# Patient Record
Sex: Male | Born: 1945 | Race: White | Hispanic: No | Smoking: Never smoker
Health system: Southern US, Community
[De-identification: ages and names within clinical notes are randomized; demographics above are authoritative.]

## PROBLEM LIST (undated history)

## (undated) DIAGNOSIS — K760 Fatty (change of) liver, not elsewhere classified: Secondary | ICD-10-CM

## (undated) DIAGNOSIS — E785 Hyperlipidemia, unspecified: Secondary | ICD-10-CM

## (undated) DIAGNOSIS — K5790 Diverticulosis of intestine, part unspecified, without perforation or abscess without bleeding: Secondary | ICD-10-CM

## (undated) DIAGNOSIS — K429 Umbilical hernia without obstruction or gangrene: Secondary | ICD-10-CM

## (undated) DIAGNOSIS — Z86010 Personal history of colonic polyps: Secondary | ICD-10-CM

## (undated) DIAGNOSIS — N281 Cyst of kidney, acquired: Secondary | ICD-10-CM

## (undated) DIAGNOSIS — I10 Essential (primary) hypertension: Secondary | ICD-10-CM

## (undated) DIAGNOSIS — M1990 Unspecified osteoarthritis, unspecified site: Secondary | ICD-10-CM

## (undated) DIAGNOSIS — I8390 Asymptomatic varicose veins of unspecified lower extremity: Secondary | ICD-10-CM

## (undated) HISTORY — DX: Umbilical hernia without obstruction or gangrene: K42.9

## (undated) HISTORY — DX: Essential (primary) hypertension: I10

## (undated) HISTORY — PX: APPENDECTOMY: SHX54

## (undated) HISTORY — PX: OTHER SURGICAL HISTORY: SHX169

## (undated) HISTORY — DX: Hyperlipidemia, unspecified: E78.5

## (undated) HISTORY — PX: WRIST SURGERY: SHX841

## (undated) HISTORY — DX: Asymptomatic varicose veins of unspecified lower extremity: I83.90

---

## 2013-06-02 ENCOUNTER — Other Ambulatory Visit: Payer: Self-pay | Admitting: *Deleted

## 2013-06-02 DIAGNOSIS — I83893 Varicose veins of bilateral lower extremities with other complications: Secondary | ICD-10-CM

## 2013-06-09 ENCOUNTER — Encounter: Payer: Self-pay | Admitting: Vascular Surgery

## 2013-07-20 ENCOUNTER — Encounter: Payer: Self-pay | Admitting: Vascular Surgery

## 2013-07-21 ENCOUNTER — Encounter: Payer: Medicare Other | Admitting: Vascular Surgery

## 2013-07-21 ENCOUNTER — Inpatient Hospital Stay (HOSPITAL_COMMUNITY): Admission: RE | Admit: 2013-07-21 | Payer: Medicare Other | Source: Ambulatory Visit

## 2014-03-15 ENCOUNTER — Encounter: Payer: Self-pay | Admitting: Gastroenterology

## 2014-04-11 ENCOUNTER — Ambulatory Visit (INDEPENDENT_AMBULATORY_CARE_PROVIDER_SITE_OTHER): Payer: Medicare Other | Admitting: Gastroenterology

## 2014-04-11 ENCOUNTER — Telehealth: Payer: Self-pay | Admitting: *Deleted

## 2014-04-11 ENCOUNTER — Encounter: Payer: Self-pay | Admitting: Gastroenterology

## 2014-04-11 VITALS — BP 110/76 | HR 88 | Ht 68.0 in | Wt 265.2 lb

## 2014-04-11 DIAGNOSIS — Z1211 Encounter for screening for malignant neoplasm of colon: Secondary | ICD-10-CM

## 2014-04-11 MED ORDER — MOVIPREP 100 G PO SOLR: 1.0000 | Freq: Once | ORAL | Status: DC

## 2014-04-11 NOTE — Progress Notes (Signed)
I agree with the note, plan above.  Colonoscopy for colon cancer screening.

## 2014-04-11 NOTE — Telephone Encounter (Signed)
Error

## 2014-04-11 NOTE — Progress Notes (Signed)
     04/11/2014 Shane Diaz [id number] 06-15-1946   HISTORY OF PRESENT ILLNESS:  This is a pleasant 68 year old male who was referred here by his PCP, Dr. Dagmar Hait, for colonoscopy.  He is from [place] and came to the [employer]. In 1984, but came to [facility] in 2011.  He denies any GI complaints.  Had one episode of constipation a couple of months ago, but that resolved and there have been no other issues since that time.  He has never undergone colonoscopy in the past.  Recent CBC and CMP were unremarkable.   Past Medical History  Diagnosis Date  . Varicose veins   . Hyperlipidemia   . Umbilical hernia    Past Surgical History  Procedure Laterality Date  . Varicose veins    . Wrist surgery      reports that he has never smoked. He has never used smokeless tobacco. He reports that he drinks alcohol. He reports that he does not use illicit drugs. family history is not on file. No Known Allergies    No outpatient encounter prescriptions on file as of 04/11/2014.     REVIEW OF SYSTEMS  : All other systems reviewed and negative except where noted in the History of Present Illness.   PHYSICAL EXAM: BP 110/76  Pulse 88  Ht 5\' 8"  (1.727 m)  Wt 265 lb 3.2 oz (120.294 kg)  BMI 40.33 kg/m2 General: Well developed white male in no acute distress Head: Normocephalic and atraumatic Eyes:  Sclerae anicteric, conjunctiva pink. Ears: Normal auditory acuity  Lungs: Clear throughout to auscultation Heart: Regular rate and rhythm Abdomen: Soft, non-distended.  Normal bowel sounds.  Non-tender.  Non-tender, reducible umbilical hernia noted. Rectal:  Deferred.  Will be done at the time of colonoscopy. Musculoskeletal: Symmetrical with no gross deformities  Skin: No lesions on visible extremities Extremities: No edema  Neurological: Alert oriented x 4, grossly non-focal Psychological:  Alert and cooperative. Normal mood and affect  ASSESSMENT AND PLAN: -Screening colonoscopy:  Will schedule  with Dr. Ardis Hughs.  The risks, benefits, and alternatives were discussed with the patient and he consents to proceed.

## 2014-04-11 NOTE — Addendum Note (Signed)
Addended by: Hope Pigeon A on: 04/11/2014 10:24 AM   Modules accepted: Orders

## 2014-04-11 NOTE — Patient Instructions (Signed)

## 2014-04-11 NOTE — Addendum Note (Signed)
Addended by: Hope Pigeon A on: 04/11/2014 09:53 AM   Modules accepted: Orders

## 2014-05-04 DIAGNOSIS — Z860100 Personal history of colon polyps, unspecified: Secondary | ICD-10-CM

## 2014-05-04 DIAGNOSIS — N281 Cyst of kidney, acquired: Secondary | ICD-10-CM

## 2014-05-04 HISTORY — DX: Personal history of colonic polyps: Z86.010

## 2014-05-04 HISTORY — DX: Cyst of kidney, acquired: N28.1

## 2014-05-04 HISTORY — DX: Personal history of colon polyps, unspecified: Z86.0100

## 2014-05-23 ENCOUNTER — Ambulatory Visit (AMBULATORY_SURGERY_CENTER): Payer: Medicare Other | Admitting: Gastroenterology

## 2014-05-23 ENCOUNTER — Encounter: Payer: Self-pay | Admitting: Gastroenterology

## 2014-05-23 VITALS — BP 162/103 | HR 78 | Temp 97.1°F | Resp 18 | Ht 68.0 in | Wt 265.0 lb

## 2014-05-23 DIAGNOSIS — D125 Benign neoplasm of sigmoid colon: Secondary | ICD-10-CM

## 2014-05-23 DIAGNOSIS — D122 Benign neoplasm of ascending colon: Secondary | ICD-10-CM

## 2014-05-23 DIAGNOSIS — Z1211 Encounter for screening for malignant neoplasm of colon: Secondary | ICD-10-CM

## 2014-05-23 HISTORY — PX: COLONOSCOPY W/ POLYPECTOMY: SHX1380

## 2014-05-23 MED ORDER — SODIUM CHLORIDE 0.9 % IV SOLN: 500.0000 mL | INTRAVENOUS | Status: DC

## 2014-05-23 NOTE — Op Note (Signed)
[facility]  [address] [place], [address]   COLONOSCOPY PROCEDURE REPORT  PATIENT: Shane Diaz, Shane Diaz  MR#: [id number] BIRTHDATE: 09/21/1945 , 68  yrs. old GENDER: male ENDOSCOPIST: Milus Banister, MD REFERRED OZ:[id number] Avva, M.D. PROCEDURE DATE:  05/23/2014 PROCEDURE:   Colonoscopy with snare polypectomy First Screening Colonoscopy - Avg.  risk and is 50 yrs.  old or older Yes.  Prior Negative Screening - Now for repeat screening. N/A  History of Adenoma - Now for follow-up colonoscopy & has been > or = to 3 yrs.  N/A  Polyps Removed Today? Yes. ASA CLASS:   Class II INDICATIONS:average risk for colon cancer. MEDICATIONS: Monitored anesthesia care and Propofol 230 mg IV  DESCRIPTION OF PROCEDURE:   After the risks benefits and alternatives of the procedure were thoroughly explained, informed consent was obtained.  The digital rectal exam revealed no abnormalities of the rectum.   The LB PFC-H190 [id number] and LB PFC-H190 [id number]  endoscope was introduced through the anus and advanced to the cecum, which was identified by both the appendix and ileocecal valve. No adverse events experienced.   The quality of the prep was excellent.  The instrument was then slowly withdrawn as the colon was fully examined.  COLON FINDINGS: A sessile polyp measuring 4 mm in size was found in the ascending colon.  A polypectomy was performed with a cold snare.  The resection was complete, the polyp tissue was completely retrieved and sent to histology (jar 1).   A pedunculated polyp measuring 15 mm in size was found in the sigmoid colon.  A polypectomy was performed using snare cautery.  The resection was complete, the polyp tissue was completely retrieved and sent to histology (jar 2).   There was mild diverticulosis noted in the left colon.   The examination was otherwise normal.  Retroflexed views revealed no abnormalities. The time to cecum=3 minutes 14 seconds.  Withdrawal  time=8 minutes 26 seconds.  The scope was withdrawn and the procedure completed. COMPLICATIONS: There were no immediate complications.  ENDOSCOPIC IMPRESSION: 1.   Sessile polyp was found in the ascending colon; polypectomy was performed with a cold snare 2.   Pedunculated polyp was found in the sigmoid colon; polypectomy was performed using snare cautery 3.   Mild diverticulosis was noted in the left colon 4.   The examination was otherwise normal  RECOMMENDATIONS: If the polyp(s) removed today are proven to be adenomatous (pre-cancerous) polyps, you will need a colonoscopy in 3 years. Otherwise you should continue to follow colorectal cancer screening guidelines for "routine risk" patients with a colonoscopy in 10 years.  You will receive a letter within 1-2 weeks with the results of your biopsy as well as final recommendations.  Please call my office if you have not received a letter after 3 weeks.  eSigned:  Milus Banister, MD 05/23/2014 3:37 PM

## 2014-05-23 NOTE — Progress Notes (Signed)
Procedure ends, to recovery, report given and VSS. 

## 2014-05-23 NOTE — Progress Notes (Signed)
Called to room to assist during endoscopic procedure.  Patient ID and intended procedure confirmed with present staff. Received instructions for my participation in the procedure from the performing physician.  

## 2014-05-23 NOTE — Patient Instructions (Signed)

## 2014-05-24 ENCOUNTER — Telehealth: Payer: Self-pay | Admitting: *Deleted

## 2014-05-24 NOTE — Telephone Encounter (Signed)
  Follow up Call-  Call back number 05/23/2014  Post procedure Call Back phone  # [phone] cell  Permission to leave phone message Yes     Patient questions:  Do you have a fever, pain , or abdominal swelling? No. Pain Score  0 *  Have you tolerated food without any problems? Yes.    Have you been able to return to your normal activities? Yes.    Do you have any questions about your discharge instructions: Diet   No. Medications  No. Follow up visit  No.  Do you have questions or concerns about your Care? No.  Actions: * If pain score is 4 or above: No action needed, pain <4.

## 2014-06-05 ENCOUNTER — Encounter: Payer: Self-pay | Admitting: Gastroenterology

## 2014-06-15 ENCOUNTER — Other Ambulatory Visit (HOSPITAL_COMMUNITY): Payer: Self-pay | Admitting: Internal Medicine

## 2014-06-15 ENCOUNTER — Ambulatory Visit (HOSPITAL_COMMUNITY)
Admission: RE | Admit: 2014-06-15 | Discharge: 2014-06-15 | Disposition: A | Discharge status: Home / Self Care | Payer: Medicare Other | Source: Ambulatory Visit | Attending: Internal Medicine | Admitting: Internal Medicine

## 2014-06-15 DIAGNOSIS — I803 Phlebitis and thrombophlebitis of lower extremities, unspecified: Secondary | ICD-10-CM | POA: Insufficient documentation

## 2014-06-15 DIAGNOSIS — M79661 Pain in right lower leg: Secondary | ICD-10-CM

## 2014-06-15 DIAGNOSIS — M7989 Other specified soft tissue disorders: Principal | ICD-10-CM

## 2014-06-15 DIAGNOSIS — I80209 Phlebitis and thrombophlebitis of unspecified deep vessels of unspecified lower extremity: Secondary | ICD-10-CM

## 2014-06-15 NOTE — Progress Notes (Signed)
VASCULAR LAB PRELIMINARY  PRELIMINARY  PRELIMINARY  PRELIMINARY  Bilateral lower extremity venous duplex  completed.    Preliminary report:  Right:  DVT noted in the distal FV, popliteal vein, PTV, and peroneal vein.  No evidence of superficial thrombosis.  No Baker's cyst.  Left:  No evidence of DVT, superficial thrombosis, or Baker's cyst.  Aiyla Baucom, RVT 06/15/2014, 3:15 PM

## 2015-02-08 ENCOUNTER — Other Ambulatory Visit: Payer: Self-pay | Admitting: *Deleted

## 2015-02-08 ENCOUNTER — Encounter: Payer: Self-pay | Admitting: Vascular Surgery

## 2015-02-08 DIAGNOSIS — I83893 Varicose veins of bilateral lower extremities with other complications: Secondary | ICD-10-CM

## 2015-02-22 ENCOUNTER — Encounter: Payer: Self-pay | Admitting: Vascular Surgery

## 2015-02-26 ENCOUNTER — Encounter: Payer: Self-pay | Admitting: Vascular Surgery

## 2015-02-26 ENCOUNTER — Ambulatory Visit (INDEPENDENT_AMBULATORY_CARE_PROVIDER_SITE_OTHER): Payer: Medicare Other | Admitting: Vascular Surgery

## 2015-02-26 ENCOUNTER — Ambulatory Visit (HOSPITAL_COMMUNITY)
Admission: RE | Admit: 2015-02-26 | Discharge: 2015-02-26 | Disposition: A | Discharge status: Home / Self Care | Payer: Medicare Other | Source: Ambulatory Visit | Attending: Vascular Surgery | Admitting: Vascular Surgery

## 2015-02-26 VITALS — BP 130/80 | HR 89 | Temp 97.5°F | Resp 18 | Ht 68.0 in | Wt 273.0 lb

## 2015-02-26 DIAGNOSIS — I83893 Varicose veins of bilateral lower extremities with other complications: Secondary | ICD-10-CM | POA: Insufficient documentation

## 2015-02-26 DIAGNOSIS — I83892 Varicose veins of left lower extremities with other complications: Secondary | ICD-10-CM

## 2015-02-26 NOTE — Progress Notes (Signed)
Subjective:     Patient ID: Shane Diaz, male   DOB: 03/18/1946, 69 y.o.   MRN: [id number]  HPI this 69 year old male is evaluated for varicose veins in both lower extremities with a recent episode of spontaneous bleeding in the left leg. This occurred about 4 months ago. He was in the bathroom when he spontaneously developed spurting blood from the left lateral ankle area. This did stop with elevation and compression. He has swelling in both ankles and feet as the day progresses left worse than right. He has no history of DVT thrombophlebitis stasis ulcers or pulmonary embolus. He does not wear elastic compression stockings.  Past Medical History  Diagnosis Date  . Varicose veins   . Umbilical hernia   . Hypertension   . Hyperlipidemia     pt denies    History  Substance Use Topics  . Smoking status: Never Smoker   . Smokeless tobacco: Never Used  . Alcohol Use: 0.0 oz/week    0 Standard drinks or equivalent per week     Comment: occassional wine, martini    Family History  Problem Relation Age of Onset  . Colon cancer Neg Hx   . Esophageal cancer Neg Hx   . Pancreatic cancer Neg Hx   . Prostate cancer Neg Hx   . Rectal cancer Neg Hx   . Stomach cancer Neg Hx     No Known Allergies   Current outpatient prescriptions:  .  olmesartan (BENICAR) 20 MG tablet, Take 20 mg by mouth daily., Disp: , Rfl:   Filed Vitals:   02/26/15 0958  BP: 130/80  Pulse: 89  Temp: 97.5 F (36.4 C)  Resp: 18  Height: 5\' 8"  (1.727 m)  Weight: 273 lb (123.832 kg)  SpO2: 95%    Body mass index is 41.52 kg/(m^2).           Review of Systems denies chest pain but does have mild dyspnea on exertion. Has chronic edema both legs. Other systems negative and complete review of systems     Objective:   Physical Exam BP 130/80 mmHg  Pulse 89  Temp(Src) 97.5 F (36.4 C)  Resp 18  Ht 5\' 8"  (1.727 m)  Wt 273 lb (123.832 kg)  BMI 41.52 kg/m2  SpO2 95%  Gen.-alert and oriented x3  in no apparent distress-obese HEENT normal for age Lungs no rhonchi or wheezing Cardiovascular regular rhythm no murmurs carotid pulses 3+ palpable no bruits audible Abdomen soft nontender no palpable masses Musculoskeletal free of  major deformities Skin clear -no rashes Neurologic normal Lower extremities 3+ femoral and dorsalis pedis pulses palpable bilaterally with 1+ edema bilaterally Diffuse reticular veins in both lower extremities lower third of leg medial and lateral malleolar area. Right leg has a few bulging varicosities in the medial calf and posterior calf. Early hyperpigmentation bilaterally with no active ulcer.  Today I ordered bilateral venous duplex exam Gen. reviewed and interpreted. There is no DVT. Left leg has gross reflux throughout the great saphenous vein with a large caliber vein. Right leg has no significant reflux in the great saphenous vein which was not visualized in the upper third of the thigh.       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 Patient has chronic edema which is affecting his daily living.    Plan:         #1 long leg elastic  compression stockings 20-30 mm gradient #2 elevate legs as much as possible #3 ibuprofen daily on a regular basis for pain #4 return in 3 months-if no significant improvement then he will need laser ablation left great saphenous vein plus sclerotherapy of the bleeding site If he has any further bleeding episodes he will notify us and we will proceed without the three-month waiting.

## 2015-02-27 NOTE — Progress Notes (Signed)
Pt came in on 7/26 insisting the compression stockings that I fit him for yesterday were too tight. I told him that they needed to be tight in order to provide the support that was necessary. I fit him in a large and am worried that the XL will fall down his leg. The patient could not be reasoned with. Even though we are not supposed to exchange stockings, I gave him a size XL because I knew he would make a scene if I told him the stockings would cost an additional $50. We will use the size larges for someone who can't afford stockings.

## 2015-05-05 DIAGNOSIS — K5790 Diverticulosis of intestine, part unspecified, without perforation or abscess without bleeding: Secondary | ICD-10-CM

## 2015-05-05 HISTORY — DX: Diverticulosis of intestine, part unspecified, without perforation or abscess without bleeding: K57.90

## 2015-05-08 ENCOUNTER — Other Ambulatory Visit: Payer: Self-pay | Admitting: Internal Medicine

## 2015-05-08 DIAGNOSIS — R945 Abnormal results of liver function studies: Secondary | ICD-10-CM

## 2015-05-08 DIAGNOSIS — R7989 Other specified abnormal findings of blood chemistry: Secondary | ICD-10-CM

## 2015-05-14 ENCOUNTER — Ambulatory Visit
Admission: RE | Admit: 2015-05-14 | Discharge: 2015-05-14 | Disposition: A | Discharge status: Home / Self Care | Payer: Medicare Other | Source: Ambulatory Visit | Attending: Internal Medicine | Admitting: Internal Medicine

## 2015-05-14 DIAGNOSIS — R7989 Other specified abnormal findings of blood chemistry: Secondary | ICD-10-CM

## 2015-05-14 DIAGNOSIS — R945 Abnormal results of liver function studies: Secondary | ICD-10-CM

## 2015-05-14 DIAGNOSIS — K760 Fatty (change of) liver, not elsewhere classified: Secondary | ICD-10-CM

## 2015-05-14 HISTORY — DX: Fatty (change of) liver, not elsewhere classified: K76.0

## 2015-05-25 ENCOUNTER — Encounter: Payer: Self-pay | Admitting: Vascular Surgery

## 2015-05-29 ENCOUNTER — Encounter: Payer: Self-pay | Admitting: Vascular Surgery

## 2015-05-29 ENCOUNTER — Ambulatory Visit (INDEPENDENT_AMBULATORY_CARE_PROVIDER_SITE_OTHER): Payer: Medicare Other | Admitting: Vascular Surgery

## 2015-05-29 VITALS — BP 136/84 | HR 85 | Ht 68.0 in | Wt 266.0 lb

## 2015-05-29 DIAGNOSIS — I83893 Varicose veins of bilateral lower extremities with other complications: Secondary | ICD-10-CM

## 2015-05-29 NOTE — Progress Notes (Signed)
Subjective:     Patient ID: Shane Diaz, male   DOB: 1946-08-03, 69 y.o.   MRN: [id number]  HPI this 69 year old male returns for continued follow-up regarding his severe venous disease bilaterally. He has had an episode of spontaneous bleeding in the left leg which resolved. He has pain occluding aching throbbing and burning discomfort in both legs which worsens as the day progresses. He has tried long-leg elastic compression stockings 20-30 millimeter gradient as well as elevation ibuprofen with no improvement. He has noted some darkness of the skin the lower third of both legs as well as the edema.  Past Medical History  Diagnosis Date  . Varicose veins   . Umbilical hernia   . Hypertension   . Hyperlipidemia     pt denies    Social History  Substance Use Topics  . Smoking status: Never Smoker   . Smokeless tobacco: Never Used  . Alcohol Use: 0.0 oz/week    0 Standard drinks or equivalent per week     Comment: occassional wine, martini    Family History  Problem Relation Age of Onset  . Colon cancer Neg Hx   . Esophageal cancer Neg Hx   . Pancreatic cancer Neg Hx   . Prostate cancer Neg Hx   . Rectal cancer Neg Hx   . Stomach cancer Neg Hx     No Known Allergies   Current outpatient prescriptions:  .  olmesartan (BENICAR) 20 MG tablet, Take 20 mg by mouth daily., Disp: , Rfl:   Filed Vitals:   05/29/15 1009  BP: 136/84  Pulse: 85  Height: 5\' 8"  (1.727 m)  Weight: 266 lb (120.657 kg)  SpO2: 95%    Body mass index is 40.45 kg/(m^2).           Review of Systems denies chest pain, dyspnea on exertion, PND, orthopnea, hemoptysis     Objective:   Physical Exam BP 136/84 mmHg  Pulse 85  Ht 5\' 8"  (1.727 m)  Wt 266 lb (120.657 kg)  BMI 40.45 kg/m2  SpO2 95%  Gen. well-developed well-nourished male in no apparent distress alert and oriented 3 Lungs no rhonchi or wheezing Left leg with chronic 1+ edema and early hyperpigmentation distally. Superficial  reticular veins over great saphenous system and small varicosities were bleeding occurred in mid calf Right leg with bulging varicosities in posterior thigh and calf with 1+ edema. 3  Patient has had previous stripping of right great saphenous vein. He has documented gross reflux throughout left great saphenous vein supplying the area of bleeding     Assessment:     Painful varicosities with episode of bleeding left leg and continued pain right calf. Symptoms are resistant to conservative measures including long-leg elastic compression stockings, elevation, and ibuprofen.    Plan:     Patient needs number-one laser ablation left great saphenous vein plus sclerotherapy of bleeding site #2 greater than 20 stab phlebectomy of painful varicosities right leg We'll proceed with precertification to perform this in the near future

## 2015-06-05 ENCOUNTER — Other Ambulatory Visit: Payer: Self-pay | Admitting: *Deleted

## 2015-06-05 DIAGNOSIS — I83812 Varicose veins of left lower extremities with pain: Secondary | ICD-10-CM

## 2015-06-14 ENCOUNTER — Encounter: Payer: Self-pay | Admitting: Vascular Surgery

## 2015-06-18 ENCOUNTER — Encounter: Payer: Self-pay | Admitting: Vascular Surgery

## 2015-06-18 ENCOUNTER — Ambulatory Visit (INDEPENDENT_AMBULATORY_CARE_PROVIDER_SITE_OTHER): Payer: Medicare Other | Admitting: Vascular Surgery

## 2015-06-18 VITALS — BP 129/84 | HR 91 | Temp 97.6°F | Resp 16 | Ht 68.0 in | Wt 260.0 lb

## 2015-06-18 DIAGNOSIS — I83892 Varicose veins of left lower extremities with other complications: Secondary | ICD-10-CM

## 2015-06-18 NOTE — Progress Notes (Signed)
Subjective:     Patient ID: Shane Diaz, male   DOB: 05-25-1946, 69 y.o.   MRN: [id number]  HPI This 69 year old male had laser ablation of left great saphenous vein from the distal thigh to near the saphenofemoral junction performed under local tumescent anesthesia. A total of 1700 J of energy was utilized. He tolerated the procedure well.  Review of Systems     Objective:   Physical Exam BP 129/84 mmHg  Pulse 91  Temp(Src) 97.6 F (36.4 C)  Resp 16  Ht 5\' 8"  (1.727 m)  Wt 260 lb (117.935 kg)  BMI 39.54 kg/m2  SpO2 99%       Assessment:     Well-tolerated laser ablation left great saphenous vein performed under local tumescent anesthesia for gross reflux with pain and swelling    Plan:     Return in 1 week for venous duplex exam to confirm closure left great saphenous vein

## 2015-06-18 NOTE — Progress Notes (Signed)
Laser Ablation Procedure    Date: 06/18/2015   Shane Diaz DOB:June 27, 1946  Consent signed: Yes    Surgeon:  Dr. Nelda Severe. Kellie Simmering  Procedure: Laser Ablation: left Greater Saphenous Vein  BP 129/84 mmHg  Pulse 91  Temp(Src) 97.6 F (36.4 C)  Resp 16  Ht 5\' 8"  (1.727 m)  Wt 260 lb (117.935 kg)  BMI 39.54 kg/m2  SpO2 99%  Tumescent Anesthesia: 300 cc 0.9% NaCl with 50 cc Lidocaine HCL with 1% Epi and 15 cc 8.4% NaHCO3  Local Anesthesia: 4 cc Lidocaine HCL and NaHCO3 (ratio 2:1)  Pulsed Mode: 15 watts, 532ms delay, 1.0 duration  Total Energy:1751              Total Pulses:   117             Total Time: 1:57    Patient tolerated procedure well  Notes:   Description of Procedure:  After marking the course of the secondary varicosities, the patient was placed on the operating table in the supine position, and the left leg was prepped and draped in sterile fashion.   Local anesthetic was administered and under ultrasound guidance the saphenous vein was accessed with a micro needle and guide wire; then the mirco puncture sheath was placed.  A guide wire was inserted saphenofemoral junction , followed by a 5 french sheath.  The position of the sheath and then the laser fiber below the junction was confirmed using the ultrasound.  Tumescent anesthesia was administered along the course of the saphenous vein using ultrasound guidance. The patient was placed in Trendelenburg position and protective laser glasses were placed on patient and staff, and the laser was fired at 15 watts continuous mode advancing 1-69mm/second for a total of 1751 joules.     Steri strips were applied to the stab wounds and ABD pads and thigh high compression stockings were applied.  Ace wrap bandages were applied over the phlebectomy sites and at the top of the saphenofemoral junction. Blood loss was less than 15 cc.  The patient ambulated out of the operating room having tolerated the procedure well.

## 2015-06-19 ENCOUNTER — Telehealth: Payer: Self-pay | Admitting: *Deleted

## 2015-06-19 NOTE — Telephone Encounter (Signed)
Pt doing well. Not having any pain. Following all instructions.

## 2015-06-21 ENCOUNTER — Encounter: Payer: Self-pay | Admitting: Vascular Surgery

## 2015-06-25 ENCOUNTER — Encounter: Payer: Self-pay | Admitting: Vascular Surgery

## 2015-06-25 ENCOUNTER — Ambulatory Visit (HOSPITAL_COMMUNITY)
Admission: RE | Admit: 2015-06-25 | Discharge: 2015-06-25 | Disposition: A | Discharge status: Home / Self Care | Payer: Medicare Other | Source: Ambulatory Visit | Attending: Vascular Surgery | Admitting: Vascular Surgery

## 2015-06-25 ENCOUNTER — Ambulatory Visit (INDEPENDENT_AMBULATORY_CARE_PROVIDER_SITE_OTHER): Payer: Medicare Other | Admitting: Vascular Surgery

## 2015-06-25 VITALS — BP 127/80 | HR 87 | Ht 68.0 in | Wt 266.0 lb

## 2015-06-25 DIAGNOSIS — I83892 Varicose veins of left lower extremities with other complications: Secondary | ICD-10-CM | POA: Diagnosis not present

## 2015-06-25 DIAGNOSIS — I83812 Varicose veins of left lower extremities with pain: Secondary | ICD-10-CM | POA: Diagnosis present

## 2015-06-25 DIAGNOSIS — I83899 Varicose veins of unspecified lower extremities with other complications: Secondary | ICD-10-CM | POA: Insufficient documentation

## 2015-06-25 NOTE — Progress Notes (Signed)
Subjective:     Patient ID: Shane Diaz, male   DOB: [birth year], 69 y.o.   MRN: [id number]  HPI this 69 year old male returns 1 week post-laser ablation left great saphenous vein for gross reflux with pain and swelling. He has had some mild-to-moderate discomfort in the mid to proximal medial thigh where the great saphenous vein is located. This is resolving. He has had some itching in this area. He has had no distal edema since the procedure. He denies any chest pain.  Past Medical History  Diagnosis Date  . Varicose veins   . Umbilical hernia   . Hypertension   . Hyperlipidemia     pt denies    Social History  Substance Use Topics  . Smoking status: Never Smoker   . Smokeless tobacco: Never Used  . Alcohol Use: 0.0 oz/week    0 Standard drinks or equivalent per week     Comment: occassional wine, martini    Family History  Problem Relation Age of Onset  . Colon cancer Neg Hx   . Esophageal cancer Neg Hx   . Pancreatic cancer Neg Hx   . Prostate cancer Neg Hx   . Rectal cancer Neg Hx   . Stomach cancer Neg Hx     No Known Allergies   Current outpatient prescriptions:  .  olmesartan (BENICAR) 20 MG tablet, Take 20 mg by mouth daily., Disp: , Rfl:   Filed Vitals:   06/25/15 0946  BP: 127/80  Pulse: 87  Height: 5\' 8"  (1.727 m)  Weight: 266 lb (120.657 kg)  SpO2: 94%    Body mass index is 40.45 kg/(m^2).           Review of Systems denies chest pain, dyspnea on exertion, PND, orthopnea, claudication, hemoptysis.     Objective:   Physical Exam BP 127/80 mmHg  Pulse 87  Ht 5\' 8"  (1.727 m)  Wt 266 lb (120.657 kg)  BMI 40.45 kg/m2  SpO2 94%  Gen. well-developed well-nourished male in no apparent distress alert and oriented 3 Lungs no rhonchi or wheezing Cardiovascular regular rhythm no murmurs Left leg with mild discomfort to deep palpation of her great saphenous vein from lower third of the thigh to near the saphenofemoral junction. Prominent  reticular veins and spider veins in distal thigh and medial calf area. No hyperpigmentation or ulceration noted. 3+ dorsalis pedis pulse palpable.  Today I ordered a venous duplex exam of the left leg which I reviewed and interpreted. There is no DVT. The left great saphenous vein is totally closed from the distal thigh to near the saphenofemoral junction.       Assessment:     Successful closure left great saphenous vein for gross reflux with history of bleeding and lower thigh     Plan:     Patient to return soon for 1 course of sclerotherapy left distal thigh Patient to return next week for multiple stab phlebectomy of painful varicosities right leg and thigh and calf area.

## 2015-06-26 ENCOUNTER — Encounter: Payer: Self-pay | Admitting: Vascular Surgery

## 2015-07-02 ENCOUNTER — Encounter: Payer: Self-pay | Admitting: Vascular Surgery

## 2015-07-02 ENCOUNTER — Other Ambulatory Visit: Payer: Medicare Other | Admitting: Vascular Surgery

## 2015-07-02 ENCOUNTER — Ambulatory Visit (INDEPENDENT_AMBULATORY_CARE_PROVIDER_SITE_OTHER): Payer: Medicare Other | Admitting: Vascular Surgery

## 2015-07-02 VITALS — BP 130/80 | HR 71 | Temp 97.6°F | Resp 16 | Ht 68.0 in | Wt 259.0 lb

## 2015-07-02 DIAGNOSIS — I83893 Varicose veins of bilateral lower extremities with other complications: Secondary | ICD-10-CM

## 2015-07-02 NOTE — Progress Notes (Signed)
    Stab Phlebectomy Procedure  Eastyn Evanoff DOB:1945-10-18  07/02/2015  Consent signed: Yes  Surgeon:J.D. Kellie Simmering  Procedure: stab phlebectomy: right leg  BP 130/80 mmHg  Pulse 71  Temp(Src) 97.6 F (36.4 C)  Resp 16  Ht 5\' 8"  (1.727 m)  Wt 259 lb (117.482 kg)  BMI 39.39 kg/m2  SpO2 98%  Start time: 11:15   End time: 12:15   Tumescent Anesthesia: 175 cc 0.9% NaCl with 50 cc Lidocaine HCL with 1% Epi and 15 cc 8.4% NaHCO3  Local Anesthesia: 7 cc Lidocaine HCL and NaHCO3 (ratio 2:1)    Stab Phlebectomy: >20 Sites: Thigh, Calf and Ankle  Patient tolerated procedure well: Yes  Notes:   Description of Procedure:  After marking the course of the secondary varicosities, the patient was placed on the operating table in the supine position, and the right leg was prepped and draped in sterile fashion.    The patient was then put into Trendelenburg position.  Local anesthetic was administered at the previously marked varicosities, and tumescent anesthesia was administered around the vessels.  Greater than 20 stab wounds were made using the tip of an 11 blade. And using the vein hook, the phlebectomies were performed using a hemostat to avulse the varicosities.  Adequate hemostasis was achieved, and steri strips were applied to the stab wound.      ABD pads and thigh high compression stockings were applied as well ace wraps where needed. Blood loss was less than 15 cc.  The patient ambulated out of the operating room having tolerated the procedure well.

## 2015-07-02 NOTE — Progress Notes (Signed)
Subjective:     Patient ID: Shane Diaz, male   DOB: [birth year], 69 y.o.   MRN: [id number]  HPI  This 69 year old male had multiple stab phlebectomy-greater than 20 of painful varicosities in the right thigh and calf area performed under local tumescent anesthesia. He tolerated the procedure well.  Review of Systems     Objective:   Physical Exam BP 130/80 mmHg  Pulse 71  Temp(Src) 97.6 F (36.4 C)  Resp 16  Ht 5\' 8"  (1.727 m)  Wt 259 lb (117.482 kg)  BMI 39.39 kg/m2  SpO2 98%       Assessment:       Well tolerated multiple stab phlebectomy of painful varicosities right leg performed under local tumescent anesthesia     Plan:      patient will return in a few weeks for sclerotherapy to conclude his treatment regimen

## 2015-07-03 ENCOUNTER — Telehealth: Payer: Self-pay | Admitting: *Deleted

## 2015-07-03 NOTE — Telephone Encounter (Signed)
Pt doing well. No bleeding. Following all instructions. Will see him for sclero in a few weeks.

## 2015-07-09 ENCOUNTER — Ambulatory Visit: Payer: Medicare Other | Admitting: Vascular Surgery

## 2015-07-09 ENCOUNTER — Encounter (HOSPITAL_COMMUNITY): Payer: Medicare Other

## 2015-07-20 ENCOUNTER — Encounter: Payer: Self-pay | Admitting: *Deleted

## 2015-07-25 ENCOUNTER — Ambulatory Visit (INDEPENDENT_AMBULATORY_CARE_PROVIDER_SITE_OTHER): Payer: Medicare Other | Admitting: *Deleted

## 2015-07-25 DIAGNOSIS — I83893 Varicose veins of bilateral lower extremities with other complications: Secondary | ICD-10-CM

## 2015-07-25 NOTE — Progress Notes (Signed)
X=.3% 12 Sotradecol administered with a 27g butterfly.  Patient received a total of 6cc.  Treated all areas of concern for this pt who has had bleeding from his spider veins. Easy access. Tol well. Will follow prn.  Photos: No.  Compression stockings applied: Yes.

## 2015-07-26 ENCOUNTER — Encounter: Payer: Self-pay | Admitting: Vascular Surgery

## 2015-09-05 ENCOUNTER — Encounter: Payer: Self-pay | Admitting: Internal Medicine

## 2017-06-08 ENCOUNTER — Encounter: Payer: Self-pay | Admitting: Gastroenterology

## 2018-01-25 ENCOUNTER — Other Ambulatory Visit: Payer: Self-pay | Admitting: Internal Medicine

## 2018-01-25 ENCOUNTER — Ambulatory Visit
Admission: RE | Admit: 2018-01-25 | Discharge: 2018-01-25 | Disposition: A | Discharge status: Home / Self Care | Payer: Medicare Other | Source: Ambulatory Visit | Attending: Internal Medicine | Admitting: Internal Medicine

## 2018-01-25 DIAGNOSIS — Z86718 Personal history of other venous thrombosis and embolism: Secondary | ICD-10-CM

## 2018-01-25 DIAGNOSIS — R0602 Shortness of breath: Secondary | ICD-10-CM

## 2018-01-25 DIAGNOSIS — R7989 Other specified abnormal findings of blood chemistry: Secondary | ICD-10-CM

## 2018-04-13 ENCOUNTER — Encounter (HOSPITAL_COMMUNITY): Payer: Self-pay | Admitting: *Deleted

## 2018-04-15 ENCOUNTER — Other Ambulatory Visit: Payer: Self-pay | Admitting: Surgery

## 2018-04-19 ENCOUNTER — Encounter (HOSPITAL_COMMUNITY): Payer: Self-pay

## 2018-04-19 NOTE — Patient Instructions (Signed)
Your procedure is scheduled on: Tuesday, Sept. 24, 2019   Surgery Time:  10:00AM-11:30AM   Report to [place]  Entrance    Report to admitting at AM   Call this number if you have problems the morning of surgery [phone]   Do not eat food:After Midnight.   Brush your teeth the morning of surgery.   Do NOT smoke after Midnight   May have liquids until 7:00AM day of surgery  CLEAR LIQUID DIET   Foods Allowed                                                                     Foods Excluded  Coffee and tea, regular and decaf                             liquids that you cannot  Plain Jell-O in any flavor                                             see through such as: Fruit ices (not with fruit pulp)                                     milk, soups, orange juice  Iced Popsicles                                    All solid food Carbonated beverages, regular and diet                                    Cranberry, grape and apple juices Sports drinks like Gatorade Lightly seasoned clear broth or consume(fat free) Sugar, honey syrup  Sample Menu Breakfast                                Lunch                                     Supper Cranberry juice                    Beef broth                            Chicken broth Jell-O                                     Grape juice                           Apple juice Coffee or tea  Jell-O                                      Popsicle                                                Coffee or tea                        Coffee or tea   Complete one Ensure drink the morning of surgery by  7:00AM the day of surgery.   Take these medicines the morning of surgery with A SIP OF WATER: None                               You may not have any metal on your body including hair pins, jewelry, and body piercings             Do not wear make-up, lotions, powders, perfumes/cologne, or deodorant  Do not wear nail polish.  Do not shave  48 hours prior to surgery.                Do not bring valuables to the hospital. [place].   Contacts, dentures or bridgework may not be worn into surgery.    Patients discharged the day of surgery will not be allowed to drive home.   Special Instructions: Bring a copy of your healthcare power of attorney and living will documents         the day of surgery if you haven't scanned them in before.              Please read over the following fact sheets you were given:  [facility] - Preparing for Surgery Before surgery, you can play an important role.  Because skin is not sterile, your skin needs to be as free of germs as possible.  You can reduce the number of germs on your skin by washing with CHG (chlorahexidine gluconate) soap before surgery.  CHG is an antiseptic cleaner which kills germs and bonds with the skin to continue killing germs even after washing. Please DO NOT use if you have an allergy to CHG or antibacterial soaps.  If your skin becomes reddened/irritated stop using the CHG and inform your nurse when you arrive at Short Stay. Do not shave (including legs and underarms) for at least 48 hours prior to the first CHG shower.  You may shave your face/neck.  Please follow these instructions carefully:  1.  Shower with CHG Soap the night before surgery and the  morning of surgery.  2.  If you choose to wash your hair, wash your hair first as usual with your normal  shampoo.  3.  After you shampoo, rinse your hair and body thoroughly to remove the shampoo.                             4.  Use CHG as you would any other liquid soap.  You can apply chg directly to the skin and  wash.  Gently with a scrungie or clean washcloth.  5.  Apply the CHG Soap to your body ONLY FROM THE NECK DOWN.   Do   not use on face/ open                           Wound or open sores. Avoid contact with eyes, ears mouth  and   genitals (private parts).                       Wash face,  Genitals (private parts) with your normal soap.             6.  Wash thoroughly, paying special attention to the area where your    surgery  will be performed.  7.  Thoroughly rinse your body with warm water from the neck down.  8.  DO NOT shower/wash with your normal soap after using and rinsing off the CHG Soap.                9.  Pat yourself dry with a clean towel.            10.  Wear clean pajamas.            11.  Place clean sheets on your bed the night of your first shower and do not  sleep with pets. Day of Surgery : Do not apply any lotions/deodorants the morning of surgery.  Please wear clean clothes to the hospital/surgery center.  FAILURE TO FOLLOW THESE INSTRUCTIONS MAY RESULT IN THE CANCELLATION OF YOUR SURGERY  PATIENT SIGNATURE_________________________________  NURSE SIGNATURE__________________________________  ________________________________________________________________________

## 2018-04-20 ENCOUNTER — Encounter (HOSPITAL_COMMUNITY)
Admission: RE | Admit: 2018-04-20 | Discharge: 2018-04-20 | Disposition: A | Discharge status: Home / Self Care | Payer: Medicare Other | Source: Ambulatory Visit | Attending: Surgery | Admitting: Surgery

## 2018-04-20 ENCOUNTER — Other Ambulatory Visit: Payer: Self-pay

## 2018-04-20 ENCOUNTER — Encounter (HOSPITAL_COMMUNITY): Payer: Self-pay

## 2018-04-20 DIAGNOSIS — Z01818 Encounter for other preprocedural examination: Secondary | ICD-10-CM | POA: Diagnosis not present

## 2018-04-20 HISTORY — DX: Diverticulosis of intestine, part unspecified, without perforation or abscess without bleeding: K57.90

## 2018-04-20 HISTORY — DX: Unspecified osteoarthritis, unspecified site: M19.90

## 2018-04-20 HISTORY — DX: Cyst of kidney, acquired: N28.1

## 2018-04-20 HISTORY — DX: Personal history of colonic polyps: Z86.010

## 2018-04-20 HISTORY — DX: Fatty (change of) liver, not elsewhere classified: K76.0

## 2018-04-20 LAB — CBC
HCT: 53.9 % — ABNORMAL HIGH (ref 39.0–52.0)
HEMOGLOBIN: 17.9 g/dL — AB (ref 13.0–17.0)
MCH: 35.2 pg — ABNORMAL HIGH (ref 26.0–34.0)
MCHC: 33.2 g/dL (ref 30.0–36.0)
MCV: 106.1 fL — ABNORMAL HIGH (ref 78.0–100.0)
Platelets: 160 10*3/uL (ref 150–400)
RBC: 5.08 MIL/uL (ref 4.22–5.81)
RDW: 13.9 % (ref 11.5–15.5)
WBC: 8.3 10*3/uL (ref 4.0–10.5)

## 2018-04-20 LAB — BASIC METABOLIC PANEL
ANION GAP: 11 (ref 5–15)
BUN: 16 mg/dL (ref 8–23)
CALCIUM: 10.1 mg/dL (ref 8.9–10.3)
CO2: 30 mmol/L (ref 22–32)
Chloride: 99 mmol/L (ref 98–111)
Creatinine, Ser: 1.26 mg/dL — ABNORMAL HIGH (ref 0.61–1.24)
GFR calc non Af Amer: 55 mL/min — ABNORMAL LOW (ref >60)
GLUCOSE: 132 mg/dL — AB (ref 70–99)
POTASSIUM: 4.3 mmol/L (ref 3.5–5.1)
Sodium: 140 mmol/L (ref 135–145)

## 2018-04-20 NOTE — Pre-Procedure Instructions (Signed)
EKG 05/26/2017 in chart

## 2018-04-20 NOTE — Pre-Procedure Instructions (Signed)
CBC and BMP results 04/20/2018 faxed to Dr. Keturah Barre. Newman via epic.

## 2018-04-26 MED ORDER — CEFAZOLIN SODIUM 10 G IJ SOLR
3.0000 g | INTRAMUSCULAR | Status: AC
  Administered 2018-04-27: 3 g via INTRAVENOUS
  Filled 2018-04-26: qty 3

## 2018-04-26 MED ORDER — DEXTROSE 5 % IV SOLN
3.0000 g | INTRAVENOUS | Status: DC
  Filled 2018-04-26: qty 3000

## 2018-04-26 MED ORDER — BUPIVACAINE LIPOSOME 1.3 % IJ SUSP
20.0000 mL | Freq: Once | INTRAMUSCULAR | Status: DC
  Filled 2018-04-26: qty 20

## 2018-04-27 ENCOUNTER — Ambulatory Visit (HOSPITAL_COMMUNITY)
Admission: RE | Admit: 2018-04-27 | Discharge: 2018-04-27 | Disposition: A | Discharge status: Home / Self Care | Payer: Medicare Other | Source: Ambulatory Visit | Attending: Surgery | Admitting: Surgery

## 2018-04-27 ENCOUNTER — Encounter (HOSPITAL_COMMUNITY): Payer: Self-pay

## 2018-04-27 ENCOUNTER — Encounter (HOSPITAL_COMMUNITY)
Admission: RE | Disposition: A | Discharge status: Home / Self Care | Payer: Self-pay | Source: Ambulatory Visit | Attending: Surgery

## 2018-04-27 ENCOUNTER — Ambulatory Visit (HOSPITAL_COMMUNITY): Payer: Medicare Other | Admitting: Anesthesiology

## 2018-04-27 DIAGNOSIS — Z6841 Body Mass Index (BMI) 40.0 and over, adult: Secondary | ICD-10-CM | POA: Insufficient documentation

## 2018-04-27 DIAGNOSIS — I10 Essential (primary) hypertension: Secondary | ICD-10-CM | POA: Diagnosis not present

## 2018-04-27 DIAGNOSIS — K429 Umbilical hernia without obstruction or gangrene: Secondary | ICD-10-CM | POA: Insufficient documentation

## 2018-04-27 HISTORY — PX: UMBILICAL HERNIA REPAIR: SHX196

## 2018-04-27 SURGERY — REPAIR, HERNIA, UMBILICAL, ADULT
Anesthesia: General

## 2018-04-27 MED ORDER — DEXAMETHASONE SODIUM PHOSPHATE 10 MG/ML IJ SOLN
INTRAMUSCULAR | Status: AC
  Filled 2018-04-27: qty 1

## 2018-04-27 MED ORDER — LIDOCAINE 2% (20 MG/ML) 5 ML SYRINGE
INTRAMUSCULAR | Status: AC
  Filled 2018-04-27: qty 5

## 2018-04-27 MED ORDER — DEXAMETHASONE SODIUM PHOSPHATE 10 MG/ML IJ SOLN
INTRAMUSCULAR | Status: DC | PRN
  Administered 2018-04-27: 7 mg via INTRAVENOUS

## 2018-04-27 MED ORDER — EPHEDRINE 5 MG/ML INJ
INTRAVENOUS | Status: AC
  Filled 2018-04-27: qty 10

## 2018-04-27 MED ORDER — PHENYLEPHRINE HCL 10 MG/ML IJ SOLN
INTRAMUSCULAR | Status: AC
  Filled 2018-04-27: qty 1

## 2018-04-27 MED ORDER — ALBUTEROL SULFATE HFA 108 (90 BASE) MCG/ACT IN AERS
INHALATION_SPRAY | RESPIRATORY_TRACT | Status: DC | PRN
  Administered 2018-04-27: 5 via RESPIRATORY_TRACT

## 2018-04-27 MED ORDER — OXYCODONE HCL 5 MG PO TABS: 5.0000 mg | ORAL_TABLET | Freq: Once | ORAL | Status: DC | PRN

## 2018-04-27 MED ORDER — HYDROCODONE-ACETAMINOPHEN 5-325 MG PO TABS: 1.0000 | ORAL_TABLET | Freq: Four times a day (QID) | ORAL | 0 refills | Status: DC | PRN

## 2018-04-27 MED ORDER — PHENYLEPHRINE 40 MCG/ML (10ML) SYRINGE FOR IV PUSH (FOR BLOOD PRESSURE SUPPORT)
PREFILLED_SYRINGE | INTRAVENOUS | Status: DC | PRN
  Administered 2018-04-27 (×4): 80 ug via INTRAVENOUS

## 2018-04-27 MED ORDER — PHENYLEPHRINE 40 MCG/ML (10ML) SYRINGE FOR IV PUSH (FOR BLOOD PRESSURE SUPPORT)
PREFILLED_SYRINGE | INTRAVENOUS | Status: AC
  Filled 2018-04-27: qty 10

## 2018-04-27 MED ORDER — FENTANYL CITRATE (PF) 100 MCG/2ML IJ SOLN
INTRAMUSCULAR | Status: AC
  Filled 2018-04-27: qty 2

## 2018-04-27 MED ORDER — PROMETHAZINE HCL 25 MG/ML IJ SOLN: 6.2500 mg | INTRAMUSCULAR | Status: DC | PRN

## 2018-04-27 MED ORDER — PROPOFOL 10 MG/ML IV BOLUS
INTRAVENOUS | Status: DC | PRN
  Administered 2018-04-27: 20 mg via INTRAVENOUS
  Administered 2018-04-27: 200 mg via INTRAVENOUS

## 2018-04-27 MED ORDER — ONDANSETRON HCL 4 MG/2ML IJ SOLN
INTRAMUSCULAR | Status: DC | PRN
  Administered 2018-04-27: 4 mg via INTRAVENOUS

## 2018-04-27 MED ORDER — PROPOFOL 10 MG/ML IV BOLUS
INTRAVENOUS | Status: AC
  Filled 2018-04-27: qty 20

## 2018-04-27 MED ORDER — LACTATED RINGERS IV SOLN
INTRAVENOUS | Status: DC
  Administered 2018-04-27: 1000 mL via INTRAVENOUS

## 2018-04-27 MED ORDER — PHENYLEPHRINE HCL 10 MG/ML IJ SOLN
INTRAVENOUS | Status: DC | PRN
  Administered 2018-04-27: 25 ug/min via INTRAVENOUS

## 2018-04-27 MED ORDER — SUGAMMADEX SODIUM 200 MG/2ML IV SOLN
INTRAVENOUS | Status: DC | PRN
  Administered 2018-04-27: 300 mg via INTRAVENOUS

## 2018-04-27 MED ORDER — BUPIVACAINE HCL 0.25 % IJ SOLN
INTRAMUSCULAR | Status: AC
  Filled 2018-04-27: qty 1

## 2018-04-27 MED ORDER — ONDANSETRON HCL 4 MG/2ML IJ SOLN
INTRAMUSCULAR | Status: AC
  Filled 2018-04-27: qty 2

## 2018-04-27 MED ORDER — ROCURONIUM BROMIDE 50 MG/5ML IV SOSY
PREFILLED_SYRINGE | INTRAVENOUS | Status: DC | PRN
  Administered 2018-04-27: 30 mg via INTRAVENOUS

## 2018-04-27 MED ORDER — GABAPENTIN 300 MG PO CAPS
300.0000 mg | ORAL_CAPSULE | ORAL | Status: AC
  Administered 2018-04-27: 300 mg via ORAL
  Filled 2018-04-27: qty 1

## 2018-04-27 MED ORDER — OXYCODONE HCL 5 MG/5ML PO SOLN
5.0000 mg | Freq: Once | ORAL | Status: DC | PRN
  Filled 2018-04-27: qty 5

## 2018-04-27 MED ORDER — SUCCINYLCHOLINE CHLORIDE 200 MG/10ML IV SOSY
PREFILLED_SYRINGE | INTRAVENOUS | Status: DC | PRN
  Administered 2018-04-27: 140 mg via INTRAVENOUS

## 2018-04-27 MED ORDER — FENTANYL CITRATE (PF) 100 MCG/2ML IJ SOLN
INTRAMUSCULAR | Status: DC | PRN
  Administered 2018-04-27: 50 ug via INTRAVENOUS

## 2018-04-27 MED ORDER — SUCCINYLCHOLINE CHLORIDE 200 MG/10ML IV SOSY
PREFILLED_SYRINGE | INTRAVENOUS | Status: AC
  Filled 2018-04-27: qty 10

## 2018-04-27 MED ORDER — ACETAMINOPHEN 500 MG PO TABS
1000.0000 mg | ORAL_TABLET | ORAL | Status: AC
  Administered 2018-04-27: 1000 mg via ORAL
  Filled 2018-04-27: qty 2

## 2018-04-27 MED ORDER — CHLORHEXIDINE GLUCONATE CLOTH 2 % EX PADS: 6.0000 | MEDICATED_PAD | Freq: Once | CUTANEOUS | Status: DC

## 2018-04-27 MED ORDER — EPHEDRINE SULFATE-NACL 50-0.9 MG/10ML-% IV SOSY
PREFILLED_SYRINGE | INTRAVENOUS | Status: DC | PRN
  Administered 2018-04-27 (×3): 10 mg via INTRAVENOUS

## 2018-04-27 MED ORDER — FENTANYL CITRATE (PF) 100 MCG/2ML IJ SOLN: 25.0000 ug | INTRAMUSCULAR | Status: DC | PRN

## 2018-04-27 MED ORDER — KETOROLAC TROMETHAMINE 30 MG/ML IJ SOLN: 15.0000 mg | Freq: Once | INTRAMUSCULAR | Status: AC | PRN

## 2018-04-27 MED ORDER — BUPIVACAINE HCL (PF) 0.25 % IJ SOLN
INTRAMUSCULAR | Status: DC | PRN
  Administered 2018-04-27: 30 mL

## 2018-04-27 MED ORDER — 0.9 % SODIUM CHLORIDE (POUR BTL) OPTIME
TOPICAL | Status: DC | PRN
  Administered 2018-04-27: 1000 mL

## 2018-04-27 MED ORDER — LIDOCAINE 2% (20 MG/ML) 5 ML SYRINGE
INTRAMUSCULAR | Status: DC | PRN
  Administered 2018-04-27: 100 mg via INTRAVENOUS

## 2018-04-27 MED ORDER — SUGAMMADEX SODIUM 500 MG/5ML IV SOLN
INTRAVENOUS | Status: AC
  Filled 2018-04-27: qty 5

## 2018-04-27 SURGICAL SUPPLY — 44 items
BENZOIN TINCTURE PRP APPL 2/3 (GAUZE/BANDAGES/DRESSINGS) ×3 IMPLANT
BINDER ABDOMINAL 12 ML 46-62 (SOFTGOODS) ×3 IMPLANT
BLADE HEX COATED 2.75 (ELECTRODE) ×3 IMPLANT
BLADE SURG 15 STRL LF DISP TIS (BLADE) ×1 IMPLANT
BLADE SURG 15 STRL SS (BLADE) ×2
BLADE SURG SZ10 CARB STEEL (BLADE) ×3 IMPLANT
CLOSURE WOUND 1/2 X4 (GAUZE/BANDAGES/DRESSINGS) ×1
COVER SURGICAL LIGHT HANDLE (MISCELLANEOUS) ×3 IMPLANT
DECANTER SPIKE VIAL GLASS SM (MISCELLANEOUS) ×3 IMPLANT
DERMABOND ADVANCED (GAUZE/BANDAGES/DRESSINGS) ×2
DERMABOND ADVANCED .7 DNX12 (GAUZE/BANDAGES/DRESSINGS) ×1 IMPLANT
DRAPE LAPAROTOMY T 102X78X121 (DRAPES) ×3 IMPLANT
DRSG TEGADERM 4X4.75 (GAUZE/BANDAGES/DRESSINGS) ×3 IMPLANT
ELECT PENCIL ROCKER SW 15FT (MISCELLANEOUS) ×3 IMPLANT
ELECT REM PT RETURN 15FT ADLT (MISCELLANEOUS) ×3 IMPLANT
GAUZE SPONGE 4X4 12PLY STRL (GAUZE/BANDAGES/DRESSINGS) ×3 IMPLANT
GLOVE BIOGEL PI IND STRL 7.0 (GLOVE) ×1 IMPLANT
GLOVE BIOGEL PI INDICATOR 7.0 (GLOVE) ×2
GLOVE SURG SIGNA 7.5 PF LTX (GLOVE) ×3 IMPLANT
GOWN SPEC L4 XLG W/TWL (GOWN DISPOSABLE) ×3 IMPLANT
GOWN STRL REUS W/ TWL XL LVL3 (GOWN DISPOSABLE) ×3 IMPLANT
GOWN STRL REUS W/TWL LRG LVL3 (GOWN DISPOSABLE) ×3 IMPLANT
GOWN STRL REUS W/TWL XL LVL3 (GOWN DISPOSABLE) ×6
KIT BASIN OR (CUSTOM PROCEDURE TRAY) ×3 IMPLANT
NEEDLE HYPO 22GX1.5 SAFETY (NEEDLE) ×3 IMPLANT
NS IRRIG 1000ML POUR BTL (IV SOLUTION) ×3 IMPLANT
PACK BASIC VI WITH GOWN DISP (CUSTOM PROCEDURE TRAY) ×3 IMPLANT
SOL PREP POV-IOD 4OZ 10% (MISCELLANEOUS) ×3 IMPLANT
SPONGE LAP 4X18 RFD (DISPOSABLE) ×6 IMPLANT
STRIP CLOSURE SKIN 1/2X4 (GAUZE/BANDAGES/DRESSINGS) ×2 IMPLANT
SUT MNCRL AB 4-0 PS2 18 (SUTURE) ×3 IMPLANT
SUT NOVA 0 T19/GS 22DT (SUTURE) ×6 IMPLANT
SUT NOVA NAB DX-16 0-1 5-0 T12 (SUTURE) IMPLANT
SUT PROLENE 0 CT 2 (SUTURE) ×3 IMPLANT
SUT VIC AB 3-0 SH 18 (SUTURE) ×3 IMPLANT
SUT VIC AB 4-0 PS2 27 (SUTURE) IMPLANT
SUT VIC AB 5-0 P-3 18XBRD (SUTURE) IMPLANT
SUT VIC AB 5-0 P3 18 (SUTURE)
SYR BULB IRRIGATION 50ML (SYRINGE) IMPLANT
SYR CONTROL 10ML LL (SYRINGE) ×3 IMPLANT
TOWEL OR 17X26 10 PK STRL BLUE (TOWEL DISPOSABLE) ×3 IMPLANT
TOWEL OR NON WOVEN STRL DISP B (DISPOSABLE) ×3 IMPLANT
WATER STERILE IRR 1000ML POUR (IV SOLUTION) ×3 IMPLANT
YANKAUER SUCT BULB TIP 10FT TU (MISCELLANEOUS) ×3 IMPLANT

## 2018-04-27 NOTE — Op Note (Signed)
OPERATIVE NOTE  04/27/2018  11:04 AM  PATIENT:  Shane Diaz, 72 y.o., male, MRN: [id number]  PREOP DIAGNOSIS:  umbilical hernia  POSTOP DIAGNOSIS:   Umbilical hernia (3.5 cm defect)  PROCEDURE:   Procedure(s):  Open REPAIR UMBILICAL HERNIA  SURGEON:   Alphonsa Overall, M.D.  ASSISTANT:   None  ANESTHESIA:   general  Anesthesiologist: Myrtie Soman, MD CRNA: Maxwell Caul, CRNA  General  EBL:  minimale  ml  BLOOD ADMINISTERED: none  DRAINS: none   LOCAL MEDICATIONS USED:   30 cc of 1/4% marcaine  SPECIMEN:   None  COUNTS CORRECT:  YES  INDICATIONS FOR PROCEDURE:  [name] is a 72 y.o. (DOB: Nov 05, 1945) white male whose primary care physician is [name], MD and comes for an umbilical hernia.   He had a 1.0 cm ulcer over his umbilicus with surrounding cellulitis when I saw him in the office.  The umbilical wound looked better today, but with the ulcer and potential infection, I was hesitant to place permanent mesh.  The indications and risks of the surgery were explained to the patient.  The risks include, but are not limited to, infection, bleeding, and nerve injury.  OPERATIVE NOTE:  The patient was taken to room 1 at [facility].  He underwent a general anesthesia.  He was given 3 grams of Ancef at the beginning of the operation.   A time out was held and the surgical checklist run.   The abdomen was prepped with Cholroprep and sterilely draped.    He had a 3 cm protruding mass at the umbilicus.   He has a 1.0 cm ulcer healing over the umbilical hernia. Once asleep, I was able to reduce this mass.  I made a infraumbilical "smiling" incision and cut down to the rectus fascia.  He had a 3.5 cm defect at the umbilicus.  I reduced the preperitoneal fat and did not get into the abdominal cavity.   I closed the defect with six interrupted 0 Novafil.   I then tacked the umbilical skin down to the fascia with 3-0 vicryl.  I used 3-0 vicryl for the subcutaneous layer.  The  incision was closed with 4-0 Monocryl and painted with tincture of Benzoin and closed with Steristrips.  An abdominal binder was placed.   The sponge and needle count were correct.  The patient was transferred to the recovery room in good condition.   The plan is to discharge the patient today.  Type of repair - Primary suture  (choices - primary suture, mesh, or component)  Name of mesh - None   Alphonsa Overall, MD, [facility] Surgery Pager: [phone] Office phone:  [phone]

## 2018-04-27 NOTE — Anesthesia Procedure Notes (Signed)
Procedure Name: Intubation Date/Time: 04/27/2018 10:02 AM Performed by: Maxwell Caul, CRNA Pre-anesthesia Checklist: Patient identified, Emergency Drugs available, Suction available and Patient being monitored Patient Re-evaluated:Patient Re-evaluated prior to induction Oxygen Delivery Method: Circle system utilized Preoxygenation: Pre-oxygenation with 100% oxygen Induction Type: IV induction Ventilation: Mask ventilation without difficulty Laryngoscope Size: Mac and 4 Grade View: Grade II Tube type: Oral Tube size: 7.5 mm Number of attempts: 1 Airway Equipment and Method: Stylet Placement Confirmation: ETT inserted through vocal cords under direct vision,  positive ETCO2 and breath sounds checked- equal and bilateral Secured at: 21 cm Tube secured with: Tape Dental Injury: Teeth and Oropharynx as per pre-operative assessment

## 2018-04-27 NOTE — H&P (Signed)
Shane Diaz  Location: [facility] Patient #: [id number] DOB: September 22, 1945 Married / Language: Undefined / Race: Undefined Male  History of Present Illness   The patient is a 72 year old male who presents with a complaint of umbilical hernia.  His first name is Shane Diaz  The PCP is Dr. Murvin Diaz  He comes with his wife. I see her for right breast cancer.  He has noticed an umbilical hernia for several months if not longer. However recently enlarged some. He developed some redness and tenderness around the umbilical hernia. He saw Shane Diaz, who tried to put a needle in this. I do not have the notes from Dr. Adela Diaz office. The tenderness and redness is a little better. But he has a little bit of a breakdown of the skin over the hernia. He had an open appendectomy as a child. He had a colonoscopy in October 2015 in which Dr. Ardis Diaz remove some adenomatous polyps. He knows he is up for a follow-up colonoscopy in 3-5 years from that colonoscopy. He has no liver, stomach, pancreas, or colon disease.  I discussed the indications and complications of hernia surgery with the patient. I discussed both the laparoscopic and open approach to hernia repair.. The potential risks of hernia surgery include, but are not limited to, bleeding, infection, open surgery, nerve injury, and recurrence of the hernia.  I provided the patient literature about hernia surgery. I talked to him about his weight has been a condition which increases the risk of a hernia recurring. Because he has skin breakdown over the hernia, very hesitant to place mesh in the hernia. I talked about doing this as an open umbilical hernia with sutures, accepting a higher recurrence rate because of the skin breakdown.  Review of Systems as stated in this history (HPI) or in the review of systems. Otherwise all other 12 point ROS are negative  Past Medical  History: 1. History of phlebotomies - Shane Diaz 2. colonoscopy 2015 Shane Diaz 3. Morbid obesity We talked about loosing weight to prevent a recurrence from the hernia  Social History: Married His wife, Shane Diaz (DOB 12/02/1950), is a breast cancer patient of mine.   Past Surgical History Shane Diaz, Shane Diaz; 04/13/2018 2:42 PM) Appendectomy  Colon Polyp Removal - Colonoscopy   Allergies Shane Diaz; 04/13/2018 2:02 PM) No Known Drug Allergies [04/13/2018]: Allergies Reconciled   Medication History Shane Diaz; 04/13/2018 2:02 PM) No Current Medications Medications Reconciled  Other Problems Shane Diaz, RMA; 04/13/2018 2:42 PM) Back Pain  Umbilical Hernia Repair     Review of Systems Shane Diaz RMA; 04/13/2018 2:42 PM) General Not Present- Appetite Loss, Chills, Fatigue, Fever, Night Sweats, Weight Gain and Weight Loss. Skin Not Present- Change in Wart/Mole, Dryness, Hives, Jaundice, New Lesions, Non-Healing Wounds, Rash and Ulcer. HEENT Not Present- Earache, Hearing Loss, Hoarseness, Nose Bleed, Oral Ulcers, Ringing in the Ears, Seasonal Allergies, Sinus Pain, Sore Throat, Visual Disturbances, Wears glasses/contact lenses and Yellow Eyes. Respiratory Not Present- Bloody sputum, Chronic Cough, Difficulty Breathing, Snoring and Wheezing. Breast Not Present- Breast Mass, Breast Pain, Nipple Discharge and Skin Changes. Cardiovascular Not Present- Chest Pain, Difficulty Breathing Lying Down, Leg Cramps, Palpitations, Rapid Heart Rate, Shortness of Breath and Swelling of Extremities. Gastrointestinal Present- Abdominal Pain. Not Present- Bloating, Bloody Stool, Change in Bowel Habits, Chronic diarrhea, Constipation, Difficulty Swallowing, Excessive gas, Gets full quickly at meals, Hemorrhoids, Indigestion, Nausea, Rectal Pain and Vomiting. Male Genitourinary Not Present- Blood in Urine, Change in Urinary Stream, Frequency,  Impotence, Nocturia,  Painful Urination, Urgency and Urine Leakage. Musculoskeletal Not Present- Back Pain, Joint Pain, Joint Stiffness, Muscle Pain, Muscle Weakness and Swelling of Extremities. Neurological Not Present- Decreased Memory, Fainting, Headaches, Numbness, Seizures, Tingling, Tremor, Trouble walking and Weakness. Psychiatric Not Present- Anxiety, Bipolar, Change in Sleep Pattern, Depression, Fearful and Frequent crying. Endocrine Not Present- Cold Intolerance, Excessive Hunger, Hair Changes, Heat Intolerance, Hot flashes and New Diabetes. Hematology Not Present- Blood Thinners, Easy Bruising, Excessive bleeding, Gland problems, HIV and Persistent Infections.  Vitals Shane Diaz; 04/13/2018 2:03 PM) 04/13/2018 2:02 PM Weight: 283.6 lb Height: 66in Body Surface Area: 2.32 m Body Mass Index: 45.77 kg/m  Pain Level: 5/10 Temp.: 97.76F(Temporal)  Pulse: 88 (Regular)  P.OX: 95% (Room air) BP: 150/88 (Sitting, Left Arm, Standard)   Physical Exam  General: Obese WM with thick accent alert and generally healthy appearing. HEENT: Normal. Pupils equal.  Neck: Supple. No mass. No thyroid mass. Lymph Nodes: No supraclavicular or cervical nodes.  Lungs: Clear to auscultation and symmetric breath sounds. Heart: RRR. No murmur or rub.  Abdomen: Soft. No mass. No tenderness. Normal bowel sounds. Obese. He has a diastasis recti.  Righr lower quadrant scar.  Umbilical hernia with an approximately 2.0 cm fascia defect.  He has some mild redness to the skin and a central area of skin break down about 1.0 cm in diameter. Rectal: Not done.  Extremities: Good strength and ROM in upper and lower extremities.  Neurologic: Grossly intact to motor and sensory function. Psychiatric: Has normal mood and affect. Behavior is normal.  Assessment & Plan  1.  UMBILICAL HERNIA WITHOUT OBSTRUCTION OR GANGRENE (K42.9)  Plan:  1) Open repair of umbilical hernia. Because of skin breakdown, will not use  mesh.  2.  MORBID OBESITY (E66.01)  BMI - 45.7  Shane Overall, MD, [facility] Pager: [phone] Office phone:  [phone]

## 2018-04-27 NOTE — Interval H&P Note (Signed)
Wife at bedside. Ready for surgery. 

## 2018-04-27 NOTE — Anesthesia Postprocedure Evaluation (Signed)
Anesthesia Post Note  Patient: Shane Diaz  Procedure(s) Performed: REPAIR UMBILICAL HERNIA ERAS PATHWAY (N/A )     Patient location during evaluation: PACU Anesthesia Type: General Level of consciousness: awake and alert Pain management: pain level controlled Vital Signs Assessment: post-procedure vital signs reviewed and stable Respiratory status: spontaneous breathing, nonlabored ventilation, respiratory function stable and patient connected to nasal cannula oxygen Cardiovascular status: blood pressure returned to baseline and stable Postop Assessment: no apparent nausea or vomiting Anesthetic complications: no    Last Vitals:  Vitals:   04/27/18 1240 04/27/18 1300  BP: (!) 155/85 (!) 162/93  Pulse: 96 86  Resp: 17 18  Temp: 36.9 C 36.7 C  SpO2: 95% 93%    Last Pain:  Vitals:   04/27/18 1240  TempSrc:   PainSc: 0-No pain                 Matsue Strom S

## 2018-04-27 NOTE — Transfer of Care (Signed)
Immediate Anesthesia Transfer of Care Note  Patient: Shane Diaz  Procedure(s) Performed: REPAIR UMBILICAL HERNIA ERAS PATHWAY (N/A )  Patient Location: PACU  Anesthesia Type:General  Level of Consciousness: awake, alert  and oriented  Airway & Oxygen Therapy: Patient Spontanous Breathing and Patient connected to face mask oxygen  Post-op Assessment: Report given to RN and Post -op Vital signs reviewed and stable  Post vital signs: Reviewed and stable  Last Vitals:  Vitals Value Taken Time  BP 141/81 04/27/2018 11:19 AM  Temp 37.2 C 04/27/2018 11:19 AM  Pulse 104 04/27/2018 11:24 AM  Resp 25 04/27/2018 11:24 AM  SpO2 97 % 04/27/2018 11:24 AM  Vitals shown include unvalidated device data.  Last Pain:  Vitals:   04/27/18 0821  TempSrc:   PainSc: 0-No pain      Patients Stated Pain Goal: 4 (17/51/02 5852)  Complications: No apparent anesthesia complications

## 2018-04-27 NOTE — Discharge Instructions (Signed)
[facility] - DISCHARGE INSTRUCTIONS TO PATIENT  Activity:  Driving - may drive in 3 or 4 days, if doing well and not taking pain meds   Lifting - No lifting more than 15 pounds for 4 weeks.  Wound Care:   Leave binder on for 2 days.  Then you may remove the bandage and shower.          Wear the abdominal bind while awake for one month.  Diet:  As tolerated.  Drink plenty of fluid.  Follow up appointment:  Call Dr. Pollie Friar office [facility]) at [phone] for an appointment in 2 to 3 weeks  Medications and dosages:  Resume your home medications.  You have a prescription for:  Vicodin.  Call Dr. Lucia Gaskins or his office  [phone]) if you have:  Temperature greater than 100.4,  Persistent nausea and vomiting,  Severe uncontrolled pain,  Redness, tenderness, or signs of infection (pain, swelling, redness, odor or green/yellow discharge around the site),  Difficulty breathing, headache or visual disturbances,  Any other questions or concerns you may have after discharge.  In an emergency, call 911 or go to an Emergency Department at a nearby hospital.

## 2018-04-27 NOTE — Anesthesia Preprocedure Evaluation (Signed)
Anesthesia Evaluation  Patient identified by MRN, date of birth, ID band Patient awake    Reviewed: Allergy & Precautions, NPO status , Patient's Chart, lab work & pertinent test results  Airway Mallampati: II  TM Distance: >3 FB Neck ROM: Full    Dental no notable dental hx.    Pulmonary neg pulmonary ROS,    Pulmonary exam normal breath sounds clear to auscultation       Cardiovascular hypertension, Normal cardiovascular exam Rhythm:Regular Rate:Normal     Neuro/Psych negative neurological ROS  negative psych ROS   GI/Hepatic negative GI ROS, Neg liver ROS,   Endo/Other  negative endocrine ROSMorbid obesity  Renal/GU negative Renal ROS  negative genitourinary   Musculoskeletal negative musculoskeletal ROS (+)   Abdominal   Peds negative pediatric ROS (+)  Hematology negative hematology ROS (+)   Anesthesia Other Findings   Reproductive/Obstetrics negative OB ROS                             Anesthesia Physical Anesthesia Plan  ASA: II  Anesthesia Plan: General   Post-op Pain Management:    Induction: Intravenous  PONV Risk Score and Plan: 2 and Ondansetron, Dexamethasone and Treatment may vary due to age or medical condition  Airway Management Planned: Oral ETT  Additional Equipment:   Intra-op Plan:   Post-operative Plan: Extubation in OR  Informed Consent: I have reviewed the patients History and Physical, chart, labs and discussed the procedure including the risks, benefits and alternatives for the proposed anesthesia with the patient or authorized representative who has indicated his/her understanding and acceptance.   Dental advisory given  Plan Discussed with: CRNA and Surgeon  Anesthesia Plan Comments:         Anesthesia Quick Evaluation

## 2018-04-28 ENCOUNTER — Encounter (HOSPITAL_COMMUNITY): Payer: Self-pay | Admitting: Surgery

## 2019-03-09 ENCOUNTER — Other Ambulatory Visit: Payer: Self-pay

## 2019-03-09 DIAGNOSIS — I83893 Varicose veins of bilateral lower extremities with other complications: Secondary | ICD-10-CM

## 2019-03-10 ENCOUNTER — Ambulatory Visit (HOSPITAL_COMMUNITY)
Admission: RE | Admit: 2019-03-10 | Discharge: 2019-03-10 | Disposition: A | Discharge status: Home / Self Care | Payer: Medicare Other | Source: Ambulatory Visit | Attending: Vascular Surgery | Admitting: Vascular Surgery

## 2019-03-10 ENCOUNTER — Encounter (HOSPITAL_COMMUNITY): Payer: Medicare Other

## 2019-03-10 ENCOUNTER — Encounter: Payer: Medicare Other | Admitting: Vascular Surgery

## 2019-03-10 ENCOUNTER — Other Ambulatory Visit: Payer: Self-pay

## 2019-03-10 ENCOUNTER — Ambulatory Visit (INDEPENDENT_AMBULATORY_CARE_PROVIDER_SITE_OTHER): Payer: Medicare Other | Admitting: Vascular Surgery

## 2019-03-10 ENCOUNTER — Encounter: Payer: Self-pay | Admitting: Vascular Surgery

## 2019-03-10 VITALS — BP 149/90 | HR 91 | Temp 97.3°F | Resp 18 | Ht 68.4 in | Wt 290.9 lb

## 2019-03-10 DIAGNOSIS — I824Z2 Acute embolism and thrombosis of unspecified deep veins of left distal lower extremity: Secondary | ICD-10-CM

## 2019-03-10 DIAGNOSIS — I83893 Varicose veins of bilateral lower extremities with other complications: Secondary | ICD-10-CM | POA: Diagnosis present

## 2019-03-10 MED ORDER — RIVAROXABAN (XARELTO) VTE STARTER PACK (15 & 20 MG): ORAL_TABLET | ORAL | 0 refills | Status: DC

## 2019-03-10 NOTE — Progress Notes (Addendum)
REASON FOR CONSULT:    Varicose veins left leg with pain and swelling.  The patient is self-referred.  ASSESSMENT & PLAN:   DEEP VENOUS THROMBOSIS LEFT LEG: The patient's venous duplex scan today shows evidence of acute deep venous thrombosis of the left leg involving the femoral vein and popliteal vein.  I have started the patient on Xarelto which she will start today.  We have discussed the importance of leg elevation and the specific physician that is most affected.  I have encouraged him to elevate his leg for at least a couple of weeks to get the swelling down before being fitted for compression stockings.  I have given him a prescription for knee-high stockings with a gradient of 15 to 20 mmHg.  I encouraged him to gradually resume his activity as tolerated.  I have ordered a follow-up duplex scan in 3 months and I will see him back at that time.  At that point we will make a decision about how long to continue the Xarelto.  Deitra Mayo, MD, FACS Beeper [phone] Office: [phone]   HPI:   Shane Diaz is a pleasant 73 y.o. male, who presents with pain and swelling of the left leg.  The patient states that this began towards the end of March and has gradually progressed.  He describes significant pain in the left leg and gradually increasing swelling in the left leg.  He is had swelling in both legs but clearly the swelling has been more significant on the left.  On 07/02/2015 he had stab phlebectomies of the left leg.  Prior to that he had laser ablation of the left great saphenous vein.  The patient denies any chest pain, shortness of breath, or pleuritic chest pain.  Past Medical History:  Diagnosis Date  . Arthritis   . Diverticulosis 05/2015   Mild, noted on colonosocpy  . Fatty liver 05/14/2015   noted on [place] ABD  . History of colon polyps    sessile in ascending colon, pedunculated in sigmoid colon  . Hyperlipidemia    pt denies  . Hypertension    not currently  taking medications per MD  . 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 Umbilical hernia   . Varicose veins     Family History  Problem Relation Age of Onset  . Colon cancer Neg Hx   . Esophageal cancer Neg Hx   . Pancreatic cancer Neg Hx   . Prostate cancer Neg Hx   . Rectal cancer Neg Hx   . Stomach cancer Neg Hx     SOCIAL HISTORY: Social History   Socioeconomic History  . Marital status: Married    Spouse name: Not on file  . Number of children: Not on file  . Years of education: Not on file  . Highest education level: Not on file  Occupational History  . Not on file  Social Needs  . Financial resource strain: Not on file  . Food insecurity    Worry: Not on file    Inability: Not on file  . Transportation needs    Medical: Not on file    Non-medical: Not on file  Tobacco Use  . Smoking status: Never Smoker  . Smokeless tobacco: Never Used  Substance and Sexual Activity  . Alcohol use: Yes    Alcohol/week: 0.0 standard drinks    Comment: occassional wine, martini  . Drug use: No  . Sexual activity: Not on file  Lifestyle  . Physical activity    Days per week: Not on file    Minutes per session: Not on file  . Stress: Not on file  Relationships  . Social Herbalist on phone: Not on file    Gets together: Not on file    Attends religious service: Not on file    Active member of club or organization: Not on file    Attends meetings of clubs or organizations: Not on file    Relationship status: Not on file  . Intimate partner violence    Fear of current or ex partner: Not on file    Emotionally abused: Not on file    Physically abused: Not on file    Forced sexual activity: Not on file  Other Topics Concern  . Not on file  Social History Narrative  . Not on file    No Known Allergies  Current Outpatient Medications  Medication Sig Dispense Refill  . HYDROcodone-acetaminophen (NORCO/VICODIN) 5-325 MG tablet Take 1 tablet by  mouth every 6 (six) hours as needed for moderate pain. (Patient not taking: Reported on 03/10/2019) 20 tablet 0  . Rivaroxaban 15 & 20 MG TBPK Take as directed on package: Start with one 15mg  tablet by mouth twice a day with food. On Day 22, switch to one 20mg  tablet once a day with food. 51 each 0   No current facility-administered medications for this visit.     REVIEW OF SYSTEMS:  [X]  denotes positive finding, [ ]  denotes negative finding Cardiac  Comments:  Chest pain or chest pressure:    Shortness of breath upon exertion:    Short of breath when lying flat:    Irregular heart rhythm:        Vascular    Pain in calf, thigh, or hip brought on by ambulation:    Pain in feet at night that wakes you up from your sleep:     Blood clot in your veins:    Leg swelling:  x       Pulmonary    Oxygen at home:    Productive cough:     Wheezing:         Neurologic    Sudden weakness in arms or legs:     Sudden numbness in arms or legs:     Sudden onset of difficulty speaking or slurred speech:    Temporary loss of vision in one eye:     Problems with dizziness:         Gastrointestinal    Blood in stool:     Vomited blood:         Genitourinary    Burning when urinating:     Blood in urine:        Psychiatric    Major depression:         Hematologic    Bleeding problems:    Problems with blood clotting too easily:        Skin    Rashes or ulcers:        Constitutional    Fever or chills:     PHYSICAL EXAM:   Vitals:   03/10/19 1053  BP: (!) 149/90  Pulse: 91  Resp: 18  Temp: (!) 97.3 F (36.3 C)  TempSrc: Temporal  SpO2: 94%  Weight: 290 lb 14.4 oz (132 kg)  Height: 5' 8.4" (1.737 m)    GENERAL: The patient is a well-nourished male, in no acute  distress. The vital signs are documented above. CARDIAC: There is a regular rate and rhythm.  VASCULAR: I do not detect carotid bruits. He has palpable pedal pulses in the right foot. Because of the leg swelling on  the left I could not palpate pulses however he had biphasic signals in the dorsalis pedis and posterior tibial positions. He has significant bilateral lower extremity swelling which is more significant on the left side as documented below.  He has hyperpigmentation bilaterally. The right calf measures 17 inches in diameter. The left calf measures 20 inches in diameter. PULMONARY: There is good air exchange bilaterally without wheezing or rales. ABDOMEN: Soft and non-tender with normal pitched bowel sounds.  MUSCULOSKELETAL: There are no major deformities or cyanosis. NEUROLOGIC: No focal weakness or paresthesias are detected. SKIN: There are no ulcers or rashes noted. PSYCHIATRIC: The patient has a normal affect.  DATA:    VENOUS DUPLEX: I have independently interpreted his venous duplex scan today.  He has evidence of acute deep venous thrombosis involving the left femoral vein and popliteal vein.  This does not extend into the common femoral vein.  There was no evidence of DVT on the right.  On the right side, he did have deep venous reflux involving the popliteal vein and some superficial venous reflux improved following the proximal short saphenous vein.  The vein was not especially dilated however.

## 2019-03-11 ENCOUNTER — Other Ambulatory Visit: Payer: Self-pay | Admitting: *Deleted

## 2019-03-11 DIAGNOSIS — I824Z2 Acute embolism and thrombosis of unspecified deep veins of left distal lower extremity: Secondary | ICD-10-CM

## 2019-03-11 MED ORDER — RIVAROXABAN (XARELTO) VTE STARTER PACK (15 & 20 MG): ORAL_TABLET | ORAL | 0 refills | Status: DC

## 2019-03-24 ENCOUNTER — Encounter (HOSPITAL_COMMUNITY): Payer: Medicare Other

## 2019-03-24 ENCOUNTER — Encounter: Payer: Medicare Other | Admitting: Vascular Surgery

## 2019-05-31 ENCOUNTER — Telehealth: Payer: Self-pay | Admitting: *Deleted

## 2019-05-31 NOTE — Telephone Encounter (Signed)
Left a message for the patient to call back. He is scheduled for a virtual appointment on 10/27 with Dr. Sallyanne Kuster but will need to reschedule for a new patient office visit.

## 2019-05-31 NOTE — Telephone Encounter (Signed)
Attempted to reach an interpretor but one was not avaiable at that time. Will try back again.

## 2019-06-01 ENCOUNTER — Ambulatory Visit (INDEPENDENT_AMBULATORY_CARE_PROVIDER_SITE_OTHER): Payer: Medicare Other | Admitting: Cardiovascular Disease

## 2019-06-01 ENCOUNTER — Encounter: Payer: Self-pay | Admitting: Cardiovascular Disease

## 2019-06-01 ENCOUNTER — Other Ambulatory Visit (INDEPENDENT_AMBULATORY_CARE_PROVIDER_SITE_OTHER): Payer: Medicare Other

## 2019-06-01 ENCOUNTER — Other Ambulatory Visit: Payer: Self-pay

## 2019-06-01 VITALS — BP 143/85 | HR 96 | Ht 66.0 in | Wt 298.2 lb

## 2019-06-01 DIAGNOSIS — I83892 Varicose veins of left lower extremities with other complications: Secondary | ICD-10-CM

## 2019-06-01 DIAGNOSIS — R0681 Apnea, not elsewhere classified: Secondary | ICD-10-CM

## 2019-06-01 DIAGNOSIS — I82412 Acute embolism and thrombosis of left femoral vein: Secondary | ICD-10-CM

## 2019-06-01 DIAGNOSIS — I872 Venous insufficiency (chronic) (peripheral): Secondary | ICD-10-CM

## 2019-06-01 DIAGNOSIS — I83893 Varicose veins of bilateral lower extremities with other complications: Secondary | ICD-10-CM

## 2019-06-01 DIAGNOSIS — I83899 Varicose veins of unspecified lower extremities with other complications: Secondary | ICD-10-CM

## 2019-06-01 DIAGNOSIS — G4733 Obstructive sleep apnea (adult) (pediatric): Secondary | ICD-10-CM

## 2019-06-01 DIAGNOSIS — I82541 Chronic embolism and thrombosis of right tibial vein: Secondary | ICD-10-CM

## 2019-06-01 DIAGNOSIS — I519 Heart disease, unspecified: Secondary | ICD-10-CM

## 2019-06-01 MED ORDER — APIXABAN 5 MG PO TABS: 5.0000 mg | ORAL_TABLET | Freq: Two times a day (BID) | ORAL | 11 refills | Status: DC

## 2019-06-01 NOTE — Patient Instructions (Addendum)
Medication Instructions:  STOP the Xarelto START Eliquis 5 mg twice daily  *If you need a refill on your cardiac medications before your next appointment, please call your pharmacy*  Lab Work: No ordered If you have labs (blood work) drawn today and your tests are completely normal, you will receive your results only by: [place] MyChart Message (if you have MyChart) OR . A paper copy in the mail If you have any lab test that is abnormal or we need to change your treatment, we will call you to review the results.  Testing/Procedures: Your physician has recommended that you have a sleep study. This test records several body functions during sleep, including: brain activity, eye movement, oxygen and carbon dioxide blood levels, heart rate and rhythm, breathing rate and rhythm, the flow of air through your mouth and nose, snoring, body muscle movements, and chest and belly movement.  Follow-Up: At [facility], you and your health needs are our priority.  As part of our continuing mission to provide you with exceptional heart care, we have created designated Provider Care Teams.  These Care Teams include your primary Cardiologist (physician) and Advanced Practice Providers (APPs -  Physician Assistants and Nurse Practitioners) who all work together to provide you with the care you need, when you need it.  Your next appointment:   3 months  The format for your next appointment:   In Person  Provider:   Sanda Klein, MD  Other Instructions Keep your legs elevated as much as possible. Wear compression stockings during the day when you are not able to elevate your legs

## 2019-06-01 NOTE — Progress Notes (Signed)
Cardiology new patient note:    Date:  06/01/2019   ID:  Shane Diaz, DOB 01-29-1946, MRN [id number]  PCP:  [name], MD  Cardiologist:  No primary care provider on file. New Electrophysiologist:  None   Referring MD: [name], MD   No chief complaint on file.   History of Present Illness:    Shane Diaz is a 73 y.o. 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  He recovered from this fully.    4 years ago he had a right distal femoral and popliteal DVT, partially resolved by follow-up ultrasound in 2016 but with chronic occlusion of the right tibioperoneal venous trunk.  About 3 years ago he was having a lot of lower extremity pain related to large varicose veins and underwent venous ablation in his right lower extremity.  He had laser ablation of the left greater saphenous vein and stab phlebectomies of the left leg in November 2016  Starting in March 2020 he had gradually worsening pain and swelling in the left calf, more so than on the right.  In August of this year he developed severe swelling and discomfort in his left calf and was diagnosed with a DVT and started on Xarelto.  He believed that he developed reaction to the Xarelto since he had a lot of vesicular bumps on the swollen calf and was switched to Eliquis.  There was no evidence of cutaneous changes in any other territory.  He has not had cough, hemoptysis or dyspnea.  It seems to me he has been taking  Eliquis only 5 mg once daily.  He continues to have severe swelling in the left lower extremity.  He tried using compression stockings for several days but they cause a lot of itching and he stopped using them.  He saw Dr. Scot Dock who advised leg elevation and compression stockings.  During the day he usually sits at his computer with the legs hanging down.  He had a couple of small lesions on the anterior and medial surface of his ankle which sound like venous stasis ulcers.  His wife treated him with hydrogen peroxide and they have healed.  It does not sound like he had fever or cellulitis.  He denies angina or dyspnea at rest or with usual activity and does not feel limited with by shortness of breath except with more intense activity.  He does not have palpitations or syncope.  He has severe bilateral lower extremity edema that is substantially worse on the left side.  To a large degree this is chronic, but the left leg swelling has been very troublesome.  He is a heavy snorer and it sounds like he has episodes of nocturnal apnea (his wife describes it as suddenly "coughing" in the middle of his sleep and she will pat him on the back to make him stop).  He has significant daytime hypersomnolence.  He has to take a nap every afternoon.  He does not wake up feeling refreshed in the morning.  It looks like  in 2014 Dr. Mellody Drown recommended a sleep study but the patient refused.  His blood pressure is mildly elevated and was also mildly elevated when he saw Dr. Mellody Drown. (On 1 occasion 136/77, at another visit with Dr. Scot Dock 149/90).  There is a mention of a 2016 echo that showed moderate left ventricular hypertrophy, grade 1 diastolic dysfunction and normal left ventricular systolic function and his primary care provider's notes, but I do not find a copy of that report in the [facility] system.  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  He has been living in [place] for the last 10 years or so.  Past Medical History:  Diagnosis Date   Arthritis    Diverticulosis 05/2015   Mild, noted on colonosocpy   Fatty liver 05/14/2015   noted on [place] ABD   History of colon polyps    sessile in ascending colon, pedunculated in sigmoid colon   Hyperlipidemia    pt denies   Hypertension    not currently taking medications per MD   Renal cyst 05/2014   Small, Left, noted on [place] ABD   Umbilical hernia    Varicose veins     Past Surgical History:  Procedure Laterality Date   APPENDECTOMY     childhood   COLONOSCOPY W/ POLYPECTOMY  [id number]   UMBILICAL HERNIA REPAIR N/A 04/27/2018   Procedure: REPAIR UMBILICAL HERNIA ERAS PATHWAY;  Surgeon: Alphonsa Overall, MD;  Location: [facility];  Service: General;  Laterality: N/A;   varicose veins     WRIST SURGERY      Current Medications: No outpatient medications have been marked as taking for the 06/01/19 encounter (Office Visit) with Sanda Klein, MD.     Allergies:   Patient has no known allergies.   Social History   Socioeconomic History   Marital status: Married    Spouse name: Not on file   Number of children: Not on file   Years of education: Not on file   Highest education level: Not on file  Occupational History   Not on file  Social Needs   Financial resource strain: Not on file   Food insecurity    Worry: Not on file    Inability: Not on file   Transportation needs    Medical: Not on file    Non-medical: Not on file  Tobacco Use   Smoking status: Never Smoker   Smokeless tobacco: Never Used  Substance and Sexual Activity   Alcohol use: Yes    Alcohol/week: 0.0 standard drinks    Comment: occassional wine, martini   Drug use: No   Sexual activity: Not on file  Lifestyle   Physical activity    Days per week: Not on file    Minutes per session: Not on file     Stress: Not on file  Relationships   Social connections    Talks on phone: Not on file    Gets together: Not on file    Attends religious service: Not on file    Active member of club or organization: Not on file    Attends meetings of clubs or organizations: Not on file    Relationship status: Not on file  Other Topics Concern   Not on file  Social History Narrative   Not on file     Family History: The patient's  family history is negative for Colon cancer, Esophageal cancer, Pancreatic cancer, Prostate cancer, Rectal cancer, and Stomach cancer.  ROS:   Please see the history of present illness.     All other systems reviewed and are negative.  EKGs/Labs/Other Studies Reviewed:    The following studies were reviewed today: Multiple venous duplex ultrasound studies, most recently August 2020 Notes from Dr. Scot Dock and Dr. Mellody Drown EKG:  EKG is  ordered today.  The ekg ordered today demonstrates normal sinus rhythm, normal tracing  Recent Labs: No results found for requested labs within last 8760 hours.   04/20/2018 hemoglobin 17.9, creatinine 1.26, potassium 4.3 05/09/2019 hemoglobin 15.8, otherwise normal CBC, potassium 4.8, creatinine 1.25, normal liver function tests, hemoglobin A1c 6.3% 05/23/2019 glucose 135, other labs similar. Recent Lipid Panel No results found for: CHOL, TRIG, HDL, CHOLHDL, VLDL, LDLCALC, LDLDIRECT  Physical Exam:    VS:  BP (!) 143/85    Pulse 96    Ht 5\' 6"  (1.676 m)    Wt 298 lb 3.2 oz (135.3 kg)    BMI 48.13 kg/m     Wt Readings from Last 3 Encounters:  06/01/19 298 lb 3.2 oz (135.3 kg)  03/10/19 290 lb 14.4 oz (132 kg)  04/27/18 265 lb (120.2 kg)     GEN: Morbidly obese, well nourished, well developed in no acute distress HEENT: Normal NECK: No JVD; No carotid bruits LYMPHATICS: No lymphadenopathy CARDIAC: RRR, no murmurs, rubs, gallops RESPIRATORY:  Clear to auscultation without rales, wheezing or rhonchi  ABDOMEN: Soft,  non-tender, non-distended MUSCULOSKELETAL:  There is 2+ pitting edema almost to the knee on the right side with some cyanosis and hyperpigmentation.   There is 3-4+ hard pitting edema with brawny changes to the knee, cyanosis, healed scars of ulcerations just above the medial malleolus on the left side; No limb deformity  SKIN: Warm and dry NEUROLOGIC:  Alert and oriented x 3 PSYCHIATRIC:  Normal affect   ASSESSMENT:    1. Acute deep vein thrombosis (DVT) of femoral vein of left lower extremity (HCC)   2. 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 Venous insufficiency of both lower extremities   4. Morbid obesity ([place])   5. OSA (obstructive sleep apnea)   6. Left ventricular diastolic dysfunction    PLAN:    In order of problems listed above:  1. DVT: I think it is critical that he stay on long-term anticoagulation and make sure that he is taking an effective dose.  Represcribed Eliquis 5 mg twice daily.  I do not think that he ever had an allergic reaction to Xarelto, but misinterpreted to the brawny edema as a sign of a drug reaction.  I think at least anecdotally Eliquis is a safer agent.  He has clear evidence of bilateral venous insufficiency and reflux and obstruction documented by ultrasound.  I believe that the situation is much more serious on the left side because he has had previous ablation of the superficial venous system and he now has severe venous insufficiency.  I fully agree with the device he was given by our vascular surgeon, Dr. Scot Dock.  He needs to keep his legs elevated as much as possible, preferably on a special contoured pillow.  When his legs are not elevated he should wear compression stockings, including when he is sitting in a chair at the computer.  He should keep a very close eye for any skin injuries or ulcerations that  need to be treated very promptly. 2. Obesity: Obviously this has a lot to do with the original cause  of his venous insufficiency.  Weight loss would still be highly beneficial. 3. Pre-DM: I am sure that with some weight loss he could markedly improve his metabolic parameters.  He does not require pharmacological therapy at this point, although metformin could be considered to assist with weight loss. 4. OSA: I think he has multiple signs and symptoms of this disorder.  He clearly has a very crowded oropharynx and a rather nasal voice.  He has a very wide neck circumference.  He has daytime hypersomnolence and poor sleep.  Although she interprets as a cough, I believe his wife is describing periods of resolving apnea during his sleep.  I strongly recommended a sleep study, preferably an ambulatory split-night study since I think the diagnosis is so certain. 5. Diast dysf: He does not have overt evidence of heart failure clinically.  I would like to review his echocardiogram.  He has relatively mild hypertension.  Obesity may be contributing to diastolic heart failure.  We will wait for the results of his sleep study before we recommend additional investigations.   Medication Adjustments/Labs and Tests Ordered: Current medicines are reviewed at length with the patient today.  Concerns regarding medicines are outlined above.  Orders Placed This Encounter  Procedures   EKG 12-Lead   Split night study   Meds ordered this encounter  Medications   apixaban (ELIQUIS) 5 MG TABS tablet    Sig: Take 1 tablet (5 mg total) by mouth 2 (two) times daily.    Dispense:  60 tablet    Refill:  11    Patient Instructions  Medication Instructions:  STOP the Xarelto START Eliquis 5 mg twice daily  *If you need a refill on your cardiac medications before your next appointment, please call your pharmacy*  Lab Work: No ordered If you have labs (blood work) drawn today and your tests are completely normal, you will receive your results only by:  [place] (if you have MyChart) OR  A paper copy in  the mail If you have any lab test that is abnormal or we need to change your treatment, we will call you to review the results.  Testing/Procedures: Your physician has recommended that you have a sleep study. This test records several body functions during sleep, including: brain activity, eye movement, oxygen and carbon dioxide blood levels, heart rate and rhythm, breathing rate and rhythm, the flow of air through your mouth and nose, snoring, body muscle movements, and chest and belly movement.  Follow-Up: At [facility], you and your health needs are our priority.  As part of our continuing mission to provide you with exceptional heart care, we have created designated Provider Care Teams.  These Care Teams include your primary Cardiologist (physician) and Advanced Practice Providers (APPs -  Physician Assistants and Nurse Practitioners) who all work together to provide you with the care you need, when you need it.  Your next appointment:   3 months  The format for your next appointment:   In Person  Provider:   Sanda Klein, MD  Other Instructions Keep your legs elevated as much as possible. Wear compression stockings during the day when you are not able to elevate your legs      Signed, Sanda Klein, MD  06/01/2019 3:38 PM    [place]

## 2019-06-09 ENCOUNTER — Other Ambulatory Visit: Payer: Self-pay

## 2019-06-09 DIAGNOSIS — I83893 Varicose veins of bilateral lower extremities with other complications: Secondary | ICD-10-CM

## 2019-06-09 DIAGNOSIS — I824Z2 Acute embolism and thrombosis of unspecified deep veins of left distal lower extremity: Secondary | ICD-10-CM

## 2019-06-09 NOTE — Addendum Note (Signed)
Addended by: Karn Cassis on: 06/09/2019 09:36 AM   Modules accepted: Orders

## 2019-06-16 ENCOUNTER — Encounter (HOSPITAL_COMMUNITY): Payer: Medicare Other

## 2019-06-16 ENCOUNTER — Ambulatory Visit: Payer: Medicare Other | Admitting: Vascular Surgery

## 2019-06-17 ENCOUNTER — Other Ambulatory Visit (HOSPITAL_COMMUNITY): Payer: Medicare Other

## 2019-06-19 ENCOUNTER — Encounter (HOSPITAL_BASED_OUTPATIENT_CLINIC_OR_DEPARTMENT_OTHER): Payer: Medicare Other | Admitting: Cardiovascular Disease

## 2019-07-06 ENCOUNTER — Ambulatory Visit: Payer: Medicare Other | Admitting: Vascular Surgery

## 2019-07-06 ENCOUNTER — Encounter (HOSPITAL_COMMUNITY): Payer: Medicare Other

## 2019-07-21 ENCOUNTER — Encounter (HOSPITAL_COMMUNITY): Payer: Medicare Other

## 2019-07-21 ENCOUNTER — Ambulatory Visit: Payer: Medicare Other | Admitting: Vascular Surgery

## 2019-08-04 ENCOUNTER — Ambulatory Visit (HOSPITAL_COMMUNITY): Payer: Medicare Other

## 2019-08-04 ENCOUNTER — Ambulatory Visit: Payer: Medicare Other | Admitting: Vascular Surgery

## 2019-08-31 ENCOUNTER — Encounter: Payer: Self-pay | Admitting: Cardiovascular Disease

## 2019-08-31 ENCOUNTER — Ambulatory Visit (INDEPENDENT_AMBULATORY_CARE_PROVIDER_SITE_OTHER): Payer: Medicare Other | Admitting: Cardiovascular Disease

## 2019-08-31 ENCOUNTER — Other Ambulatory Visit: Payer: Self-pay

## 2019-08-31 VITALS — BP 124/72 | HR 65 | Ht 66.0 in | Wt 289.0 lb

## 2019-08-31 DIAGNOSIS — I872 Venous insufficiency (chronic) (peripheral): Secondary | ICD-10-CM | POA: Diagnosis not present

## 2019-08-31 DIAGNOSIS — I519 Heart disease, unspecified: Secondary | ICD-10-CM

## 2019-08-31 DIAGNOSIS — I87009 Postthrombotic syndrome without complications of unspecified extremity: Secondary | ICD-10-CM

## 2019-08-31 DIAGNOSIS — R7303 Prediabetes: Secondary | ICD-10-CM

## 2019-08-31 DIAGNOSIS — R29818 Other symptoms and signs involving the nervous system: Secondary | ICD-10-CM

## 2019-08-31 DIAGNOSIS — I5189 Other ill-defined heart diseases: Secondary | ICD-10-CM

## 2019-08-31 NOTE — Progress Notes (Signed)
Cardiology new patient note:    Date:  09/02/2019   ID:  Shane Diaz, DOB Sep 22, 1945, MRN [id number]  PCP:  Shane Panda, MD  Cardiologist:  No primary care provider on file. New Electrophysiologist:  None   Referring MD: Shane Panda, MD   Chief Complaint  Patient presents with  . Edema    History of Present Illness:    Shane Diaz is a 74 y.o. 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  He continues to be troubled mostly by lower extremity edema, especially prominent in the left lower extremity.  If he keeps his leg elevated for long periods of time the edema does improve substantially as does the discoloration.  Otherwise his leg is cyanotic and tight.  His wife has been helping him put on the compression stockings before he gets out of bed and this does help alleviate the edema.  He has not had any open sores.  4 years ago he had a right distal femoral and popliteal DVT, partially resolved by follow-up ultrasound in 2016 but with chronic occlusion of the right tibioperoneal venous trunk.  About 3 years ago he was having a lot of lower extremity pain related to large varicose veins and underwent venous ablation in his right lower extremity.  He had laser ablation of the left greater saphenous vein and stab phlebectomies of the left leg in November 2016  Starting in March 2020 he had gradually worsening pain and swelling in the left calf, more so than on the right.  In August of this year he developed severe swelling and discomfort in his left calf and was diagnosed with a DVT and started on  Xarelto.  Switched to Eliquis later because of suspected allergy (I suspect this was actually only normal skin changes related to brawny edema).  The cost of the medication is a problem and today we will apply for patient assistance from the pharmaceutical company.  He has a follow-up visit and ultrasound scheduled with Dr. Scot Diaz on March 11.  He denies angina or dyspnea at rest or with his usual activity (he is quite sedentary).  He has not had focal neurological events, syncope, palpitations or serious bleeding problems.  He canceled his sleep study.  It sounds like he would not use CPAP even if we did confirm the diagnosis.  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  He has been living in [place] for the last 10 years or so.  Past Medical History:  Diagnosis Date  . Arthritis   . Diverticulosis 05/2015   Mild, noted on colonosocpy  . Fatty liver 05/14/2015   noted on [place] ABD  . History of colon polyps    sessile in ascending colon, pedunculated in sigmoid colon  . Hyperlipidemia    pt denies  . Hypertension    not currently taking medications per MD  . 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 Umbilical hernia   . Varicose veins     Past  Surgical History:  Procedure Laterality Date  . APPENDECTOMY     childhood  . COLONOSCOPY W/ POLYPECTOMY  05/23/2014  . UMBILICAL HERNIA REPAIR N/A 04/27/2018   Procedure: REPAIR UMBILICAL HERNIA ERAS PATHWAY;  Surgeon: Alphonsa Overall, MD;  Location: [facility];  Service: General;  Laterality: N/A;  . varicose veins    . WRIST SURGERY      Current Medications: Current Meds  Medication Sig  . apixaban (ELIQUIS) 5 MG TABS tablet Take 1 tablet (5 mg total) by mouth 2 (two) times daily.  Marland Kitchen HYDROcodone-acetaminophen (NORCO/VICODIN) 5-325 MG tablet Take 1 tablet by mouth every 6 (six) hours as  needed for moderate pain.     Allergies:   Patient has no known allergies.   Social History   Socioeconomic History  . Marital status: Married    Spouse name: Not on file  . Number of children: Not on file  . Years of education: Not on file  . Highest education level: Not on file  Occupational History  . Not on file  Tobacco Use  . Smoking status: Never Smoker  . Smokeless tobacco: Never Used  Substance and Sexual Activity  . Alcohol use: Yes    Alcohol/week: 0.0 standard drinks    Comment: occassional wine, martini  . Drug use: No  . Sexual activity: Not on file  Other Topics Concern  . Not on file  Social History Narrative  . Not on file   Social Determinants of Health   Financial Resource Strain:   . Difficulty of Paying Living Expenses: Not on file  Food Insecurity:   . Worried About Charity fundraiser in the Last Year: Not on file  . Ran Out of Food in the Last Year: Not on file  Transportation Needs:   . Lack of Transportation (Medical): Not on file  . Lack of Transportation (Non-Medical): Not on file  Physical Activity:   . Days of Exercise per Week: Not on file  . Minutes of Exercise per Session: Not on file  Stress:   . Feeling of Stress : Not on file  Social Connections:   . Frequency of Communication with Friends and Family: Not on file  . Frequency of Social Gatherings with Friends and Family: Not on file  . Attends Religious Services: Not on file  . Active Member of Clubs or Organizations: Not on file  . Attends Archivist Meetings: Not on file  . Marital Status: Not on file     Family History: The patient's family history is negative for Colon cancer, Esophageal cancer, Pancreatic cancer, Prostate cancer, Rectal cancer, and Stomach cancer.  ROS:   Please see the history of present illness.    All other systems are reviewed and are negative.   EKGs/Labs/Other Studies Reviewed:    The following studies were reviewed  today: Multiple venous duplex ultrasound studies, most recently August 2020 Notes from Dr. Scot Diaz and Shane Diaz EKG:  EKG is  ordered today.  The ekg ordered today demonstrates normal sinus rhythm, normal tracing  Recent Labs: No results found for requested labs within last 8760 hours.   04/20/2018 hemoglobin 17.9, creatinine 1.26, potassium 4.3 05/09/2019 hemoglobin 15.8, otherwise normal CBC, potassium 4.8, creatinine 1.25, normal liver function tests, hemoglobin A1c 6.3% 05/23/2019 glucose 135, other labs similar. Recent Lipid Panel No results found for: CHOL, TRIG, HDL, CHOLHDL, VLDL, LDLCALC, LDLDIRECT  Physical Exam:    VS:  BP 124/72   Pulse 65   Ht 5'  6" (1.676 m)   Wt 289 lb (131.1 kg)   BMI 46.65 kg/m     Wt Readings from Last 3 Encounters:  08/31/19 289 lb (131.1 kg)  06/01/19 298 lb 3.2 oz (135.3 kg)  03/10/19 290 lb 14.4 oz (132 kg)      General: Alert, oriented x3, no distress, morbidly obese Head: no evidence of trauma, PERRL, EOMI, no exophtalmos or lid lag, no myxedema, no xanthelasma; normal ears, nose and oropharynx Neck: normal jugular venous pulsations and no hepatojugular reflux; brisk carotid pulses without delay and no carotid bruits Chest: clear to auscultation, no signs of consolidation by percussion or palpation, normal fremitus, symmetrical and full respiratory excursions Cardiovascular: normal position and quality of the apical impulse, regular rhythm, normal first and second heart sounds, no murmurs, rubs or gallops Abdomen: no tenderness or distention, no masses by palpation, no abnormal pulsatility or arterial bruits, normal bowel sounds, no hepatosplenomegaly Extremities: no clubbing, cyanosis; has bilateral lower extremity edema, hard pitting, 2+ on the right, 3-4+ on the left, dependent cyanosis, no ulcerations are seen.; 2+ radial, ulnar and brachial pulses bilaterally; unable to palpate pulses in the feet due to severe hard  edema. Neurological: grossly nonfocal Psych: Normal mood and affect   ASSESSMENT:    1. Postphlebitic syndrome   2. Peripheral venous insufficiency   3. Morbid obesity ([place])   4. Prediabetes   5. Suspected sleep apnea   6. Echocardiogram shows left ventricular diastolic dysfunction    PLAN:    In order of problems listed above:  1. DVT: I think conservative management is necessary for his postphlebitic syndrome, as well as lifelong anticoagulation.  We will apply for assistance from the pharmaceutical manufacturer for his oral anticoagulant.  Discussed the need for special contoured pillow to help elevate his limb with maximal efficiency and venous drainage.  Encouraged him to keep his follow-up appoint with Dr. Scot Diaz, but I do not think there will be any radical solution to his problem.  He likely has organized thrombus in the deep veins as well as compromised superficial circulation following superficial venous ablation. 2. Obesity: Strongly encouraged sustained efforts at weight loss. 3. Pre-DM: I am sure that with some weight loss he could markedly improve his metabolic parameters.  He does not require pharmacological therapy at this point, although metformin could be considered to assist with weight loss. 4. OSA: Went over the adverse consequences of untreated sleep apnea on his cardiac and pulmonary system in detail again.  He clearly does not want to discuss his sleep study anymore. 5. Diast dysf: I have not been able to review the images of his echo performed in 2016.  Most of his physical findings can be readily explained by venous insufficiency and right heart disease related to sleep apnea.  I do not think we need to invoke left heart disease, but would still like to review his echocardiogram when possible.   Medication Adjustments/Labs and Tests Ordered: Current medicines are reviewed at length with the patient today.  Concerns regarding medicines are outlined above.  No  orders of the defined types were placed in this encounter.  No orders of the defined types were placed in this encounter.   Patient Instructions  Medication Instructions:  3 boxes of Eliquis 5 mg samples given.  *If you need a refill on your cardiac medications before your next appointment, please call your pharmacy*  Lab Work: None ordered If you have labs (blood work) drawn today and your tests  are completely normal, you will receive your results only by: Marland Kitchen MyChart Message (if you have MyChart) OR . A paper copy in the mail If you have any lab test that is abnormal or we need to change your treatment, we will call you to review the results.  Testing/Procedures: None ordered  Follow-Up: At [facility], you and your health needs are our priority.  As part of our continuing mission to provide you with exceptional heart care, we have created designated Provider Care Teams.  These Care Teams include your primary Cardiologist (physician) and Advanced Practice Providers (APPs -  Physician Assistants and Nurse Practitioners) who all work together to provide you with the care you need, when you need it.  Your next appointment:   6 month(s)  The format for your next appointment:   In Person  Provider:   Sanda Klein, MD      Signed, Sanda Klein, MD  09/02/2019 6:11 PM    [place]

## 2019-08-31 NOTE — Patient Instructions (Signed)
Medication Instructions:  3 boxes of Eliquis 5 mg samples given.  *If you need a refill on your cardiac medications before your next appointment, please call your pharmacy*  Lab Work: None ordered If you have labs (blood work) drawn today and your tests are completely normal, you will receive your results only by: Marland Kitchen MyChart Message (if you have MyChart) OR . A paper copy in the mail If you have any lab test that is abnormal or we need to change your treatment, we will call you to review the results.  Testing/Procedures: None ordered  Follow-Up: At [facility], you and your health needs are our priority.  As part of our continuing mission to provide you with exceptional heart care, we have created designated Provider Care Teams.  These Care Teams include your primary Cardiologist (physician) and Advanced Practice Providers (APPs -  Physician Assistants and Nurse Practitioners) who all work together to provide you with the care you need, when you need it.  Your next appointment:   6 month(s)  The format for your next appointment:   In Person  Provider:   Sanda Klein, MD

## 2019-09-02 ENCOUNTER — Encounter: Payer: Self-pay | Admitting: Cardiovascular Disease

## 2019-09-12 ENCOUNTER — Ambulatory Visit: Payer: Medicare Other | Attending: Internal Medicine

## 2019-09-12 DIAGNOSIS — Z23 Encounter for immunization: Secondary | ICD-10-CM | POA: Insufficient documentation

## 2019-09-12 NOTE — Progress Notes (Signed)
   Covid-19 Vaccination Clinic  Name:  Shane Diaz    MRN: [id number] DOB: 10/22/1945  09/12/2019  Mr. [name] was observed post Covid-19 immunization for 15 minutes without incidence. He was provided with Vaccine Information Sheet and instruction to access the V-Safe system.   Mr. [name] was instructed to call 911 with any severe reactions post vaccine: Marland Kitchen Difficulty breathing  . Swelling of your face and throat  . A fast heartbeat  . A bad rash all over your body  . Dizziness and weakness    Immunizations Administered    Name Date Dose VIS Date Route   Pfizer COVID-19 Vaccine 09/12/2019  4:45 PM 0.3 mL 07/15/2019 Intramuscular   Manufacturer: [place]   Lot: [id number]   [place]: [id number]

## 2019-09-29 ENCOUNTER — Other Ambulatory Visit: Payer: Self-pay | Admitting: Internal Medicine

## 2019-09-29 DIAGNOSIS — N6310 Unspecified lump in the right breast, unspecified quadrant: Secondary | ICD-10-CM

## 2019-10-07 ENCOUNTER — Ambulatory Visit: Payer: Medicare Other | Attending: Internal Medicine

## 2019-10-07 DIAGNOSIS — Z23 Encounter for immunization: Secondary | ICD-10-CM | POA: Insufficient documentation

## 2019-10-07 NOTE — Progress Notes (Signed)
   Covid-19 Vaccination Clinic  Name:  Shane Diaz    MRN: [id number] DOB: [birth year]  10/07/2019  Mr. [name] was observed post Covid-19 immunization for 15 minutes without incident. He was provided with Vaccine Information Sheet and instruction to access the V-Safe system.   Mr. [name] was instructed to call 911 with any severe reactions post vaccine: Marland Kitchen Difficulty breathing  . Swelling of face and throat  . A fast heartbeat  . A bad rash all over body  . Dizziness and weakness   Immunizations Administered    Name Date Dose VIS Date Route   Pfizer COVID-19 Vaccine 10/07/2019 12:04 PM 0.3 mL 07/15/2019 Intramuscular   Manufacturer: [place]   Lot: [id number]   [place]: [id number]

## 2019-10-12 ENCOUNTER — Telehealth (HOSPITAL_COMMUNITY): Payer: Self-pay

## 2019-10-12 NOTE — Telephone Encounter (Signed)
The above patient or their representative was contacted and gave the following answers to these questions:         Do you have any of the following symptoms?    NO  Fever                    Cough                   Shortness of breath  Do  you have any of the following other symptoms?    muscle pain         vomiting,        diarrhea        rash         weakness        red eye        abdominal pain         bruising          bruising or bleeding              joint pain           severe headache    Have you been in contact with someone who was or has been sick in the past 2 weeks?  NO  Yes                 Unsure                         Unable to assess   Does the person that you were in contact with have any of the following symptoms?   Cough         shortness of breath           muscle pain         vomiting,            diarrhea            rash            weakness           fever            red eye           abdominal pain           bruising  or  bleeding                joint pain                severe headache                 COMMENTS OR ACTION PLAN FOR THIS PATIENT:        ALL QUESTIONS WERE ANSWERED/[facility] PT HAS GOTTEN 2 COVID SHOTS

## 2019-10-13 ENCOUNTER — Ambulatory Visit (INDEPENDENT_AMBULATORY_CARE_PROVIDER_SITE_OTHER): Payer: Medicare Other | Admitting: Vascular Surgery

## 2019-10-13 ENCOUNTER — Ambulatory Visit (HOSPITAL_COMMUNITY)
Admission: RE | Admit: 2019-10-13 | Discharge: 2019-10-13 | Disposition: A | Discharge status: Home / Self Care | Payer: Medicare Other | Source: Ambulatory Visit | Attending: Vascular Surgery | Admitting: Vascular Surgery

## 2019-10-13 ENCOUNTER — Encounter: Payer: Self-pay | Admitting: Vascular Surgery

## 2019-10-13 ENCOUNTER — Other Ambulatory Visit: Payer: Self-pay

## 2019-10-13 VITALS — BP 122/76 | HR 94 | Temp 97.2°F | Resp 18 | Ht 69.5 in | Wt 281.6 lb

## 2019-10-13 DIAGNOSIS — I824Z2 Acute embolism and thrombosis of unspecified deep veins of left distal lower extremity: Secondary | ICD-10-CM

## 2019-10-13 DIAGNOSIS — I83893 Varicose veins of bilateral lower extremities with other complications: Secondary | ICD-10-CM

## 2019-10-13 NOTE — Progress Notes (Signed)
Patient name: Shane Diaz MRN: [id number] DOB: 1945/09/17 Sex: male  REASON FOR VISIT:   Follow-up of left lower extremity DVT.  HPI:   Shane Diaz is a pleasant 74 y.o. male who I had seen on 03/10/2019 with pain and swelling of the left leg.  This had begun at the end of March and was gradually progressing.  He had swelling in both legs but the swelling was clearly worse on the left.  He had a previous stab phlebectomies in the left leg in November 2016.  Prior to that he had laser ablation of the left great saphenous vein.  At the time of that visit he was found to have an acute DVT of the left lower extremity involving the femoral vein and popliteal vein.  I started him on Xarelto.  We discussed the importance of leg elevation.  I ordered a follow-up study in 3 months.   Since I saw him last he continues to have some leg swelling bilaterally.  He has been wearing his knee-high compression stockings which help with the swelling.  He also has been elevating his legs which help.  He was initially on Xarelto but states that at the end of October he was switched to Eliquis by his primary care physician.  His main complaint has been some purplish discoloration of both legs related to his venous hypertension.  He denies any chest pain or shortness of breath.  Past Medical History:  Diagnosis Date  . Arthritis   . Diverticulosis 05/2015   Mild, noted on colonosocpy  . Fatty liver 05/14/2015   noted on [place] ABD  . History of colon polyps    sessile in ascending colon, pedunculated in sigmoid colon  . Hyperlipidemia    pt denies  . Hypertension    not currently taking medications per MD  . 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 Umbilical hernia   . Varicose veins     Family History  Problem Relation Age of Onset  . Colon cancer Neg Hx   . Esophageal cancer Neg Hx   . Pancreatic cancer Neg Hx   . Prostate cancer Neg Hx   . Rectal cancer Neg Hx   . Stomach cancer Neg  Hx     SOCIAL HISTORY: Social History   Tobacco Use  . Smoking status: Never Smoker  . Smokeless tobacco: Never Used  Substance Use Topics  . Alcohol use: Yes    Alcohol/week: 0.0 standard drinks    Comment: occassional wine, martini    No Known Allergies  Current Outpatient Medications  Medication Sig Dispense Refill  . apixaban (ELIQUIS) 5 MG TABS tablet Take 1 tablet (5 mg total) by mouth 2 (two) times daily. 60 tablet 11  . HYDROcodone-acetaminophen (NORCO/VICODIN) 5-325 MG tablet Take 1 tablet by mouth every 6 (six) hours as needed for moderate pain. 20 tablet 0   No current facility-administered medications for this visit.    REVIEW OF SYSTEMS:  [X]  denotes positive finding, [ ]  denotes negative finding Cardiac  Comments:  Chest pain or chest pressure:    Shortness of breath upon exertion:    Short of breath when lying flat:    Irregular heart rhythm:        Vascular    Pain in calf, thigh, or hip brought on by ambulation:    Pain in feet at night that wakes you up from your sleep:     Blood clot in  your veins:    Leg swelling:  x       Pulmonary    Oxygen at home:    Productive cough:     Wheezing:         Neurologic    Sudden weakness in arms or legs:     Sudden numbness in arms or legs:     Sudden onset of difficulty speaking or slurred speech:    Temporary loss of vision in one eye:     Problems with dizziness:         Gastrointestinal    Blood in stool:     Vomited blood:         Genitourinary    Burning when urinating:     Blood in urine:        Psychiatric    Major depression:         Hematologic    Bleeding problems:    Problems with blood clotting too easily:        Skin    Rashes or ulcers:        Constitutional    Fever or chills:     PHYSICAL EXAM:   Vitals:   10/13/19 1503  BP: 122/76  Pulse: 94  Resp: 18  Temp: (!) 97.2 F (36.2 C)  TempSrc: Temporal  SpO2: 97%  Weight: 281 lb 9.6 oz (127.7 kg)  Height: 5' 9.5"  (1.765 m)    GENERAL: The patient is a well-nourished male, in no acute distress. The vital signs are documented above. CARDIAC: There is a regular rate and rhythm.  VASCULAR: I do not detect carotid bruits. He has palpable posterior tibial pulses bilaterally. He has venous hypertension with purplish discoloration of both legs. PULMONARY: There is good air exchange bilaterally without wheezing or rales. ABDOMEN: Soft and non-tender with normal pitched bowel sounds.  MUSCULOSKELETAL: There are no major deformities or cyanosis. NEUROLOGIC: No focal weakness or paresthesias are detected. SKIN: There are no ulcers or rashes noted. PSYCHIATRIC: The patient has a normal affect.  DATA:    VENOUS DUPLEX: I have independently interpreted his venous duplex scan of the left lower extremity today.  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   MEDICAL ISSUES:   HISTORY OF LEFT LOWER EXTREMITY DVT: He has now been on anticoagulation for 6 months with his left lower extremity DVT.  He has chronic clot in the left femoral vein and popliteal vein.  At this point I think it would be safe to discontinue his anticoagulation.  I have explained that there is some risk of bleeding with long-term anticoagulation and I am simply trying to weigh the advantages and disadvantages.  He is at high risk for developing DVT in the future and certainly if he develops recurrent leg swelling he would need a follow-up duplex.  In addition and if he develops recurrent DVTs he may need to be on lifelong anticoagulation.  We have again discussed the importance of intermittent leg elevation and the proper positioning for this.  I encouraged him to continue to wear his knee-high compression stockings with a gradient of 15 to 20 mmHg.  He would like [place] to continue to follow this closely and I have ordered follow-up venous duplex scan in 6 months of both lower extremities.  I will see him back at that  time.  He knows to call sooner if he has problems.  Deitra Mayo [facility]  Beeper [phone]

## 2019-10-18 ENCOUNTER — Other Ambulatory Visit: Payer: Self-pay | Admitting: *Deleted

## 2019-10-18 DIAGNOSIS — I83893 Varicose veins of bilateral lower extremities with other complications: Secondary | ICD-10-CM

## 2020-05-07 ENCOUNTER — Emergency Department (HOSPITAL_COMMUNITY): Payer: Medicare Other

## 2020-05-07 ENCOUNTER — Inpatient Hospital Stay (HOSPITAL_COMMUNITY)
Admission: EM | Admit: 2020-05-07 | Discharge: 2020-05-10 | DRG: 177 | Disposition: A | Discharge status: Home / Self Care | Payer: Medicare Other | Attending: Internal Medicine | Admitting: Internal Medicine

## 2020-05-07 DIAGNOSIS — R778 Other specified abnormalities of plasma proteins: Secondary | ICD-10-CM

## 2020-05-07 DIAGNOSIS — Z7901 Long term (current) use of anticoagulants: Secondary | ICD-10-CM

## 2020-05-07 DIAGNOSIS — E785 Hyperlipidemia, unspecified: Secondary | ICD-10-CM | POA: Diagnosis present

## 2020-05-07 DIAGNOSIS — I480 Paroxysmal atrial fibrillation: Secondary | ICD-10-CM | POA: Diagnosis not present

## 2020-05-07 DIAGNOSIS — M1990 Unspecified osteoarthritis, unspecified site: Secondary | ICD-10-CM | POA: Diagnosis present

## 2020-05-07 DIAGNOSIS — G4733 Obstructive sleep apnea (adult) (pediatric): Secondary | ICD-10-CM | POA: Diagnosis present

## 2020-05-07 DIAGNOSIS — Z86718 Personal history of other venous thrombosis and embolism: Secondary | ICD-10-CM

## 2020-05-07 DIAGNOSIS — R7303 Prediabetes: Secondary | ICD-10-CM | POA: Diagnosis present

## 2020-05-07 DIAGNOSIS — I4891 Unspecified atrial fibrillation: Secondary | ICD-10-CM

## 2020-05-07 DIAGNOSIS — U071 COVID-19: Principal | ICD-10-CM | POA: Diagnosis present

## 2020-05-07 DIAGNOSIS — K760 Fatty (change of) liver, not elsewhere classified: Secondary | ICD-10-CM | POA: Diagnosis present

## 2020-05-07 DIAGNOSIS — A419 Sepsis, unspecified organism: Secondary | ICD-10-CM

## 2020-05-07 DIAGNOSIS — I10 Essential (primary) hypertension: Secondary | ICD-10-CM | POA: Diagnosis present

## 2020-05-07 DIAGNOSIS — I472 Ventricular tachycardia: Secondary | ICD-10-CM | POA: Diagnosis not present

## 2020-05-07 DIAGNOSIS — J9601 Acute respiratory failure with hypoxia: Secondary | ICD-10-CM | POA: Diagnosis present

## 2020-05-07 DIAGNOSIS — Z6841 Body Mass Index (BMI) 40.0 and over, adult: Secondary | ICD-10-CM

## 2020-05-07 DIAGNOSIS — J1282 Pneumonia due to coronavirus disease 2019: Secondary | ICD-10-CM | POA: Diagnosis present

## 2020-05-07 DIAGNOSIS — E7800 Pure hypercholesterolemia, unspecified: Secondary | ICD-10-CM | POA: Diagnosis present

## 2020-05-07 DIAGNOSIS — D6959 Other secondary thrombocytopenia: Secondary | ICD-10-CM | POA: Diagnosis present

## 2020-05-07 LAB — TROPONIN I (HIGH SENSITIVITY): Troponin I (High Sensitivity): 116 ng/L (ref <18)

## 2020-05-07 LAB — COMPREHENSIVE METABOLIC PANEL
ALT: 60 U/L — ABNORMAL HIGH (ref 0–44)
AST: 139 U/L — ABNORMAL HIGH (ref 15–41)
Albumin: 3 g/dL — ABNORMAL LOW (ref 3.5–5.0)
Alkaline Phosphatase: 60 U/L (ref 38–126)
Anion gap: 12 (ref 5–15)
BUN: 17 mg/dL (ref 8–23)
CO2: 23 mmol/L (ref 22–32)
Calcium: 8.5 mg/dL — ABNORMAL LOW (ref 8.9–10.3)
Chloride: 97 mmol/L — ABNORMAL LOW (ref 98–111)
Creatinine, Ser: 1.06 mg/dL (ref 0.61–1.24)
GFR calc Af Amer: >60 mL/min (ref >60)
GFR calc non Af Amer: >60 mL/min (ref >60)
Glucose, Bld: 119 mg/dL — ABNORMAL HIGH (ref 70–99)
Potassium: 4.3 mmol/L (ref 3.5–5.1)
Sodium: 132 mmol/L — ABNORMAL LOW (ref 135–145)
Total Bilirubin: 1.1 mg/dL (ref 0.3–1.2)
Total Protein: 6.1 g/dL — ABNORMAL LOW (ref 6.5–8.1)

## 2020-05-07 LAB — CBC WITH DIFFERENTIAL/PLATELET
Abs Immature Granulocytes: 0.06 10*3/uL (ref 0.00–0.07)
Basophils Absolute: 0 10*3/uL (ref 0.0–0.1)
Basophils Relative: 1 %
Eosinophils Absolute: 0 10*3/uL (ref 0.0–0.5)
Eosinophils Relative: 0 %
HCT: 53.4 % — ABNORMAL HIGH (ref 39.0–52.0)
Hemoglobin: 17.8 g/dL — ABNORMAL HIGH (ref 13.0–17.0)
Immature Granulocytes: 1 %
Lymphocytes Relative: 21 %
Lymphs Abs: 1.2 10*3/uL (ref 0.7–4.0)
MCH: 33.3 pg (ref 26.0–34.0)
MCHC: 33.3 g/dL (ref 30.0–36.0)
MCV: 99.8 fL (ref 80.0–100.0)
Monocytes Absolute: 0.4 10*3/uL (ref 0.1–1.0)
Monocytes Relative: 7 %
Neutro Abs: 4.1 10*3/uL (ref 1.7–7.7)
Neutrophils Relative %: 70 %
Platelets: 122 10*3/uL — ABNORMAL LOW (ref 150–400)
RBC: 5.35 MIL/uL (ref 4.22–5.81)
RDW: 12.8 % (ref 11.5–15.5)
WBC: 5.8 10*3/uL (ref 4.0–10.5)
nRBC: 0 % (ref 0.0–0.2)

## 2020-05-07 LAB — LACTIC ACID, PLASMA: Lactic Acid, Venous: 1.4 mmol/L (ref 0.5–1.9)

## 2020-05-07 MED ORDER — ACETAMINOPHEN 500 MG PO TABS
1000.0000 mg | ORAL_TABLET | Freq: Once | ORAL | Status: AC
  Administered 2020-05-07: 1000 mg via ORAL
  Filled 2020-05-07: qty 2

## 2020-05-07 NOTE — ED Notes (Signed)
One set of cultures collected and sent

## 2020-05-07 NOTE — ED Notes (Signed)
Urinal provided to pt and made aware of need for urine specimen, verbalized understanding of same. Friend at bedside.

## 2020-05-07 NOTE — ED Triage Notes (Addendum)
Patient presents from home via EMS with CC of fever x 1 day and generalized weakness that started today. Patient denies pain. Denies shortness of breath. Hx of AFIB, on Eliquis. AAOx4. Answers questions appropriately,  Denies need for translator. Denies cough. Has been vaccinated for COVID. Denies known exposure.

## 2020-05-07 NOTE — ED Provider Notes (Signed)
Medical screening examination/treatment/procedure(s) were conducted as a shared visit with non-physician practitioner(s) and myself.  I personally evaluated the patient during the encounter.  Clinical Impression:   Final diagnoses:  COVID-19  Paroxysmal atrial fibrillation (HCC)  Pneumonia due to COVID-19 virus   Patient is a 74 year old male presenting with weakness, called out for the ambulance, was found to be tachycardic with atrial fibrillation and a rapid ventricular rate, febrile, mildly tachypneic.  He is morbidly obese, slight symmetrical edema of his lower extremities below the knees, abdomen is soft nontender, lungs are clear though he is mildly tachypneic.  The patient is tachycardic with an irregular rate with a rate between 90 and 120 variable.  Oropharynx is clear mucous membranes are moist.  Source of the fever will be sought, x-ray, labs, continue Cardizem drip which was started prehospital.  Shane Diaz was evaluated in Emergency Department on 05/08/2020 for the symptoms described in the history of present illness. He was evaluated in the context of the global COVID-19 pandemic, which necessitated consideration that the patient might be at risk for infection with the SARS-CoV-2 virus that causes COVID-19. Institutional protocols and algorithms that pertain to the evaluation of patients at risk for COVID-19 are in a state of rapid change based on information released by regulatory bodies including the CDC and federal and state organizations. These policies and algorithms were followed during the patient's care in the ED.  .Critical Care Performed by: Noemi Chapel, MD Authorized by: Noemi Chapel, MD   Critical care provider statement:    Critical care time (minutes):  35   Critical care time was exclusive of:  Separately billable procedures and treating other patients and teaching time   Critical care was necessary to treat or prevent imminent or life-threatening  deterioration of the following conditions:  Respiratory failure and cardiac failure   Critical care was time spent personally by me on the following activities:  Blood draw for specimens, development of treatment plan with patient or surrogate, discussions with consultants, evaluation of patient's response to treatment, examination of patient, obtaining history from patient or surrogate, ordering and performing treatments and interventions, ordering and review of laboratory studies, ordering and review of radiographic studies, pulse oximetry, re-evaluation of patient's condition and review of old charts      Noemi Chapel, MD 05/08/20 1454

## 2020-05-07 NOTE — ED Provider Notes (Addendum)
[facility] EMERGENCY DEPARTMENT Provider Note   CSN: [id number] Arrival date & time: 05/07/20  2220     History Chief Complaint  Patient presents with  . Weakness  . Fever    Shane Diaz is a 74 y.o. male.  The history is provided by the patient and medical records.    74 year old male with history of arthritis, fatty liver, hyperlipidemia, hypertension, A. fib on Eliquis, varicose veins, presenting to the ED with 1 day of generalized weakness and fever.  States he called the ambulance because he was too weak to even walk.  Upon EMS arrival he was found to be in A. fib with rate into 180s.  He was given Cardizem bolus followed by drip and 600cc IVF, he is now rate controlled on arrival.  He denies any chest pain or shortness of breath.  He is not had any sick contacts or known Covid exposures.  He denies any abdominal pain, nausea, vomiting, or diarrhea.  He has been vaccinated for COVID-19.  Past Medical History:  Diagnosis Date  . Arthritis   . Diverticulosis 05/2015   Mild, noted on colonosocpy  . Fatty liver 05/14/2015   noted on [place] ABD  . History of colon polyps    sessile in ascending colon, pedunculated in sigmoid colon  . Hyperlipidemia    pt denies  . Hypertension    not currently taking medications per MD  . 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 Umbilical hernia   . Varicose veins     Patient Active Problem List   Diagnosis Date Noted  . Varicose veins of leg with complications 37/90/2409  . Varicose veins of left lower extremity with complications 73/53/2992  . Varicose veins of bilateral lower extremities with other complications 42/68/3419  . Special screening for malignant neoplasms, colon 04/11/2014    Past Surgical History:  Procedure Laterality Date  . APPENDECTOMY     childhood  . COLONOSCOPY W/ POLYPECTOMY  05/23/2014  . UMBILICAL HERNIA REPAIR N/A 04/27/2018   Procedure: REPAIR UMBILICAL HERNIA ERAS PATHWAY;   Surgeon: Alphonsa Overall, MD;  Location: [facility];  Service: General;  Laterality: N/A;  . varicose veins    . WRIST SURGERY         Family History  Problem Relation Age of Onset  . Colon cancer Neg Hx   . Esophageal cancer Neg Hx   . Pancreatic cancer Neg Hx   . Prostate cancer Neg Hx   . Rectal cancer Neg Hx   . Stomach cancer Neg Hx     Social History   Tobacco Use  . Smoking status: Never Smoker  . Smokeless tobacco: Never Used  Vaping Use  . Vaping Use: Never used  Substance Use Topics  . Alcohol use: Yes    Alcohol/week: 0.0 standard drinks    Comment: occassional wine, martini  . Drug use: No    Home Medications Prior to Admission medications   Medication Sig Start Date End Date Taking? Authorizing Provider  apixaban (ELIQUIS) 5 MG TABS tablet Take 1 tablet (5 mg total) by mouth 2 (two) times daily. 06/01/19   [name], [name], MD  HYDROcodone-acetaminophen (NORCO/VICODIN) 5-325 MG tablet Take 1 tablet by mouth every 6 (six) hours as needed for moderate pain. 04/27/18   Alphonsa Overall, MD    Allergies    Patient has no known allergies.  Review of Systems   Review of Systems  Constitutional: Positive for fever.  Neurological: Positive for weakness (generalized).  All other systems reviewed and are negative.   Physical Exam Updated Vital Signs BP 133/79 (BP Location: Right Arm)   Pulse 98   Temp 99.9 F (37.7 C) (Oral)   Resp (!) 22   Ht 5\' 7"  (1.702 m)   Wt 117.9 kg   SpO2 95%   BMI 40.72 kg/m   Physical Exam Vitals and nursing note reviewed.  Constitutional:      Appearance: He is well-developed. He is obese.     Comments: Elderly, warm to the touch  HENT:     Head: Normocephalic and atraumatic.  Eyes:     Conjunctiva/sclera: Conjunctivae normal.     Pupils: Pupils are equal, round, and reactive to light.  Cardiovascular:     Rate and Rhythm: Tachycardia present. Rhythm irregularly irregular.     Heart sounds: Normal heart sounds.      Comments: AFIB, rate 100-120 on arrival Pulmonary:     Effort: Pulmonary effort is normal. Tachypnea present.     Breath sounds: Normal breath sounds. No stridor. No wheezing or rhonchi.     Comments: Tachypneic, sounds congested, O2 sats low 90's on RA Abdominal:     General: Bowel sounds are normal.     Palpations: Abdomen is soft.  Musculoskeletal:        General: Normal range of motion.     Cervical back: Normal range of motion.  Skin:    General: Skin is warm and dry.  Neurological:     Mental Status: He is alert and oriented to person, place, and time.     ED Results / Procedures / Treatments   Labs (all labs ordered are listed, but only abnormal results are displayed) Labs Reviewed  RESPIRATORY PANEL BY RT PCR (FLU A&B, COVID) - Abnormal; Notable for the following components:      Result Value   SARS Coronavirus 2 by RT PCR POSITIVE (*)    All other components within normal limits  CBC WITH DIFFERENTIAL/PLATELET - Abnormal; Notable for the following components:   Hemoglobin 17.8 (*)    HCT 53.4 (*)    Platelets 122 (*)    All other components within normal limits  COMPREHENSIVE METABOLIC PANEL - Abnormal; Notable for the following components:   Sodium 132 (*)    Chloride 97 (*)    Glucose, Bld 119 (*)    Calcium 8.5 (*)    Total Protein 6.1 (*)    Albumin 3.0 (*)    AST 139 (*)    ALT 60 (*)    All other components within normal limits  BRAIN NATRIURETIC PEPTIDE - Abnormal; Notable for the following components:   B Natriuretic Peptide 273.2 (*)    All other components within normal limits  TROPONIN I (HIGH SENSITIVITY) - Abnormal; Notable for the following components:   Troponin I (High Sensitivity) 116 (*)    All other components within normal limits  CULTURE, BLOOD (ROUTINE X 2)  CULTURE, BLOOD (ROUTINE X 2)  URINE CULTURE  LACTIC ACID, PLASMA  URINALYSIS, ROUTINE W REFLEX MICROSCOPIC    EKG None  Radiology DG Chest Port 1 View  Result Date:  05/07/2020 CLINICAL DATA:  Atrial fibrillation and fevers EXAM: PORTABLE CHEST 1 VIEW COMPARISON:  None. FINDINGS: Cardiac shadow is mildly enlarged. Patchy airspace opacities are noted in the left base consistent with acute infiltrate. Pleural thickening is noted bilaterally likely related to a subpleural fat. No effusion or pneumothorax is seen. Degenerative changes  of the thoracic spine are noted. IMPRESSION: Patchy left basilar infiltrate. Electronically Signed   By: Inez Catalina M.D.   On: 05/07/2020 23:07    Procedures Procedures (including critical care time)  CRITICAL CARE Performed by: Larene Pickett   Total critical care time: 45 minutes  Critical care time was exclusive of separately billable procedures and treating other patients.  Critical care was necessary to treat or prevent imminent or life-threatening deterioration.  Critical care was time spent personally by me on the following activities: development of treatment plan with patient and/or surrogate as well as nursing, discussions with consultants, evaluation of patient's response to treatment, examination of patient, obtaining history from patient or surrogate, ordering and performing treatments and interventions, ordering and review of laboratory studies, ordering and review of radiographic studies, pulse oximetry and re-evaluation of patient's condition.   Medications Ordered in ED Medications  dexamethasone (DECADRON) injection 10 mg (has no administration in time range)  remdesivir 200 mg in sodium chloride 0.9% 250 mL IVPB (has no administration in time range)  remdesivir 100 mg in sodium chloride 0.9 % 100 mL IVPB (has no administration in time range)  acetaminophen (TYLENOL) tablet 1,000 mg (1,000 mg Oral Given 05/07/20 2243)    ED Course  I have reviewed the triage vital signs and the nursing notes.  Pertinent labs & imaging results that were available during my care of the patient were reviewed by me and  considered in my medical decision making (see chart for details).    MDM Rules/Calculators/A&P      74 year old male presenting to the ED with fever and generalized weakness.  Called EMS today as he was too weak to even walk.  Upon arrival was found to be in A. fib with RVR rate around 180s.  He was given Cardizem bolus and started on drip with rate control.  He is around 100-1 20 on arrival but does have fever.  He is warm to the touch but nontoxic in appearance.  Does appear to have some congestion and a wet cough.  He has been fully vaccinated for Covid he denies any recent exposures.  Will evaluate for source of fever.  Denies any current chest pain or shortness of breath.  12:38 AM covid test is positive.  O2 sats are marginal, between 90-93% on reassessment.  Still intermittently tachy and remains in AFIB.  Labs consistent with Covid with elevated LFTs and thrombocytopenia.  Normal lactate and white count.  Chest x-ray with infiltrate at left base.  Will admit for ongoing care.    O2 continues to waver, dropping as low at 85% but rebounding back to low 90's.  HR is better controlled currently.  Decadron and Remdesivir ordered.  Will admit for ongoing care.  Discussed with hospitalist, Dr. Marlowe Sax-- will admit for ongoing care.  Final Clinical Impression(s) / ED Diagnoses Final diagnoses:  COVID-19  Paroxysmal atrial fibrillation ([place])  Pneumonia due to COVID-19 virus    Rx / DC Orders ED Discharge Orders    None       Larene Pickett, PA-C 05/08/20 0154    Larene Pickett, PA-C 05/08/20 0155    Noemi Chapel, MD 05/08/20 1453

## 2020-05-08 ENCOUNTER — Other Ambulatory Visit: Payer: Self-pay

## 2020-05-08 ENCOUNTER — Inpatient Hospital Stay (HOSPITAL_COMMUNITY): Payer: Medicare Other

## 2020-05-08 ENCOUNTER — Encounter (HOSPITAL_COMMUNITY): Payer: Self-pay | Admitting: Internal Medicine

## 2020-05-08 DIAGNOSIS — Z6841 Body Mass Index (BMI) 40.0 and over, adult: Secondary | ICD-10-CM | POA: Diagnosis not present

## 2020-05-08 DIAGNOSIS — E785 Hyperlipidemia, unspecified: Secondary | ICD-10-CM | POA: Diagnosis present

## 2020-05-08 DIAGNOSIS — U071 COVID-19: Principal | ICD-10-CM

## 2020-05-08 DIAGNOSIS — J1282 Pneumonia due to coronavirus disease 2019: Secondary | ICD-10-CM

## 2020-05-08 DIAGNOSIS — I472 Ventricular tachycardia: Secondary | ICD-10-CM | POA: Diagnosis not present

## 2020-05-08 DIAGNOSIS — K760 Fatty (change of) liver, not elsewhere classified: Secondary | ICD-10-CM | POA: Diagnosis present

## 2020-05-08 DIAGNOSIS — R7303 Prediabetes: Secondary | ICD-10-CM | POA: Diagnosis present

## 2020-05-08 DIAGNOSIS — Z7901 Long term (current) use of anticoagulants: Secondary | ICD-10-CM | POA: Diagnosis not present

## 2020-05-08 DIAGNOSIS — J9601 Acute respiratory failure with hypoxia: Secondary | ICD-10-CM | POA: Diagnosis present

## 2020-05-08 DIAGNOSIS — R778 Other specified abnormalities of plasma proteins: Secondary | ICD-10-CM

## 2020-05-08 DIAGNOSIS — D6959 Other secondary thrombocytopenia: Secondary | ICD-10-CM | POA: Diagnosis present

## 2020-05-08 DIAGNOSIS — I4891 Unspecified atrial fibrillation: Secondary | ICD-10-CM | POA: Diagnosis not present

## 2020-05-08 DIAGNOSIS — I10 Essential (primary) hypertension: Secondary | ICD-10-CM

## 2020-05-08 DIAGNOSIS — M1990 Unspecified osteoarthritis, unspecified site: Secondary | ICD-10-CM | POA: Diagnosis present

## 2020-05-08 DIAGNOSIS — Z86718 Personal history of other venous thrombosis and embolism: Secondary | ICD-10-CM | POA: Diagnosis not present

## 2020-05-08 DIAGNOSIS — I480 Paroxysmal atrial fibrillation: Secondary | ICD-10-CM | POA: Diagnosis present

## 2020-05-08 DIAGNOSIS — G4733 Obstructive sleep apnea (adult) (pediatric): Secondary | ICD-10-CM | POA: Diagnosis present

## 2020-05-08 DIAGNOSIS — E7800 Pure hypercholesterolemia, unspecified: Secondary | ICD-10-CM | POA: Diagnosis present

## 2020-05-08 DIAGNOSIS — A419 Sepsis, unspecified organism: Secondary | ICD-10-CM

## 2020-05-08 LAB — RESPIRATORY PANEL BY RT PCR (FLU A&B, COVID)
Influenza A by PCR: NEGATIVE
Influenza B by PCR: NEGATIVE
SARS Coronavirus 2 by RT PCR: POSITIVE — AB

## 2020-05-08 LAB — PROCALCITONIN: Procalcitonin: 0.33 ng/mL

## 2020-05-08 LAB — BLOOD CULTURE ID PANEL (REFLEXED) - BCID2

## 2020-05-08 LAB — HEMOGLOBIN A1C
Hgb A1c MFr Bld: 5.8 % — ABNORMAL HIGH (ref 4.8–5.6)
Mean Plasma Glucose: 119.76 mg/dL

## 2020-05-08 LAB — BRAIN NATRIURETIC PEPTIDE: B Natriuretic Peptide: 273.2 pg/mL — ABNORMAL HIGH (ref 0.0–100.0)

## 2020-05-08 LAB — LIPID PANEL
Cholesterol: 103 mg/dL (ref 0–200)
HDL: 27 mg/dL — ABNORMAL LOW (ref >40)
LDL Cholesterol: 62 mg/dL (ref 0–99)
Total CHOL/HDL Ratio: 3.8 RATIO
Triglycerides: 71 mg/dL (ref <150)
VLDL: 14 mg/dL (ref 0–40)

## 2020-05-08 LAB — TSH: TSH: 3.69 u[IU]/mL (ref 0.350–4.500)

## 2020-05-08 LAB — ECHOCARDIOGRAM LIMITED
Area-P 1/2: 2.69 cm2
Height: 67 in
S' Lateral: 3.8 cm
Weight: 4313.96 oz

## 2020-05-08 LAB — FIBRINOGEN: Fibrinogen: 359 mg/dL (ref 210–475)

## 2020-05-08 LAB — FERRITIN: Ferritin: 179 ng/mL (ref 24–336)

## 2020-05-08 LAB — LACTATE DEHYDROGENASE: LDH: 270 U/L — ABNORMAL HIGH (ref 98–192)

## 2020-05-08 LAB — MRSA PCR SCREENING: MRSA by PCR: NEGATIVE

## 2020-05-08 LAB — C-REACTIVE PROTEIN: CRP: 9.8 mg/dL — ABNORMAL HIGH (ref <1.0)

## 2020-05-08 LAB — D-DIMER, QUANTITATIVE: D-Dimer, Quant: 0.89 ug/mL-FEU — ABNORMAL HIGH (ref 0.00–0.50)

## 2020-05-08 LAB — TROPONIN I (HIGH SENSITIVITY): Troponin I (High Sensitivity): 102 ng/L (ref <18)

## 2020-05-08 MED ORDER — SODIUM CHLORIDE 0.9 % IV SOLN
100.0000 mg | Freq: Every day | INTRAVENOUS | Status: DC
  Administered 2020-05-09 – 2020-05-10 (×2): 100 mg via INTRAVENOUS
  Filled 2020-05-08 (×2): qty 20

## 2020-05-08 MED ORDER — PREDNISONE 20 MG PO TABS: 50.0000 mg | ORAL_TABLET | Freq: Every day | ORAL | Status: DC

## 2020-05-08 MED ORDER — HYDROCOD POLST-CPM POLST ER 10-8 MG/5ML PO SUER
5.0000 mL | Freq: Two times a day (BID) | ORAL | Status: DC | PRN
  Administered 2020-05-09: 5 mL via ORAL
  Filled 2020-05-08: qty 5

## 2020-05-08 MED ORDER — PNEUMOCOCCAL VAC POLYVALENT 25 MCG/0.5ML IJ INJ
0.5000 mL | INJECTION | INTRAMUSCULAR | Status: DC
  Filled 2020-05-08: qty 0.5

## 2020-05-08 MED ORDER — PERFLUTREN LIPID MICROSPHERE
1.0000 mL | INTRAVENOUS | Status: DC | PRN
  Administered 2020-05-08: 2 mL via INTRAVENOUS
  Filled 2020-05-08: qty 10

## 2020-05-08 MED ORDER — METHYLPREDNISOLONE SODIUM SUCC 125 MG IJ SOLR
0.5000 mg/kg | Freq: Two times a day (BID) | INTRAMUSCULAR | Status: DC
  Administered 2020-05-08: 58.75 mg via INTRAVENOUS
  Filled 2020-05-08: qty 2

## 2020-05-08 MED ORDER — ORAL CARE MOUTH RINSE
15.0000 mL | Freq: Two times a day (BID) | OROMUCOSAL | Status: DC
  Administered 2020-05-09 – 2020-05-10 (×3): 15 mL via OROMUCOSAL

## 2020-05-08 MED ORDER — DILTIAZEM HCL 60 MG PO TABS: 30.0000 mg | ORAL_TABLET | Freq: Four times a day (QID) | ORAL | Status: DC

## 2020-05-08 MED ORDER — ASCORBIC ACID 500 MG PO TABS
500.0000 mg | ORAL_TABLET | Freq: Every day | ORAL | Status: DC
  Administered 2020-05-08 – 2020-05-10 (×3): 500 mg via ORAL
  Filled 2020-05-08 (×3): qty 1

## 2020-05-08 MED ORDER — APIXABAN 5 MG PO TABS
5.0000 mg | ORAL_TABLET | Freq: Two times a day (BID) | ORAL | Status: DC
  Administered 2020-05-08 – 2020-05-10 (×5): 5 mg via ORAL
  Filled 2020-05-08 (×5): qty 1

## 2020-05-08 MED ORDER — DEXAMETHASONE SODIUM PHOSPHATE 10 MG/ML IJ SOLN
10.0000 mg | Freq: Once | INTRAMUSCULAR | Status: AC
  Administered 2020-05-08: 10 mg via INTRAVENOUS
  Filled 2020-05-08: qty 1

## 2020-05-08 MED ORDER — ACETAMINOPHEN 325 MG PO TABS
650.0000 mg | ORAL_TABLET | Freq: Four times a day (QID) | ORAL | Status: DC | PRN
  Administered 2020-05-09: 650 mg via ORAL

## 2020-05-08 MED ORDER — DILTIAZEM HCL-DEXTROSE 125-5 MG/125ML-% IV SOLN (PREMIX)
5.0000 mg/h | INTRAVENOUS | Status: DC
  Filled 2020-05-08: qty 125

## 2020-05-08 MED ORDER — ALBUTEROL SULFATE HFA 108 (90 BASE) MCG/ACT IN AERS
2.0000 | INHALATION_SPRAY | RESPIRATORY_TRACT | Status: DC | PRN
  Filled 2020-05-08: qty 6.7

## 2020-05-08 MED ORDER — METHYLPREDNISOLONE SODIUM SUCC 125 MG IJ SOLR
60.0000 mg | Freq: Two times a day (BID) | INTRAMUSCULAR | Status: DC
  Administered 2020-05-08 – 2020-05-10 (×4): 60 mg via INTRAVENOUS
  Filled 2020-05-08 (×5): qty 2

## 2020-05-08 MED ORDER — ZINC SULFATE 220 (50 ZN) MG PO CAPS
220.0000 mg | ORAL_CAPSULE | Freq: Every day | ORAL | Status: DC
  Administered 2020-05-08 – 2020-05-10 (×3): 220 mg via ORAL
  Filled 2020-05-08 (×3): qty 1

## 2020-05-08 MED ORDER — DILTIAZEM HCL 60 MG PO TABS
30.0000 mg | ORAL_TABLET | Freq: Four times a day (QID) | ORAL | Status: DC
  Administered 2020-05-08 – 2020-05-09 (×5): 30 mg via ORAL
  Filled 2020-05-08 (×5): qty 1

## 2020-05-08 MED ORDER — GUAIFENESIN-DM 100-10 MG/5ML PO SYRP: 10.0000 mL | ORAL_SOLUTION | ORAL | Status: DC | PRN

## 2020-05-08 MED ORDER — ENOXAPARIN SODIUM 40 MG/0.4ML ~~LOC~~ SOLN: 40.0000 mg | SUBCUTANEOUS | Status: DC

## 2020-05-08 MED ORDER — SODIUM CHLORIDE 0.9 % IV SOLN
200.0000 mg | Freq: Once | INTRAVENOUS | Status: AC
  Administered 2020-05-08: 200 mg via INTRAVENOUS
  Filled 2020-05-08: qty 40

## 2020-05-08 MED ORDER — VITAMIN D 25 MCG (1000 UNIT) PO TABS
1000.0000 [IU] | ORAL_TABLET | Freq: Every day | ORAL | Status: DC
  Administered 2020-05-08 – 2020-05-10 (×3): 1000 [IU] via ORAL
  Filled 2020-05-08 (×3): qty 1

## 2020-05-08 NOTE — Progress Notes (Signed)
PHARMACY - PHYSICIAN COMMUNICATION CRITICAL VALUE ALERT - BLOOD CULTURE IDENTIFICATION (BCID)  GPC>staph epidermidis MEC A resistance detected growing in 1/4 bottles (anaerobic)  Name of physician (or Provider) Contacted: Gherghe  Changes to prescribed antibiotics required: Likely contaminant, defer treatment at this time.   Erin Hearing PharmD., BCPS Clinical Pharmacist 05/08/2020 5:33 PM

## 2020-05-08 NOTE — Progress Notes (Signed)
Shane Diaz was admitted to 5w17 from the ED via stretcher.  The patient is alert and oriented x4.  Pt is in no acute distress, on RA.  Diltiazem gtt ordered, pt noted to be NSR with occasional runs of ST, paged V. Rathore, MD who ordered EKG, awaiting additional orders regarding initiation of gtt.  Bed is in the lowest position, bed alarm is activated, call bell and telephone are within reach.  Explained to patient how to use call bell and telephone, patient indicated understanding.  Admission booklet left at bedside.  Pt reports no further needs at this time.

## 2020-05-08 NOTE — ED Notes (Addendum)
Date and time results received: 05/08/20 2352  Test: Troponin Critical Value: 116  Name of Provider Notified: Purvis Sheffield, MD  No further orders at this time

## 2020-05-08 NOTE — ED Notes (Signed)
Date and time results received: 05/08/20 0030 Test: COVID Critical Value: POSTIVE Name of Provider Notified: Baird Cancer, PA  Order to be written.

## 2020-05-08 NOTE — Progress Notes (Addendum)
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  Upon admission he was found to be in A. fib with RVR with heart rate in the 180s.  He was also found to be Covid positive and a chest x-ray showed patchy left basilar infiltrate.  He was started on steroids, remdesivir, was placed on diltiazem infusion and was admitted to the hospital.   He is doing well this morning, feels a lot better and close to normal.  Has not yet been up and walking.  BP 121/79 (BP Location: Left Arm)   Pulse 73   Temp 97.7 F (36.5 C) (Oral)   Resp 18   Ht 5\' 7"  (1.702 m)   Wt 122.3 kg   SpO2 93%   BMI 42.23 kg/m   Acute hypoxic respiratory failure due to COVID-19 pneumonia, sepsis ruled out -Patient was started on remdesivir, continue for 5 days -Started on steroids, continue -Already feels better this morning, looks like there is mild pneumonia, requiring 2 L nasal cannula only.  Clinically he looks good.  Will ambulate and see how he does  Paroxysmal A. Fib -Already anticoagulated with Eliquis, he was initially on diltiazem infusion, converted to sinus rhythm and he was placed on diltiazem p.o.  He is in sinus on my evaluation in the room, continue diltiazem, consolidate to once daily dosing when closer to discharge  Essential hypertension -Continue diltiazem, monitor  Hyperlipidemia -Not on statin, hold given mildly elevated LFTs, lipid panel shows hypercholesterolemia.  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 fib with RVR.  Scheduled Meds: . apixaban  5 mg Oral BID  . vitamin C  500 mg Oral Daily  . cholecalciferol  1,000 Units Oral Daily  . diltiazem  30 mg Oral Q6H  . methylPREDNISolone (SOLU-MEDROL) injection  0.5 mg/kg Intravenous Q12H   Followed by  . [START ON 05/11/2020] predniSONE  50 mg Oral Daily  . [START ON 05/09/2020] pneumococcal 23 valent vaccine  0.5 mL Intramuscular Tomorrow-1000  . zinc sulfate  220 mg Oral Daily   Continuous Infusions: . [START ON 05/09/2020] remdesivir 100 mg in NS 100 mL     PRN Meds:.acetaminophen, albuterol, chlorpheniramine-HYDROcodone, guaiFENesin-dextromethorphan  Patient's cell phone number is [phone], he refused at home and asked him to call this number to discuss with his wife who does not speak [place].  I have also called [phone], no answer to any of the phone.  Patient asked me not to contact the daughter but only the wife  Harvest Deist M. Cruzita Lederer, MD, PhD [facility]  Between 7 am - 7 pm you can contact me via [place] or [place].  I am not available 7 pm - 7 am, please contact night coverage MD/APP via [place]

## 2020-05-08 NOTE — Progress Notes (Signed)
  Echocardiogram 2D Echocardiogram limited with definity has been performed.  [name] M 05/08/2020, 10:10 AM

## 2020-05-08 NOTE — H&P (Addendum)
History and Physical    Shane Diaz TLX:[id number] DOB: 1945/08/11 DOA: 05/07/2020  PCP: Shane Panda, MD Patient coming from: Home  Chief Complaint: Fever, generalized weakness  HPI: Shane Diaz is a 74 y.o. 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  Upon EMS arrival, he was found to be in A. fib with rate in the 180s.  He was given a Cardizem bolus followed by drip and 600 cc IV fluid.    Patient states he is feeling unwell for the past 3 to 4 days.  He is having fevers, generalized weakness, cough, and shortness of breath.  Today he felt very dizzy and had to call EMS.  He was vaccinated against Covid back in March.  Denies chest pain or palpitations.  Denies nausea, vomiting, abdominal pain, or diarrhea.   ED Course: Temperature 99.9 F.  A. fib with heart rate 100-120 on arrival to the ED.  Tachypneic with respiratory rate in the 30s.  Oxygen saturation 90-93% at rest but intermittently dropping down to mid 80s.  SARS-CoV-2 PCR test positive.  Influenza panel negative.  WBC 5.8. Hemoglobin 17.8 and hematocrit 53.4 (at baseline).  Platelet count 122K.  Sodium 132, potassium 4.3, chloride 97, bicarb 23, BUN 17, creatinine 1.0, glucose 119.  AST 139, ALT 60, alk phos 60, T bili 1.1.  Lactic acid 1.4.  High sensitive troponin 116.  EKG with new RBBB.  BNP 273.  Blood culture x2 pending.  UA and urine culture pending.  Chest x-ray showing patchy left basilar infiltrate. Patient was given Tylenol, IV Decadron 10 mg, and remdesivir.  Review of Systems:  All systems reviewed and apart from history of presenting illness, are negative.  Past Medical History:  Diagnosis Date  . Arthritis   . Diverticulosis 05/2015   Mild, noted on colonosocpy  . Fatty liver 05/14/2015    noted on [place] ABD  . History of colon polyps    sessile in ascending colon, pedunculated in sigmoid colon  . Hyperlipidemia    pt denies  . Hypertension    not currently taking medications per MD  . 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 Umbilical hernia   . Varicose veins     Past Surgical History:  Procedure Laterality Date  . APPENDECTOMY     childhood  . COLONOSCOPY W/ POLYPECTOMY  05/23/2014  . UMBILICAL HERNIA REPAIR N/A 04/27/2018   Procedure: REPAIR UMBILICAL HERNIA ERAS PATHWAY;  Surgeon: Alphonsa Overall, MD;  Location: [facility];  Service: General;  Laterality: N/A;  . varicose veins    . WRIST SURGERY       reports that he has never smoked. He has never used smokeless tobacco. He reports current alcohol use. He reports that he does not use drugs.  No Known Allergies  Family History  Problem Relation Age of Onset  . Colon cancer Neg Hx   . Esophageal cancer Neg Hx   . Pancreatic cancer Neg Hx   . Prostate cancer Neg Hx   . Rectal cancer Neg Hx   . Stomach cancer Neg Hx     Prior to Admission medications   Medication Sig Start Date End Date Taking? Authorizing Provider  apixaban (ELIQUIS) 5 MG TABS tablet Take 1 tablet (5 mg total) by mouth 2 (two) times daily.  06/01/19   [name], [name], MD  HYDROcodone-acetaminophen (NORCO/VICODIN) 5-325 MG tablet Take 1 tablet by mouth every 6 (six) hours as needed for moderate pain. 04/27/18   Alphonsa Overall, MD    Physical Exam: Vitals:   05/08/20 0030 05/08/20 0045 05/08/20 0100 05/08/20 0112  BP:      Pulse: 94 98 84   Resp: (!) 28 (!) 29 (!) 30   Temp:      TempSrc:      SpO2: 92% 92% (!) 85% 90%  Weight:      Height:        Physical Exam Constitutional:      Appearance: He is ill-appearing and diaphoretic.  HENT:     Head: Normocephalic and atraumatic.  Eyes:     Extraocular Movements: Extraocular movements intact.     Conjunctiva/sclera: Conjunctivae normal.  Cardiovascular:     Rate and Rhythm:  Regular rhythm. Tachycardia present.     Pulses: Normal pulses.     Comments: In A. fib with rate up to 140s Pulmonary:     Effort: Pulmonary effort is normal.     Breath sounds: No wheezing or rales.     Comments: Respiratory rate in the mid 20s Abdominal:     General: Bowel sounds are normal. There is distension.     Palpations: Abdomen is soft.     Tenderness: There is no abdominal tenderness. There is no guarding or rebound.  Musculoskeletal:        General: No tenderness.     Cervical back: Normal range of motion and neck supple.     Comments: Trace pitting edema of bilateral lower extremities  Skin:    General: Skin is warm.  Neurological:     General: No focal deficit present.     Mental Status: He is alert and oriented to person, place, and time.     Labs on Admission: I have personally reviewed following labs and imaging studies  CBC: Recent Labs  Lab 05/07/20 2230  WBC 5.8  NEUTROABS 4.1  HGB 17.8*  HCT 53.4*  MCV 99.8  PLT 284*   Basic Metabolic Panel: Recent Labs  Lab 05/07/20 2230  NA 132*  K 4.3  CL 97*  CO2 23  GLUCOSE 119*  BUN 17  CREATININE 1.06  CALCIUM 8.5*   GFR: Estimated Creatinine Clearance: 75.1 mL/min (by C-G formula based on SCr of 1.06 mg/dL). Liver Function Tests: Recent Labs  Lab 05/07/20 2230  AST 139*  ALT 60*  ALKPHOS 60  BILITOT 1.1  PROT 6.1*  ALBUMIN 3.0*   No results for input(s): LIPASE, AMYLASE in the last 168 hours. No results for input(s): AMMONIA in the last 168 hours. Coagulation Profile: No results for input(s): INR, PROTIME in the last 168 hours. Cardiac Enzymes: No results for input(s): CKTOTAL, CKMB, CKMBINDEX, TROPONINI in the last 168 hours. BNP (last 3 results) No results for input(s): PROBNP in the last 8760 hours. HbA1C: No results for input(s): HGBA1C in the last 72 hours. CBG: No results for input(s): GLUCAP in the last 168 hours. Lipid Profile: No results for input(s): CHOL, HDL, LDLCALC,  TRIG, CHOLHDL, LDLDIRECT in the last 72 hours. Thyroid Function Tests: No results for input(s): TSH, T4TOTAL, FREET4, T3FREE, THYROIDAB in the last 72 hours. Anemia Panel: No results for input(s): VITAMINB12, FOLATE, FERRITIN, TIBC, IRON, RETICCTPCT in the last 72 hours. Urine analysis: No results found for: COLORURINE, APPEARANCEUR, [place], [place], [place], [place], [place], [place], [place], [place], [place], [place]  Exams on Admission: DG Chest Port 1 View  Result Date: 05/07/2020 CLINICAL DATA:  Atrial fibrillation and fevers EXAM: PORTABLE CHEST 1 VIEW COMPARISON:  None. FINDINGS: Cardiac shadow is mildly enlarged. Patchy airspace opacities are noted in the left base consistent with acute infiltrate. Pleural thickening is noted bilaterally likely related to a subpleural fat. No effusion or pneumothorax is seen. Degenerative changes of the thoracic spine are noted. IMPRESSION: Patchy left basilar infiltrate. Electronically Signed   By: Inez Catalina M.D.   On: 05/07/2020 23:07    EKG: Independently reviewed.  A. fib, heart rate 110.  New RBBB.  Assessment/Plan Principal Problem:   Pneumonia due to COVID-19 virus Active Problems:   Sepsis ([place])   Atrial fibrillation with rapid ventricular response ([facility])   Elevated troponin   Essential hypertension   Sepsis and acute hypoxemic respiratory failure secondary to COVID-19 viral pneumonia: Currently satting 93-94% on room air at rest but it is reported that his sats were intermittently dropping down to mid 80s earlier.  SARS-CoV-2 PCR test positive.  Meets SIRS criteria (low-grade fever, tachycardia, tachypnea) and has a source of infection. Chest x-ray showing patchy left basilar infiltrate.  No signs of severe sepsis -lactic acid level normal and no hypotension. -Remdesivir -IV Solu-Medrol 0.5 mg/kg every 12 hours -Vitamin C, zinc, vitamin D -Antitussives as needed -Tylenol as needed -Bronchodilator as  needed -Check inflammatory markers including ferritin, fibrinogen, D-dimer, CRP, LDH -Check procalcitonin level -Airborne and contact precautions -Incentive spirometry, flutter valve -Encourage prone positioning -Continuous pulse ox -Supplemental oxygen as needed to keep oxygen saturation above 90% -Blood culture x2 pending  New onset A. fib with RVR: Likely precipitating factor is acute viral illness.  PE is also possibility given COVID-19 infection.  Rate initially in the 180s with EMS.  Received Cardizem bolus followed by drip by EMS.  In the ED, Cardizem drip was stopped as he was initially rate controlled.  Currently rate back up to 140s.  Blood pressure stable. -Start Cardizem infusion for rate control.  CHA2DS2VASc at least 3.  Already on chronic anticoagulation with Eliquis due to history of bilateral lower extremity DVTs.  Resume Eliquis after pharmacy med rec is done.  Check TSH level.  Order echocardiogram.  Check D-dimer level, will be elevated to some extent given Covid infection. If significantly elevated, order CT angiogram to rule out PE.  Addendum: Patient converted to sinus rhythm before Cardizem drip was started.  P.o. Cardizem has been ordered.  Elevated troponin and EKG changes: High-sensitivity troponin 116.  EKG with new RBBB.  Patient denies chest pain.  Suspect troponin elevation is due to demand ischemia in the setting of A. fib with RVR.  BNP slightly elevated at 273 but no signs of volume overload clinically.  Not able to see prior echo results in the chart but per previous cardiology office visit note he does have a history of diastolic dysfunction. -Cardiac monitoring, trend troponin, order echocardiogram  Hypertension: Stable.  Currently normotensive. -Cardizem drip as mentioned above  Hyperlipidemia: Not on a statin. -Order lipid panel  History of prediabetes -Check A1c  History of bilateral lower extremity DVTs on lifelong anticoagulation -Resume home  Eliquis after pharmacy med rec is done  Mildly elevated transaminases: Likely due to acute viral illness. -Continue to monitor LFTs  Mild thrombocytopenia: Likely due to acute viral illness. -Continue to monitor platelet count  DVT prophylaxis: Lovenox Code Status: Patient wishes to be DNR. Family Communication: No family available at this time. Disposition Plan:  Status is: Inpatient  Remains inpatient appropriate because:IV treatments appropriate due to intensity of illness or inability to take PO and Inpatient level of care appropriate due to severity of illness   Dispo: The patient is from: Home              Anticipated d/c is to: Home              Anticipated d/c date is: > 3 days              Patient currently is not medically stable to d/c.  The medical decision making on this patient was of high complexity and the patient is at high risk for clinical deterioration, therefore this is a level 3 visit.  Shela Leff MD [facility]  If 7PM-7AM, please contact night-coverage [email or web address]  05/08/2020, 2:06 AM

## 2020-05-08 NOTE — ED Notes (Signed)
Admitting provider at bedside.

## 2020-05-08 NOTE — Plan of Care (Signed)

## 2020-05-08 NOTE — Progress Notes (Signed)
   05/08/20 0326  Assess: MEWS Score  Temp 98.7 F (37.1 C)  BP 128/83  Pulse Rate 83  ECG Heart Rate 82  Resp (!) 23 (RR reassessed manually)  Level of Consciousness Alert  SpO2 94 %  O2 Device Room Air  Patient Activity (if Appropriate) In bed  Assess: MEWS Score  MEWS Temp 0  MEWS Systolic 0  MEWS Pulse 0  MEWS RR 1  MEWS LOC 0  MEWS Score 1  MEWS Score Color Green  Assess: if the MEWS score is Yellow or Red  Were vital signs taken at a resting state? Yes  Focused Assessment No change from prior assessment  Early Detection of Sepsis Score *See Row Information* Low  MEWS guidelines implemented *See Row Information* No, other (Comment) (no acute changes)  Treat  Pain Scale 0-10  Pain Score 0  Document  Progress note created (see row info) Yes

## 2020-05-08 NOTE — Progress Notes (Signed)
   05/08/20 2000  Assess: MEWS Score  Temp 98.2 F (36.8 C)  BP 120/74  Pulse Rate 77  ECG Heart Rate 76  Resp 18  Level of Consciousness Alert  SpO2 90 %  O2 Device Nasal Cannula  Patient Activity (if Appropriate) In bed  O2 Flow Rate (L/min) 2 L/min  Assess: MEWS Score  MEWS Temp 0  MEWS Systolic 0  MEWS Pulse 0  MEWS RR 0  MEWS LOC 0  MEWS Score 0  MEWS Score Color Green  Assess: if the MEWS score is Yellow or Red  Were vital signs taken at a resting state? Yes  Focused Assessment No change from prior assessment  Early Detection of Sepsis Score *See Row Information* Low  MEWS guidelines implemented *See Row Information* No, other (Comment) (no acute changes)  Document  Progress note created (see row info) Yes

## 2020-05-08 NOTE — Discharge Instructions (Addendum)
Patient scheduled for outpatient Remdesivir infusions at 9am on Friday 10/8 and Saturday 10/9 at [facility]. Please inform the patient to park at [place], as staff will be escorting the patient through the [place] entrance of the hospital. Appointments take approximately 45 minutes.    There is a wave flag banner located near the entrance on [address]. Turn into this entrance and immediately turn left or right and park in 1 of the 10 designated Covid Infusion Parking spots. There is a phone number on the sign, please call and let the staff know what spot you are in and we will come out and get you. For questions call [phone].  Thanks.     Information on my medicine - ELIQUIS (apixaban)  Why was Eliquis prescribed for you? Eliquis was prescribed to treat blood clots that may have previously been found in the veins of your legs (deep vein thrombosis) or in your lungs (pulmonary embolism) and to reduce the risk of them occurring again.  What do You need to know about Eliquis ? Your current dose is ONE 5 mg tablet taken TWICE daily.  Eliquis may be taken with or without food.   Try to take the dose about the same time in the morning and in the evening. If you have difficulty swallowing the tablet whole please discuss with your pharmacist how to take the medication safely.  Take Eliquis exactly as prescribed and DO NOT stop taking Eliquis without talking to the doctor who prescribed the medication.  Stopping may increase your risk of developing a new blood clot.  Refill your prescription before you run out.  After discharge, you should have regular check-up appointments with your healthcare provider that is prescribing your Eliquis.    What do you do if you miss a dose? If a dose of ELIQUIS is not taken at the scheduled time, take it as soon as possible on the same day and twice-daily administration should be resumed. The dose should not be doubled to make  up for a missed dose.  Important Safety Information A possible side effect of Eliquis is bleeding. You should call your healthcare provider right away if you experience any of the following: ? Bleeding from an injury or your nose that does not stop. ? Unusual colored urine (red or dark brown) or unusual colored stools (red or black). ? Unusual bruising for unknown reasons. ? A serious fall or if you hit your head (even if there is no bleeding).  Some medicines may interact with Eliquis and might increase your risk of bleeding or clotting while on Eliquis. To help avoid this, consult your healthcare provider or pharmacist prior to using any new prescription or non-prescription medications, including herbals, vitamins, non-steroidal anti-inflammatory drugs (NSAIDs) and supplements.  This website has more information on Eliquis (apixaban): http://www.eliquis.com/eliquis/home

## 2020-05-08 NOTE — ED Notes (Signed)
Awaiting remdesivir from pharmacy

## 2020-05-09 DIAGNOSIS — I4891 Unspecified atrial fibrillation: Secondary | ICD-10-CM

## 2020-05-09 LAB — COMPREHENSIVE METABOLIC PANEL
ALT: 53 U/L — ABNORMAL HIGH (ref 0–44)
AST: 79 U/L — ABNORMAL HIGH (ref 15–41)
Albumin: 2.8 g/dL — ABNORMAL LOW (ref 3.5–5.0)
Alkaline Phosphatase: 59 U/L (ref 38–126)
Anion gap: 11 (ref 5–15)
BUN: 28 mg/dL — ABNORMAL HIGH (ref 8–23)
CO2: 25 mmol/L (ref 22–32)
Calcium: 8.8 mg/dL — ABNORMAL LOW (ref 8.9–10.3)
Chloride: 98 mmol/L (ref 98–111)
Creatinine, Ser: 1.02 mg/dL (ref 0.61–1.24)
GFR calc non Af Amer: >60 mL/min (ref >60)
Glucose, Bld: 172 mg/dL — ABNORMAL HIGH (ref 70–99)
Potassium: 4.2 mmol/L (ref 3.5–5.1)
Sodium: 134 mmol/L — ABNORMAL LOW (ref 135–145)
Total Bilirubin: 0.5 mg/dL (ref 0.3–1.2)
Total Protein: 6 g/dL — ABNORMAL LOW (ref 6.5–8.1)

## 2020-05-09 LAB — CBC WITH DIFFERENTIAL/PLATELET
Abs Immature Granulocytes: 0.03 10*3/uL (ref 0.00–0.07)
Basophils Absolute: 0 10*3/uL (ref 0.0–0.1)
Basophils Relative: 0 %
Eosinophils Absolute: 0 10*3/uL (ref 0.0–0.5)
Eosinophils Relative: 0 %
HCT: 54.2 % — ABNORMAL HIGH (ref 39.0–52.0)
Hemoglobin: 17.6 g/dL — ABNORMAL HIGH (ref 13.0–17.0)
Immature Granulocytes: 1 %
Lymphocytes Relative: 22 %
Lymphs Abs: 1.2 10*3/uL (ref 0.7–4.0)
MCH: 33 pg (ref 26.0–34.0)
MCHC: 32.5 g/dL (ref 30.0–36.0)
MCV: 101.5 fL — ABNORMAL HIGH (ref 80.0–100.0)
Monocytes Absolute: 0.3 10*3/uL (ref 0.1–1.0)
Monocytes Relative: 6 %
Neutro Abs: 3.8 10*3/uL (ref 1.7–7.7)
Neutrophils Relative %: 71 %
Platelets: 143 10*3/uL — ABNORMAL LOW (ref 150–400)
RBC: 5.34 MIL/uL (ref 4.22–5.81)
RDW: 12.9 % (ref 11.5–15.5)
WBC: 5.3 10*3/uL (ref 4.0–10.5)
nRBC: 0 % (ref 0.0–0.2)

## 2020-05-09 LAB — GLUCOSE, CAPILLARY
Glucose-Capillary: 224 mg/dL — ABNORMAL HIGH (ref 70–99)
Glucose-Capillary: 207 mg/dL — ABNORMAL HIGH (ref 70–99)

## 2020-05-09 LAB — C-REACTIVE PROTEIN: CRP: 6 mg/dL — ABNORMAL HIGH (ref <1.0)

## 2020-05-09 LAB — D-DIMER, QUANTITATIVE: D-Dimer, Quant: 0.73 ug/mL-FEU — ABNORMAL HIGH (ref 0.00–0.50)

## 2020-05-09 LAB — MAGNESIUM: Magnesium: 1.7 mg/dL (ref 1.7–2.4)

## 2020-05-09 MED ORDER — MAGNESIUM SULFATE 2 GM/50ML IV SOLN
2.0000 g | Freq: Once | INTRAVENOUS | Status: AC
  Administered 2020-05-09: 2 g via INTRAVENOUS
  Filled 2020-05-09: qty 50

## 2020-05-09 MED ORDER — METOPROLOL TARTRATE 25 MG PO TABS
25.0000 mg | ORAL_TABLET | Freq: Two times a day (BID) | ORAL | Status: DC
  Administered 2020-05-09 – 2020-05-10 (×3): 25 mg via ORAL
  Filled 2020-05-09 (×3): qty 1

## 2020-05-09 MED ORDER — INSULIN ASPART 100 UNIT/ML ~~LOC~~ SOLN
0.0000 [IU] | Freq: Three times a day (TID) | SUBCUTANEOUS | Status: DC
  Administered 2020-05-09: 5 [IU] via SUBCUTANEOUS
  Administered 2020-05-10: 2 [IU] via SUBCUTANEOUS

## 2020-05-09 NOTE — Progress Notes (Signed)
SATURATION QUALIFICATIONS: (This note is used to comply with regulatory documentation for home oxygen)  Patient Saturations on Room Air at Rest = 90%  Patient Saturations on Room Air while Ambulating = 84%  Patient Saturations on 2 Liters of oxygen while Ambulating = 90%  Please briefly explain why patient needs home oxygen:

## 2020-05-09 NOTE — Progress Notes (Signed)
CCMD called staff and informed that patient had multifocal PVCs - 23/min frequency. Patient is resting in bed, asymptomatic. MD notified. Will continue to monitor.

## 2020-05-09 NOTE — Progress Notes (Signed)
CCMD called and informed staff that patient had 8 beats of V-tach. Patient is asymptomatic, resting in bed, no complaints. MD made aware. Will continue to monitor.

## 2020-05-09 NOTE — Progress Notes (Signed)
PROGRESS NOTE                                                                                                                                                                                                             Patient Demographics:    Shane Diaz, is a 74 y.o. male, DOB - 1946/06/28, LSL:[id number]  Admit date - 05/07/2020   Admitting Physician Shela Leff, MD  Outpatient Primary MD for the patient is Shane Panda, MD  LOS - 1   Chief Complaint  Patient presents with  . Weakness  . Fever       Brief Narrative       Subjective:    Shane Diaz today reports he is feeling better, dyspnea has improved, denies any chest pain, nausea or vomiting.     Assessment  & Plan :    Principal Problem:   Pneumonia due to COVID-19 virus Active Problems:   Sepsis ([place])   Atrial fibrillation with rapid ventricular response (HCC)   Elevated troponin   Essential hypertension   Acute hypoxic respiratory failure due to COVID-19 pneumonia, sepsis ruled out -He is vaccinated -This morning he is on room air at rest, but he is requiring up to 2 L with activity. -Patient was started on remdesivir, continue for 5 days -Started on steroids, continue -Was encouraged to use incentive spirometry and flutter valve  Paroxysmal A. Fib -Already anticoagulated with Eliquis, he was initially on diltiazem infusion, converted to sinus rhythm and he was placed on diltiazem p.o. I have switched him to p.o. metoprolol today given he had 8 beats of NSVT. -He is on Eliquis already.  NSVT -This morning patient had 8 beats of nonsustained V. tach, 2D echo with a preserved EF, potassium is 4.2, magnesium is 1.7, will give 1 g, he is started on beta-blockers as well.     Essential hypertension -Cardizem to metoprolol  Hyperlipidemia -Not on statin, hold given mildly elevated LFTs, lipid panel shows hypercholesterolemia.  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 fib with RVR.   COVID-19 Labs  Recent Labs    05/08/20 0335 05/09/20 0527  DDIMER 0.89* 0.73*  FERRITIN 179  --   LDH 270*  --   CRP 9.8* 6.0*    Lab Results  Component Value Date   SARSCOV2NAA POSITIVE (A) 05/07/2020     Code Status : Full  Family Communication  : D/W patient  Disposition Plan  :  Status is: Inpatient  Remains inpatient appropriate because:Hemodynamically unstable and IV treatments appropriate due to intensity of illness or inability to take PO   Dispo: The patient is from: Home              Anticipated d/c is to: Home              Anticipated d/c date is: 2 days              Patient currently is not medically stable to d/c.        Barriers For Discharge :   Consults  :  none  Procedures  : none  DVT Prophylaxis  :  Eliquis  Lab Results  Component Value Date   PLT 143 (L) 05/09/2020    Antibiotics  :   Anti-infectives (From admission, onward)   Start     Dose/Rate Route Frequency Ordered Stop   05/09/20 1000  remdesivir 100 mg in sodium chloride 0.9 % 100 mL IVPB        100 mg 200 mL/hr over 30 Minutes Intravenous Daily 05/08/20 0041 05/13/20 0959   05/08/20 0045  remdesivir 200 mg in sodium chloride 0.9% 250 mL IVPB        200 mg 580 mL/hr over 30 Minutes Intravenous Once 05/08/20 0040 05/08/20 0303        Objective:   Vitals:   05/09/20 0010 05/09/20 0406 05/09/20 0800 05/09/20 1237  BP: (!) 141/90 133/85 133/87 (!) 145/94  Pulse: 71 66 65 71  Resp: (!) 23 15 20 18   Temp: 98.2 F (36.8 C)  97.9 F (36.6 C) 98.2 F (36.8 C) 98.4 F (36.9 C)  TempSrc: Oral Oral Oral Oral  SpO2: 93% 95% 94% 92%  Weight:      Height:        Wt Readings from Last 3 Encounters:  05/08/20 122.3 kg  10/13/19 127.7 kg  08/31/19 131.1 kg     Intake/Output Summary (Last 24 hours) at 05/09/2020 1552 Last data filed at 05/09/2020 1153 Gross per 24 hour  Intake 520 ml  Output --  Net 520 ml     Physical Exam  Awake Alert, Oriented X 3, No new F.N deficits, Normal affect Symmetrical Chest wall movement, Good air movement bilaterally, CTAB RRR,No Gallops,Rubs or new Murmurs, No Parasternal Heave +ve B.Sounds, Abd Soft, No tenderness, No rebound - guarding or rigidity. No Cyanosis, Clubbing or edema, No new Rash or bruise      Data Review:    CBC Recent Labs  Lab 05/07/20 2230 05/09/20 0527  WBC 5.8 5.3  HGB 17.8* 17.6*  HCT 53.4* 54.2*  PLT 122* 143*  MCV 99.8 101.5*  MCH 33.3 33.0  MCHC 33.3 32.5  RDW 12.8 12.9  LYMPHSABS 1.2 1.2  MONOABS 0.4 0.3  EOSABS 0.0 0.0  BASOSABS 0.0 0.0    Chemistries  Recent Labs  Lab 05/07/20 2230 05/09/20 0527 05/09/20 1400  NA 132* 134*  --   K 4.3  4.2  --   CL 97* 98  --   CO2 23 25  --   GLUCOSE 119* 172*  --   BUN 17 28*  --   CREATININE 1.06 1.02  --   CALCIUM 8.5* 8.8*  --   MG  --   --  1.7  AST 139* 79*  --   ALT 60* 53*  --   ALKPHOS 60 59  --   BILITOT 1.1 0.5  --    ------------------------------------------------------------------------------------------------------------------ Recent Labs    05/08/20 0335  CHOL 103  HDL 27*  LDLCALC 62  TRIG 71  CHOLHDL 3.8    Lab Results  Component Value Date   HGBA1C 5.8 (H) 05/08/2020   ------------------------------------------------------------------------------------------------------------------ Recent Labs    05/08/20 0335  TSH 3.690    ------------------------------------------------------------------------------------------------------------------ Recent Labs    05/08/20 0335  FERRITIN 179    Coagulation profile No results for input(s): INR, PROTIME in the last 168 hours.  Recent Labs    05/08/20 0335 05/09/20 0527  DDIMER 0.89* 0.73*    Cardiac Enzymes No results for input(s): CKMB, TROPONINI, MYOGLOBIN in the last 168 hours.  Invalid input(s): CK ------------------------------------------------------------------------------------------------------------------    Component Value Date/Time   BNP 273.2 (H) 05/07/2020 2230    Inpatient Medications  Scheduled Meds: . apixaban  5 mg Oral BID  . vitamin C  500 mg Oral Daily  . cholecalciferol  1,000 Units Oral Daily  . mouth rinse  15 mL Mouth Rinse BID  . methylPREDNISolone (SOLU-MEDROL) injection  60 mg Intravenous Q12H   Followed by  . [START ON 05/11/2020] predniSONE  50 mg Oral Daily  . metoprolol tartrate  25 mg Oral BID  . pneumococcal 23 valent vaccine  0.5 mL Intramuscular Tomorrow-1000  . zinc sulfate  220 mg Oral Daily   Continuous Infusions: . remdesivir 100 mg in NS 100 mL 100 mg (05/09/20 0937)   PRN Meds:.acetaminophen, albuterol, chlorpheniramine-HYDROcodone, guaiFENesin-dextromethorphan  Micro Results Recent Results (from the past 240 hour(s))  Respiratory Panel by RT PCR (Flu A&B, Covid) - Nasopharyngeal Swab     Status: Abnormal   Collection Time: 05/07/20 10:30 PM   Specimen: Nasopharyngeal Swab  Result Value Ref Range Status   SARS Coronavirus 2 by RT PCR POSITIVE (A) NEGATIVE Final    Comment: RESULT CALLED TO, READ BACK BY AND VERIFIED WITH: J FERGUSON RN 05/08/20 AT 0030 SK (NOTE) SARS-CoV-2 target nucleic acids are DETECTED.  SARS-CoV-2 RNA is generally detectable in upper respiratory specimens  during the acute phase of infection. Positive results are indicative of the presence of the identified virus, but do not  rule out bacterial infection or co-infection with other pathogens not detected by the test. Clinical correlation with patient history and other diagnostic information is necessary to determine patient infection status. The expected result is Negative.  Fact Sheet for Patients:  PinkCheek.be  Fact Sheet for Healthcare Providers: GravelBags.it  This test is not yet approved or cleared by the [place] FDA and  has been authorized for detection and/or diagnosis of SARS-CoV-2 by FDA under an Emergency Use Authorization (EUA).  This EUA will remain in effect (meaning this test can be [place] ed) for the duration of  the COVID-19 declaration under Section 564(b)(1) of the Act, 21 U.S.C. section 360bbb-3(b)(1), unless the authorization is terminated or revoked sooner.      Influenza A by PCR NEGATIVE NEGATIVE Final   Influenza B by PCR NEGATIVE NEGATIVE Final    Comment: (NOTE) The  Xpert Xpress SARS-CoV-2/FLU/RSV assay is intended as an aid in  the diagnosis of influenza from Nasopharyngeal swab specimens and  should not be used as a sole basis for treatment. Nasal washings and  aspirates are unacceptable for Xpert Xpress SARS-CoV-2/FLU/RSV  testing.  Fact Sheet for Patients: PinkCheek.be  Fact Sheet for Healthcare Providers: GravelBags.it  This test is not yet approved or cleared by the [place] FDA and  has been authorized for detection and/or diagnosis of SARS-CoV-2 by  FDA under an Emergency Use Authorization (EUA). This EUA will remain  in effect (meaning this test can be used) for the duration of the  Covid-19 declaration under Section 564(b)(1) of the Act, 21  U.S.C. section 360bbb-3(b)(1), unless the authorization is  terminated or revoked. Performed at [facility], [place] [address], [place], [place] [address]   Blood culture (routine x 2)      Status: Abnormal (Preliminary result)   Collection Time: 05/07/20 10:55 PM   Specimen: BLOOD  Result Value Ref Range Status   Specimen Description BLOOD RIGHT ANTECUBITAL  Final   Special Requests   Final    BOTTLES DRAWN AEROBIC AND ANAEROBIC Blood Culture adequate volume   Culture  Setup Time   Final    GRAM POSITIVE COCCI IN BOTH AEROBIC AND ANAEROBIC BOTTLES CRITICAL RESULT CALLED TO, READ BACK BY AND VERIFIED WITH: PHARMD F WILSON 05/08/20 AT 58 SK    Culture (A)  Final    STAPHYLOCOCCUS EPIDERMIDIS THE SIGNIFICANCE OF ISOLATING THIS ORGANISM FROM A SINGLE SET OF BLOOD CULTURES WHEN MULTIPLE SETS ARE DRAWN IS UNCERTAIN. PLEASE NOTIFY THE MICROBIOLOGY DEPARTMENT WITHIN ONE WEEK IF SPECIATION AND SENSITIVITIES ARE REQUIRED. Performed at [facility], [place] [address] [place], [place] [address]    Report Status PENDING  Incomplete  Blood Culture ID Panel (Reflexed)     Status: Abnormal   Collection Time: 05/07/20 10:55 PM  Result Value Ref Range Status   Enterococcus faecalis NOT DETECTED NOT DETECTED Final   Enterococcus Faecium NOT DETECTED NOT DETECTED Final   Listeria monocytogenes NOT DETECTED NOT DETECTED Final   Staphylococcus species DETECTED (A) NOT DETECTED Final    Comment: CRITICAL RESULT CALLED TO, READ BACK BY AND VERIFIED WITH: PHARMD F WILSON 05/08/20 AT 1726 SK    Staphylococcus aureus (BCID) NOT DETECTED NOT DETECTED Final   Staphylococcus epidermidis DETECTED (A) NOT DETECTED Final    Comment: Methicillin (oxacillin) resistant coagulase negative staphylococcus. Possible blood culture contaminant (unless isolated from more than one blood culture draw or clinical case suggests pathogenicity). No antibiotic treatment is indicated for blood  culture contaminants. CRITICAL RESULT CALLED TO, READ BACK BY AND VERIFIED WITH: PHARMD F WILSON 05/08/20 AT 1726 SK    Staphylococcus lugdunensis NOT DETECTED NOT DETECTED Final   Streptococcus species NOT DETECTED NOT  DETECTED Final   Streptococcus agalactiae NOT DETECTED NOT DETECTED Final   Streptococcus pneumoniae NOT DETECTED NOT DETECTED Final   Streptococcus pyogenes NOT DETECTED NOT DETECTED Final   A.calcoaceticus-baumannii NOT DETECTED NOT DETECTED Final   Bacteroides fragilis NOT DETECTED NOT DETECTED Final   Enterobacterales NOT DETECTED NOT DETECTED Final   Enterobacter cloacae complex NOT DETECTED NOT DETECTED Final   Escherichia coli NOT DETECTED NOT DETECTED Final   Klebsiella aerogenes NOT DETECTED NOT DETECTED Final   Klebsiella oxytoca NOT DETECTED NOT DETECTED Final   Klebsiella pneumoniae NOT DETECTED NOT DETECTED Final   Proteus species NOT DETECTED NOT DETECTED Final   Salmonella species NOT DETECTED  NOT DETECTED Final   Serratia marcescens NOT DETECTED NOT DETECTED Final   Haemophilus influenzae NOT DETECTED NOT DETECTED Final   Neisseria meningitidis NOT DETECTED NOT DETECTED Final   Pseudomonas aeruginosa NOT DETECTED NOT DETECTED Final   Stenotrophomonas maltophilia NOT DETECTED NOT DETECTED Final   Candida albicans NOT DETECTED NOT DETECTED Final   Candida auris NOT DETECTED NOT DETECTED Final   Candida glabrata NOT DETECTED NOT DETECTED Final   Candida krusei NOT DETECTED NOT DETECTED Final   Candida parapsilosis NOT DETECTED NOT DETECTED Final   Candida tropicalis NOT DETECTED NOT DETECTED Final   Cryptococcus neoformans/gattii NOT DETECTED NOT DETECTED Final   Methicillin resistance mecA/C DETECTED (A) NOT DETECTED Final    Comment: CRITICAL RESULT CALLED TO, READ BACK BY AND VERIFIED WITH: PHARMD F WILSON 05/08/20 AT 1726 SK Performed at [facility], [place] [address], [place], [place] [address]   Blood culture (routine x 2)     Status: None (Preliminary result)   Collection Time: 05/08/20  3:35 AM   Specimen: BLOOD  Result Value Ref Range Status   Specimen Description BLOOD LEFT ANTECUBITAL  Final   Special Requests   Final    BOTTLES DRAWN AEROBIC ONLY  Blood Culture results may not be optimal due to an inadequate volume of blood received in culture bottles   Culture   Final    NO GROWTH 1 DAY Performed at [facility], [place] [address] [place], [place] [address]    Report Status PENDING  Incomplete  MRSA PCR Screening     Status: None   Collection Time: 05/08/20  4:13 AM   Specimen: Nasal Mucosa; Nasopharyngeal  Result Value Ref Range Status   MRSA by PCR NEGATIVE NEGATIVE Final    Comment:        The GeneXpert MRSA Assay (FDA approved for NASAL specimens only), is one component of a comprehensive MRSA colonization surveillance program. It is not intended to diagnose MRSA infection nor to guide or monitor treatment for MRSA infections. Performed at [facility], [place] [address], [place], [place] [address]     Radiology Reports DG Chest [place] 1 View  Result Date: 05/07/2020 CLINICAL DATA:  Atrial fibrillation and fevers EXAM: PORTABLE CHEST 1 VIEW COMPARISON:  None. FINDINGS: Cardiac shadow is mildly enlarged. Patchy airspace opacities are noted in the left base consistent with acute infiltrate. Pleural thickening is noted bilaterally likely related to a subpleural fat. No effusion or pneumothorax is seen. Degenerative changes of the thoracic spine are noted. IMPRESSION: Patchy left basilar infiltrate. Electronically Signed   By: Inez Catalina M.D.   On: 05/07/2020 23:07   ECHOCARDIOGRAM LIMITED  Result Date: 05/08/2020    ECHOCARDIOGRAM LIMITED REPORT   Patient Name:   HONOR FAIRBANK Date of Exam: 05/08/2020 Medical Rec #:  [id number]     Height:       67.0 in Accession #:    [id number]    Weight:       269.6 lb Date of Birth:  1945-12-06     BSA:          2.296 m Patient Age:    26 years      BP:           121/79 mmHg Patient Gender: M             HR:           75 bpm. Exam Location:  Inpatient Procedure: Limited Echo, Limited  Color Doppler, Cardiac Doppler and Intracardiac            Opacification Agent Indications:    Atrial  Fibrillation 427.31 / I48.91  History:        Patient has no prior history of Echocardiogram examinations.                 Risk Factors:Hypertension and Dyslipidemia. COVID, fevers,                 generalized weakness, cough, and shortness of breath.  Sonographer:    [name] RDCS Referring Phys: [id number] [place]  1. Left ventricular ejection fraction, by estimation, is 60 to 65%. The left ventricle has normal function. The left ventricle has no regional wall motion abnormalities. There is mild concentric left ventricular hypertrophy. Left ventricular diastolic parameters are consistent with Grade II diastolic dysfunction (pseudonormalization).  2. Right ventricular function is not well-visualized, but appears to be mildly reduced. Right ventricular systolic function was not well visualized. The right ventricular size is normal. Tricuspid regurgitation signal is inadequate for assessing PA pressure.  3. The mitral valve is normal in structure. No evidence of mitral valve regurgitation. No evidence of mitral stenosis.  4. The aortic valve was not well visualized. Aortic valve regurgitation is not visualized. No aortic stenosis is present.  5. Aortic dilatation noted. There is mild dilatation at the level of the sinuses of Valsalva, measuring 39 mm.  6. The inferior vena cava is normal in size with greater than 50% respiratory variability, suggesting right atrial pressure of 3 mmHg. FINDINGS  Left Ventricle: Left ventricular ejection fraction, by estimation, is 60 to 65%. The left ventricle has normal function. The left ventricle has no regional wall motion abnormalities. Definity contrast agent was given IV to delineate the left ventricular  endocardial borders. The left ventricular internal cavity size was normal in size. There is mild concentric left ventricular hypertrophy. Left ventricular diastolic parameters are consistent with Grade II diastolic dysfunction (pseudonormalization).  Indeterminate filling pressures. Right Ventricle: Right ventricular function is not well-visualized, but appears to be mildly reduced. The right ventricular size is normal. No increase in right ventricular wall thickness. Right ventricular systolic function was not well visualized. Tricuspid regurgitation signal is inadequate for assessing PA pressure. Left Atrium: Left atrial size was normal in size. Right Atrium: Right atrial size was normal in size. Pericardium: There is no evidence of pericardial effusion. Mitral Valve: The mitral valve is normal in structure. No evidence of mitral valve stenosis. Tricuspid Valve: The tricuspid valve is normal in structure. Tricuspid valve regurgitation is not demonstrated. No evidence of tricuspid stenosis. Aortic Valve: The aortic valve was not well visualized. Aortic valve regurgitation is not visualized. No aortic stenosis is present. Pulmonic Valve: The pulmonic valve was normal in structure. Pulmonic valve regurgitation is not visualized. No evidence of pulmonic stenosis. Aorta: Aortic dilatation noted. There is mild dilatation at the level of the sinuses of Valsalva, measuring 39 mm. Venous: The inferior vena cava is normal in size with greater than 50% respiratory variability, suggesting right atrial pressure of 3 mmHg. IAS/Shunts: No atrial level shunt detected by color flow Doppler. LEFT VENTRICLE PLAX 2D LVIDd:         4.80 cm      Diastology LVIDs:         3.80 cm      LV e' medial:    4.35 cm/s LV PW:         1.30 cm  LV E/e' medial:  12.8 LV IVS:        1.30 cm      LV e' lateral:   4.57 cm/s LVOT diam:     2.10 cm      LV E/e' lateral: 12.2 LV SV:         45 LV SV Index:   20 LVOT Area:     3.46 cm  LV Volumes (MOD) LV vol d, MOD A2C: 105.0 ml LEFT ATRIUM         Index LA diam:    3.40 cm 1.48 cm/m  AORTIC VALVE LVOT Vmax:   67.90 cm/s LVOT Vmean:  42.400 cm/s LVOT VTI:    0.131 m  AORTA Ao Root diam: 3.90 cm Ao Asc diam:  3.60 cm MITRAL VALVE MV Area (PHT):  2.69 cm    SHUNTS MV Decel Time: 282 msec    Systemic VTI:  0.13 m MV E velocity: 55.70 cm/s  Systemic Diam: 2.10 cm MV A velocity: 42.40 cm/s MV E/A ratio:  1.31 Skeet Latch MD Electronically signed by Skeet Latch MD Signature Date/Time: 05/08/2020/10:20:45 AM    Final     Phillips Climes M.D on 05/09/2020 at 3:52 PM    [facility] -  Office  [phone]

## 2020-05-09 NOTE — Progress Notes (Signed)
   05/09/20 1420  Clinical Encounter Type  Visited With Other (Comment) (Chaplain called to speak with patient via phone)  Visit Type Spiritual support  Referral From Nurse  Consult/Referral To Chaplain  Chaplain responded to spiritual consult request for prayer. Chaplain called patient's room after speaking with unit secretary, Rolan Bucco. Patient did not answer. Patient will be added to Spiritual Care dept prayer list. This note was prepared by Jeanine Luz, M.Div..  For questions please contact by phone [phone].

## 2020-05-10 DIAGNOSIS — I10 Essential (primary) hypertension: Secondary | ICD-10-CM

## 2020-05-10 LAB — COMPREHENSIVE METABOLIC PANEL
ALT: 42 U/L (ref 0–44)
AST: 53 U/L — ABNORMAL HIGH (ref 15–41)
Albumin: 2.8 g/dL — ABNORMAL LOW (ref 3.5–5.0)
Alkaline Phosphatase: 51 U/L (ref 38–126)
Anion gap: 8 (ref 5–15)
BUN: 31 mg/dL — ABNORMAL HIGH (ref 8–23)
CO2: 28 mmol/L (ref 22–32)
Calcium: 8.9 mg/dL (ref 8.9–10.3)
Chloride: 100 mmol/L (ref 98–111)
Creatinine, Ser: 1.06 mg/dL (ref 0.61–1.24)
GFR calc non Af Amer: >60 mL/min (ref >60)
Glucose, Bld: 146 mg/dL — ABNORMAL HIGH (ref 70–99)
Potassium: 4.8 mmol/L (ref 3.5–5.1)
Sodium: 136 mmol/L (ref 135–145)
Total Bilirubin: 0.4 mg/dL (ref 0.3–1.2)
Total Protein: 6 g/dL — ABNORMAL LOW (ref 6.5–8.1)

## 2020-05-10 LAB — CBC WITH DIFFERENTIAL/PLATELET
Abs Immature Granulocytes: 0.05 10*3/uL (ref 0.00–0.07)
Basophils Absolute: 0 10*3/uL (ref 0.0–0.1)
Basophils Relative: 0 %
Eosinophils Absolute: 0 10*3/uL (ref 0.0–0.5)
Eosinophils Relative: 0 %
HCT: 53.7 % — ABNORMAL HIGH (ref 39.0–52.0)
Hemoglobin: 17.8 g/dL — ABNORMAL HIGH (ref 13.0–17.0)
Immature Granulocytes: 1 %
Lymphocytes Relative: 13 %
Lymphs Abs: 1.2 10*3/uL (ref 0.7–4.0)
MCH: 33.9 pg (ref 26.0–34.0)
MCHC: 33.1 g/dL (ref 30.0–36.0)
MCV: 102.3 fL — ABNORMAL HIGH (ref 80.0–100.0)
Monocytes Absolute: 0.5 10*3/uL (ref 0.1–1.0)
Monocytes Relative: 6 %
Neutro Abs: 7.4 10*3/uL (ref 1.7–7.7)
Neutrophils Relative %: 80 %
Platelets: 157 10*3/uL (ref 150–400)
RBC: 5.25 MIL/uL (ref 4.22–5.81)
RDW: 12.5 % (ref 11.5–15.5)
WBC: 9.2 10*3/uL (ref 4.0–10.5)
nRBC: 0 % (ref 0.0–0.2)

## 2020-05-10 LAB — C-REACTIVE PROTEIN: CRP: 3.1 mg/dL — ABNORMAL HIGH (ref <1.0)

## 2020-05-10 LAB — CULTURE, BLOOD (ROUTINE X 2): Special Requests: ADEQUATE

## 2020-05-10 LAB — MAGNESIUM: Magnesium: 2 mg/dL (ref 1.7–2.4)

## 2020-05-10 LAB — GLUCOSE, CAPILLARY
Glucose-Capillary: 148 mg/dL — ABNORMAL HIGH (ref 70–99)
Glucose-Capillary: 143 mg/dL — ABNORMAL HIGH (ref 70–99)
Glucose-Capillary: 208 mg/dL — ABNORMAL HIGH (ref 70–99)

## 2020-05-10 LAB — D-DIMER, QUANTITATIVE: D-Dimer, Quant: 0.64 ug/mL-FEU — ABNORMAL HIGH (ref 0.00–0.50)

## 2020-05-10 MED ORDER — METOPROLOL TARTRATE 25 MG PO TABS: 25.0000 mg | ORAL_TABLET | Freq: Two times a day (BID) | ORAL | 0 refills | Status: DC

## 2020-05-10 MED ORDER — DEXAMETHASONE 6 MG PO TABS: 6.0000 mg | ORAL_TABLET | Freq: Every day | ORAL | 0 refills | Status: DC

## 2020-05-10 MED ORDER — PANTOPRAZOLE SODIUM 40 MG PO TBEC: 40.0000 mg | DELAYED_RELEASE_TABLET | Freq: Every day | ORAL | 0 refills | Status: DC

## 2020-05-10 NOTE — Progress Notes (Signed)
Patient was discharged home by MD order; discharge instructions reviewed and given to patient with care notes; IV DIC; skin intact; oxygen tank, oxygen concentrator, and walker were delivered to patient room; patient will be escorted to the car by nurse tech via wheelchair.

## 2020-05-10 NOTE — Progress Notes (Addendum)
SATURATION QUALIFICATIONS: (This note is used to comply with regulatory documentation for home oxygen)  Patient Saturations on Room Air at Rest = 92%  Patient Saturations on Room Air while Ambulating = 84%  Patient Saturations on 2 Liters of oxygen while Ambulating = 92%  Please briefly explain why patient needs home oxygen: 

## 2020-05-10 NOTE — Care Management Important Message (Signed)
Important Message  Patient Details  Name: Shane Diaz MRN: [id number] Date of Birth: 12/02/1945   Medicare Important Message Given:  Yes - Important Message mailed due to current National Emergency  Verbal consent obtained due to current National Emergency  Relationship to patient: Child Contact Name: [name] Call Date: 05/10/20  Time: 1336 Phone: [phone] Outcome: No Answer/Busy Important Message mailed to: Patient address on file    Delorse Lek 05/10/2020, 1:37 PM

## 2020-05-10 NOTE — Discharge Summary (Signed)
Shane Diaz, is a 74 y.o. male  DOB 1945-11-29  MRN [id number].  Admission date:  05/07/2020  Admitting Physician  Shela Leff, MD  Discharge Date:  05/10/2020   Primary MD  [name], MD  Recommendations for primary care physician for things to follow:  -Please check CBC, CMP during next visit.   Admission Diagnosis  Paroxysmal atrial fibrillation (HCC) [I48.0] Pneumonia due to COVID-19 virus [U07.1, J12.82] COVID-19 [U07.1]   Discharge Diagnosis  Paroxysmal atrial fibrillation (HCC) [I48.0] Pneumonia due to COVID-19 virus [U07.1, J12.82] COVID-19 [U07.1]    Principal Problem:   Pneumonia due to COVID-19 virus Active Problems:   Sepsis ([place])   Atrial fibrillation with rapid ventricular response (HCC)   Elevated troponin   Essential hypertension      Past Medical History:  Diagnosis Date  . Arthritis   . Diverticulosis 05/2015   Mild, noted on colonosocpy  . Fatty liver 05/14/2015   noted on [place] ABD  . History of colon polyps    sessile in ascending colon, pedunculated in sigmoid colon  . Hyperlipidemia    pt denies  . Hypertension    not currently taking medications per MD  . 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 Umbilical hernia   . Varicose veins     Past Surgical History:  Procedure Laterality Date  . APPENDECTOMY     childhood  . COLONOSCOPY W/ POLYPECTOMY  05/23/2014  . UMBILICAL HERNIA REPAIR N/A 04/27/2018   Procedure: REPAIR UMBILICAL HERNIA ERAS PATHWAY;  Surgeon: Alphonsa Overall, MD;  Location: [facility];  Service: General;  Laterality: N/A;  . varicose veins    . WRIST SURGERY         History of present illness and  Hospital Course:     Kindly see H&P for history of present illness and admission details, please review complete Labs, Consult reports and Test reports for all details in brief  HPI  from the history and physical done on the  day of admission 05/07/2020  HPI: Shane Diaz is a 74 y.o. 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  Upon EMS arrival, he was found to be in A. fib with rate in the 180s.  He was given a Cardizem bolus followed by drip and 600 cc IV fluid.    Patient states he is feeling unwell for the past 3 to 4 days.  He is having fevers, generalized weakness, cough, and shortness of breath.  Today he felt very dizzy and had to call EMS.  He was vaccinated against Covid back in March.  Denies chest pain or palpitations.  Denies nausea, vomiting, abdominal pain, or diarrhea.   ED Course: Temperature 99.9 F.  A. fib with heart rate 100-120 on arrival to the ED.  Tachypneic with respiratory rate in the 30s.  Oxygen saturation 90-93% at rest but intermittently dropping down to mid 80s.  SARS-CoV-2 PCR test positive.  Influenza panel negative.  WBC 5.8. Hemoglobin 17.8 and hematocrit 53.4 (at baseline).  Platelet count 122K.  Sodium 132, potassium 4.3, chloride 97, bicarb 23, BUN 17, creatinine 1.0, glucose 119.  AST 139, ALT 60, alk phos 60, T bili 1.1.  Lactic acid 1.4.  High sensitive troponin 116.  EKG with new RBBB.  BNP 273.  Blood culture x2 pending.  UA and urine culture pending.  Chest x-ray showing patchy left basilar infiltrate. Patient was given Tylenol, IV Decadron 10 mg, and remdesivir.    Hospital Course    Acute hypoxic respiratory failure due to COVID-19 pneumonia, sepsis ruled out -He is vaccinated -Hypoxia significantly improved, he is requiring 2 L nasal cannula with activity, home oxygen has been arranged on discharge, he was treated with remdesivir and steroids during hospital stay, he will be discharged to finish another 2 days of IV remdesivir as an outpatient, and  another 6 days of oral Decadron, he was encouraged to take his incentive spirometry and flutter valve and keep using them.  Paroxysmal A. Fib -Already anticoagulated with Eliquis, he was initially on diltiazem infusion, converted to sinus rhythm and he was placed on diltiazem p.o. I have switched him to p.o. metoprolol given he had 8 beats of NSVT.  NSVT - patient had 8 beats of nonsustained V. tach, 2D echo with a preserved EF, potassium was appropriate, magnesium was 1.7 which has been repleted, he was started on beta-blockers with no recurrence of NSVT.   Essential hypertension -Cardizem switched to metoprolol  Hyperlipidemia -Not on statin, hold given mildly elevated LFTs, lipid panel shows hypercholesterolemia. Maybe he would benefit from a statin as outpatient.  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  Elevated troponin -Flat, not in a pattern consistent with ACS, no anginal type symptoms, likely due to A. fib with RVR.    Discharge Condition:  Stable   Follow UP   Follow-up Information    [name], MD Follow up in 1 week(s).   Specialty: Internal Medicine Contact information: [id number] [place] [address] [phone]                 Discharge Instructions  and  Discharge Medications   *  Discharge Instructions    Diet - low sodium heart healthy   Complete by: As directed    Discharge instructions   Complete by: As directed    Follow with Primary MD [name], MD in 7 days   Get CBC, CMP,  checked  by Primary MD next visit.    Activity: As tolerated with Full fall precautions use walker/cane & assistance as  needed   Disposition Home    Diet: Heart Healthy , with feeding assistance and aspiration precautions.  For Heart failure patients - Check your Weight same time everyday, if you gain over 2 pounds, or you develop in leg swelling, experience more shortness of breath or chest pain, call your Primary MD immediately. Follow Cardiac Low Salt Diet and 1.5 lit/day fluid restriction.   On your next visit with your primary care physician please Get Medicines reviewed and adjusted.   Please  request your Prim.MD to go over all Hospital Tests and Procedure/Radiological results at the follow up, please get all Hospital records sent to your Prim MD by signing hospital release before you go home.   If you experience worsening of your admission symptoms, develop shortness of breath, life threatening emergency, suicidal or homicidal thoughts you must seek medical attention immediately by calling 911 or calling your MD immediately  if symptoms less severe.  You Must read complete instructions/literature along with all the possible adverse reactions/side effects for all the Medicines you take and that have been prescribed to you. Take any new Medicines after you have completely understood and accpet all the possible adverse reactions/side effects.   Do not drive, operating heavy machinery, perform activities at heights, swimming or participation in water activities or provide baby sitting services if your were admitted for syncope or siezures until you have seen by Primary MD or a Neurologist and advised to do so again.  Do not drive when taking Pain medications.    Do not take more than prescribed Pain, Sleep and Anxiety Medications  Special Instructions: If you have smoked or chewed Tobacco  in the last 2 yrs please stop smoking, stop any regular Alcohol  and or any Recreational drug use.  Wear Seat belts while driving.   Please note  You were cared for by a hospitalist during your hospital stay.  If you have any questions about your discharge medications or the care you received while you were in the hospital after you are discharged, you can call the unit and asked to speak with the hospitalist on call if the hospitalist that took care of you is not available. Once you are discharged, your primary care physician will handle any further medical issues. Please note that NO REFILLS for any discharge medications will be authorized once you are discharged, as it is imperative that you return to your primary care physician (or establish a relationship with a primary care physician if you do not have one) for your aftercare needs so that they can reassess your need for medications and monitor your lab values.   Increase activity slowly   Complete by: As directed      Allergies as of 05/10/2020   No Known Allergies     Medication List    TAKE these medications   apixaban 5 MG Tabs tablet Commonly known as: Eliquis Take 1 tablet (5 mg total) by mouth 2 (two) times daily.   dexamethasone 6 MG tablet Commonly known as: DECADRON Take 1 tablet (6 mg total) by mouth daily.   gatifloxacin 0.5 % Soln Commonly known as: ZYMAXID Place 1 drop into the left eye 4 (four) times daily.   metoprolol tartrate 25 MG tablet Commonly known as: LOPRESSOR Take 1 tablet (25 mg total) by mouth 2 (two) times daily.   pantoprazole 40 MG tablet Commonly known as: Protonix Take 1 tablet (40 mg total) by mouth daily.   Prolensa 0.07 % Soln Generic drug: Bromfenac Sodium Place 1 drop into the right eye daily.            Durable Medical Equipment  (From admission, onward)         Start     Ordered   05/09/20 1559  For home use only DME oxygen  Once       Question Answer Comment  Length of Need 6 Months   Mode or (Route) Nasal cannula   Liters per Minute 2   Frequency  Continuous (stationary and portable oxygen unit needed)   Oxygen conserving device Yes   Oxygen delivery system Gas       05/09/20 1559            Diet and Activity recommendation: See Discharge Instructions above   Consults obtained - none   Major procedures and Radiology Reports - PLEASE review detailed and final reports for all details, in brief -      DG Chest Port 1 View  Result Date: 05/07/2020 CLINICAL DATA:  Atrial fibrillation and fevers EXAM: PORTABLE CHEST 1 VIEW COMPARISON:  None. FINDINGS: Cardiac shadow is mildly enlarged. Patchy airspace opacities are noted in the left base consistent with acute infiltrate. Pleural thickening is noted bilaterally likely related to a subpleural fat. No effusion or pneumothorax is seen. Degenerative changes of the thoracic spine are noted. IMPRESSION: Patchy left basilar infiltrate. Electronically Signed   By: Inez Catalina M.D.   On: 05/07/2020 23:07   ECHOCARDIOGRAM LIMITED  Result Date: 05/08/2020    ECHOCARDIOGRAM LIMITED REPORT   Patient Name:   PRANAY HILBUN Date of Exam: 05/08/2020 Medical Rec #:  [id number]     Height:       67.0 in Accession #:    [id number]    Weight:       269.6 lb Date of Birth:  [birth year]     BSA:          2.296 m Patient Age:    46 years      BP:           121/79 mmHg Patient Gender: M             HR:           75 bpm. Exam Location:  Inpatient Procedure: Limited Echo, Limited Color Doppler, Cardiac Doppler and Intracardiac            Opacification Agent Indications:    Atrial Fibrillation 427.31 / I48.91  History:        Patient has no prior history of Echocardiogram examinations.                 Risk Factors:Hypertension and Dyslipidemia. COVID, fevers,                 generalized weakness, cough, and shortness of breath.  Sonographer:    [name] RDCS Referring Phys: [id number] [place]  1. Left ventricular ejection fraction, by estimation, is 60 to 65%. The left ventricle has normal function. The left ventricle has no regional wall motion abnormalities. There is mild concentric left ventricular hypertrophy.  Left ventricular diastolic parameters are consistent with Grade II diastolic dysfunction (pseudonormalization).  2. Right ventricular function is not well-visualized, but appears to be mildly reduced. Right ventricular systolic function was not well visualized. The right ventricular size is normal. Tricuspid regurgitation signal is inadequate for assessing PA pressure.  3. The mitral valve is normal in structure. No evidence of mitral valve regurgitation. No evidence of mitral stenosis.  4. The aortic valve was not well visualized. Aortic valve regurgitation is not visualized. No aortic stenosis is present.  5. Aortic dilatation noted. There is mild dilatation at the level of the sinuses of Valsalva, measuring 39 mm.  6. The inferior vena cava is normal in size with greater than 50% respiratory variability, suggesting right atrial pressure of 3 mmHg. FINDINGS  Left Ventricle: Left ventricular ejection fraction, by estimation, is 60 to 65%. The left ventricle has normal function. The  left ventricle has no regional wall motion abnormalities. Definity contrast agent was given IV to delineate the left ventricular  endocardial borders. The left ventricular internal cavity size was normal in size. There is mild concentric left ventricular hypertrophy. Left ventricular diastolic parameters are consistent with Grade II diastolic dysfunction (pseudonormalization). Indeterminate filling pressures. Right Ventricle: Right ventricular function is not well-visualized, but appears to be mildly reduced. The right ventricular size is normal. No increase in right ventricular wall thickness. Right ventricular systolic function was not well visualized. Tricuspid regurgitation signal is inadequate for assessing PA pressure. Left Atrium: Left atrial size was normal in size. Right Atrium: Right atrial size was normal in size. Pericardium: There is no evidence of pericardial effusion. Mitral Valve: The mitral valve is normal in structure.  No evidence of mitral valve stenosis. Tricuspid Valve: The tricuspid valve is normal in structure. Tricuspid valve regurgitation is not demonstrated. No evidence of tricuspid stenosis. Aortic Valve: The aortic valve was not well visualized. Aortic valve regurgitation is not visualized. No aortic stenosis is present. Pulmonic Valve: The pulmonic valve was normal in structure. Pulmonic valve regurgitation is not visualized. No evidence of pulmonic stenosis. Aorta: Aortic dilatation noted. There is mild dilatation at the level of the sinuses of Valsalva, measuring 39 mm. Venous: The inferior vena cava is normal in size with greater than 50% respiratory variability, suggesting right atrial pressure of 3 mmHg. IAS/Shunts: No atrial level shunt detected by color flow Doppler. LEFT VENTRICLE PLAX 2D LVIDd:         4.80 cm      Diastology LVIDs:         3.80 cm      LV e' medial:    4.35 cm/s LV PW:         1.30 cm      LV E/e' medial:  12.8 LV IVS:        1.30 cm      LV e' lateral:   4.57 cm/s LVOT diam:     2.10 cm      LV E/e' lateral: 12.2 LV SV:         45 LV SV Index:   20 LVOT Area:     3.46 cm  LV Volumes (MOD) LV vol d, MOD A2C: 105.0 ml LEFT ATRIUM         Index LA diam:    3.40 cm 1.48 cm/m  AORTIC VALVE LVOT Vmax:   67.90 cm/s LVOT Vmean:  42.400 cm/s LVOT VTI:    0.131 m  AORTA Ao Root diam: 3.90 cm Ao Asc diam:  3.60 cm MITRAL VALVE MV Area (PHT): 2.69 cm    SHUNTS MV Decel Time: 282 msec    Systemic VTI:  0.13 m MV E velocity: 55.70 cm/s  Systemic Diam: 2.10 cm MV A velocity: 42.40 cm/s MV E/A ratio:  1.31 Skeet Latch MD Electronically signed by Skeet Latch MD Signature Date/Time: 05/08/2020/10:20:45 AM    Final     Micro Results     Recent Results (from the past 240 hour(s))  Respiratory Panel by RT PCR (Flu A&B, Covid) - Nasopharyngeal Swab     Status: Abnormal   Collection Time: 05/07/20 10:30 PM   Specimen: Nasopharyngeal Swab  Result Value Ref Range Status   SARS Coronavirus 2 by  RT PCR POSITIVE (A) NEGATIVE Final    Comment: RESULT CALLED TO, READ BACK BY AND VERIFIED WITH: Redmond School RN 05/08/20 AT 0030 SK (NOTE) SARS-CoV-2 target nucleic acids  are DETECTED.  SARS-CoV-2 RNA is generally detectable in upper respiratory specimens  during the acute phase of infection. Positive results are indicative of the presence of the identified virus, but do not rule out bacterial infection or co-infection with other pathogens not detected by the test. Clinical correlation with patient history and other diagnostic information is necessary to determine patient infection status. The expected result is Negative.  Fact Sheet for Patients:  PinkCheek.be  Fact Sheet for Healthcare Providers: GravelBags.it  This test is not yet approved or cleared by the [place] FDA and  has been authorized for detection and/or diagnosis of SARS-CoV-2 by FDA under an Emergency Use Authorization (EUA).  This EUA will remain in effect (meaning this test can be [place] ed) for the duration of  the COVID-19 declaration under Section 564(b)(1) of the Act, 21 U.S.C. section 360bbb-3(b)(1), unless the authorization is terminated or revoked sooner.      Influenza A by PCR NEGATIVE NEGATIVE Final   Influenza B by PCR NEGATIVE NEGATIVE Final    Comment: (NOTE) The Xpert Xpress SARS-CoV-2/FLU/RSV assay is intended as an aid in  the diagnosis of influenza from Nasopharyngeal swab specimens and  should not be used as a sole basis for treatment. Nasal washings and  aspirates are unacceptable for Xpert Xpress SARS-CoV-2/FLU/RSV  testing.  Fact Sheet for Patients: PinkCheek.be  Fact Sheet for Healthcare Providers: GravelBags.it  This test is not yet approved or cleared by the [place] FDA and  has been authorized for detection and/or diagnosis of SARS-CoV-2 by  FDA under an  Emergency Use Authorization (EUA). This EUA will remain  in effect (meaning this test can be used) for the duration of the  Covid-19 declaration under Section 564(b)(1) of the Act, 21  U.S.C. section 360bbb-3(b)(1), unless the authorization is  terminated or revoked. Performed at [facility], [place] [address], [place], [place] [address]   Blood culture (routine x 2)     Status: Abnormal   Collection Time: 05/07/20 10:55 PM   Specimen: BLOOD  Result Value Ref Range Status   Specimen Description BLOOD RIGHT ANTECUBITAL  Final   Special Requests   Final    BOTTLES DRAWN AEROBIC AND ANAEROBIC Blood Culture adequate volume   Culture  Setup Time   Final    GRAM POSITIVE COCCI IN BOTH AEROBIC AND ANAEROBIC BOTTLES CRITICAL RESULT CALLED TO, READ BACK BY AND VERIFIED WITH: PHARMD F WILSON 05/08/20 AT 34 SK    Culture (A)  Final    STAPHYLOCOCCUS EPIDERMIDIS THE SIGNIFICANCE OF ISOLATING THIS ORGANISM FROM A SINGLE SET OF BLOOD CULTURES WHEN MULTIPLE SETS ARE DRAWN IS UNCERTAIN. PLEASE NOTIFY THE MICROBIOLOGY DEPARTMENT WITHIN ONE WEEK IF SPECIATION AND SENSITIVITIES ARE REQUIRED. Performed at [facility], [place] [address], [place], [place] [address]    Report Status 05/10/2020 FINAL  Final  Blood Culture ID Panel (Reflexed)     Status: Abnormal   Collection Time: 05/07/20 10:55 PM  Result Value Ref Range Status   Enterococcus faecalis NOT DETECTED NOT DETECTED Final   Enterococcus Faecium NOT DETECTED NOT DETECTED Final   Listeria monocytogenes NOT DETECTED NOT DETECTED Final   Staphylococcus species DETECTED (A) NOT DETECTED Final    Comment: CRITICAL RESULT CALLED TO, READ BACK BY AND VERIFIED WITH: PHARMD F WILSON 05/08/20 AT 1726 SK    Staphylococcus aureus (BCID) NOT DETECTED NOT DETECTED Final   Staphylococcus epidermidis DETECTED (A) NOT DETECTED Final    Comment: Methicillin (oxacillin) resistant  coagulase negative staphylococcus. Possible blood culture contaminant  (unless isolated from more than one blood culture draw or clinical case suggests pathogenicity). No antibiotic treatment is indicated for blood  culture contaminants. CRITICAL RESULT CALLED TO, READ BACK BY AND VERIFIED WITH: PHARMD F WILSON 05/08/20 AT 1726 SK    Staphylococcus lugdunensis NOT DETECTED NOT DETECTED Final   Streptococcus species NOT DETECTED NOT DETECTED Final   Streptococcus agalactiae NOT DETECTED NOT DETECTED Final   Streptococcus pneumoniae NOT DETECTED NOT DETECTED Final   Streptococcus pyogenes NOT DETECTED NOT DETECTED Final   A.calcoaceticus-baumannii NOT DETECTED NOT DETECTED Final   Bacteroides fragilis NOT DETECTED NOT DETECTED Final   Enterobacterales NOT DETECTED NOT DETECTED Final   Enterobacter cloacae complex NOT DETECTED NOT DETECTED Final   Escherichia coli NOT DETECTED NOT DETECTED Final   Klebsiella aerogenes NOT DETECTED NOT DETECTED Final   Klebsiella oxytoca NOT DETECTED NOT DETECTED Final   Klebsiella pneumoniae NOT DETECTED NOT DETECTED Final   Proteus species NOT DETECTED NOT DETECTED Final   Salmonella species NOT DETECTED NOT DETECTED Final   Serratia marcescens NOT DETECTED NOT DETECTED Final   Haemophilus influenzae NOT DETECTED NOT DETECTED Final   Neisseria meningitidis NOT DETECTED NOT DETECTED Final   Pseudomonas aeruginosa NOT DETECTED NOT DETECTED Final   Stenotrophomonas maltophilia NOT DETECTED NOT DETECTED Final   Candida albicans NOT DETECTED NOT DETECTED Final   Candida auris NOT DETECTED NOT DETECTED Final   Candida glabrata NOT DETECTED NOT DETECTED Final   Candida krusei NOT DETECTED NOT DETECTED Final   Candida parapsilosis NOT DETECTED NOT DETECTED Final   Candida tropicalis NOT DETECTED NOT DETECTED Final   Cryptococcus neoformans/gattii NOT DETECTED NOT DETECTED Final   Methicillin resistance mecA/C DETECTED (A) NOT DETECTED Final    Comment: CRITICAL RESULT CALLED TO, READ BACK BY AND VERIFIED WITH: PHARMD F WILSON  05/08/20 AT 1726 SK Performed at [facility], [address], [place], [place] [address]   Blood culture (routine x 2)     Status: None (Preliminary result)   Collection Time: 05/08/20  3:35 AM   Specimen: BLOOD  Result Value Ref Range Status   Specimen Description BLOOD LEFT ANTECUBITAL  Final   Special Requests   Final    BOTTLES DRAWN AEROBIC ONLY Blood Culture results may not be optimal due to an inadequate volume of blood received in culture bottles   Culture   Final    NO GROWTH 1 DAY Performed at [facility], [place] [address] [place], [place] [address]    Report Status PENDING  Incomplete  MRSA PCR Screening     Status: None   Collection Time: 05/08/20  4:13 AM   Specimen: Nasal Mucosa; Nasopharyngeal  Result Value Ref Range Status   MRSA by PCR NEGATIVE NEGATIVE Final    Comment:        The GeneXpert MRSA Assay (FDA approved for NASAL specimens only), is one component of a comprehensive MRSA colonization surveillance program. It is not intended to diagnose MRSA infection nor to guide or monitor treatment for MRSA infections. Performed at [facility], [place] [address], [place], [place] [address]        Today   Subjective:   Silvano Garofano today has no headache,no chest abdominal pain,no new weakness tingling or numbness, feels much better wants to go home today.   Objective:   Blood pressure (!) 146/88, pulse 63, temperature 98.1 F (36.7 C), temperature source Oral, resp. rate  15, height _0  (1.702 m), weight 122.3 kg, SpO2 96 %.   Intake/Output Summary (Last 24 hours) at 05/10/2020 1121 Last data filed at 05/10/2020 0803 Gross per 24 hour  Intake 720 ml  Output --  Net 720 ml    Exam Awake Alert, Oriented x 3, No new F.N deficits, Normal affect [place].AT,PERRAL Supple Neck,No JVD, No cervical lymphadenopathy appriciated.  Symmetrical Chest wall movement, Good air movement bilaterally, CTAB RRR,No Gallops,Rubs or new Murmurs, No  Parasternal Heave +ve B.Sounds, Abd Soft, Non tender, No organomegaly appriciated, No rebound -guarding or rigidity. No Cyanosis, Clubbing or edema, No new Rash or bruise  Data Review   CBC w Diff:  Lab Results  Component Value Date   WBC 9.2 05/10/2020   HGB 17.8 (H) 05/10/2020   HCT 53.7 (H) 05/10/2020   PLT 157 05/10/2020   LYMPHOPCT 13 05/10/2020   MONOPCT 6 05/10/2020   EOSPCT 0 05/10/2020   BASOPCT 0 05/10/2020    CMP:  Lab Results  Component Value Date   NA 136 05/10/2020   K 4.8 05/10/2020   CL 100 05/10/2020   CO2 28 05/10/2020   BUN 31 (H) 05/10/2020   CREATININE 1.06 05/10/2020   PROT 6.0 (L) 05/10/2020   ALBUMIN 2.8 (L) 05/10/2020   BILITOT 0.4 05/10/2020   ALKPHOS 51 05/10/2020   AST 53 (H) 05/10/2020   ALT 42 05/10/2020  .   Total Time in preparing paper work, data evaluation and todays exam - 25 minutes  Phillips Climes M.D on 05/10/2020 at 11:21 AM  [facility]   Office  [phone]

## 2020-05-10 NOTE — Progress Notes (Signed)
Patient scheduled for outpatient Remdesivir infusions at 9am on Friday 10/8 and Saturday 10/9 at [facility]. Please inform the patient to park at [place], as staff will be escorting the patient through the [place] entrance of the hospital. Appointments take approximately 45 minutes.    There is a wave flag banner located near the entrance on [address]. Turn into this entrance and immediately turn left or right and park in 1 of the 10 designated Covid Infusion Parking spots. There is a phone number on the sign, please call and let the staff know what spot you are in and we will come out and get you. For questions call [phone].  Thanks.

## 2020-05-10 NOTE — Plan of Care (Signed)

## 2020-05-10 NOTE — TOC Initial Note (Signed)
Transition of Care [facility]) - Initial/Assessment Note    Patient Details  Name: Shane Diaz MRN: [id number] Date of Birth: 1946/07/27  [facility] CM/SW Contact:    Hyman Hopes, RN Phone Number: 05/10/2020, 10:11 AM  Clinical Narrative:                 Case manager noted need for Rolling walker and oxygen.  Called patient on phone due to [name]  Prior to admission patient was independent of all ADLs.  Primary care physician Shane Diaz. Verified address.  Lives in single family home with wife.  Discussed Medicare.gov information on equipment needs and patient does not have a preference on suppliers and gave permission for CM to choose.  Called [employer], [place], and set up for home oxygen and rolling walker to be delivered to room.  TOC following for progression of care.     Expected Discharge Plan: Home/Self Care Barriers to Discharge: No Barriers Identified   Patient Goals and CMS Choice Patient states their goals for this hospitalization and ongoing recovery are:: to go home CMS Medicare.gov Compare Post Acute Care list provided to:: Patient Choice offered to / list presented to : Patient  Expected Discharge Plan and Services Expected Discharge Plan: Home/Self Care   Discharge Planning Services: CM Consult Post Acute Care Choice: Durable Medical Equipment Living arrangements for the past 2 months: Single Family Home                 DME Arranged: Oxygen, Walker rolling DME Agency: [employer] Date DME Agency Contacted: 05/10/20 Time DME Agency Contacted: 52 Representative spoke with at DME Agency: Thedore Mins            Prior Living Arrangements/Services Living arrangements for the past 2 months: [place] with:: Spouse Patient language and need for interpreter reviewed:: Yes Do you feel safe going back to the place where you live?: Yes      Need for Family Participation in Patient Care: Yes (Comment) Care giver support system in place?: Yes  (comment)   Criminal Activity/Legal Involvement Pertinent to Current Situation/Hospitalization: No - Comment as needed  Activities of Daily Living Home Assistive Devices/Equipment: None ADL Screening (condition at time of admission) Patient's cognitive ability adequate to safely complete daily activities?: Yes Is the patient deaf or have difficulty hearing?: No Does the patient have difficulty seeing, even when wearing glasses/contacts?: No Does the patient have difficulty concentrating, remembering, or making decisions?: No Patient able to express need for assistance with ADLs?: Yes Does the patient have difficulty dressing or bathing?: No Independently performs ADLs?: Yes (appropriate for developmental age) Does the patient have difficulty walking or climbing stairs?: Yes Weakness of Legs: Both Weakness of Arms/Hands: None  Permission Sought/Granted Permission sought to share information with : Case Manager Permission granted to share information with : Yes, Verbal Permission Granted     Permission granted to share info w AGENCY: [employer]        Emotional Assessment   Attitude/Demeanor/Rapport: Engaged Affect (typically observed): Appropriate Orientation: : Oriented to Self, Oriented to Place, Oriented to  Time, Oriented to Situation Alcohol / Substance Use: Not Applicable Psych Involvement: No (comment)  Admission diagnosis:  Paroxysmal atrial fibrillation ([place]) [I48.0] Pneumonia due to COVID-19 virus [U07.1, J12.82] COVID-19 [U07.1] Patient Active Problem List   Diagnosis Date Noted  . Pneumonia due to COVID-19 virus 05/08/2020  . Sepsis ([place]) 05/08/2020  . Atrial fibrillation with rapid ventricular response ([place]) 05/08/2020  . Elevated troponin  05/08/2020  . Essential hypertension 05/08/2020  . Varicose veins of leg with complications 93/79/0240  . Varicose veins of left lower extremity with complications 97/35/3299  . Varicose veins of bilateral lower extremities with  other complications 24/26/8341  . Special screening for malignant neoplasms, colon 04/11/2014   PCP:  Shane Panda, MD Pharmacy:   [facility] [address], [place] [facility] [address] [place] [address] Phone: [phone] Fax: [phone]     Social Determinants of Health (SDOH) Interventions    Readmission Risk Interventions No flowsheet data found.

## 2020-05-11 ENCOUNTER — Ambulatory Visit (HOSPITAL_COMMUNITY)
Admit: 2020-05-11 | Discharge: 2020-05-11 | Disposition: A | Discharge status: Home / Self Care | Payer: Medicare Other | Source: Ambulatory Visit | Attending: Pulmonary Disease | Admitting: Pulmonary Disease

## 2020-05-11 DIAGNOSIS — U071 COVID-19: Secondary | ICD-10-CM | POA: Insufficient documentation

## 2020-05-11 DIAGNOSIS — J1282 Pneumonia due to coronavirus disease 2019: Secondary | ICD-10-CM | POA: Diagnosis not present

## 2020-05-11 MED ORDER — EPINEPHRINE 0.3 MG/0.3ML IJ SOAJ: 0.3000 mg | Freq: Once | INTRAMUSCULAR | Status: DC | PRN

## 2020-05-11 MED ORDER — SODIUM CHLORIDE 0.9 % IV SOLN
100.0000 mg | Freq: Once | INTRAVENOUS | Status: AC
  Administered 2020-05-11: 100 mg via INTRAVENOUS

## 2020-05-11 MED ORDER — FAMOTIDINE IN NACL 20-0.9 MG/50ML-% IV SOLN: 20.0000 mg | Freq: Once | INTRAVENOUS | Status: DC | PRN

## 2020-05-11 MED ORDER — METHYLPREDNISOLONE SODIUM SUCC 125 MG IJ SOLR: 125.0000 mg | Freq: Once | INTRAMUSCULAR | Status: DC | PRN

## 2020-05-11 MED ORDER — ALBUTEROL SULFATE HFA 108 (90 BASE) MCG/ACT IN AERS: 2.0000 | INHALATION_SPRAY | Freq: Once | RESPIRATORY_TRACT | Status: DC | PRN

## 2020-05-11 MED ORDER — SODIUM CHLORIDE 0.9 % IV SOLN: INTRAVENOUS | Status: DC | PRN

## 2020-05-11 MED ORDER — DIPHENHYDRAMINE HCL 50 MG/ML IJ SOLN: 50.0000 mg | Freq: Once | INTRAMUSCULAR | Status: DC | PRN

## 2020-05-11 NOTE — Discharge Instructions (Signed)
10 Things You Can Do to Manage Your COVID-19 Symptoms at Home If you have possible or confirmed COVID-19: 1. Stay home from work and school. And stay away from other public places. If you must go out, avoid using any kind of public transportation, ridesharing, or taxis. 2. Monitor your symptoms carefully. If your symptoms get worse, call your healthcare provider immediately. 3. Get rest and stay hydrated. 4. If you have a medical appointment, call the healthcare provider ahead of time and tell them that you have or may have COVID-19. 5. For medical emergencies, call 911 and notify the dispatch personnel that you have or may have COVID-19. 6. Cover your cough and sneezes with a tissue or use the inside of your elbow. 7. Wash your hands often with soap and water for at least 20 seconds or clean your hands with an alcohol-based hand sanitizer that contains at least 60% alcohol. 8. As much as possible, stay in a specific room and away from other people in your home. Also, you should use a separate bathroom, if available. If you need to be around other people in or outside of the home, wear a mask. 9. Avoid sharing personal items with other people in your household, like dishes, towels, and bedding. 10. Clean all surfaces that are touched often, like counters, tabletops, and doorknobs. Use household cleaning sprays or wipes according to the label instructions. cdc.gov/coronavirus 02/02/2019 This information is not intended to replace advice given to you by your health care provider. Make sure you discuss any questions you have with your health care provider. Document Revised: 07/07/2019 Document Reviewed: 07/07/2019 Elsevier Patient Education  2020 Elsevier Inc.  

## 2020-05-11 NOTE — Progress Notes (Signed)
  Diagnosis: COVID-19  Physician: Dr. Asencion Noble  Procedure: Covid Infusion Clinic Med: remdesivir infusion - Provided patient with remdesivir fact sheet for patients, parents and caregivers prior to infusion.  Complications: No immediate complications noted.  Discharge: Discharged home   Shane Diaz 05/11/2020

## 2020-05-12 ENCOUNTER — Ambulatory Visit (HOSPITAL_COMMUNITY)
Admit: 2020-05-12 | Discharge: 2020-05-12 | Disposition: A | Discharge status: Home / Self Care | Payer: Medicare Other | Attending: Pulmonary Disease | Admitting: Pulmonary Disease

## 2020-05-12 DIAGNOSIS — U071 COVID-19: Secondary | ICD-10-CM | POA: Insufficient documentation

## 2020-05-12 MED ORDER — FAMOTIDINE IN NACL 20-0.9 MG/50ML-% IV SOLN: 20.0000 mg | Freq: Once | INTRAVENOUS | Status: DC | PRN

## 2020-05-12 MED ORDER — ALBUTEROL SULFATE HFA 108 (90 BASE) MCG/ACT IN AERS: 2.0000 | INHALATION_SPRAY | Freq: Once | RESPIRATORY_TRACT | Status: DC | PRN

## 2020-05-12 MED ORDER — SODIUM CHLORIDE 0.9 % IV SOLN
100.0000 mg | Freq: Once | INTRAVENOUS | Status: AC
  Administered 2020-05-12: 100 mg via INTRAVENOUS

## 2020-05-12 MED ORDER — DIPHENHYDRAMINE HCL 50 MG/ML IJ SOLN: 50.0000 mg | Freq: Once | INTRAMUSCULAR | Status: DC | PRN

## 2020-05-12 MED ORDER — EPINEPHRINE 0.3 MG/0.3ML IJ SOAJ: 0.3000 mg | Freq: Once | INTRAMUSCULAR | Status: DC | PRN

## 2020-05-12 MED ORDER — SODIUM CHLORIDE 0.9 % IV SOLN: INTRAVENOUS | Status: DC | PRN

## 2020-05-12 MED ORDER — METHYLPREDNISOLONE SODIUM SUCC 125 MG IJ SOLR: 125.0000 mg | Freq: Once | INTRAMUSCULAR | Status: DC | PRN

## 2020-05-12 NOTE — Progress Notes (Signed)
  Diagnosis: COVID-19  Physician: Dr. Joya Gaskins   Procedure: Covid Infusion Clinic Med: remdesivir infusion - Provided patient with remdesivir fact sheet for patients, parents and caregivers prior to infusion.  Complications: No immediate complications noted.  Discharge: Discharged home   Shane Diaz 05/12/2020

## 2020-05-13 LAB — CULTURE, BLOOD (ROUTINE X 2): Culture: NO GROWTH

## 2020-07-07 ENCOUNTER — Emergency Department (HOSPITAL_COMMUNITY): Payer: Medicare Other

## 2020-07-07 ENCOUNTER — Encounter (HOSPITAL_COMMUNITY): Payer: Self-pay

## 2020-07-07 ENCOUNTER — Inpatient Hospital Stay (HOSPITAL_COMMUNITY)
Admission: EM | Admit: 2020-07-07 | Discharge: 2020-07-14 | DRG: 871 | Disposition: A | Discharge status: Home / Self Care | Payer: Medicare Other | Attending: Internal Medicine | Admitting: Internal Medicine

## 2020-07-07 ENCOUNTER — Other Ambulatory Visit: Payer: Self-pay

## 2020-07-07 DIAGNOSIS — D509 Iron deficiency anemia, unspecified: Secondary | ICD-10-CM

## 2020-07-07 DIAGNOSIS — Z6841 Body Mass Index (BMI) 40.0 and over, adult: Secondary | ICD-10-CM

## 2020-07-07 DIAGNOSIS — I340 Nonrheumatic mitral (valve) insufficiency: Secondary | ICD-10-CM | POA: Diagnosis not present

## 2020-07-07 DIAGNOSIS — K2970 Gastritis, unspecified, without bleeding: Secondary | ICD-10-CM | POA: Diagnosis present

## 2020-07-07 DIAGNOSIS — I4892 Unspecified atrial flutter: Secondary | ICD-10-CM | POA: Diagnosis present

## 2020-07-07 DIAGNOSIS — Z7952 Long term (current) use of systemic steroids: Secondary | ICD-10-CM

## 2020-07-07 DIAGNOSIS — K922 Gastrointestinal hemorrhage, unspecified: Secondary | ICD-10-CM | POA: Diagnosis present

## 2020-07-07 DIAGNOSIS — E785 Hyperlipidemia, unspecified: Secondary | ICD-10-CM | POA: Diagnosis present

## 2020-07-07 DIAGNOSIS — I4891 Unspecified atrial fibrillation: Secondary | ICD-10-CM | POA: Diagnosis present

## 2020-07-07 DIAGNOSIS — N179 Acute kidney failure, unspecified: Secondary | ICD-10-CM | POA: Diagnosis present

## 2020-07-07 DIAGNOSIS — Z7901 Long term (current) use of anticoagulants: Secondary | ICD-10-CM | POA: Diagnosis not present

## 2020-07-07 DIAGNOSIS — I5041 Acute combined systolic (congestive) and diastolic (congestive) heart failure: Secondary | ICD-10-CM | POA: Diagnosis not present

## 2020-07-07 DIAGNOSIS — Z8616 Personal history of COVID-19: Secondary | ICD-10-CM

## 2020-07-07 DIAGNOSIS — I110 Hypertensive heart disease with heart failure: Secondary | ICD-10-CM | POA: Diagnosis present

## 2020-07-07 DIAGNOSIS — Z66 Do not resuscitate: Secondary | ICD-10-CM | POA: Diagnosis present

## 2020-07-07 DIAGNOSIS — L03116 Cellulitis of left lower limb: Secondary | ICD-10-CM | POA: Diagnosis present

## 2020-07-07 DIAGNOSIS — K640 First degree hemorrhoids: Secondary | ICD-10-CM | POA: Diagnosis present

## 2020-07-07 DIAGNOSIS — I5033 Acute on chronic diastolic (congestive) heart failure: Secondary | ICD-10-CM | POA: Diagnosis present

## 2020-07-07 DIAGNOSIS — R195 Other fecal abnormalities: Secondary | ICD-10-CM

## 2020-07-07 DIAGNOSIS — I480 Paroxysmal atrial fibrillation: Secondary | ICD-10-CM | POA: Diagnosis not present

## 2020-07-07 DIAGNOSIS — L309 Dermatitis, unspecified: Secondary | ICD-10-CM | POA: Diagnosis present

## 2020-07-07 DIAGNOSIS — I878 Other specified disorders of veins: Secondary | ICD-10-CM | POA: Diagnosis present

## 2020-07-07 DIAGNOSIS — I5031 Acute diastolic (congestive) heart failure: Secondary | ICD-10-CM | POA: Diagnosis not present

## 2020-07-07 DIAGNOSIS — Z79899 Other long term (current) drug therapy: Secondary | ICD-10-CM

## 2020-07-07 DIAGNOSIS — I5043 Acute on chronic combined systolic (congestive) and diastolic (congestive) heart failure: Secondary | ICD-10-CM | POA: Diagnosis not present

## 2020-07-07 DIAGNOSIS — Z86718 Personal history of other venous thrombosis and embolism: Secondary | ICD-10-CM

## 2020-07-07 DIAGNOSIS — A419 Sepsis, unspecified organism: Principal | ICD-10-CM | POA: Diagnosis present

## 2020-07-07 DIAGNOSIS — R7303 Prediabetes: Secondary | ICD-10-CM | POA: Diagnosis present

## 2020-07-07 DIAGNOSIS — I484 Atypical atrial flutter: Secondary | ICD-10-CM | POA: Diagnosis not present

## 2020-07-07 DIAGNOSIS — K760 Fatty (change of) liver, not elsewhere classified: Secondary | ICD-10-CM

## 2020-07-07 DIAGNOSIS — I482 Chronic atrial fibrillation, unspecified: Secondary | ICD-10-CM | POA: Diagnosis present

## 2020-07-07 DIAGNOSIS — R634 Abnormal weight loss: Secondary | ICD-10-CM | POA: Diagnosis present

## 2020-07-07 DIAGNOSIS — L03115 Cellulitis of right lower limb: Secondary | ICD-10-CM | POA: Diagnosis present

## 2020-07-07 DIAGNOSIS — K5730 Diverticulosis of large intestine without perforation or abscess without bleeding: Secondary | ICD-10-CM | POA: Diagnosis present

## 2020-07-07 DIAGNOSIS — G4733 Obstructive sleep apnea (adult) (pediatric): Secondary | ICD-10-CM | POA: Diagnosis present

## 2020-07-07 DIAGNOSIS — Z8719 Personal history of other diseases of the digestive system: Secondary | ICD-10-CM

## 2020-07-07 DIAGNOSIS — E872 Acidosis: Secondary | ICD-10-CM | POA: Diagnosis present

## 2020-07-07 DIAGNOSIS — I4819 Other persistent atrial fibrillation: Secondary | ICD-10-CM | POA: Diagnosis not present

## 2020-07-07 DIAGNOSIS — D649 Anemia, unspecified: Secondary | ICD-10-CM | POA: Diagnosis not present

## 2020-07-07 DIAGNOSIS — E1151 Type 2 diabetes mellitus with diabetic peripheral angiopathy without gangrene: Secondary | ICD-10-CM | POA: Diagnosis present

## 2020-07-07 DIAGNOSIS — E876 Hypokalemia: Secondary | ICD-10-CM | POA: Diagnosis not present

## 2020-07-07 DIAGNOSIS — J9601 Acute respiratory failure with hypoxia: Secondary | ICD-10-CM | POA: Diagnosis present

## 2020-07-07 DIAGNOSIS — Z86010 Personal history of colonic polyps: Secondary | ICD-10-CM | POA: Diagnosis not present

## 2020-07-07 LAB — COMPREHENSIVE METABOLIC PANEL
ALT: 18 U/L (ref 0–44)
AST: 22 U/L (ref 15–41)
Albumin: 3.2 g/dL — ABNORMAL LOW (ref 3.5–5.0)
Alkaline Phosphatase: 74 U/L (ref 38–126)
Anion gap: 13 (ref 5–15)
BUN: 15 mg/dL (ref 8–23)
CO2: 25 mmol/L (ref 22–32)
Calcium: 9.2 mg/dL (ref 8.9–10.3)
Chloride: 99 mmol/L (ref 98–111)
Creatinine, Ser: 0.94 mg/dL (ref 0.61–1.24)
GFR, Estimated: >60 mL/min (ref >60)
Glucose, Bld: 122 mg/dL — ABNORMAL HIGH (ref 70–99)
Potassium: 4.2 mmol/L (ref 3.5–5.1)
Sodium: 137 mmol/L (ref 135–145)
Total Bilirubin: 0.9 mg/dL (ref 0.3–1.2)
Total Protein: 6.7 g/dL (ref 6.5–8.1)

## 2020-07-07 LAB — CBC WITH DIFFERENTIAL/PLATELET
Abs Immature Granulocytes: 0.09 10*3/uL — ABNORMAL HIGH (ref 0.00–0.07)
Basophils Absolute: 0.1 10*3/uL (ref 0.0–0.1)
Basophils Relative: 0 %
Eosinophils Absolute: 0.1 10*3/uL (ref 0.0–0.5)
Eosinophils Relative: 1 %
HCT: 36.5 % — ABNORMAL LOW (ref 39.0–52.0)
Hemoglobin: 10.6 g/dL — ABNORMAL LOW (ref 13.0–17.0)
Immature Granulocytes: 1 %
Lymphocytes Relative: 18 %
Lymphs Abs: 2.5 10*3/uL (ref 0.7–4.0)
MCH: 28.4 pg (ref 26.0–34.0)
MCHC: 29 g/dL — ABNORMAL LOW (ref 30.0–36.0)
MCV: 97.9 fL (ref 80.0–100.0)
Monocytes Absolute: 1.3 10*3/uL — ABNORMAL HIGH (ref 0.1–1.0)
Monocytes Relative: 10 %
Neutro Abs: 9.6 10*3/uL — ABNORMAL HIGH (ref 1.7–7.7)
Neutrophils Relative %: 70 %
Platelets: 272 10*3/uL (ref 150–400)
RBC: 3.73 MIL/uL — ABNORMAL LOW (ref 4.22–5.81)
RDW: 15.3 % (ref 11.5–15.5)
WBC: 13.7 10*3/uL — ABNORMAL HIGH (ref 4.0–10.5)
nRBC: 0 % (ref 0.0–0.2)

## 2020-07-07 LAB — POC OCCULT BLOOD, ED: Fecal Occult Bld: POSITIVE — AB

## 2020-07-07 LAB — LACTIC ACID, PLASMA
Lactic Acid, Venous: 2.5 mmol/L (ref 0.5–1.9)
Lactic Acid, Venous: 1.4 mmol/L (ref 0.5–1.9)

## 2020-07-07 LAB — RESP PANEL BY RT-PCR (FLU A&B, COVID) ARPGX2
Influenza A by PCR: NEGATIVE
Influenza B by PCR: NEGATIVE
SARS Coronavirus 2 by RT PCR: NEGATIVE

## 2020-07-07 LAB — BRAIN NATRIURETIC PEPTIDE: B Natriuretic Peptide: 276.1 pg/mL — ABNORMAL HIGH (ref 0.0–100.0)

## 2020-07-07 LAB — PROTIME-INR
INR: 1.3 — ABNORMAL HIGH (ref 0.8–1.2)
Prothrombin Time: 15.3 seconds — ABNORMAL HIGH (ref 11.4–15.2)

## 2020-07-07 LAB — TROPONIN I (HIGH SENSITIVITY)
Troponin I (High Sensitivity): 14 ng/L (ref <18)
Troponin I (High Sensitivity): 17 ng/L (ref <18)

## 2020-07-07 MED ORDER — SODIUM CHLORIDE 0.9% FLUSH
3.0000 mL | Freq: Two times a day (BID) | INTRAVENOUS | Status: DC
  Administered 2020-07-07 – 2020-07-13 (×9): 3 mL via INTRAVENOUS

## 2020-07-07 MED ORDER — SODIUM CHLORIDE 0.9 % IV SOLN
2.0000 g | INTRAVENOUS | Status: DC
  Administered 2020-07-08 – 2020-07-09 (×2): 2 g via INTRAVENOUS
  Filled 2020-07-07 (×2): qty 20

## 2020-07-07 MED ORDER — VANCOMYCIN HCL 2000 MG/400ML IV SOLN
2000.0000 mg | Freq: Once | INTRAVENOUS | Status: AC
  Administered 2020-07-07: 2000 mg via INTRAVENOUS
  Filled 2020-07-07: qty 400

## 2020-07-07 MED ORDER — ACETAMINOPHEN 325 MG PO TABS: 650.0000 mg | ORAL_TABLET | ORAL | Status: DC | PRN

## 2020-07-07 MED ORDER — GATIFLOXACIN 0.5 % OP SOLN
1.0000 [drp] | Freq: Four times a day (QID) | OPHTHALMIC | Status: DC
  Administered 2020-07-07 – 2020-07-14 (×19): 1 [drp] via OPHTHALMIC
  Filled 2020-07-07 (×2): qty 2.5

## 2020-07-07 MED ORDER — SODIUM CHLORIDE 0.9 % IV SOLN
2.0000 g | Freq: Once | INTRAVENOUS | Status: AC
  Administered 2020-07-07: 2 g via INTRAVENOUS
  Filled 2020-07-07: qty 20

## 2020-07-07 MED ORDER — DILTIAZEM HCL-DEXTROSE 125-5 MG/125ML-% IV SOLN (PREMIX)
5.0000 mg/h | INTRAVENOUS | Status: DC
  Administered 2020-07-07: 5 mg/h via INTRAVENOUS
  Filled 2020-07-07: qty 125

## 2020-07-07 MED ORDER — TRIAMCINOLONE ACETONIDE 0.1 % EX CREA
1.0000 "application " | TOPICAL_CREAM | Freq: Two times a day (BID) | CUTANEOUS | Status: DC
  Administered 2020-07-07 – 2020-07-14 (×11): 1 via TOPICAL
  Filled 2020-07-07: qty 15

## 2020-07-07 MED ORDER — IPRATROPIUM BROMIDE 0.02 % IN SOLN
0.5000 mg | Freq: Four times a day (QID) | RESPIRATORY_TRACT | Status: DC
  Filled 2020-07-07: qty 2.5

## 2020-07-07 MED ORDER — AMIODARONE HCL IN DEXTROSE 360-4.14 MG/200ML-% IV SOLN
60.0000 mg/h | INTRAVENOUS | Status: AC
  Administered 2020-07-07 – 2020-07-08 (×2): 60 mg/h via INTRAVENOUS
  Filled 2020-07-07 (×2): qty 200

## 2020-07-07 MED ORDER — ONDANSETRON HCL 4 MG/2ML IJ SOLN: 4.0000 mg | Freq: Four times a day (QID) | INTRAMUSCULAR | Status: DC | PRN

## 2020-07-07 MED ORDER — ACETAMINOPHEN 500 MG PO TABS
1000.0000 mg | ORAL_TABLET | Freq: Once | ORAL | Status: AC
  Administered 2020-07-07: 1000 mg via ORAL
  Filled 2020-07-07: qty 2

## 2020-07-07 MED ORDER — PANTOPRAZOLE SODIUM 40 MG PO TBEC
40.0000 mg | DELAYED_RELEASE_TABLET | Freq: Every day | ORAL | Status: DC
  Administered 2020-07-07 – 2020-07-14 (×8): 40 mg via ORAL
  Filled 2020-07-07 (×8): qty 1

## 2020-07-07 MED ORDER — SODIUM CHLORIDE 0.9 % IV BOLUS
1000.0000 mL | Freq: Once | INTRAVENOUS | Status: AC
  Administered 2020-07-07: 1000 mL via INTRAVENOUS

## 2020-07-07 MED ORDER — DILTIAZEM LOAD VIA INFUSION
10.0000 mg | Freq: Once | INTRAVENOUS | Status: AC
  Administered 2020-07-07: 10 mg via INTRAVENOUS
  Filled 2020-07-07: qty 10

## 2020-07-07 MED ORDER — APIXABAN 5 MG PO TABS
5.0000 mg | ORAL_TABLET | Freq: Two times a day (BID) | ORAL | Status: DC
  Administered 2020-07-07 – 2020-07-08 (×2): 5 mg via ORAL
  Filled 2020-07-07 (×2): qty 1

## 2020-07-07 MED ORDER — VANCOMYCIN HCL IN DEXTROSE 1-5 GM/200ML-% IV SOLN
1000.0000 mg | Freq: Once | INTRAVENOUS | Status: DC
  Filled 2020-07-07: qty 200

## 2020-07-07 MED ORDER — AMIODARONE LOAD VIA INFUSION
150.0000 mg | Freq: Once | INTRAVENOUS | Status: AC
  Administered 2020-07-07: 150 mg via INTRAVENOUS
  Filled 2020-07-07: qty 83.34

## 2020-07-07 MED ORDER — SODIUM CHLORIDE 0.9% FLUSH: 3.0000 mL | INTRAVENOUS | Status: DC | PRN

## 2020-07-07 MED ORDER — AMIODARONE HCL IN DEXTROSE 360-4.14 MG/200ML-% IV SOLN
30.0000 mg/h | INTRAVENOUS | Status: DC
  Administered 2020-07-08 – 2020-07-14 (×12): 30 mg/h via INTRAVENOUS
  Filled 2020-07-07 (×12): qty 200

## 2020-07-07 MED ORDER — VANCOMYCIN HCL 750 MG/150ML IV SOLN
750.0000 mg | Freq: Two times a day (BID) | INTRAVENOUS | Status: DC
  Administered 2020-07-08 – 2020-07-10 (×5): 750 mg via INTRAVENOUS
  Filled 2020-07-07 (×6): qty 150

## 2020-07-07 MED ORDER — SODIUM CHLORIDE 0.9 % IV SOLN
250.0000 mL | INTRAVENOUS | Status: DC | PRN
  Administered 2020-07-08 (×2): 250 mL via INTRAVENOUS

## 2020-07-07 NOTE — ED Notes (Signed)
Dr Delane Ginger made aware of pts low BP, order for fluid bolus

## 2020-07-07 NOTE — Progress Notes (Signed)
Patient is refusing CPAP.  No distress noted at this time. Will continue to monitor.

## 2020-07-07 NOTE — Progress Notes (Signed)
Pharmacy Antibiotic Note  Shane Diaz is a 74 y.o. male admitted on 07/07/2020 with cellulitis.  Pharmacy has been consulted for Vancomycin dosing.   Height: 5\' 7"  (170.2 cm) Weight: 117.9 kg (260 lb) IBW/kg (Calculated) : 66.1  Temp (24hrs), Avg:100.3 F (37.9 C), Min:100.3 F (37.9 C), Max:100.3 F (37.9 C)  Recent Labs  Lab 07/07/20 1505  WBC 13.7*  CREATININE 0.94  LATICACIDVEN 2.5*    Estimated Creatinine Clearance: 84.6 mL/min (by C-G formula based on SCr of 0.94 mg/dL).    No Known Allergies  Antimicrobials this admission: 12/4 Ceftriaxone >>  12/4 Vancomycin >>   Dose adjustments this admission: N/a  Microbiology results: Pending   Plan:  - Vancomycin 2000mg  IV x 1 dose  - Followed by Vancomycin 750mg  IV q12h - Goal trough 10-15  - Monitor patients renal function and urine output  - De-escalate ABX when appropriate   Thank you for allowing pharmacy to be a part of this patient's care.  Duanne Limerick PharmD. BCPS 07/07/2020 4:32 PM

## 2020-07-07 NOTE — ED Provider Notes (Signed)
[facility] EMERGENCY DEPARTMENT Provider Note   CSN: [id number] Arrival date & time: 07/07/20  1446     History Chief Complaint  Patient presents with  . Chest Pain  . Shortness of Breath  . Leg Swelling    Shane Diaz is a 74 y.o. male.  Patient c/o mid/lower chest pain and sob in the past couple days. Symptoms acute onset, moderate, constant, dull, non pleuritic, non radiating. Denies palpitations but is noted to be in rapid afib, hx afib - pt unsure when may have started or gotten faster. States compliant w home meds. Also notes increased bilateral leg swelling and redness in past few days, bil lower legs weeping/clear fluid. Unaware of fever at home, although noted with low grade fever in ED. On cough or uri symptoms. No headache. No neck pain or stiffness. No spine/back pain. No abd pain or nvd. No dysuria or gu c/o.   The history is provided by the patient and the EMS personnel.  Chest Pain Associated symptoms: fever and shortness of breath   Associated symptoms: no abdominal pain, no back pain, no cough, no headache, no palpitations and no vomiting   Shortness of Breath Associated symptoms: chest pain and fever   Associated symptoms: no abdominal pain, no cough, no headaches, no neck pain, no rash, no sore throat and no vomiting        Past Medical History:  Diagnosis Date  . Arthritis   . Diverticulosis 05/2015   Mild, noted on colonosocpy  . Fatty liver 05/14/2015   noted on [place] ABD  . History of colon polyps    sessile in ascending colon, pedunculated in sigmoid colon  . Hyperlipidemia    pt denies  . Hypertension    not currently taking medications per MD  . 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 Umbilical hernia   . Varicose veins     Patient Active Problem List   Diagnosis Date Noted  . Pneumonia due to COVID-19 virus 05/08/2020  . Sepsis ([place]) 05/08/2020  . Atrial fibrillation with rapid ventricular response ([place])  05/08/2020  . Elevated troponin 05/08/2020  . Essential hypertension 05/08/2020  . Varicose veins of leg with complications 24/40/1027  . Varicose veins of left lower extremity with complications 25/36/6440  . Varicose veins of bilateral lower extremities with other complications 34/74/2595  . Special screening for malignant neoplasms, colon 04/11/2014    Past Surgical History:  Procedure Laterality Date  . APPENDECTOMY     childhood  . COLONOSCOPY W/ POLYPECTOMY  05/23/2014  . UMBILICAL HERNIA REPAIR N/A 04/27/2018   Procedure: REPAIR UMBILICAL HERNIA ERAS PATHWAY;  Surgeon: Alphonsa Overall, MD;  Location: [facility] ORS;  Service: General;  Laterality: N/A;  . varicose veins    . WRIST SURGERY         Family History  Problem Relation Age of Onset  . Colon cancer Neg Hx   . Esophageal cancer Neg Hx   . Pancreatic cancer Neg Hx   . Prostate cancer Neg Hx   . Rectal cancer Neg Hx   . Stomach cancer Neg Hx     Social History   Tobacco Use  . Smoking status: Never Smoker  . Smokeless tobacco: Never Used  Vaping Use  . Vaping Use: Never used  Substance Use Topics  . Alcohol use: Yes    Alcohol/week: 0.0 standard drinks    Comment: occassional wine, martini  . Drug use: No  Home Medications Prior to Admission medications   Medication Sig Start Date End Date Taking? Authorizing Provider  apixaban (ELIQUIS) 5 MG TABS tablet Take 1 tablet (5 mg total) by mouth 2 (two) times daily. 06/01/19   [name], [name], MD  dexamethasone (DECADRON) 6 MG tablet Take 1 tablet (6 mg total) by mouth daily. 05/10/20   Elgergawy, Silver Huguenin, MD  gatifloxacin (ZYMAXID) 0.5 % SOLN Place 1 drop into the left eye 4 (four) times daily. 04/03/20   [provider]  metoprolol tartrate (LOPRESSOR) 25 MG tablet Take 1 tablet (25 mg total) by mouth 2 (two) times daily. 05/10/20   Elgergawy, Silver Huguenin, MD  pantoprazole (PROTONIX) 40 MG tablet Take 1 tablet (40 mg total) by mouth daily. 05/10/20   Elgergawy,  Silver Huguenin, MD  PROLENSA 0.07 % SOLN Place 1 drop into the right eye daily. 04/03/20   [provider]    Allergies    Patient has no known allergies.  Review of Systems   Review of Systems  Constitutional: Positive for fever.  HENT: Negative for sore throat.   Eyes: Negative for redness.  Respiratory: Positive for shortness of breath. Negative for cough.   Cardiovascular: Positive for chest pain and leg swelling. Negative for palpitations.  Gastrointestinal: Negative for abdominal pain, diarrhea and vomiting.  Endocrine: Negative for polyuria.  Genitourinary: Negative for dysuria and flank pain.  Musculoskeletal: Negative for back pain and neck pain.  Skin: Negative for rash.  Neurological: Negative for headaches.  Hematological: Does not bruise/bleed easily.  Psychiatric/Behavioral: Negative for confusion.    Physical Exam Updated Vital Signs BP 102/74   Pulse (!) 150   Temp 100.3 F (37.9 C) (Oral)   Resp (!) 23   Ht 1.702 m (5\' 7" )   Wt 117.9 kg   SpO2 100%   BMI 40.72 kg/m   Physical Exam Vitals and nursing note reviewed.  Constitutional:      General: He is in acute distress.     Appearance: Normal appearance. He is well-developed.  HENT:     Head: Atraumatic.     Nose: Nose normal.     Mouth/Throat:     Mouth: Mucous membranes are moist.     Pharynx: Oropharynx is clear.  Eyes:     General: No scleral icterus.    Conjunctiva/sclera: Conjunctivae normal.     Pupils: Pupils are equal, round, and reactive to light.  Neck:     Trachea: No tracheal deviation.     Comments: No stiffness or rigidity.  Cardiovascular:     Rate and Rhythm: Regular rhythm. Tachycardia present.     Pulses: Normal pulses.     Heart sounds: Normal heart sounds. No murmur heard.  No friction rub. No gallop.   Pulmonary:     Effort: Pulmonary effort is normal. No accessory muscle usage or respiratory distress.     Breath sounds: Normal breath sounds.  Abdominal:      General: Bowel sounds are normal. There is no distension.     Palpations: Abdomen is soft.     Tenderness: There is no abdominal tenderness. There is no guarding.     Comments: Obese.   Genitourinary:    Comments: No cva tenderness. Medium brown stool - does test heme pos.  Musculoskeletal:        General: Swelling present.     Cervical back: Normal range of motion and neck supple. No rigidity.     Right lower leg: Edema present.  Left lower leg: Edema present.     Comments: Severe swelling bilateral legs, with weeping of clear fluid bilateral anterior lower legs. Redness to bilateral lower legs, with erythema and increased warmth streaking up medial aspect right leg concerning for infection/cellulitis.   Skin:    General: Skin is warm and dry.     Findings: No rash.  Neurological:     Mental Status: He is alert.     Comments: Alert, speech clear.   Psychiatric:        Mood and Affect: Mood normal.      ED Results / Procedures / Treatments   Labs (all labs ordered are listed, but only abnormal results are displayed) Results for orders placed or performed during the hospital encounter of 07/07/20  Resp Panel by RT-PCR (Flu A&B, Covid) Nasopharyngeal Swab   Specimen: Nasopharyngeal Swab; Nasopharyngeal(NP) swabs in vial transport medium  Result Value Ref Range   SARS Coronavirus 2 by RT PCR NEGATIVE NEGATIVE   Influenza A by PCR NEGATIVE NEGATIVE   Influenza B by PCR NEGATIVE NEGATIVE  Lactic acid, plasma  Result Value Ref Range   Lactic Acid, Venous 2.5 (HH) 0.5 - 1.9 mmol/L  Lactic acid, plasma  Result Value Ref Range   Lactic Acid, Venous 1.4 0.5 - 1.9 mmol/L  Comprehensive metabolic panel  Result Value Ref Range   Sodium 137 135 - 145 mmol/L   Potassium 4.2 3.5 - 5.1 mmol/L   Chloride 99 98 - 111 mmol/L   CO2 25 22 - 32 mmol/L   Glucose, Bld 122 (H) 70 - 99 mg/dL   BUN 15 8 - 23 mg/dL   Creatinine, Ser 0.94 0.61 - 1.24 mg/dL   Calcium 9.2 8.9 - 10.3 mg/dL    Total Protein 6.7 6.5 - 8.1 g/dL   Albumin 3.2 (L) 3.5 - 5.0 g/dL   AST 22 15 - 41 U/L   ALT 18 0 - 44 U/L   Alkaline Phosphatase 74 38 - 126 U/L   Total Bilirubin 0.9 0.3 - 1.2 mg/dL   GFR, Estimated >60 >60 mL/min   Anion gap 13 5 - 15  CBC WITH DIFFERENTIAL  Result Value Ref Range   WBC 13.7 (H) 4.0 - 10.5 K/uL   RBC 3.73 (L) 4.22 - 5.81 MIL/uL   Hemoglobin 10.6 (L) 13.0 - 17.0 g/dL   HCT 36.5 (L) 39 - 52 %   MCV 97.9 80.0 - 100.0 fL   MCH 28.4 26.0 - 34.0 pg   MCHC 29.0 (L) 30.0 - 36.0 g/dL   RDW 15.3 11.5 - 15.5 %   Platelets 272 150 - 400 K/uL   nRBC 0.0 0.0 - 0.2 %   Neutrophils Relative % 70 %   Neutro Abs 9.6 (H) 1.7 - 7.7 K/uL   Lymphocytes Relative 18 %   Lymphs Abs 2.5 0.7 - 4.0 K/uL   Monocytes Relative 10 %   Monocytes Absolute 1.3 (H) 0.1 - 1.0 K/uL   Eosinophils Relative 1 %   Eosinophils Absolute 0.1 0.0 - 0.5 K/uL   Basophils Relative 0 %   Basophils Absolute 0.1 0.0 - 0.1 K/uL   Immature Granulocytes 1 %   Abs Immature Granulocytes 0.09 (H) 0.00 - 0.07 K/uL  Protime-INR  Result Value Ref Range   Prothrombin Time 15.3 (H) 11.4 - 15.2 seconds   INR 1.3 (H) 0.8 - 1.2  Brain natriuretic peptide  Result Value Ref Range   B Natriuretic Peptide 276.1 (H) 0.0 - 100.0  pg/mL  POC occult blood, ED Provider will collect  Result Value Ref Range   Fecal Occult Bld POSITIVE (A) NEGATIVE  Troponin I (High Sensitivity)  Result Value Ref Range   Troponin I (High Sensitivity) 14 <18 ng/L  Troponin I (High Sensitivity)  Result Value Ref Range   Troponin I (High Sensitivity) 17 <18 ng/L    EKG EKG Interpretation  Date/Time:  Saturday July 07 2020 15:02:10 EST Ventricular Rate:  152 PR Interval:    QRS Duration: 130 QT Interval:  298 QTC Calculation: 474 R Axis:   70 Text Interpretation: Atrial flutter with 2 to 1 block Right bundle branch block Baseline wander in lead(s) I II aVR aVL Confirmed by Lajean Saver [phone]) on 07/07/2020 3:15:08  PM   Radiology DG Chest Port 1 View  Result Date: 07/07/2020 CLINICAL DATA:  Questionable sepsis.  Evaluate for abnormality. EXAM: PORTABLE CHEST 1 VIEW COMPARISON:  May 07, 2020 FINDINGS: Stable cardiomegaly. Mild atelectasis in the left base. No pneumothorax. The lungs are clear. No other abnormalities. IMPRESSION: No active disease. Electronically Signed   By: Dorise Bullion III M.D   On: 07/07/2020 15:53    Procedures Procedures (including critical care time)  Medications Ordered in ED Medications  cefTRIAXone (ROCEPHIN) 2 g in sodium chloride 0.9 % 100 mL IVPB (has no administration in time range)  diltiazem (CARDIZEM) 1 mg/mL load via infusion 10 mg (10 mg Intravenous Bolus from Bag 07/07/20 1542)    And  diltiazem (CARDIZEM) 125 mg in dextrose 5% 125 mL (1 mg/mL) infusion (5 mg/hr Intravenous New Bag/Given 07/07/20 1542)  vancomycin (VANCOREADY) IVPB 2000 mg/400 mL (has no administration in time range)  acetaminophen (TYLENOL) tablet 1,000 mg (1,000 mg Oral Given 07/07/20 1539)    ED Course  I have reviewed the triage vital signs and the nursing notes.  Pertinent labs & imaging results that were available during my care of the patient were reviewed by me and considered in my medical decision making (see chart for details).    MDM Rules/Calculators/A&P                          Iv ns. Continuous pulse ox and cardiac monitoring. Stat labs.  o2 [place].   Reviewed nursing notes and prior charts for additional history.   Sepsis labs/code sepsis. Post cultures, iv antibiotics.   cardizem bolus and gtt for rate control.   Recheck post cardizem, bp low. Ns bolus.   MDM Number of Diagnoses or Management Options   Amount and/or Complexity of Data Reviewed Clinical lab tests: ordered and reviewed Tests in the radiology section of CPT: ordered and reviewed Tests in the medicine section of CPT: ordered and reviewed Discussion of test results with the performing providers:  yes Decide to obtain previous medical records or to obtain history from someone other than the patient: yes Obtain history from someone other than the patient: yes Review and summarize past medical records: yes Discuss the patient with other providers: yes Independent visualization of images, tracings, or specimens: yes  Risk of Complications, Morbidity, and/or Mortality Presenting problems: high Diagnostic procedures: high Management options: high  initial labs reviewed/interpreted by me - trop normal. hgb lower than previous  - pt denies blood loss, no rectal bleeding or melena, is on eliquis, stool is medium brown, heme positive.   CXR reviewed/interpreted by me - no pna.   Feel pts soft bp, slightly elevated lactate, severe peripheral edema, more related  to rapid hr/afib/flutter, as opposed to sepsis.  In addition, pt is dnr, has severe peripheral edema, is on home o2, with increased o2 requirement already - as such, in discussing tx plan w pt - he declines further fluid bolus.   Will consult cardiology, and admit to medicine. Cardiology indicates no emergent cardioversion for now, they will give trial of amiodarone therapy first, and follow on floor, and decide on possible cardioversion subsequently if remains in a flutter.   Recheck pt, no chest pain or discomfort. bp improved.   CRITICAL CARE RE: atrial flutter with rapid ventricular response, precordial chest pain, elevated lactic acid, severe leg edema and suspected cellulitis right leg,  drop in hemoglobin with heme positive stool. Performed by: Mirna Mires Total critical care time: 115 minutes Critical care time was exclusive of separately billable procedures and treating other patients. Critical care was necessary to treat or prevent imminent or life-threatening deterioration. Critical care was time spent personally by me on the following activities: development of treatment plan with patient and/or surrogate as well as  nursing, discussions with consultants, evaluation of patient's response to treatment, examination of patient, obtaining history from patient or surrogate, ordering and performing treatments and interventions, ordering and review of laboratory studies, ordering and review of radiographic studies, pulse oximetry and re-evaluation of patient's condition.      Final Clinical Impression(s) / ED Diagnoses Final diagnoses:  None    Rx / DC Orders ED Discharge Orders    None       Lajean Saver, MD 07/07/20 2139

## 2020-07-07 NOTE — ED Triage Notes (Signed)
Pt from home with ems for c.o chest pain and sob, pt was in a fib rvr at a rate of 150-160 when EMS arrived. Pt given 10mg  cardizem, 324 ASA and 117ml fluids en route. Pt wearing 3L [place] oxygen at home. HR 150-160 upon arrival to ED. Pitting edema noted to BLE. Pt alert

## 2020-07-07 NOTE — H&P (Addendum)
History and Physical    Shane Diaz ZDG:[id number] DOB: Dec 21, 1945 DOA: 07/07/2020  PCP: [name], MD (Confirm with patient/family/NH records and if not entered, this has to be entered at [facility] point of entry) Patient coming from: Home  I have personally briefly reviewed patient's old medical records in [place]  Chief Complaint: Palpitations  HPI: Shane Diaz is a 74 y.o. male with medical history significant of paroxysmal A. Fib, bilateral DVT and Eliquis, morbid obesity, OSA, prediabetes, presented with worsening of palpitations.  Patient was hospitalized in October for Covid pneumonia, and was noted to be in rapid A. fib and responded to Cardizem at that time and converted back to sinus rhythm, patient was discharged home on low-dose of beta-blocker.  Patient been doing fine until yesterday and started to have occasional palpitations, overnight became persistent.  Also had pressure-like chest pain, and shortness of breath.  Denied any cough no fever chills.  Patient also has had bilateral lower extremity chronic swelling and itchiness and recently became worse. With clear liquid discharge, and tenderness.  Redness and warmth to touch. ED Course: Patient was found to be in A. fib heart rate in 140s, after 3 hours of Cardizem drip heart rate remained in the 130s.  Blood pressure on the low side was given some IV fluid and then stabilized.  Initial lactic acid 2.8>1.4 after IV fluid. Trop negative.  Review of Systems: As per HPI otherwise 14 point review of systems negative.    Past Medical History:  Diagnosis Date  . Arthritis   . Diverticulosis 05/2015   Mild, noted on colonosocpy  . Fatty liver 05/14/2015   noted on [place] ABD  . History of colon polyps    sessile in ascending colon, pedunculated in sigmoid colon  . Hyperlipidemia    pt denies  . Hypertension    not currently taking medications per MD  . 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 Umbilical  hernia   . Varicose veins     Past Surgical History:  Procedure Laterality Date  . APPENDECTOMY     childhood  . COLONOSCOPY W/ POLYPECTOMY  05/23/2014  . UMBILICAL HERNIA REPAIR N/A 04/27/2018   Procedure: REPAIR UMBILICAL HERNIA ERAS PATHWAY;  Surgeon: Alphonsa Overall, MD;  Location: [facility];  Service: General;  Laterality: N/A;  . varicose veins    . WRIST SURGERY       reports that he has never smoked. He has never used smokeless tobacco. He reports current alcohol use. He reports that he does not use drugs.  No Known Allergies  Family History  Problem Relation Age of Onset  . Colon cancer Neg Hx   . Esophageal cancer Neg Hx   . Pancreatic cancer Neg Hx   . Prostate cancer Neg Hx   . Rectal cancer Neg Hx   . Stomach cancer Neg Hx      Prior to Admission medications   Medication Sig Start Date End Date Taking? Authorizing Provider  apixaban (ELIQUIS) 5 MG TABS tablet Take 1 tablet (5 mg total) by mouth 2 (two) times daily. 06/01/19  Yes [name], [name], MD  dexamethasone (DECADRON) 6 MG tablet Take 1 tablet (6 mg total) by mouth daily. 05/10/20  Yes Elgergawy, Silver Huguenin, MD  gatifloxacin (ZYMAXID) 0.5 % SOLN Place 1 drop into the left eye 4 (four) times daily. 04/03/20  Yes [provider]  metoprolol tartrate (LOPRESSOR) 25 MG tablet Take 1 tablet (25 mg  total) by mouth 2 (two) times daily. 05/10/20  Yes Elgergawy, Silver Huguenin, MD  pantoprazole (PROTONIX) 40 MG tablet Take 1 tablet (40 mg total) by mouth daily. 05/10/20  Yes Elgergawy, Silver Huguenin, MD    Physical Exam: Vitals:   07/07/20 1745 07/07/20 1800 07/07/20 1845 07/07/20 1900  BP: (!) 85/75 95/70 (!) 84/66 96/80  Pulse: (!) 147 (!) 147 (!) 146 (!) 147  Resp: 17 (!) 30 (!) 39 (!) 29  Temp:      TempSrc:      SpO2: 99% 98% 100% 99%  Weight:      Height:        Constitutional: NAD, calm, comfortable Vitals:   07/07/20 1745 07/07/20 1800 07/07/20 1845 07/07/20 1900  BP: (!) 85/75 95/70 (!) 84/66 96/80  Pulse:  (!) 147 (!) 147 (!) 146 (!) 147  Resp: 17 (!) 30 (!) 39 (!) 29  Temp:      TempSrc:      SpO2: 99% 98% 100% 99%  Weight:      Height:       Eyes: PERRL, lids and conjunctivae normal ENMT: Mucous membranes are moist. Posterior pharynx clear of any exudate or lesions.Normal dentition.  Neck: normal, supple, no masses, no thyromegaly Respiratory: clear to auscultation bilaterally, no wheezing, no crackles. Normal respiratory effort. No accessory muscle use.  Cardiovascular: Regular, tachycardia, no murmurs / rubs / gallops.  2+ extremity edema. 2+ pedal pulses. No carotid bruits.  Abdomen: no tenderness, no masses palpated. No hepatosplenomegaly. Bowel sounds positive.  Musculoskeletal: no clubbing / cyanosis. No joint deformity upper and lower extremities. Good ROM, no contractures. Normal muscle tone.  Skin: Rash on bilateral shin area, warm to touch and severe tenderness with clear liquid discharge on multiple openings and shallow ulcers. Neurologic: CN 2-12 grossly intact. Sensation intact, DTR normal. Strength 5/5 in all 4.  Psychiatric: Normal judgment and insight. Alert and oriented x 3. Normal mood.       Labs on Admission: I have personally reviewed following labs and imaging studies  CBC: Recent Labs  Lab 07/07/20 1505  WBC 13.7*  NEUTROABS 9.6*  HGB 10.6*  HCT 36.5*  MCV 97.9  PLT 176   Basic Metabolic Panel: Recent Labs  Lab 07/07/20 1505  NA 137  K 4.2  CL 99  CO2 25  GLUCOSE 122*  BUN 15  CREATININE 0.94  CALCIUM 9.2   GFR: Estimated Creatinine Clearance: 84.6 mL/min (by C-G formula based on SCr of 0.94 mg/dL). Liver Function Tests: Recent Labs  Lab 07/07/20 1505  AST 22  ALT 18  ALKPHOS 74  BILITOT 0.9  PROT 6.7  ALBUMIN 3.2*   No results for input(s): LIPASE, AMYLASE in the last 168 hours. No results for input(s): AMMONIA in the last 168 hours. Coagulation Profile: Recent Labs  Lab 07/07/20 1505  INR 1.3*   Cardiac Enzymes: No  results for input(s): CKTOTAL, CKMB, CKMBINDEX, TROPONINI in the last 168 hours. BNP (last 3 results) No results for input(s): PROBNP in the last 8760 hours. HbA1C: No results for input(s): HGBA1C in the last 72 hours. CBG: No results for input(s): GLUCAP in the last 168 hours. Lipid Profile: No results for input(s): CHOL, HDL, LDLCALC, TRIG, CHOLHDL, LDLDIRECT in the last 72 hours. Thyroid Function Tests: No results for input(s): TSH, T4TOTAL, FREET4, T3FREE, THYROIDAB in the last 72 hours. Anemia Panel: No results for input(s): VITAMINB12, FOLATE, FERRITIN, TIBC, IRON, RETICCTPCT in the last 72 hours. Urine analysis: No results found  for: COLORURINE, APPEARANCEUR, LABSPEC, [place], GLUCOSEU, HGBUR, BILIRUBINUR, KETONESUR, PROTEINUR, UROBILINOGEN, NITRITE, LEUKOCYTESUR  Radiological Exams on Admission: DG Chest Port 1 View  Result Date: 07/07/2020 CLINICAL DATA:  Questionable sepsis.  Evaluate for abnormality. EXAM: PORTABLE CHEST 1 VIEW COMPARISON:  May 07, 2020 FINDINGS: Stable cardiomegaly. Mild atelectasis in the left base. No pneumothorax. The lungs are clear. No other abnormalities. IMPRESSION: No active disease. Electronically Signed   By: Dorise Bullion III M.D   On: 07/07/2020 15:53    EKG: Rapid A. fib  Assessment/Plan Active Problems:   A-fib ([place])  (please populate well all problems here in Problem List. (For example, if patient is on BP meds at home and you resume or decide to hold them, it is a problem that needs to be her. Same for CAD, COPD, HLD and so on)  A. fib with RVR -Renal impaired does not respond to Cardizem drip, discussed with on-call cardiology fellow, both agreed with amiodarone drip for now. -Patient has history of fatty liver but no history of cirrhosis and appears liver synthetic function intact.  Will check RUQ ultrasound. -BP borderline low, however patient mentation appears to be stable, discussed with cardiology fellow, monitor closely  overnight in stepdown unit, low threshold for cardioversion if patient mentation deteriorated or BP not maintaining. -DC IV fluids given severe swelling of legs. -Continue Eliquis -Echo when heart rate more controlled. -Restart metoprolol when pressure more controlled.  Sepsis secondary to bilateral lower extremity cellulitis -Send MRSA screen and ASO -BP responded to IVF and Lactic acid normalized -Continue vancomycin and ceftriaxone. -Appears to have superimposed dermatitis probably from a venous stasis, add topical triamcinolone.  Acute chest decompensation, likely diastolic -Secondary to uncontrolled A. Fib, rate control regimen as above -Appears to be fluid overload, but blood pressure borderline. Plan to rate control first, start diuresis once heart rate more controlled and BP stabilized.  OSA -CPAP at bedtime  History of unprovoked DVT -On Eliquis  DVT prophylaxis: Eliquis Code Status: DNR Family Communication: None at bedside Disposition Plan: Patient is sick with multiple conditions Consults called: Cardiology Admission status: PCU   Lequita Halt MD [facility] Pager [phone]  07/07/2020, 7:41 PM

## 2020-07-07 NOTE — Consult Note (Signed)
Cardiology Consultation:   Patient ID: Shane Diaz MRN: [id number]; DOB: 1945-09-26  Admit date: 07/07/2020 Date of Consult: 07/07/2020  Primary Care Provider: [name], MD [place] Cardiologist: No primary care provider on file.  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    History of Present Illness:  Shane Diaz reports that around 44 AM today he had acute onset left-sided shortness of breath that was fairly severe prompting him to seek evaluation.  Shortness of breath was transient and was not associated with chest pain, chest pressure, diaphoresis, nausea, or vomiting.  He does recall that over the past few months he has noticed that his heart rate is often in the 130-150 range when he checks his pulse at home.  During his episodes he is otherwise asymptomatic and has no perception of palpitations or irregular heart rhythm.    He was recently admitted (05/07/20-05/10/20) for generalized weakness and found to be in AF/RVR and was given diltiazem and IVFs.  During the hospitalization he was found to have Covid pneumonia and received several days of dexamethasone and was ultimately discharged on 2 L nasal cannula home oxygen.  He was already on therapeutic anticoagulation with Eliquis 5 mg p.o. twice daily for bilateral DVTs in the past.   I spoke with his daughter Peter [place]) over the phone and she said that his primary complaint was shortness of breath with some associated chest discomfort although he reports shortness of breath alone.  She says he has not been in a very good state of health and is fairly sedentary around the house.  She thinks that his activity is just from the chair to the bed and a few steps around the house.  She admits that she is  estranged from her dad and has not seen him in several months and is not very close to him.  His wife speaks [place] only all Shane Diaz speaks both [place] and [place].  He does not keep track of his weights at home but has noticed a significant increase in his lower extremity edema over the past month.  Past Medical History:  Diagnosis Date  . Arthritis   . Diverticulosis 05/2015   Mild, noted on colonosocpy  . Fatty liver 05/14/2015   noted on [place] ABD  . History of colon polyps    sessile in ascending colon, pedunculated in sigmoid colon  . Hyperlipidemia    pt denies  . Hypertension    not currently taking medications per MD  . 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 Umbilical hernia   . Varicose veins     Past Surgical History:  Procedure Laterality Date  . APPENDECTOMY     childhood  . COLONOSCOPY W/ POLYPECTOMY  05/23/2014  . UMBILICAL HERNIA REPAIR N/A 04/27/2018   Procedure: REPAIR UMBILICAL HERNIA ERAS PATHWAY;  Surgeon: Alphonsa Overall, MD;  Location: [facility];  Service: General;  Laterality: N/A;  . varicose veins    . WRIST SURGERY      Home Medications:  Prior to Admission medications   Medication Sig Start Date End Date Taking? Authorizing Provider  apixaban (ELIQUIS) 5 MG TABS tablet Take 1 tablet (5 mg total) by mouth 2 (two) times daily. 06/01/19  Yes  [name], [name], MD  dexamethasone (DECADRON) 6 MG tablet Take 1 tablet (6 mg total) by mouth daily. 05/10/20  Yes Elgergawy, Silver Huguenin, MD  gatifloxacin (ZYMAXID) 0.5 % SOLN Place 1 drop into the left eye 4 (four) times daily. 04/03/20  Yes [provider]  metoprolol tartrate (LOPRESSOR) 25 MG tablet Take 1 tablet (25 mg total) by mouth 2 (two) times daily. 05/10/20  Yes Elgergawy, Silver Huguenin, MD  pantoprazole (PROTONIX) 40 MG tablet Take 1 tablet (40 mg total) by mouth daily. 05/10/20  Yes Elgergawy, Silver Huguenin, MD    Inpatient Medications: Scheduled Meds:  Continuous Infusions: . diltiazem  (CARDIZEM) infusion 7.5 mg/hr (07/07/20 1624)  . vancomycin 2,000 mg (07/07/20 1626)  . [START ON 07/08/2020] vancomycin     PRN Meds:   Allergies:   No Known Allergies  Social History:   Social History   Socioeconomic History  . Marital status: Married    Spouse name: Not on file  . Number of children: Not on file  . Years of education: Not on file  . Highest education level: Not on file  Occupational History  . Not on file  Tobacco Use  . Smoking status: Never Smoker  . Smokeless tobacco: Never Used  Vaping Use  . Vaping Use: Never used  Substance and Sexual Activity  . Alcohol use: Yes    Alcohol/week: 0.0 standard drinks    Comment: occassional wine, martini  . Drug use: No  . Sexual activity: Not on file  Other Topics Concern  . Not on file  Social History Narrative  . Not on file   Social Determinants of Health   Financial Resource Strain:   . Difficulty of Paying Living Expenses: Not on file  Food Insecurity:   . Worried About Charity fundraiser in the Last Year: Not on file  . Ran Out of Food in the Last Year: Not on file  Transportation Needs:   . Lack of Transportation (Medical): Not on file  . Lack of Transportation (Non-Medical): Not on file  Physical Activity:   . Days of Exercise per Week: Not on file  . Minutes of Exercise per Session: Not on file  Stress:   . Feeling of Stress : Not on file  Social Connections:   . Frequency of Communication with Friends and Family: Not on file  . Frequency of Social Gatherings with Friends and Family: Not on file  . Attends Religious Services: Not on file  . Active Member of Clubs or Organizations: Not on file  . Attends Archivist Meetings: Not on file  . Marital Status: Not on file  Intimate Partner Violence:   . Fear of Current or Ex-Partner: Not on file  . Emotionally Abused: Not on file  . Physically Abused: Not on file  . Sexually Abused: Not on file    Family History:    Family  History  Problem Relation Age of Onset  . Colon cancer Neg Hx   . Esophageal cancer Neg Hx   . Pancreatic cancer Neg Hx   . Prostate cancer Neg Hx   . Rectal cancer Neg Hx   . Stomach cancer Neg Hx     ROS:  Review of Systems: [y] = yes, [ ]  = no       General: Weight gain [ ] ; Weight loss [ ] ; Anorexia [ ] ; Fatigue [ ] ; Fever [ ] ; Chills [ ] ; Weakness [ ]     Cardiac: Chest pain/pressure [ ] ;  Resting SOB [ ] ; Exertional SOB [x] ; Orthopnea [ ] ; Pedal Edema [ ] ; Palpitations [ ] ; Syncope [ ] ; Presyncope [ ] ; Paroxysmal nocturnal dyspnea [ ]     Pulmonary: Cough [ ] ; Wheezing [ ] ; Hemoptysis [ ] ; Sputum [ ] ; Snoring [ ]     GI: Vomiting [ ] ; Dysphagia [ ] ; Melena [ ] ; Hematochezia [ ] ; Heartburn [ ] ; Abdominal pain [ ] ; Constipation [ ] ; Diarrhea [ ] ; BRBPR [ ]     GU: Hematuria [ ] ; Dysuria [ ] ; Nocturia [ ]   Vascular: Pain in legs with walking [ ] ; Pain in feet with lying flat [ ] ; Non-healing sores [ ] ; Stroke [ ] ; TIA [ ] ; Slurred speech [ ] ;    Neuro: Headaches [ ] ; Vertigo [ ] ; Seizures [ ] ; Paresthesias [ ] ;Blurred vision [ ] ; Diplopia [ ] ; Vision changes [ ]     Ortho/Skin: Arthritis [ ] ; Joint pain [ ] ; Muscle pain [ ] ; Joint swelling [ ] ; Back Pain [ ] ; Rash [ ]     Psych: Depression [ ] ; Anxiety [ ]     Heme: Bleeding problems [ ] ; Clotting disorders [ ] ; Anemia [ ]     Endocrine: Diabetes [ ] ; Thyroid dysfunction [ ]    Physical Exam/Data:   Vitals:   07/07/20 1638 07/07/20 1645 07/07/20 1700 07/07/20 1717  BP: 93/66 (!) 87/63 91/63   Pulse: (!) 150 (!) 147 (!) 147   Resp: (!) 27 (!) 26 (!) 24   Temp:    99.2 F (37.3 C)  TempSrc:    Oral  SpO2: 97% 98% 97%   Weight:      Height:        Intake/Output Summary (Last 24 hours) at 07/07/2020 1718 Last data filed at 07/07/2020 1624 Gross per 24 hour  Intake 100 ml  Output --  Net 100 ml   Last 3 Weights 07/07/2020 05/08/2020 05/07/2020  Weight (lbs) 260 lb 269 lb 10 oz 260 lb  Weight (kg) 117.935 kg 122.3 kg 117.935  kg     Body mass index is 40.72 kg/m.  General:  Well nourished, well developed, in no acute distress HEENT: normal Lymph: no adenopathy Neck: no JVD Endocrine:  No thryomegaly Vascular: No carotid bruits; FA pulses 2+ bilaterally without bruits  Cardiac:  Regular rhythm, accelerated rate Lungs:  clear to auscultation bilaterally, no wheezing, rhonchi or rales  Abd: soft, nontender, no hepatomegaly  Ext: 3+ LE edema b/l from distal foot to thigh  Musculoskeletal:  No deformities, BUE and BLE strength normal and equal Skin: b/l LE erythema, L>R Neuro:  CNs 2-12 intact, no focal abnormalities noted Psych:  Normal affect   EKG:  The EKG (07/07/20, 15:15) was personally reviewed and demonstrates: 2:1 AFL, HR (152),  Telemetry:  Telemetry was personally reviewed and demonstrates: 2:1 AFL, rates 145-150  Relevant CV Studies:  TTE Result date: 05/08/20 1. Left ventricular ejection fraction, by estimation, is 60 to 65%. The  left ventricle has normal function. The left ventricle has no regional  wall motion abnormalities. There is mild concentric left ventricular  hypertrophy. Left ventricular diastolic  parameters are consistent with Grade II diastolic dysfunction  (pseudonormalization).  2. Right ventricular function is not well-visualized, but appears to be  mildly reduced. Right ventricular systolic function was not well  visualized. The right ventricular size is normal. Tricuspid regurgitation  signal is inadequate for assessing PA  pressure.  3. The mitral valve is normal in structure. No evidence of mitral valve  regurgitation. No evidence of mitral stenosis.  4. The aortic valve was not well visualized. Aortic valve regurgitation  is not visualized. No aortic stenosis is present.  5. Aortic dilatation noted. There is mild dilatation at the level of the  sinuses of Valsalva, measuring 39 mm.  6. The inferior vena cava is normal in size with greater than 50%   respiratory variability, suggesting right atrial pressure of 3 mmHg.   Laboratory Data:  High Sensitivity Troponin:   Recent Labs  Lab 07/07/20 1505  TROPONINIHS 14     Chemistry Recent Labs  Lab 07/07/20 1505  NA 137  K 4.2  CL 99  CO2 25  GLUCOSE 122*  BUN 15  CREATININE 0.94  CALCIUM 9.2  GFRNONAA >60  ANIONGAP 13    Recent Labs  Lab 07/07/20 1505  PROT 6.7  ALBUMIN 3.2*  AST 22  ALT 18  ALKPHOS 74  BILITOT 0.9   Hematology Recent Labs  Lab 07/07/20 1505  WBC 13.7*  RBC 3.73*  HGB 10.6*  HCT 36.5*  MCV 97.9  MCH 28.4  MCHC 29.0*  RDW 15.3  PLT 272   BNPNo results for input(s): BNP, PROBNP in the last 168 hours.  DDimer No results for input(s): DDIMER in the last 168 hours.  Radiology/Studies:  DG Chest Port 1 View  Result Date: 07/07/2020 CLINICAL DATA:  Questionable sepsis.  Evaluate for abnormality. EXAM: PORTABLE CHEST 1 VIEW COMPARISON:  May 07, 2020 FINDINGS: Stable cardiomegaly. Mild atelectasis in the left base. No pneumothorax. The lungs are clear. No other abnormalities. IMPRESSION: No active disease. Electronically Signed   By: Dorise Bullion III M.D   On: 07/07/2020 15:53   Assessment and Plan:   1. AFL/RVR Shane Diaz presents with acute onset shortness of breath and AFL/RVR with rates in the 150s.  He is currently largely asymptomatic resting in bed but his blood pressures are borderline hypotensive (80-100/60s) and he reports that his baseline blood pressures are 120s/70s.  He also had an elevated BNP and mild lactic acidosis on arrival.  Prior TTE does show diastolic dysfunction.  I do not think that he is presenting with primary ACS and it is unclear whether the AFL is a primary driver of his symptoms or a symptom of underlying infection.  He does have a mild leukocytosis (WBC 13.7) and is not currently on any steroids.  He was prescribed dexamethasone during his Covid pneumonia but is no longer taking this.  He has extensive history of  lower extremity DVTs and is on lifelong anticoagulation with Eliquis.  He does not keep track of his daily weight and on admission was 260 pounds. Prior clinic visits with weights between 280-298 lb. he has significant lower extremity 3+ pitting edema with overlying erythema with small weeping ulcers.  His daughter reports that his legs were reportedly much improved on discharge 2 months ago but that during the hospitalization they were also enlarged.  I am concerned that his atrial flutter with underlying diastolic dysfunction is leading to HFpEF exacerbation.  He does not seem to have much rhythm awareness but does note that his pulse is often in the 150s at home over the past few months. His uncontrolled OSA and obesity are certainly contributing to his AF/AFL. He is on broad-spectrum antibiotics for possible lower extremity cellulitis.  His left left lower extremity is warmer compared to his right lower extremity this in the setting of extensive prior DVTs. Fortunately his lactate is down trending despite  persistent AFL.  Diltiazem at 10 minutes/h does not seem to be affecting his rate is not surprising given that he has been AFL.  He has no allergies and is not on any rhythm medications.  He does not require urgent cardioversion however I will transition from diltiazem to amiodarone load and plan for cardioversion following IV amiodarone. - d/c diltiazem - start amiodarone load (amio bolus 150 mg over 90 minutes instead of 30 minutes given borderline BP), followed by 1 mg/min x6 h, 0.5 mg/min x18h then continue with PO load (400 mg PO tid) for 5 gm PO equivalent, TSH WNL (3.69 on 05/08/20), LFTs normal this admission  - repeat limited echo to assess function/assess for tachycardia induced CM prior to discharge  - will continue to follow   For questions or updates, please contact [facility] Please consult [email or web address] for contact info under   Signed, Dion Body, MD  07/07/2020 5:18 PM

## 2020-07-08 ENCOUNTER — Encounter (HOSPITAL_COMMUNITY): Payer: Self-pay | Admitting: Internal Medicine

## 2020-07-08 ENCOUNTER — Inpatient Hospital Stay (HOSPITAL_COMMUNITY): Payer: Medicare Other

## 2020-07-08 DIAGNOSIS — D649 Anemia, unspecified: Secondary | ICD-10-CM | POA: Diagnosis not present

## 2020-07-08 DIAGNOSIS — I5033 Acute on chronic diastolic (congestive) heart failure: Secondary | ICD-10-CM | POA: Diagnosis not present

## 2020-07-08 DIAGNOSIS — Z86010 Personal history of colonic polyps: Secondary | ICD-10-CM | POA: Diagnosis not present

## 2020-07-08 DIAGNOSIS — Z7901 Long term (current) use of anticoagulants: Secondary | ICD-10-CM

## 2020-07-08 DIAGNOSIS — R195 Other fecal abnormalities: Secondary | ICD-10-CM

## 2020-07-08 DIAGNOSIS — I4892 Unspecified atrial flutter: Secondary | ICD-10-CM | POA: Diagnosis not present

## 2020-07-08 LAB — BASIC METABOLIC PANEL
Anion gap: 12 (ref 5–15)
BUN: 15 mg/dL (ref 8–23)
CO2: 25 mmol/L (ref 22–32)
Calcium: 8.9 mg/dL (ref 8.9–10.3)
Chloride: 99 mmol/L (ref 98–111)
Creatinine, Ser: 0.92 mg/dL (ref 0.61–1.24)
GFR, Estimated: >60 mL/min (ref >60)
Glucose, Bld: 125 mg/dL — ABNORMAL HIGH (ref 70–99)
Potassium: 4 mmol/L (ref 3.5–5.1)
Sodium: 136 mmol/L (ref 135–145)

## 2020-07-08 LAB — APTT: aPTT: 39 seconds — ABNORMAL HIGH (ref 24–36)

## 2020-07-08 LAB — HEPARIN LEVEL (UNFRACTIONATED): Heparin Unfractionated: >2.2 IU/mL — ABNORMAL HIGH (ref 0.30–0.70)

## 2020-07-08 MED ORDER — FUROSEMIDE 10 MG/ML IJ SOLN
40.0000 mg | Freq: Two times a day (BID) | INTRAMUSCULAR | Status: DC
  Administered 2020-07-08 – 2020-07-14 (×11): 40 mg via INTRAVENOUS
  Filled 2020-07-08 (×12): qty 4

## 2020-07-08 MED ORDER — HEPARIN (PORCINE) 25000 UT/250ML-% IV SOLN
2000.0000 [IU]/h | INTRAVENOUS | Status: AC
  Administered 2020-07-08: 1300 [IU]/h via INTRAVENOUS
  Administered 2020-07-10: 2000 [IU]/h via INTRAVENOUS
  Administered 2020-07-10: 1750 [IU]/h via INTRAVENOUS
  Administered 2020-07-11: 2000 [IU]/h via INTRAVENOUS
  Filled 2020-07-08 (×4): qty 250

## 2020-07-08 NOTE — Progress Notes (Signed)
   07/08/20 1437  Assess: MEWS Score  Temp 99.2 F (37.3 C)  BP 116/84  Pulse Rate (!) 139  Resp 18  Level of Consciousness Alert  SpO2 100 %  O2 Device Nasal Cannula  O2 Flow Rate (L/min) 5 L/min  Assess: MEWS Score  MEWS Temp 0  MEWS Systolic 0  MEWS Pulse 3  MEWS RR 0  MEWS LOC 0  MEWS Score 3  MEWS Score Color Yellow  Assess: if the MEWS score is Yellow or Red  Were vital signs taken at a resting state? Yes  Focused Assessment No change from prior assessment  Early Detection of Sepsis Score *See Row Information* Low  MEWS guidelines implemented *See Row Information* No, previously yellow, continue vital signs every 4 hours  Treat  MEWS Interventions Administered scheduled meds/treatments  Pain Scale 0-10  Pain Score 0  Document  Progress note created (see row info) Yes   Patient continues to have elevated HR. Cardiology aware. Will continue amiodarone drip and assess for any increases in HR from current rate

## 2020-07-08 NOTE — Progress Notes (Signed)
Pt has refused cpap for the night.  RT will continue to monitor.

## 2020-07-08 NOTE — Progress Notes (Signed)
Medication administration, charting, and patient care documented by Student RN supervised by this RN

## 2020-07-08 NOTE — Progress Notes (Signed)
ANTICOAGULATION CONSULT NOTE - Initial Consult  Pharmacy Consult for Apixaban >> Heparin Indication: atrial fibrillation and DVT  No Known Allergies  Patient Measurements: Height: 5\' 7"  (170.2 cm) Weight: (!) 139.1 kg (306 lb 10.6 oz) IBW/kg (Calculated) : 66.1 Heparin Dosing Weight: 93 kg  Vital Signs: Temp: 99.2 F (37.3 C) (12/05 1437) Temp Source: Oral (12/05 1437) BP: 116/84 (12/05 1437) Pulse Rate: 139 (12/05 1437)  Labs: Recent Labs    07/07/20 1505 07/07/20 1832 07/08/20 0143  HGB 10.6*  --   --   HCT 36.5*  --   --   PLT 272  --   --   LABPROT 15.3*  --   --   INR 1.3*  --   --   CREATININE 0.94  --  0.92  TROPONINIHS 14 17  --     Estimated Creatinine Clearance: 95 mL/min (by C-G formula based on SCr of 0.92 mg/dL).   Medical History: Past Medical History:  Diagnosis Date  . Arthritis   . Diverticulosis 05/2015   Mild, noted on colonosocpy  . Fatty liver 05/14/2015   noted on [place] ABD  . History of colon polyps 05/2014   sessile in ascending colon, pedunculated in sigmoid colon, diverticulosis  . Hyperlipidemia    pt denies  . Hypertension    not currently taking medications per MD  . 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 Umbilical hernia   . Varicose veins     Medications:  Infusions:  . sodium chloride 250 mL (07/08/20 1514)  . amiodarone 30 mg/hr (07/08/20 1245)  . cefTRIAXone (ROCEPHIN)  IV 2 g (07/08/20 1522)  . vancomycin 750 mg (07/08/20 1640)    Assessment: 74 YOM on Apixaban PTA for hx Afib/DVT with a drop in Hgb and concern for GIB. GI consulted and planning upper/lower endoscopies on Wed, 12/8 - recommended transition from Apixaban to Heparin. Pharmacy consulted to dose.   The patient's last dose of Apixaban was earlier today around 1000 - will plan to start the Heparin drip ~12h after the last dose. Will aim for the lower goal range due to concern for GIB and monitor CBC closely.    Given recent Apixaban use -  will likely need to utilize aPTT for initial monitoring until correlated with heparin levels.   Goal of Therapy:  Heparin level 0.3-0.5 units/ml aPTT 66-85 seconds Monitor platelets by anticoagulation protocol: Yes   Plan:  - Start Heparin at 1300 units/hr (13 ml/hr) starting at 2200 this evening - Will obtain baseline HL/aPTT, daily levels until correlated - Will continue to monitor for any signs/symptoms of bleeding and will follow up with aPTT level in 8 hours   Thank you for allowing pharmacy to be a part of this patient's care.  Alycia Rossetti, PharmD, BCPS Clinical Pharmacist Clinical phone for 07/08/2020: [phone] 07/08/2020 6:50 PM   **Pharmacist phone directory can now be found on [email or web address] ([email or web address]).  Listed under [place].

## 2020-07-08 NOTE — Progress Notes (Signed)
Progress Note  Patient Name: Shane Diaz Date of Encounter: 07/08/2020  [place] Cardiologist: No primary care provider on file.   Subjective   Remains in atrial flutter, rate 140.  BP 104/71, SPO2 100%.  Lactate normalized with IV fluids.  Blood cultures no growth to date.  Creatinine 0.9 this morning  Inpatient Medications    Scheduled Meds: . apixaban  5 mg Oral BID  . gatifloxacin  1 drop Left Eye QID  . pantoprazole  40 mg Oral Daily  . sodium chloride flush  3 mL Intravenous Q12H  . triamcinolone  1 application Topical BID   Continuous Infusions: . sodium chloride Stopped (07/08/20 0502)  . amiodarone 30 mg/hr (07/08/20 0600)  . cefTRIAXone (ROCEPHIN)  IV    . vancomycin 150 mL/hr at 07/08/20 0600   PRN Meds: sodium chloride, acetaminophen, ondansetron (ZOFRAN) IV, sodium chloride flush   Vital Signs    Vitals:   07/07/20 2000 07/07/20 2106 07/08/20 0140 07/08/20 0422  BP: 98/72 111/73 106/71 104/71  Pulse: (!) 147 (!) 147 (!) 136 97  Resp: (!) 37 20 20 19   Temp: 98.6 F (37 C) 98.8 F (37.1 C) 98.6 F (37 C) 98 F (36.7 C)  TempSrc: Oral Oral Oral Oral  SpO2: 99% 99% 96% 100%  Weight:  (!) 139.5 kg  (!) 139.1 kg  Height:        Intake/Output Summary (Last 24 hours) at 07/08/2020 1111 Last data filed at 07/08/2020 0600 Gross per 24 hour  Intake 4785.87 ml  Output 450 ml  Net 4335.87 ml   Last 3 Weights 07/08/2020 07/07/2020 07/07/2020  Weight (lbs) 306 lb 10.6 oz 307 lb 9.6 oz 260 lb  Weight (kg) 139.1 kg 139.526 kg 117.935 kg      Telemetry    AFL 140s - Personally Reviewed  ECG    No new ECG - Personally Reviewed  Physical Exam   GEN: No acute distress.   Neck: No JVD appreciated but difficult to assess given body habitus Cardiac:  Tachycardic, regular, no murmurs Respiratory: Clear to auscultation bilaterally. GI: Soft, nontender, non-distended  MS: No edema; No deformity. Neuro:  Nonfocal  Psych: Normal affect   Labs     High Sensitivity Troponin:   Recent Labs  Lab 07/07/20 1505 07/07/20 1832  TROPONINIHS 14 17      Chemistry Recent Labs  Lab 07/07/20 1505 07/08/20 0143  NA 137 136  K 4.2 4.0  CL 99 99  CO2 25 25  GLUCOSE 122* 125*  BUN 15 15  CREATININE 0.94 0.92  CALCIUM 9.2 8.9  PROT 6.7  --   ALBUMIN 3.2*  --   AST 22  --   ALT 18  --   ALKPHOS 74  --   BILITOT 0.9  --   GFRNONAA >60 >60  ANIONGAP 13 12     Hematology Recent Labs  Lab 07/07/20 1505  WBC 13.7*  RBC 3.73*  HGB 10.6*  HCT 36.5*  MCV 97.9  MCH 28.4  MCHC 29.0*  RDW 15.3  PLT 272    BNP Recent Labs  Lab 07/07/20 1505  BNP 276.1*     DDimer No results for input(s): DDIMER in the last 168 hours.   Radiology    DG Chest Port 1 View  Result Date: 07/07/2020 CLINICAL DATA:  Questionable sepsis.  Evaluate for abnormality. EXAM: PORTABLE CHEST 1 VIEW COMPARISON:  May 07, 2020 FINDINGS: Stable cardiomegaly. Mild atelectasis in the left base. No  pneumothorax. The lungs are clear. No other abnormalities. IMPRESSION: No active disease. Electronically Signed   By: Dorise Bullion III M.D   On: 07/07/2020 15:53   US Abdomen Limited RUQ (LIVER/GB)  Result Date: 07/08/2020 CLINICAL DATA:  Fatty liver.  Morbid obesity. EXAM: ULTRASOUND ABDOMEN LIMITED RIGHT UPPER QUADRANT COMPARISON:  05/14/2015 FINDINGS: Gallbladder: Tiny gallstones evident measuring up to 10 mm. Gallbladder is incompletely distended with wall thickness measuring 7-8 mm. Common bile duct: Diameter: 4-5 mm Liver: Echogenic liver parenchyma suggests fatty deposition. Portal vein is patent on color Doppler imaging with normal direction of blood flow towards the liver. Other: None. IMPRESSION: 1. Cholelithiasis with gallbladder wall thickening. Gallbladder is not fully distended which could accentuate wall thickness. If clinical picture is equivocal for acute cholecystitis, nuclear scintigraphy may prove helpful to further evaluate. Electronically  Signed   By: Misty Stanley M.D.   On: 07/08/2020 09:50    Cardiac Studies    Patient Profile     74 y.o. 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    Assessment & Plan    AFL with RVR: He had a recent admission for Covid pneumonia last month and was found to have A. fib with RVR, treated with diltiazem.  Echo on 10/5 showed normal LVEF, grade 2 diastolic dysfunction, mild RV dysfunction, no significant valvular disease.  He was already on Eliquis for bilateral DVTs.  Now presents with acute shortness of breath and atrial flutter with rates 150s.  Appears asymptomatic but had soft pressures with diltiazem, was switched last night to amiodarone drip.   -Continue IV amiodarone.   -Currently on Eliquis 5 mg twice daily.  Notably, his hemoglobin is down 7g from prior check in October (17.8 >10.6).  He denies any bleeding, but notably fecal occult blood is positive.  Recommend GI evaluation, as will need to make sure he is not bleeding before cardioversion, as would commit to 4 weeks of uninterrupted anticoagulation  Acute on chronic diastolic heart failure: Weight is up 17 kg from 2 months ago.  Appears significantly volume overloaded on exam.  Had hypotension/elevated lactate on admission, and received IV fluids with improvement.  Lactate has normalized.  Recommend starting IV Lasix 40 mg twice daily  Cellulitis:  Had hypotension/elevated lactate on presentation, normalized with IV fluids.  On vancomycin and ceftriaxone per primary team    For questions or updates, please contact [place] Please consult [email or web address] for contact info under        Signed, Donato Heinz, MD  07/08/2020, 11:11 AM

## 2020-07-08 NOTE — Consult Note (Addendum)
[facility] Consult: 12:18 PM 07/08/2020  LOS: 1 day    Referring Provider: Dr Pietro Cassis  Primary Care Physician:  [name], MD Primary Gastroenterologist:  Dr. Oretha Caprice   Reason for Consultation: Anemia, FOBT positive in patient on Eliquis   HPI: Shane Diaz is a 74 y.o. male.  PMH morbid obesity.  Glucose intolerance.  OSA.  Bilateral LE DVTs.  Chronic Eliquis.  Distal femoropopliteal bypass 2015.  Chronic occlusive right tibioperoneal trunk DVT 2016. 05/2014 colonoscopy:  Average risk screening study.  2 polyps removed, sessile from the a sending, pedunculated from the sigmoid.  Mild left-sided diverticulosis.  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  Dr. Ardis Hughs recommended repeat colonoscopy 05/2017.   Recall letter sent 06/2017 but thus far patient has not had repeat colonoscopy.  10/4 -10/721 admitted for weakness.  Diagnosed with A. Fib/RVR, grade 2 diastolic dysfunction, normal LVEF.Marland Kitchen  Also had COVID-19 PNA. Treated with dexamethasone. Had mild elevation of LFTs and ultrasound at the time showed hepatic steatosis, 4 mm CBD no gallstones or GB wall thickening. However ultrasound reading mentions CBD, pancreas, IVC obscured by gas. Discharged on 2 L nasal cannula at home oxygen.  Eliquis continued at discharge.  In addition to Eliquis, the patient takes pantoprazole 40 mg daily.  Presented to the ED yesterday afternoon with complaint of dyspnea and associated chest discomfort.  Activity very limited consisting mostly of moving from the bed to the chair and very little walking within the house.  Increased lower extremity edema within the past month. Heart rate consistent with a flutter, RVR. On IV amiodarone, IV Lasix. Acute on chronic diastolic heart failure with rate increased  of 17 kg compared with October.   Despite volume overload, he was hypotensive at admission  FOBT positive. Lactic acid elevated Hgb 10.6, was 17.8 during Oct admission.  MCV 97, was 102 in Oct.  Platelets 272, up from 122 in Oct.   WBCs 13.7.  INR 1.3.   LFTs w low albumin, O/w normal LFTs.   Abdominal ultrasound shows cholelithiasis, GB wall thickening and incomplete GB distention. Fatty deposition in the liver. Patent PV with normal directional flow. CBD 4 to 5 mm.  Patient has brown, formed stools daily.  Has not seen melena or bleeding per rectum. No anorexia in fact he "eats like elephant".  No dysphagia.  Current breathing is not much improved from admission.  Heart rate, the sinus rhythm remains in the high 130s.  Eliquis is not on hold.  Lives with his wife.  Admits to drinking 2 beers daily in the summer and wine in the winter.    Past Medical History:  Diagnosis Date  . Arthritis   . Diverticulosis 05/2015   Mild, noted on colonosocpy  . Fatty liver 05/14/2015   noted on [place] ABD  . History of colon polyps    sessile in ascending colon, pedunculated in sigmoid colon  . Hyperlipidemia    pt denies  . Hypertension    not currently taking medications per MD  . Renal cyst  05/2014   Small, Left, noted on [place] ABD  . Umbilical hernia   . Varicose veins     Past Surgical History:  Procedure Laterality Date  . APPENDECTOMY     childhood  . COLONOSCOPY W/ POLYPECTOMY  05/23/2014  . UMBILICAL HERNIA REPAIR N/A 04/27/2018   Procedure: REPAIR UMBILICAL HERNIA ERAS PATHWAY;  Surgeon: Alphonsa Overall, MD;  Location: [facility];  Service: General;  Laterality: N/A;  . varicose veins    . WRIST SURGERY      Prior to Admission medications   Medication Sig Start Date End Date Taking? Authorizing Provider  apixaban (ELIQUIS) 5 MG TABS tablet Take 1 tablet (5 mg total) by mouth 2 (two) times daily. 06/01/19  Yes [name], [name], MD  dexamethasone (DECADRON) 6 MG tablet Take 1 tablet (6 mg  total) by mouth daily. 05/10/20  Yes Elgergawy, Silver Huguenin, MD  gatifloxacin (ZYMAXID) 0.5 % SOLN Place 1 drop into the left eye 4 (four) times daily. 04/03/20  Yes [provider]  metoprolol tartrate (LOPRESSOR) 25 MG tablet Take 1 tablet (25 mg total) by mouth 2 (two) times daily. 05/10/20  Yes Elgergawy, Silver Huguenin, MD  pantoprazole (PROTONIX) 40 MG tablet Take 1 tablet (40 mg total) by mouth daily. 05/10/20  Yes Elgergawy, Silver Huguenin, MD    Scheduled Meds: . apixaban  5 mg Oral BID  . furosemide  40 mg Intravenous BID  . gatifloxacin  1 drop Left Eye QID  . pantoprazole  40 mg Oral Daily  . sodium chloride flush  3 mL Intravenous Q12H  . triamcinolone  1 application Topical BID   Infusions: . sodium chloride Stopped (07/08/20 0502)  . amiodarone 30 mg/hr (07/08/20 0600)  . cefTRIAXone (ROCEPHIN)  IV    . vancomycin 150 mL/hr at 07/08/20 0600   PRN Meds: sodium chloride, acetaminophen, ondansetron (ZOFRAN) IV, sodium chloride flush   Allergies as of 07/07/2020  . (No Known Allergies)    Family History  Problem Relation Age of Onset  . Colon cancer Neg Hx   . Esophageal cancer Neg Hx   . Pancreatic cancer Neg Hx   . Prostate cancer Neg Hx   . Rectal cancer Neg Hx   . Stomach cancer Neg Hx     Social History   Socioeconomic History  . Marital status: Married    Spouse name: Not on file  . Number of children: Not on file  . Years of education: Not on file  . Highest education level: Not on file  Occupational History  . Not on file  Tobacco Use  . Smoking status: Never Smoker  . Smokeless tobacco: Never Used  Vaping Use  . Vaping Use: Never used  Substance and Sexual Activity  . Alcohol use: Yes    Alcohol/week: 0.0 standard drinks    Comment: occassional wine, martini  . Drug use: No  . Sexual activity: Not on file  Other Topics Concern  . Not on file  Social History Narrative  . Not on file   Social Determinants of Health   Financial Resource Strain:    . Difficulty of Paying Living Expenses: Not on file  Food Insecurity:   . Worried About Charity fundraiser in the Last Year: Not on file  . Ran Out of Food in the Last Year: Not on file  Transportation Needs:   . Lack of Transportation (Medical): Not on file  . Lack of Transportation (Non-Medical): Not on file  Physical Activity:   .  Days of Exercise per Week: Not on file  . Minutes of Exercise per Session: Not on file  Stress:   . Feeling of Stress : Not on file  Social Connections:   . Frequency of Communication with Friends and Family: Not on file  . Frequency of Social Gatherings with Friends and Family: Not on file  . Attends Religious Services: Not on file  . Active Member of Clubs or Organizations: Not on file  . Attends Archivist Meetings: Not on file  . Marital Status: Not on file  Intimate Partner Violence:   . Fear of Current or Ex-Partner: Not on file  . Emotionally Abused: Not on file  . Physically Abused: Not on file  . Sexually Abused: Not on file    REVIEW OF SYSTEMS: Constitutional: Weakness. ENT:  No nose bleeds Pulm: DOE.  No significant cough CV: Per HPI.  Patient has chronic lower extremity edema, with weeping.  His wife wraps his legs for him.  This is painless. GU:  No hematuria, no frequency GI: See HPI Heme: Denies unusual or significant bleeding or bruising but develops purpura quite easily. Transfusions: None, no records of blood product transfusions in epic. Neuro:  No headaches, no peripheral tingling or numbness Derm:  No itching, no rash or sores.  Endocrine:  No sweats or chills.  No polyuria or dysuria Immunization: Was vaccinated with Pfizer COVID-19 vaccine in May 2021 but again subsequently hospitalized with Covid pneumonia in October.   PHYSICAL EXAM: Vital signs in last 24 hours: Vitals:   07/08/20 0140 07/08/20 0422  BP: 106/71 104/71  Pulse: (!) 136 97  Resp: 20 19  Temp: 98.6 F (37 C) 98 F (36.7 C)  SpO2: 96%  100%   Wt Readings from Last 3 Encounters:  07/08/20 (!) 139.1 kg  05/08/20 122.3 kg  10/13/19 127.7 kg    General: Morbidly obese, unwell, alert, comfortable Head: No facial asymmetry or swelling.  No signs of head trauma. Eyes: No scleral icterus or conjunctival pallor.  EOMI Ears: Not hard of hearing Nose: No congestion or discharge Mouth: Oropharynx moist, pink, clear.  Tongue midline Neck: Obese, no JVD appreciated.  No masses or thyromegaly. Lungs: Although breath sounds overall diminished, clear bilaterally.  No cough.  No dyspnea at rest Heart: NSR, rate 138 on telemetry monitor.  Regular rhythm, no MRG. Abdomen: Obese, soft, nontender.  No HSM, bruits, hernias, masses appreciated.  Bowel sounds active.   Rectal: Deferred Musc/Skeltl: No gross joint deformities, redness or swelling. Extremities: Massive bil lower extremity edema moving up into the thighs, with erythema.  Gauze bandage wraps from the feet up to the knees and this is clean and dry. Neurologic: Alert.  Oriented x3.  Moves all 4 limbs without tremor.  Strength not tested. Skin: Stasis dermatitis changes on the lower legs. Nodes: No cervical adenopathy Psych: Cooperative, calm, pleasant.  Intake/Output from previous day: 12/04 0701 - 12/05 0700 In: 4785.9 [P.O.:240; I.V.:230; IV Piggyback:4315.9] Out: 450 [Urine:450] Intake/Output this shift: No intake/output data recorded.  LAB RESULTS: Recent Labs    07/07/20 1505  WBC 13.7*  HGB 10.6*  HCT 36.5*  PLT 272   BMET Lab Results  Component Value Date   NA 136 07/08/2020   NA 137 07/07/2020   NA 136 05/10/2020   K 4.0 07/08/2020   K 4.2 07/07/2020   K 4.8 05/10/2020   CL 99 07/08/2020   CL 99 07/07/2020   CL 100 05/10/2020   CO2  25 07/08/2020   CO2 25 07/07/2020   CO2 28 05/10/2020   GLUCOSE 125 (H) 07/08/2020   GLUCOSE 122 (H) 07/07/2020   GLUCOSE 146 (H) 05/10/2020   BUN 15 07/08/2020   BUN 15 07/07/2020   BUN 31 (H) 05/10/2020    CREATININE 0.92 07/08/2020   CREATININE 0.94 07/07/2020   CREATININE 1.06 05/10/2020   CALCIUM 8.9 07/08/2020   CALCIUM 9.2 07/07/2020   CALCIUM 8.9 05/10/2020   LFT Recent Labs    07/07/20 1505  PROT 6.7  ALBUMIN 3.2*  AST 22  ALT 18  ALKPHOS 74  BILITOT 0.9   PT/INR Lab Results  Component Value Date   INR 1.3 (H) 07/07/2020   Hepatitis Panel No results for input(s): HEPBSAG, HCVAB, HEPAIGM, HEPBIGM in the last 72 hours. C-Diff No components found for: CDIFF Lipase  No results found for: LIPASE  Drugs of Abuse  No results found for: LABOPIA, COCAINSCRNUR, LABBENZ, AMPHETMU, THCU, LABBARB   RADIOLOGY STUDIES: DG Chest Port 1 View  Result Date: 07/07/2020 CLINICAL DATA:  Questionable sepsis.  Evaluate for abnormality. EXAM: PORTABLE CHEST 1 VIEW COMPARISON:  May 07, 2020 FINDINGS: Stable cardiomegaly. Mild atelectasis in the left base. No pneumothorax. The lungs are clear. No other abnormalities. IMPRESSION: No active disease. Electronically Signed   By: Dorise Bullion III M.D   On: 07/07/2020 15:53   US Abdomen Limited RUQ (LIVER/GB)  Result Date: 07/08/2020 CLINICAL DATA:  Fatty liver.  Morbid obesity. EXAM: ULTRASOUND ABDOMEN LIMITED RIGHT UPPER QUADRANT COMPARISON:  05/14/2015 FINDINGS: Gallbladder: Tiny gallstones evident measuring up to 10 mm. Gallbladder is incompletely distended with wall thickness measuring 7-8 mm. Common bile duct: Diameter: 4-5 mm Liver: Echogenic liver parenchyma suggests fatty deposition. Portal vein is patent on color Doppler imaging with normal direction of blood flow towards the liver. Other: None. IMPRESSION: 1. Cholelithiasis with gallbladder wall thickening. Gallbladder is not fully distended which could accentuate wall thickness. If clinical picture is equivocal for acute cholecystitis, nuclear scintigraphy may prove helpful to further evaluate. Electronically Signed   By: Misty Stanley M.D.   On: 07/08/2020 09:50     IMPRESSION:    *     Normocytic anemia.  Hgb drop of 7 g, 17.8 >> 10.6 over 2 months.  Granted he was polycythemic in October.  FOBT positive but no overt bleeding. 2015 colonoscopy with TA and TVA polyps.  Pt admitted to disregarding colonoscopy callback letter 05/2017.  *    A flutter, RVR.  In setting of volume overload, acute diastolic heart failure.  *     Chronic Eliquis for DVTs, PVD.  This is not on hold.  *     Fatty liver.  Currently normal transaminases but were as high as 139/60 on 10/4.  Patient admits to drinking 2 beers daily.  *     Bilateral lower extremity swelling, cellulitis.  Rocephin, vancomycin in place.   PLAN:     *   Needs colonoscopy and possible upper endoscopy but hold these until the cardiac arrhythmia has settled down and he can come off Eliquis for a couple of days.  *   Dr. Donneta Romberg suggestion I placed orders for a Wednesday colonoscopy/EGD.  However he may not be ready for these procedures by then   Azucena Freed  07/08/2020, 12:18 PM Phone [phone]

## 2020-07-08 NOTE — Procedures (Signed)
Heart rate too high for accurate echo at this time. 

## 2020-07-08 NOTE — Progress Notes (Signed)
PROGRESS NOTE  Shane Diaz  DOB: August 10, 1945  PCP: [name], MD GXQ:[id number]  DOA: 07/07/2020  LOS: 1 day   Chief Complaint  Patient presents with  . Chest Pain  . Shortness of Breath  . Leg Swelling   Brief narrative: Shane Diaz is a 74 y.o. 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 On 07/07/2020, patient presented to the ED with complaint of acute onset left-sided chest pain and shortness of breath.  Over the past few months, patient has noticed that his heart rate is often in the 130s to 150s range, otherwise asymptomatic. He was recently admitted (10/4-10/7) for generalized weakness, found to be in A. fib with RVR, responded to Cardizem drip and was discharged home on low-dose beta-blocker.  During that hospitalization, he was also found to have Covid pneumonia, received standard treatment and was discharged on 2 L oxygen. Patient lives at home with his wife, and is fairly sedentary in the house.  In the ED, patient had a temperature, tachycardic to 150s, tachypneic in 20s, required 3 L oxygen by nasal cannula. EKG showed A. fib with RVR.  Patient was started on Cardizem drip. He also had acute worsening chronic bilateral lower extremity edema. Initial lactic acid level was elevated to 2.8 subsequently down to 1.4 after IV fluid. Patient was admitted to hospitalist service.  Cardiology consult was called.  Subjective: Patient was seen and examined this morning. Pleasant elderly male.  Not in distress.  On low-flow oxygen by nasal cannula. Chart reviewed.  No recurrence of fever heart rate 97 this morning, blood pressure low normal FOBT positive  Assessment/Plan: Sepsis secondary to bilateral lower extremity cellulitis -Patient had acute worsening of chronic lower extremity swelling. -Labs with WBC count elevated to 13.7 and lactic acid elevated to 2.5. -Started on  broad-spectrum antibiotics with IV Rocephin and IV vancomycin. -Lactic acid improved with IV fluid. -Patient does not have a septic lab this morning. -Continue to monitor WBC count Recent Labs  Lab 07/07/20 1505 07/07/20 1832  WBC 13.7*  --   LATICACIDVEN 2.5* 1.4   A. fib with RVR -Presented with shortness of breath RVR to 150s.   -Initially started on IV Cardizem drip.  Cardiology switched to amiodarone drip.   -Continue monitor in telemetry. -Currently on Eliquis.  Acute exacerbation of chronic diastolic CHF -Has significant bilateral pedal edema. -Heart failure probably precipitated by RVR. -Pending echocardiogram. IV Fluid was stopped because of significant pedal edema. -Patient may benefit from IV Lasix.  Discussed with cardiology. Recent Labs    05/07/20 2230 07/07/20 1505  BNP 273.2* 276.1*   Acute anemia -Noted a drop in hemoglobin from 17 to 10.6 in a span of 2 months.  On Eliquis.  FOBT positive. -After discussing with cardiologist Dr. Gardiner Rhyme this morning.  A GI evaluation with EGD/colonoscopy may be required before patient can go for cardioversion and uninterrupted anticoagulation after that.  Morbid obesity - Body mass index is 48.03 kg/m. Patient has been advised to make an attempt to improve diet and exercise patterns to aid in weight loss.  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  Significant oozing at the time of dressing change per RN. -Expected to improve with IV Lasix, leg elevation.  Mobility: Limited mobility  in the house.  PT eval ordered. Code Status:   Code Status: DNR  Nutritional status: Body mass index is 48.03 kg/m.     Diet Order            Diet Heart Room service appropriate? Yes; Fluid consistency: Thin; Fluid restriction: 1800 mL Fluid   Diet effective now                 DVT prophylaxis:  apixaban (ELIQUIS) tablet 5 mg   Antimicrobials:  IV ceftriaxone IV vancomycin Fluid: No need of IV fluid Consultants: Cardiology, GI Family Communication:  None at bedside  Status is: Inpatient  Remains inpatient appropriate because -needs GI evaluation, possible EGD/colonoscopy and cardioversion  Dispo: The patient is from: Home              Anticipated d/c is to: Home likely              Anticipated d/c date is: > 3 days              Patient currently is not medically stable to d/c.       Infusions:  . sodium chloride Stopped (07/08/20 0502)  . amiodarone 30 mg/hr (07/08/20 0600)  . cefTRIAXone (ROCEPHIN)  IV    . vancomycin 150 mL/hr at 07/08/20 0600    Scheduled Meds: . apixaban  5 mg Oral BID  . gatifloxacin  1 drop Left Eye QID  . pantoprazole  40 mg Oral Daily  . sodium chloride flush  3 mL Intravenous Q12H  . triamcinolone  1 application Topical BID    Antimicrobials: Anti-infectives (From admission, onward)   Start     Dose/Rate Route Frequency Ordered Stop   07/08/20 1600  cefTRIAXone (ROCEPHIN) 2 g in sodium chloride 0.9 % 100 mL IVPB        2 g 200 mL/hr over 30 Minutes Intravenous Every 24 hours 07/07/20 1931     07/08/20 0430  vancomycin (VANCOREADY) IVPB 750 mg/150 mL        750 mg 150 mL/hr over 60 Minutes Intravenous Every 12 hours 07/07/20 1632     07/07/20 1545  vancomycin (VANCOCIN) IVPB 1000 mg/200 mL premix  Status:  Discontinued        1,000 mg 200 mL/hr over 60 Minutes Intravenous  Once 07/07/20 1531 07/07/20 1538   07/07/20 1545  cefTRIAXone (ROCEPHIN) 2 g in sodium chloride 0.9 % 100 mL IVPB        2 g 200 mL/hr over 30 Minutes Intravenous  Once 07/07/20 1531 07/07/20 1624   07/07/20 1545  vancomycin (VANCOREADY) IVPB 2000 mg/400 mL        2,000 mg 200 mL/hr over 120 Minutes Intravenous  Once 07/07/20 1538 07/07/20 2002      PRN meds: sodium chloride, acetaminophen,  ondansetron (ZOFRAN) IV, sodium chloride flush   Objective: Vitals:   07/08/20 0140 07/08/20 0422  BP: 106/71 104/71  Pulse: (!) 136 97  Resp: 20 19  Temp: 98.6 F (37 C) 98 F (36.7 C)  SpO2: 96% 100%    Intake/Output Summary (Last 24 hours) at 07/08/2020 0814 Last data filed at 07/08/2020 0600 Gross per 24 hour  Intake 4785.87 ml  Output 450 ml  Net 4335.87 ml   Filed Weights   07/07/20 1502 07/07/20 2106 07/08/20 0422  Weight: 117.9 kg (!) 139.5 kg (!) 139.1 kg   Weight change:  Body mass index is 48.03 kg/m.   Physical Exam: General exam: Pleasant elderly  Caucasian male.  Not in distress Skin: No rashes, lesions or ulcers. HEENT: Atraumatic, normocephalic, no obvious bleeding Lungs: Clear to auscultation bilaterally CVS: Regular tachycardia, no murmur GI/Abd soft, nontender, distended from obesity, bowel sound present CNS: Alert, awake, oriented x3 Psychiatry: Mood appropriate Extremities: 2-3+ pedal edema bilaterally  Data Review: I have personally reviewed the laboratory data and studies available.  Recent Labs  Lab 07/07/20 1505  WBC 13.7*  NEUTROABS 9.6*  HGB 10.6*  HCT 36.5*  MCV 97.9  PLT 272   Recent Labs  Lab 07/07/20 1505 07/08/20 0143  NA 137 136  K 4.2 4.0  CL 99 99  CO2 25 25  GLUCOSE 122* 125*  BUN 15 15  CREATININE 0.94 0.92  CALCIUM 9.2 8.9    F/u labs ordered.  Signed, Terrilee Croak, MD [facility] 07/08/2020

## 2020-07-09 ENCOUNTER — Other Ambulatory Visit (HOSPITAL_COMMUNITY): Payer: Medicare Other

## 2020-07-09 DIAGNOSIS — I4819 Other persistent atrial fibrillation: Secondary | ICD-10-CM

## 2020-07-09 LAB — MAGNESIUM: Magnesium: 1.6 mg/dL — ABNORMAL LOW (ref 1.7–2.4)

## 2020-07-09 LAB — APTT
aPTT: 54 seconds — ABNORMAL HIGH (ref 24–36)
aPTT: 58 seconds — ABNORMAL HIGH (ref 24–36)

## 2020-07-09 LAB — CBC WITH DIFFERENTIAL/PLATELET
Abs Immature Granulocytes: 0.04 10*3/uL (ref 0.00–0.07)
Basophils Absolute: 0 10*3/uL (ref 0.0–0.1)
Basophils Relative: 0 %
Eosinophils Absolute: 0.2 10*3/uL (ref 0.0–0.5)
Eosinophils Relative: 2 %
HCT: 31.4 % — ABNORMAL LOW (ref 39.0–52.0)
Hemoglobin: 9.5 g/dL — ABNORMAL LOW (ref 13.0–17.0)
Immature Granulocytes: 1 %
Lymphocytes Relative: 21 %
Lymphs Abs: 1.6 10*3/uL (ref 0.7–4.0)
MCH: 28.5 pg (ref 26.0–34.0)
MCHC: 30.3 g/dL (ref 30.0–36.0)
MCV: 94.3 fL (ref 80.0–100.0)
Monocytes Absolute: 0.8 10*3/uL (ref 0.1–1.0)
Monocytes Relative: 11 %
Neutro Abs: 4.9 10*3/uL (ref 1.7–7.7)
Neutrophils Relative %: 65 %
Platelets: 207 10*3/uL (ref 150–400)
RBC: 3.33 MIL/uL — ABNORMAL LOW (ref 4.22–5.81)
RDW: 15.3 % (ref 11.5–15.5)
WBC: 7.5 10*3/uL (ref 4.0–10.5)
nRBC: 0 % (ref 0.0–0.2)

## 2020-07-09 LAB — BASIC METABOLIC PANEL
Anion gap: 10 (ref 5–15)
BUN: 17 mg/dL (ref 8–23)
CO2: 29 mmol/L (ref 22–32)
Calcium: 8.8 mg/dL — ABNORMAL LOW (ref 8.9–10.3)
Chloride: 97 mmol/L — ABNORMAL LOW (ref 98–111)
Creatinine, Ser: 1.1 mg/dL (ref 0.61–1.24)
GFR, Estimated: >60 mL/min (ref >60)
Glucose, Bld: 100 mg/dL — ABNORMAL HIGH (ref 70–99)
Potassium: 3.2 mmol/L — ABNORMAL LOW (ref 3.5–5.1)
Sodium: 136 mmol/L (ref 135–145)

## 2020-07-09 LAB — IRON AND TIBC
Iron: 19 ug/dL — ABNORMAL LOW (ref 45–182)
Saturation Ratios: 5 % — ABNORMAL LOW (ref 17.9–39.5)
TIBC: 393 ug/dL (ref 250–450)
UIBC: 374 ug/dL

## 2020-07-09 LAB — ANTISTREPTOLYSIN O TITER: ASO: 43 IU/mL (ref 0.0–200.0)

## 2020-07-09 LAB — VITAMIN B12: Vitamin B-12: 157 pg/mL — ABNORMAL LOW (ref 180–914)

## 2020-07-09 LAB — HEPARIN LEVEL (UNFRACTIONATED): Heparin Unfractionated: >2.2 IU/mL — ABNORMAL HIGH (ref 0.30–0.70)

## 2020-07-09 LAB — PHOSPHORUS: Phosphorus: 3.2 mg/dL (ref 2.5–4.6)

## 2020-07-09 LAB — FERRITIN: Ferritin: 40 ng/mL (ref 24–336)

## 2020-07-09 LAB — FOLATE: Folate: 9.8 ng/mL (ref >5.9)

## 2020-07-09 MED ORDER — VITAMIN B-12 1000 MCG PO TABS
1000.0000 ug | ORAL_TABLET | Freq: Every day | ORAL | Status: DC
  Administered 2020-07-09 – 2020-07-14 (×6): 1000 ug via ORAL
  Filled 2020-07-09 (×6): qty 1

## 2020-07-09 MED ORDER — MAGNESIUM OXIDE 400 (241.3 MG) MG PO TABS
400.0000 mg | ORAL_TABLET | Freq: Every day | ORAL | Status: DC
  Administered 2020-07-10 – 2020-07-14 (×5): 400 mg via ORAL
  Filled 2020-07-09 (×6): qty 1

## 2020-07-09 MED ORDER — MAGNESIUM SULFATE 2 GM/50ML IV SOLN
2.0000 g | Freq: Once | INTRAVENOUS | Status: AC
  Administered 2020-07-09: 2 g via INTRAVENOUS
  Filled 2020-07-09: qty 50

## 2020-07-09 MED ORDER — POTASSIUM CHLORIDE CRYS ER 20 MEQ PO TBCR
40.0000 meq | EXTENDED_RELEASE_TABLET | Freq: Two times a day (BID) | ORAL | Status: DC
  Administered 2020-07-09 – 2020-07-14 (×11): 40 meq via ORAL
  Filled 2020-07-09 (×11): qty 2

## 2020-07-09 NOTE — Consult Note (Signed)
Orient Nurse Consult Note: Reason for Consult:Acute CHF exacerbation with increasing lower extremity edema/weeping. Now with bilateral lower extremity cellulitis.  Wound type:infectious/inflamatory Pressure Injury POA: NA Measurement: Edema and erythema to lower legs.  Weeping serous fluid noted.  Wound FQH:KUVJDY weeping skin Drainage (amount, consistency, odor) moderate serous weeping Periwound:NA Dressing procedure/placement/frequency:BEdside RN to perform: Cleanse legs with soap and water and pat dry.  Apply Xeroform LAWSON # [id number] to anterior and posterior lower leg Cover with ABD pad and secure with kerlix and ace wrap. Change daily.  Will not follow at this time.  Please re-consult if needed.  Domenic Moras MSN, RN, FNP-BC CWON Wound, Ostomy, Continence Nurse Pager [phone]

## 2020-07-09 NOTE — Progress Notes (Signed)
Pt does not want cpap at this time

## 2020-07-09 NOTE — Progress Notes (Signed)
Progress Note  Patient Name: Shane Diaz Date of Encounter: 07/09/2020  [facility] Cardiologist: Donato Heinz, MD   Subjective   74 year old gentleman with a history of obstructive sleep apnea, prediabetes morbid obesity.  He is admitted with worsening shortness of breath and was found to have atrial  flutter.  He has been found to have an acute drop in his hemoglobin from 17 down to 10 g. He is heme-positive. He has been seen by gastroenterology and is scheduled for upper and lower endoscopy. Our plan is to make sure that he is evaluated for possible GI bleeding before we commit him to cardioversion and 3 to 4 weeks of anticoagulation.    He does not want to have the endoscopy ( ? Due to costs) He will discuss with his wife.  I've explained the importance of getting this sorted out prior to committing him to 3-4 weeks of continued DOAC in the setting of a fairly brisk drop in Hb.   Inpatient Medications    Scheduled Meds: . furosemide  40 mg Intravenous BID  . gatifloxacin  1 drop Left Eye QID  . pantoprazole  40 mg Oral Daily  . sodium chloride flush  3 mL Intravenous Q12H  . triamcinolone  1 application Topical BID  . vitamin B-12  1,000 mcg Oral Daily   Continuous Infusions: . sodium chloride 250 mL (07/08/20 1514)  . amiodarone 30 mg/hr (07/09/20 0056)  . cefTRIAXone (ROCEPHIN)  IV 2 g (07/08/20 1522)  . heparin 1,300 Units/hr (07/08/20 2240)  . vancomycin 750 mg (07/09/20 0526)   PRN Meds: sodium chloride, acetaminophen, ondansetron (ZOFRAN) IV, sodium chloride flush   Vital Signs    Vitals:   07/08/20 1437 07/08/20 2011 07/08/20 2353 07/09/20 0532  BP: 116/84 (!) 138/94 112/73 123/85  Pulse: (!) 139 (!) 139  (!) 135  Resp: 18 20 20 20   Temp: 99.2 F (37.3 C) 99.2 F (37.3 C) 98 F (36.7 C) 98.1 F (36.7 C)  TempSrc: Oral Oral Oral Oral  SpO2: 100% 100% 96% 99%  Weight:    (!) 138.9 kg  Height:        Intake/Output Summary (Last 24  hours) at 07/09/2020 0932 Last data filed at 07/09/2020 0400 Gross per 24 hour  Intake 1346.32 ml  Output 1525 ml  Net -178.68 ml   Last 3 Weights 07/09/2020 07/08/2020 07/07/2020  Weight (lbs) 306 lb 3.5 oz 306 lb 10.6 oz 307 lb 9.6 oz  Weight (kg) 138.9 kg 139.1 kg 139.526 kg      Telemetry    Atrial fib with RVR - Personally Reviewed  ECG    Atrial fib with RVR  - Personally Reviewed  Physical Exam   GEN: No acute distress.  Elderly male,  NAD , moderately obese.  Neck: No JVD Cardiac: Irreg. Irreg.     Respiratory: Clear to auscultation bilaterally. GI: Soft, nontender, non-distended  MS: No edema; No deformity. Neuro:  Nonfocal  Psych: Normal affect   Labs    High Sensitivity Troponin:   Recent Labs  Lab 07/07/20 1505 07/07/20 1832  TROPONINIHS 14 17      Chemistry Recent Labs  Lab 07/07/20 1505 07/08/20 0143 07/09/20 0553  NA 137 136 136  K 4.2 4.0 3.2*  CL 99 99 97*  CO2 25 25 29   GLUCOSE 122* 125* 100*  BUN 15 15 17   CREATININE 0.94 0.92 1.10  CALCIUM 9.2 8.9 8.8*  PROT 6.7  --   --  ALBUMIN 3.2*  --   --   AST 22  --   --   ALT 18  --   --   ALKPHOS 74  --   --   BILITOT 0.9  --   --   GFRNONAA >60 >60 >60  ANIONGAP 13 12 10      Hematology Recent Labs  Lab 07/07/20 1505 07/09/20 0553  WBC 13.7* 7.5  RBC 3.73* 3.33*  HGB 10.6* 9.5*  HCT 36.5* 31.4*  MCV 97.9 94.3  MCH 28.4 28.5  MCHC 29.0* 30.3  RDW 15.3 15.3  PLT 272 207    BNP Recent Labs  Lab 07/07/20 1505  BNP 276.1*     DDimer No results for input(s): DDIMER in the last 168 hours.   Radiology    DG Chest Port 1 View  Result Date: 07/07/2020 CLINICAL DATA:  Questionable sepsis.  Evaluate for abnormality. EXAM: PORTABLE CHEST 1 VIEW COMPARISON:  May 07, 2020 FINDINGS: Stable cardiomegaly. Mild atelectasis in the left base. No pneumothorax. The lungs are clear. No other abnormalities. IMPRESSION: No active disease. Electronically Signed   By: Dorise Bullion III  M.D   On: 07/07/2020 15:53   US Abdomen Limited RUQ (LIVER/GB)  Result Date: 07/08/2020 CLINICAL DATA:  Fatty liver.  Morbid obesity. EXAM: ULTRASOUND ABDOMEN LIMITED RIGHT UPPER QUADRANT COMPARISON:  05/14/2015 FINDINGS: Gallbladder: Tiny gallstones evident measuring up to 10 mm. Gallbladder is incompletely distended with wall thickness measuring 7-8 mm. Common bile duct: Diameter: 4-5 mm Liver: Echogenic liver parenchyma suggests fatty deposition. Portal vein is patent on color Doppler imaging with normal direction of blood flow towards the liver. Other: None. IMPRESSION: 1. Cholelithiasis with gallbladder wall thickening. Gallbladder is not fully distended which could accentuate wall thickness. If clinical picture is equivocal for acute cholecystitis, nuclear scintigraphy may prove helpful to further evaluate. Electronically Signed   By: Misty Stanley M.D.   On: 07/08/2020 09:50    Cardiac Studies     Patient Profile     74 y.o. male admitted with dyspnea and an abrupt fall in his Hb. He was also found to have atrial fib  Assessment & Plan    1.  Shortness of breath: His shortness of breath is likely multifactorial.  He has had an abrupt fall in his hemoglobin from 17.8 in October down to 10.62 months later.  He also has developed A. fib with rapid ventricular response.  At present he does not want to have the endoscopy procedures.  I explained the importance of getting the work-up completed so that we can safely do a cardioversion.  He will discuss these issues with his wife.  2.  Atrial fibrillation with rapid ventricular response: He is currently off his DOAC.  Continue heparin.  He is currently on amiodarone drip.  3.  Acute GI bleed: He dropped his hemoglobin 7 g over the course of 2 months.  He is heme-positive.  I completely agree with GI work-up prior to attempting cardioversion.      For questions or updates, please contact [place] Please consult [email or web address] for  contact info under        Signed, Mertie Moores, MD  07/09/2020, 9:32 AM

## 2020-07-09 NOTE — Progress Notes (Signed)
PROGRESS NOTE  Shane Diaz  DOB: [birth year]  PCP: [name], MD EPP:[id number]  DOA: 07/07/2020  LOS: 2 days   Chief Complaint  Patient presents with  . Chest Pain  . Shortness of Breath  . Leg Swelling   Brief narrative: Shane Diaz is a 74 y.o. 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 On 07/07/2020, patient presented to the ED with complaint of acute onset left-sided chest pain and shortness of breath.  Over the past few months, patient has noticed that his heart rate is often in the 130s to 150s range, otherwise asymptomatic. He was recently admitted (10/4-10/7) for generalized weakness, found to be in A. fib with RVR, responded to Cardizem drip and was discharged home on low-dose beta-blocker.  During that hospitalization, he was also found to have Covid pneumonia, received standard treatment and was discharged on 2 L oxygen. Patient lives at home with his wife, and is fairly sedentary in the house.  In the ED, patient had a temperature, tachycardic to 150s, tachypneic in 20s, required 3 L oxygen by nasal cannula. EKG showed A. fib with RVR.  Patient was started on Cardizem drip. He also had acute worsening chronic bilateral lower extremity edema. Initial lactic acid level was elevated to 2.8 subsequently down to 1.4 after IV fluid. Patient was admitted to hospitalist service.   Cardiology and GI service following.  Subjective: Patient was seen and examined this morning. Sitting up at the edge of the bed.  Not in distress.  He has continued oozing of serous fluid from both his legs.  GI recommended endoscopy which patient does not seem to be interested in going forward with.  He says he will discuss this with his wife. Currently on amiodarone drip.  Assessment/Plan: Sepsis secondary to bilateral lower extremity cellulitis -Patient had acute worsening of chronic lower  extremity swelling. -Labs with WBC count elevated to 13.7 and lactic acid elevated to 2.5. -Started on broad-spectrum antibiotics with IV Rocephin and IV vancomycin. -Sepsis parameters improved. -Continue broad-spectrum antibiotics  Recent Labs  Lab 07/07/20 1505 07/07/20 1832 07/09/20 0553  WBC 13.7*  --  7.5  LATICACIDVEN 2.5* 1.4  --    A. fib with RVR -Presented with shortness of breath RVR to 150s.   -Initially started on IV Cardizem drip.  Cardiology switched to amiodarone drip.   -Continue monitor in telemetry. -Currently on Eliquis. -Patient will need cardioversion once EGD/colonoscopy is done to look for source of bleeding.  Acute exacerbation of chronic diastolic CHF -Has significant bilateral pedal edema. -Heart failure probably precipitated by RVR. -Pending echocardiogram.  -Currently on IV Lasix 40 mg twice daily. -Not sure if intake and output documentation is clear.  He seems to be 4.6 L positive. -Continue to monitor blood pressure, daily weight, intake output, renal function and electrolytes. Recent Labs    05/07/20 2230 07/07/20 1505  BNP 273.2* 276.1*   Acute anemia -Noted a drop in hemoglobin from 17 to 10.6 in a span of 2 months.  On Eliquis.  FOBT positive. -GI consult appreciated.  Patient needs a EGD and colonoscopy.  However he is not interested yet.  He wants to talk with his wife.  Hypokalemia/hypomagnesemia -Morning labs today, potassium is low at 3.2 and magnesium is low at 1.6.  Secondary to diuresis. -Replacement ordered. Recent Labs  Lab 07/07/20 1505 07/08/20 0143 07/09/20 0553  K 4.2 4.0 3.2*  MG  --   --  1.6*  PHOS  --   --  3.2   Morbid obesity - Body mass index is 47.96 kg/m. Patient has been advised to make an attempt to improve diet and exercise patterns to aid in weight loss.  OSA -CPAP at bedtime  History of unprovoked bilateral DVT -Chronic Eliquis  PAD  -h/o distal fem-pop 2015, chronic total occlusive R  tibioperoneal trunk DVT 2016  Chronic bilateral lower extremity edema -Acute on chronic worsening.  Significant oozing at the time of dressing change per RN. -Expected to improve with IV Lasix, leg elevation. -Wound care nurse consult appreciated.  Recommendation is to clean his legs with soap and water and pat dry, apply Xeroform, cover with ABD pad and secure with Kerlix and Ace wrap.  Dressings are changed daily.  Mobility: Limited mobility in the house.  PT eval ordered. Code Status:   Code Status: DNR  Nutritional status: Body mass index is 47.96 kg/m.     Diet Order            Diet Heart Room service appropriate? Yes; Fluid consistency: Thin; Fluid restriction: 1800 mL Fluid  Diet effective now                 DVT prophylaxis:  Heparin drip Antimicrobials:  IV ceftriaxone IV vancomycin Fluid: No need of IV fluid Consultants: Cardiology, GI Family Communication:  None at bedside  Status is: Inpatient  Remains inpatient appropriate because -needs GI evaluation, possible EGD/colonoscopy and cardioversion  Dispo: The patient is from: Home              Anticipated d/c is to: Home likely              Anticipated d/c date is: > 3 days              Patient currently is not medically stable to d/c.   Infusions:  . sodium chloride 250 mL (07/08/20 1514)  . amiodarone 30 mg/hr (07/09/20 0056)  . cefTRIAXone (ROCEPHIN)  IV 2 g (07/08/20 1522)  . heparin 1,550 Units/hr (07/09/20 0946)  . magnesium sulfate bolus IVPB    . vancomycin 750 mg (07/09/20 0526)    Scheduled Meds: . furosemide  40 mg Intravenous BID  . gatifloxacin  1 drop Left Eye QID  . [START ON 07/10/2020] magnesium oxide  400 mg Oral Daily  . pantoprazole  40 mg Oral Daily  . potassium chloride  40 mEq Oral BID  . sodium chloride flush  3 mL Intravenous Q12H  . triamcinolone  1 application Topical BID  . vitamin B-12  1,000 mcg Oral Daily    Antimicrobials: Anti-infectives (From admission, onward)    Start     Dose/Rate Route Frequency Ordered Stop   07/08/20 1600  cefTRIAXone (ROCEPHIN) 2 g in sodium chloride 0.9 % 100 mL IVPB        2 g 200 mL/hr over 30 Minutes Intravenous Every 24 hours 07/07/20 1931     07/08/20 0430  vancomycin (VANCOREADY) IVPB 750 mg/150 mL        750 mg 150 mL/hr over 60 Minutes Intravenous Every 12 hours 07/07/20 1632     07/07/20 1545  vancomycin (VANCOCIN) IVPB 1000 mg/200 mL premix  Status:  Discontinued        1,000 mg 200 mL/hr over 60 Minutes Intravenous  Once 07/07/20 1531 07/07/20 1538   07/07/20 1545  cefTRIAXone (ROCEPHIN) 2 g in sodium chloride 0.9 % 100 mL IVPB  2 g 200 mL/hr over 30 Minutes Intravenous  Once 07/07/20 1531 07/07/20 1624   07/07/20 1545  vancomycin (VANCOREADY) IVPB 2000 mg/400 mL        2,000 mg 200 mL/hr over 120 Minutes Intravenous  Once 07/07/20 1538 07/07/20 2002      PRN meds: sodium chloride, acetaminophen, ondansetron (ZOFRAN) IV, sodium chloride flush   Objective: Vitals:   07/08/20 2353 07/09/20 0532  BP: 112/73 123/85  Pulse:  (!) 135  Resp: 20 20  Temp: 98 F (36.7 C) 98.1 F (36.7 C)  SpO2: 96% 99%    Intake/Output Summary (Last 24 hours) at 07/09/2020 1051 Last data filed at 07/09/2020 7681 Gross per 24 hour  Intake 1466.32 ml  Output 1525 ml  Net -58.68 ml   Filed Weights   07/07/20 2106 07/08/20 0422 07/09/20 0532  Weight: (!) 139.5 kg (!) 139.1 kg (!) 138.9 kg   Weight change: 21 kg Body mass index is 47.96 kg/m.   Physical Exam: General exam: Pleasant elderly Caucasian male.  Not in distress.  Morbidly obese. Skin: No rashes, lesions or ulcers. HEENT: Atraumatic, normocephalic, no obvious bleeding Lungs: Clear to auscultation bilaterally CVS: Regular tachycardia, no murmur GI/Abd soft, nontender, distended from obesity, bowel sound present CNS: Alert, awake, oriented x3 Psychiatry: Mood appropriate Extremities: 2-3+ pedal edema bilaterally with oozing of serous fluid.  Data  Review: I have personally reviewed the laboratory data and studies available.  Recent Labs  Lab 07/07/20 1505 07/09/20 0553  WBC 13.7* 7.5  NEUTROABS 9.6* 4.9  HGB 10.6* 9.5*  HCT 36.5* 31.4*  MCV 97.9 94.3  PLT 272 207   Recent Labs  Lab 07/07/20 1505 07/08/20 0143 07/09/20 0553  NA 137 136 136  K 4.2 4.0 3.2*  CL 99 99 97*  CO2 25 25 29   GLUCOSE 122* 125* 100*  BUN 15 15 17   CREATININE 0.94 0.92 1.10  CALCIUM 9.2 8.9 8.8*  MG  --   --  1.6*  PHOS  --   --  3.2    F/u labs ordered.  Signed, Terrilee Croak, MD [facility] 07/09/2020

## 2020-07-09 NOTE — Progress Notes (Signed)
PT Cancellation Note  Patient Details Name: Shane Diaz MRN: [id number] DOB: [birth year]   Cancelled Treatment:    Reason Eval/Treat Not Completed: Patient not medically ready. HR is in the 130s at rest. Will hold for now and reassess later.     Bettyanne Dittman 07/09/2020, 8:48 AM

## 2020-07-09 NOTE — Progress Notes (Signed)
Patient requesting bed alarm turned off while sitting up due to bed constant alarming with minimal movement.  RN requested patient to call prior to standing to which patient is agreeable.

## 2020-07-09 NOTE — Progress Notes (Signed)
[place] for IV Heparin Indication: atrial fibrillation and DVT  No Known Allergies  Patient Measurements: Height: 5\' 7"  (170.2 cm) Weight: (!) 138.9 kg (306 lb 3.5 oz) IBW/kg (Calculated) : 66.1 Heparin Dosing Weight: 93 kg  Vital Signs: Temp: 98.5 F (36.9 C) (12/06 1128) BP: 113/81 (12/06 1128) Pulse Rate: 136 (12/06 1128)  Labs: Recent Labs    07/07/20 1505 07/07/20 1832 07/08/20 0143 07/08/20 1909 07/09/20 0553 07/09/20 1556  HGB 10.6*  --   --   --  9.5*  --   HCT 36.5*  --   --   --  31.4*  --   PLT 272  --   --   --  207  --   APTT  --   --   --  39* 54* 58*  LABPROT 15.3*  --   --   --   --   --   INR 1.3*  --   --   --   --   --   HEPARINUNFRC  --   --   --  >2.20* >2.20*  --   CREATININE 0.94  --  0.92  --  1.10  --   TROPONINIHS 14 17  --   --   --   --     Estimated Creatinine Clearance: 79.3 mL/min (by C-G formula based on SCr of 1.1 mg/dL).  Assessment: 74 y.o. male with hx of atrial fibrillation and DVT, on apixaban PTA (per med rec, last dose on 12/4 AM, currently on hold inpt; Hgb drop, FOBT positive, GI consulted) was admitted for sepsis secondary to bilateral LE cellulitis and acute exacerbation of chronic diastolic HF. Pharmacy was consulted to dose IV heparin. Given recent exposure to apixaban and elevated heparin level on admission, will monitor anticoagulation using aPTT until aPTT and heparin levels correlate. Also, we are using lower goal ranges for aPTT.  aPTT ~6 hrs after increasing heparin infusion to 1500 units/hr was 58 sec, which remains below the goal range for this pt. H/H 9.5/31.4, platelets 207. Per RN, no bleeding observed, but she said pt's IV has been beeping on/off all day and beeping finally resolved ~1700 PM today, so aPTT may not be accurate.  Goal of Therapy:  Heparin level 0.3-0.5 units/ml aPTT 66-85 seconds Monitor platelets by anticoagulation protocol: Yes   Plan:  Since pt's IV  has not been running as intended off/on throughout the day, aPTT likely not accurate, so will not increase heparin infusion at this time; continue heparin infusion at 1550 units/hr  Check aPTT, heparin level in ~7 hrs Monitor daily aPTT, heparin level, CBC Monitor for signs/symptoms of bleeding  Gillermina Hu, PharmD, BCPS, [facility] Clinical Pharmacist 07/09/20, 1820 PM

## 2020-07-09 NOTE — Progress Notes (Signed)
Echo attempted at 2:15, HR still over 130. Only do echo under 100bmp per order. Will re-attempt at a later time. [place]

## 2020-07-09 NOTE — Progress Notes (Signed)
[place] for  Heparin Indication: atrial fibrillation and DVT  No Known Allergies  Patient Measurements: Height: 5\' 7"  (170.2 cm) Weight: (!) 138.9 kg (306 lb 3.5 oz) IBW/kg (Calculated) : 66.1 Heparin Dosing Weight: 93 kg  Vital Signs: Temp: 98.1 F (36.7 C) (12/06 0532) Temp Source: Oral (12/06 0532) BP: 123/85 (12/06 0532) Pulse Rate: 135 (12/06 0532)  Labs: Recent Labs    07/07/20 1505 07/07/20 1832 07/08/20 0143 07/08/20 1909 07/09/20 0553  HGB 10.6*  --   --   --  9.5*  HCT 36.5*  --   --   --  31.4*  PLT 272  --   --   --  207  APTT  --   --   --  39* 54*  LABPROT 15.3*  --   --   --   --   INR 1.3*  --   --   --   --   HEPARINUNFRC  --   --   --  >2.20* >2.20*  CREATININE 0.94  --  0.92  --  1.10  TROPONINIHS 14 17  --   --   --     Estimated Creatinine Clearance: 79.3 mL/min (by C-G formula based on SCr of 1.1 mg/dL).  Assessment: 74 y.o. male with h/o Afib and DVT, Eliquis on hold, for heparin  Goal of Therapy:  Heparin level 0.3-0.5 units/ml aPTT 66-85 seconds Monitor platelets by anticoagulation protocol: Yes   Plan:  Increase Heparin 1550 units/hr APTT in 8 hours  Phillis Knack, PharmD, BCPS

## 2020-07-09 NOTE — Progress Notes (Signed)
   07/08/20 2011  Assess: MEWS Score  Temp 99.2 F (37.3 C)  BP (!) 138/94  Pulse Rate (!) 139  Resp 20  SpO2 100 %  O2 Device Nasal Cannula  O2 Flow Rate (L/min) 2 L/min  Assess: MEWS Score  MEWS Temp 0  MEWS Systolic 0  MEWS Pulse 3  MEWS RR 0  MEWS LOC 0  MEWS Score 3  MEWS Score Color Yellow  Assess: if the MEWS score is Yellow or Red  Were vital signs taken at a resting state? Yes  Focused Assessment No change from prior assessment  Early Detection of Sepsis Score *See Row Information* Medium  MEWS guidelines implemented *See Row Information* No, previously yellow, continue vital signs every 4 hours  Treat  Pain Scale 0-10  Pain Score 0  Notify: Charge Nurse/RN  Name of Charge Nurse/RN Notified [place], RN  Date Charge Nurse/RN Notified 07/08/20  Time Charge Nurse/RN Notified 2015  Document  Patient Outcome Other (Comment) (on amio drip hr continues to be elevated)  Progress note created (see row info) Yes   Patient MEWS yellow due to HR. Patient on amiodarone drip.  Patient's HR has been elevated this admission and chronically yellow.  VS will continue every four hours.

## 2020-07-09 NOTE — TOC Transition Note (Addendum)
Transition of Care [facility]) - CM/SW Discharge Note   Patient Details  Name: Shane Diaz MRN: [id number] Date of Birth: [birth year]  [facility] CM/SW Contact:  Zenon Mayo, RN Phone Number: 07/09/2020, 4:06 PM   Clinical Narrative:    NCM spoke with patient, offered choice for [facility] for CHF management and possible unaboots.  He states he will speake with his wife in the am to see which agency they would like .  NCM left the agency list with him.  Will check back in am.   12/7- NCM checked back with patient, he states that he does not want any HH services at all , this is what he and his wife decided.    Final next level of care: [place] Barriers to Discharge: Continued Medical Work up   Patient Goals and CMS Choice Patient states their goals for this hospitalization and ongoing recovery are:: get better CMS Medicare.gov Compare Post Acute Care list provided to:: Patient Choice offered to / list presented to : Patient  Discharge Placement                       Discharge Plan and Services                  DME Agency: NA       HH Arranged: RN, Disease Management          Social Determinants of Health (SDOH) Interventions     Readmission Risk Interventions No flowsheet data found.

## 2020-07-10 DIAGNOSIS — I4819 Other persistent atrial fibrillation: Secondary | ICD-10-CM | POA: Diagnosis not present

## 2020-07-10 LAB — MAGNESIUM: Magnesium: 1.8 mg/dL (ref 1.7–2.4)

## 2020-07-10 LAB — CBC WITH DIFFERENTIAL/PLATELET
Abs Immature Granulocytes: 0.05 10*3/uL (ref 0.00–0.07)
Basophils Absolute: 0 10*3/uL (ref 0.0–0.1)
Basophils Relative: 1 %
Eosinophils Absolute: 0.2 10*3/uL (ref 0.0–0.5)
Eosinophils Relative: 3 %
HCT: 29.5 % — ABNORMAL LOW (ref 39.0–52.0)
Hemoglobin: 9.3 g/dL — ABNORMAL LOW (ref 13.0–17.0)
Immature Granulocytes: 1 %
Lymphocytes Relative: 20 %
Lymphs Abs: 1.5 10*3/uL (ref 0.7–4.0)
MCH: 28.9 pg (ref 26.0–34.0)
MCHC: 31.5 g/dL (ref 30.0–36.0)
MCV: 91.6 fL (ref 80.0–100.0)
Monocytes Absolute: 0.8 10*3/uL (ref 0.1–1.0)
Monocytes Relative: 11 %
Neutro Abs: 4.7 10*3/uL (ref 1.7–7.7)
Neutrophils Relative %: 64 %
Platelets: 207 10*3/uL (ref 150–400)
RBC: 3.22 MIL/uL — ABNORMAL LOW (ref 4.22–5.81)
RDW: 15 % (ref 11.5–15.5)
WBC: 7.2 10*3/uL (ref 4.0–10.5)
nRBC: 0 % (ref 0.0–0.2)

## 2020-07-10 LAB — BASIC METABOLIC PANEL
Anion gap: 11 (ref 5–15)
BUN: 17 mg/dL (ref 8–23)
CO2: 28 mmol/L (ref 22–32)
Calcium: 8.8 mg/dL — ABNORMAL LOW (ref 8.9–10.3)
Chloride: 97 mmol/L — ABNORMAL LOW (ref 98–111)
Creatinine, Ser: 1.08 mg/dL (ref 0.61–1.24)
GFR, Estimated: >60 mL/min (ref >60)
Glucose, Bld: 97 mg/dL (ref 70–99)
Potassium: 3.4 mmol/L — ABNORMAL LOW (ref 3.5–5.1)
Sodium: 136 mmol/L (ref 135–145)

## 2020-07-10 LAB — APTT
aPTT: 51 seconds — ABNORMAL HIGH (ref 24–36)
aPTT: 49 seconds — ABNORMAL HIGH (ref 24–36)
aPTT: 74 seconds — ABNORMAL HIGH (ref 24–36)

## 2020-07-10 LAB — HEPARIN LEVEL (UNFRACTIONATED)
Heparin Unfractionated: 1.38 IU/mL — ABNORMAL HIGH (ref 0.30–0.70)
Heparin Unfractionated: 0.98 IU/mL — ABNORMAL HIGH (ref 0.30–0.70)

## 2020-07-10 MED ORDER — PEG-KCL-NACL-NASULF-NA ASC-C 100 G PO SOLR
0.5000 | Freq: Once | ORAL | Status: DC
  Filled 2020-07-10: qty 1

## 2020-07-10 MED ORDER — METOPROLOL TARTRATE 5 MG/5ML IV SOLN
5.0000 mg | Freq: Four times a day (QID) | INTRAVENOUS | Status: DC | PRN
  Administered 2020-07-10 – 2020-07-11 (×2): 5 mg via INTRAVENOUS
  Filled 2020-07-10 (×3): qty 5

## 2020-07-10 MED ORDER — PEG-KCL-NACL-NASULF-NA ASC-C 100 G PO SOLR: 1.0000 | Freq: Once | ORAL | Status: DC

## 2020-07-10 MED ORDER — METOPROLOL TARTRATE 25 MG PO TABS
25.0000 mg | ORAL_TABLET | Freq: Two times a day (BID) | ORAL | Status: DC
  Administered 2020-07-10 – 2020-07-12 (×5): 25 mg via ORAL
  Filled 2020-07-10 (×5): qty 1

## 2020-07-10 MED ORDER — PEG-KCL-NACL-NASULF-NA ASC-C 100 G PO SOLR
0.5000 | Freq: Once | ORAL | Status: AC
  Administered 2020-07-10: 100 g via ORAL
  Filled 2020-07-10 (×2): qty 1

## 2020-07-10 MED ORDER — AMOXICILLIN-POT CLAVULANATE 875-125 MG PO TABS
1.0000 | ORAL_TABLET | Freq: Two times a day (BID) | ORAL | Status: AC
  Administered 2020-07-10 – 2020-07-13 (×8): 1 via ORAL
  Filled 2020-07-10 (×8): qty 1

## 2020-07-10 MED ORDER — HEPARIN BOLUS VIA INFUSION
2000.0000 [IU] | Freq: Once | INTRAVENOUS | Status: DC
  Filled 2020-07-10: qty 2000

## 2020-07-10 NOTE — Progress Notes (Signed)
Patient removed lower leg wrapping

## 2020-07-10 NOTE — Progress Notes (Signed)
ANTICOAGULATION CONSULT NOTE   Pharmacy Consult for  Heparin Indication: atrial fibrillation and DVT  No Known Allergies  Patient Measurements: Height: 5\' 7"  (258.5 cm) Weight: (!) 138.9 kg (306 lb 3.5 oz) IBW/kg (Calculated) : 66.1 Heparin Dosing Weight: 93 kg  Vital Signs: Temp: 98.6 F (37 C) (12/07 0104) Temp Source: Oral (12/07 0104) BP: 118/75 (12/07 0104) Pulse Rate: 133 (12/07 0104)  Labs: Recent Labs    07/07/20 1505 07/07/20 1505 07/07/20 1832 07/08/20 0143 07/08/20 1909 07/09/20 0553 07/09/20 1556 07/10/20 0155  HGB 10.6*   < >  --   --   --  9.5*  --  9.3*  HCT 36.5*  --   --   --   --  31.4*  --  29.5*  PLT 272  --   --   --   --  207  --  207  APTT  --    < >  --   --  39* 54* 58* 51*  LABPROT 15.3*  --   --   --   --   --   --   --   INR 1.3*  --   --   --   --   --   --   --   HEPARINUNFRC  --   --   --   --  >2.20* >2.20*  --  1.38*  CREATININE 0.94   < >  --  0.92  --  1.10  --  1.08  TROPONINIHS 14  --  17  --   --   --   --   --    < > = values in this interval not displayed.    Estimated Creatinine Clearance: 80.8 mL/min (by C-G formula based on SCr of 1.08 mg/dL).  Assessment: 74 y.o. male with h/o Afib and DVT, Eliquis on hold, for heparin  Goal of Therapy:  Heparin level 0.3-0.5 units/ml aPTT 66-85 seconds Monitor platelets by anticoagulation protocol: Yes   Plan:  Increase Heparin 1750 units/hr APTT in 8 hours  Phillis Knack, PharmD, BCPS

## 2020-07-10 NOTE — Progress Notes (Signed)
ANTICOAGULATION CONSULT NOTE   Pharmacy Consult for  Heparin Indication: atrial fibrillation and DVT  No Known Allergies  Patient Measurements: Height: 5\' 7"  (170.2 cm) Weight: (!) 136.6 kg (301 lb 2.4 oz) IBW/kg (Calculated) : 66.1 Heparin Dosing Weight: 93 kg  Vital Signs: Temp: 98.5 F (36.9 C) (12/07 1145) Temp Source: Oral (12/07 1145) BP: 119/87 (12/07 1145) Pulse Rate: 138 (12/07 1145)  Labs: Recent Labs    07/07/20 1505 07/07/20 1505 07/07/20 1832 07/08/20 0143 07/08/20 1909 07/09/20 0553 07/09/20 0553 07/09/20 1556 07/10/20 0155 07/10/20 1110  HGB 10.6*   < >  --   --   --  9.5*  --   --  9.3*  --   HCT 36.5*  --   --   --   --  31.4*  --   --  29.5*  --   PLT 272  --   --   --   --  207  --   --  207  --   APTT  --    < >  --   --  39* 54*   < > 58* 51* 49*  LABPROT 15.3*  --   --   --   --   --   --   --   --   --   INR 1.3*  --   --   --   --   --   --   --   --   --   HEPARINUNFRC  --   --   --   --  >2.20* >2.20*  --   --  1.38*  --   CREATININE 0.94   < >  --  0.92  --  1.10  --   --  1.08  --   TROPONINIHS 14  --  17  --   --   --   --   --   --   --    < > = values in this interval not displayed.    Estimated Creatinine Clearance: 80 mL/min (by C-G formula based on SCr of 1.08 mg/dL).  Assessment: 74 y.o. Diaz with h/o Afib and DVT, Eliquis on hold, for heparin.  APTT  Decreased to 49 seconds after  Heparin rate increased to 1750 units/hr.  No issues with heparin infusion per RN report.  Eliquis last given 12/5 AM.   APTT trending down despite increases in heparin infusion rate.    Obese patient with BMI = 47 Acute anemia on admit with MD noting a drop in hemoglobin from 17 to 10.6 in a span of 2 months.  Hgb >9.5>9.3, pltc 207 stable. No bleeding reported/confirmed with RN.   Goal of Therapy:  Heparin level 0.3-0.5 units/ml aPTT 66-85 seconds Monitor platelets by anticoagulation protocol: Yes   Plan:  Increase Heparin rate to 2000  units/hr Check APTT and HL in 8 hours   Nicole Cella, [place] Clinical Pharmacist [phone] Please check AMION for all [place] phone numbers After 10:00 PM, call [place] [phone] 07/10/2020 11:59 AM

## 2020-07-10 NOTE — Anesthesia Preprocedure Evaluation (Addendum)
Anesthesia Evaluation  Patient identified by MRN, date of birth, ID band Patient awake    Reviewed: Allergy & Precautions, NPO status , Patient's Chart, lab work & pertinent test results  Airway Mallampati: IV  TM Distance: >3 FB Neck ROM: Full    Dental no notable dental hx. (+) Teeth Intact, Dental Advisory Given   Pulmonary sleep apnea ,    Pulmonary exam normal breath sounds clear to auscultation       Cardiovascular Exercise Tolerance: Good hypertension, Pt. on medications Normal cardiovascular exam Rhythm:Regular Rate:Normal     Neuro/Psych negative neurological ROS  negative psych ROS   GI/Hepatic negative GI ROS, Neg liver ROS,   Endo/Other  Morbid obesity  Renal/GU Renal disease     Musculoskeletal   Abdominal   Peds  Hematology negative hematology ROS (+)   Anesthesia Other Findings   Reproductive/Obstetrics negative OB ROS                            Anesthesia Physical Anesthesia Plan  ASA: III  Anesthesia Plan: General   Post-op Pain Management:    Induction: Intravenous  PONV Risk Score and Plan: Treatment may vary due to age or medical condition  Airway Management Planned: Oral ETT  Additional Equipment: None  Intra-op Plan:   Post-operative Plan: Extubation in OR  Informed Consent: I have reviewed the patients History and Physical, chart, labs and discussed the procedure including the risks, benefits and alternatives for the proposed anesthesia with the patient or authorized representative who has indicated his/her understanding and acceptance.     Dental advisory given  Plan Discussed with: CRNA and Anesthesiologist  Anesthesia Plan Comments:        Anesthesia Quick Evaluation

## 2020-07-10 NOTE — Progress Notes (Signed)
PROGRESS NOTE  Shane Diaz  DOB: [birth year]  PCP: [name], MD FEO:[id number]  DOA: 07/07/2020  LOS: 3 days   Chief Complaint  Patient presents with  . Chest Pain  . Shortness of Breath  . Leg Swelling   Brief narrative: Shane Diaz is a 74 y.o. 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 On 07/07/2020, patient presented to the ED with complaint of acute onset left-sided chest pain and shortness of breath.  Over the past few months, patient has noticed that his heart rate is often in the 130s to 150s range, otherwise asymptomatic. He was recently admitted (10/4-10/7) for generalized weakness, found to be in A. fib with RVR, responded to Cardizem drip and was discharged home on low-dose beta-blocker.  During that hospitalization, he was also found to have Covid pneumonia, received standard treatment and was discharged on 2 L oxygen. Patient lives at home with his wife, and is fairly sedentary in the house.  In the ED, patient had a temperature, tachycardic to 150s, tachypneic in 20s, required 3 L oxygen by nasal cannula. EKG showed A. fib with RVR.  Patient was started on Cardizem drip. He also had acute worsening chronic bilateral lower extremity edema. Initial lactic acid level was elevated to 2.8 subsequently down to 1.4 after IV fluid. Patient was admitted to hospitalist service.   Cardiology and GI service following. GI plans to do EGD/colonoscopy tomorrow 12/8.  Cardiology plans to do cardioversion after that.  Subjective: Patient was seen and examined this morning. Morbidly obese male.  Lying down in bed.  On supplemental oxygen.  Not in distress. Continues to have oozing of serous fluid from both his legs.   Assessment/Plan: Sepsis secondary to bilateral lower extremity cellulitis -WBC and lactic acid level have improved. -Switch from broad-spectrum antibiotics to oral  Augmentin today.  A. fib with RVR -Presented with shortness of breath RVR to 150s.   -On amiodarone drip and heparin drip.  Eliquis on hold.   -Patient is planned for cardioversion once EGD/colonoscopy is done to look for source of bleeding.  Acute exacerbation of chronic diastolic CHF -Has significant bilateral pedal edema. -Heart failure probably precipitated by RVR. -Pending echocardiogram.  -Currently on IV Lasix 40 mg twice daily. -Net negative balance of 1 L in last 24 hours. -Continue to monitor for daily intake output, weight, blood pressure, BNP, renal function and electrolytes. Recent Labs  Lab 07/07/20 1505 07/08/20 0143 07/09/20 0553 07/10/20 0155  BNP 276.1*  --   --   --   BUN _0 CREATININE 0.94 0.92 1.10 1.08  K 4.2 4.0 3.2* 3.4*  MG  --   --  1.6* 1.8   Hypokalemia/hypomagnesemia -Potassium low at 3.4, magnesium low at 1.8 this morning.  Continue daily replacement.  Recheck tomorrow.    Acute anemia -Noted a drop in hemoglobin from 17 to 10.6 in a span of 2 months. On Eliquis. FOBT positive. -GI consult appreciated. Patient is planned for an EGD and colonoscopy tomorrow 12/8.  Morbid obesity - Body mass index is 47.17 kg/m. Patient has been advised to make an attempt to improve diet and exercise patterns to aid in weight loss.  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  Significant  oozing at the time of dressing change per RN. -Expected to improve with IV Lasix, leg elevation. -Wound care nurse consult appreciated.  Recommendation is to clean his legs with soap and water and pat dry, apply Xeroform, cover with ABD pad and secure with Kerlix and Ace wrap.  Dressings are changed daily.  Mobility: Limited mobility in the house.  PT eval ordered. Code  Status:   Code Status: DNR  Nutritional status: Body mass index is 47.17 kg/m.     Diet Order            Diet NPO time specified  Diet effective midnight           Diet NPO time specified  Diet effective midnight           Diet clear liquid Room service appropriate? Yes; Fluid consistency: Thin  Diet effective now                 DVT prophylaxis: Heparin drip Antimicrobials:  Switched oral Augmentin today Fluid: No need of IV fluid Consultants: Cardiology, GI Family Communication:  None at bedside  Status is: Inpatient  Remains inpatient appropriate because -needs GI evaluation, possible EGD/colonoscopy and cardioversion  Dispo: The patient is from: Home              Anticipated d/c is to: Home likely              Anticipated d/c date is: 2 to 3 days              Patient currently is not medically stable to d/c.   Infusions:  . sodium chloride 250 mL (07/08/20 1514)  . amiodarone 30 mg/hr (07/10/20 1324)  . cefTRIAXone (ROCEPHIN)  IV 2 g (07/09/20 1618)  . heparin 2,000 Units/hr (07/10/20 1219)  . vancomycin 750 mg (07/10/20 0604)    Scheduled Meds: . furosemide  40 mg Intravenous BID  . gatifloxacin  1 drop Left Eye QID  . magnesium oxide  400 mg Oral Daily  . metoprolol tartrate  25 mg Oral BID  . pantoprazole  40 mg Oral Daily  . peg 3350 powder  0.5 kit Oral Once   And  . [START ON 07/11/2020] peg 3350 powder  0.5 kit Oral Once  . potassium chloride  40 mEq Oral BID  . sodium chloride flush  3 mL Intravenous Q12H  . triamcinolone  1 application Topical BID  . vitamin B-12  1,000 mcg Oral Daily    Antimicrobials: Anti-infectives (From admission, onward)   Start     Dose/Rate Route Frequency Ordered Stop   07/08/20 1600  cefTRIAXone (ROCEPHIN) 2 g in sodium chloride 0.9 % 100 mL IVPB        2 g 200 mL/hr over 30 Minutes Intravenous Every 24 hours 07/07/20 1931     07/08/20 0430  vancomycin (VANCOREADY) IVPB 750 mg/150 mL        750 mg 150 mL/hr over  60 Minutes Intravenous Every 12 hours 07/07/20 1632     07/07/20 1545  vancomycin (VANCOCIN) IVPB 1000 mg/200 mL premix  Status:  Discontinued        1,000 mg 200 mL/hr over 60 Minutes Intravenous  Once 07/07/20 1531 07/07/20 1538   07/07/20 1545  cefTRIAXone (ROCEPHIN) 2 g in sodium chloride 0.9 % 100 mL IVPB        2 g 200 mL/hr over 30 Minutes Intravenous  Once 07/07/20 1531 07/07/20 1624   07/07/20 1545  vancomycin (VANCOREADY)  IVPB 2000 mg/400 mL        2,000 mg 200 mL/hr over 120 Minutes Intravenous  Once 07/07/20 1538 07/07/20 2002      PRN meds: sodium chloride, acetaminophen, metoprolol tartrate, ondansetron (ZOFRAN) IV, sodium chloride flush   Objective: Vitals:   07/10/20 0609 07/10/20 1145  BP: 102/81 119/87  Pulse: (!) 138 (!) 138  Resp: 20 18  Temp: 98.5 F (36.9 C) 98.5 F (36.9 C)  SpO2: 97% 99%    Intake/Output Summary (Last 24 hours) at 07/10/2020 1441 Last data filed at 07/10/2020 1347 Gross per 24 hour  Intake 1660.75 ml  Output 2175 ml  Net -514.25 ml   Filed Weights   07/08/20 0422 07/09/20 0532 07/10/20 0609  Weight: (!) 139.1 kg (!) 138.9 kg (!) 136.6 kg   Weight change: -2.3 kg Body mass index is 47.17 kg/m.   Physical Exam: General exam: Pleasant elderly Caucasian male.  Not in distress.  Morbidly obese. Skin: No rashes, lesions or ulcers. HEENT: Atraumatic, normocephalic, no obvious bleeding Lungs: Clear to auscultation bilaterally CVS: Regular tachycardia, no murmur GI/Abd soft, nontender, distended from obesity, bowel sound present CNS: Alert, awake, oriented x3 Psychiatry: Mood appropriate Extremities: Continues to have 2-3+ pedal edema bilaterally with oozing of serous fluid.  Data Review: I have personally reviewed the laboratory data and studies available.  Recent Labs  Lab 07/07/20 1505 07/09/20 0553 07/10/20 0155  WBC 13.7* 7.5 7.2  NEUTROABS 9.6* 4.9 4.7  HGB 10.6* 9.5* 9.3*  HCT 36.5* 31.4* 29.5*  MCV 97.9 94.3 91.6   PLT 272 207 207   Recent Labs  Lab 07/07/20 1505 07/08/20 0143 07/09/20 0553 07/10/20 0155  NA 137 136 136 136  K 4.2 4.0 3.2* 3.4*  CL 99 99 97* 97*  CO2 _0 GLUCOSE 122* 125* 100* 97  BUN _1 CREATININE 0.94 0.92 1.10 1.08  CALCIUM 9.2 8.9 8.8* 8.8*  MG  --   --  1.6* 1.8  PHOS  --   --  3.2  --     F/u labs ordered.  Signed, Terrilee Croak, MD [facility] 07/10/2020

## 2020-07-10 NOTE — Evaluation (Signed)
Physical Therapy Evaluation Patient Details Name: Shane Diaz MRN: [id number] DOB: Mar 23, 1946 Today's Date: 07/10/2020   History of Present Illness  Shane Diaz is a 74 y.o. male with medical history significant of paroxysmal A. Fib, bilateral DVT and Eliquis, morbid obesity, OSA, prediabetes, presented with worsening of palpitations and worsening bilat LE swelling, itchiness with clear liquid discharge. COVID PNA 05/2020.    Clinical Impression  Pt admitted with above. Pt mobility limited to OOB to chair due to resting HR in 130s bpm. Pt able to transfer to chair with minA, HR remained in 130s. RN deferred ambulation until HR decreases. Suspect once HR under better control pt will progress quickly with mobilization. Pt given seated LE HEP. Acute PT to cont to follow.    Follow Up Recommendations Home health PT;Supervision/Assistance - 24 hour (pending progress)    Equipment Recommendations   (TBD)    Recommendations for Other Services       Precautions / Restrictions Precautions Precautions: Fall Precaution Comments: tachycardia, resting HR 130s Restrictions Weight Bearing Restrictions: No      Mobility  Bed Mobility Overal bed mobility: Needs Assistance Bed Mobility: Supine to Sit     Supine to sit: Mod assist     General bed mobility comments: HOB flat, modA for trunk elevation due to body habitus    Transfers Overall transfer level: Needs assistance Equipment used: 1 person hand held assist Transfers: Sit to/from [facility] Sit to Stand: Min assist Stand pivot transfers: Min assist       General transfer comment: wide base of support, minA to power up from bed, minA to steady during std pvt to chair. Pts HR 132-134bpm  Ambulation/Gait             General Gait Details: unable due to resting HR in 130s  Stairs            Wheelchair Mobility    Modified Rankin (Stroke Patients Only)       Balance Overall balance  assessment: Mild deficits observed, not formally tested                                           Pertinent Vitals/Pain Pain Assessment: 0-10 Pain Score: 2  Pain Location: bilat LEs Pain Descriptors / Indicators: Discomfort Pain Intervention(s): Monitored during session    Home Living Family/patient expects to be discharged to:: Private residence Living Arrangements: Spouse/significant other Available Help at Discharge: Family;Available 24 hours/day Type of Home: House Home Access: Stairs to enter Entrance Stairs-Rails: Psychiatric nurse of Steps: 3 Home Layout: One level Home Equipment: Shower seat      Prior Function Level of Independence: Independent         Comments: drives, grocery shops     Hand Dominance   Dominant Hand: Right    Extremity/Trunk Assessment   Upper Extremity Assessment Upper Extremity Assessment: Generalized weakness    Lower Extremity Assessment Lower Extremity Assessment: Generalized weakness    Cervical / Trunk Assessment Cervical / Trunk Assessment: Normal  Communication   Communication: No difficulties  Cognition Arousal/Alertness: Awake/alert Behavior During Therapy: WFL for tasks assessed/performed Overall Cognitive Status: Within Functional Limits for tasks assessed                                 General Comments: pt  comedian stating we're at this house and he's having a christmas party but [facility]      General Comments General comments (skin integrity, edema, etc.): pt with very edematous LEs that are red with clear drainage    Exercises     Assessment/Plan    PT Assessment Patient needs continued PT services  PT Problem List Decreased strength;Decreased activity tolerance;Decreased balance;Decreased mobility;Decreased coordination       PT Treatment Interventions DME instruction;Gait training;Stair training;Functional mobility training;Therapeutic  activities;Therapeutic exercise;Balance training    PT Goals (Current goals can be found in the Care Plan section)  Acute Rehab PT Goals Patient Stated Goal: home PT Goal Formulation: With patient Time For Goal Achievement: 07/24/20 Potential to Achieve Goals: Good    Frequency Min 3X/week   Barriers to discharge        Co-evaluation               AM-PAC PT "6 Clicks" Mobility  Outcome Measure Help needed turning from your back to your side while in a flat bed without using bedrails?: A Little Help needed moving from lying on your back to sitting on the side of a flat bed without using bedrails?: A Little Help needed moving to and from a bed to a chair (including a wheelchair)?: A Little Help needed standing up from a chair using your arms (e.g., wheelchair or bedside chair)?: A Little Help needed to walk in hospital room?: A Little Help needed climbing 3-5 steps with a railing? : A Lot 6 Click Score: 17    End of Session Equipment Utilized During Treatment: Oxygen Activity Tolerance: Other (comment) (limited by tachycardia) Patient left: in chair;with call bell/phone within reach Nurse Communication: Mobility status PT Visit Diagnosis: Unsteadiness on feet (R26.81);Muscle weakness (generalized) (M62.81)    Time: 9983-3825 PT Time Calculation (min) (ACUTE ONLY): 17 min   Charges:   PT Evaluation $PT Eval Low Complexity: 1 Low          Kittie Plater, PT, DPT Acute Rehabilitation Services Pager #: [phone] Office #: [phone]   Berline Lopes 07/10/2020, 1:32 PM

## 2020-07-10 NOTE — H&P (View-Only) (Signed)
Progress Note  Chief Complaint:    anemia     ASSESSMENT / PLAN:    74 yo male PMH not limited to  OSA, DVTs on Eliquis, PVD, obesity, COVID 19, HTN, hyperlipidemia, Afib, colon polyps.. Admitted new Afib, acute on chronic heart failure and sepsis secondary to BLE cellulitis. Afib /RVR. We have been following inpatient for new onset anemia and Heme positive stool. Cardiology following and plans for cardioversion but awaiting GI evaluation.  --Clear liquids today, NPO after MN for EGD / colonoscopy tomorrow. Reviewed the risks and benefits of EGD and colonoscopy with possible polypectomy / biopsies and the patient agrees to proceed.  --Will hold IV heparin at 5 am for 9 am endoscopic procedures. I will put order in to hold and also let PharmD know.   # Hx of DVT, Eliquis on hold.   # New Afib with RVR. On Amiodarone drip and IV heparin.   # Acute on chronic heart failure, precipitated by Afib / RVR    Attending Physician Note   I have taken an interval history, reviewed the chart and examined the patient. I agree with the Advanced Practitioner's note, impression and recommendations.   *Anemia, heme positive stool on Eliquis. Eliquis on hold. Proceed with colonoscopy and EGD tomorrow. Pt agrees to proceed understanding that he is higher risk for procedure and sedation related complications. The risks (including bleeding, perforation, infection, missed lesions, medication reactions and possible hospitalization or surgery if complications occur), benefits, and alternatives to colonoscopy with possible biopsy and possible polypectomy were discussed with the patient and they consent to proceed. The risks (including bleeding, perforation, infection, missed lesions, medication reactions and possible hospitalization or surgery if complications occur), benefits, and alternatives to endoscopy with possible biopsy and possible dilation were discussed with the patient and they consent to proceed.     *Afib with RVR on Amiodarone. Hold heparin 4 hours prior to procedures tomorrow.   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   *OSA on CPAP  Lucio Edward, MD [facility]       SUBJECTIVE:   No complaints.     OBJECTIVE:    Scheduled inpatient medications:  . furosemide  40 mg Intravenous BID  . gatifloxacin  1 drop Left Eye QID  . magnesium oxide  400 mg Oral Daily  . pantoprazole  40 mg Oral Daily  . potassium chloride  40 mEq Oral BID  . sodium chloride flush  3 mL Intravenous Q12H  . triamcinolone  1 application Topical BID  . vitamin B-12  1,000 mcg Oral Daily   Continuous inpatient infusions:  . sodium chloride 250 mL (07/08/20 1514)  . amiodarone 30 mg/hr (07/10/20 0101)  . cefTRIAXone (ROCEPHIN)  IV 2 g (07/09/20 1618)  . heparin 1,750 Units/hr (07/10/20 2671)  . vancomycin 750 mg (07/10/20 0604)   PRN inpatient medications: sodium chloride, acetaminophen, ondansetron (ZOFRAN) IV, sodium chloride flush  Vital signs in last 24 hours: Temp:  [98.5 F (36.9 C)-98.6 F (37 C)] 98.5 F (36.9 C) (12/07 1145) Pulse Rate:  [133-138] 138 (12/07 1145) Resp:  [18-20] 18 (12/07 1145) BP: (102-130)/(75-87) 119/87 (12/07 1145) SpO2:  [97 %-100 %] 99 % (12/07 1145) Weight:  [136.6 kg] 136.6 kg (12/07 0609) Last BM Date: 07/08/20  Intake/Output Summary (Last 24 hours) at 07/10/2020 1202 Last data filed at 07/10/2020 1148 Gross per 24 hour  Intake 1660.75 ml  Output 1475 ml  Net 185.75 ml     Physical Exam:  . General: Alert male in NAD . Heart:  Tachycardic, irregular rhythm . Pulmonary: Normal respiratory effort . Abdomen: Soft, nondistended, nontender. Normal bowel sounds.  . Neurologic: Alert and oriented . Psych: Pleasant. Cooperative.   Filed Weights   07/08/20 0422 07/09/20 0532 07/10/20 0609  Weight: (!) 139.1 kg (!) 138.9 kg (!) 136.6 kg    Intake/Output from  previous day: 12/06 0701 - 12/07 0700 In: 1560.8 [P.O.:580; I.V.:580.8; IV Piggyback:400] Out: 1325 [Urine:1325] Intake/Output this shift: Total I/O In: 220 [P.O.:220] Out: 550 [Urine:550]    Lab Results: Recent Labs    07/07/20 1505 07/09/20 0553 07/10/20 0155  WBC 13.7* 7.5 7.2  HGB 10.6* 9.5* 9.3*  HCT 36.5* 31.4* 29.5*  PLT 272 207 207   BMET Recent Labs    07/08/20 0143 07/09/20 0553 07/10/20 0155  NA 136 136 136  K 4.0 3.2* 3.4*  CL 99 97* 97*  CO2 25 29 28   GLUCOSE 125* 100* 97  BUN 15 17 17   CREATININE 0.92 1.10 1.08  CALCIUM 8.9 8.8* 8.8*   LFT Recent Labs    07/07/20 1505  PROT 6.7  ALBUMIN 3.2*  AST 22  ALT 18  ALKPHOS 74  BILITOT 0.9   PT/INR Recent Labs    07/07/20 1505  LABPROT 15.3*  INR 1.3*   Hepatitis Panel No results for input(s): HEPBSAG, HCVAB, HEPAIGM, HEPBIGM in the last 72 hours.  No results found.    Active Problems:   A-fib ([place])     LOS: 3 days   Tye Savoy ,NP 07/10/2020, 12:02 PM

## 2020-07-10 NOTE — Progress Notes (Signed)
Progress Note  Patient Name: Shane Diaz Date of Encounter: 07/10/2020  [facility] Cardiologist: Donato Heinz, MD   Subjective   74 year old gentleman with a history of obstructive sleep apnea, prediabetes morbid obesity.  He is admitted with worsening shortness of breath and was found to have atrial  flutter.  He has been found to have an acute drop in his hemoglobin from 17 down to 10 g. He is heme-positive. He has been seen by gastroenterology and is scheduled for upper and lower endoscopy. Our plan is to make sure that he is evaluated for possible GI bleeding before we commit him to cardioversion and 3 to 4 weeks of anticoagulation.    He has been seen by GI.  Scheduled for endoscopy procedure tomorrow   Inpatient Medications    Scheduled Meds: . furosemide  40 mg Intravenous BID  . gatifloxacin  1 drop Left Eye QID  . magnesium oxide  400 mg Oral Daily  . pantoprazole  40 mg Oral Daily  . peg 3350 powder  1 kit Oral Once  . potassium chloride  40 mEq Oral BID  . sodium chloride flush  3 mL Intravenous Q12H  . triamcinolone  1 application Topical BID  . vitamin B-12  1,000 mcg Oral Daily   Continuous Infusions: . sodium chloride 250 mL (07/08/20 1514)  . amiodarone 30 mg/hr (07/10/20 0101)  . cefTRIAXone (ROCEPHIN)  IV 2 g (07/09/20 1618)  . heparin 2,000 Units/hr (07/10/20 1219)  . vancomycin 750 mg (07/10/20 0604)   PRN Meds: sodium chloride, acetaminophen, ondansetron (ZOFRAN) IV, sodium chloride flush   Vital Signs    Vitals:   07/09/20 2128 07/10/20 0104 07/10/20 0609 07/10/20 1145  BP: 130/75 118/75 102/81 119/87  Pulse: (!) 135 (!) 133 (!) 138 (!) 138  Resp: 20 20 20 18   Temp: 98.6 F (37 C) 98.6 F (37 C) 98.5 F (36.9 C) 98.5 F (36.9 C)  TempSrc: Oral Oral Oral Oral  SpO2: 100% 97% 97% 99%  Weight:   (!) 136.6 kg   Height:        Intake/Output Summary (Last 24 hours) at 07/10/2020 1240 Last data filed at 07/10/2020  1148 Gross per 24 hour  Intake 1660.75 ml  Output 1475 ml  Net 185.75 ml   Last 3 Weights 07/10/2020 07/09/2020 07/08/2020  Weight (lbs) 301 lb 2.4 oz 306 lb 3.5 oz 306 lb 10.6 oz  Weight (kg) 136.6 kg 138.9 kg 139.1 kg      Telemetry    Atrial fib / flutter  with RVR - Personally Reviewed  ECG       Physical Exam   GEN: No acute distress.  Elderly male,  NAD , moderately obese.  Neck: No JVD Cardiac: regular, tachy     Respiratory: Clear to auscultation bilaterally. GI: Soft, nontender, non-distended  MS: No edema; No deformity. Neuro:  Nonfocal  Psych: Normal affect   Labs    High Sensitivity Troponin:   Recent Labs  Lab 07/07/20 1505 07/07/20 1832  TROPONINIHS 14 17      Chemistry Recent Labs  Lab 07/07/20 1505 07/07/20 1505 07/08/20 0143 07/09/20 0553 07/10/20 0155  NA 137   < > 136 136 136  K 4.2   < > 4.0 3.2* 3.4*  CL 99   < > 99 97* 97*  CO2 25   < > 25 29 28   GLUCOSE 122*   < > 125* 100* 97  BUN 15   < >  15 17 17   CREATININE 0.94   < > 0.92 1.10 1.08  CALCIUM 9.2   < > 8.9 8.8* 8.8*  PROT 6.7  --   --   --   --   ALBUMIN 3.2*  --   --   --   --   AST 22  --   --   --   --   ALT 18  --   --   --   --   ALKPHOS 74  --   --   --   --   BILITOT 0.9  --   --   --   --   GFRNONAA >60   < > >60 >60 >60  ANIONGAP 13   < > 12 10 11    < > = values in this interval not displayed.     Hematology Recent Labs  Lab 07/07/20 1505 07/09/20 0553 07/10/20 0155  WBC 13.7* 7.5 7.2  RBC 3.73* 3.33* 3.22*  HGB 10.6* 9.5* 9.3*  HCT 36.5* 31.4* 29.5*  MCV 97.9 94.3 91.6  MCH 28.4 28.5 28.9  MCHC 29.0* 30.3 31.5  RDW 15.3 15.3 15.0  PLT 272 207 207    BNP Recent Labs  Lab 07/07/20 1505  BNP 276.1*     DDimer No results for input(s): DDIMER in the last 168 hours.   Radiology    No results found.  Cardiac Studies     Patient Profile     74 y.o. male admitted with dyspnea and an abrupt fall in his Hb. He was also found to have atrial  fib  Assessment & Plan    1.  Shortness of breath: His shortness of breath is likely multifactorial.  He has had an abrupt fall in his hemoglobin from 17.8 in October down to 10.62 months later.    echo has been ordered.  His HR has been fast for a month or so .   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   2.  Atrial fibrillation with rapid ventricular response: He is currently off his DOAC.  Continue heparin.  He is currently on amiodarone drip. HR is fairly regular at 135 - ? Atrial flutter   3.  Acute GI bleed: He dropped his hemoglobin 7 g over the course of 2 months.  He is heme-positive.  I completely agree with GI work-up prior to attempting cardioversion.      For questions or updates, please contact [place] Please consult [email or web address] for contact info under        Signed, Mertie Moores, MD  07/10/2020, 12:40 PM

## 2020-07-10 NOTE — Progress Notes (Signed)
Pharmacy Antibiotic Note  Shane Diaz is a 74 y.o. male admitted on 07/07/2020 with cellulitisPharmacy consulted 12/4 for Vancomycin dosing.  WBC within normal.  Afebrile.  Blood cultures no growth to date. Scr stable ~ 1,  Scr 1.08 today.  I/O 1560.8 ml/1325 ml (0.4 ml/kg/hr).   Height: 5\' 7"  (170.2 cm) Weight: (!) 136.6 kg (301 lb 2.4 oz) IBW/kg (Calculated) : 66.1  Temp (24hrs), Avg:98.6 F (37 C), Min:98.5 F (36.9 C), Max:98.6 F (37 C)  Recent Labs  Lab 07/07/20 1505 07/07/20 1832 07/08/20 0143 07/09/20 0553 07/10/20 0155  WBC 13.7*  --   --  7.5 7.2  CREATININE 0.94  --  0.92 1.10 1.08  LATICACIDVEN 2.5* 1.4  --   --   --     Estimated Creatinine Clearance: 80 mL/min (by C-G formula based on SCr of 1.08 mg/dL).    No Known Allergies  Antimicrobials this admission: 12/4 Ceftriaxone >>  12/4 Vancomycin >>   Dose adjustments this admission: N/a  Microbiology results: 12/4 BCx x2:  ngtd 12/4  covid neg, flu neg   Plan:  -Continue Vancomycin 750mg  IV q12h - Goal trough 10-15  -Monitor clinical progress, renal function. Check steady state vancomycin trough per protocol if needed.  - De-escalate ABX when appropriate   Thank you for allowing pharmacy to be a part of this patient's care.  Nicole Cella, RPh Clinical Pharmacist 07/10/2020 12:25 PM

## 2020-07-10 NOTE — Progress Notes (Addendum)
Progress Note  Chief Complaint:    anemia     ASSESSMENT / PLAN:    74 yo male PMH not limited to  OSA, DVTs on Eliquis, PVD, obesity, COVID 19, HTN, hyperlipidemia, Afib, colon polyps.. Admitted new Afib, acute on chronic heart failure and sepsis secondary to BLE cellulitis. Afib /RVR. We have been following inpatient for new onset anemia and Heme positive stool. Cardiology following and plans for cardioversion but awaiting GI evaluation.  --Clear liquids today, NPO after MN for EGD / colonoscopy tomorrow. Reviewed the risks and benefits of EGD and colonoscopy with possible polypectomy / biopsies and the patient agrees to proceed.  --Will hold IV heparin at 5 am for 9 am endoscopic procedures. I will put order in to hold and also let PharmD know.   # Hx of DVT, Eliquis on hold.   # New Afib with RVR. On Amiodarone drip and IV heparin.   # Acute on chronic heart failure, precipitated by Afib / RVR    Attending Physician Note   I have taken an interval history, reviewed the chart and examined the patient. I agree with the Advanced Practitioner's note, impression and recommendations.   *Anemia, heme positive stool on Eliquis. Eliquis on hold. Proceed with colonoscopy and EGD tomorrow. Pt agrees to proceed understanding that he is higher risk for procedure and sedation related complications. The risks (including bleeding, perforation, infection, missed lesions, medication reactions and possible hospitalization or surgery if complications occur), benefits, and alternatives to colonoscopy with possible biopsy and possible polypectomy were discussed with the patient and they consent to proceed. The risks (including bleeding, perforation, infection, missed lesions, medication reactions and possible hospitalization or surgery if complications occur), benefits, and alternatives to endoscopy with possible biopsy and possible dilation were discussed with the patient and they consent to proceed.     *Afib with RVR on Amiodarone. Hold heparin 4 hours prior to procedures tomorrow.   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   *OSA on CPAP  Lucio Edward, MD [facility]       SUBJECTIVE:   No complaints.     OBJECTIVE:    Scheduled inpatient medications:  . furosemide  40 mg Intravenous BID  . gatifloxacin  1 drop Left Eye QID  . magnesium oxide  400 mg Oral Daily  . pantoprazole  40 mg Oral Daily  . potassium chloride  40 mEq Oral BID  . sodium chloride flush  3 mL Intravenous Q12H  . triamcinolone  1 application Topical BID  . vitamin B-12  1,000 mcg Oral Daily   Continuous inpatient infusions:  . sodium chloride 250 mL (07/08/20 1514)  . amiodarone 30 mg/hr (07/10/20 0101)  . cefTRIAXone (ROCEPHIN)  IV 2 g (07/09/20 1618)  . heparin 1,750 Units/hr (07/10/20 1610)  . vancomycin 750 mg (07/10/20 0604)   PRN inpatient medications: sodium chloride, acetaminophen, ondansetron (ZOFRAN) IV, sodium chloride flush  Vital signs in last 24 hours: Temp:  [98.5 F (36.9 C)-98.6 F (37 C)] 98.5 F (36.9 C) (12/07 1145) Pulse Rate:  [133-138] 138 (12/07 1145) Resp:  [18-20] 18 (12/07 1145) BP: (102-130)/(75-87) 119/87 (12/07 1145) SpO2:  [97 %-100 %] 99 % (12/07 1145) Weight:  [136.6 kg] 136.6 kg (12/07 0609) Last BM Date: 07/08/20  Intake/Output Summary (Last 24 hours) at 07/10/2020 1202 Last data filed at 07/10/2020 1148 Gross per 24 hour  Intake 1660.75 ml  Output 1475 ml  Net 185.75 ml     Physical Exam:  . General: Alert male in NAD . Heart:  Tachycardic, irregular rhythm . Pulmonary: Normal respiratory effort . Abdomen: Soft, nondistended, nontender. Normal bowel sounds.  . Neurologic: Alert and oriented . Psych: Pleasant. Cooperative.   Filed Weights   07/08/20 0422 07/09/20 0532 07/10/20 0609  Weight: (!) 139.1 kg (!) 138.9 kg (!) 136.6 kg    Intake/Output from  previous day: 12/06 0701 - 12/07 0700 In: 1560.8 [P.O.:580; I.V.:580.8; IV Piggyback:400] Out: 1325 [Urine:1325] Intake/Output this shift: Total I/O In: 220 [P.O.:220] Out: 550 [Urine:550]    Lab Results: Recent Labs    07/07/20 1505 07/09/20 0553 07/10/20 0155  WBC 13.7* 7.5 7.2  HGB 10.6* 9.5* 9.3*  HCT 36.5* 31.4* 29.5*  PLT 272 207 207   BMET Recent Labs    07/08/20 0143 07/09/20 0553 07/10/20 0155  NA 136 136 136  K 4.0 3.2* 3.4*  CL 99 97* 97*  CO2 25 29 28   GLUCOSE 125* 100* 97  BUN 15 17 17   CREATININE 0.92 1.10 1.08  CALCIUM 8.9 8.8* 8.8*   LFT Recent Labs    07/07/20 1505  PROT 6.7  ALBUMIN 3.2*  AST 22  ALT 18  ALKPHOS 74  BILITOT 0.9   PT/INR Recent Labs    07/07/20 1505  LABPROT 15.3*  INR 1.3*   Hepatitis Panel No results for input(s): HEPBSAG, HCVAB, HEPAIGM, HEPBIGM in the last 72 hours.  No results found.    Active Problems:   A-fib ([place])     LOS: 3 days   Tye Savoy ,NP 07/10/2020, 12:02 PM

## 2020-07-10 NOTE — Progress Notes (Signed)
Does not want at this time.

## 2020-07-10 NOTE — Progress Notes (Signed)
ANTICOAGULATION CONSULT NOTE - Follow Up Consult  Pharmacy Consult for heparin Indication: Afib/DVT  Labs: Recent Labs    07/08/20 0143 07/08/20 1909 07/08/20 1909 07/09/20 0553 07/09/20 1556 07/10/20 0155 07/10/20 1110 07/10/20 2018  HGB  --   --   --  9.5*  --  9.3*  --   --   HCT  --   --   --  31.4*  --  29.5*  --   --   PLT  --   --   --  207  --  207  --   --   APTT  --  39*   < > 54*   < > 51* 49* 74*  HEPARINUNFRC  --  >2.20*  --  >2.20*  --  1.38*  --   --   CREATININE 0.92  --   --  1.10  --  1.08  --   --    < > = values in this interval not displayed.    Assessment/Plan:  74yo male therapeutic on heparin after rate change. Will continue gtt at current rate until off in am for procedure.  Wynona Neat, PharmD, BCPS  07/10/2020,9:07 PM

## 2020-07-10 NOTE — Progress Notes (Signed)
Received report from Mio, RN and assumed care for pt at this time.

## 2020-07-11 ENCOUNTER — Inpatient Hospital Stay (HOSPITAL_COMMUNITY): Payer: Medicare Other

## 2020-07-11 ENCOUNTER — Other Ambulatory Visit: Payer: Self-pay

## 2020-07-11 ENCOUNTER — Inpatient Hospital Stay (HOSPITAL_COMMUNITY): Payer: Medicare Other | Admitting: Certified Registered"

## 2020-07-11 ENCOUNTER — Encounter (HOSPITAL_COMMUNITY): Payer: Self-pay | Admitting: Internal Medicine

## 2020-07-11 ENCOUNTER — Other Ambulatory Visit: Payer: Self-pay | Admitting: Physician Assistant

## 2020-07-11 ENCOUNTER — Encounter (HOSPITAL_COMMUNITY)
Admission: EM | Disposition: A | Discharge status: Home / Self Care | Payer: Self-pay | Source: Home / Self Care | Attending: Internal Medicine

## 2020-07-11 DIAGNOSIS — D509 Iron deficiency anemia, unspecified: Secondary | ICD-10-CM

## 2020-07-11 DIAGNOSIS — I5031 Acute diastolic (congestive) heart failure: Secondary | ICD-10-CM | POA: Diagnosis not present

## 2020-07-11 DIAGNOSIS — R195 Other fecal abnormalities: Secondary | ICD-10-CM

## 2020-07-11 DIAGNOSIS — K2970 Gastritis, unspecified, without bleeding: Secondary | ICD-10-CM

## 2020-07-11 HISTORY — PX: BIOPSY: SHX5522

## 2020-07-11 HISTORY — PX: COLONOSCOPY WITH PROPOFOL: SHX5780

## 2020-07-11 HISTORY — PX: ESOPHAGOGASTRODUODENOSCOPY (EGD) WITH PROPOFOL: SHX5813

## 2020-07-11 LAB — BASIC METABOLIC PANEL
Anion gap: 12 (ref 5–15)
BUN: 14 mg/dL (ref 8–23)
CO2: 30 mmol/L (ref 22–32)
Calcium: 9.1 mg/dL (ref 8.9–10.3)
Chloride: 96 mmol/L — ABNORMAL LOW (ref 98–111)
Creatinine, Ser: 1.06 mg/dL (ref 0.61–1.24)
GFR, Estimated: >60 mL/min (ref >60)
Glucose, Bld: 103 mg/dL — ABNORMAL HIGH (ref 70–99)
Potassium: 3.7 mmol/L (ref 3.5–5.1)
Sodium: 138 mmol/L (ref 135–145)

## 2020-07-11 LAB — CBC WITH DIFFERENTIAL/PLATELET
Abs Immature Granulocytes: 0.05 10*3/uL (ref 0.00–0.07)
Basophils Absolute: 0.1 10*3/uL (ref 0.0–0.1)
Basophils Relative: 1 %
Eosinophils Absolute: 0.3 10*3/uL (ref 0.0–0.5)
Eosinophils Relative: 4 %
HCT: 32.3 % — ABNORMAL LOW (ref 39.0–52.0)
Hemoglobin: 9.8 g/dL — ABNORMAL LOW (ref 13.0–17.0)
Immature Granulocytes: 1 %
Lymphocytes Relative: 20 %
Lymphs Abs: 1.6 10*3/uL (ref 0.7–4.0)
MCH: 28 pg (ref 26.0–34.0)
MCHC: 30.3 g/dL (ref 30.0–36.0)
MCV: 92.3 fL (ref 80.0–100.0)
Monocytes Absolute: 1 10*3/uL (ref 0.1–1.0)
Monocytes Relative: 13 %
Neutro Abs: 4.8 10*3/uL (ref 1.7–7.7)
Neutrophils Relative %: 61 %
Platelets: 235 10*3/uL (ref 150–400)
RBC: 3.5 MIL/uL — ABNORMAL LOW (ref 4.22–5.81)
RDW: 14.9 % (ref 11.5–15.5)
WBC: 7.8 10*3/uL (ref 4.0–10.5)
nRBC: 0 % (ref 0.0–0.2)

## 2020-07-11 LAB — ECHOCARDIOGRAM COMPLETE
Height: 67 in
S' Lateral: 2.8 cm
Weight: 4638.4 oz

## 2020-07-11 LAB — HEPARIN LEVEL (UNFRACTIONATED): Heparin Unfractionated: 1.02 IU/mL — ABNORMAL HIGH (ref 0.30–0.70)

## 2020-07-11 LAB — APTT: aPTT: 64 seconds — ABNORMAL HIGH (ref 24–36)

## 2020-07-11 SURGERY — COLONOSCOPY WITH PROPOFOL
Anesthesia: General

## 2020-07-11 MED ORDER — DEXAMETHASONE SODIUM PHOSPHATE 10 MG/ML IJ SOLN
INTRAMUSCULAR | Status: DC | PRN
  Administered 2020-07-11: 10 mg via INTRAVENOUS

## 2020-07-11 MED ORDER — LIDOCAINE VISCOUS HCL 2 % MT SOLN
OROMUCOSAL | Status: AC
  Filled 2020-07-11: qty 15

## 2020-07-11 MED ORDER — PROPOFOL 10 MG/ML IV BOLUS
INTRAVENOUS | Status: DC | PRN
  Administered 2020-07-11: 200 mg via INTRAVENOUS

## 2020-07-11 MED ORDER — SUCCINYLCHOLINE CHLORIDE 200 MG/10ML IV SOSY
PREFILLED_SYRINGE | INTRAVENOUS | Status: DC | PRN
  Administered 2020-07-11: 140 mg via INTRAVENOUS

## 2020-07-11 MED ORDER — PHENYLEPHRINE 40 MCG/ML (10ML) SYRINGE FOR IV PUSH (FOR BLOOD PRESSURE SUPPORT)
PREFILLED_SYRINGE | INTRAVENOUS | Status: DC | PRN
  Administered 2020-07-11: 120 ug via INTRAVENOUS
  Administered 2020-07-11: 80 ug via INTRAVENOUS
  Administered 2020-07-11 (×5): 120 ug via INTRAVENOUS

## 2020-07-11 MED ORDER — HEPARIN (PORCINE) 25000 UT/250ML-% IV SOLN
2100.0000 [IU]/h | INTRAVENOUS | Status: DC
  Administered 2020-07-11 – 2020-07-12 (×2): 2100 [IU]/h via INTRAVENOUS
  Filled 2020-07-11: qty 250

## 2020-07-11 MED ORDER — FERROUS SULFATE 325 (65 FE) MG PO TABS
325.0000 mg | ORAL_TABLET | Freq: Two times a day (BID) | ORAL | Status: DC
  Administered 2020-07-11 – 2020-07-14 (×5): 325 mg via ORAL
  Filled 2020-07-11 (×6): qty 1

## 2020-07-11 MED ORDER — ALBUMIN HUMAN 5 % IV SOLN: INTRAVENOUS | Status: DC | PRN

## 2020-07-11 MED ORDER — PERFLUTREN LIPID MICROSPHERE
1.0000 mL | INTRAVENOUS | Status: AC | PRN
  Administered 2020-07-11: 2 mL via INTRAVENOUS
  Filled 2020-07-11: qty 10

## 2020-07-11 MED ORDER — KETAMINE HCL 50 MG/5ML IJ SOSY
PREFILLED_SYRINGE | INTRAMUSCULAR | Status: AC
  Filled 2020-07-11: qty 10

## 2020-07-11 MED ORDER — LIDOCAINE 2% (20 MG/ML) 5 ML SYRINGE
INTRAMUSCULAR | Status: DC | PRN
  Administered 2020-07-11: 60 mg via INTRAVENOUS

## 2020-07-11 MED ORDER — FENTANYL CITRATE (PF) 100 MCG/2ML IJ SOLN
INTRAMUSCULAR | Status: DC | PRN
  Administered 2020-07-11: 50 ug via INTRAVENOUS

## 2020-07-11 MED ORDER — PHENYLEPHRINE HCL-NACL 10-0.9 MG/250ML-% IV SOLN
INTRAVENOUS | Status: DC | PRN
  Administered 2020-07-11: 60 ug/min via INTRAVENOUS

## 2020-07-11 MED ORDER — DEXMEDETOMIDINE (PRECEDEX) IN NS 20 MCG/5ML (4 MCG/ML) IV SYRINGE
PREFILLED_SYRINGE | INTRAVENOUS | Status: DC | PRN
  Administered 2020-07-11: 8 ug via INTRAVENOUS

## 2020-07-11 MED ORDER — ONDANSETRON HCL 4 MG/2ML IJ SOLN
INTRAMUSCULAR | Status: DC | PRN
  Administered 2020-07-11: 4 mg via INTRAVENOUS

## 2020-07-11 MED ORDER — LACTATED RINGERS IV SOLN: INTRAVENOUS | Status: DC | PRN

## 2020-07-11 SURGICAL SUPPLY — 25 items

## 2020-07-11 NOTE — Progress Notes (Signed)
Pt HR sustaining in the 130s despite receiving scheduled and PRN meds. On call cardiologist paged. No orders placed.

## 2020-07-11 NOTE — Op Note (Addendum)
[facility] Patient Name: Shane Diaz Procedure Date : 07/11/2020 MRN: [id number] Attending MD: Ladene Artist , MD Date of Birth: 1945-10-12 CSN: [id number] Age: 74 Admit Type: Inpatient Procedure:                Upper GI endoscopy Indications:              Unexplained iron deficiency anemia, Heme positive                            stool Providers:                Pricilla Riffle. Fuller Plan, MD, Laverda Sorenson, Technician,                            Erenest Rasher, RN, Lesia Sago, Technician Referring MD:             [facility] Medicines:                General Anesthesia Complications:            No immediate complications. Estimated Blood Loss:     Estimated blood loss was minimal. Procedure:                Pre-Anesthesia Assessment:                           - Prior to the procedure, a History and Physical                            was performed, and patient medications and                            allergies were reviewed. The patient's tolerance of                            previous anesthesia was also reviewed. The risks                            and benefits of the procedure and the sedation                            options and risks were discussed with the patient.                            All questions were answered, and informed consent                            was obtained. Prior Anticoagulants: The patient has                            taken Eliquis (apixaban), last dose was 2 days                            prior to procedure and IV heparin stopped 4 hours  prior. ASA Grade Assessment: III - A patient with                            severe systemic disease. After reviewing the risks                            and benefits, the patient was deemed in                            satisfactory condition to undergo the procedure.                           After obtaining informed consent, the endoscope was                             passed under direct vision. Throughout the                            procedure, the patient's blood pressure, pulse, and                            oxygen saturations were monitored continuously. The                            GIF-H190 ([id number] Olympus gastroscope was                            introduced through the mouth, and advanced to the                            second part of duodenum. The upper GI endoscopy was                            accomplished without difficulty. The patient                            tolerated the procedure well. Scope In: Scope Out: Findings:      The examined esophagus was normal.      Patchy mild inflammation characterized by erythema and granularity was       found in the gastric body and in the gastric antrum. Biopsies were taken       with a cold forceps for histology.      The exam of the stomach was otherwise normal.      The duodenal bulb and second portion of the duodenum were normal.       Biopsies for histology were taken with a cold forceps for evaluation of       celiac disease. Impression:               - Normal esophagus.                           - Gastritis. Biopsied.                           - Normal  duodenal bulb and second portion of the                            duodenum. Biopsied. Recommendation:           - Return patient to hospital ward for ongoing care.                           - Resume previous diet.                           - Continue present medications.                           - FeSO4 325 mg po bid with meals for 2-3 months.                           - OK to resume Eliquis (apixaban) at prior dose                            tomorrow as indicated. OK to resume IV heparin in 6                            hours as indicated. Refer to managing physician for                            further adjustment of therapy.                           - Await pathology results.                           - If anemia, occult GI  bleeding recurs consider                            outpatient VCE.                           - Outpatient follow with PCP for mgmt of anemia.                           - Outpatient GI follow up with Dr. Oretha Caprice as                            needed.                           - GI signing off. Please call if needed. Procedure Code(s):        --- Professional ---                           [phone], Esophagogastroduodenoscopy, flexible,                            transoral; with biopsy, single or multiple Diagnosis Code(s):        ---  Professional ---                           K29.70, Gastritis, unspecified, without bleeding                           D50.9, Iron deficiency anemia, unspecified                           R19.5, Other fecal abnormalities CPT copyright 2019 [employer] Association. All rights reserved. The codes documented in this report are preliminary and upon coder review may  be revised to meet current compliance requirements. Ladene Artist, MD 07/11/2020 10:12:03 AM This report has been signed electronically. Number of Addenda: 0

## 2020-07-11 NOTE — Progress Notes (Signed)
PT Cancellation Note  Patient Details Name: Shane Diaz MRN: [id number] DOB: 1946/05/12   Cancelled Treatment:    Reason Eval/Treat Not Completed: (P) Patient at procedure or test/unavailable (Pt off unit for EGD, will f/u per POC.)   Khamila Bassinger Eli Hose 07/11/2020, 9:42 AM  Erasmo Leventhal , PTA Acute Rehabilitation Services Pager [phone] Office [phone]

## 2020-07-11 NOTE — Progress Notes (Signed)
ANTICOAGULATION CONSULT NOTE - Follow Up Consult  Pharmacy Consult for Heparin Indication: atrial fibrillation  No Known Allergies  Patient Measurements: Height: 5\' 7"  (170.2 cm) Weight: 131.5 kg (289 lb 14.4 oz) IBW/kg (Calculated) : 66.1 Heparin Dosing Weight:  93.2 kg  Vital Signs: Temp: 97.9 F (36.6 C) (12/08 1112) Temp Source: Oral (12/08 1112) BP: 114/80 (12/08 1112) Pulse Rate: 124 (12/08 1112)  Labs: Recent Labs    07/09/20 0553 07/09/20 1556 07/10/20 0155 07/10/20 0155 07/10/20 1110 07/10/20 2018 07/11/20 0134  HGB 9.5*  --  9.3*  --   --   --  9.8*  HCT 31.4*  --  29.5*  --   --   --  32.3*  PLT 207  --  207  --   --   --  235  APTT 54*   < > 51*   < > 49* 74* 64*  HEPARINUNFRC >2.20*  --  1.38*  --   --  0.98* 1.02*  CREATININE 1.10  --  1.08  --   --   --  1.06   < > = values in this interval not displayed.    Estimated Creatinine Clearance: 79.8 mL/min (by C-G formula based on SCr of 1.06 mg/dL).  Assessment: Anticoag:  AFib with RVR. Apix PTA (LD 12/5)  for afib and hx bilateral DVT>> hold apix switch to IV heparin, plan for endoscopy Wed 12/8> resume heparin after 6 hrs and Eliquis on 12/9. -acute Hgb drop 17 outpt > 10, heme-positive., concern for GIB.  - aPTT 64 slightly low this AM. Hgb 9.8 stable. Plts 235 WNL today. If no GIB, cards plans  3-4 weeks of anticoagulation and cardioversion  Goal of Therapy:  aPTT 66-85 seconds Monitor platelets by anticoagulation protocol: Yes   Plan:  - May resume heparin (after 6 hrs per GI note)  at 2100 units/hr. - Daily aPTT, HL, and CBC - Holding Apix. May resume per GI on 12/9. - Will aim for lower goal d/t concern for GIB. PTT goal 66-85, HL 0.3-0.5   Shane Diaz S. Alford Highland, PharmD, BCPS Clinical Staff Pharmacist [email or web address] Alford Highland, [employer] 07/11/2020,12:58 PM

## 2020-07-11 NOTE — Progress Notes (Signed)
  Echocardiogram 2D Echocardiogram has been performed.  Jennette Dubin 07/11/2020, 2:11 PM

## 2020-07-11 NOTE — Progress Notes (Signed)
Pt refused second dose of bowl regimen. Educated patient on the need to finish the bowel prep. He still refused.

## 2020-07-11 NOTE — Progress Notes (Signed)
PROGRESS NOTE  Shane Diaz  DOB: 09-Nov-1945  PCP: [name], MD NGE:[id number]  DOA: 07/07/2020  LOS: 4 days   Chief Complaint  Patient presents with  . Chest Pain  . Shortness of Breath  . Leg Swelling   Brief narrative: Shane Diaz is a 74 y.o. 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 On 07/07/2020, patient presented to the ED with complaint of acute onset left-sided chest pain and shortness of breath.  Over the past few months, patient has noticed that his heart rate is often in the 130s to 150s range, otherwise asymptomatic. He was recently admitted (10/4-10/7) for generalized weakness, found to be in A. fib with RVR, responded to Cardizem drip and was discharged home on low-dose beta-blocker.  During that hospitalization, he was also found to have Covid pneumonia, received standard treatment and was discharged on 2 L oxygen. Patient lives at home with his wife, and is fairly sedentary in the house.  In the ED, patient had a temperature, tachycardic to 150s, tachypneic in 20s, required 3 L oxygen by nasal cannula. EKG showed A. fib with RVR.  Patient was started on Cardizem drip. He also had acute worsening chronic bilateral lower extremity edema. Initial lactic acid level was elevated to 2.8 subsequently down to 1.4 after IV fluid. Patient was admitted to hospitalist service.   Cardiology and GI service following.  Subjective: Patient was seen and examined this afternoon. Underwent EGD and colonoscopy today.  Assessment/Plan: Sepsis secondary to bilateral lower extremity cellulitis -WBC and lactic acid level have improved. -Switch from broad-spectrum antibiotics to oral Augmentin today.  A. fib with RVR -Presented with shortness of breath RVR to 150s.   -On amiodarone drip and heparin drip.  Eliquis on hold.   -Patient is planned for cardioversion once GI work-up is  complete.  Acute exacerbation of chronic diastolic CHF -Has significant bilateral pedal edema. -Heart failure probably precipitated by RVR. -Underwent echocardiogram this afternoon. -Currently on IV Lasix 40 mg twice daily. -Net negative balance of 1 L in last 24 hours. -Continue to monitor for daily intake output, weight, blood pressure, BNP, renal function and electrolytes. Recent Labs  Lab 07/07/20 1505 07/08/20 0143 07/09/20 0553 07/10/20 0155 07/11/20 0134  BNP 276.1*  --   --   --   --   BUN 15 15 17 17 14   CREATININE 0.94 0.92 1.10 1.08 1.06  K 4.2 4.0 3.2* 3.4* 3.7  MG  --   --  1.6* 1.8  --    Hypokalemia/hypomagnesemia -Potassium and magnesium level improved with replacement.  Recheck.  Acute anemia -Noted a drop in hemoglobin from 17 to 10.6 in a span of 2 months. On Eliquis. FOBT positive. -GI consult appreciated.  -12/8, patient underwent EGD and colonoscopy which showed normal esophagus, gastritis, normal duodenal bulb moderate diverticulosis of left colon, internal hemorrhoids.  No evidence of bleeding. -Recommendation is to continue iron supplements.  Morbid obesity - Body mass index is 45.4 kg/m. Patient has been advised to make an attempt to improve diet and exercise patterns to aid in weight loss.  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  Significant oozing at the time of dressing change per RN. -Expected to improve with IV Lasix, leg elevation. -Wound  care nurse consult appreciated.  Recommendation is to clean his legs with soap and water and pat dry, apply Xeroform, cover with ABD pad and secure with Kerlix and Ace wrap.  Dressings are changed daily.  Mobility: Limited mobility in the house.  PT eval obtained Code Status:   Code Status: DNR  Nutritional  status: Body mass index is 45.4 kg/m.     Diet Order            Diet Heart Room service appropriate? Yes; Fluid consistency: Thin  Diet effective now                 DVT prophylaxis: Heparin drip Antimicrobials:  Augmentin Fluid: No need of IV fluid Consultants: Cardiology, GI Family Communication:  None at bedside  Status is: Inpatient  Remains inpatient appropriate because -pending cardioversion  Dispo: The patient is from: Home              Anticipated d/c is to: Home likely              Anticipated d/c date is: 1 to 2 days              Patient currently is not medically stable to d/c.   Infusions:  . sodium chloride 250 mL (07/08/20 1514)  . amiodarone 30 mg/hr (07/11/20 1227)  . heparin      Scheduled Meds: . amoxicillin-clavulanate  1 tablet Oral Q12H  . ferrous sulfate  325 mg Oral BID WC  . furosemide  40 mg Intravenous BID  . gatifloxacin  1 drop Left Eye QID  . magnesium oxide  400 mg Oral Daily  . metoprolol tartrate  25 mg Oral BID  . pantoprazole  40 mg Oral Daily  . potassium chloride  40 mEq Oral BID  . sodium chloride flush  3 mL Intravenous Q12H  . triamcinolone  1 application Topical BID  . vitamin B-12  1,000 mcg Oral Daily    Antimicrobials: Anti-infectives (From admission, onward)   Start     Dose/Rate Route Frequency Ordered Stop   07/10/20 1530  amoxicillin-clavulanate (AUGMENTIN) 875-125 MG per tablet 1 tablet        1 tablet Oral Every 12 hours 07/10/20 1443     07/08/20 1600  cefTRIAXone (ROCEPHIN) 2 g in sodium chloride 0.9 % 100 mL IVPB  Status:  Discontinued        2 g 200 mL/hr over 30 Minutes Intravenous Every 24 hours 07/07/20 1931 07/10/20 1443   07/08/20 0430  vancomycin (VANCOREADY) IVPB 750 mg/150 mL  Status:  Discontinued        750 mg 150 mL/hr over 60 Minutes Intravenous Every 12 hours 07/07/20 1632 07/10/20 1443   07/07/20 1545  vancomycin (VANCOCIN) IVPB 1000 mg/200 mL premix  Status:  Discontinued        1,000  mg 200 mL/hr over 60 Minutes Intravenous  Once 07/07/20 1531 07/07/20 1538   07/07/20 1545  cefTRIAXone (ROCEPHIN) 2 g in sodium chloride 0.9 % 100 mL IVPB        2 g 200 mL/hr over 30 Minutes Intravenous  Once 07/07/20 1531 07/07/20 1624   07/07/20 1545  vancomycin (VANCOREADY) IVPB 2000 mg/400 mL        2,000 mg 200 mL/hr over 120 Minutes Intravenous  Once 07/07/20 1538 07/07/20 2002      PRN meds: sodium chloride, acetaminophen, metoprolol tartrate, ondansetron (ZOFRAN) IV, perflutren lipid microspheres (DEFINITY) IV suspension, sodium chloride flush   Objective:  Vitals:   07/11/20 1038 07/11/20 1112  BP: 112/81 114/80  Pulse: (!) 123 (!) 124  Resp: (!) 25 18  Temp:  97.9 F (36.6 C)  SpO2: 98% 91%    Intake/Output Summary (Last 24 hours) at 07/11/2020 1506 Last data filed at 07/11/2020 1017 Gross per 24 hour  Intake 1480.26 ml  Output 600 ml  Net 880.26 ml   Filed Weights   07/09/20 0532 07/10/20 0609 07/11/20 0508  Weight: (!) 138.9 kg (!) 136.6 kg 131.5 kg   Weight change: -5.102 kg Body mass index is 45.4 kg/m.   Physical Exam: General exam: Pleasant elderly Caucasian male.  Morbidly obese.  Not in distress Skin: No rashes, lesions or ulcers. HEENT: Atraumatic, normocephalic, no obvious bleeding Lungs: Clear to auscultation bilaterally CVS: Tachycardic, no murmur GI/Abd soft, nontender, distended from obesity, bowel sound present CNS: Alert, awake, oriented x3 Psychiatry: Mood appropriate Extremities: Continues to have 2-3+ pedal edema bilaterally with oozing of serous fluid.  Data Review: I have personally reviewed the laboratory data and studies available.  Recent Labs  Lab 07/07/20 1505 07/09/20 0553 07/10/20 0155 07/11/20 0134  WBC 13.7* 7.5 7.2 7.8  NEUTROABS 9.6* 4.9 4.7 4.8  HGB 10.6* 9.5* 9.3* 9.8*  HCT 36.5* 31.4* 29.5* 32.3*  MCV 97.9 94.3 91.6 92.3  PLT 272 207 207 235   Recent Labs  Lab 07/07/20 1505 07/08/20 0143 07/09/20 0553  07/10/20 0155 07/11/20 0134  NA 137 136 136 136 138  K 4.2 4.0 3.2* 3.4* 3.7  CL 99 99 97* 97* 96*  CO2 25 25 29 28 30   GLUCOSE 122* 125* 100* 97 103*  BUN 15 15 17 17 14   CREATININE 0.94 0.92 1.10 1.08 1.06  CALCIUM 9.2 8.9 8.8* 8.8* 9.1  MG  --   --  1.6* 1.8  --   PHOS  --   --  3.2  --   --     F/u labs ordered.  Signed, Terrilee Croak, MD [facility] 07/11/2020

## 2020-07-11 NOTE — Anesthesia Postprocedure Evaluation (Signed)
Anesthesia Post Note  Patient: Shane Diaz  Procedure(s) Performed: COLONOSCOPY WITH PROPOFOL (N/A ) ESOPHAGOGASTRODUODENOSCOPY (EGD) WITH PROPOFOL (N/A ) BIOPSY     Patient location during evaluation: Endoscopy Anesthesia Type: General Level of consciousness: awake and alert Pain management: pain level controlled Vital Signs Assessment: post-procedure vital signs reviewed and stable Respiratory status: spontaneous breathing, nonlabored ventilation, respiratory function stable and patient connected to nasal cannula oxygen Cardiovascular status: blood pressure returned to baseline and stable Postop Assessment: no apparent nausea or vomiting Anesthetic complications: no   No complications documented.  Last Vitals:  Vitals:   07/11/20 1038 07/11/20 1112  BP: 112/81 114/80  Pulse: (!) 123 (!) 124  Resp: (!) 25 18  Temp:  36.6 C  SpO2: 98% 91%    Last Pain:  Vitals:   07/11/20 1112  TempSrc: Oral  PainSc:                  Barnet Glasgow

## 2020-07-11 NOTE — Anesthesia Procedure Notes (Signed)
Procedure Name: Intubation Date/Time: 07/11/2020 9:13 AM Performed by: Gwyndolyn Saxon, CRNA Pre-anesthesia Checklist: Patient identified, Emergency Drugs available, Suction available and Patient being monitored Patient Re-evaluated:Patient Re-evaluated prior to induction Oxygen Delivery Method: Circle system utilized Preoxygenation: Pre-oxygenation with 100% oxygen Induction Type: IV induction Ventilation: Mask ventilation without difficulty Laryngoscope Size: Miller and 2 Grade View: Grade I Tube type: Oral Tube size: 7.5 mm Number of attempts: 1 Airway Equipment and Method: Patient positioned with wedge pillow and Stylet Placement Confirmation: ETT inserted through vocal cords under direct vision,  positive ETCO2 and breath sounds checked- equal and bilateral Secured at: 24 cm Tube secured with: Tape Dental Injury: Teeth and Oropharynx as per pre-operative assessment  Comments: Pt with very large neck and tongue; Mallampati II. Easy mask without oral airway.

## 2020-07-11 NOTE — Transfer of Care (Signed)
Immediate Anesthesia Transfer of Care Note  Patient: Shane Diaz  Procedure(s) Performed: COLONOSCOPY WITH PROPOFOL (N/A ) ESOPHAGOGASTRODUODENOSCOPY (EGD) WITH PROPOFOL (N/A ) BIOPSY  Patient Location: PACU  Anesthesia Type:General  Level of Consciousness: drowsy and patient cooperative  Airway & Oxygen Therapy: Patient Spontanous Breathing and Patient connected to face mask oxygen  Post-op Assessment: Report given to RN and Post -op Vital signs reviewed and stable  Post vital signs: Reviewed and stable  Last Vitals:  Vitals Value Taken Time  BP    Temp    Pulse    Resp    SpO2      Last Pain:  Vitals:   07/11/20 0819  TempSrc: Axillary  PainSc: 0-No pain         Complications: No complications documented.

## 2020-07-11 NOTE — Interval H&P Note (Signed)
History and Physical Interval Note:  07/11/2020 9:04 AM  Shane Diaz  has presented today for surgery, with the diagnosis of Anemia, FOBT positive, TA and TVA polyps 2015, no surveillance colonoscopy since then.  The various methods of treatment have been discussed with the patient and family. After consideration of risks, benefits and other options for treatment, the patient has consented to  Procedure(s): COLONOSCOPY WITH PROPOFOL (N/A) ESOPHAGOGASTRODUODENOSCOPY (EGD) WITH PROPOFOL (N/A) as a surgical intervention.  The patient's history has been reviewed, patient examined, no change in status, stable for surgery.  I have reviewed the patient's chart and labs.  Questions were answered to the patient's satisfaction.     Pricilla Riffle. Fuller Plan

## 2020-07-11 NOTE — Op Note (Addendum)
[facility] Patient Name: Shane Diaz Procedure Date : 07/11/2020 MRN: [id number] Attending MD: Ladene Artist , MD Date of Birth: [birth year] CSN: [id number] Age: 74 Admit Type: Inpatient Procedure:                Colonoscopy Indications:              Heme positive stool, Unexplained iron deficiency                            anemia Providers:                Pricilla Riffle. Fuller Plan, MD, Laverda Sorenson, Technician,                            Erenest Rasher, RN, Lesia Sago, Technician,                            Nicholes Calamity, CRNA Referring MD:             [facility] Medicines:                General Anesthesia Complications:            No immediate complications. Estimated blood loss:                            None. Estimated Blood Loss:     Estimated blood loss: none. Procedure:                Pre-Anesthesia Assessment:                           - Prior to the procedure, a History and Physical                            was performed, and patient medications and                            allergies were reviewed. The patient's tolerance of                            previous anesthesia was also reviewed. The risks                            and benefits of the procedure and the sedation                            options and risks were discussed with the patient.                            All questions were answered, and informed consent                            was obtained. Prior Anticoagulants: The patient has                            taken Eliquis (apixaban), last dose was 2 days  prior to procedure and IV heparin stopped 4 hours                            prior. ASA Grade Assessment: III - A patient with                            severe systemic disease. After reviewing the risks                            and benefits, the patient was deemed in                            satisfactory condition to undergo the procedure.                            After obtaining informed consent, the colonoscope                            was passed under direct vision. Throughout the                            procedure, the patient's blood pressure, pulse, and                            oxygen saturations were monitored continuously. The                            CF-HQ190L ([id number]) Olympus colonoscope was                            introduced through the anus and advanced to the the                            cecum, identified by appendiceal orifice and                            ileocecal valve. The ileocecal valve, appendiceal                            orifice, and rectum were photographed. The quality                            of the bowel preparation was fair despite extensive                            lavage and suction. The patient declined the second                            dose of colonoscopy prep. The colonoscopy was                            performed without difficulty. The patient tolerated  the procedure well. Scope In: 9:25:59 AM Scope Out: 9:47:53 AM Scope Withdrawal Time: 0 hours 18 minutes 10 seconds  Total Procedure Duration: 0 hours 21 minutes 54 seconds  Findings:      The perianal and digital rectal examinations were normal.      Multiple medium-mouthed diverticula were found in the left colon. There       was no evidence of diverticular bleeding.      Internal hemorrhoids were found during retroflexion. The hemorrhoids       were moderate and Grade I (internal hemorrhoids that do not prolapse).      The exam was otherwise without abnormality on direct and retroflexion       views. Impression:               - Preparation of the colon was fair.                           - Moderate diverticulosis in the left colon. There                            was no evidence of diverticular bleeding.                           - Internal hemorrhoids.                           - The  examination was otherwise normal on direct                            and retroflexion views.                           - No specimens collected. Recommendation:           - Resume previous diet.                           - Continue present medications.                           - Proceed with EGD today as planned.                           - Outpatient GI follow up with Dr. Oretha Caprice as                            needed.                           - OK to resume Eliquis (apixaban) at prior dose                            tomorrow as indicated. OK to resume IV heparin in 6                            hours as indicated. Refer to managing physician for  further adjustment of therapy.                           - Return patient to hospital ward for ongoing care. Procedure Code(s):        --- Professional ---                           [phone], Colonoscopy, flexible; diagnostic, including                            collection of specimen(s) by brushing or washing,                            when performed (separate procedure) Diagnosis Code(s):        --- Professional ---                           K64.0, First degree hemorrhoids                           R19.5, Other fecal abnormalities                           D50.9, Iron deficiency anemia, unspecified                           K57.30, Diverticulosis of large intestine without                            perforation or abscess without bleeding CPT copyright 2019 [employer]. All rights reserved. The codes documented in this report are preliminary and upon coder review may  be revised to meet current compliance requirements. Ladene Artist, MD 07/11/2020 9:54:56 AM This report has been signed electronically. Number of Addenda: 0

## 2020-07-12 LAB — BASIC METABOLIC PANEL
Anion gap: 11 (ref 5–15)
BUN: 17 mg/dL (ref 8–23)
CO2: 30 mmol/L (ref 22–32)
Calcium: 9.2 mg/dL (ref 8.9–10.3)
Chloride: 94 mmol/L — ABNORMAL LOW (ref 98–111)
Creatinine, Ser: 1.21 mg/dL (ref 0.61–1.24)
GFR, Estimated: >60 mL/min (ref >60)
Glucose, Bld: 169 mg/dL — ABNORMAL HIGH (ref 70–99)
Potassium: 4.4 mmol/L (ref 3.5–5.1)
Sodium: 135 mmol/L (ref 135–145)

## 2020-07-12 LAB — CULTURE, BLOOD (ROUTINE X 2)
Culture: NO GROWTH
Culture: NO GROWTH

## 2020-07-12 LAB — HEPARIN LEVEL (UNFRACTIONATED): Heparin Unfractionated: 0.59 IU/mL (ref 0.30–0.70)

## 2020-07-12 LAB — APTT: aPTT: 60 seconds — ABNORMAL HIGH (ref 24–36)

## 2020-07-12 MED ORDER — METOPROLOL TARTRATE 50 MG PO TABS
50.0000 mg | ORAL_TABLET | Freq: Two times a day (BID) | ORAL | Status: DC
  Administered 2020-07-12 – 2020-07-14 (×3): 50 mg via ORAL
  Filled 2020-07-12 (×3): qty 1

## 2020-07-12 MED ORDER — APIXABAN 5 MG PO TABS
5.0000 mg | ORAL_TABLET | Freq: Two times a day (BID) | ORAL | Status: DC
  Administered 2020-07-12 – 2020-07-14 (×5): 5 mg via ORAL
  Filled 2020-07-12 (×5): qty 1

## 2020-07-12 MED ORDER — DILTIAZEM HCL 30 MG PO TABS
30.0000 mg | ORAL_TABLET | Freq: Four times a day (QID) | ORAL | Status: DC
  Administered 2020-07-12 – 2020-07-14 (×6): 30 mg via ORAL
  Filled 2020-07-12 (×8): qty 1

## 2020-07-12 MED ORDER — METOPROLOL TARTRATE 25 MG PO TABS
25.0000 mg | ORAL_TABLET | Freq: Once | ORAL | Status: AC
  Administered 2020-07-12: 25 mg via ORAL
  Filled 2020-07-12: qty 1

## 2020-07-12 NOTE — Progress Notes (Signed)
Leg dressings for bilateral cellulitis changed at 05:15.

## 2020-07-12 NOTE — Progress Notes (Signed)
   07/11/20 1900  Assess: MEWS Score  ECG Heart Rate (!) 129  Assess: MEWS Score  MEWS Temp 0  MEWS Systolic 0  MEWS Pulse 2  MEWS RR 0  MEWS LOC 0  MEWS Score 2  MEWS Score Color Yellow  Assess: if the MEWS score is Yellow or Red  Were vital signs taken at a resting state? Yes  Focused Assessment No change from prior assessment  Early Detection of Sepsis Score *See Row Information* Low  MEWS guidelines implemented *See Row Information* No, previously yellow, continue vital signs every 4 hours  Treat  MEWS Interventions Administered scheduled meds/treatments;Escalated (See documentation below)  Pain Scale 0-10  Pain Score 0  Escalate  MEWS: Escalate Yellow: discuss with charge nurse/RN and consider discussing with provider and RRT  Notify: Charge Nurse/RN  Name of Charge Nurse/RN Notified Jequetta  Date Charge Nurse/RN Notified 07/11/20  Time Charge Nurse/RN Notified 1940  Document  Patient Outcome Other (Comment)  Progress note created (see row info) Yes

## 2020-07-12 NOTE — Progress Notes (Signed)
ANTICOAGULATION CONSULT NOTE - Follow Up Consult  Pharmacy Consult for Heparin Indication: atrial fibrillation  No Known Allergies  Patient Measurements: Height: 5\' 7"  (170.2 cm) Weight: 134.5 kg (296 lb 8.3 oz) IBW/kg (Calculated) : 66.1  Vital Signs: Temp: 98.1 F (36.7 C) (12/09 0424) Temp Source: Oral (12/09 0424) BP: 126/87 (12/09 0800) Pulse Rate: 127 (12/09 0424)  Labs: Recent Labs    07/10/20 0155 07/10/20 1110 07/10/20 2018 07/11/20 0134 07/12/20 0354  HGB 9.3*  --   --  9.8*  --   HCT 29.5*  --   --  32.3*  --   PLT 207  --   --  235  --   APTT 51*   < > 74* 64* 60*  HEPARINUNFRC 1.38*  --  0.98* 1.02* 0.59  CREATININE 1.08  --   --  1.06 1.21   < > = values in this interval not displayed.    Estimated Creatinine Clearance: 70.8 mL/min (by C-G formula based on SCr of 1.21 mg/dL).  Assessment:  Anticoag: Apix PTA (LD 12/5)  for afib and hx bilateral DVT. Held apix>> IV heparin>> endoscopy Wed 12/8> resume heparin after 6 hrs and Eliquis on 12/9 per GI. However, wait to f/u cardiology plans - HL 0.59 now correlating and in goal range. D/c aPTTs. - If no GIB, cards plans  3-4 weeks of anticoagulation and cardioversion (12/7)  Goal of Therapy:  Heparin level 0.3-0.7 units/ml Monitor platelets by anticoagulation protocol: Yes   Plan:  - Con't IV heparin at 2100 units/hr. - Daily aPTT, HL, and CBC - Holding Eliquis until cards plans specified   Tyleigh Mahn S. Alford Highland, PharmD, BCPS Clinical Staff Pharmacist [email or web address] Alford Highland, Donnia Poplaski Stillinger 07/12/2020,8:41 AM

## 2020-07-12 NOTE — Progress Notes (Addendum)
Progress Note  Patient Name: Shane Diaz Date of Encounter: 07/12/2020  [facility] Cardiologist: Donato Heinz, MD   Subjective   74 year old gentleman with a history of obstructive sleep apnea, prediabetes morbid obesity.  He is admitted with worsening shortness of breath and was found to have atrial  flutter.  He has been found to have an acute drop in his hemoglobin from 17 down to 10 g. He is heme-positive. He has been seen by gastroenterology and is scheduled for upper and lower endoscopy. Our plan is to make sure that he is evaluated for possible GI bleeding before we commit him to cardioversion and 3 to 4 weeks of anticoagulation.   EGD with + gastritis ok to resume eliquis today  Colonoscopy with diverticulum without bleeding and internal hemorrhoids    Inpatient Medications    Scheduled Meds: . amoxicillin-clavulanate  1 tablet Oral Q12H  . ferrous sulfate  325 mg Oral BID WC  . furosemide  40 mg Intravenous BID  . gatifloxacin  1 drop Left Eye QID  . magnesium oxide  400 mg Oral Daily  . metoprolol tartrate  25 mg Oral BID  . pantoprazole  40 mg Oral Daily  . potassium chloride  40 mEq Oral BID  . sodium chloride flush  3 mL Intravenous Q12H  . triamcinolone  1 application Topical BID  . vitamin B-12  1,000 mcg Oral Daily   Continuous Infusions: . sodium chloride 250 mL (07/08/20 1514)  . amiodarone 30 mg/hr (07/11/20 2353)  . heparin 2,100 Units/hr (07/12/20 0024)   PRN Meds: sodium chloride, acetaminophen, metoprolol tartrate, ondansetron (ZOFRAN) IV, sodium chloride flush   Vital Signs    Vitals:   07/11/20 2354 07/12/20 0223 07/12/20 0424 07/12/20 0800  BP: 121/77 118/82 121/82 126/87  Pulse: (!) 127 86 (!) 127   Resp: (!) 22  (!) 22   Temp:   98.1 F (36.7 C)   TempSrc:   Oral   SpO2: 92% 96% 97% 99%  Weight:   134.5 kg   Height:        Intake/Output Summary (Last 24 hours) at 07/12/2020 1052 Last data filed at 07/12/2020  0847 Gross per 24 hour  Intake 1061.41 ml  Output 2275 ml  Net -1213.59 ml   Last 3 Weights 07/12/2020 07/11/2020 07/10/2020  Weight (lbs) 296 lb 8.3 oz 289 lb 14.4 oz 301 lb 2.4 oz  Weight (kg) 134.5 kg 131.498 kg 136.6 kg      Telemetry    Atrial fib - Personally Reviewed  ECG    A flutter with RBBB  - Personally Reviewed  Physical Exam   GEN: No acute distress.  But denies having procedures yesterday. He would like to discuss with Dr. Fuller Plan.  Neck: No JVD sitting on side of bed Cardiac: rapid HR , no murmurs, rubs, or gallops.  Respiratory: rales scattered and diminished breath sounds to auscultation bilaterally. GI: Soft, nontender, non-distended  MS: + edema above una boots but none in thigh and below [employer]; No deformity. Neuro:  Nonfocal  Psych: Normal affect   Labs    High Sensitivity Troponin:   Recent Labs  Lab 07/07/20 1505 07/07/20 1832  TROPONINIHS 14 17      Chemistry Recent Labs  Lab 07/07/20 1505 07/08/20 0143 07/10/20 0155 07/11/20 0134 07/12/20 0354  NA 137   < > 136 138 135  K 4.2   < > 3.4* 3.7 4.4  CL 99   < >  97* 96* 94*  CO2 25   < > 28 30 30   GLUCOSE 122*   < > 97 103* 169*  BUN 15   < > 17 14 17   CREATININE 0.94   < > 1.08 1.06 1.21  CALCIUM 9.2   < > 8.8* 9.1 9.2  PROT 6.7  --   --   --   --   ALBUMIN 3.2*  --   --   --   --   AST 22  --   --   --   --   ALT 18  --   --   --   --   ALKPHOS 74  --   --   --   --   BILITOT 0.9  --   --   --   --   GFRNONAA >60   < > >60 >60 >60  ANIONGAP 13   < > 11 12 11    < > = values in this interval not displayed.     Hematology Recent Labs  Lab 07/09/20 0553 07/10/20 0155 07/11/20 0134  WBC 7.5 7.2 7.8  RBC 3.33* 3.22* 3.50*  HGB 9.5* 9.3* 9.8*  HCT 31.4* 29.5* 32.3*  MCV 94.3 91.6 92.3  MCH 28.5 28.9 28.0  MCHC 30.3 31.5 30.3  RDW 15.3 15.0 14.9  PLT 207 207 235    BNP Recent Labs  Lab 07/07/20 1505  BNP 276.1*     DDimer No results for input(s): DDIMER in the last  168 hours.   Radiology    ECHOCARDIOGRAM COMPLETE  Result Date: 07/11/2020    ECHOCARDIOGRAM REPORT   Patient Name:   Shane Diaz Date of Exam: 07/11/2020 Medical Rec #:  [id number]     Height:       67.0 in Accession #:    [id number]    Weight:       289.9 lb Date of Birth:  16-Dec-1945     BSA:          2.368 m Patient Age:    89 years      BP:           114/80 mmHg Patient Gender: M             HR:           125 bpm. Exam Location:  Inpatient Procedure: 2D Echo and Intracardiac Opacification Agent Indications:    CHF- Acute Diastolic Q25.95  History:        Patient has prior history of Echocardiogram examinations, most                 recent 05/08/2020. Risk Factors:Hypertension and Dyslipidemia.  Sonographer:    Mikki Santee RDCS (AE) Referring Phys: [id number] Lequita Halt IMPRESSIONS  1. Left ventricular ejection fraction, by estimation, is 50 to 55%. The left ventricle has low normal function. The left ventricle has no regional wall motion abnormalities. There is moderate left ventricular hypertrophy. Left ventricular diastolic parameters are indeterminate.  2. Right ventricular systolic function is mildly reduced. The right ventricular size is mildly enlarged. There is mildly elevated pulmonary artery systolic pressure. The estimated right ventricular systolic pressure is 33.2 mmHg.  3. Right atrial size was mildly dilated.  4. The mitral valve is normal in structure. Trivial mitral valve regurgitation.  5. The aortic valve was not well visualized. Aortic valve regurgitation is not visualized. No aortic stenosis is present.  6. Aortic dilatation noted. There is mild dilatation of  the ascending aorta, measuring 38 mm.  7. The inferior vena cava is dilated in size with <50% respiratory variability, suggesting right atrial pressure of 15 mmHg. FINDINGS  Left Ventricle: Left ventricular ejection fraction, by estimation, is 50 to 55%. The left ventricle has low normal function. The left ventricle has no  regional wall motion abnormalities. The left ventricular internal cavity size was normal in size. There is moderate left ventricular hypertrophy. Left ventricular diastolic parameters are indeterminate. Right Ventricle: The right ventricular size is mildly enlarged. Right vetricular wall thickness was not assessed. Right ventricular systolic function is mildly reduced. There is mildly elevated pulmonary artery systolic pressure. The tricuspid regurgitant velocity is 2.39 m/s, and with an assumed right atrial pressure of 15 mmHg, the estimated right ventricular systolic pressure is 64.3 mmHg. Left Atrium: Left atrial size was normal in size. Right Atrium: Right atrial size was mildly dilated. Pericardium: Trivial pericardial effusion is present. Presence of pericardial fat pad. Mitral Valve: The mitral valve is normal in structure. Trivial mitral valve regurgitation. Tricuspid Valve: The tricuspid valve is normal in structure. Tricuspid valve regurgitation is trivial. Aortic Valve: The aortic valve was not well visualized. Aortic valve regurgitation is not visualized. No aortic stenosis is present. Pulmonic Valve: The pulmonic valve was not well visualized. Pulmonic valve regurgitation is not visualized. Aorta: The aortic root is normal in size and structure and aortic dilatation noted. There is mild dilatation of the ascending aorta, measuring 38 mm. Venous: The inferior vena cava is dilated in size with less than 50% respiratory variability, suggesting right atrial pressure of 15 mmHg. IAS/Shunts: The interatrial septum was not well visualized.  LEFT VENTRICLE PLAX 2D LVIDd:         4.00 cm LVIDs:         2.80 cm LV PW:         1.40 cm LV IVS:        1.40 cm LVOT diam:     2.40 cm LV SV:         59 LV SV Index:   25 LVOT Area:     4.52 cm  RIGHT VENTRICLE TAPSE (M-mode): 1.3 cm LEFT ATRIUM             Index       RIGHT ATRIUM           Index LA diam:        4.70 cm 1.98 cm/m  RA Area:     24.90 cm LA Vol (A2C):    97.1 ml 41.01 ml/m RA Volume:   81.10 ml  34.25 ml/m LA Vol (A4C):   49.8 ml 21.03 ml/m LA Biplane Vol: 71.3 ml 30.11 ml/m  AORTIC VALVE LVOT Vmax:   71.80 cm/s LVOT Vmean:  47.800 cm/s LVOT VTI:    0.130 m  AORTA Ao Root diam: 3.50 cm Ao Asc diam:  3.80 cm TRICUSPID VALVE TR Peak grad:   22.8 mmHg TR Vmax:        239.00 cm/s  SHUNTS Systemic VTI:  0.13 m Systemic Diam: 2.40 cm Oswaldo Milian MD Electronically signed by Oswaldo Milian MD Signature Date/Time: 07/11/2020/4:44:06 PM    Final     Cardiac Studies   Echo 07/11/20 IMPRESSIONS    1. Left ventricular ejection fraction, by estimation, is 50 to 55%. The  left ventricle has low normal function. The left ventricle has no regional  wall motion abnormalities. There is moderate left ventricular hypertrophy.  Left ventricular diastolic  parameters  are indeterminate.  2. Right ventricular systolic function is mildly reduced. The right  ventricular size is mildly enlarged. There is mildly elevated pulmonary  artery systolic pressure. The estimated right ventricular systolic  pressure is 41.9 mmHg.  3. Right atrial size was mildly dilated.  4. The mitral valve is normal in structure. Trivial mitral valve  regurgitation.  5. The aortic valve was not well visualized. Aortic valve regurgitation  is not visualized. No aortic stenosis is present.  6. Aortic dilatation noted. There is mild dilatation of the ascending  aorta, measuring 38 mm.  7. The inferior vena cava is dilated in size with <50% respiratory  variability, suggesting right atrial pressure of 15 mmHg.   FINDINGS  Left Ventricle: Left ventricular ejection fraction, by estimation, is 50  to 55%. The left ventricle has low normal function. The left ventricle has  no regional wall motion abnormalities. The left ventricular internal  cavity size was normal in size.  There is moderate left ventricular hypertrophy. Left ventricular diastolic  parameters are  indeterminate.   Right Ventricle: The right ventricular size is mildly enlarged. Right  vetricular wall thickness was not assessed. Right ventricular systolic  function is mildly reduced. There is mildly elevated pulmonary artery  systolic pressure. The tricuspid  regurgitant velocity is 2.39 m/s, and with an assumed right atrial  pressure of 15 mmHg, the estimated right ventricular systolic pressure is  37.9 mmHg.   Left Atrium: Left atrial size was normal in size.   Right Atrium: Right atrial size was mildly dilated.   Pericardium: Trivial pericardial effusion is present. Presence of  pericardial fat pad.   Mitral Valve: The mitral valve is normal in structure. Trivial mitral  valve regurgitation.   Tricuspid Valve: The tricuspid valve is normal in structure. Tricuspid  valve regurgitation is trivial.   Aortic Valve: The aortic valve was not well visualized. Aortic valve  regurgitation is not visualized. No aortic stenosis is present.   Pulmonic Valve: The pulmonic valve was not well visualized. Pulmonic valve  regurgitation is not visualized.   Aorta: The aortic root is normal in size and structure and aortic  dilatation noted. There is mild dilatation of the ascending aorta,  measuring 38 mm.   Venous: The inferior vena cava is dilated in size with less than 50%  respiratory variability, suggesting right atrial pressure of 15 mmHg.   IAS/Shunts: The interatrial septum was not well visualized.   Patient Profile     74 y.o. male admitted with dyspnea and an abrupt fall in his Hb. He was also found to have atrial fib  Assessment & Plan    1.  Shortness of breath: His shortness of breath is likely multifactorial.  He has had an abrupt fall in his hemoglobin from 17.8 in October down to 10.62 months later.    echo with mild decrease in ER from 60-65% to 50-55%.  Mod LVH  RV systolic function mildly reduced. RV size is mildly enlarged, RV systolic pressure in 02.4 mmHg    Mild aortic dilatation.   Marland Kitchen  His HR has been fast for a month or so .   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  --on lasix 40 mg IV BID  -[phone]  Since admit and wt is down 4 Kg.   2.  Atrial fibrillation with rapid ventricular response: He is currently off his DOAC.  Continue heparin.  He is currently on amiodarone drip. HR is fairly regular at 135 -  Atrial flutter  Resume Eliquis if Dr. Acie Fredrickson agrees. - rate poorly controlled  -increase lopressor to help control rate. Will defer to Dr. Acie Fredrickson.   3.  Acute GI bleed: He dropped his hemoglobin 7 g over the course of 2 months.  He is heme-positive. GI work-up prior to attempting cardioversion see above. Labs pending      For questions or updates, please contact [facility] Please consult [email or web address] for contact info under        Signed, Cecilie Kicks, NP  07/12/2020, 10:52 AM      Attending Note:   The patient was seen and examined.  Agree with assessment and plan as noted above.  Changes made to the above note as needed.  Patient seen and independently examined with  Cecilie Kicks, NP.   We discussed all aspects of the encounter. I agree with the assessment and plan as stated above.  1.  Atrial fib:  HR remains elevated.   Will increase metoprolol to 50 m bid,  Cont amiodarone Start eliquis today ,  Dc heparin  Will see if we can schedule a TEE / CV for tomorrow    I have spent a total of 40 minutes with patient reviewing hospital  notes , telemetry, EKGs, labs and examining patient as well as establishing an assessment and plan that was discussed with the patient. > 50% of time was spent in direct patient care.    Thayer Headings, Brooke Bonito., MD, [facility] 07/12/2020, 11:26 AM [address],  [place] Pager [phone]

## 2020-07-12 NOTE — Progress Notes (Signed)
PT Cancellation Note  Patient Details Name: Shane Diaz MRN: [id number] DOB: 1946/05/12   Cancelled Treatment:    Reason Eval/Treat Not Completed: Patient declined, no reason specified   Pt declined PT today stating that he is able to walk on his own and that he has to prep for colonoscopy and will be up/down to bathroom later tonight.  After left room, RN reports he had procedure yesterday and doesn't remember., but gets upset when staff tells him it was already done.   Additionally , pt's HR remains elevated.   Will f/u as able.  Abran Richard, PT Acute Rehab Services Pager [phone] [facility] [place] 07/12/2020, 4:16 PM

## 2020-07-12 NOTE — Progress Notes (Addendum)
Pt forgetful of yesterdays events. Does not remember getting the EGD and colonoscopy done and becomes agitated when staff tries to reorient him. He believes he is going down for these two procedures tomorrow 12/10.   In addition, Pt became angry and agitated after finding out he would not be able to get the cardioversion done tomorrow 12/10. MD at bedside to discuss further plans with him, added PO Dilt. If this doesn't help control his rate the Pt is aware that cardioversion may happen on 12/13. Pt is not happy about this, but states "We will see how tomorrow goes". This RN at bedside to provide clarification of plan and education to Pt.   1700: Spoke with daughter, Peter Congo, updated her on plan of care.

## 2020-07-12 NOTE — Progress Notes (Signed)
PROGRESS NOTE  Shane Diaz  DOB: [birth year]  PCP: [name], MD VQM:[id number]  DOA: 07/07/2020  LOS: 5 days   Chief Complaint  Patient presents with  . Chest Pain  . Shortness of Breath  . Leg Swelling   Brief narrative: Shane Diaz is a 74 y.o. 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 On 07/07/2020, patient presented to the ED with complaint of acute onset left-sided chest pain and shortness of breath.  Over the past few months, patient has noticed that his heart rate is often in the 130s to 150s range, otherwise asymptomatic. He was recently admitted (10/4-10/7) for generalized weakness, found to be in A. fib with RVR, responded to Cardizem drip and was discharged home on low-dose beta-blocker.  During that hospitalization, he was also found to have Covid pneumonia, received standard treatment and was discharged on 2 L oxygen. Patient lives at home with his wife, and is fairly sedentary in the house.  In the ED, patient had a temperature, tachycardic to 150s, tachypneic in 20s, required 3 L oxygen by nasal cannula. EKG showed A. fib with RVR.  Patient was started on Cardizem drip. He also had acute worsening chronic bilateral lower extremity edema. Initial lactic acid level was elevated to 2.8 subsequently down to 1.4 after IV fluid. Patient was admitted to hospitalist service.   Cardiology and GI services were consulted. 12/8, patient underwent EGD and colonoscopy.  Findings as below.  Subjective: Patient was seen and examined this morning.  Elderly Caucasian male.  He adamantly mentions that he did not get EGD or colonoscopy done yesterday which he did.  He probably does not remember them because of sedation but he claims he never got it done. No new complaints otherwise  Assessment/Plan: Sepsis secondary to bilateral lower extremity cellulitis -WBC and lactic acid level  have improved. -Switch from broad-spectrum antibiotics to oral Augmentin today.  A. fib with RVR -Presented with shortness of breath RVR to 150s.   -On amiodarone drip and heparin drip.  Still continues to have uncontrolled rate. -Patient is planned for cardioversion once GI work-up is complete.  Acute exacerbation of chronic diastolic CHF -Has significant bilateral pedal edema. -Heart failure probably precipitated by RVR. -12/8, echocardiogram showed EF of 50 to 55%, no WMA, moderate LVH, right ventricle systolic pressure elevated to 38 and right atrial pressure of 15 mmHg. -Currently on IV Lasix 40 mg twice daily.  -Charted weight loss of 10 pounds since admission but a net positive balance.  Erroneous documentation most likely -Continue to monitor for daily intake output, weight, blood pressure, BNP, renal function and electrolytes. Recent Labs  Lab 07/07/20 1505 07/08/20 0143 07/09/20 0553 07/10/20 0155 07/11/20 0134 07/12/20 0354  BNP 276.1*  --   --   --   --   --   BUN 15 15 17 17 14 17   CREATININE 0.94 0.92 1.10 1.08 1.06 1.21  K 4.2 4.0 3.2* 3.4* 3.7 4.4  MG  --   --  1.6* 1.8  --   --    Hypokalemia/hypomagnesemia -Potassium and magnesium level improved with replacement.  Recheck.  Acute anemia -Noted a drop in hemoglobin from 17 to 10.6 in a span of 2 months. On Eliquis. FOBT positive. -GI consult appreciated.  -12/8, patient underwent EGD and colonoscopy which showed normal esophagus, gastritis, normal duodenal bulb moderate diverticulosis of left colon, internal hemorrhoids.  No evidence of bleeding. -Recommendation  is to continue iron supplements.  Morbid obesity - Body mass index is 46.44 kg/m. Patient has been advised to make an attempt to improve diet and exercise patterns to aid in weight loss.  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  Significant oozing at the time of dressing change per RN. -Gradually improving with IV Lasix, leg elevation. -Wound care nurse consult appreciated.  Recommendation is to clean his legs with soap and water and pat dry, apply Xeroform, cover with ABD pad and secure with Kerlix and Ace wrap.  Dressings are changed daily.  Mobility: Limited mobility in the house.  PT eval obtained Code Status:   Code Status: DNR  Nutritional status: Body mass index is 46.44 kg/m.     Diet Order            Diet Heart Room service appropriate? Yes; Fluid consistency: Thin  Diet effective now                 DVT prophylaxis: Heparin drip Antimicrobials:  Augmentin Fluid: No need of IV fluid Consultants: Cardiology, GI Family Communication:  None at bedside  Status is: Inpatient  Remains inpatient appropriate because -pending cardioversion  Dispo: The patient is from: Home              Anticipated d/c is to: Home likely              Anticipated d/c date is: 1 to 2 days              Patient currently is not medically stable to d/c.   Infusions:  . sodium chloride 250 mL (07/08/20 1514)  . amiodarone 30 mg/hr (07/11/20 2353)  . heparin 2,100 Units/hr (07/12/20 0024)    Scheduled Meds: . amoxicillin-clavulanate  1 tablet Oral Q12H  . ferrous sulfate  325 mg Oral BID WC  . furosemide  40 mg Intravenous BID  . gatifloxacin  1 drop Left Eye QID  . magnesium oxide  400 mg Oral Daily  . metoprolol tartrate  25 mg Oral BID  . pantoprazole  40 mg Oral Daily  . potassium chloride  40 mEq Oral BID  . sodium chloride flush  3 mL Intravenous Q12H  . triamcinolone  1 application Topical BID  . vitamin B-12  1,000 mcg Oral Daily    Antimicrobials: Anti-infectives (From admission, onward)   Start     Dose/Rate Route Frequency Ordered Stop   07/10/20 1530  amoxicillin-clavulanate (AUGMENTIN) 875-125 MG per tablet 1 tablet         1 tablet Oral Every 12 hours 07/10/20 1443     07/08/20 1600  cefTRIAXone (ROCEPHIN) 2 g in sodium chloride 0.9 % 100 mL IVPB  Status:  Discontinued        2 g 200 mL/hr over 30 Minutes Intravenous Every 24 hours 07/07/20 1931 07/10/20 1443   07/08/20 0430  vancomycin (VANCOREADY) IVPB 750 mg/150 mL  Status:  Discontinued        750 mg 150 mL/hr over 60 Minutes Intravenous Every 12 hours 07/07/20 1632 07/10/20 1443   07/07/20 1545  vancomycin (VANCOCIN) IVPB 1000 mg/200 mL premix  Status:  Discontinued        1,000 mg 200 mL/hr over 60 Minutes Intravenous  Once 07/07/20 1531 07/07/20 1538   07/07/20 1545  cefTRIAXone (ROCEPHIN) 2 g in sodium chloride 0.9 % 100 mL IVPB        2 g 200 mL/hr over 30 Minutes Intravenous  Once 07/07/20 1531 07/07/20 1624   07/07/20 1545  vancomycin (VANCOREADY) IVPB 2000 mg/400 mL        2,000 mg 200 mL/hr over 120 Minutes Intravenous  Once 07/07/20 1538 07/07/20 2002      PRN meds: sodium chloride, acetaminophen, metoprolol tartrate, ondansetron (ZOFRAN) IV, sodium chloride flush   Objective: Vitals:   07/12/20 0424 07/12/20 0800  BP: 121/82 126/87  Pulse: (!) 127   Resp: (!) 22   Temp: 98.1 F (36.7 C)   SpO2: 97% 99%    Intake/Output Summary (Last 24 hours) at 07/12/2020 1030 Last data filed at 07/12/2020 0847 Gross per 24 hour  Intake 1061.41 ml  Output 2275 ml  Net -1213.59 ml   Filed Weights   07/10/20 0609 07/11/20 0508 07/12/20 0424  Weight: (!) 136.6 kg 131.5 kg 134.5 kg   Weight change: 3.002 kg Body mass index is 46.44 kg/m.   Physical Exam: General exam: Pleasant elderly Caucasian male.  Morbidly obese.  Not in physical distress Skin: No rashes, lesions or ulcers. HEENT: Atraumatic, normocephalic, no obvious bleeding Lungs: Clear to auscultation bilaterally CVS: Tachycardic, no murmur GI/Abd soft, nontender, distended from obesity, bowel sound present CNS: Alert, awake, oriented x3 Psychiatry: Mood  appropriate Extremities: Pedal edema improving, as compression bandage on both.  No calf tenderness..  Data Review: I have personally reviewed the laboratory data and studies available.  Recent Labs  Lab 07/07/20 1505 07/09/20 0553 07/10/20 0155 07/11/20 0134  WBC 13.7* 7.5 7.2 7.8  NEUTROABS 9.6* 4.9 4.7 4.8  HGB 10.6* 9.5* 9.3* 9.8*  HCT 36.5* 31.4* 29.5* 32.3*  MCV 97.9 94.3 91.6 92.3  PLT 272 207 207 235   Recent Labs  Lab 07/08/20 0143 07/09/20 0553 07/10/20 0155 07/11/20 0134 07/12/20 0354  NA 136 136 136 138 135  K 4.0 3.2* 3.4* 3.7 4.4  CL 99 97* 97* 96* 94*  CO2 25 29 28 30 30   GLUCOSE 125* 100* 97 103* 169*  BUN 15 17 17 14 17   CREATININE 0.92 1.10 1.08 1.06 1.21  CALCIUM 8.9 8.8* 8.8* 9.1 9.2  MG  --  1.6* 1.8  --   --   PHOS  --  3.2  --   --   --     F/u labs ordered.  Signed, Terrilee Croak, MD [facility] 07/12/2020

## 2020-07-13 ENCOUNTER — Inpatient Hospital Stay (HOSPITAL_COMMUNITY): Payer: Medicare Other | Admitting: Certified Registered"

## 2020-07-13 ENCOUNTER — Encounter (HOSPITAL_COMMUNITY)
Admission: EM | Disposition: A | Discharge status: Home / Self Care | Payer: Self-pay | Source: Home / Self Care | Attending: Internal Medicine

## 2020-07-13 ENCOUNTER — Inpatient Hospital Stay (HOSPITAL_COMMUNITY): Payer: Medicare Other

## 2020-07-13 ENCOUNTER — Encounter (HOSPITAL_COMMUNITY): Payer: Self-pay | Admitting: Internal Medicine

## 2020-07-13 DIAGNOSIS — I340 Nonrheumatic mitral (valve) insufficiency: Secondary | ICD-10-CM

## 2020-07-13 DIAGNOSIS — I4892 Unspecified atrial flutter: Secondary | ICD-10-CM

## 2020-07-13 DIAGNOSIS — I484 Atypical atrial flutter: Secondary | ICD-10-CM

## 2020-07-13 DIAGNOSIS — I5043 Acute on chronic combined systolic (congestive) and diastolic (congestive) heart failure: Secondary | ICD-10-CM

## 2020-07-13 HISTORY — PX: CARDIOVERSION: SHX1299

## 2020-07-13 HISTORY — PX: TEE WITHOUT CARDIOVERSION: SHX5443

## 2020-07-13 LAB — CBC WITH DIFFERENTIAL/PLATELET
Abs Immature Granulocytes: 0.06 10*3/uL (ref 0.00–0.07)
Basophils Absolute: 0.1 10*3/uL (ref 0.0–0.1)
Basophils Relative: 1 %
Eosinophils Absolute: 0.3 10*3/uL (ref 0.0–0.5)
Eosinophils Relative: 3 %
HCT: 32.7 % — ABNORMAL LOW (ref 39.0–52.0)
Hemoglobin: 9.7 g/dL — ABNORMAL LOW (ref 13.0–17.0)
Immature Granulocytes: 1 %
Lymphocytes Relative: 24 %
Lymphs Abs: 2.3 10*3/uL (ref 0.7–4.0)
MCH: 27.6 pg (ref 26.0–34.0)
MCHC: 29.7 g/dL — ABNORMAL LOW (ref 30.0–36.0)
MCV: 93.2 fL (ref 80.0–100.0)
Monocytes Absolute: 1.1 10*3/uL — ABNORMAL HIGH (ref 0.1–1.0)
Monocytes Relative: 12 %
Neutro Abs: 5.6 10*3/uL (ref 1.7–7.7)
Neutrophils Relative %: 59 %
Platelets: 231 10*3/uL (ref 150–400)
RBC: 3.51 MIL/uL — ABNORMAL LOW (ref 4.22–5.81)
RDW: 14.9 % (ref 11.5–15.5)
WBC: 9.4 10*3/uL (ref 4.0–10.5)
nRBC: 0 % (ref 0.0–0.2)

## 2020-07-13 LAB — PROTIME-INR
INR: 1.2 (ref 0.8–1.2)
Prothrombin Time: 14.5 seconds (ref 11.4–15.2)

## 2020-07-13 LAB — BASIC METABOLIC PANEL
Anion gap: 12 (ref 5–15)
BUN: 18 mg/dL (ref 8–23)
CO2: 32 mmol/L (ref 22–32)
Calcium: 9.2 mg/dL (ref 8.9–10.3)
Chloride: 93 mmol/L — ABNORMAL LOW (ref 98–111)
Creatinine, Ser: 1.32 mg/dL — ABNORMAL HIGH (ref 0.61–1.24)
GFR, Estimated: 57 mL/min — ABNORMAL LOW (ref >60)
Glucose, Bld: 96 mg/dL (ref 70–99)
Potassium: 4.1 mmol/L (ref 3.5–5.1)
Sodium: 137 mmol/L (ref 135–145)

## 2020-07-13 LAB — SURGICAL PATHOLOGY

## 2020-07-13 LAB — MAGNESIUM: Magnesium: 1.6 mg/dL — ABNORMAL LOW (ref 1.7–2.4)

## 2020-07-13 SURGERY — ECHOCARDIOGRAM, TRANSESOPHAGEAL
Anesthesia: Monitor Anesthesia Care

## 2020-07-13 MED ORDER — PROPOFOL 10 MG/ML IV BOLUS
INTRAVENOUS | Status: DC | PRN
  Administered 2020-07-13 (×2): 25 mg via INTRAVENOUS

## 2020-07-13 MED ORDER — LIDOCAINE VISCOUS HCL 2 % MT SOLN
OROMUCOSAL | Status: AC
  Filled 2020-07-13: qty 15

## 2020-07-13 MED ORDER — MAGNESIUM SULFATE 2 GM/50ML IV SOLN
2.0000 g | Freq: Once | INTRAVENOUS | Status: AC
  Administered 2020-07-13: 2 g via INTRAVENOUS
  Filled 2020-07-13: qty 50

## 2020-07-13 MED ORDER — LIDOCAINE VISCOUS HCL 2 % MT SOLN
OROMUCOSAL | Status: DC | PRN
Start: 2020-07-13 — End: 2020-07-13
  Administered 2020-07-13: 15 mL via OROMUCOSAL

## 2020-07-13 MED ORDER — SODIUM CHLORIDE 0.9 % IV SOLN: INTRAVENOUS | Status: DC

## 2020-07-13 MED ORDER — DEXMEDETOMIDINE (PRECEDEX) IN NS 20 MCG/5ML (4 MCG/ML) IV SYRINGE
PREFILLED_SYRINGE | INTRAVENOUS | Status: DC | PRN
  Administered 2020-07-13: 10 ug via INTRAVENOUS
  Administered 2020-07-13: 20 ug via INTRAVENOUS

## 2020-07-13 MED ORDER — PROPOFOL 500 MG/50ML IV EMUL
INTRAVENOUS | Status: DC | PRN
  Administered 2020-07-13: 150 ug/kg/min via INTRAVENOUS

## 2020-07-13 MED ORDER — PHENYLEPHRINE 40 MCG/ML (10ML) SYRINGE FOR IV PUSH (FOR BLOOD PRESSURE SUPPORT)
PREFILLED_SYRINGE | INTRAVENOUS | Status: DC | PRN
  Administered 2020-07-13: 120 ug via INTRAVENOUS

## 2020-07-13 MED ORDER — FENTANYL CITRATE (PF) 100 MCG/2ML IJ SOLN
INTRAMUSCULAR | Status: DC | PRN
  Administered 2020-07-13: 25 ug via INTRAVENOUS

## 2020-07-13 NOTE — Progress Notes (Signed)
Echocardiogram Echocardiogram Transesophageal has been performed.  Oneal Deputy Tyeesha Riker 07/13/2020, 12:37 PM

## 2020-07-13 NOTE — Progress Notes (Addendum)
PROGRESS NOTE  Shane Diaz  DOB: 1946/05/29  PCP: [name], MD IFO:[id number]  DOA: 07/07/2020  LOS: 6 days   Chief Complaint  Patient presents with  . Chest Pain  . Shortness of Breath  . Leg Swelling   Brief narrative: Shane Diaz is a 74 y.o. 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 On 07/07/2020, patient presented to the ED with complaint of acute onset left-sided chest pain and shortness of breath.  Over the past few months, patient has noticed that his heart rate is often in the 130s to 150s range, otherwise asymptomatic. He was recently admitted (10/4-10/7) for generalized weakness, found to be in A. fib with RVR, responded to Cardizem drip and was discharged home on low-dose beta-blocker.  During that hospitalization, he was also found to have Covid pneumonia, received standard treatment and was discharged on 2 L oxygen. Patient lives at home with his wife, and is fairly sedentary in the house.  In the ED, patient had a temperature, tachycardic to 150s, tachypneic in 20s, required 3 L oxygen by nasal cannula. EKG showed A. fib with RVR.  Patient was started on Cardizem drip. He also had acute worsening chronic bilateral lower extremity edema. Initial lactic acid level was elevated to 2.8 subsequently down to 1.4 after IV fluid. Patient was admitted to hospitalist service.   Cardiology and GI services were consulted. 12/8, patient underwent EGD and colonoscopy.  Findings as below. 12/10, patient underwent cardioversion this morning.  Subjective: Patient was seen and examined this morning.   Lying in bed. Not in distress. Not on supplemental oxygen this morning. He was waiting for cardioversion.  Underwent successful cardioversion later this morning.   Assessment/Plan: Sepsis secondary to bilateral lower extremity cellulitis -WBC and lactic acid level have  improved. -Patient completed 7-day course of antibiotics today.  A. fib with RVR -Presented with shortness of breath RVR to 150s.   -On amiodarone drip and heparin drip. Underwent cardioversion this morning  Acute exacerbation of chronic diastolic CHF -Has significant bilateral pedal edema. -Heart failure probably precipitated by RVR. -12/8, echocardiogram showed EF of 50 to 55%, no WMA, moderate LVH, right ventricle systolic pressure elevated to 38 and right atrial pressure of 15 mmHg. -Currently on IV Lasix 40 mg twice daily. Continue the same. -Patient is making significant urine output. Documentation does not seem correct. More than 10 pound weight loss since admission. -Continue to monitor for daily intake output, weight, blood pressure, BNP, renal function and electrolytes. Recent Labs  Lab 07/07/20 1505 07/08/20 0143 07/09/20 0553 07/10/20 0155 07/11/20 0134 07/12/20 0354 07/13/20 0316  BNP 276.1*  --   --   --   --   --   --   BUN 15   < > 17 17 14 17 18   CREATININE 0.94   < > 1.10 1.08 1.06 1.21 1.32*  K 4.2   < > 3.2* 3.4* 3.7 4.4 4.1  MG  --   --  1.6* 1.8  --   --  1.6*   < > = values in this interval not displayed.   Hypokalemia/hypomagnesemia -Magnesium level low at 1.6 this morning. Replaced. Potassium normal at 4.1 -Recheck tomorrow.  AKI -Creatinine 1.32 today, baseline less than one. -This is likely because of diuresis. Continue to monitor as patient is still volume overloaded. Continue to monitor creatinine.  Acute anemia -Noted a drop in hemoglobin from 17 to 10.6  in a span of 2 months. On Eliquis. FOBT positive. -GI consult appreciated.  -12/8, patient underwent EGD and colonoscopy which showed normal esophagus, gastritis, normal duodenal bulb moderate diverticulosis of left colon, internal hemorrhoids.  No evidence of bleeding. -Recommendation is to continue iron supplements.  Morbid obesity - Body mass index is 44.32 kg/m. Patient has been advised  to make an attempt to improve diet and exercise patterns to aid in weight loss.  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  Significant oozing at the time of dressing change per RN. -Gradually improving with IV Lasix, leg elevation. -Wound care nurse consult appreciated.  Recommendation is to clean his legs with soap and water and pat dry, apply Xeroform, cover with ABD pad and secure with Kerlix and Ace wrap.  Dressings are changed daily.  Mobility: Limited mobility in the house.  PT eval obtained Code Status:   Code Status: DNR  Nutritional status: Body mass index is 44.32 kg/m.     Diet Order    None      DVT prophylaxis: Heparin drip Antimicrobials:  Completed the course Fluid: No need of IV fluid Consultants: Cardiology, GI Family Communication:  None at bedside  Status is: Inpatient  Remains inpatient appropriate because -underwent cardioversion today. Remains on amiodarone drip.  Dispo: The patient is from: Home              Anticipated d/c is to: Home               Anticipated d/c date is: Likely tomorrow              Patient currently is not medically stable to d/c.   Infusions:  . sodium chloride 250 mL (07/08/20 1514)  . amiodarone 30 mg/hr (07/13/20 0331)    Scheduled Meds: . amoxicillin-clavulanate  1 tablet Oral Q12H  . apixaban  5 mg Oral BID  . diltiazem  30 mg Oral Q6H  . ferrous sulfate  325 mg Oral BID WC  . furosemide  40 mg Intravenous BID  . gatifloxacin  1 drop Left Eye QID  . magnesium oxide  400 mg Oral Daily  . metoprolol tartrate  50 mg Oral BID  . pantoprazole  40 mg Oral Daily  . potassium chloride  40 mEq Oral BID  . sodium chloride flush  3 mL Intravenous Q12H  . triamcinolone  1 application Topical BID  . vitamin B-12  1,000 mcg Oral  Daily    Antimicrobials: Anti-infectives (From admission, onward)   Start     Dose/Rate Route Frequency Ordered Stop   07/10/20 1530  amoxicillin-clavulanate (AUGMENTIN) 875-125 MG per tablet 1 tablet        1 tablet Oral Every 12 hours 07/10/20 1443 07/13/20 2359   07/08/20 1600  cefTRIAXone (ROCEPHIN) 2 g in sodium chloride 0.9 % 100 mL IVPB  Status:  Discontinued        2 g 200 mL/hr over 30 Minutes Intravenous Every 24 hours 07/07/20 1931 07/10/20 1443   07/08/20 0430  vancomycin (VANCOREADY) IVPB 750 mg/150 mL  Status:  Discontinued        750 mg 150 mL/hr over 60 Minutes Intravenous Every 12 hours 07/07/20 1632 07/10/20 1443   07/07/20 1545  vancomycin (VANCOCIN) IVPB 1000 mg/200 mL premix  Status:  Discontinued  1,000 mg 200 mL/hr over 60 Minutes Intravenous  Once 07/07/20 1531 07/07/20 1538   07/07/20 1545  cefTRIAXone (ROCEPHIN) 2 g in sodium chloride 0.9 % 100 mL IVPB        2 g 200 mL/hr over 30 Minutes Intravenous  Once 07/07/20 1531 07/07/20 1624   07/07/20 1545  vancomycin (VANCOREADY) IVPB 2000 mg/400 mL        2,000 mg 200 mL/hr over 120 Minutes Intravenous  Once 07/07/20 1538 07/07/20 2002      PRN meds: sodium chloride, acetaminophen, metoprolol tartrate, ondansetron (ZOFRAN) IV, sodium chloride flush   Objective: Vitals:   07/13/20 1300 07/13/20 1329  BP: 121/81 116/79  Pulse: 83 85  Resp: (!) 27 (!) 23  Temp:    SpO2: 99%     Intake/Output Summary (Last 24 hours) at 07/13/2020 1353 Last data filed at 07/13/2020 1236 Gross per 24 hour  Intake 540 ml  Output 550 ml  Net -10 ml   Filed Weights   07/11/20 0508 07/12/20 0424 07/13/20 0334  Weight: 131.5 kg 134.5 kg 128.4 kg   Weight change: -6.132 kg Body mass index is 44.32 kg/m.   Physical Exam: General exam: Pleasant elderly Caucasian male. Morbidly obese. Not in physical distress Skin: No rashes, lesions or ulcers. HEENT: Atraumatic, normocephalic, no obvious bleeding Lungs: Clear to  auscultation bilaterally CVS: tachycardic in A. fib this morning, no murmur GI/Abd soft, nontender, distended from obesity, bowel sound present CNS: Alert, awake, oriented x3 Psychiatry: Mood appropriate Extremities: Pedal edema significantly improving, has compression bandage on both.  No calf tenderness..  Data Review: I have personally reviewed the laboratory data and studies available.  Recent Labs  Lab 07/07/20 1505 07/09/20 0553 07/10/20 0155 07/11/20 0134 07/13/20 0316  WBC 13.7* 7.5 7.2 7.8 9.4  NEUTROABS 9.6* 4.9 4.7 4.8 5.6  HGB 10.6* 9.5* 9.3* 9.8* 9.7*  HCT 36.5* 31.4* 29.5* 32.3* 32.7*  MCV 97.9 94.3 91.6 92.3 93.2  PLT 272 207 207 235 231   Recent Labs  Lab 07/09/20 0553 07/10/20 0155 07/11/20 0134 07/12/20 0354 07/13/20 0316  NA 136 136 138 135 137  K 3.2* 3.4* 3.7 4.4 4.1  CL 97* 97* 96* 94* 93*  CO2 29 28 30 30  32  GLUCOSE 100* 97 103* 169* 96  BUN 17 17 14 17 18   CREATININE 1.10 1.08 1.06 1.21 1.32*  CALCIUM 8.8* 8.8* 9.1 9.2 9.2  MG 1.6* 1.8  --   --  1.6*  PHOS 3.2  --   --   --   --     F/u labs ordered.  Signed, Terrilee Croak, MD [facility] 07/13/2020

## 2020-07-13 NOTE — Progress Notes (Signed)
   07/13/20 0908  Assess: MEWS Score  Temp 98.3 F (36.8 C)  BP 115/70  Pulse Rate (!) 139  Resp 18  Level of Consciousness Alert  SpO2 92 %  O2 Device Room Air  Assess: MEWS Score  MEWS Temp 0  MEWS Systolic 0  MEWS Pulse 3  MEWS RR 0  MEWS LOC 0  MEWS Score 3  MEWS Score Color Yellow  Assess: if the MEWS score is Yellow or Red  Were vital signs taken at a resting state? Yes  Focused Assessment No change from prior assessment  Early Detection of Sepsis Score *See Row Information* Low  MEWS guidelines implemented *See Row Information* No, vital signs rechecked  Pt assessed. No change from previous status.

## 2020-07-13 NOTE — Anesthesia Procedure Notes (Signed)
Procedure Name: MAC Date/Time: 07/13/2020 12:20 PM Performed by: Imagene Riches, CRNA Pre-anesthesia Checklist: Patient identified, Emergency Drugs available, Suction available, Patient being monitored and Timeout performed Patient Re-evaluated:Patient Re-evaluated prior to induction Oxygen Delivery Method: Nasal cannula

## 2020-07-13 NOTE — Anesthesia Preprocedure Evaluation (Addendum)
Anesthesia Evaluation  Patient identified by MRN, date of birth, ID band Patient awake    Reviewed: Allergy & Precautions, NPO status , Patient's Chart, lab work & pertinent test results  Airway Mallampati: II  TM Distance: >3 FB Neck ROM: Full    Dental no notable dental hx.    Pulmonary  Home oxygen   Pulmonary exam normal breath sounds clear to auscultation       Cardiovascular hypertension, Pt. on home beta blockers Normal cardiovascular exam Rhythm:Regular Rate:Normal  ECG: Rate 137   Neuro/Psych negative neurological ROS  negative psych ROS   GI/Hepatic Neg liver ROS, GERD  Medicated and Controlled,  Endo/Other  Morbid obesity  Renal/GU Renal disease     Musculoskeletal  (+) Arthritis ,   Abdominal (+) + obese,   Peds  Hematology  (+) anemia , HLD   Anesthesia Other Findings A-fib  Reproductive/Obstetrics                            Anesthesia Physical Anesthesia Plan  ASA: IV  Anesthesia Plan: MAC   Post-op Pain Management:    Induction: Intravenous  PONV Risk Score and Plan: 1 and Propofol infusion and Treatment may vary due to age or medical condition  Airway Management Planned: Nasal Cannula  Additional Equipment:   Intra-op Plan:   Post-operative Plan:   Informed Consent: I have reviewed the patients History and Physical, chart, labs and discussed the procedure including the risks, benefits and alternatives for the proposed anesthesia with the patient or authorized representative who has indicated his/her understanding and acceptance.   Patient has DNR.  Discussed DNR with patient and Suspend DNR.   Dental advisory given  Plan Discussed with: CRNA  Anesthesia Plan Comments:         Anesthesia Quick Evaluation

## 2020-07-13 NOTE — Progress Notes (Signed)
Progress Note  Patient Name: Shane Diaz Date of Encounter: 07/13/2020  [facility] Cardiologist: Donato Heinz, MD   Subjective   74 year old gentleman with a history of obstructive sleep apnea, prediabetes morbid obesity.  He is admitted with worsening shortness of breath and was found to have atrial  flutter.  He has been found to have an acute drop in his hemoglobin from 17 down to 10 g. He is heme-positive. He has been seen by gastroenterology and is scheduled for upper and lower endoscopy. Our plan is to make sure that he is evaluated for possible GI bleeding before we commit him to cardioversion and 3 to 4 weeks of anticoagulation.   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   Despite being on amio, metoprolol, dilt    Inpatient Medications    Scheduled Meds: . amoxicillin-clavulanate  1 tablet Oral Q12H  . apixaban  5 mg Oral BID  . diltiazem  30 mg Oral Q6H  . ferrous sulfate  325 mg Oral BID WC  . furosemide  40 mg Intravenous BID  . gatifloxacin  1 drop Left Eye QID  . magnesium oxide  400 mg Oral Daily  . metoprolol tartrate  50 mg Oral BID  . pantoprazole  40 mg Oral Daily  . potassium chloride  40 mEq Oral BID  . sodium chloride flush  3 mL Intravenous Q12H  . triamcinolone  1 application Topical BID  . vitamin B-12  1,000 mcg Oral Daily   Continuous Infusions: . sodium chloride 250 mL (07/08/20 1514)  . sodium chloride    . amiodarone 30 mg/hr (07/13/20 0331)  . magnesium sulfate bolus IVPB     PRN Meds: sodium chloride, acetaminophen, metoprolol tartrate, ondansetron (ZOFRAN) IV, sodium chloride flush   Vital Signs    Vitals:   07/12/20 1948 07/12/20 2320 07/13/20 0039 07/13/20 0334  BP: (!) 124/93 116/90 111/79 107/81  Pulse: (!) 136 (!) 133 (!) 133 (!) 136   Resp: 19 18 16 18   Temp: 98.1 F (36.7 C) 97.7 F (36.5 C) 98.2 F (36.8 C) 98.4 F (36.9 C)  TempSrc: Oral Oral Oral Oral  SpO2: 98% 93% 98% 92%  Weight:    128.4 kg  Height:        Intake/Output Summary (Last 24 hours) at 07/13/2020 0842 Last data filed at 07/13/2020 0331 Gross per 24 hour  Intake 240 ml  Output 700 ml  Net -460 ml   Last 3 Weights 07/13/2020 07/12/2020 07/11/2020  Weight (lbs) 283 lb 296 lb 8.3 oz 289 lb 14.4 oz  Weight (kg) 128.368 kg 134.5 kg 131.498 kg      Telemetry    Atrial flutter  - Personally Reviewed  ECG    A flutter with RBBB  - Personally Reviewed  Physical Exam   Physical Exam: Blood pressure 107/81, pulse (!) 136, temperature 98.4 F (36.9 C), temperature source Oral, resp. rate 18, height 5\' 7"  (1.702 m), weight 128.4 kg, SpO2 92 %.  GEN:   Obese, elderly male,  Much calmer today , not as irrate HEENT: Normal NECK: No JVD; No carotid bruits LYMPHATICS: No lymphadenopathy CARDIAC: RRR , tachy  RESPIRATORY:  Clear to auscultation without rales, wheezing or rhonchi  ABDOMEN: Soft, non-tender, non-distended MUSCULOSKELETAL:  2+ pitting edema SKIN: Warm and dry NEUROLOGIC:  Alert  and oriented x 3  Labs    High Sensitivity Troponin:   Recent Labs  Lab 07/07/20 1505 07/07/20 1832  TROPONINIHS 14 17      Chemistry Recent Labs  Lab 07/07/20 1505 07/08/20 0143 07/11/20 0134 07/12/20 0354 07/13/20 0316  NA 137   < > 138 135 137  K 4.2   < > 3.7 4.4 4.1  CL 99   < > 96* 94* 93*  CO2 25   < > 30 30 32  GLUCOSE 122*   < > 103* 169* 96  BUN 15   < > 14 17 18   CREATININE 0.94   < > 1.06 1.21 1.32*  CALCIUM 9.2   < > 9.1 9.2 9.2  PROT 6.7  --   --   --   --   ALBUMIN 3.2*  --   --   --   --   AST 22  --   --   --   --   ALT 18  --   --   --   --   ALKPHOS 74  --   --   --   --   BILITOT 0.9  --   --   --   --   GFRNONAA >60   < > >60 >60 57*  ANIONGAP 13   < > 12 11 12    < > = values in this interval not displayed.      Hematology Recent Labs  Lab 07/10/20 0155 07/11/20 0134 07/13/20 0316  WBC 7.2 7.8 9.4  RBC 3.22* 3.50* 3.51*  HGB 9.3* 9.8* 9.7*  HCT 29.5* 32.3* 32.7*  MCV 91.6 92.3 93.2  MCH 28.9 28.0 27.6  MCHC 31.5 30.3 29.7*  RDW 15.0 14.9 14.9  PLT 207 235 231    BNP Recent Labs  Lab 07/07/20 1505  BNP 276.1*     DDimer No results for input(s): DDIMER in the last 168 hours.   Radiology    ECHOCARDIOGRAM COMPLETE  Result Date: 07/11/2020    ECHOCARDIOGRAM REPORT   Patient Name:   Shane Diaz Date of Exam: 07/11/2020 Medical Rec #:  [id number]     Height:       67.0 in Accession #:    [id number]    Weight:       289.9 lb Date of Birth:  March 25, 1946     BSA:          2.368 m Patient Age:    41 years      BP:           114/80 mmHg Patient Gender: M             HR:           125 bpm. Exam Location:  Inpatient Procedure: 2D Echo and Intracardiac Opacification Agent Indications:    CHF- Acute Diastolic L39.03  History:        Patient has prior history of Echocardiogram examinations, most                 recent 05/08/2020. Risk Factors:Hypertension and Dyslipidemia.  Sonographer:    Mikki Santee RDCS (AE) Referring Phys: [id number] Lequita Halt IMPRESSIONS  1. Left ventricular ejection fraction, by estimation, is 50 to 55%. The left ventricle has low normal function. The left ventricle has no regional wall motion abnormalities. There is moderate left ventricular hypertrophy. Left ventricular diastolic parameters are indeterminate.  2. Right ventricular systolic function is mildly reduced. The right  ventricular size is mildly enlarged. There is mildly elevated pulmonary artery systolic pressure. The estimated right ventricular systolic pressure is 41.9 mmHg.  3. Right atrial size was mildly dilated.  4. The mitral valve is normal in structure. Trivial mitral valve regurgitation.  5. The aortic valve was not well visualized. Aortic valve regurgitation is not visualized. No aortic stenosis is  present.  6. Aortic dilatation noted. There is mild dilatation of the ascending aorta, measuring 38 mm.  7. The inferior vena cava is dilated in size with <50% respiratory variability, suggesting right atrial pressure of 15 mmHg. FINDINGS  Left Ventricle: Left ventricular ejection fraction, by estimation, is 50 to 55%. The left ventricle has low normal function. The left ventricle has no regional wall motion abnormalities. The left ventricular internal cavity size was normal in size. There is moderate left ventricular hypertrophy. Left ventricular diastolic parameters are indeterminate. Right Ventricle: The right ventricular size is mildly enlarged. Right vetricular wall thickness was not assessed. Right ventricular systolic function is mildly reduced. There is mildly elevated pulmonary artery systolic pressure. The tricuspid regurgitant velocity is 2.39 m/s, and with an assumed right atrial pressure of 15 mmHg, the estimated right ventricular systolic pressure is 62.2 mmHg. Left Atrium: Left atrial size was normal in size. Right Atrium: Right atrial size was mildly dilated. Pericardium: Trivial pericardial effusion is present. Presence of pericardial fat pad. Mitral Valve: The mitral valve is normal in structure. Trivial mitral valve regurgitation. Tricuspid Valve: The tricuspid valve is normal in structure. Tricuspid valve regurgitation is trivial. Aortic Valve: The aortic valve was not well visualized. Aortic valve regurgitation is not visualized. No aortic stenosis is present. Pulmonic Valve: The pulmonic valve was not well visualized. Pulmonic valve regurgitation is not visualized. Aorta: The aortic root is normal in size and structure and aortic dilatation noted. There is mild dilatation of the ascending aorta, measuring 38 mm. Venous: The inferior vena cava is dilated in size with less than 50% respiratory variability, suggesting right atrial pressure of 15 mmHg. IAS/Shunts: The interatrial septum was not  well visualized.  LEFT VENTRICLE PLAX 2D LVIDd:         4.00 cm LVIDs:         2.80 cm LV PW:         1.40 cm LV IVS:        1.40 cm LVOT diam:     2.40 cm LV SV:         59 LV SV Index:   25 LVOT Area:     4.52 cm  RIGHT VENTRICLE TAPSE (M-mode): 1.3 cm LEFT ATRIUM             Index       RIGHT ATRIUM           Index LA diam:        4.70 cm 1.98 cm/m  RA Area:     24.90 cm LA Vol (A2C):   97.1 ml 41.01 ml/m RA Volume:   81.10 ml  34.25 ml/m LA Vol (A4C):   49.8 ml 21.03 ml/m LA Biplane Vol: 71.3 ml 30.11 ml/m  AORTIC VALVE LVOT Vmax:   71.80 cm/s LVOT Vmean:  47.800 cm/s LVOT VTI:    0.130 m  AORTA Ao Root diam: 3.50 cm Ao Asc diam:  3.80 cm TRICUSPID VALVE TR Peak grad:   22.8 mmHg TR Vmax:        239.00 cm/s  SHUNTS Systemic VTI:  0.13 m Systemic Diam: 2.40  cm Oswaldo Milian MD Electronically signed by Oswaldo Milian MD Signature Date/Time: 07/11/2020/4:44:06 PM    Final     Cardiac Studies   Echo 07/11/20 IMPRESSIONS    1. Left ventricular ejection fraction, by estimation, is 50 to 55%. The  left ventricle has low normal function. The left ventricle has no regional  wall motion abnormalities. There is moderate left ventricular hypertrophy.  Left ventricular diastolic  parameters are indeterminate.  2. Right ventricular systolic function is mildly reduced. The right  ventricular size is mildly enlarged. There is mildly elevated pulmonary  artery systolic pressure. The estimated right ventricular systolic  pressure is 61.9 mmHg.  3. Right atrial size was mildly dilated.  4. The mitral valve is normal in structure. Trivial mitral valve  regurgitation.  5. The aortic valve was not well visualized. Aortic valve regurgitation  is not visualized. No aortic stenosis is present.  6. Aortic dilatation noted. There is mild dilatation of the ascending  aorta, measuring 38 mm.  7. The inferior vena cava is dilated in size with <50% respiratory  variability, suggesting right  atrial pressure of 15 mmHg.   FINDINGS  Left Ventricle: Left ventricular ejection fraction, by estimation, is 50  to 55%. The left ventricle has low normal function. The left ventricle has  no regional wall motion abnormalities. The left ventricular internal  cavity size was normal in size.  There is moderate left ventricular hypertrophy. Left ventricular diastolic  parameters are indeterminate.   Right Ventricle: The right ventricular size is mildly enlarged. Right  vetricular wall thickness was not assessed. Right ventricular systolic  function is mildly reduced. There is mildly elevated pulmonary artery  systolic pressure. The tricuspid  regurgitant velocity is 2.39 m/s, and with an assumed right atrial  pressure of 15 mmHg, the estimated right ventricular systolic pressure is  50.9 mmHg.   Left Atrium: Left atrial size was normal in size.   Right Atrium: Right atrial size was mildly dilated.   Pericardium: Trivial pericardial effusion is present. Presence of  pericardial fat pad.   Mitral Valve: The mitral valve is normal in structure. Trivial mitral  valve regurgitation.   Tricuspid Valve: The tricuspid valve is normal in structure. Tricuspid  valve regurgitation is trivial.   Aortic Valve: The aortic valve was not well visualized. Aortic valve  regurgitation is not visualized. No aortic stenosis is present.   Pulmonic Valve: The pulmonic valve was not well visualized. Pulmonic valve  regurgitation is not visualized.   Aorta: The aortic root is normal in size and structure and aortic  dilatation noted. There is mild dilatation of the ascending aorta,  measuring 38 mm.   Venous: The inferior vena cava is dilated in size with less than 50%  respiratory variability, suggesting right atrial pressure of 15 mmHg.   IAS/Shunts: The interatrial septum was not well visualized.   Patient Profile     74 y.o. male admitted with dyspnea and an abrupt fall in his Hb. He was  also found to have atrial fib  Assessment & Plan    1.  Shortness of breath: His shortness of breath is likely multifactorial.  He has had an abrupt fall in his hemoglobin from 17.8 in October down to 10.62 months later.    . EF 50-55%. Likely has diastolic dysfunction ,  Could not evaluate by echo   2.  Atrial fibrillation with rapid ventricular response: He is currently off his DOAC. HR remains elevated despite amio, metoprolol, dilt Scheduled  for TEE / CV today    3.  Acute GI bleed: He dropped his hemoglobin 7 g over the course of 2 months.  He is heme-positive.   GI did not find any source of bleeding with colonoscopy or egd       Mertie Moores, MD  07/13/2020 8:46 AM    [address],  [place], [place]  [address] Phone: [phone]; Fax: [phone]

## 2020-07-13 NOTE — Transfer of Care (Signed)
Immediate Anesthesia Transfer of Care Note  Patient: Shane Diaz  Procedure(s) Performed: TRANSESOPHAGEAL ECHOCARDIOGRAM (TEE) (N/A ) CARDIOVERSION (N/A )  Patient Location: Endoscopy Unit  Anesthesia Type:General  Level of Consciousness: drowsy  Airway & Oxygen Therapy: Patient Spontanous Breathing and Patient connected to face mask oxygen  Post-op Assessment: Report given to RN and Post -op Vital signs reviewed and stable  Post vital signs: Reviewed and stable  Last Vitals:  Vitals Value Taken Time  BP 110/79 07/13/20 1241  Temp 36.7 C 07/13/20 1241  Pulse 85 07/13/20 1243  Resp 25 07/13/20 1243  SpO2 100 % 07/13/20 1243  Vitals shown include unvalidated device data.  Last Pain:  Vitals:   07/13/20 1241  TempSrc: Temporal  PainSc: 0-No pain         Complications: No complications documented.

## 2020-07-13 NOTE — CV Procedure (Signed)
TEE/DCC: Anesthesia: Propofol  EF 45-50% in rapid atrial flutter rate 133 Mild MR Mild AR Moderate TR Severe RAE Moderate LAE No LAA/RAA thrombus Severe RVE/Hypokinesis   DCC x 1 200J converted from atrial flutter rate 133 to NSR Rate 88 bpm Has had 3 doses of eliquis No immediate neurologic sequelae  Jenkins Rouge MD [facility]

## 2020-07-13 NOTE — Progress Notes (Signed)
Physical Therapy Treatment Patient Details Name: Shane Diaz MRN: [id number] DOB: 09-14-1945 Today's Date: 07/13/2020    History of Present Illness Shane Diaz is a 74 y.o. male with medical history significant of paroxysmal A. Fib, bilateral DVT and Eliquis, morbid obesity, OSA, prediabetes, presented with worsening of palpitations and worsening bilat LE swelling, itchiness with clear liquid discharge. Found to have A-flutter. Plan for TEE 07/13/20. COVID PNA 05/2020.    PT Comments    Patient progressing well towards PT goals. Awake and willing to participate today. Improved ambulation distance with Min guard assist for balance/safety. Waddling like gait pattern with reports of chest pain (reports worsened from start of session but does not appear in any distress). Sp02 dropped to 84% on RA during activity and HR ranged from 130s-152 bpm. Donned 02 at end of session. Pt going down for TEE so session cut short. Will follow.    Follow Up Recommendations  Home health PT;Supervision/Assistance - 24 hour (pending progress)     Equipment Recommendations  None recommended by PT    Recommendations for Other Services       Precautions / Restrictions Precautions Precautions: Fall;Other (comment) Precaution Comments: tachycardia, watch HR Restrictions Weight Bearing Restrictions: No    Mobility  Bed Mobility Overal bed mobility: Needs Assistance Bed Mobility: Supine to Sit;Sit to Supine     Supine to sit: Modified independent (Device/Increase time) Sit to supine: Modified independent (Device/Increase time)   General bed mobility comments: Pt has a make shift rope tied to end of bed he uses to pull self up into long sitting without assist. No assist to bring LEs into bed to return to supine.  Transfers Overall transfer level: Needs assistance Equipment used: None Transfers: Sit to/from Stand Sit to Stand: Min guard         General transfer comment: Min guard for safety.  Use of rail to assist with powering up, wide BoS. HR in 130s bpm.  Ambulation/Gait Ambulation/Gait assistance: Min guard Gait Distance (Feet): 120 Feet Assistive device: None Gait Pattern/deviations: Step-through pattern;Decreased stride length;Wide base of support;Decreased step length - right;Decreased step length - left Gait velocity: decreased Gait velocity interpretation: <1.31 ft/sec, indicative of household ambulator General Gait Details: Waddling like gait with wide BoS; decreased trunk/UE movement. HR ranged from 130s-152 bpm, reports same chest pain as when started. Does not seem in distress. Sp02 dropped to 84% on RA, donned 2L/min 02 [place].   Stairs             Wheelchair Mobility    Modified Rankin (Stroke Patients Only)       Balance Overall balance assessment: Mild deficits observed, not formally tested                                          Cognition Arousal/Alertness: Awake/alert Behavior During Therapy: WFL for tasks assessed/performed Overall Cognitive Status: Within Functional Limits for tasks assessed                                 General Comments: Appears WFL for basic mobility tasks but needs repetition at times, not sure if this is due to [facility] or language?      Exercises      General Comments General comments (skin integrity, edema, etc.): Swelling seems improved in LEs/feet, LEs wrapped in [place]  boots. Continues to have redness in feet, no pain.      Pertinent Vitals/Pain Pain Assessment: Faces Faces Pain Scale: Hurts little more Pain Location: chest pain Pain Descriptors / Indicators: Discomfort;Constant Pain Intervention(s): Monitored during session;Repositioned    Home Living                      Prior Function            PT Goals (current goals can now be found in the care plan section) Progress towards PT goals: Progressing toward goals    Frequency    Min 3X/week      PT Plan  Current plan remains appropriate    Co-evaluation              AM-PAC PT "6 Clicks" Mobility   Outcome Measure  Help needed turning from your back to your side while in a flat bed without using bedrails?: None Help needed moving from lying on your back to sitting on the side of a flat bed without using bedrails?: A Little Help needed moving to and from a bed to a chair (including a wheelchair)?: A Little Help needed standing up from a chair using your arms (e.g., wheelchair or bedside chair)?: A Little Help needed to walk in hospital room?: A Little Help needed climbing 3-5 steps with a railing? : A Lot 6 Click Score: 18    End of Session Equipment Utilized During Treatment: Gait belt Activity Tolerance: Treatment limited secondary to medical complications (Comment) (drop in SP02 and tachycardia) Patient left: in bed;with call bell/phone within reach Nurse Communication: Mobility status PT Visit Diagnosis: Unsteadiness on feet (R26.81);Muscle weakness (generalized) (M62.81)     Time: 1110-1130 PT Time Calculation (min) (ACUTE ONLY): 20 min  Charges:  $Gait Training: 8-22 mins                     Marisa Severin, PT, DPT Acute Rehabilitation Services Pager [phone] Office [place] 07/13/2020, 12:13 PM

## 2020-07-14 ENCOUNTER — Encounter: Payer: Self-pay | Admitting: Gastroenterology

## 2020-07-14 ENCOUNTER — Inpatient Hospital Stay (HOSPITAL_COMMUNITY): Payer: Medicare Other

## 2020-07-14 DIAGNOSIS — I5041 Acute combined systolic (congestive) and diastolic (congestive) heart failure: Secondary | ICD-10-CM

## 2020-07-14 DIAGNOSIS — D509 Iron deficiency anemia, unspecified: Secondary | ICD-10-CM

## 2020-07-14 LAB — CBC WITH DIFFERENTIAL/PLATELET
Abs Immature Granulocytes: 0.07 10*3/uL (ref 0.00–0.07)
Basophils Absolute: 0 10*3/uL (ref 0.0–0.1)
Basophils Relative: 0 %
Eosinophils Absolute: 0.2 10*3/uL (ref 0.0–0.5)
Eosinophils Relative: 3 %
HCT: 31.3 % — ABNORMAL LOW (ref 39.0–52.0)
Hemoglobin: 9.4 g/dL — ABNORMAL LOW (ref 13.0–17.0)
Immature Granulocytes: 1 %
Lymphocytes Relative: 24 %
Lymphs Abs: 1.9 10*3/uL (ref 0.7–4.0)
MCH: 27.9 pg (ref 26.0–34.0)
MCHC: 30 g/dL (ref 30.0–36.0)
MCV: 92.9 fL (ref 80.0–100.0)
Monocytes Absolute: 1.1 10*3/uL — ABNORMAL HIGH (ref 0.1–1.0)
Monocytes Relative: 15 %
Neutro Abs: 4.3 10*3/uL (ref 1.7–7.7)
Neutrophils Relative %: 57 %
Platelets: 204 10*3/uL (ref 150–400)
RBC: 3.37 MIL/uL — ABNORMAL LOW (ref 4.22–5.81)
RDW: 15.3 % (ref 11.5–15.5)
WBC: 7.6 10*3/uL (ref 4.0–10.5)
nRBC: 0 % (ref 0.0–0.2)

## 2020-07-14 LAB — BASIC METABOLIC PANEL
Anion gap: 9 (ref 5–15)
BUN: 16 mg/dL (ref 8–23)
CO2: 33 mmol/L — ABNORMAL HIGH (ref 22–32)
Calcium: 9.2 mg/dL (ref 8.9–10.3)
Chloride: 94 mmol/L — ABNORMAL LOW (ref 98–111)
Creatinine, Ser: 1.19 mg/dL (ref 0.61–1.24)
GFR, Estimated: >60 mL/min (ref >60)
Glucose, Bld: 103 mg/dL — ABNORMAL HIGH (ref 70–99)
Potassium: 4.3 mmol/L (ref 3.5–5.1)
Sodium: 136 mmol/L (ref 135–145)

## 2020-07-14 LAB — MAGNESIUM: Magnesium: 1.8 mg/dL (ref 1.7–2.4)

## 2020-07-14 MED ORDER — FERROUS SULFATE 325 (65 FE) MG PO TABS: 325.0000 mg | ORAL_TABLET | Freq: Two times a day (BID) | ORAL | 0 refills | Status: DC

## 2020-07-14 MED ORDER — IOHEXOL 350 MG/ML SOLN
80.0000 mL | Freq: Once | INTRAVENOUS | Status: AC | PRN
  Administered 2020-07-14: 09:00:00 80 mL via INTRAVENOUS

## 2020-07-14 MED ORDER — AMOXICILLIN-POT CLAVULANATE 875-125 MG PO TABS: 1.0000 | ORAL_TABLET | Freq: Two times a day (BID) | ORAL | 0 refills | Status: AC

## 2020-07-14 MED ORDER — FUROSEMIDE 40 MG PO TABS: 40.0000 mg | ORAL_TABLET | Freq: Every day | ORAL | 11 refills | Status: DC

## 2020-07-14 MED ORDER — METOPROLOL TARTRATE 50 MG PO TABS
50.0000 mg | ORAL_TABLET | Freq: Two times a day (BID) | ORAL | 0 refills | Status: DC
Start: 2020-07-14 — End: 2020-08-03

## 2020-07-14 MED ORDER — ATORVASTATIN CALCIUM 40 MG PO TABS: 40.0000 mg | ORAL_TABLET | Freq: Every day | ORAL | 11 refills | Status: DC

## 2020-07-14 MED ORDER — CYANOCOBALAMIN 1000 MCG PO TABS: 1000.0000 ug | ORAL_TABLET | Freq: Every day | ORAL | 0 refills | Status: AC

## 2020-07-14 NOTE — Progress Notes (Signed)
Progress Note  Patient Name: Shane Diaz Date of Encounter: 07/14/2020  [facility] Cardiologist: Donato Heinz, MD   Subjective   Feels better post [facility]  Inpatient Medications    Scheduled Meds: . apixaban  5 mg Oral BID  . ferrous sulfate  325 mg Oral BID WC  . furosemide  40 mg Intravenous BID  . gatifloxacin  1 drop Left Eye QID  . magnesium oxide  400 mg Oral Daily  . metoprolol tartrate  50 mg Oral BID  . pantoprazole  40 mg Oral Daily  . potassium chloride  40 mEq Oral BID  . sodium chloride flush  3 mL Intravenous Q12H  . triamcinolone  1 application Topical BID  . vitamin B-12  1,000 mcg Oral Daily   Continuous Infusions: . sodium chloride 250 mL (07/08/20 1514)   PRN Meds: sodium chloride, acetaminophen, metoprolol tartrate, ondansetron (ZOFRAN) IV, sodium chloride flush   Vital Signs    Vitals:   07/14/20 0403 07/14/20 0430 07/14/20 0500 07/14/20 0743  BP: 116/78 125/78  114/75  Pulse: 82 84  84  Resp: 17 18  20   Temp: 98.5 F (36.9 C) 98 F (36.7 C)  99 F (37.2 C)  TempSrc: Oral Oral  Oral  SpO2: 92% 92%  90%  Weight:  129 kg 129.4 kg   Height:        Intake/Output Summary (Last 24 hours) at 07/14/2020 1048 Last data filed at 07/14/2020 0942 Gross per 24 hour  Intake 878.23 ml  Output 2525 ml  Net -1646.77 ml   Last 3 Weights 07/14/2020 07/14/2020 07/13/2020  Weight (lbs) 285 lb 4.8 oz 284 lb 6.3 oz 283 lb  Weight (kg) 129.411 kg 129 kg 128.368 kg      Telemetry    NSR 07/14/2020  - Personally Reviewed  ECG    A flutter with RBBB  -  07/13/20   Physical Exam   Physical Exam: Blood pressure 114/75, pulse 84, temperature 99 F (37.2 C), temperature source Oral, resp. rate 20, height 5\' 7"  (1.702 m), weight 129.4 kg, SpO2 90 %.  Obese RomaniaN Lungs clear No murmur Plus 2 LE edema wrapped with mild erythema   Labs    High Sensitivity Troponin:   Recent Labs  Lab 07/07/20 1505 07/07/20 1832  TROPONINIHS  14 17      Chemistry Recent Labs  Lab 07/07/20 1505 07/08/20 0143 07/12/20 0354 07/13/20 0316 07/14/20 0331  NA 137   < > 135 137 136  K 4.2   < > 4.4 4.1 4.3  CL 99   < > 94* 93* 94*  CO2 25   < > 30 32 33*  GLUCOSE 122*   < > 169* 96 103*  BUN 15   < > 17 18 16   CREATININE 0.94   < > 1.21 1.32* 1.19  CALCIUM 9.2   < > 9.2 9.2 9.2  PROT 6.7  --   --   --   --   ALBUMIN 3.2*  --   --   --   --   AST 22  --   --   --   --   ALT 18  --   --   --   --   ALKPHOS 74  --   --   --   --   BILITOT 0.9  --   --   --   --   GFRNONAA >60   < > >60 57* >  60  ANIONGAP 13   < > 11 12 9    < > = values in this interval not displayed.     Hematology Recent Labs  Lab 07/11/20 0134 07/13/20 0316 07/14/20 0331  WBC 7.8 9.4 7.6  RBC 3.50* 3.51* 3.37*  HGB 9.8* 9.7* 9.4*  HCT 32.3* 32.7* 31.3*  MCV 92.3 93.2 92.9  MCH 28.0 27.6 27.9  MCHC 30.3 29.7* 30.0  RDW 14.9 14.9 15.3  PLT 235 231 204    BNP Recent Labs  Lab 07/07/20 1505  BNP 276.1*     DDimer No results for input(s): DDIMER in the last 168 hours.   Radiology    CT ANGIO CHEST PE W OR WO CONTRAST  Result Date: 07/14/2020 CLINICAL DATA:  Chest pain, shortness of breath. EXAM: CT ANGIOGRAPHY CHEST WITH CONTRAST TECHNIQUE: Multidetector CT imaging of the chest was performed using the standard protocol during bolus administration of intravenous contrast. Multiplanar CT image reconstructions and MIPs were obtained to evaluate the vascular anatomy. CONTRAST:  36mL OMNIPAQUE IOHEXOL 350 MG/ML SOLN COMPARISON:  None. FINDINGS: Cardiovascular: Satisfactory opacification of the pulmonary arteries to the segmental level. No evidence of pulmonary embolism. Mild cardiomegaly is noted. Minimal pericardial effusion is noted. Mild coronary artery calcifications are noted. Atherosclerosis of thoracic aorta is noted without aneurysm formation. Mediastinum/Nodes: No enlarged mediastinal, hilar, or axillary lymph nodes. Thyroid gland,  trachea, and esophagus demonstrate no significant findings. Lungs/Pleura: Minimal bilateral pleural effusions are noted with adjacent subsegmental atelectasis. No pneumothorax is noted. Mild right upper lobe atelectasis or infiltrate is noted. Probable subsegmental atelectasis is seen in the lingular aspect of the left upper lobe. Upper Abdomen: No acute abnormality. Musculoskeletal: No chest wall abnormality. No acute or significant osseous findings. Review of the MIP images confirms the above findings. IMPRESSION: 1. No definite evidence of pulmonary embolus. 2. Minimal bilateral pleural effusions are noted with adjacent subsegmental atelectasis. 3. Mild right upper lobe atelectasis or infiltrate is noted. 4. Probable subsegmental atelectasis is seen in the lingular aspect of the left upper lobe. 5. Mild coronary artery calcifications are noted suggesting coronary artery disease. 6. Aortic atherosclerosis. Aortic Atherosclerosis (ICD10-I70.0). Electronically Signed   By: Marijo Conception M.D.   On: 07/14/2020 09:27   ECHO TEE  Result Date: 07/13/2020    TRANSESOPHOGEAL ECHO REPORT   Patient Name:   Shane Diaz Date of Exam: 07/13/2020 Medical Rec #:  [id number]     Height:       67.0 in Accession #:    [id number]    Weight:       283.0 lb Date of Birth:  08/10/1945     BSA:          2.344 m Patient Age:    74 years      BP:           110/83 mmHg Patient Gender: M             HR:           135 bpm. Exam Location:  Inpatient Procedure: Transesophageal Echo, Color Doppler and Cardiac Doppler Indications:     I48.91* Unspecified atrial fibrillation  History:         Patient has prior history of Echocardiogram examinations, most                  recent 07/11/2020. Arrythmias:Atrial Fibrillation; Risk                  Factors:Hypertension, Dyslipidemia  and COVID+ 05/2020.  Sonographer:     Raquel Sarna Senior RDCS Referring Phys:  [place] Diagnosing Phys: Jenkins Rouge MD PROCEDURE: After discussion of the  risks and benefits of a TEE, an informed consent was obtained from the patient. The transesophogeal probe was passed without difficulty through the esophogus of the patient. Sedation performed by different physician. The patient was monitored while under deep sedation. Anesthestetic sedation was provided intravenously by Anesthesiology: 472mg  of Propofol. The patient developed no complications during the procedure. A successful direct current cardioversion was performed at 200 joules with 1 attempt. IMPRESSIONS  1. TEE with no RAA/LAA thrombus DCC x 1 with 200J converted from atrial flutter rate 133 bpm to NSR rate 88 bpm On eliquis with no immediate neurologic sequelae.  2. EF decreased with patient in rapid atrial flutter rate 133. Left ventricular ejection fraction, by estimation, is 45 to 50%. The left ventricle has mildly decreased function. There is mild left ventricular hypertrophy.  3. Right ventricular systolic function is severely reduced. The right ventricular size is severely enlarged.  4. Left atrial size was moderately dilated. No left atrial/left atrial appendage thrombus was detected.  5. Right atrial size was severely dilated.  6. The mitral valve is normal in structure. Mild mitral valve regurgitation.  7. Tricuspid valve regurgitation is moderate.  8. The aortic valve is tricuspid. Aortic valve regurgitation is mild. Mild aortic valve sclerosis is present, with no evidence of aortic valve stenosis. FINDINGS  Left Ventricle: EF decreased with patient in rapid atrial flutter rate 133. Left ventricular ejection fraction, by estimation, is 45 to 50%. The left ventricle has mildly decreased function. The left ventricular internal cavity size was normal in size. There is mild left ventricular hypertrophy. Right Ventricle: The right ventricular size is severely enlarged. Right vetricular wall thickness was not assessed. Right ventricular systolic function is severely reduced. Left Atrium: Left atrial  size was moderately dilated. No left atrial/left atrial appendage thrombus was detected. Right Atrium: Right atrial size was severely dilated. Pericardium: There is no evidence of pericardial effusion. Mitral Valve: The mitral valve is normal in structure. Mild mitral valve regurgitation. Tricuspid Valve: The tricuspid valve is normal in structure. Tricuspid valve regurgitation is moderate. Aortic Valve: The aortic valve is tricuspid. Aortic valve regurgitation is mild. Mild aortic valve sclerosis is present, with no evidence of aortic valve stenosis. Pulmonic Valve: The pulmonic valve was normal in structure. Pulmonic valve regurgitation is mild. Aorta: The aortic root is normal in size and structure. IAS/Shunts: No atrial level shunt detected by color flow Doppler. Additional Comments: TEE with no RAA/LAA thrombus DCC x 1 with 200J converted from atrial flutter rate 133 bpm to NSR rate 88 bpm On eliquis with no immediate neurologic sequelae. Jenkins Rouge MD Electronically signed by Jenkins Rouge MD Signature Date/Time: 07/13/2020/12:39:55 PM    Final     Cardiac Studies   Echo 07/11/20 IMPRESSIONS    1. Left ventricular ejection fraction, by estimation, is 50 to 55%. The  left ventricle has low normal function. The left ventricle has no regional  wall motion abnormalities. There is moderate left ventricular hypertrophy.  Left ventricular diastolic  parameters are indeterminate.  2. Right ventricular systolic function is mildly reduced. The right  ventricular size is mildly enlarged. There is mildly elevated pulmonary  artery systolic pressure. The estimated right ventricular systolic  pressure is 24.2 mmHg.  3. Right atrial size was mildly dilated.  4. The mitral valve is normal in structure.  Trivial mitral valve  regurgitation.  5. The aortic valve was not well visualized. Aortic valve regurgitation  is not visualized. No aortic stenosis is present.  6. Aortic dilatation noted. There is  mild dilatation of the ascending  aorta, measuring 38 mm.  7. The inferior vena cava is dilated in size with <50% respiratory  variability, suggesting right atrial pressure of 15 mmHg.   FINDINGS  Left Ventricle: Left ventricular ejection fraction, by estimation, is 50  to 55%. The left ventricle has low normal function. The left ventricle has  no regional wall motion abnormalities. The left ventricular internal  cavity size was normal in size.  There is moderate left ventricular hypertrophy. Left ventricular diastolic  parameters are indeterminate.   Right Ventricle: The right ventricular size is mildly enlarged. Right  vetricular wall thickness was not assessed. Right ventricular systolic  function is mildly reduced. There is mildly elevated pulmonary artery  systolic pressure. The tricuspid  regurgitant velocity is 2.39 m/s, and with an assumed right atrial  pressure of 15 mmHg, the estimated right ventricular systolic pressure is  58.5 mmHg.   Left Atrium: Left atrial size was normal in size.   Right Atrium: Right atrial size was mildly dilated.   Pericardium: Trivial pericardial effusion is present. Presence of  pericardial fat pad.   Mitral Valve: The mitral valve is normal in structure. Trivial mitral  valve regurgitation.   Tricuspid Valve: The tricuspid valve is normal in structure. Tricuspid  valve regurgitation is trivial.   Aortic Valve: The aortic valve was not well visualized. Aortic valve  regurgitation is not visualized. No aortic stenosis is present.   Pulmonic Valve: The pulmonic valve was not well visualized. Pulmonic valve  regurgitation is not visualized.   Aorta: The aortic root is normal in size and structure and aortic  dilatation noted. There is mild dilatation of the ascending aorta,  measuring 38 mm.   Venous: The inferior vena cava is dilated in size with less than 50%  respiratory variability, suggesting right atrial pressure of 15 mmHg.    IAS/Shunts: The interatrial septum was not well visualized.   Patient Profile     74 y.o. male admitted with dyspnea and an abrupt fall in his Hb. He was also found to have atrial fib  Assessment & Plan   1.  PAF:  Flutter post TEE/DCC 12/10 in NSR continue eliquis and lopressor   2.  Acute GI bleed: He dropped his hemoglobin 7 g over the course of 2 months.  He is heme-positive.   GI did not find any source of bleeding with colonoscopy or egd on anticoagulation  3. Edema:  Continue iv bid diureses and antibiotic coverage improving slowly       Jenkins Rouge, MD  07/14/2020 10:48 AM    [address],  [place], [place]  [address] Phone: [phone]; Fax: [phone]

## 2020-07-14 NOTE — TOC Transition Note (Signed)
Transition of Care [facility]) - CM/SW Discharge Note   Patient Details  Name: Shane Diaz MRN: [id number] Date of Birth: [birth year]  Transition of Care [facility]) CM/SW Contact:  Ella Bodo, RN Phone Number: 07/14/2020, 3:40 PM   Clinical Narrative:[name] is a 74 y.o. male with medical history significant of paroxysmal A. Fib, bilateral DVT and Eliquis, morbid obesity, OSA, prediabetes, presented with worsening of palpitations and worsening bilat LE swelling, itchiness with clear liquid discharge. Found to have A-flutter.  Prior to admission, patient independent and living at home with spouse.  PT recommending home health follow-up;  patient would benefit from home health RN for disease management, but patient declines any home health follow-up at this time.  He states that his wife is able to provide any services that he needs at home.    Final next level of care: Home/Self Care Barriers to Discharge: Barriers Resolved   Patient Goals and CMS Choice Patient states their goals for this hospitalization and ongoing recovery are:: get better CMS Medicare.gov Compare Post Acute Care list provided to:: Patient Choice offered to / list presented to : Patient                      Discharge Plan and Services   Discharge Planning Services: CM Consult              DME Agency: NA       HH Arranged: Patient Refused [facility]          Social Determinants of Health (SDOH) Interventions     Readmission Risk Interventions No flowsheet data found.  [name], RN, BSN  Trauma/Neuro ICU Case Manager [phone]

## 2020-07-14 NOTE — Anesthesia Postprocedure Evaluation (Signed)
Anesthesia Post Note  Patient: Shane Diaz  Procedure(s) Performed: TRANSESOPHAGEAL ECHOCARDIOGRAM (TEE) (N/A ) CARDIOVERSION (N/A )     Patient location during evaluation: Endoscopy Anesthesia Type: General Level of consciousness: awake Pain management: pain level controlled Vital Signs Assessment: post-procedure vital signs reviewed and stable Respiratory status: spontaneous breathing, nonlabored ventilation, respiratory function stable and patient connected to nasal cannula oxygen Cardiovascular status: blood pressure returned to baseline and stable Postop Assessment: no apparent nausea or vomiting Anesthetic complications: no   No complications documented.  Last Vitals:  Vitals:   07/14/20 0403 07/14/20 0430  BP: 116/78 125/78  Pulse: 82 84  Resp: 17 18  Temp: 36.9 C 36.7 C  SpO2: 92% 92%    Last Pain:  Vitals:   07/14/20 0430  TempSrc: Oral  PainSc:                  Karyl Kinnier Anayeli Arel

## 2020-07-15 ENCOUNTER — Encounter (HOSPITAL_COMMUNITY): Payer: Self-pay | Admitting: Cardiovascular Disease

## 2020-07-15 NOTE — Discharge Summary (Signed)
Physician Discharge Summary  Lisa Blakeman BTD:[id number] DOB: 05/18/1946 DOA: 07/07/2020  PCP: [name], MD  Admit date: 07/07/2020 Discharge date: 07/15/2020  Admitted From: Home Disposition:  Home  Recommendations for Outpatient Follow-up:  1. Follow up with PCP in 1 week 2. Follow up with Cardiology in 1 week. 3. 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   He was also noted to be anemic with occult positive stool while on Eliquis.  Lastly, he was managed for bilateral lower extremity non-purulent cellulitis.  Acute Hypoxic Respiratory Failure - CT Scan showed no evidence of PE.  Oxygen was weaned from 6L [place] to room air after aggressive diuresis.  On day of discharge, patient was able to ambulate without desaturation.  Acute on Chronic Diastolic Heart Failure - 36/4/68 Echo showed EF 50-55% and indeterminate diastolic function. - 07/13/20 TEE showed LV EF 45-50%. - 05/08/20 Echo showed EF 60-65% and Grade 2 diastolic dysfunction. - Cardiology was consulted who managed patient with IV Lasix.  He was discharged on Lasix 40 mg PO QD.  He was also discharged on a beta blocker.  Blood pressure was ~ 120/80 and heart rate was 65-70 beats /minute.  Recommend ACE/ARB if blood pressure allows.  He will follow up with Cardiology outpatient.    New Onset Atrial Fibrillation - Patient was started on Amiodarone Drip as well as Lopressor 50 mg BID with persistent tachycardia.  On 12/10 he underwent cardioversion.   Heart rate was stable the following day.  He was weaned off the Amiodarone drip and discharged on Lopressor 50 mg BID for rate control. - He will continue Eliquis 5 mg BID for anticoagulation.  Acute GI Bleed while on Eliquis - Endoscopy showed mild gastritis and Colonoscopy showed moderate diverticulosis and internal hemorrhoids.  There was no evidence of bleeding - Patient resumed Eliquis without any issues. - He was discharged on Ferrous Sulfate 325 mg PO BID and Protonix 40 mg PO QD.    Bilateral Lower Extremity Cellulitis, non-purulent - Patient was treated with IV antibiotics for 7 days.  He was discharged on 3 days of Augmentin 875-125 mg PO BID to complete a 10 day course.  Obstructive Sleep Apnea - Echo showed severe RAE and severe RVE with hypokinesis. - Recommend wear CPAP nightly  Morbid Obesity - Counseled on weight loss.  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  Venous Stasis - Continue wound care outpatient.   Discharge Diagnoses:  Active Problems:   A-fib (HCC)   Iron deficiency anemia   Occult blood in stools  Discharge Instructions  Discharge Instructions    Diet - low sodium heart healthy   Complete by: As directed    Discharge wound care:   Complete by: As directed    Clean legs daily and apply xeroform to anterior and posterior lower leg, cover with abdominal pad, and secure with kerlix and ace wrap.  Change daily.   Increase activity slowly   Complete by: As directed  Allergies as of 07/14/2020   No Known Allergies     Medication List    STOP taking these medications   dexamethasone 6 MG tablet Commonly known as: DECADRON     TAKE these medications   amoxicillin-clavulanate 875-125 MG tablet Commonly known as: Augmentin Take 1 tablet by mouth 2 (two) times daily for 3 days.   apixaban 5 MG Tabs tablet Commonly known as: Eliquis Take  1 tablet (5 mg total) by mouth 2 (two) times daily.   atorvastatin 40 MG tablet Commonly known as: Lipitor Take 1 tablet (40 mg total) by mouth daily.   cyanocobalamin 1000 MCG tablet Take 1 tablet (1,000 mcg total) by mouth daily.   ferrous sulfate 325 (65 FE) MG tablet Take 1 tablet (325 mg total) by mouth 2 (two) times daily with a meal.   furosemide 40 MG tablet Commonly known as: Lasix Take 1 tablet (40 mg total) by mouth daily.   gatifloxacin 0.5 % Soln Commonly known as: ZYMAXID Place 1 drop into the left eye 4 (four) times daily.   metoprolol tartrate 50 MG tablet Commonly known as: LOPRESSOR Take 1 tablet (50 mg total) by mouth 2 (two) times daily. What changed:   medication strength  how much to take   pantoprazole 40 MG tablet Commonly known as: Protonix Take 1 tablet (40 mg total) by mouth daily.            Discharge Care Instructions  (From admission, onward)         Start     Ordered   07/14/20 0000  Discharge wound care:       Comments: Clean legs daily and apply xeroform to anterior and posterior lower leg, cover with abdominal pad, and secure with kerlix and ace wrap.  Change daily.   07/14/20 1432          Follow-up Information    [name], MD Follow up.   Specialty: Internal Medicine Contact information: [address] [place] [address] [phone]        Donato Heinz, MD .   Specialties: Cardiology, Radiology Contact information: [address] [place] [address] [phone]              No Known Allergies  Consultations:  Cardiologoy  Gastroenterology   Procedures/Studies: CT ANGIO CHEST PE W OR WO CONTRAST  Result Date: 07/14/2020 CLINICAL DATA:  Chest pain, shortness of breath. EXAM: CT ANGIOGRAPHY CHEST WITH CONTRAST TECHNIQUE: Multidetector CT imaging of the chest was performed using the standard protocol during bolus administration of intravenous contrast.  Multiplanar CT image reconstructions and MIPs were obtained to evaluate the vascular anatomy. CONTRAST:  77mL OMNIPAQUE IOHEXOL 350 MG/ML SOLN COMPARISON:  None. FINDINGS: Cardiovascular: Satisfactory opacification of the pulmonary arteries to the segmental level. No evidence of pulmonary embolism. Mild cardiomegaly is noted. Minimal pericardial effusion is noted. Mild coronary artery calcifications are noted. Atherosclerosis of thoracic aorta is noted without aneurysm formation. Mediastinum/Nodes: No enlarged mediastinal, hilar, or axillary lymph nodes. Thyroid gland, trachea, and esophagus demonstrate no significant findings. Lungs/Pleura: Minimal bilateral pleural effusions are noted with adjacent subsegmental atelectasis. No pneumothorax is noted. Mild right upper lobe atelectasis or infiltrate is noted. Probable subsegmental atelectasis is seen in the lingular aspect of the left upper lobe. Upper Abdomen: No acute abnormality. Musculoskeletal: No chest wall abnormality. No acute or significant osseous findings. Review of the MIP images confirms the above findings. IMPRESSION: 1. No definite evidence of pulmonary embolus. 2.  Minimal bilateral pleural effusions are noted with adjacent subsegmental atelectasis. 3. Mild right upper lobe atelectasis or infiltrate is noted. 4. Probable subsegmental atelectasis is seen in the lingular aspect of the left upper lobe. 5. Mild coronary artery calcifications are noted suggesting coronary artery disease. 6. Aortic atherosclerosis. Aortic Atherosclerosis (ICD10-I70.0). Electronically Signed   By: Marijo Conception M.D.   On: 07/14/2020 09:27   DG Chest Port 1 View  Result Date: 07/07/2020 CLINICAL DATA:  Questionable sepsis.  Evaluate for abnormality. EXAM: PORTABLE CHEST 1 VIEW COMPARISON:  May 07, 2020 FINDINGS: Stable cardiomegaly. Mild atelectasis in the left base. No pneumothorax. The lungs are clear. No other abnormalities. IMPRESSION: No active disease.  Electronically Signed   By: Dorise Bullion III M.D   On: 07/07/2020 15:53   ECHOCARDIOGRAM COMPLETE  Result Date: 07/11/2020    ECHOCARDIOGRAM REPORT   Patient Name:   ALBARO DEVINEY Date of Exam: 07/11/2020 Medical Rec #:  [id number]     Height:       67.0 in Accession #:    [id number]    Weight:       289.9 lb Date of Birth:  08-19-1945     BSA:          2.368 m Patient Age:    57 years      BP:           114/80 mmHg Patient Gender: M             HR:           125 bpm. Exam Location:  Inpatient Procedure: 2D Echo and Intracardiac Opacification Agent Indications:    CHF- Acute Diastolic K24.09  History:        Patient has prior history of Echocardiogram examinations, most                 recent 05/08/2020. Risk Factors:Hypertension and Dyslipidemia.  Sonographer:    Mikki Santee RDCS (AE) Referring Phys: [id number] Lequita Halt IMPRESSIONS  1. Left ventricular ejection fraction, by estimation, is 50 to 55%. The left ventricle has low normal function. The left ventricle has no regional wall motion abnormalities. There is moderate left ventricular hypertrophy. Left ventricular diastolic parameters are indeterminate.  2. Right ventricular systolic function is mildly reduced. The right ventricular size is mildly enlarged. There is mildly elevated pulmonary artery systolic pressure. The estimated right ventricular systolic pressure is 24.2 mmHg.  3. Right atrial size was mildly dilated.  4. The mitral valve is normal in structure. Trivial mitral valve regurgitation.  5. The aortic valve was not well visualized. Aortic valve regurgitation is not visualized. No aortic stenosis is present.  6. Aortic dilatation noted. There is mild dilatation of the ascending aorta, measuring 38 mm.  7. The inferior vena cava is dilated in size with <50% respiratory variability, suggesting right atrial pressure of 15 mmHg. FINDINGS  Left Ventricle: Left ventricular ejection fraction, by estimation, is 50 to 55%. The left ventricle has  low normal function. The left ventricle has no regional wall motion abnormalities. The left ventricular internal cavity size was normal in size. There is moderate left ventricular hypertrophy. Left ventricular diastolic parameters are indeterminate. Right Ventricle: The right ventricular size is mildly enlarged. Right vetricular wall thickness was not assessed. Right ventricular systolic function is mildly reduced. There is mildly elevated pulmonary artery systolic pressure. The tricuspid regurgitant velocity is 2.39 m/s, and with an assumed right atrial pressure of 15 mmHg, the estimated right ventricular  systolic pressure is 53.6 mmHg. Left Atrium: Left atrial size was normal in size. Right Atrium: Right atrial size was mildly dilated. Pericardium: Trivial pericardial effusion is present. Presence of pericardial fat pad. Mitral Valve: The mitral valve is normal in structure. Trivial mitral valve regurgitation. Tricuspid Valve: The tricuspid valve is normal in structure. Tricuspid valve regurgitation is trivial. Aortic Valve: The aortic valve was not well visualized. Aortic valve regurgitation is not visualized. No aortic stenosis is present. Pulmonic Valve: The pulmonic valve was not well visualized. Pulmonic valve regurgitation is not visualized. Aorta: The aortic root is normal in size and structure and aortic dilatation noted. There is mild dilatation of the ascending aorta, measuring 38 mm. Venous: The inferior vena cava is dilated in size with less than 50% respiratory variability, suggesting right atrial pressure of 15 mmHg. IAS/Shunts: The interatrial septum was not well visualized.  LEFT VENTRICLE PLAX 2D LVIDd:         4.00 cm LVIDs:         2.80 cm LV PW:         1.40 cm LV IVS:        1.40 cm LVOT diam:     2.40 cm LV SV:         59 LV SV Index:   25 LVOT Area:     4.52 cm  RIGHT VENTRICLE TAPSE (M-mode): 1.3 cm LEFT ATRIUM             Index       RIGHT ATRIUM           Index LA diam:        4.70 cm  1.98 cm/m  RA Area:     24.90 cm LA Vol (A2C):   97.1 ml 41.01 ml/m RA Volume:   81.10 ml  34.25 ml/m LA Vol (A4C):   49.8 ml 21.03 ml/m LA Biplane Vol: 71.3 ml 30.11 ml/m  AORTIC VALVE LVOT Vmax:   71.80 cm/s LVOT Vmean:  47.800 cm/s LVOT VTI:    0.130 m  AORTA Ao Root diam: 3.50 cm Ao Asc diam:  3.80 cm TRICUSPID VALVE TR Peak grad:   22.8 mmHg TR Vmax:        239.00 cm/s  SHUNTS Systemic VTI:  0.13 m Systemic Diam: 2.40 cm Oswaldo Milian MD Electronically signed by Oswaldo Milian MD Signature Date/Time: 07/11/2020/4:44:06 PM    Final    ECHO TEE  Result Date: 07/13/2020    TRANSESOPHOGEAL ECHO REPORT   Patient Name:   WILLIE PLAIN Date of Exam: 07/13/2020 Medical Rec #:  [id number]     Height:       67.0 in Accession #:    [id number]    Weight:       283.0 lb Date of Birth:  March 09, 1946     BSA:          2.344 m Patient Age:    74 years      BP:           110/83 mmHg Patient Gender: M             HR:           135 bpm. Exam Location:  Inpatient Procedure: Transesophageal Echo, Color Doppler and Cardiac Doppler Indications:     I48.91* Unspecified atrial fibrillation  History:         Patient has prior history of Echocardiogram examinations, most  recent 07/11/2020. Arrythmias:Atrial Fibrillation; Risk                  Factors:Hypertension, Dyslipidemia and COVID+ 05/2020.  Sonographer:     Raquel Sarna Senior RDCS Referring Phys:  [place] Diagnosing Phys: Jenkins Rouge MD PROCEDURE: After discussion of the risks and benefits of a TEE, an informed consent was obtained from the patient. The transesophogeal probe was passed without difficulty through the esophogus of the patient. Sedation performed by different physician. The patient was monitored while under deep sedation. Anesthestetic sedation was provided intravenously by Anesthesiology: 472mg  of Propofol. The patient developed no complications during the procedure. A successful direct current cardioversion was performed  at 200 joules with 1 attempt. IMPRESSIONS  1. TEE with no RAA/LAA thrombus DCC x 1 with 200J converted from atrial flutter rate 133 bpm to NSR rate 88 bpm On eliquis with no immediate neurologic sequelae.  2. EF decreased with patient in rapid atrial flutter rate 133. Left ventricular ejection fraction, by estimation, is 45 to 50%. The left ventricle has mildly decreased function. There is mild left ventricular hypertrophy.  3. Right ventricular systolic function is severely reduced. The right ventricular size is severely enlarged.  4. Left atrial size was moderately dilated. No left atrial/left atrial appendage thrombus was detected.  5. Right atrial size was severely dilated.  6. The mitral valve is normal in structure. Mild mitral valve regurgitation.  7. Tricuspid valve regurgitation is moderate.  8. The aortic valve is tricuspid. Aortic valve regurgitation is mild. Mild aortic valve sclerosis is present, with no evidence of aortic valve stenosis. FINDINGS  Left Ventricle: EF decreased with patient in rapid atrial flutter rate 133. Left ventricular ejection fraction, by estimation, is 45 to 50%. The left ventricle has mildly decreased function. The left ventricular internal cavity size was normal in size. There is mild left ventricular hypertrophy. Right Ventricle: The right ventricular size is severely enlarged. Right vetricular wall thickness was not assessed. Right ventricular systolic function is severely reduced. Left Atrium: Left atrial size was moderately dilated. No left atrial/left atrial appendage thrombus was detected. Right Atrium: Right atrial size was severely dilated. Pericardium: There is no evidence of pericardial effusion. Mitral Valve: The mitral valve is normal in structure. Mild mitral valve regurgitation. Tricuspid Valve: The tricuspid valve is normal in structure. Tricuspid valve regurgitation is moderate. Aortic Valve: The aortic valve is tricuspid. Aortic valve regurgitation is mild.  Mild aortic valve sclerosis is present, with no evidence of aortic valve stenosis. Pulmonic Valve: The pulmonic valve was normal in structure. Pulmonic valve regurgitation is mild. Aorta: The aortic root is normal in size and structure. IAS/Shunts: No atrial level shunt detected by color flow Doppler. Additional Comments: TEE with no RAA/LAA thrombus DCC x 1 with 200J converted from atrial flutter rate 133 bpm to NSR rate 88 bpm On eliquis with no immediate neurologic sequelae. Jenkins Rouge MD Electronically signed by Jenkins Rouge MD Signature Date/Time: 07/13/2020/12:39:55 PM    Final    US Abdomen Limited RUQ (LIVER/GB)  Result Date: 07/08/2020 CLINICAL DATA:  Fatty liver.  Morbid obesity. EXAM: ULTRASOUND ABDOMEN LIMITED RIGHT UPPER QUADRANT COMPARISON:  05/14/2015 FINDINGS: Gallbladder: Tiny gallstones evident measuring up to 10 mm. Gallbladder is incompletely distended with wall thickness measuring 7-8 mm. Common bile duct: Diameter: 4-5 mm Liver: Echogenic liver parenchyma suggests fatty deposition. Portal vein is patent on color Doppler imaging with normal direction of blood flow towards the liver. Other: None. IMPRESSION: 1. Cholelithiasis with  gallbladder wall thickening. Gallbladder is not fully distended which could accentuate wall thickness. If clinical picture is equivocal for acute cholecystitis, nuclear scintigraphy may prove helpful to further evaluate. Electronically Signed   By: Misty Stanley M.D.   On: 07/08/2020 09:50    Subjective: Patient was discharged in stable condition.  Discharge Exam: Vitals:   07/14/20 0743 07/14/20 1107  BP: 114/75 125/76  Pulse: 84 74  Resp: 20 20  Temp: 99 F (37.2 C) 98.2 F (36.8 C)  SpO2: 90% 91%   Vitals:   07/14/20 0430 07/14/20 0500 07/14/20 0743 07/14/20 1107  BP: 125/78  114/75 125/76  Pulse: 84  84 74  Resp: 18  20 20   Temp: 98 F (36.7 C)  99 F (37.2 C) 98.2 F (36.8 C)  TempSrc: Oral  Oral Oral  SpO2: 92%  90% 91%  Weight:  129 kg 129.4 kg    Height:        General: Pt is alert, awake, not in acute distress Cardiovascular: RRR, S1/S2 +, no rubs, no gallops Respiratory: CTA bilaterally, no wheezing, no rhonchi Abdominal: Soft, NT, ND, bowel sounds + Extremities: no edema, no cyanosis   The results of significant diagnostics from this hospitalization (including imaging, microbiology, ancillary and laboratory) are listed below for reference.     Microbiology: Recent Results (from the past 240 hour(s))  Blood Culture (routine x 2)     Status: None   Collection Time: 07/07/20  3:50 PM   Specimen: BLOOD RIGHT FOREARM  Result Value Ref Range Status   Specimen Description BLOOD RIGHT FOREARM  Final   Special Requests   Final    BOTTLES DRAWN AEROBIC ONLY Blood Culture results may not be optimal due to an inadequate volume of blood received in culture bottles   Culture   Final    NO GROWTH 5 DAYS Performed at [facility], [place] [address] [place], [place] [address]    Report Status 07/12/2020 FINAL  Final  Resp Panel by RT-PCR (Flu A&B, Covid) Nasopharyngeal Swab     Status: None   Collection Time: 07/07/20  3:50 PM   Specimen: Nasopharyngeal Swab; Nasopharyngeal(NP) swabs in vial transport medium  Result Value Ref Range Status   SARS Coronavirus 2 by RT PCR NEGATIVE NEGATIVE Final    Comment: (NOTE) SARS-CoV-2 target nucleic acids are NOT DETECTED.  The SARS-CoV-2 RNA is generally detectable in upper respiratory specimens during the acute phase of infection. The lowest concentration of SARS-CoV-2 viral copies this assay can detect is 138 copies/mL. A negative result does not preclude SARS-Cov-2 infection and should not be used as the sole basis for treatment or other patient management decisions. A negative result may occur with  improper specimen collection/handling, submission of specimen other than nasopharyngeal swab, presence of viral mutation(s) within the areas targeted by this assay,  and inadequate number of viral copies(<138 copies/mL). A negative result must be combined with clinical observations, patient history, and epidemiological information. The expected result is Negative.  Fact Sheet for Patients:  EntrepreneurPulse.com.au  Fact Sheet for Healthcare Providers:  IncredibleEmployment.be  This test is no t yet approved or cleared by the [place] FDA and  has been authorized for detection and/or diagnosis of SARS-CoV-2 by FDA under an Emergency Use Authorization (EUA). This EUA will remain  in effect (meaning this test can be used) for the duration of the COVID-19 declaration under Section 564(b)(1) of the Act, 21 U.S.C.section 360bbb-3(b)(1), unless the authorization is terminated  or revoked sooner.       Influenza A by PCR NEGATIVE NEGATIVE Final   Influenza B by PCR NEGATIVE NEGATIVE Final    Comment: (NOTE) The Xpert Xpress SARS-CoV-2/FLU/RSV plus assay is intended as an aid in the diagnosis of influenza from Nasopharyngeal swab specimens and should not be used as a sole basis for treatment. Nasal washings and aspirates are unacceptable for Xpert Xpress SARS-CoV-2/FLU/RSV testing.  Fact Sheet for Patients: EntrepreneurPulse.com.au  Fact Sheet for Healthcare Providers: IncredibleEmployment.be  This test is not yet approved or cleared by the [place] FDA and has been authorized for detection and/or diagnosis of SARS-CoV-2 by FDA under an Emergency Use Authorization (EUA). This EUA will remain in effect (meaning this test can be used) for the duration of the COVID-19 declaration under Section 564(b)(1) of the Act, 21 U.S.C. section 360bbb-3(b)(1), unless the authorization is terminated or revoked.  Performed at [facility], [place] [address], [place], [place] [address]   Blood Culture (routine x 2)     Status: None   Collection Time: 07/07/20  3:51 PM    Specimen: BLOOD  Result Value Ref Range Status   Specimen Description BLOOD RIGHT ANTECUBITAL  Final   Special Requests   Final    BOTTLES DRAWN AEROBIC AND ANAEROBIC Blood Culture results may not be optimal due to an inadequate volume of blood received in culture bottles   Culture   Final    NO GROWTH 5 DAYS Performed at [facility], [place] [address] [place], [place] [address]    Report Status 07/12/2020 FINAL  Final     Labs: BNP (last 3 results) Recent Labs    05/07/20 2230 07/07/20 1505  BNP 273.2* 361.4*   Basic Metabolic Panel: Recent Labs  Lab 07/09/20 0553 07/10/20 0155 07/11/20 0134 07/12/20 0354 07/13/20 0316 07/14/20 0331  NA 136 136 138 135 137 136  K 3.2* 3.4* 3.7 4.4 4.1 4.3  CL 97* 97* 96* 94* 93* 94*  CO2 29 28 30 30  32 33*  GLUCOSE 100* 97 103* 169* 96 103*  BUN 17 17 14 17 18 16   CREATININE 1.10 1.08 1.06 1.21 1.32* 1.19  CALCIUM 8.8* 8.8* 9.1 9.2 9.2 9.2  MG 1.6* 1.8  --   --  1.6* 1.8  PHOS 3.2  --   --   --   --   --   CBC: Recent Labs  Lab 07/09/20 0553 07/10/20 0155 07/11/20 0134 07/13/20 0316 07/14/20 0331  WBC 7.5 7.2 7.8 9.4 7.6  NEUTROABS 4.9 4.7 4.8 5.6 4.3  HGB 9.5* 9.3* 9.8* 9.7* 9.4*  HCT 31.4* 29.5* 32.3* 32.7* 31.3*  MCV 94.3 91.6 92.3 93.2 92.9  PLT 207 207 235 231 204   Microbiology Recent Results (from the past 240 hour(s))  Blood Culture (routine x 2)     Status: None   Collection Time: 07/07/20  3:50 PM   Specimen: BLOOD RIGHT FOREARM  Result Value Ref Range Status   Specimen Description BLOOD RIGHT FOREARM  Final   Special Requests   Final    BOTTLES DRAWN AEROBIC ONLY Blood Culture results may not be optimal due to an inadequate volume of blood received in culture bottles   Culture   Final    NO GROWTH 5 DAYS Performed at [facility], [place] [address], [place], [place] [address]    Report Status 07/12/2020 FINAL  Final  Resp Panel by RT-PCR (Flu A&B, Covid) Nasopharyngeal Swab  Status: None    Collection Time: 07/07/20  3:50 PM   Specimen: Nasopharyngeal Swab; Nasopharyngeal(NP) swabs in vial transport medium  Result Value Ref Range Status   SARS Coronavirus 2 by RT PCR NEGATIVE NEGATIVE Final    Comment: (NOTE) SARS-CoV-2 target nucleic acids are NOT DETECTED.  The SARS-CoV-2 RNA is generally detectable in upper respiratory specimens during the acute phase of infection. The lowest concentration of SARS-CoV-2 viral copies this assay can detect is 138 copies/mL. A negative result does not preclude SARS-Cov-2 infection and should not be used as the sole basis for treatment or other patient management decisions. A negative result may occur with  improper specimen collection/handling, submission of specimen other than nasopharyngeal swab, presence of viral mutation(s) within the areas targeted by this assay, and inadequate number of viral copies(<138 copies/mL). A negative result must be combined with clinical observations, patient history, and epidemiological information. The expected result is Negative.  Fact Sheet for Patients:  EntrepreneurPulse.com.au  Fact Sheet for Healthcare Providers:  IncredibleEmployment.be  This test is no t yet approved or cleared by the [place] FDA and  has been authorized for detection and/or diagnosis of SARS-CoV-2 by FDA under an Emergency Use Authorization (EUA). This EUA will remain  in effect (meaning this test can be used) for the duration of the COVID-19 declaration under Section 564(b)(1) of the Act, 21 U.S.C.section 360bbb-3(b)(1), unless the authorization is terminated  or revoked sooner.       Influenza A by PCR NEGATIVE NEGATIVE Final   Influenza B by PCR NEGATIVE NEGATIVE Final    Comment: (NOTE) The Xpert Xpress SARS-CoV-2/FLU/RSV plus assay is intended as an aid in the diagnosis of influenza from Nasopharyngeal swab specimens and should not be used as a sole basis for treatment.  Nasal washings and aspirates are unacceptable for Xpert Xpress SARS-CoV-2/FLU/RSV testing.  Fact Sheet for Patients: EntrepreneurPulse.com.au  Fact Sheet for Healthcare Providers: IncredibleEmployment.be  This test is not yet approved or cleared by the [place] FDA and has been authorized for detection and/or diagnosis of SARS-CoV-2 by FDA under an Emergency Use Authorization (EUA). This EUA will remain in effect (meaning this test can be used) for the duration of the COVID-19 declaration under Section 564(b)(1) of the Act, 21 U.S.C. section 360bbb-3(b)(1), unless the authorization is terminated or revoked.  Performed at [facility], [place] [address] [place], [place] [address]   Blood Culture (routine x 2)     Status: None   Collection Time: 07/07/20  3:51 PM   Specimen: BLOOD  Result Value Ref Range Status   Specimen Description BLOOD RIGHT ANTECUBITAL  Final   Special Requests   Final    BOTTLES DRAWN AEROBIC AND ANAEROBIC Blood Culture results may not be optimal due to an inadequate volume of blood received in culture bottles   Culture   Final    NO GROWTH 5 DAYS Performed at [facility], [place] [address] [place], [place] [address]    Report Status 07/12/2020 FINAL  Final    Time coordinating discharge: Over 30 minutes  SIGNED:   George Hugh, MD  [facility] 07/15/2020, 1:17 PM Pager

## 2020-07-16 SURGERY — ECHOCARDIOGRAM, TRANSESOPHAGEAL
Anesthesia: Moderate Sedation

## 2020-08-01 ENCOUNTER — Other Ambulatory Visit: Payer: Self-pay

## 2020-08-01 ENCOUNTER — Emergency Department (HOSPITAL_COMMUNITY): Payer: Medicare Other

## 2020-08-01 ENCOUNTER — Encounter (HOSPITAL_COMMUNITY): Payer: Self-pay | Admitting: Emergency Medicine

## 2020-08-01 ENCOUNTER — Inpatient Hospital Stay (HOSPITAL_COMMUNITY)
Admission: EM | Admit: 2020-08-01 | Discharge: 2020-08-03 | DRG: 308 | Disposition: A | Discharge status: Home / Self Care | Payer: Medicare Other | Attending: Cardiology | Admitting: Cardiology

## 2020-08-01 DIAGNOSIS — G4733 Obstructive sleep apnea (adult) (pediatric): Secondary | ICD-10-CM | POA: Diagnosis present

## 2020-08-01 DIAGNOSIS — E785 Hyperlipidemia, unspecified: Secondary | ICD-10-CM | POA: Diagnosis present

## 2020-08-01 DIAGNOSIS — I82403 Acute embolism and thrombosis of unspecified deep veins of lower extremity, bilateral: Secondary | ICD-10-CM | POA: Diagnosis present

## 2020-08-01 DIAGNOSIS — K760 Fatty (change of) liver, not elsewhere classified: Secondary | ICD-10-CM | POA: Diagnosis present

## 2020-08-01 DIAGNOSIS — I4891 Unspecified atrial fibrillation: Secondary | ICD-10-CM | POA: Diagnosis not present

## 2020-08-01 DIAGNOSIS — I480 Paroxysmal atrial fibrillation: Secondary | ICD-10-CM | POA: Diagnosis not present

## 2020-08-01 DIAGNOSIS — I5033 Acute on chronic diastolic (congestive) heart failure: Secondary | ICD-10-CM | POA: Diagnosis not present

## 2020-08-01 DIAGNOSIS — M1990 Unspecified osteoarthritis, unspecified site: Secondary | ICD-10-CM | POA: Diagnosis present

## 2020-08-01 DIAGNOSIS — I87009 Postthrombotic syndrome without complications of unspecified extremity: Secondary | ICD-10-CM | POA: Diagnosis present

## 2020-08-01 DIAGNOSIS — Z79899 Other long term (current) drug therapy: Secondary | ICD-10-CM

## 2020-08-01 DIAGNOSIS — Z7902 Long term (current) use of antithrombotics/antiplatelets: Secondary | ICD-10-CM

## 2020-08-01 DIAGNOSIS — Z20822 Contact with and (suspected) exposure to covid-19: Secondary | ICD-10-CM | POA: Diagnosis present

## 2020-08-01 DIAGNOSIS — I2510 Atherosclerotic heart disease of native coronary artery without angina pectoris: Secondary | ICD-10-CM | POA: Diagnosis present

## 2020-08-01 DIAGNOSIS — I5031 Acute diastolic (congestive) heart failure: Secondary | ICD-10-CM | POA: Diagnosis present

## 2020-08-01 DIAGNOSIS — Z86718 Personal history of other venous thrombosis and embolism: Secondary | ICD-10-CM

## 2020-08-01 DIAGNOSIS — I10 Essential (primary) hypertension: Secondary | ICD-10-CM | POA: Diagnosis present

## 2020-08-01 DIAGNOSIS — I110 Hypertensive heart disease with heart failure: Secondary | ICD-10-CM | POA: Diagnosis present

## 2020-08-01 DIAGNOSIS — R7303 Prediabetes: Secondary | ICD-10-CM | POA: Diagnosis present

## 2020-08-01 DIAGNOSIS — I4892 Unspecified atrial flutter: Secondary | ICD-10-CM | POA: Diagnosis present

## 2020-08-01 DIAGNOSIS — I2584 Coronary atherosclerosis due to calcified coronary lesion: Secondary | ICD-10-CM | POA: Diagnosis present

## 2020-08-01 DIAGNOSIS — I872 Venous insufficiency (chronic) (peripheral): Secondary | ICD-10-CM | POA: Diagnosis present

## 2020-08-01 DIAGNOSIS — Z6841 Body Mass Index (BMI) 40.0 and over, adult: Secondary | ICD-10-CM

## 2020-08-01 DIAGNOSIS — Z8616 Personal history of COVID-19: Secondary | ICD-10-CM

## 2020-08-01 DIAGNOSIS — I5043 Acute on chronic combined systolic (congestive) and diastolic (congestive) heart failure: Secondary | ICD-10-CM

## 2020-08-01 LAB — CBC
HCT: 38.3 % — ABNORMAL LOW (ref 39.0–52.0)
Hemoglobin: 11.2 g/dL — ABNORMAL LOW (ref 13.0–17.0)
MCH: 27.7 pg (ref 26.0–34.0)
MCHC: 29.2 g/dL — ABNORMAL LOW (ref 30.0–36.0)
MCV: 94.6 fL (ref 80.0–100.0)
Platelets: 278 10*3/uL (ref 150–400)
RBC: 4.05 MIL/uL — ABNORMAL LOW (ref 4.22–5.81)
RDW: 17.2 % — ABNORMAL HIGH (ref 11.5–15.5)
WBC: 11.6 10*3/uL — ABNORMAL HIGH (ref 4.0–10.5)
nRBC: 0 % (ref 0.0–0.2)

## 2020-08-01 LAB — BASIC METABOLIC PANEL
Anion gap: 10 (ref 5–15)
BUN: 11 mg/dL (ref 8–23)
CO2: 26 mmol/L (ref 22–32)
Calcium: 9.5 mg/dL (ref 8.9–10.3)
Chloride: 100 mmol/L (ref 98–111)
Creatinine, Ser: 0.96 mg/dL (ref 0.61–1.24)
GFR, Estimated: >60 mL/min (ref >60)
Glucose, Bld: 146 mg/dL — ABNORMAL HIGH (ref 70–99)
Potassium: 4.3 mmol/L (ref 3.5–5.1)
Sodium: 136 mmol/L (ref 135–145)

## 2020-08-01 LAB — TROPONIN I (HIGH SENSITIVITY): Troponin I (High Sensitivity): 12 ng/L (ref <18)

## 2020-08-01 LAB — PROTIME-INR
INR: 1.5 — ABNORMAL HIGH (ref 0.8–1.2)
Prothrombin Time: 17.2 seconds — ABNORMAL HIGH (ref 11.4–15.2)

## 2020-08-01 LAB — RESP PANEL BY RT-PCR (FLU A&B, COVID) ARPGX2
Influenza A by PCR: NEGATIVE
Influenza B by PCR: NEGATIVE
SARS Coronavirus 2 by RT PCR: NEGATIVE

## 2020-08-01 LAB — BRAIN NATRIURETIC PEPTIDE: B Natriuretic Peptide: 432.5 pg/mL — ABNORMAL HIGH (ref 0.0–100.0)

## 2020-08-01 MED ORDER — METOPROLOL TARTRATE 50 MG PO TABS
50.0000 mg | ORAL_TABLET | Freq: Two times a day (BID) | ORAL | Status: DC
  Administered 2020-08-01 – 2020-08-02 (×2): 50 mg via ORAL
  Filled 2020-08-01 (×2): qty 1

## 2020-08-01 MED ORDER — ONDANSETRON HCL 4 MG/2ML IJ SOLN: 4.0000 mg | Freq: Four times a day (QID) | INTRAMUSCULAR | Status: DC | PRN

## 2020-08-01 MED ORDER — FUROSEMIDE 10 MG/ML IJ SOLN
80.0000 mg | Freq: Two times a day (BID) | INTRAMUSCULAR | Status: DC
  Administered 2020-08-01 – 2020-08-03 (×4): 80 mg via INTRAVENOUS
  Filled 2020-08-01 (×4): qty 8

## 2020-08-01 MED ORDER — NITROGLYCERIN 0.4 MG SL SUBL: 0.4000 mg | SUBLINGUAL_TABLET | SUBLINGUAL | Status: DC | PRN

## 2020-08-01 MED ORDER — FUROSEMIDE 10 MG/ML IJ SOLN
40.0000 mg | Freq: Once | INTRAMUSCULAR | Status: AC
  Administered 2020-08-01: 10:00:00 40 mg via INTRAVENOUS
  Filled 2020-08-01: qty 4

## 2020-08-01 MED ORDER — PANTOPRAZOLE SODIUM 40 MG PO TBEC
40.0000 mg | DELAYED_RELEASE_TABLET | Freq: Every day | ORAL | Status: DC
  Administered 2020-08-01 – 2020-08-03 (×3): 40 mg via ORAL
  Filled 2020-08-01 (×3): qty 1

## 2020-08-01 MED ORDER — ATORVASTATIN CALCIUM 40 MG PO TABS
40.0000 mg | ORAL_TABLET | Freq: Every day | ORAL | Status: DC
  Administered 2020-08-01 – 2020-08-03 (×3): 40 mg via ORAL
  Filled 2020-08-01 (×3): qty 1

## 2020-08-01 MED ORDER — AMIODARONE HCL IN DEXTROSE 360-4.14 MG/200ML-% IV SOLN
30.0000 mg/h | INTRAVENOUS | Status: DC
  Administered 2020-08-01 – 2020-08-03 (×3): 30 mg/h via INTRAVENOUS
  Filled 2020-08-01 (×3): qty 200

## 2020-08-01 MED ORDER — DILTIAZEM LOAD VIA INFUSION
10.0000 mg | Freq: Once | INTRAVENOUS | Status: DC
  Filled 2020-08-01: qty 10

## 2020-08-01 MED ORDER — APIXABAN 5 MG PO TABS
5.0000 mg | ORAL_TABLET | Freq: Two times a day (BID) | ORAL | Status: DC
  Administered 2020-08-01 – 2020-08-03 (×4): 5 mg via ORAL
  Filled 2020-08-01 (×4): qty 1

## 2020-08-01 MED ORDER — AMIODARONE LOAD VIA INFUSION
150.0000 mg | Freq: Once | INTRAVENOUS | Status: AC
  Administered 2020-08-01: 13:00:00 150 mg via INTRAVENOUS
  Filled 2020-08-01: qty 83.34

## 2020-08-01 MED ORDER — FERROUS SULFATE 325 (65 FE) MG PO TABS
325.0000 mg | ORAL_TABLET | Freq: Two times a day (BID) | ORAL | Status: DC
  Administered 2020-08-01 – 2020-08-03 (×4): 325 mg via ORAL
  Filled 2020-08-01 (×4): qty 1

## 2020-08-01 MED ORDER — PROPOFOL 10 MG/ML IV BOLUS
0.5000 mg/kg | Freq: Once | INTRAVENOUS | Status: AC
  Administered 2020-08-01: 09:00:00 65.8 mg via INTRAVENOUS
  Filled 2020-08-01: qty 20

## 2020-08-01 MED ORDER — DILTIAZEM HCL-DEXTROSE 125-5 MG/125ML-% IV SOLN (PREMIX): 5.0000 mg/h | INTRAVENOUS | Status: DC

## 2020-08-01 MED ORDER — VITAMIN B-12 1000 MCG PO TABS
1000.0000 ug | ORAL_TABLET | Freq: Every day | ORAL | Status: DC
  Administered 2020-08-01 – 2020-08-03 (×3): 1000 ug via ORAL
  Filled 2020-08-01 (×3): qty 1

## 2020-08-01 MED ORDER — AMIODARONE HCL IN DEXTROSE 360-4.14 MG/200ML-% IV SOLN
60.0000 mg/h | INTRAVENOUS | Status: DC
  Administered 2020-08-01: 13:00:00 60 mg/h via INTRAVENOUS
  Filled 2020-08-01 (×2): qty 200

## 2020-08-01 MED ORDER — ACETAMINOPHEN 325 MG PO TABS
650.0000 mg | ORAL_TABLET | ORAL | Status: DC | PRN
  Administered 2020-08-03: 650 mg via ORAL
  Filled 2020-08-01: qty 2

## 2020-08-01 NOTE — ED Notes (Signed)
Pt stated he was hot; removed gown. Pt appears flush in the face. RN notified. Temperature in 98.5

## 2020-08-01 NOTE — Sedation Documentation (Signed)
Patient up talking to wife, NRB removed and placed on 4L via [place] O2 sats 99%

## 2020-08-01 NOTE — Plan of Care (Signed)
  Problem: Education: Goal: Knowledge of General Education information will improve Description: Including pain rating scale, medication(s)/side effects and non-pharmacologic comfort measures 08/01/2020 2334 by Royetta Crochet, RN Outcome: Progressing 08/01/2020 2333 by Royetta Crochet, RN Outcome: Progressing   Problem: Health Behavior/Discharge Planning: Goal: Ability to manage health-related needs will improve 08/01/2020 2334 by Royetta Crochet, RN Outcome: Progressing 08/01/2020 2333 by Royetta Crochet, RN Outcome: Progressing   Problem: Clinical Measurements: Goal: Ability to maintain clinical measurements within normal limits will improve 08/01/2020 2334 by Royetta Crochet, RN Outcome: Progressing 08/01/2020 2333 by Royetta Crochet, RN Outcome: Progressing Goal: Will remain free from infection 08/01/2020 2334 by Royetta Crochet, RN Outcome: Progressing 08/01/2020 2333 by Royetta Crochet, RN Outcome: Progressing Goal: Diagnostic test results will improve 08/01/2020 2334 by Royetta Crochet, RN Outcome: Progressing 08/01/2020 2333 by Royetta Crochet, RN Outcome: Progressing Goal: Respiratory complications will improve 08/01/2020 2334 by Royetta Crochet, RN Outcome: Progressing 08/01/2020 2333 by Royetta Crochet, RN Outcome: Progressing Goal: Cardiovascular complication will be avoided 08/01/2020 2334 by Royetta Crochet, RN Outcome: Progressing 08/01/2020 2333 by Royetta Crochet, RN Outcome: Progressing   Problem: Activity: Goal: Risk for activity intolerance will decrease 08/01/2020 2334 by Royetta Crochet, RN Outcome: Progressing 08/01/2020 2333 by Royetta Crochet, RN Outcome: Progressing   Problem: Nutrition: Goal: Adequate nutrition will be maintained 08/01/2020 2334 by Royetta Crochet, RN Outcome: Progressing 08/01/2020 2333 by Royetta Crochet, RN Outcome: Progressing   Problem: Coping: Goal: Level of anxiety will decrease 08/01/2020  2334 by Royetta Crochet, RN Outcome: Progressing 08/01/2020 2333 by Royetta Crochet, RN Outcome: Progressing   Problem: Elimination: Goal: Will not experience complications related to bowel motility 08/01/2020 2334 by Royetta Crochet, RN Outcome: Progressing 08/01/2020 2333 by Royetta Crochet, RN Outcome: Progressing Goal: Will not experience complications related to urinary retention 08/01/2020 2334 by Royetta Crochet, RN Outcome: Progressing 08/01/2020 2333 by Royetta Crochet, RN Outcome: Progressing   Problem: Pain Managment: Goal: General experience of comfort will improve 08/01/2020 2334 by Royetta Crochet, RN Outcome: Progressing 08/01/2020 2333 by Royetta Crochet, RN Outcome: Progressing   Problem: Safety: Goal: Ability to remain free from injury will improve 08/01/2020 2334 by Royetta Crochet, RN Outcome: Progressing 08/01/2020 2333 by Royetta Crochet, RN Outcome: Progressing   Problem: Skin Integrity: Goal: Risk for impaired skin integrity will decrease 08/01/2020 2334 by Royetta Crochet, RN Outcome: Progressing 08/01/2020 2333 by Royetta Crochet, RN Outcome: Progressing   Problem: Education: Goal: Understanding of medication regimen will improve 08/01/2020 2334 by Royetta Crochet, RN Outcome: Progressing 08/01/2020 2333 by Royetta Crochet, RN Outcome: Progressing

## 2020-08-01 NOTE — ED Triage Notes (Addendum)
Pt arrives to ED with c/o shortness of breath. Pt states x2 days of worsening SOB. Now experiencing orthopnea and DOE. States pressure on chest, which feels like a 50 pound weight. Pt with hx of CHF. HR 140's and EKG showing Afib w/ RVR and 91% SpO2 on RA.

## 2020-08-01 NOTE — Sedation Documentation (Signed)
1 additional shock delivered at 200 J due to persistent A fib w/ RVR. Pt O2 sat dropping to 50s. RT at bedside bagging pt.

## 2020-08-01 NOTE — ED Provider Notes (Addendum)
[facility] EMERGENCY DEPARTMENT Provider Note   CSN: [id number] Arrival date & time: 08/01/20  [id number]     History Chief Complaint  Patient presents with  . Shortness of Breath    Shane Diaz is a 74 y.o. male.  Patient presents with sob for past 3-4 days. Symptoms gradual onset, moderate, constant, persistent, slowly worsening in past couple days. Hx CHF and AFIB, recent admission with CHF, anemia/GIB, and afib - was cardioverted that admission with improvement in symptoms. Is on eliquis, and indicates compliant w treatment/home meds. Denies fever/chills. No cough or uri symptoms. No chest pain. Indicates chronic bilateral leg edema, no acute worsening.   The history is provided by the patient.  Shortness of Breath Associated symptoms: no abdominal pain, no chest pain, no cough, no fever, no headaches, no neck pain, no rash, no sore throat and no vomiting        Past Medical History:  Diagnosis Date  . Arthritis   . Diverticulosis 05/2015   Mild, noted on colonosocpy  . Fatty liver 05/14/2015   noted on [place] ABD  . History of colon polyps 05/2014   sessile in ascending colon, pedunculated in sigmoid colon, diverticulosis  . Hyperlipidemia    pt denies  . Hypertension    not currently taking medications per MD  . 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 Umbilical hernia   . Varicose veins     Patient Active Problem List   Diagnosis Date Noted  . Iron deficiency anemia   . Occult blood in stools   . A-fib ([place]) 07/07/2020  . Pneumonia due to COVID-19 virus 05/08/2020  . Sepsis ([place]) 05/08/2020  . Atrial fibrillation with rapid ventricular response ([place]) 05/08/2020  . Elevated troponin 05/08/2020  . Essential hypertension 05/08/2020  . Varicose veins of leg with complications [id number]  . Varicose veins of left lower extremity with complications [id number]  . Varicose veins of bilateral lower extremities with other complications  [id number]  . Special screening for malignant neoplasms, colon 04/11/2014    Past Surgical History:  Procedure Laterality Date  . APPENDECTOMY     childhood  . BIOPSY  07/11/2020   Procedure: BIOPSY;  Surgeon: Ladene Artist, MD;  Location: [place];  Service: Endoscopy;;  . CARDIOVERSION N/A 07/13/2020   Procedure: CARDIOVERSION;  Surgeon: Josue Hector, MD;  Location: [place];  Service: Cardiovascular;  Laterality: N/A;  . COLONOSCOPY W/ POLYPECTOMY  05/23/2014  . COLONOSCOPY WITH PROPOFOL N/A 07/11/2020   Procedure: COLONOSCOPY WITH PROPOFOL;  Surgeon: Ladene Artist, MD;  Location: [facility] ENDOSCOPY;  Service: Endoscopy;  Laterality: N/A;  . ESOPHAGOGASTRODUODENOSCOPY (EGD) WITH PROPOFOL N/A 07/11/2020   Procedure: ESOPHAGOGASTRODUODENOSCOPY (EGD) WITH PROPOFOL;  Surgeon: Ladene Artist, MD;  Location: [facility] ENDOSCOPY;  Service: Endoscopy;  Laterality: N/A;  . TEE WITHOUT CARDIOVERSION N/A 07/13/2020   Procedure: TRANSESOPHAGEAL ECHOCARDIOGRAM (TEE);  Surgeon: Josue Hector, MD;  Location: [facility] ENDOSCOPY;  Service: Cardiovascular;  Laterality: N/A;  . UMBILICAL HERNIA REPAIR N/A 04/27/2018   Procedure: [place];  Surgeon: Alphonsa Overall, MD;  Location: [facility];  Service: General;  Laterality: N/A;  . varicose veins    . WRIST SURGERY         Family History  Problem Relation Age of Onset  . Colon cancer Neg Hx   . Esophageal cancer Neg Hx   . Pancreatic cancer Neg Hx   . Prostate cancer Neg Hx   .  Rectal cancer Neg Hx   . Stomach cancer Neg Hx     Social History   Tobacco Use  . Smoking status: Never Smoker  . Smokeless tobacco: Never Used  Vaping Use  . Vaping Use: Never used  Substance Use Topics  . Alcohol use: Yes    Alcohol/week: 0.0 standard drinks    Comment: occassional wine, martini  . Drug use: No    Home Medications Prior to Admission medications   Medication Sig Start Date End Date Taking? Authorizing Provider   apixaban (ELIQUIS) 5 MG TABS tablet Take 1 tablet (5 mg total) by mouth 2 (two) times daily. 06/01/19  Yes [name], [name], MD  atorvastatin (LIPITOR) 40 MG tablet Take 1 tablet (40 mg total) by mouth daily. 07/14/20 07/14/21  George Hugh, MD  ferrous sulfate 325 (65 FE) MG tablet Take 1 tablet (325 mg total) by mouth 2 (two) times daily with a meal. 07/14/20 08/13/20  George Hugh, MD  furosemide (LASIX) 40 MG tablet Take 1 tablet (40 mg total) by mouth daily. 07/14/20 07/14/21  George Hugh, MD  gatifloxacin (ZYMAXID) 0.5 % SOLN Place 1 drop into the left eye 4 (four) times daily. 04/03/20   [provider]  metoprolol tartrate (LOPRESSOR) 50 MG tablet Take 1 tablet (50 mg total) by mouth 2 (two) times daily. 07/14/20 08/13/20  George Hugh, MD  pantoprazole (PROTONIX) 40 MG tablet Take 1 tablet (40 mg total) by mouth daily. 05/10/20   Elgergawy, Silver Huguenin, MD  vitamin B-12 1000 MCG tablet Take 1 tablet (1,000 mcg total) by mouth daily. 07/15/20 08/14/20  George Hugh, MD    Allergies    Patient has no known allergies.  Review of Systems   Review of Systems  Constitutional: Negative for chills and fever.  HENT: Negative for sore throat.   Eyes: Negative for redness.  Respiratory: Positive for shortness of breath. Negative for cough.   Cardiovascular: Positive for palpitations. Negative for chest pain.  Gastrointestinal: Negative for abdominal pain, diarrhea and vomiting.  Genitourinary: Negative for dysuria and flank pain.  Musculoskeletal: Negative for back pain and neck pain.  Skin: Negative for rash.  Neurological: Negative for headaches.  Hematological:       On eliquis, denies abnormal bruising or bleeding.   Psychiatric/Behavioral: Negative for confusion.    Physical Exam Updated Vital Signs BP 125/88   Pulse (!) 102   Temp 98.5 F (36.9 C) (Oral)   Resp (!) 36   Ht 1.676 m (5\' 6" )   Wt 131.5 kg   SpO2 96%   BMI 46.81 kg/m   Physical Exam Vitals and  nursing note reviewed.  Constitutional:      Appearance: Normal appearance. He is well-developed.  HENT:     Head: Atraumatic.     Nose: Nose normal.     Mouth/Throat:     Mouth: Mucous membranes are moist.     Pharynx: Oropharynx is clear.  Eyes:     General: No scleral icterus.    Conjunctiva/sclera: Conjunctivae normal.  Neck:     Trachea: No tracheal deviation.  Cardiovascular:     Rate and Rhythm: Tachycardia present. Rhythm irregular.     Pulses: Normal pulses.     Heart sounds: Normal heart sounds. No murmur heard. No friction rub. No gallop.   Pulmonary:     Effort: Pulmonary effort is normal. No accessory muscle usage or respiratory distress.     Breath sounds: Normal breath sounds.  Abdominal:  General: Bowel sounds are normal. There is no distension.     Palpations: Abdomen is soft.     Tenderness: There is no abdominal tenderness.  Genitourinary:    Comments: No cva tenderness. Musculoskeletal:     Cervical back: Normal range of motion and neck supple. No rigidity.     Comments: +bilateral lower leg edema, symmetric.   Skin:    General: Skin is warm and dry.     Findings: No rash.  Neurological:     Mental Status: He is alert.     Comments: Alert, speech clear. Motor/sens grossly intact bil.   Psychiatric:        Mood and Affect: Mood normal.      ED Results / Procedures / Treatments   Labs (all labs ordered are listed, but only abnormal results are displayed) Results for orders placed or performed during the hospital encounter of 08/01/20  Basic metabolic panel  Result Value Ref Range   Sodium 136 135 - 145 mmol/L   Potassium 4.3 3.5 - 5.1 mmol/L   Chloride 100 98 - 111 mmol/L   CO2 26 22 - 32 mmol/L   Glucose, Bld 146 (H) 70 - 99 mg/dL   BUN 11 8 - 23 mg/dL   Creatinine, Ser 8.41 0.61 - 1.24 mg/dL   Calcium 9.5 8.9 - 66.0 mg/dL   GFR, Estimated >63 >01 mL/min   Anion gap 10 5 - 15  CBC  Result Value Ref Range   WBC 11.6 (H) 4.0 - 10.5 K/uL    RBC 4.05 (L) 4.22 - 5.81 MIL/uL   Hemoglobin 11.2 (L) 13.0 - 17.0 g/dL   HCT 60.1 (L) 09.3 - 23.5 %   MCV 94.6 80.0 - 100.0 fL   MCH 27.7 26.0 - 34.0 pg   MCHC 29.2 (L) 30.0 - 36.0 g/dL   RDW 57.3 (H) 22.0 - 25.4 %   Platelets 278 150 - 400 K/uL   nRBC 0.0 0.0 - 0.2 %  Brain natriuretic peptide  Result Value Ref Range   B Natriuretic Peptide 432.5 (H) 0.0 - 100.0 pg/mL  Troponin I (High Sensitivity)  Result Value Ref Range   Troponin I (High Sensitivity) 12 <18 ng/L   DG Chest 2 View  Result Date: 08/01/2020 CLINICAL DATA:  Shortness of breath and dizziness for 2 days. EXAM: CHEST - 2 VIEW COMPARISON:  Chest x-ray dated 07/07/2020 and chest CT dated 07/14/2020. FINDINGS: Stable cardiomegaly. Persistent ill-defined opacities at the LEFT lung base, atelectasis versus pneumonia, not significantly changed in size/extent compared to the recent chest CT. Small bilateral pleural effusions. Persistent subtle streaky airspace opacities within the RIGHT upper lobe, corresponding to the atelectasis versus pneumonia described on recent chest CT, stable. No new lung findings. No pneumothorax is seen. No acute appearing osseous abnormality. IMPRESSION: 1. Persistent ill-defined opacities at the LEFT lung base, compatible with atelectasis versus pneumonia, not significantly changed in size/extent compared to the recent chest CT of 07/14/2020. 2. Small bilateral pleural effusions. 3. Stable atelectasis versus pneumonia within the RIGHT upper lobe. 4. Stable cardiomegaly. Electronically Signed   By: Bary Richard M.D.   On: 08/01/2020 08:12   CT ANGIO CHEST PE W OR WO CONTRAST  Result Date: 07/14/2020 CLINICAL DATA:  Chest pain, shortness of breath. EXAM: CT ANGIOGRAPHY CHEST WITH CONTRAST TECHNIQUE: Multidetector CT imaging of the chest was performed using the standard protocol during bolus administration of intravenous contrast. Multiplanar CT image reconstructions and MIPs were obtained to evaluate the  vascular anatomy. CONTRAST:  23mL OMNIPAQUE IOHEXOL 350 MG/ML SOLN COMPARISON:  None. FINDINGS: Cardiovascular: Satisfactory opacification of the pulmonary arteries to the segmental level. No evidence of pulmonary embolism. Mild cardiomegaly is noted. Minimal pericardial effusion is noted. Mild coronary artery calcifications are noted. Atherosclerosis of thoracic aorta is noted without aneurysm formation. Mediastinum/Nodes: No enlarged mediastinal, hilar, or axillary lymph nodes. Thyroid gland, trachea, and esophagus demonstrate no significant findings. Lungs/Pleura: Minimal bilateral pleural effusions are noted with adjacent subsegmental atelectasis. No pneumothorax is noted. Mild right upper lobe atelectasis or infiltrate is noted. Probable subsegmental atelectasis is seen in the lingular aspect of the left upper lobe. Upper Abdomen: No acute abnormality. Musculoskeletal: No chest wall abnormality. No acute or significant osseous findings. Review of the MIP images confirms the above findings. IMPRESSION: 1. No definite evidence of pulmonary embolus. 2. Minimal bilateral pleural effusions are noted with adjacent subsegmental atelectasis. 3. Mild right upper lobe atelectasis or infiltrate is noted. 4. Probable subsegmental atelectasis is seen in the lingular aspect of the left upper lobe. 5. Mild coronary artery calcifications are noted suggesting coronary artery disease. 6. Aortic atherosclerosis. Aortic Atherosclerosis (ICD10-I70.0). Electronically Signed   By: Marijo Conception M.D.   On: 07/14/2020 09:27   DG Chest Port 1 View  Result Date: 07/07/2020 CLINICAL DATA:  Questionable sepsis.  Evaluate for abnormality. EXAM: PORTABLE CHEST 1 VIEW COMPARISON:  May 07, 2020 FINDINGS: Stable cardiomegaly. Mild atelectasis in the left base. No pneumothorax. The lungs are clear. No other abnormalities. IMPRESSION: No active disease. Electronically Signed   By: Dorise Bullion III M.D   On: 07/07/2020 15:53    ECHOCARDIOGRAM COMPLETE  Result Date: 07/11/2020    ECHOCARDIOGRAM REPORT   Patient Name:   Shane Diaz Date of Exam: 07/11/2020 Medical Rec #:  [id number]     Height:       67.0 in Accession #:    [id number]    Weight:       289.9 lb Date of Birth:  [birth year]     BSA:          2.368 m Patient Age:    29 years      BP:           114/80 mmHg Patient Gender: M             HR:           125 bpm. Exam Location:  Inpatient Procedure: 2D Echo and Intracardiac Opacification Agent Indications:    CHF- Acute Diastolic [id number]  History:        Patient has prior history of Echocardiogram examinations, most                 recent 05/08/2020. Risk Factors:Hypertension and Dyslipidemia.  Sonographer:    Mikki Santee RDCS (AE) Referring Phys: [id number] Lequita Halt IMPRESSIONS  1. Left ventricular ejection fraction, by estimation, is 50 to 55%. The left ventricle has low normal function. The left ventricle has no regional wall motion abnormalities. There is moderate left ventricular hypertrophy. Left ventricular diastolic parameters are indeterminate.  2. Right ventricular systolic function is mildly reduced. The right ventricular size is mildly enlarged. There is mildly elevated pulmonary artery systolic pressure. The estimated right ventricular systolic pressure is [id number] mmHg.  3. Right atrial size was mildly dilated.  4. The mitral valve is normal in structure. Trivial mitral valve regurgitation.  5. The aortic valve was not well visualized. Aortic valve regurgitation is not visualized. No aortic stenosis  is present.  6. Aortic dilatation noted. There is mild dilatation of the ascending aorta, measuring 38 mm.  7. The inferior vena cava is dilated in size with <50% respiratory variability, suggesting right atrial pressure of 15 mmHg. FINDINGS  Left Ventricle: Left ventricular ejection fraction, by estimation, is 50 to 55%. The left ventricle has low normal function. The left ventricle has no regional wall motion  abnormalities. The left ventricular internal cavity size was normal in size. There is moderate left ventricular hypertrophy. Left ventricular diastolic parameters are indeterminate. Right Ventricle: The right ventricular size is mildly enlarged. Right vetricular wall thickness was not assessed. Right ventricular systolic function is mildly reduced. There is mildly elevated pulmonary artery systolic pressure. The tricuspid regurgitant velocity is 2.39 m/s, and with an assumed right atrial pressure of 15 mmHg, the estimated right ventricular systolic pressure is [id number] mmHg. Left Atrium: Left atrial size was normal in size. Right Atrium: Right atrial size was mildly dilated. Pericardium: Trivial pericardial effusion is present. Presence of pericardial fat pad. Mitral Valve: The mitral valve is normal in structure. Trivial mitral valve regurgitation. Tricuspid Valve: The tricuspid valve is normal in structure. Tricuspid valve regurgitation is trivial. Aortic Valve: The aortic valve was not well visualized. Aortic valve regurgitation is not visualized. No aortic stenosis is present. Pulmonic Valve: The pulmonic valve was not well visualized. Pulmonic valve regurgitation is not visualized. Aorta: The aortic root is normal in size and structure and aortic dilatation noted. There is mild dilatation of the ascending aorta, measuring 38 mm. Venous: The inferior vena cava is dilated in size with less than 50% respiratory variability, suggesting right atrial pressure of 15 mmHg. IAS/Shunts: The interatrial septum was not well visualized.  LEFT VENTRICLE PLAX 2D LVIDd:         4.00 cm LVIDs:         2.80 cm LV PW:         1.40 cm LV IVS:        1.40 cm LVOT diam:     2.40 cm LV SV:         59 LV SV Index:   25 LVOT Area:     4.52 cm  RIGHT VENTRICLE TAPSE (M-mode): 1.3 cm LEFT ATRIUM             Index       RIGHT ATRIUM           Index LA diam:        4.70 cm 1.98 cm/m  RA Area:     24.90 cm LA Vol (A2C):   97.1 ml 41.01 ml/m  RA Volume:   81.10 ml  34.25 ml/m LA Vol (A4C):   49.8 ml 21.03 ml/m LA Biplane Vol: 71.3 ml 30.11 ml/m  AORTIC VALVE LVOT Vmax:   71.80 cm/s LVOT Vmean:  47.800 cm/s LVOT VTI:    0.130 m  AORTA Ao Root diam: 3.50 cm Ao Asc diam:  3.80 cm TRICUSPID VALVE TR Peak grad:   22.8 mmHg TR Vmax:        239.00 cm/s  SHUNTS Systemic VTI:  0.13 m Systemic Diam: 2.40 cm Oswaldo Milian MD Electronically signed by Oswaldo Milian MD Signature Date/Time: 07/11/2020/4:44:06 PM    Final    ECHO TEE  Result Date: 07/13/2020    TRANSESOPHOGEAL ECHO REPORT   Patient Name:   Shane Diaz Date of Exam: 07/13/2020 Medical Rec #:  [id number]     Height:  67.0 in Accession #:    [id number]    Weight:       283.0 lb Date of Birth:  1945-08-14     BSA:          2.344 m Patient Age:    76 years      BP:           110/83 mmHg Patient Gender: M             HR:           135 bpm. Exam Location:  Inpatient Procedure: Transesophageal Echo, Color Doppler and Cardiac Doppler Indications:     I48.91* Unspecified atrial fibrillation  History:         Patient has prior history of Echocardiogram examinations, most                  recent 07/11/2020. Arrythmias:Atrial Fibrillation; Risk                  Factors:Hypertension, Dyslipidemia and COVID+ 05/2020.  Sonographer:     Raquel Sarna Senior RDCS Referring Phys:  [place] Diagnosing Phys: Jenkins Rouge MD PROCEDURE: After discussion of the risks and benefits of a TEE, an informed consent was obtained from the patient. The transesophogeal probe was passed without difficulty through the esophogus of the patient. Sedation performed by different physician. The patient was monitored while under deep sedation. Anesthestetic sedation was provided intravenously by Anesthesiology: 472mg  of Propofol. The patient developed no complications during the procedure. A successful direct current cardioversion was performed at 200 joules with 1 attempt. IMPRESSIONS  1. TEE with no RAA/LAA  thrombus DCC x 1 with 200J converted from atrial flutter rate 133 bpm to NSR rate 88 bpm On eliquis with no immediate neurologic sequelae.  2. EF decreased with patient in rapid atrial flutter rate 133. Left ventricular ejection fraction, by estimation, is 45 to 50%. The left ventricle has mildly decreased function. There is mild left ventricular hypertrophy.  3. Right ventricular systolic function is severely reduced. The right ventricular size is severely enlarged.  4. Left atrial size was moderately dilated. No left atrial/left atrial appendage thrombus was detected.  5. Right atrial size was severely dilated.  6. The mitral valve is normal in structure. Mild mitral valve regurgitation.  7. Tricuspid valve regurgitation is moderate.  8. The aortic valve is tricuspid. Aortic valve regurgitation is mild. Mild aortic valve sclerosis is present, with no evidence of aortic valve stenosis. FINDINGS  Left Ventricle: EF decreased with patient in rapid atrial flutter rate 133. Left ventricular ejection fraction, by estimation, is 45 to 50%. The left ventricle has mildly decreased function. The left ventricular internal cavity size was normal in size. There is mild left ventricular hypertrophy. Right Ventricle: The right ventricular size is severely enlarged. Right vetricular wall thickness was not assessed. Right ventricular systolic function is severely reduced. Left Atrium: Left atrial size was moderately dilated. No left atrial/left atrial appendage thrombus was detected. Right Atrium: Right atrial size was severely dilated. Pericardium: There is no evidence of pericardial effusion. Mitral Valve: The mitral valve is normal in structure. Mild mitral valve regurgitation. Tricuspid Valve: The tricuspid valve is normal in structure. Tricuspid valve regurgitation is moderate. Aortic Valve: The aortic valve is tricuspid. Aortic valve regurgitation is mild. Mild aortic valve sclerosis is present, with no evidence of aortic  valve stenosis. Pulmonic Valve: The pulmonic valve was normal in structure. Pulmonic valve regurgitation is mild. Aorta: The aortic root  is normal in size and structure. IAS/Shunts: No atrial level shunt detected by color flow Doppler. Additional Comments: TEE with no RAA/LAA thrombus DCC x 1 with 200J converted from atrial flutter rate 133 bpm to NSR rate 88 bpm On eliquis with no immediate neurologic sequelae. Jenkins Rouge MD Electronically signed by Jenkins Rouge MD Signature Date/Time: 07/13/2020/12:39:55 PM    Final    US Abdomen Limited RUQ (LIVER/GB)  Result Date: 07/08/2020 CLINICAL DATA:  Fatty liver.  Morbid obesity. EXAM: ULTRASOUND ABDOMEN LIMITED RIGHT UPPER QUADRANT COMPARISON:  05/14/2015 FINDINGS: Gallbladder: Tiny gallstones evident measuring up to 10 mm. Gallbladder is incompletely distended with wall thickness measuring 7-8 mm. Common bile duct: Diameter: 4-5 mm Liver: Echogenic liver parenchyma suggests fatty deposition. Portal vein is patent on color Doppler imaging with normal direction of blood flow towards the liver. Other: None. IMPRESSION: 1. Cholelithiasis with gallbladder wall thickening. Gallbladder is not fully distended which could accentuate wall thickness. If clinical picture is equivocal for acute cholecystitis, nuclear scintigraphy may prove helpful to further evaluate. Electronically Signed   By: Misty Stanley M.D.   On: 07/08/2020 09:50    ED ECG REPORT   Date: 08/01/2020  Rate: 126  Rhythm: atrial fibrillation  QRS Axis: normal  Intervals: normal  ST/T Wave abnormalities: nonspecific ST/T changes  Conduction Disutrbances:right bundle branch block  Narrative Interpretation:   Old EKG Reviewed: changes noted  I have personally reviewed the EKG tracing     Radiology DG Chest 2 View  Result Date: 08/01/2020 CLINICAL DATA:  Shortness of breath and dizziness for 2 days. EXAM: CHEST - 2 VIEW COMPARISON:  Chest x-ray dated 07/07/2020 and chest CT dated  07/14/2020. FINDINGS: Stable cardiomegaly. Persistent ill-defined opacities at the LEFT lung base, atelectasis versus pneumonia, not significantly changed in size/extent compared to the recent chest CT. Small bilateral pleural effusions. Persistent subtle streaky airspace opacities within the RIGHT upper lobe, corresponding to the atelectasis versus pneumonia described on recent chest CT, stable. No new lung findings. No pneumothorax is seen. No acute appearing osseous abnormality. IMPRESSION: 1. Persistent ill-defined opacities at the LEFT lung base, compatible with atelectasis versus pneumonia, not significantly changed in size/extent compared to the recent chest CT of 07/14/2020. 2. Small bilateral pleural effusions. 3. Stable atelectasis versus pneumonia within the RIGHT upper lobe. 4. Stable cardiomegaly. Electronically Signed   By: Franki Cabot M.D.   On: 08/01/2020 08:12    Procedures .Sedation  Date/Time: 08/01/2020 10:04 AM Performed by: Lajean Saver, MD Authorized by: Lajean Saver, MD   Consent:    Consent obtained:  Verbal and written   Consent given by:  Patient   Risks discussed:  Allergic reaction, dysrhythmia, inadequate sedation, nausea, prolonged hypoxia resulting in organ damage, prolonged sedation necessitating reversal, respiratory compromise necessitating ventilatory assistance and intubation and vomiting   Alternatives discussed:  Analgesia without sedation and anxiolysis Universal protocol:    Procedure explained and questions answered to patient or proxy's satisfaction: yes     Relevant documents present and verified: yes     Test results available: yes     Imaging studies available: yes     Required blood products, implants, devices, and special equipment available: yes     Immediately prior to procedure, a time out was called: yes     Patient identity confirmed:  Verbally with patient, arm band and provided demographic data Indications:    Procedure performed:   Cardioversion   Procedure necessitating sedation performed by:  Physician performing sedation Pre-sedation  assessment:    Time since last food or drink:  12 hours   ASA classification: class 3 - patient with severe systemic disease     Mouth opening:  3 or more finger widths   Mallampati score:  III - soft palate, base of uvula visible   Neck mobility: reduced     Pre-sedation assessments completed and reviewed: airway patency, cardiovascular function, hydration status, mental status, nausea/vomiting, pain level, respiratory function and temperature     Pre-sedation assessment completed:  08/01/2020 9:00 AM Immediate pre-procedure details:    Reassessment: Patient reassessed immediately prior to procedure     Reviewed: vital signs, relevant labs/tests and NPO status     Verified: bag valve mask available, emergency equipment available, intubation equipment available, IV patency confirmed, oxygen available and suction available   Procedure details (see MAR for exact dosages):    Preoxygenation:  Nasal cannula   Sedation:  Propofol   Intended level of sedation: deep   Intra-procedure monitoring:  Blood pressure monitoring, cardiac monitor, continuous pulse oximetry, frequent LOC assessments, frequent vital sign checks and continuous capnometry   Intra-procedure events: respiratory depression     Intra-procedure management:  Airway repositioning, supplemental oxygen and BVM ventilation   Total Provider sedation time (minutes):  25 Post-procedure details:    Post-sedation assessment completed:  08/01/2020 10:07 AM   Attendance: Constant attendance by certified staff until patient recovered     Recovery: Patient returned to pre-procedure baseline     Post-sedation assessments completed and reviewed: airway patency, cardiovascular function, hydration status, mental status, nausea/vomiting, pain level, respiratory function and temperature     Patient is stable for discharge or admission: yes      Procedure completion:  Tolerated well, no immediate complications Comments:     Patient very sensitive to .5 mg/kg dose of propofol, with resp depression, requiring bag assistance.  .Cardioversion  Date/Time: 08/01/2020 10:08 AM Performed by: Lajean Saver, MD Authorized by: Lajean Saver, MD   Consent:    Consent obtained:  Verbal and written   Consent given by:  Patient   Alternatives discussed:  No treatment, rate-control medication and observation Pre-procedure details:    Cardioversion basis:  Emergent   Rhythm:  Atrial fibrillation Patient sedated: Yes. Refer to sedation procedure documentation for details of sedation.  Attempt one:    Cardioversion mode:  Synchronous   Shock (Joules):  200   Shock outcome:  Conversion to normal sinus rhythm (transient conversion to sinus rhythm, with reversion to afib after ~ 20-30 seconds) Attempt two:    Shock (Joules):  200   Shock outcome:  Conversion to normal sinus rhythm (transiently in sinus, reversion to afib) Post-procedure details:    Patient status:  Awake Comments:     Pt returned to baseline mental status on reassessment     (including critical care time)  Medications Ordered in ED Medications  propofol (DIPRIVAN) 10 mg/mL bolus/IV push 65.8 mg (has no administration in time range)    ED Course  I have reviewed the triage vital signs and the nursing notes.  Pertinent labs & imaging results that were available during my care of the patient were reviewed by me and considered in my medical decision making (see chart for details).    MDM Rules/Calculators/A&P                          Iv ns. Continuous pulse ox and cardiac monitoring - afib w rvr.  Stat  labs and imaging. Ecg.  CHADSVASC score = 4.  Reviewed nursing notes and prior charts for additional history. Recent prior admission for same reviewed.   Labs reviewed/interpreted by me - hgb 11, similar to prior.   CXR reviewed/interpreted by me - small bil  effusion, ?infiltrate similar to prior. Pt denies increased cough or fever.   Discussed ED cardioversion w patient, patient agreeable to proceed.   MDM Number of Diagnoses or Management Options   Amount and/or Complexity of Data Reviewed Clinical lab tests: ordered and reviewed Tests in the radiology section of CPT: ordered and reviewed Tests in the medicine section of CPT: ordered and reviewed Discussion of test results with the performing providers: yes Decide to obtain previous medical records or to obtain history from someone other than the patient: yes Obtain history from someone other than the patient: yes Review and summarize past medical records: yes Discuss the patient with other providers: yes Independent visualization of images, tracings, or specimens: yes  Risk of Complications, Morbidity, and/or Mortality Presenting problems: high Diagnostic procedures: high Management options: high  Procedural sedation with propofol, cardioversion. After conversion to sinus rhythm for ~20-30 seconds, pt reverted to afib/rvr.  During recent admission w same, cardizem, amio, bblocker, failed to control rate well, and pt had eventually TEE cardioversion. Cardiology consulted.  CRITICAL CARE RE: afib with rvr, with CHF. Propofol/procedural sedation with synch cardioversion Performed by: Mirna Mires Total critical care time: 75 minutes Critical care time was exclusive of separately billable procedures and treating other patients. Critical care was necessary to treat or prevent imminent or life-threatening deterioration. Critical care was time spent personally by me on the following activities: development of treatment plan with patient and/or surrogate as well as nursing, discussions with consultants, evaluation of patient's response to treatment, examination of patient, obtaining history from patient or surrogate, ordering and performing treatments and interventions, ordering and review  of laboratory studies, ordering and review of radiographic studies, pulse oximetry and re-evaluation of patient's condition.   Discussed w cardiology - will see in ED/admit.        Final Clinical Impression(s) / ED Diagnoses Final diagnoses:  None    Rx / DC Orders ED Discharge Orders    None         Lajean Saver, MD 08/01/20 [phone]

## 2020-08-01 NOTE — H&P (Addendum)
Cardiology Admission History and Physical:   Patient ID: Tonatiuh Mendicino MRN: [id number]; DOB: 09/22/1945   Admission date: 08/01/2020  Primary Care Provider: [name], MD [facility] Cardiologist: Sanda Klein, MD  Chief Complaint:  Chest pressure, sob, palpitations   Patient Profile:   Argel Cartee is a 74 y.o. 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   Last seen by Dr. Sallyanne Kuster 08/2019.  Admitted 10/2021for Covid pneumonia and was found to have A. fib with RVR, treated with diltiazem.  Echo on 05/08/20 showed normal LVEF, grade 2 diastolic dysfunction, mild RV dysfunction, no significant valvular disease.  He was already on Eliquis for bilateral DVTs.    History of Present Illness:   Mr. Suntken most recently admitted 12/4-12/11. He presented with acute onset shortness of breath and found to  Have 2:1 flutter. HR remains elevated despite amio, metoprolol and diltiazem. He had abrupt fall in hemoglobin. Stool guaiac was positive. GI did not find any source of bleeding with colonoscopy or EGD. He underwent successful TEE/DCCV on 07/13/2020. He was discharged on Eliquis and Lopressor. He is treated with lasix for acute dCHF and antibotics for lower extremity cellulitis bilaterally.   He was doing well since discharge however presented to ER with 2 days hx of progressive worsening SOB, orthopnea, palpitations, chest pressure, dizziness and severe fatigue. Denies bleeding. He was noted in atrial fibrillation with RVR. He was compliant with his anticoagulation. He underwent cardioversion x 2. Both time had brief conversation to sinus rhythm and revert back to atrial fibrillation within few seconds. His Hr in 150-160s currently. His symptoms similar to last  admission. Denies fever and chills. Mild intermittent cough. He has received [employer] x2 vaccine, last dose in  April. Hasn't got his booster.  HGb 11.2 Hs-troponin normal BNP 432.5  Chest X-ray IMPRESSION: 1. Persistent ill-defined opacities at the LEFT lung base, compatible with atelectasis versus pneumonia, not significantly changed in size/extent compared to the recent chest CT of 07/14/2020. 2. Small bilateral pleural effusions. 3. Stable atelectasis versus pneumonia within the RIGHT upper lobe. 4. Stable cardiomegaly.  Past Medical History:  Diagnosis Date   Arthritis    Diverticulosis 05/2015   Mild, noted on colonosocpy   Fatty liver 05/14/2015   noted on [place] ABD   History of colon polyps 05/2014   sessile in ascending colon, pedunculated in sigmoid colon, diverticulosis   Hyperlipidemia    pt denies   Hypertension    not currently taking medications per MD   Renal cyst 05/2014   Small, Left, noted on [place] ABD   Umbilical hernia    Varicose veins     Past Surgical History:  Procedure Laterality Date   APPENDECTOMY     childhood   BIOPSY  07/11/2020   Procedure: BIOPSY;  Surgeon: Ladene Artist, MD;  Location: [place];  Service: Endoscopy;;   CARDIOVERSION N/A 07/13/2020   Procedure: CARDIOVERSION;  Surgeon: Josue Hector, MD;  Location: [place];  Service: Cardiovascular;  Laterality: N/A;   COLONOSCOPY W/ POLYPECTOMY  05/23/2014   COLONOSCOPY WITH PROPOFOL N/A 07/11/2020   Procedure: COLONOSCOPY WITH PROPOFOL;  Surgeon: Ladene Artist, MD;  Location: [place];  Service: Endoscopy;  Laterality: N/A;   ESOPHAGOGASTRODUODENOSCOPY (EGD) WITH PROPOFOL N/A 07/11/2020   Procedure: ESOPHAGOGASTRODUODENOSCOPY (EGD) WITH PROPOFOL;  Surgeon:  Ladene Artist, MD;  [facility] ENDOSCOPY;  Service: Endoscopy;  Laterality: N/A;   TEE WITHOUT CARDIOVERSION N/A 07/13/2020   Procedure: TRANSESOPHAGEAL ECHOCARDIOGRAM (TEE);  Surgeon: Josue Hector,  MD;  Location: [facility] ENDOSCOPY;  Service: Cardiovascular;  Laterality: N/A;   UMBILICAL HERNIA REPAIR N/A 04/27/2018   Procedure: [facility] [place];  Surgeon: Alphonsa Overall, MD;  Location: [facility];  Service: General;  Laterality: N/A;   varicose veins     WRIST SURGERY       Medications Prior to Admission: Prior to Admission medications   Medication Sig Start Date End Date Taking? Authorizing Provider  apixaban (ELIQUIS) 5 MG TABS tablet Take 1 tablet (5 mg total) by mouth 2 (two) times daily. 06/01/19  Yes [name], [name], MD  atorvastatin (LIPITOR) 40 MG tablet Take 1 tablet (40 mg total) by mouth daily. 07/14/20 07/14/21 Yes Masoud, Jarrett Soho, MD  ferrous sulfate 325 (65 FE) MG tablet Take 1 tablet (325 mg total) by mouth 2 (two) times daily with a meal. 07/14/20 08/13/20 Yes Masoud, Jarrett Soho, MD  furosemide (LASIX) 40 MG tablet Take 1 tablet (40 mg total) by mouth daily. 07/14/20 07/14/21 Yes Masoud, Jarrett Soho, MD  metoprolol tartrate (LOPRESSOR) 50 MG tablet Take 1 tablet (50 mg total) by mouth 2 (two) times daily. 07/14/20 08/13/20 Yes Masoud, Jarrett Soho, MD  pantoprazole (PROTONIX) 40 MG tablet Take 1 tablet (40 mg total) by mouth daily. 05/10/20  Yes Elgergawy, Silver Huguenin, MD  triamcinolone (KENALOG) 0.1 % Apply 1 application topically at bedtime. For legs   Yes [provider]  vitamin B-12 1000 MCG tablet Take 1 tablet (1,000 mcg total) by mouth daily. 07/15/20 08/14/20 Yes George Hugh, MD     Allergies:   No Known Allergies  Social History:   Social History   Socioeconomic History   Marital status: Married    Spouse name: Not on file   Number of children: 0   Years of education: Not on file   Highest education level: Not on file  Occupational History   Not on file  Tobacco Use   Smoking status: Never Smoker   Smokeless tobacco: Never Used  Vaping Use   Vaping Use: Never used  Substance and Sexual Activity   Alcohol use: Yes    Alcohol/week: 0.0  standard drinks    Comment: occassional wine, martini   Drug use: No   Sexual activity: Not on file  Other Topics Concern   Not on file  Social History Narrative   Not on file   Social Determinants of Health   Financial Resource Strain: Not on file  Food Insecurity: Not on file  Transportation Needs: Not on file  Physical Activity: Not on file  Stress: Not on file  Social Connections: Not on file  Intimate Partner Violence: Not on file    Family History:  The patient's family history is negative for Colon cancer, Esophageal cancer, Pancreatic cancer, Prostate cancer, Rectal cancer, and Stomach cancer.    ROS:  Please see the history of present illness.  All other ROS reviewed and negative.     Physical Exam/Data:   Vitals:   08/01/20 1015 08/01/20 1045 08/01/20 1100 08/01/20 1140  BP: (!) 129/91 (!) 122/100 (!) 130/92   Pulse: 69 76 (!) 145 (!) 116  Resp: (!) 29  (!) 27 (!) 33  Temp:      TempSrc:      SpO2: 97% 99% 99% 100%  Weight:  Height:       No intake or output data in the 24 hours ending 08/01/20 1217 Last 3 Weights 08/01/2020 08/01/2020 07/14/2020  Weight (lbs) 290 lb 286 lb 9.6 oz 285 lb 4.8 oz  Weight (kg) 131.543 kg 130 kg 129.411 kg     Body mass index is 46.81 kg/m.  General:  Well nourished, well developed, in no acute distress HEENT: normal Lymph: no adenopathy Neck: JVD difficult to assess due to grith  Endocrine:  No thryomegaly Vascular: No carotid bruits; FA pulses 2+ bilaterally without bruits  Cardiac:  normal S1, S2; Ir IR tachycardic; no murmur Lungs:  clear to auscultation bilaterally, no wheezing, rhonchi or rales  Abd: soft, nontender, no hepatomegaly  Ext: Trace to 1 + edema Musculoskeletal:  No deformities, BUE and BLE strength normal and equal Skin: warm and dry  Neuro:  CNs 2-12 intact, no focal abnormalities noted Psych:  Normal affect    EKG:  The ECG that was done today was personally reviewed and demonstrates  atrial fibrillation with elevated rate and RBBB  Relevant CV Studies:  Echo 07/11/2020 1. Left ventricular ejection fraction, by estimation, is 50 to 55%. The  left ventricle has low normal function. The left ventricle has no regional  wall motion abnormalities. There is moderate left ventricular hypertrophy.  Left ventricular diastolic  parameters are indeterminate.  2. Right ventricular systolic function is mildly reduced. The right  ventricular size is mildly enlarged. There is mildly elevated pulmonary  artery systolic pressure. The estimated right ventricular systolic  pressure is [id number] mmHg.  3. Right atrial size was mildly dilated.  4. The mitral valve is normal in structure. Trivial mitral valve  regurgitation.  5. The aortic valve was not well visualized. Aortic valve regurgitation  is not visualized. No aortic stenosis is present.  6. Aortic dilatation noted. There is mild dilatation of the ascending  aorta, measuring 38 mm.  7. The inferior vena cava is dilated in size with <50% respiratory  variability, suggesting right atrial pressure of 15 mmHg.   Laboratory Data:  High Sensitivity Troponin:   Recent Labs  Lab 07/07/20 1505 07/07/20 1832 08/01/20 0738  TROPONINIHS 14 17 12       Chemistry Recent Labs  Lab 08/01/20 0738  NA 136  K 4.3  CL 100  CO2 26  GLUCOSE 146*  BUN 11  CREATININE 0.96  CALCIUM 9.5  GFRNONAA >60  ANIONGAP 10    Hematology Recent Labs  Lab 08/01/20 0738  WBC 11.6*  RBC 4.05*  HGB 11.2*  HCT 38.3*  MCV 94.6  MCH 27.7  MCHC 29.2*  RDW 17.2*  PLT 278   BNP Recent Labs  Lab 08/01/20 0738  BNP 432.5*    DDimer No results for input(s): DDIMER in the last 168 hours.   Radiology/Studies:  DG Chest 2 View  Result Date: 08/01/2020 CLINICAL DATA:  Shortness of breath and dizziness for 2 days. EXAM: CHEST - 2 VIEW COMPARISON:  Chest x-ray dated 07/07/2020 and chest CT dated 07/14/2020. FINDINGS: Stable cardiomegaly.  Persistent ill-defined opacities at the LEFT lung base, atelectasis versus pneumonia, not significantly changed in size/extent compared to the recent chest CT. Small bilateral pleural effusions. Persistent subtle streaky airspace opacities within the RIGHT upper lobe, corresponding to the atelectasis versus pneumonia described on recent chest CT, stable. No new lung findings. No pneumothorax is seen. No acute appearing osseous abnormality. IMPRESSION: 1. Persistent ill-defined opacities at the LEFT lung base, compatible with atelectasis versus  pneumonia, not significantly changed in size/extent compared to the recent chest CT of 07/14/2020. 2. Small bilateral pleural effusions. 3. Stable atelectasis versus pneumonia within the RIGHT upper lobe. 4. Stable cardiomegaly. Electronically Signed   By: Franki Cabot M.D.   On: 08/01/2020 08:12     Assessment and Plan:   1. Atrial Fibrillation with RVR - HR remained elevated despite amio, metoprolol and diltiazem on last admission.  He underwent successful TEE/DCCV on 07/13/2020. He was discharged on Eliquis and Lopressor. - Now presenting with afib RVR. Unsuccessful cardioversion x 2 in ER.  - Start IV amiodarone with loading dose. Continue Eliquis. Probable cardioversion tomorrow.  - Echo 07/11/2020 with LVEF of 50-55%, no WM abnormality, mildly dilated RA - Continue home BB  2. Abnormal chest x-ray - Atelectasias vs pneumonia - No Fever or fills - WBC 11.6 - Follow closely - He has received [employer] x2 vaccine, last dose in  April. Hasn't got his booster.  - Had COVID 05/2020  3. Chronic diastolic CHF - Volume status stable - BNP 432 - Continue home lasix  4. Chronic DVT - On anticoagulation   5. Recent anemia - Hgb stable at 11.2 - Follow closely.   6. Coronary calcification on recent CT angio of chest 07/14/2020 - Troponin negative - Continue statin   7. OSA on CPAP - ? Compliance   CHA2DS2-VASc Score = 3  This indicates a 3.2%  annual risk of stroke. The patient's score is based upon: CHF History: Yes HTN History: Yes Diabetes History: No Stroke History: No Vascular Disease History: No Age Score: 1 Gender Score: 0   Severity of Illness: The appropriate patient status for this patient is INPATIENT. Inpatient status is judged to be reasonable and necessary in order to provide the required intensity of service to ensure the patient's safety. The patient's presenting symptoms, physical exam findings, and initial radiographic and laboratory data in the context of their chronic comorbidities is felt to place them at high risk for further clinical deterioration. Furthermore, it is not anticipated that the patient will be medically stable for discharge from the hospital within 2 midnights of admission. The following factors support the patient status of inpatient.   " The patient's presenting symptoms include  Chest pressure, shortness of breath, palpitations, orthopnea. " The worrisome physical exam findings include Elevated HF " The initial radiographic and laboratory data are worrisome because of Elevated HR, abnormal CXR " The chronic co-morbidities include - PAF, obesity, chronic diastolic CHF   * I certify that at the point of admission it is my clinical judgment that the patient will require inpatient hospital care spanning beyond 2 midnights from the point of admission due to high intensity of service, high risk for further deterioration and high frequency of surveillance required.*    For questions or updates, please contact [place]  Please consult   Signed, Leanor Kail, [place]  08/01/2020 12:17 PM   History and all data above reviewed.  Patient examined.   The patient had DCCV earlier this month with return to NSR after that procedure.  However, he tells me that since discharge on his BP cuff his HR has been elevated from 90s - 140s.  He presents today with atrial fib with rapid rate.  He came in  because his chronic dyspnea was progressively worse.  He does not feel the fib.  He has anasarca and reports that this is better than it was before he was in the hospital earlier this  month. It looks like his weight is roughly the same now as it was at discharge and down about 10 lbs from his peak on admission earlier this month.  He denies fevers or chills.  He has had no new pain.  His wife has been dressing his legs which have been weeping but which look better than pictures taken during the last admission.   I agree with the findings as above.  The patient exam reveals [id number]   ,  Lungs: Decreased breath sounds  ,  Abd: Positive bowel sounds, no rebound no guarding, Ext Massive edema all of the way up the legs and into his flanks.  Weeping.  .  All available labs, radiology testing, previous records reviewed. Agree with documented assessment and plan. ATRIAL FIB WITH RVR:  Initially the patient was insistent on going home.  However, I convinced him to stay.  He needs aggressive diuresis and needs rate and likely rhythm control.  He failed DCCV in the ED.  We will start IV amiodarone and continue IV Cardizem for rate control.  NPO after MN for probable attempt again at DCCV.  ACUTE ON CHRONIC DIATOLIC HF:  I think that his volume is about the same as it was at discharge but still massively volume overloaded.   He will need IV 80 Lasix 80 mg bid to begin with.   Tessica Cupo  1:40 PM  08/01/2020

## 2020-08-01 NOTE — Progress Notes (Signed)
RT at bedside for cardioversion. After 2nd synchronized shock, pt spo2 began to drop to 40's, pt manually ventilated by this RT with assistance by MD and bed placed in supine position. PT manually ventilated apprxmately 5 minutes and pt began to breathe independently and remains on 4L [place]. VS WNL. RN to call if RT needed further.

## 2020-08-01 NOTE — ED Notes (Signed)
Dinner Tray Ordered @ 1714. 

## 2020-08-01 NOTE — Plan of Care (Signed)

## 2020-08-01 NOTE — Sedation Documentation (Signed)
1 shock at 200 J delivered by Dr. Denton Lank. Pt converted to sinus tach briefly.

## 2020-08-02 ENCOUNTER — Observation Stay (HOSPITAL_COMMUNITY): Payer: Medicare Other | Admitting: Anesthesiology

## 2020-08-02 ENCOUNTER — Encounter (HOSPITAL_COMMUNITY): Payer: Self-pay | Admitting: Cardiology

## 2020-08-02 ENCOUNTER — Encounter (HOSPITAL_COMMUNITY)
Admission: EM | Disposition: A | Discharge status: Home / Self Care | Payer: Self-pay | Source: Home / Self Care | Attending: Cardiology

## 2020-08-02 DIAGNOSIS — E785 Hyperlipidemia, unspecified: Secondary | ICD-10-CM | POA: Diagnosis present

## 2020-08-02 DIAGNOSIS — I2584 Coronary atherosclerosis due to calcified coronary lesion: Secondary | ICD-10-CM | POA: Diagnosis present

## 2020-08-02 DIAGNOSIS — I50813 Acute on chronic right heart failure: Secondary | ICD-10-CM | POA: Diagnosis not present

## 2020-08-02 DIAGNOSIS — K760 Fatty (change of) liver, not elsewhere classified: Secondary | ICD-10-CM | POA: Diagnosis present

## 2020-08-02 DIAGNOSIS — Z79899 Other long term (current) drug therapy: Secondary | ICD-10-CM | POA: Diagnosis not present

## 2020-08-02 DIAGNOSIS — M1990 Unspecified osteoarthritis, unspecified site: Secondary | ICD-10-CM | POA: Diagnosis present

## 2020-08-02 DIAGNOSIS — I4892 Unspecified atrial flutter: Secondary | ICD-10-CM | POA: Diagnosis present

## 2020-08-02 DIAGNOSIS — I4891 Unspecified atrial fibrillation: Secondary | ICD-10-CM | POA: Diagnosis not present

## 2020-08-02 DIAGNOSIS — G4733 Obstructive sleep apnea (adult) (pediatric): Secondary | ICD-10-CM | POA: Diagnosis present

## 2020-08-02 DIAGNOSIS — Z6841 Body Mass Index (BMI) 40.0 and over, adult: Secondary | ICD-10-CM | POA: Diagnosis not present

## 2020-08-02 DIAGNOSIS — I87009 Postthrombotic syndrome without complications of unspecified extremity: Secondary | ICD-10-CM | POA: Diagnosis present

## 2020-08-02 DIAGNOSIS — I872 Venous insufficiency (chronic) (peripheral): Secondary | ICD-10-CM | POA: Diagnosis present

## 2020-08-02 DIAGNOSIS — Z7902 Long term (current) use of antithrombotics/antiplatelets: Secondary | ICD-10-CM | POA: Diagnosis not present

## 2020-08-02 DIAGNOSIS — I5033 Acute on chronic diastolic (congestive) heart failure: Secondary | ICD-10-CM | POA: Diagnosis present

## 2020-08-02 DIAGNOSIS — Z8616 Personal history of COVID-19: Secondary | ICD-10-CM | POA: Diagnosis not present

## 2020-08-02 DIAGNOSIS — Z86718 Personal history of other venous thrombosis and embolism: Secondary | ICD-10-CM | POA: Diagnosis not present

## 2020-08-02 DIAGNOSIS — R7303 Prediabetes: Secondary | ICD-10-CM | POA: Diagnosis present

## 2020-08-02 DIAGNOSIS — I110 Hypertensive heart disease with heart failure: Secondary | ICD-10-CM | POA: Diagnosis present

## 2020-08-02 DIAGNOSIS — I2510 Atherosclerotic heart disease of native coronary artery without angina pectoris: Secondary | ICD-10-CM | POA: Diagnosis present

## 2020-08-02 DIAGNOSIS — I480 Paroxysmal atrial fibrillation: Secondary | ICD-10-CM | POA: Diagnosis present

## 2020-08-02 DIAGNOSIS — Z20822 Contact with and (suspected) exposure to covid-19: Secondary | ICD-10-CM | POA: Diagnosis present

## 2020-08-02 HISTORY — PX: CARDIOVERSION: SHX1299

## 2020-08-02 LAB — CBC
HCT: 36.3 % — ABNORMAL LOW (ref 39.0–52.0)
Hemoglobin: 11.2 g/dL — ABNORMAL LOW (ref 13.0–17.0)
MCH: 28.4 pg (ref 26.0–34.0)
MCHC: 30.9 g/dL (ref 30.0–36.0)
MCV: 92.1 fL (ref 80.0–100.0)
Platelets: 254 10*3/uL (ref 150–400)
RBC: 3.94 MIL/uL — ABNORMAL LOW (ref 4.22–5.81)
RDW: 17.3 % — ABNORMAL HIGH (ref 11.5–15.5)
WBC: 9.2 10*3/uL (ref 4.0–10.5)
nRBC: 0 % (ref 0.0–0.2)

## 2020-08-02 LAB — BASIC METABOLIC PANEL
Anion gap: 10 (ref 5–15)
BUN: 12 mg/dL (ref 8–23)
CO2: 32 mmol/L (ref 22–32)
Calcium: 9.3 mg/dL (ref 8.9–10.3)
Chloride: 94 mmol/L — ABNORMAL LOW (ref 98–111)
Creatinine, Ser: 1.03 mg/dL (ref 0.61–1.24)
GFR, Estimated: >60 mL/min (ref >60)
Glucose, Bld: 116 mg/dL — ABNORMAL HIGH (ref 70–99)
Potassium: 4.3 mmol/L (ref 3.5–5.1)
Sodium: 136 mmol/L (ref 135–145)

## 2020-08-02 SURGERY — CARDIOVERSION
Anesthesia: General

## 2020-08-02 MED ORDER — SODIUM CHLORIDE 0.9 % IV SOLN: INTRAVENOUS | Status: DC | PRN

## 2020-08-02 MED ORDER — METOPROLOL TARTRATE 50 MG PO TABS
50.0000 mg | ORAL_TABLET | Freq: Four times a day (QID) | ORAL | Status: DC
  Administered 2020-08-02 – 2020-08-03 (×3): 50 mg via ORAL
  Filled 2020-08-02 (×3): qty 1

## 2020-08-02 MED ORDER — PROPOFOL 10 MG/ML IV BOLUS
INTRAVENOUS | Status: DC | PRN
  Administered 2020-08-02: 50 mg via INTRAVENOUS

## 2020-08-02 MED ORDER — LIDOCAINE HCL (CARDIAC) PF 100 MG/5ML IV SOSY
PREFILLED_SYRINGE | INTRAVENOUS | Status: DC | PRN
  Administered 2020-08-02: 60 mg via INTRAVENOUS

## 2020-08-02 NOTE — CV Procedure (Signed)
   Electrical Cardioversion Procedure Note Shane Diaz [id number] 31-Oct-1945  Procedure: Electrical Cardioversion Indications:  Atrial Fibrillation  Time Out: Verified patient identification, verified procedure,medications/allergies/relevent history reviewed, required imaging and test results available.  Performed  Procedure Details  The patient was NPO after midnight. Anesthesia was administered at the beside  by Dr.Finucane with 60 mg of lidocaine and 50  mg propofol.  Cardioversion was done with synchronized biphasic defibrillation with AP pads with 200 Joules- two attempts were made.  The patient converted to failed to return to normal rhythm. The patient tolerated the procedure well   IMPRESSION:  Unsuccessful cardioversion of atrial fibrillation    Fabyan Loughmiller A Ludene Stokke 08/02/2020, 10:01 AM

## 2020-08-02 NOTE — Transfer of Care (Signed)
Immediate Anesthesia Transfer of Care Note  Patient: Shane Diaz  Procedure(s) Performed: CARDIOVERSION (N/A )  Patient Location: Endoscopy Unit  Anesthesia Type:General  Level of Consciousness: awake, alert  and oriented  Airway & Oxygen Therapy: Patient Spontanous Breathing and Patient connected to nasal cannula oxygen  Post-op Assessment: Report given to RN, Post -op Vital signs reviewed and stable and Patient moving all extremities X 4  Post vital signs: Reviewed and stable  Last Vitals:  Vitals Value Taken Time  BP    Temp    Pulse    Resp    SpO2      Last Pain:  Vitals:   08/02/20 0925  TempSrc: Tympanic  PainSc: 0-No pain         Complications: No complications documented.

## 2020-08-02 NOTE — Progress Notes (Signed)
   08/01/20 1833  Assess: MEWS Score  Temp 97.9 F (36.6 C)  BP 118/81  Pulse Rate (!) 137  ECG Heart Rate (!) 137  Resp (!) 22  Level of Consciousness Alert  SpO2 100 %  O2 Device Nasal Cannula  O2 Flow Rate (L/min) 3 L/min  Assess: MEWS Score  MEWS Temp 0  MEWS Systolic 0  MEWS Pulse 3  MEWS RR 1  MEWS LOC 0  MEWS Score 4  MEWS Score Color Red  Assess: if the MEWS score is Yellow or Red  Were vital signs taken at a resting state? Yes  Focused Assessment No change from prior assessment  Early Detection of Sepsis Score *See Row Information* Low  MEWS guidelines implemented *See Row Information* Yes  Treat  Pain Scale 0-10  Pain Score 0  Notify: Charge Nurse/RN  Name of Charge Nurse/RN Notified Loucinda Croy, RN  Date Charge Nurse/RN Notified 08/01/20  Time Charge Nurse/RN Notified 1833  Document  Patient Outcome Other (Comment)  Progress note created (see row info) Yes

## 2020-08-02 NOTE — Discharge Instructions (Addendum)
Heart Healthy Diet  Low salt diet Weigh every morning and if weight climbs by more than 3 pounds in a day or 5 pounds in a week please call to let us know so we can adjust meds.    Do not miss any Eliquis  We added amiodarone 200 mg take 2 tabs twice a day for 14 days then begininning 08/19/19 decrease to 1 tab once a day.     Call if symptoms increase or weight increases.    Your pharmacy closes at 6 pm today and is not open tomorrow     Information on my medicine - ELIQUIS (apixaban)  Why was Eliquis prescribed for you? Eliquis was prescribed for you to reduce the risk of a blood clot forming that can cause a stroke if you have a medical condition called atrial fibrillation (a type of irregular heartbeat).  What do You need to know about Eliquis ? Take your Eliquis TWICE DAILY - one tablet in the morning and one tablet in the evening with or without food. If you have difficulty swallowing the tablet whole please discuss with your pharmacist how to take the medication safely.  Take Eliquis exactly as prescribed by your doctor and DO NOT stop taking Eliquis without talking to the doctor who prescribed the medication.  Stopping may increase your risk of developing a stroke.  Refill your prescription before you run out.  After discharge, you should have regular check-up appointments with your healthcare provider that is prescribing your Eliquis.  In the future your dose may need to be changed if your kidney function or weight changes by a significant amount or as you get older.  What do you do if you miss a dose? If you miss a dose, take it as soon as you remember on the same day and resume taking twice daily.  Do not take more than one dose of ELIQUIS at the same time to make up a missed dose.  Important Safety Information A possible side effect of Eliquis is bleeding. You should call your healthcare provider right away if you experience any of the following: ? Bleeding from an  injury or your nose that does not stop. ? Unusual colored urine (red or dark brown) or unusual colored stools (red or black). ? Unusual bruising for unknown reasons. ? A serious fall or if you hit your head (even if there is no bleeding).  Some medicines may interact with Eliquis and might increase your risk of bleeding or clotting while on Eliquis. To help avoid this, consult your healthcare provider or pharmacist prior to using any new prescription or non-prescription medications, including herbals, vitamins, non-steroidal anti-inflammatory drugs (NSAIDs) and supplements.  This website has more information on Eliquis (apixaban): http://www.eliquis.com/eliquis/home

## 2020-08-02 NOTE — Progress Notes (Addendum)
Progress Note  Patient Name: Shane Diaz Date of Encounter: 08/02/2020  [facility] Cardiologist: Sanda Klein, MD   Subjective   No chest pain and no SOB, has not eaten.  Inpatient Medications    Scheduled Meds: . apixaban  5 mg Oral BID  . atorvastatin  40 mg Oral Daily  . ferrous sulfate  325 mg Oral BID WC  . furosemide  80 mg Intravenous BID  . metoprolol tartrate  50 mg Oral BID  . pantoprazole  40 mg Oral Daily  . cyanocobalamin  1,000 mcg Oral Daily   Continuous Infusions: . amiodarone 30 mg/hr (08/02/20 0115)  . diltiazem (CARDIZEM) infusion Stopped (08/01/20 1715)   PRN Meds: acetaminophen, nitroGLYCERIN, ondansetron (ZOFRAN) IV   Vital Signs    Vitals:   08/02/20 0000 08/02/20 0500 08/02/20 0517 08/02/20 0518  BP: 126/80 121/88 124/82 124/82  Pulse: 96  98 98  Resp: 20 20 20 20   Temp: 97.7 F (36.5 C) 97.7 F (36.5 C) 97.7 F (36.5 C) 97.7 F (36.5 C)  TempSrc:  Oral  Oral  SpO2: 96% 96%  97%  Weight:  131.1 kg    Height:        Intake/Output Summary (Last 24 hours) at 08/02/2020 0833 Last data filed at 08/02/2020 0437 Gross per 24 hour  Intake 386.37 ml  Output 2200 ml  Net -1813.63 ml   Last 3 Weights 08/02/2020 08/01/2020 08/01/2020  Weight (lbs) 289 lb 290 lb 286 lb 9.6 oz  Weight (kg) 131.09 kg 131.543 kg 130 kg      Telemetry    A fib , rapid at times - Personally Reviewed  ECG    No new - Personally Reviewed  Physical Exam   GEN: No acute distress.   Neck: No JVD sitting up in chair Cardiac: irreg irreg, no murmurs, rubs, or gallops.  Respiratory: Clear to auscultation bilaterally. GI: Soft, nontender, non-distended  MS: 1-2+ lower ext edema; No deformity. Neuro:  Nonfocal  Psych: Normal affect   Labs    High Sensitivity Troponin:   Recent Labs  Lab 07/07/20 1505 07/07/20 1832 08/01/20 0738  TROPONINIHS 14 17 12       Chemistry Recent Labs  Lab 08/01/20 0738 08/02/20 0149  NA 136 136  K 4.3 4.3   CL 100 94*  CO2 26 32  GLUCOSE 146* 116*  BUN 11 12  CREATININE 0.96 1.03  CALCIUM 9.5 9.3  GFRNONAA >60 >60  ANIONGAP 10 10     Hematology Recent Labs  Lab 08/01/20 0738 08/02/20 0149  WBC 11.6* 9.2  RBC 4.05* 3.94*  HGB 11.2* 11.2*  HCT 38.3* 36.3*  MCV 94.6 92.1  MCH 27.7 28.4  MCHC 29.2* 30.9  RDW 17.2* 17.3*  PLT 278 254    BNP Recent Labs  Lab 08/01/20 0738  BNP 432.5*     DDimer No results for input(s): DDIMER in the last 168 hours.   Radiology    DG Chest 2 View  Result Date: 08/01/2020 CLINICAL DATA:  Shortness of breath and dizziness for 2 days. EXAM: CHEST - 2 VIEW COMPARISON:  Chest x-ray dated 07/07/2020 and chest CT dated 07/14/2020. FINDINGS: Stable cardiomegaly. Persistent ill-defined opacities at the LEFT lung base, atelectasis versus pneumonia, not significantly changed in size/extent compared to the recent chest CT. Small bilateral pleural effusions. Persistent subtle streaky airspace opacities within the RIGHT upper lobe, corresponding to the atelectasis versus pneumonia described on recent chest CT, stable. No new lung findings. No  pneumothorax is seen. No acute appearing osseous abnormality. IMPRESSION: 1. Persistent ill-defined opacities at the LEFT lung base, compatible with atelectasis versus pneumonia, not significantly changed in size/extent compared to the recent chest CT of 07/14/2020. 2. Small bilateral pleural effusions. 3. Stable atelectasis versus pneumonia within the RIGHT upper lobe. 4. Stable cardiomegaly. Electronically Signed   By: Franki Cabot M.D.   On: 08/01/2020 08:12    Cardiac Studies   07/13/20 TEE IMPRESSIONS    1. TEE with no RAA/LAA thrombus DCC x 1 with 200J converted from atrial  flutter rate 133 bpm to NSR rate 88 bpm On eliquis with no immediate  neurologic sequelae.  2. EF decreased with patient in rapid atrial flutter rate 133. Left  ventricular ejection fraction, by estimation, is 45 to 50%. The left   ventricle has mildly decreased function. There is mild left ventricular  hypertrophy.  3. Right ventricular systolic function is severely reduced. The right  ventricular size is severely enlarged.  4. Left atrial size was moderately dilated. No left atrial/left atrial  appendage thrombus was detected.  5. Right atrial size was severely dilated.  6. The mitral valve is normal in structure. Mild mitral valve  regurgitation.  7. Tricuspid valve regurgitation is moderate.  8. The aortic valve is tricuspid. Aortic valve regurgitation is mild.  Mild aortic valve sclerosis is present, with no evidence of aortic valve  stenosis.   FINDINGS  Left Ventricle: EF decreased with patient in rapid atrial flutter rate  133. Left ventricular ejection fraction, by estimation, is 45 to 50%. The  left ventricle has mildly decreased function. The left ventricular  internal cavity size was normal in size.  There is mild left ventricular hypertrophy.   Right Ventricle: The right ventricular size is severely enlarged. Right  vetricular wall thickness was not assessed. Right ventricular systolic  function is severely reduced.   Left Atrium: Left atrial size was moderately dilated. No left atrial/left  atrial appendage thrombus was detected.   Right Atrium: Right atrial size was severely dilated.   Pericardium: There is no evidence of pericardial effusion.   Mitral Valve: The mitral valve is normal in structure. Mild mitral valve  regurgitation.   Tricuspid Valve: The tricuspid valve is normal in structure. Tricuspid  valve regurgitation is moderate.   Aortic Valve: The aortic valve is tricuspid. Aortic valve regurgitation is  mild. Mild aortic valve sclerosis is present, with no evidence of aortic  valve stenosis.   Pulmonic Valve: The pulmonic valve was normal in structure. Pulmonic valve  regurgitation is mild.   Aorta: The aortic root is normal in size and structure.   IAS/Shunts:  No atrial level shunt detected by color flow Doppler.   Additional Comments: TEE with no RAA/LAA thrombus DCC x 1 with 200J  converted from atrial flutter rate 133 bpm to NSR rate 88 bpm On eliquis  with no immediate neurologic sequelae.   Patient Profile     74 y.o. 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   Assessment & Plan    1. Atrial Fibrillation with RVR - HR remained elevated despite amio, metoprolol and diltiazem on last admission.  He underwent successful TEE/DCCV on 07/13/2020. He was discharged on Eliquis and Lopressor. - Now presenting with afib RVR. Unsuccessful cardioversion x 2 in  ER.  - Started IV amiodarone with loading dose. Continue Eliquis. for cardioversion today around 2 pm. On lopressor 50 BID IV dilt stopped - Echo 07/11/2020 with LVEF of 50-55%, no WM abnormality, mildly dilated RA - Continue home BB  2. Abnormal chest x-ray - Atelectasias vs pneumonia - No Fever or fills - WBC 11.6 on admit now 11.2  - Follow closely - He has received Pfizer x2 vaccine, last dose in  April. Hasn't got his booster.  - Had COVID 05/2020  3. Chronic diastolic CHF - Volume status stable - BNP 432 - IV lasix 80 mg BID and is neg 1813 ml today   4. Chronic DVT - On anticoagulation   5. Recent anemia - Hgb stable at 11.2 - Follow closely.   6. Coronary calcification on recent CT angio of chest 07/14/2020 - Troponin negative - Continue statin   7. OSA on CPAP - ? Compliance   For questions or updates, please contact [facility] Please consult [email or web address] for contact info under      Signed, Nada Boozer, NP  08/02/2020, 8:33 AM    History and all data above reviewed.  Patient examined.  I agree with the findings as above.   The patient failed to convert to NSR today with DCCV.   The patient exam reveals [id number]  ,  Lungs:  clear  ,  Abd: Positive bowel sounds, no rebound no guarding, Ext    Severe edema  .  All available labs, radiology testing, previous records reviewed. Agree with documented assessment and plan.  Atrial fib:  Failed DCCV.  Continue IV amiodarone.  I am going to increase his PO beta blocker.  If rate is OK home tomorrow with PO amio load with plans for repeat DCCV in the weeks to come.  He very much wants to go home although he has many lbs of fluid to get diurese.  He and I talked about this.  We talked about salt (and his homemade sausage).   He is less volume overloaded than he was previously and I think can continue diuresis at home with close follow up.    Reagen Haberman  1:32 PM  08/02/2020

## 2020-08-02 NOTE — Anesthesia Preprocedure Evaluation (Addendum)
Anesthesia Evaluation  Patient identified by MRN, date of birth, ID band Patient awake    Reviewed: Allergy & Precautions, NPO status , Patient's Chart, lab work & pertinent test results  Airway Mallampati: IV  TM Distance: >3 FB Neck ROM: Full    Dental no notable dental hx. (+) Teeth Intact, Dental Advisory Given   Pulmonary sleep apnea and Continuous Positive Airway Pressure Ventilation , pneumonia (COVID 05/2020, still w/ cough), unresolved,  Admitted 10/2021for Covid pneumonia and was found to have A. fib with RVR, treated with diltiazem   Pulmonary exam normal breath sounds clear to auscultation       Cardiovascular hypertension, Pt. on home beta blockers +CHF (LVEF 45-50%) and + DVT (2015, 2016- on eliquis)  Normal cardiovascular exam+ dysrhythmias (eliquis, metoprolol) Atrial Fibrillation + Valvular Problems/Murmurs (mild MR, AI) AI and MR  Rhythm:Irregular Rate:Normal  Last echo: 1. TEE with no RAA/LAA thrombus DCC x 1 with 200J converted from atrial  flutter rate 133 bpm to NSR rate 88 bpm On eliquis with no immediate  neurologic sequelae.  2. EF decreased with patient in rapid atrial flutter rate 133. Left  ventricular ejection fraction, by estimation, is 45 to 50%. The left  ventricle has mildly decreased function. There is mild left ventricular  hypertrophy.  3. Right ventricular systolic function is severely reduced. The right  ventricular size is severely enlarged.  4. Left atrial size was moderately dilated. No left atrial/left atrial  appendage thrombus was detected.  5. Right atrial size was severely dilated.  6. The mitral valve is normal in structure. Mild mitral valve  regurgitation.  7. Tricuspid valve regurgitation is moderate.  8. The aortic valve is tricuspid. Aortic valve regurgitation is mild.  Mild aortic valve sclerosis is present, with no evidence of aortic valve  stenosis.     Neuro/Psych negative neurological ROS  negative psych ROS   GI/Hepatic GERD  Medicated and Controlled,Fatty liver Diverticulosis   Endo/Other  Morbid obesityBMI 47  Renal/GU negative Renal ROS  negative genitourinary   Musculoskeletal  (+) Arthritis , Osteoarthritis,    Abdominal (+) + obese,   Peds  Hematology   Anesthesia Other Findings   Reproductive/Obstetrics negative OB ROS                            Anesthesia Physical Anesthesia Plan  ASA: IV  Anesthesia Plan: General   Post-op Pain Management:    Induction: Intravenous  PONV Risk Score and Plan: TIVA and Treatment may vary due to age or medical condition  Airway Management Planned: Mask and Natural Airway  Additional Equipment: None  Intra-op Plan:   Post-operative Plan:   Informed Consent: I have reviewed the patients History and Physical, chart, labs and discussed the procedure including the risks, benefits and alternatives for the proposed anesthesia with the patient or authorized representative who has indicated his/her understanding and acceptance.     Dental advisory given  Plan Discussed with: CRNA  Anesthesia Plan Comments:        Anesthesia Quick Evaluation

## 2020-08-02 NOTE — Anesthesia Procedure Notes (Signed)
Procedure Name: General with mask airway Date/Time: 08/02/2020 9:45 AM Performed by: Marena Chancy, CRNA Pre-anesthesia Checklist: Timeout performed, Patient being monitored, Suction available, Emergency Drugs available and Patient identified Patient Re-evaluated:Patient Re-evaluated prior to induction Oxygen Delivery Method: Ambu bag Preoxygenation: Pre-oxygenation with 100% oxygen Induction Type: IV induction Ventilation: Oral airway inserted - appropriate to patient size

## 2020-08-02 NOTE — Plan of Care (Signed)

## 2020-08-02 NOTE — Anesthesia Postprocedure Evaluation (Signed)
Anesthesia Post Note  Patient: Nitish Prabhakar  Procedure(s) Performed: CARDIOVERSION (N/A )     Patient location during evaluation: PACU Anesthesia Type: General Level of consciousness: awake and alert, oriented and patient cooperative Pain management: pain level controlled Vital Signs Assessment: post-procedure vital signs reviewed and stable Respiratory status: spontaneous breathing, nonlabored ventilation and respiratory function stable Cardiovascular status: blood pressure returned to baseline and stable Postop Assessment: no apparent nausea or vomiting Anesthetic complications: no   No complications documented.  Last Vitals:  Vitals:   08/02/20 1021 08/02/20 1045  BP: 105/76 (!) 123/96  Pulse:  (!) 118  Resp: (!) 27 20  Temp:  36.5 C  SpO2: 97% 100%    Last Pain:  Vitals:   08/02/20 1045  TempSrc: Oral  PainSc: 0-No pain                 Lannie Fields

## 2020-08-02 NOTE — Interval H&P Note (Signed)
History and Physical Interval Note:  08/02/2020 9:48 AM  Hameed Minich  has presented today for surgery, with the diagnosis of A-FIB.  The various methods of treatment have been discussed with the patient and family. After consideration of risks, benefits and other options for treatment, the patient has consented to  Procedure(s): CARDIOVERSION (N/A) as a surgical intervention.  The patient's history has been reviewed, patient examined, no change in status, stable for surgery.  I have reviewed the patient's chart and labs.  Questions were answered to the patient's satisfaction.     Momin Misko A Eliga Arvie   

## 2020-08-02 NOTE — Interval H&P Note (Signed)
History and Physical Interval Note:  08/02/2020 9:48 AM  Shane Diaz  has presented today for surgery, with the diagnosis of A-FIB.  The various methods of treatment have been discussed with the patient and family. After consideration of risks, benefits and other options for treatment, the patient has consented to  Procedure(s): CARDIOVERSION (N/A) as a surgical intervention.  The patient's history has been reviewed, patient examined, no change in status, stable for surgery.  I have reviewed the patient's chart and labs.  Questions were answered to the patient's satisfaction.     [name] A Josephanthony Tindel

## 2020-08-03 ENCOUNTER — Encounter (HOSPITAL_COMMUNITY): Payer: Self-pay | Admitting: Cardiology

## 2020-08-03 DIAGNOSIS — I4891 Unspecified atrial fibrillation: Secondary | ICD-10-CM | POA: Diagnosis not present

## 2020-08-03 DIAGNOSIS — I82403 Acute embolism and thrombosis of unspecified deep veins of lower extremity, bilateral: Secondary | ICD-10-CM | POA: Diagnosis present

## 2020-08-03 DIAGNOSIS — I5031 Acute diastolic (congestive) heart failure: Secondary | ICD-10-CM

## 2020-08-03 DIAGNOSIS — I50813 Acute on chronic right heart failure: Secondary | ICD-10-CM

## 2020-08-03 DIAGNOSIS — I5033 Acute on chronic diastolic (congestive) heart failure: Secondary | ICD-10-CM | POA: Diagnosis not present

## 2020-08-03 DIAGNOSIS — I87009 Postthrombotic syndrome without complications of unspecified extremity: Secondary | ICD-10-CM

## 2020-08-03 DIAGNOSIS — G4733 Obstructive sleep apnea (adult) (pediatric): Secondary | ICD-10-CM | POA: Diagnosis present

## 2020-08-03 DIAGNOSIS — I2510 Atherosclerotic heart disease of native coronary artery without angina pectoris: Secondary | ICD-10-CM

## 2020-08-03 HISTORY — DX: Obstructive sleep apnea (adult) (pediatric): G47.33

## 2020-08-03 HISTORY — DX: Acute diastolic (congestive) heart failure: I50.31

## 2020-08-03 MED ORDER — AMIODARONE HCL 200 MG PO TABS: 400.0000 mg | ORAL_TABLET | Freq: Two times a day (BID) | ORAL | 6 refills | Status: DC

## 2020-08-03 MED ORDER — AMIODARONE HCL 200 MG PO TABS: 200.0000 mg | ORAL_TABLET | Freq: Every day | ORAL | Status: DC

## 2020-08-03 MED ORDER — POTASSIUM CHLORIDE CRYS ER 20 MEQ PO TBCR: 20.0000 meq | EXTENDED_RELEASE_TABLET | Freq: Three times a day (TID) | ORAL | Status: DC

## 2020-08-03 MED ORDER — FUROSEMIDE 80 MG PO TABS: 80.0000 mg | ORAL_TABLET | Freq: Two times a day (BID) | ORAL | Status: DC

## 2020-08-03 MED ORDER — METOPROLOL TARTRATE 100 MG PO TABS: 100.0000 mg | ORAL_TABLET | Freq: Two times a day (BID) | ORAL | Status: DC

## 2020-08-03 MED ORDER — POTASSIUM CHLORIDE CRYS ER 20 MEQ PO TBCR: 20.0000 meq | EXTENDED_RELEASE_TABLET | Freq: Three times a day (TID) | ORAL | 6 refills | Status: DC

## 2020-08-03 MED ORDER — FUROSEMIDE 40 MG PO TABS: 80.0000 mg | ORAL_TABLET | Freq: Two times a day (BID) | ORAL | 6 refills | Status: DC

## 2020-08-03 MED ORDER — AMIODARONE HCL 200 MG PO TABS
400.0000 mg | ORAL_TABLET | Freq: Every day | ORAL | Status: DC
  Administered 2020-08-03: 400 mg via ORAL
  Filled 2020-08-03: qty 2

## 2020-08-03 MED ORDER — FUROSEMIDE 40 MG PO TABS: 40.0000 mg | ORAL_TABLET | Freq: Every day | ORAL | Status: DC

## 2020-08-03 MED ORDER — NITROGLYCERIN 0.4 MG SL SUBL: 0.4000 mg | SUBLINGUAL_TABLET | SUBLINGUAL | 4 refills | Status: DC | PRN

## 2020-08-03 MED ORDER — METOPROLOL TARTRATE 100 MG PO TABS: 100.0000 mg | ORAL_TABLET | Freq: Two times a day (BID) | ORAL | 6 refills | Status: DC

## 2020-08-03 NOTE — Discharge Summary (Signed)
Discharge Summary    Patient ID: Shane Diaz MRN: [id number]; DOB: 21-Feb-1946  Admit date: 08/01/2020 Discharge date: 08/03/2020  Primary Care Provider: [name], MD  Primary Cardiologist: Sanda Klein, MD  Primary Electrophysiologist:  None   Discharge Diagnoses    Principal Problem:   Atrial fibrillation with RVR [facility]) Active Problems:   Essential hypertension   Acute diastolic heart failure (HCC)   OSA (obstructive sleep apnea)   DVT, bilateral lower limbs (HCC) hx of   Coronary artery calcification seen on CT scan     Diagnostic Studies/Procedures    Cardioversion 08/02/2020   The patient was NPO after midnight. Anesthesia was administered at the beside by Dr.Finucanewith 60mg  of lidocaine and50mg  propofol. Cardioversion was done with synchronized biphasic defibrillation with AP pads with 200Joules- two attempts were made. The patient converted to failed to return to normal rhythm. The patient tolerated the procedure well   IMPRESSION:  Unsuccessful cardioversion of atrial fibrillation _____________   History of Present Illness     Shane Diaz is a 74 y.o. 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  He had been hospitalized 12/4 to 12/11 with CHF and a flutter and was anticoagulated and had TEE DCCV that was successful.  Discharged on lopressor and eliquis.   Now with this admit back in a flutter and acute CHF.  Weight was up about 40 lbs since his discharge.  He had hx of COVID in Oct. 2021 despite vaccine in April 2021.   Hx of DVT as well.     BNP on admit 432.    His legs on exam had been weeping as well, but improved from last admit.      In ER he was DCCV X 2 that was unsuccessful.   Pt was admitted on IV dilt drip  and then IV amiodarone added.    His rate slowed and IV dilt stopped.  eliquis continued  Hospital Course     Consultants: none   Pt did well over night and diuresed, he underwent DCCV on 08/02/20 that was unsuccessful.  2 attempts were made.   Pt agreed to remain in hospital overnight to further diuresis and continue with IV amiodarone.   Today wt down 1 Kg but pt feels better.  He is neg 2452 ml.  Wt is 130.7 Kg (287.54 lbs)  Rate is improved controlled but still elevated.  He has no SOB . Pt is insistent that he should be discharged.  He is still 20 lbs above dry wt.  We changed to po diuretics and Dr. Sallyanne Kuster had long discussion about salt, pt makes his own sausage and pickles his own sauerkraut.      We will place on oral amiodarone.  BB has been doubled.  Plan for repeat DCCV after 30 days on amiodarone loading.  He was made aware of not missing Eliquis doses.     Did the patient have an acute coronary syndrome (MI, NSTEMI, STEMI, etc) this admission?:  No                               Did the patient have a percutaneous coronary intervention (stent / angioplasty)?:  No.       _____________  Discharge Vitals Blood pressure 120/77,  pulse 95, temperature (P) 97.7 F (36.5 C), temperature source (P) Oral, resp. rate 20, height 5\' 6"  (1.676 m), weight 130.7 kg, SpO2 97 %.  Filed Weights   08/02/20 0500 08/02/20 0925 08/03/20 0500  Weight: 131.1 kg 131.1 kg 130.7 kg    Labs & Radiologic Studies    CBC Recent Labs    08/01/20 0738 08/02/20 0149  WBC 11.6* 9.2  HGB 11.2* 11.2*  HCT 38.3* 36.3*  MCV 94.6 92.1  PLT 278 0000000   Basic Metabolic Panel Recent Labs    08/01/20 0738 08/02/20 0149  NA 136 136  K 4.3 4.3  CL 100 94*  CO2 26 32  GLUCOSE 146* 116*  BUN 11 12  CREATININE 0.96 1.03  CALCIUM 9.5 9.3   Liver Function Tests No results for input(s): AST, ALT, ALKPHOS, BILITOT, PROT, ALBUMIN in the last 72 hours. No results for input(s): LIPASE, AMYLASE in the last  72 hours. High Sensitivity Troponin:   Recent Labs  Lab 07/07/20 1505 07/07/20 1832 08/01/20 0738  TROPONINIHS 14 17 12     BNP Invalid input(s): POCBNP D-Dimer No results for input(s): DDIMER in the last 72 hours. Hemoglobin A1C No results for input(s): HGBA1C in the last 72 hours. Fasting Lipid Panel No results for input(s): CHOL, HDL, LDLCALC, TRIG, CHOLHDL, LDLDIRECT in the last 72 hours. Thyroid Function Tests No results for input(s): TSH, T4TOTAL, T3FREE, THYROIDAB in the last 72 hours.  Invalid input(s): FREET3 _____________  DG Chest 2 View  Result Date: 08/01/2020 CLINICAL DATA:  Shortness of breath and dizziness for 2 days. EXAM: CHEST - 2 VIEW COMPARISON:  Chest x-ray dated 07/07/2020 and chest CT dated 07/14/2020. FINDINGS: Stable cardiomegaly. Persistent ill-defined opacities at the LEFT lung base, atelectasis versus pneumonia, not significantly changed in size/extent compared to the recent chest CT. Small bilateral pleural effusions. Persistent subtle streaky airspace opacities within the RIGHT upper lobe, corresponding to the atelectasis versus pneumonia described on recent chest CT, stable. No new lung findings. No pneumothorax is seen. No acute appearing osseous abnormality. IMPRESSION: 1. Persistent ill-defined opacities at the LEFT lung base, compatible with atelectasis versus pneumonia, not significantly changed in size/extent compared to the recent chest CT of 07/14/2020. 2. Small bilateral pleural effusions. 3. Stable atelectasis versus pneumonia within the RIGHT upper lobe. 4. Stable cardiomegaly. Electronically Signed   By: Franki Cabot M.D.   On: 08/01/2020 08:12   CT ANGIO CHEST PE W OR WO CONTRAST  Result Date: 07/14/2020 CLINICAL DATA:  Chest pain, shortness of breath. EXAM: CT ANGIOGRAPHY CHEST WITH CONTRAST TECHNIQUE: Multidetector CT imaging of the chest was performed using the standard protocol during bolus administration of intravenous contrast.  Multiplanar CT image reconstructions and MIPs were obtained to evaluate the vascular anatomy. CONTRAST:  58mL OMNIPAQUE IOHEXOL 350 MG/ML SOLN COMPARISON:  None. FINDINGS: Cardiovascular: Satisfactory opacification of the pulmonary arteries to the segmental level. No evidence of pulmonary embolism. Mild cardiomegaly is noted. Minimal pericardial effusion is noted. Mild coronary artery calcifications are noted. Atherosclerosis of thoracic aorta is noted without aneurysm formation. Mediastinum/Nodes: No enlarged mediastinal, hilar, or axillary lymph nodes. Thyroid gland, trachea, and esophagus demonstrate no significant findings. Lungs/Pleura: Minimal bilateral pleural effusions are noted with adjacent subsegmental atelectasis. No pneumothorax is noted. Mild right upper lobe atelectasis or infiltrate is noted. Probable subsegmental atelectasis is seen in the lingular aspect of the left upper lobe. Upper Abdomen: No acute abnormality. Musculoskeletal: No chest wall abnormality. No acute or significant osseous findings. Review of the  MIP images confirms the above findings. IMPRESSION: 1. No definite evidence of pulmonary embolus. 2. Minimal bilateral pleural effusions are noted with adjacent subsegmental atelectasis. 3. Mild right upper lobe atelectasis or infiltrate is noted. 4. Probable subsegmental atelectasis is seen in the lingular aspect of the left upper lobe. 5. Mild coronary artery calcifications are noted suggesting coronary artery disease. 6. Aortic atherosclerosis. Aortic Atherosclerosis (ICD10-I70.0). Electronically Signed   By: Lupita Raider M.D.   On: 07/14/2020 09:27   DG Chest Port 1 View  Result Date: 07/07/2020 CLINICAL DATA:  Questionable sepsis.  Evaluate for abnormality. EXAM: PORTABLE CHEST 1 VIEW COMPARISON:  May 07, 2020 FINDINGS: Stable cardiomegaly. Mild atelectasis in the left base. No pneumothorax. The lungs are clear. No other abnormalities. IMPRESSION: No active disease.  Electronically Signed   By: Gerome Sam III M.D   On: 07/07/2020 15:53   ECHOCARDIOGRAM COMPLETE  Result Date: 07/11/2020    ECHOCARDIOGRAM REPORT   Patient Name:   TAESHAUN RAMES Date of Exam: 07/11/2020 Medical Rec #:  [id number]     Height:       67.0 in Accession #:    [id number]    Weight:       289.9 lb Date of Birth:  05/12/1946     BSA:          2.368 m Patient Age:    74 years      BP:           114/80 mmHg Patient Gender: M             HR:           125 bpm. Exam Location:  Inpatient Procedure: 2D Echo and Intracardiac Opacification Agent Indications:    CHF- Acute Diastolic I50.31  History:        Patient has prior history of Echocardiogram examinations, most                 recent 05/08/2020. Risk Factors:Hypertension and Dyslipidemia.  Sonographer:    Thurman Coyer RDCS (AE) Referring Phys: [id number] [place] General IMPRESSIONS  1. Left ventricular ejection fraction, by estimation, is 50 to 55%. The left ventricle has low normal function. The left ventricle has no regional wall motion abnormalities. There is moderate left ventricular hypertrophy. Left ventricular diastolic parameters are indeterminate.  2. Right ventricular systolic function is mildly reduced. The right ventricular size is mildly enlarged. There is mildly elevated pulmonary artery systolic pressure. The estimated right ventricular systolic pressure is 37.8 mmHg.  3. Right atrial size was mildly dilated.  4. The mitral valve is normal in structure. Trivial mitral valve regurgitation.  5. The aortic valve was not well visualized. Aortic valve regurgitation is not visualized. No aortic stenosis is present.  6. Aortic dilatation noted. There is mild dilatation of the ascending aorta, measuring 38 mm.  7. The inferior vena cava is dilated in size with <50% respiratory variability, suggesting right atrial pressure of 15 mmHg. FINDINGS  Left Ventricle: Left ventricular ejection fraction, by estimation, is 50 to 55%. The left ventricle has  low normal function. The left ventricle has no regional wall motion abnormalities. The left ventricular internal cavity size was normal in size. There is moderate left ventricular hypertrophy. Left ventricular diastolic parameters are indeterminate. Right Ventricle: The right ventricular size is mildly enlarged. Right vetricular wall thickness was not assessed. Right ventricular systolic function is mildly reduced. There is mildly elevated pulmonary artery systolic pressure. The tricuspid regurgitant velocity is 2.39  m/s, and with an assumed right atrial pressure of 15 mmHg, the estimated right ventricular systolic pressure is 37.8 mmHg. Left Atrium: Left atrial size was normal in size. Right Atrium: Right atrial size was mildly dilated. Pericardium: Trivial pericardial effusion is present. Presence of pericardial fat pad. Mitral Valve: The mitral valve is normal in structure. Trivial mitral valve regurgitation. Tricuspid Valve: The tricuspid valve is normal in structure. Tricuspid valve regurgitation is trivial. Aortic Valve: The aortic valve was not well visualized. Aortic valve regurgitation is not visualized. No aortic stenosis is present. Pulmonic Valve: The pulmonic valve was not well visualized. Pulmonic valve regurgitation is not visualized. Aorta: The aortic root is normal in size and structure and aortic dilatation noted. There is mild dilatation of the ascending aorta, measuring 38 mm. Venous: The inferior vena cava is dilated in size with less than 50% respiratory variability, suggesting right atrial pressure of 15 mmHg. IAS/Shunts: The interatrial septum was not well visualized.  LEFT VENTRICLE PLAX 2D LVIDd:         4.00 cm LVIDs:         2.80 cm LV PW:         1.40 cm LV IVS:        1.40 cm LVOT diam:     2.40 cm LV SV:         59 LV SV Index:   25 LVOT Area:     4.52 cm  RIGHT VENTRICLE TAPSE (M-mode): 1.3 cm LEFT ATRIUM             Index       RIGHT ATRIUM           Index LA diam:        4.70 cm  1.98 cm/m  RA Area:     24.90 cm LA Vol (A2C):   97.1 ml 41.01 ml/m RA Volume:   81.10 ml  34.25 ml/m LA Vol (A4C):   49.8 ml 21.03 ml/m LA Biplane Vol: 71.3 ml 30.11 ml/m  AORTIC VALVE LVOT Vmax:   71.80 cm/s LVOT Vmean:  47.800 cm/s LVOT VTI:    0.130 m  AORTA Ao Root diam: 3.50 cm Ao Asc diam:  3.80 cm TRICUSPID VALVE TR Peak grad:   22.8 mmHg TR Vmax:        239.00 cm/s  SHUNTS Systemic VTI:  0.13 m Systemic Diam: 2.40 cm Epifanio Lesches MD Electronically signed by Epifanio Lesches MD Signature Date/Time: 07/11/2020/4:44:06 PM    Final    ECHO TEE  Result Date: 07/13/2020    TRANSESOPHOGEAL ECHO REPORT   Patient Name:   FLAMUR SHEHEE Date of Exam: 07/13/2020 Medical Rec #:  [id number]     Height:       67.0 in Accession #:    [id number]    Weight:       283.0 lb Date of Birth:  1945-09-04     BSA:          2.344 m Patient Age:    74 years      BP:           110/83 mmHg Patient Gender: M             HR:           135 bpm. Exam Location:  Inpatient Procedure: Transesophageal Echo, Color Doppler and Cardiac Doppler Indications:     I48.91* Unspecified atrial fibrillation  History:         Patient has prior history  of Echocardiogram examinations, most                  recent 07/11/2020. Arrythmias:Atrial Fibrillation; Risk                  Factors:Hypertension, Dyslipidemia and COVID+ 05/2020.  Sonographer:     Raquel Sarna Senior RDCS Referring Phys:  [place] Diagnosing Phys: Jenkins Rouge MD PROCEDURE: After discussion of the risks and benefits of a TEE, an informed consent was obtained from the patient. The transesophogeal probe was passed without difficulty through the esophogus of the patient. Sedation performed by different physician. The patient was monitored while under deep sedation. Anesthestetic sedation was provided intravenously by Anesthesiology: 472mg  of Propofol. The patient developed no complications during the procedure. A successful direct current cardioversion was performed  at 200 joules with 1 attempt. IMPRESSIONS  1. TEE with no RAA/LAA thrombus DCC x 1 with 200J converted from atrial flutter rate 133 bpm to NSR rate 88 bpm On eliquis with no immediate neurologic sequelae.  2. EF decreased with patient in rapid atrial flutter rate 133. Left ventricular ejection fraction, by estimation, is 45 to 50%. The left ventricle has mildly decreased function. There is mild left ventricular hypertrophy.  3. Right ventricular systolic function is severely reduced. The right ventricular size is severely enlarged.  4. Left atrial size was moderately dilated. No left atrial/left atrial appendage thrombus was detected.  5. Right atrial size was severely dilated.  6. The mitral valve is normal in structure. Mild mitral valve regurgitation.  7. Tricuspid valve regurgitation is moderate.  8. The aortic valve is tricuspid. Aortic valve regurgitation is mild. Mild aortic valve sclerosis is present, with no evidence of aortic valve stenosis. FINDINGS  Left Ventricle: EF decreased with patient in rapid atrial flutter rate 133. Left ventricular ejection fraction, by estimation, is 45 to 50%. The left ventricle has mildly decreased function. The left ventricular internal cavity size was normal in size. There is mild left ventricular hypertrophy. Right Ventricle: The right ventricular size is severely enlarged. Right vetricular wall thickness was not assessed. Right ventricular systolic function is severely reduced. Left Atrium: Left atrial size was moderately dilated. No left atrial/left atrial appendage thrombus was detected. Right Atrium: Right atrial size was severely dilated. Pericardium: There is no evidence of pericardial effusion. Mitral Valve: The mitral valve is normal in structure. Mild mitral valve regurgitation. Tricuspid Valve: The tricuspid valve is normal in structure. Tricuspid valve regurgitation is moderate. Aortic Valve: The aortic valve is tricuspid. Aortic valve regurgitation is mild.  Mild aortic valve sclerosis is present, with no evidence of aortic valve stenosis. Pulmonic Valve: The pulmonic valve was normal in structure. Pulmonic valve regurgitation is mild. Aorta: The aortic root is normal in size and structure. IAS/Shunts: No atrial level shunt detected by color flow Doppler. Additional Comments: TEE with no RAA/LAA thrombus DCC x 1 with 200J converted from atrial flutter rate 133 bpm to NSR rate 88 bpm On eliquis with no immediate neurologic sequelae. Jenkins Rouge MD Electronically signed by Jenkins Rouge MD Signature Date/Time: 07/13/2020/12:39:55 PM    Final    US Abdomen Limited RUQ (LIVER/GB)  Result Date: 07/08/2020 CLINICAL DATA:  Fatty liver.  Morbid obesity. EXAM: ULTRASOUND ABDOMEN LIMITED RIGHT UPPER QUADRANT COMPARISON:  05/14/2015 FINDINGS: Gallbladder: Tiny gallstones evident measuring up to 10 mm. Gallbladder is incompletely distended with wall thickness measuring 7-8 mm. Common bile duct: Diameter: 4-5 mm Liver: Echogenic liver parenchyma suggests fatty deposition. Portal vein  is patent on color Doppler imaging with normal direction of blood flow towards the liver. Other: None. IMPRESSION: 1. Cholelithiasis with gallbladder wall thickening. Gallbladder is not fully distended which could accentuate wall thickness. If clinical picture is equivocal for acute cholecystitis, nuclear scintigraphy may prove helpful to further evaluate. Electronically Signed   By: Misty Stanley M.D.   On: 07/08/2020 09:50   Disposition   Pt is being discharged home today in good condition.  Follow-up Plans & Appointments   Heart Healthy Diet  Low salt diet Weigh every morning and if weight climbs by more than 3 pounds in a day or 5 pounds in a week please call to let us know so we can adjust meds.    Do not miss any Eliquis  We added amiodarone 200 mg take 2 tabs twice a day for 14 days then begininning 08/19/19 decrease to 1 tab once a day.     Call if symptoms increase or weight  increases.       Follow-up Information    [name], [name], MD Follow up.   Specialty: Cardiology Why: the office should call you on 08/07/19 to schedule an appt if you have not heard by 08/08/19 then please call the office.   Contact information: [address] [place] [address] [phone]                Discharge Medications   Allergies as of 08/03/2020   No Known Allergies     Medication List    TAKE these medications   amiodarone 200 MG tablet Commonly known as: PACERONE Take 2 tablets (400 mg total) by mouth 2 (two) times daily for 14 days. Then change to 200 mg once a day   apixaban 5 MG Tabs tablet Commonly known as: Eliquis Take 1 tablet (5 mg total) by mouth 2 (two) times daily.   atorvastatin 40 MG tablet Commonly known as: Lipitor Take 1 tablet (40 mg total) by mouth daily.   cyanocobalamin 1000 MCG tablet Take 1 tablet (1,000 mcg total) by mouth daily.   ferrous sulfate 325 (65 FE) MG tablet Take 1 tablet (325 mg total) by mouth 2 (two) times daily with a meal.   furosemide 40 MG tablet Commonly known as: LASIX Take 2 tablets (80 mg total) by mouth 2 (two) times daily. What changed:   how much to take  when to take this   metoprolol tartrate 100 MG tablet Commonly known as: LOPRESSOR Take 1 tablet (100 mg total) by mouth 2 (two) times daily. What changed:   medication strength  how much to take   nitroGLYCERIN 0.4 MG SL tablet Commonly known as: NITROSTAT Place 1 tablet (0.4 mg total) under the tongue every 5 (five) minutes x 3 doses as needed for chest pain.   pantoprazole 40 MG tablet Commonly known as: Protonix Take 1 tablet (40 mg total) by mouth daily.   potassium chloride SA 20 MEQ tablet Commonly known as: KLOR-CON Take 1 tablet (20 mEq total) by mouth 3 (three) times daily.   triamcinolone 0.1 % Commonly known as: KENALOG Apply 1 application topically at bedtime. For legs          Outstanding  Labs/Studies   BMP   Duration of Discharge Encounter   Greater than 30 minutes including physician time.  Signed, Cecilie Kicks, NP 08/03/2020, 10:56 AM

## 2020-08-03 NOTE — Progress Notes (Signed)
Progress Note  Patient Name: Shane Diaz Date of Encounter: 08/03/2020  [facility] Cardiologist: Sanda Klein, MD   Subjective   Unsuccessful cardioversion yesterday.  Currently in atrial fibrillation with ventricular rates in the 90s during the night and low 100s while awake. He feels well and believes he is lost roughly 50% of the 40 pound weight gain that led to his hospitalization.  He denies any difficulty with his breathing. He is totally intent on going home today.  Inpatient Medications    Scheduled Meds: . apixaban  5 mg Oral BID  . atorvastatin  40 mg Oral Daily  . ferrous sulfate  325 mg Oral BID WC  . furosemide  80 mg Intravenous BID  . metoprolol tartrate  50 mg Oral Q6H  . pantoprazole  40 mg Oral Daily  . cyanocobalamin  1,000 mcg Oral Daily   Continuous Infusions: . amiodarone 30 mg/hr (08/03/20 0243)   PRN Meds: acetaminophen, nitroGLYCERIN, ondansetron (ZOFRAN) IV   Vital Signs    Vitals:   08/03/20 0450 08/03/20 0500 08/03/20 0745 08/03/20 0843  BP: 121/62  (P) 104/83 120/77  Pulse: 93  (!) (P) 109 95  Resp: 16  (P) 18 20  Temp: 98.2 F (36.8 C)  (P) 97.7 F (36.5 C)   TempSrc: Oral  (P) Oral   SpO2: 98%   97%  Weight:  130.7 kg    Height:        Intake/Output Summary (Last 24 hours) at 08/03/2020 0911 Last data filed at 08/03/2020 0519 Gross per 24 hour  Intake 1111.37 ml  Output 1750 ml  Net -638.63 ml   Last 3 Weights 08/03/2020 08/02/2020 08/02/2020  Weight (lbs) 288 lb 1.6 oz 289 lb 0.4 oz 289 lb  Weight (kg) 130.681 kg 131.1 kg 131.09 kg      Telemetry    Atrial fibrillation with borderline ventricular rate control- Personally Reviewed  ECG    Atrial fibrillation with RVR, right bundle branch block - Personally Reviewed  Physical Exam  Obese.  Looks quite comfortable. GEN: No acute distress.   Neck: No JVD Cardiac:  Irregular, no murmurs, rubs, or gallops.  Respiratory: Clear to auscultation bilaterally. GI:  Soft, nontender, non-distended  MS:  3+ heart pitting edema almost to the knees bilaterally, symmetrical ; No deformity. Neuro:  Nonfocal  Psych: Normal affect   Labs    High Sensitivity Troponin:   Recent Labs  Lab 07/07/20 1505 07/07/20 1832 08/01/20 0738  TROPONINIHS 14 17 12       Chemistry Recent Labs  Lab 08/01/20 0738 08/02/20 0149  NA 136 136  K 4.3 4.3  CL 100 94*  CO2 26 32  GLUCOSE 146* 116*  BUN 11 12  CREATININE 0.96 1.03  CALCIUM 9.5 9.3  GFRNONAA >60 >60  ANIONGAP 10 10     Hematology Recent Labs  Lab 08/01/20 0738 08/02/20 0149  WBC 11.6* 9.2  RBC 4.05* 3.94*  HGB 11.2* 11.2*  HCT 38.3* 36.3*  MCV 94.6 92.1  MCH 27.7 28.4  MCHC 29.2* 30.9  RDW 17.2* 17.3*  PLT 278 254    BNP Recent Labs  Lab 08/01/20 0738  BNP 432.5*     DDimer No results for input(s): DDIMER in the last 168 hours.   Radiology    No results found.  Cardiac Studies   TEE/cardioversion 07/13/2020   PROCEDURE: After discussion of the risks and benefits of a TEE, an  informed consent was obtained from the patient. The  transesophogeal probe  was passed without difficulty through the esophogus of the patient.  Sedation performed by different physician.  The patient was monitored while under deep sedation. Anesthestetic  sedation was provided intravenously by Anesthesiology: 472mg  of Propofol.  The patient developed no complications during the procedure. A successful  direct current cardioversion was  performed at 200 joules with 1 attempt.   IMPRESSIONS    1. TEE with no RAA/LAA thrombus DCC x 1 with 200J converted from atrial  flutter rate 133 bpm to NSR rate 88 bpm On eliquis with no immediate  neurologic sequelae.  2. EF decreased with patient in rapid atrial flutter rate 133. Left  ventricular ejection fraction, by estimation, is 45 to 50%. The left  ventricle has mildly decreased function. There is mild left ventricular  hypertrophy.  3. Right  ventricular systolic function is severely reduced. The right  ventricular size is severely enlarged.  4. Left atrial size was moderately dilated. No left atrial/left atrial  appendage thrombus was detected.  5. Right atrial size was severely dilated.  6. The mitral valve is normal in structure. Mild mitral valve  regurgitation.  7. Tricuspid valve regurgitation is moderate.  8. The aortic valve is tricuspid. Aortic valve regurgitation is mild.  Mild aortic valve sclerosis is present, with no evidence of aortic valve  stenosis.   Cardioversion 08/02/2020   The patient was NPO after midnight. Anesthesia was administered at the beside  by Dr.Finucane with 60 mg of lidocaine and 50  mg propofol.  Cardioversion was done with synchronized biphasic defibrillation with AP pads with 200 Joules- two attempts were made.  The patient converted to failed to return to normal rhythm. The patient tolerated the procedure well   IMPRESSION:  Unsuccessful cardioversion of atrial fibrillation  Patient Profile     74 y.o. 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 CHF: Net negative 2.5 L since hospitalization, still estimated to be roughly 20 pounds above dry weight.  Switch to oral diuretics and potassium supplementation.  Needs early TOC follow-up in no more than 1-2 weeks.  Had a very long discussion regarding daily weights, sodium restriction (he makes his own sausage and pickles his own sauerkraut).  Will need repeat bmet at early follow-up. 2. AFib: Much easier to rate control than his previous atrial flutter, but still in RVR.  Switch to  oral amiodarone.  Dose of beta-blocker has been doubled.  He will remain vulnerable to heart failure exacerbation due to this over the next couple of weeks.  Reattempt cardioversion after at least another 30 days of oral amiodarone loading.  Reinforced the critical importance of compliance with Eliquis twice daily. 3. OSA: unless sleep apnea is consistently treated heart failure exacerbation will only continue to increase in frequency and severity.  Would be highly beneficial. 4.  CT coronary calcification: He has not experienced angina pectoris even with extreme RVR.  He has normal left ventricular systolic function.  Further evaluation for possible coronary disease can be delayed at this time. 5.  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  Another indication for lifelong anticoagulation.  Keep legs elevated  as much as possible.  Wear compression stockings when unable to do so.   For questions or updates, please contact [place] Please consult [email or web address] for contact info under        Signed, Sanda Klein, MD  08/03/2020, 9:11 AM

## 2020-08-18 NOTE — Progress Notes (Signed)
Cardiology Office Note:    Date:  08/22/2020   ID:  Shane Diaz, DOB 01/27/1946, MRN [id number]  PCP:  [name], MD  Cardiologist:  Sanda Klein, MD  Electrophysiologist:  None   Referring MD: [name], MD   Chief Complaint: hospital follow-up for CHF and atrial fibrillation/flutter  History of Present Illness:    Shane Diaz is a 75 y.o. male with a history of chronic diastolic CHF, atrial fibrillation/flutter s/p multiple recent DCCVs on Amiodarone and Eliquis, recurrent DVTs, chronic superficial venous insufficiency with ultrasound demonstrated reflux, varicose veins s/p ablation, hypertension, hyperlipidemia, obstructive, sleep apnea, morbid obesity, and fatty liver who is followed by Dr. Sallyanne Kuster and presents today for hospital follow-up for CHF and atrial fibrillation/flutter.  Patient was diagnosed with a right distal femoral and popliteal DVT in 2015. Follow-up ultrasound in 2016 showed partial it was partially resolved but he had chronic occlusion tibioperoneal venous trunk. He then started having a lot of lower extremity pain related to large varicose veins and underwent venous ablation of right lower extremity. He underwent laser ablation of left great saphenous vein and stab phlebectomies of the left leg in 06/2015. In 03/2019, he was diagnosed with DVT of left lower extremity and was started on Xarelto. There was concern of a reaction to the Xarelto (he developed vesicular bumps on calf); therefore, he was switched to Eliquis. Patient was first referred to Dr. Sallyanne Kuster in 05/2019 for further evaluation of lower extremity edema. At that visit, it sounded like he was only taking Eliquis 5mg  once daily. He continued to report severe swelling in his left lower extremity. He had tried compression stockings but stopped using them because they caused a lot of itching. He was noted to have severe bilateral lower extremity edema that was substantially worse on the left side.  However, this was felt to mostly be chronic in nature. Felt to be worse on the left due to previous ablation of superficial venous system. Dr. Sallyanne Kuster recommended long-term anticoagulation. Patient advised to keep legs elevated as much as possible and to wear compression stockings when not able to do this. Of note, he also follows with Dr. Scot Dock (Vascular Surgery). He also had multiple signs and symptoms of sleep apnea so split night sleep study was ordered. Patient was last seen by Dr. Sallyanne Kuster in 08/2019 at which time he continued to have lower extremity edema worse on the left. He did report substantial improvement in his edema if he kept his legs elevated for long periods of time. He also noted that compression stockings helped. Conservative management was again recommended. He was advised to keep follow-up with Dr. Scot Dock but it was not felt like there would be any radical solution to his problem.   Patient has recently had multiple hospitalization over the last couple of months. He was admitted in 05/2020 with COVID-19 pneumonia. Treated with steroids and Remdesivir. He did develop paroxysmal atrial fibrillation with this but converted back to sinus rhythm after being placed on IV Cardizem. High-sensitivity troponin mildly elevated and flat in the low 100's consistent with demand ischemia.   He was admitted from 07/07/2020 to 07/15/2020 for acute hypoxic respiratory failure secondary to acute diastolic CHF. He was found to be in new onset atrial flutter on admission. He was also found to have acute anemia with positive hemoccult on admission. Echo showed LVEF of 50-55% on with normal wall motion and moderate LVH. RV was mildly enlarged with mildly reduced systolic function and mildly elevated PASP of  37.8 mmHg. He was diuresed with IV Lasix and then transitioned to PO. GI was consulted for his GI bleed. He underwent EGD and colonoscopy. EGD showed mild gastritis and colonoscopy showed moderate  diverticulosis and internal hemorrhoid but no evidence of bleeding. He was started on Protonix and Iron. In regards to his atrial flutter, he was started on Amiodarone, Lopressor, and Diltiazem  but rates remained elevated. After confirming no active bleeding on on EGD/colonscopy, Eliquis was restarted and patient underwent successful TEE/DCCV on 07/13/2020. TEE showed LVEF of 45-50% and severely enlarged RV with severely reduced RV systolic function and moderate TR. Amiodarone and Diltiazem were able to be discontinued after cardioversion. He was discharged on Lasix, Lopressor, and Eliquis. Patient was also treated with IV antibiotics for bilateral lower extremity cellulitis during admission and was discharged on PO antibiotics.   Patient was readmitted form 08/01/2020 to 08/03/2020 for acute on chronic CHF and atrial fibrillation with RVR after presenting with 2 days of progrssive shortness of breath, orhtopnea, palpitations, chest pressure, dizziness, and severe fatigue. Rates as high as the 150's to 160's. Patient underwent DCCV x2 in the ED but reverted back to atrial fibrillation within a few seconds. He was started on IV Caridzem and IV Amiodarone and was diuresed with IV Lasix. Patient underwent DCCV again on 08/02/2020 but unfortunately this was again unsuccessful after 2 attempts. Decision was made to re-attempt cardioversion after a least 30 days of oral Amiodarone. Beta-blocker was also increased. Patient diursed 2.45 L and was switched to PO lasix. Discharge weight 287.5 lbs which is still about 20 lbs above his dry weight but patient denied any shortness of breath and was insistent on being discharged. The importance of low sodium diet was emphasized (patient makes his own sausage, pickles, and sauerkraut). Patient was discharged on Amiodarone 400mg  twice daily for 14 days and then decrease to 200mg  once daily, Lasix 80mg  twice daily, Lopressor 100mg  twice daily, and Eliquis 5mg  twice  daily.  Patient presents today for follow-up. Here with his wife. Patient weight is down 16 lbs from discharge. He denies any shortness of breath. He continues to have lower extremity edema. He states it waxes and wanes. He states it is currently worse than baseline but much better than it was prior to most recent admission. He is not wearing compression stockings today and I suspect he does not consistently wear them at home. It also sounds like he is not restricting his sodium/fluid intake. He denies any orthopnea or PND but states he just has trouble sleeping through the night. He never went and had his sleep study. He denies chest pain. He is currently in rate controlled atrial fibrillation but denies any palpitations. Rates in the 80's today; however, he states sometimes heart rates are in the 120's to 130's at home. No dizziness or syncope. No abnormal bleeding on Eliquis.   Past Medical History:  Diagnosis Date  . Acute diastolic heart failure ([place]) 08/03/2020  . Arthritis   . Diverticulosis 05/2015   Mild, noted on colonosocpy  . Fatty liver 05/14/2015   noted on [place] ABD  . History of colon polyps 05/2014   sessile in ascending colon, pedunculated in sigmoid colon, diverticulosis  . Hyperlipidemia    pt denies  . Hypertension    not currently taking medications per MD  . OSA (obstructive sleep apnea) 08/03/2020  . 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 Umbilical hernia   . Varicose veins  Past Surgical History:  Procedure Laterality Date  . APPENDECTOMY     childhood  . BIOPSY  07/11/2020   Procedure: BIOPSY;  Surgeon: Ladene Artist, MD;  Location: [facility];  Service: Endoscopy;;  . CARDIOVERSION N/A 07/13/2020   Procedure: CARDIOVERSION;  Surgeon: Josue Hector, MD;  Location: [facility] ENDOSCOPY;  Service: Cardiovascular;  Laterality: N/A;  . CARDIOVERSION N/A 08/02/2020   Procedure: CARDIOVERSION;  Surgeon: Werner Lean, MD;  Location: [facility]  ENDOSCOPY;  Service: Cardiovascular;  Laterality: N/A;  . COLONOSCOPY W/ POLYPECTOMY  05/23/2014  . COLONOSCOPY WITH PROPOFOL N/A 07/11/2020   Procedure: COLONOSCOPY WITH PROPOFOL;  Surgeon: Ladene Artist, MD;  Location: [facility] ENDOSCOPY;  Service: Endoscopy;  Laterality: N/A;  . ESOPHAGOGASTRODUODENOSCOPY (EGD) WITH PROPOFOL N/A 07/11/2020   Procedure: ESOPHAGOGASTRODUODENOSCOPY (EGD) WITH PROPOFOL;  Surgeon: Ladene Artist, MD;  Location: [facility] ENDOSCOPY;  Service: Endoscopy;  Laterality: N/A;  . TEE WITHOUT CARDIOVERSION N/A 07/13/2020   Procedure: TRANSESOPHAGEAL ECHOCARDIOGRAM (TEE);  Surgeon: Josue Hector, MD;  Location: [facility] ENDOSCOPY;  Service: Cardiovascular;  Laterality: N/A;  . UMBILICAL HERNIA REPAIR N/A 04/27/2018   Procedure: [place];  Surgeon: Alphonsa Overall, MD;  Location: [facility] ORS;  Service: General;  Laterality: N/A;  . varicose veins    . WRIST SURGERY      Current Medications: Current Meds  Medication Sig  . amiodarone (PACERONE) 200 MG tablet Take 200 mg by mouth daily.  Marland Kitchen apixaban (ELIQUIS) 5 MG TABS tablet Take 1 tablet (5 mg total) by mouth 2 (two) times daily.  Marland Kitchen atorvastatin (LIPITOR) 40 MG tablet Take 1 tablet (40 mg total) by mouth daily.  . ferrous sulfate 325 (65 FE) MG tablet Take 1 tablet (325 mg total) by mouth 2 (two) times daily with a meal.  . furosemide (LASIX) 40 MG tablet Take 40 mg by mouth 2 (two) times daily.  . metoprolol tartrate (LOPRESSOR) 100 MG tablet Take 1 tablet (100 mg total) by mouth 2 (two) times daily.  . nitroGLYCERIN (NITROSTAT) 0.4 MG SL tablet Place 1 tablet (0.4 mg total) under the tongue every 5 (five) minutes x 3 doses as needed for chest pain.  . pantoprazole (PROTONIX) 40 MG tablet Take 1 tablet (40 mg total) by mouth daily.  . potassium chloride SA (KLOR-CON) 20 MEQ tablet Take 20 mEq by mouth 2 (two) times daily.     Allergies:   Patient has no known allergies.   Social History   Socioeconomic  History  . Marital status: Married    Spouse name: Not on file  . Number of children: 0  . Years of education: Not on file  . Highest education level: Not on file  Occupational History  . Not on file  Tobacco Use  . Smoking status: Never Smoker  . Smokeless tobacco: Never Used  Vaping Use  . Vaping Use: Never used  Substance and Sexual Activity  . Alcohol use: Yes    Alcohol/week: 0.0 standard drinks    Comment: occassional wine, martini  . Drug use: No  . Sexual activity: Not on file  Other Topics Concern  . Not on file  Social History Narrative  . Not on file   Social Determinants of Health   Financial Resource Strain: Not on file  Food Insecurity: Not on file  Transportation Needs: Not on file  Physical Activity: Not on file  Stress: Not on file  Social Connections: Not on file     Family History: The  patient's family history is negative for Colon cancer, Esophageal cancer, Pancreatic cancer, Prostate cancer, Rectal cancer, and Stomach cancer.  ROS:   Please see the history of present illness.     EKGs/Labs/Other Studies Reviewed:    The following studies were reviewed today:  TTE 07/11/2020: Impressions: 1. Left ventricular ejection fraction, by estimation, is 50 to 55%. The  left ventricle has low normal function. The left ventricle has no regional  wall motion abnormalities. There is moderate left ventricular hypertrophy.  Left ventricular diastolic  parameters are indeterminate.  2. Right ventricular systolic function is mildly reduced. The right  ventricular size is mildly enlarged. There is mildly elevated pulmonary  artery systolic pressure. The estimated right ventricular systolic  pressure is [id number].  3. Right atrial size was mildly dilated.  4. The mitral valve is normal in structure. Trivial mitral valve  regurgitation.  5. The aortic valve was not well visualized. Aortic valve regurgitation  is not visualized. No aortic stenosis is  present.  6. Aortic dilatation noted. There is mild dilatation of the ascending  aorta, measuring 38 mm.  7. The inferior vena cava is dilated in size with <50% respiratory  variability, suggesting right atrial pressure of 15 mmHg.  _______________  TEE 07/13/2020: Impressions: 1. TEE with no RAA/LAA thrombus DCC x 1 with 200J converted from atrial  flutter rate 133 bpm to NSR rate 88 bpm On eliquis with no immediate  neurologic sequelae.  2. EF decreased with patient in rapid atrial flutter rate 133. Left  ventricular ejection fraction, by estimation, is 45 to 50%. The left  ventricle has mildly decreased function. There is mild left ventricular  hypertrophy.  3. Right ventricular systolic function is severely reduced. The right  ventricular size is severely enlarged.  4. Left atrial size was moderately dilated. No left atrial/left atrial  appendage thrombus was detected.  5. Right atrial size was severely dilated.  6. The mitral valve is normal in structure. Mild mitral valve  regurgitation.  7. Tricuspid valve regurgitation is moderate.  8. The aortic valve is tricuspid. Aortic valve regurgitation is mild.  Mild aortic valve sclerosis is present, with no evidence of aortic valve  stenosis.    EKG:  EKG ordered today. EKG personally reviewed and demonstrates atrial fibrillation, rate 87 bpm, with known RBBB. No significant changes from prior tracings.  Recent Labs: 05/08/2020: TSH 3.690 07/07/2020: ALT 18 07/14/2020: Magnesium 1.8 08/01/2020: B Natriuretic Peptide 432.5 08/02/2020: BUN 12; Creatinine, Ser 1.03; Hemoglobin 11.2; Platelets 254; Potassium 4.3; Sodium 136  Recent Lipid Panel    Component Value Date/Time   CHOL 103 05/08/2020 0335   TRIG 71 05/08/2020 0335   HDL 27 (L) 05/08/2020 0335   CHOLHDL 3.8 05/08/2020 0335   VLDL 14 05/08/2020 0335   LDLCALC 62 05/08/2020 0335    Physical Exam:    Vital Signs: BP 100/64   Pulse 96   Ht 5\' 7"  (1.702 m)    Wt 271 lb 9.6 oz (123.2 kg)   SpO2 95%   BMI 42.54 kg/m     Wt Readings from Last 3 Encounters:  08/22/20 271 lb 9.6 oz (123.2 kg)  08/03/20 288 lb 1.6 oz (130.7 kg)  07/14/20 285 lb 4.8 oz (129.4 kg)     General: 75 y.o. obese Caucasian male in no acute distress. HEENT: Normocephalic and atraumatic. Sclera clear. EOMs intact. Neck: Supple. No carotid bruits. JVD difficult to assess due to body habitus. Heart: Irregularly irregular rhythm with normal  rate.  No murmurs, gallops, or rubs.  Lungs: No increased work of breathing. Clear to ausculation bilaterally. No wheezes, rhonchi, or rales.  Abdomen: Soft, non-distended, and non-tender to palpation.  Extremities: 2-3+ pitting edema of bilateral lower extremities (left > right). Hyperpigmentation of bilateral lower extremities consistent with chronic venous insufficiency.  Skin: Warm and dry. Neuro: Alert and oriented x3. No focal deficits. Psych: Normal affect. Responds appropriately.  Assessment:    1. Chronic diastolic CHF (congestive heart failure) (HCC)   2. Persistent atrial fibrillation ([place])   3. Atrial flutter, unspecified type ([place])   4. Recurrent acute deep vein thrombosis (DVT) of lower extremity, unspecified laterality (HCC)   5. Varicose veins of lower extremity with edema, unspecified laterality   6. Chronic venous insufficiency   7. Primary hypertension   8. Hyperlipidemia, unspecified hyperlipidemia type   9. Obstructive sleep apnea     Plan:    Chronic Diastolic CHF - Patient has had 2 recent admission with CHF.  - Echo in 07/2020 showed VEF of 50-55% on with normal wall motion and moderate LVH. RV was mildly enlarged with mildly reduced systolic function and mildly elevated PASP of 37.8 mmHg. - Chronic lower extremity edema but otherwise appears euvolemic. Weight down 16 lbs from discharge. - Continue Lasix 80mg  twice daily.  - Discussed importance of daily weights and sodium/fluid restrictions.  - Will  check BMET and Magnesium today.  Persistent Atrial Fibrillation/Flutter - S/p successful DCCV on 07/13/2020 for atrial flutter. Presented back to hospital in atrial fibrillation on 08/01/2020. Underwent DCCV x2 in the ED and then another 2 attempts the next day after being started on IV Amiodarone. All unsuccessful.  - Remains in atrial fibrillation today.  - Continue Amiodarone 200mg  daily.  - Continue Lopressor 100mg  twice daily. - Rates well controlled today; however, patient states they can be elevated at times at home in the 120's to 130's. Given soft BP today, will continue current dose of Lopressor. Asked patient to continue to monitor BP/HR daily at home and let us know if heart rates  sustaining >100 bpm.  - Continue Eliquis 5mg  twice daily. Patient states he has been compliant with this and has not missed any doses since discharge. Tolerating this well with no abnormal bleeding.  - Discussed re-attempting DCCV after 1 month of PO Amiodarone. However, patient declined at this time. I tried to explain the procedure to him and explain that he had this procedure multiple times during recent admission. However, he states he would like to think about it. Will arrange follow-up with Dr. Sallyanne Kuster in 1-2 months so that we can follow back up on this and make sure rates are controlled. - Will check CBC today.  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 - At last visit with Dr. Scot Dock in 10/2019, it was safe to stop anticoagulation. However, now given recent diagnosis of atrial fibrillation/flutter, he will be on this indefinitely now as long as no significant bleeding issues.  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 He states it is currently worse than baseline but significant improved from where it was prior to recent admission. Given weight loss, I think this is more due to his chronic venous insufficiency  rather than CHF. - Discussed importance of elevating legs as much as possible and wearing compression stockings when not able to do this.  - Also discussed importance of sodium fluid/restriction. Suspect patient has not been adherent with this. - Followed by Dr. Scot Dock.    Hypertension - BP soft but stable today at 100/64. Patient states usually in the 120's/70's at home. - Continue Lopressor as above.  Hyperlipidemia - Lipid panel from 05/2020: Total Cholesterol 103, Triglycerides 71, HDL 27, LDL 62. - Continue Lipitor 40mg  daily.   Suspected Obstructive Sleep Apnea - Patient has suspected sleep apnea given prior reports of heavy snoring and nocturnal apnea as well as significant daytime somnolence. He was noted to have RV dysfunction on TEE in 07/2020.  - Split night sleep study previously ordered but patient never had this done. I discussed the importance of treating his presumed sleep apnea as untreated sleep apnea can lead to CHF exacerbations and difficult to control atrial fibrillation. Patient agreeable to sleep study so I will reorder this.   Disposition: Follow up in 1-2 months with Dr. Sallyanne Kuster.   Medication Adjustments/Labs and Tests Ordered: Current medicines are reviewed at length with the patient today.  Concerns regarding medicines are outlined above.  Orders Placed This Encounter  Procedures  . Basic metabolic panel  . CBC  . Magnesium  . Split night study   No orders of the defined types were placed in this encounter.   Patient Instructions   Medication Instructions:  No changes  *If you need a refill on your cardiac medications before your next appointment, please call your pharmacy*   Lab  Work: Today - labs BMP MAGNESIUM CBC If you have labs (blood work) drawn today and your tests are completely normal, you will receive your results only by: Marland Kitchen MyChart Message (if you have MyChart) OR . A paper copy in the mail If you have any lab test that is abnormal or we need to change your treatment, we will call you to review the results.  Other Instructions   -- keep feet elevated- use  Compression socks or hose, or you can use ace wraps only during the day take off at bedtime  --- weigh yourself daily -- same time each day- contact office if you gain more than 3 lbs over night or 5 lbs in in 5 days    monitor salt intake - no more than  2 gm total  in a day   Fluid intake of no more than 2 liters in a day of fluids -  Water or any type drinks  Testing/Procedures: Once we obtain your insurance authorization - [facility].  depending on Insurance it will be Split night study or at home  Sleep study - Your physician has recommended that you have a sleep study. This test records several body functions during sleep, including: brain activity, eye movement, oxygen and carbon dioxide blood levels, heart rate and rhythm, breathing rate and rhythm, the flow of air through your mouth and nose, snoring, body muscle movements, and chest and belly movement.     Follow-Up: At [facility], you and your health needs are our priority.  As part of our continuing mission to provide you with exceptional heart care, we have created designated Provider Care Teams.  These Care Teams include your primary Cardiologist (physician) and Advanced Practice Providers (APPs -  Physician Assistants and Nurse Practitioners) who all work together to provide you with the care you need,  when you need it.     Your next appointment:   1 to 2  month(s)  The format for your next appointment:   In Person  Provider:   Sanda Klein, MD     Electrical Cardioversion Electrical cardioversion is  the delivery of a jolt of electricity to restore a normal rhythm to the heart. A rhythm that is too fast or is not regular keeps the heart from pumping well. In this procedure, sticky patches or metal paddles are placed on the chest to deliver electricity to the heart from a device. This procedure may be done in an emergency if:  There is low or no blood pressure as a result of the heart rhythm.  Normal rhythm must be restored as fast as possible to protect the brain and heart from further damage.  It may save a life. This may also be a scheduled procedure for irregular or fast heart rhythms that are not immediately life-threatening. Tell a health care provider about:  Any allergies you have.  All medicines you are taking, including vitamins, herbs, eye drops, creams, and over-the-counter medicines.  Any problems you or family members have had with anesthetic medicines.  Any blood disorders you have.  Any surgeries you have had.  Any medical conditions you have.  Whether you are pregnant or may be pregnant. What are the risks? Generally, this is a safe procedure. However, problems may occur, including:  Allergic reactions to medicines.  A blood clot that breaks free and travels to other parts of your body.  The possible return of an abnormal heart rhythm within hours or days after the procedure.  Your heart stopping (cardiac arrest). This is rare. What happens before the procedure? Medicines  Your health care provider may have you start taking: ? Blood-thinning medicines (anticoagulants) so your blood does not clot as easily. ? Medicines to help stabilize your heart rate and rhythm.  Ask your health care provider about: ? Changing or stopping your regular medicines. This is especially important if you are taking diabetes medicines or blood thinners. ? Taking medicines such as aspirin and ibuprofen. These medicines can thin your blood. Do not take these medicines unless your  health care provider tells you to take them. ? Taking over-the-counter medicines, vitamins, herbs, and supplements. General instructions  Follow instructions from your health care provider about eating or drinking restrictions.  Plan to have someone take you home from the hospital or clinic.  If you will be going home right after the procedure, plan to have someone with you for 24 hours.  Ask your health care provider what steps will be taken to help prevent infection. These may include washing your skin with a germ-killing soap. What happens during the procedure?  An IV will be inserted into one of your veins.  Sticky patches (electrodes) or metal paddles may be placed on your chest.  You will be given a medicine to help you relax (sedative).  An electrical shock will be delivered. The procedure may vary among health care providers and hospitals.   What can I expect after the procedure?  Your blood pressure, heart rate, breathing rate, and blood oxygen level will be monitored until you leave the hospital or clinic.  Your heart rhythm will be watched to make sure it does not change.  You may have some redness on the skin where the shocks were given. Follow these instructions at home:  Do not drive for 24 hours if you were given  a sedative during your procedure.  Take over-the-counter and prescription medicines only as told by your health care provider.  Ask your health care provider how to check your pulse. Check it often.  Rest for 48 hours after the procedure or as told by your health care provider.  Avoid or limit your caffeine use as told by your health care provider.  Keep all follow-up visits as told by your health care provider. This is important. Contact a health care provider if:  You feel like your heart is beating too quickly or your pulse is not regular.  You have a serious muscle cramp that does not go away. Get help right away if:  You have discomfort in  your chest.  You are dizzy or you feel faint.  You have trouble breathing or you are short of breath.  Your speech is slurred.  You have trouble moving an arm or leg on one side of your body.  Your fingers or toes turn cold or blue. Summary  Electrical cardioversion is the delivery of a jolt of electricity to restore a normal rhythm to the heart.  This procedure may be done right away in an emergency or may be a scheduled procedure if the condition is not an emergency.  Generally, this is a safe procedure.  After the procedure, check your pulse often as told by your health care provider. This information is not intended to replace advice given to you by your health care provider. Make sure you discuss any questions you have with your health care provider. Document Revised: 02/21/2019 Document Reviewed: 02/21/2019 Elsevier Patient Education  2021 [place].          Fluid Restriction Fluid restriction means that a person needs to limit the amount of fluid he or she drinks each day due to certain health conditions. The amount of fluid that a person is allowed each day (fluid allowance) may depend on several things, such as:  Kidney function.  How much fluid the body is holding on to (retaining).  Blood pressure.  Heart function.  Blood sodium level. It is important to carefully measure and keep track of the amount of fluid that is consumed each day. What is my plan? Your health care provider recommends that you limit your fluid intake to ______2 liters____ per day. What counts toward my fluid intake? Your fluid intake includes all liquids that you drink and any foods that become liquid at room temperature. Examples of some fluids that you will have to limit include:  Tea, coffee, soda, lemonade, milk, water, juice, sports drinks, and nutritional supplement beverages.  Alcoholic beverages.  Cream.  Gravy.  Ice cubes.  Soup and broth. The following are  examples of foods that become liquid at room temperature. These foods will also count toward your fluid intake.  Ice cream and ice milk.  Frozen yogurt and sherbet.  Frozen ice pops.  Flavored gelatin. How do I keep track of my fluid intake? Each morning, fill a jug with the amount of water that is equal to your daily fluid allowance. You can use this water as a guideline for fluid allowance. Each time you take in any form of fluid (including ice cubes and foods that become liquid at room temperature), pour an equal amount of water out of the container. This helps you to see how much fluid you are consuming and how much more fluid you can take in during the rest of the day. The following conversions may  also be helpful in measuring your fluid intake:  1 cup equals 8 oz (240 mL).   cup equals 6 oz (180 mL).  ? cup equals 5? oz (160 mL).   cup equals 4 oz (120 mL).  ? cup equals 2? oz (80 mL).   cup equals 2 oz (60 mL).  2 Tbsp equals 1 oz (30 mL). What are tips for following this plan? General instructions  Make sure that you stay within your recommended fluid allowance each day. Always measure and keep track of your fluids, including ice cubes and foods that become liquid at room temperature.  Use small cups and glasses and learn to sip fluids slowly.  Try eating frozen fruits between meals, such as grapes or strawberries. These can satisfy thirst without adding to your fluid intake.  Swallow your pills with meals or soft foods, such as applesauce or mashed potatoes, instead of with liquids. Doing this helps you save your fluid allowance for something that you enjoy. Weigh yourself each day Weigh yourself every day. Keeping track of your daily weight can help you and your health care provider notice as soon as possible if your body is retaining fluid.  Follow this sequence every morning: 1. Urinate. 2. Weigh yourself. 3. Eat breakfast.  Wear the same amount of clothing  each time you weigh yourself.  Write down your daily weight. Give this weight record to your health care provider. If your weight is going up, you may be retaining too much fluid. Every 1 lb (0.45 kg) of body weight that you gain is a sign that your body is retaining 2 cups (480 mL) of fluid.      Manage your thirst  Add lemon juice or a slice of fresh lemon to water or ice. Doing this helps to satisfy your thirst.  Freeze fruit juice or water in an ice cube tray. Use this as part of your fluid allowance. These cubes are useful for quenching your thirst. Before you freeze the juice or water, measure how much liquid you use to fill a cube section of the ice tray. Subtract this amount from your day's allowance each time you consume a frozen cube.  Avoid salty, or high sodium, foods. These foods make you thirsty and make it more difficult to stay within your daily fluid allowance.  Keep the temperature in your home at a cooler level.  Keep the air in your home as humid as possible. Dry air increases thirst.  Avoid being out in the hot sun. This can cause you to sweat and become thirsty.  To help avoid dry mouth, brush your teeth often or rinse out your mouth with mouthwash. Lemon wedges, hard sour candies, chewing gum, or breath spray may also help to moisten your mouth. What are some signs that I may be taking in too much fluid? You may be taking in too much fluid if:  Your weight increases. Contact your health care provider if you gain weight rapidly.  Your face, hands, legs, feet, and abdomen start to swell.  You have trouble breathing. Summary  Fluid restriction means that a person needs to limit the amount of fluid he or she drinks each day due to certain health conditions. The amount of fluid that you are allowed each day may depend on kidney function and other factors.  It is important to carefully measure and keep track of the amount of fluid that you consume each day.  Your  fluid intake includes all  liquids that you drink, as well as any foods that become liquid at room temperature, such as ice cream, ice cubes, and gelatin.  You may be taking in too much fluid if your weight increases, your body starts to swell, or you have trouble breathing. This information is not intended to replace advice given to you by your health care provider. Make sure you discuss any questions you have with your health care provider. Document Revised: 05/08/2020 Document Reviewed: 05/08/2020 Elsevier Patient Education  2021 [place], Darreld Mclean, [place]  08/22/2020 1:59 PM    [facility]

## 2020-08-22 ENCOUNTER — Other Ambulatory Visit: Payer: Self-pay

## 2020-08-22 ENCOUNTER — Ambulatory Visit (INDEPENDENT_AMBULATORY_CARE_PROVIDER_SITE_OTHER): Payer: Medicare Other | Admitting: Student

## 2020-08-22 ENCOUNTER — Encounter: Payer: Self-pay | Admitting: Student

## 2020-08-22 VITALS — BP 100/64 | HR 96 | Ht 67.0 in | Wt 271.6 lb

## 2020-08-22 DIAGNOSIS — I4819 Other persistent atrial fibrillation: Secondary | ICD-10-CM | POA: Diagnosis not present

## 2020-08-22 DIAGNOSIS — I83899 Varicose veins of unspecified lower extremities with other complications: Secondary | ICD-10-CM

## 2020-08-22 DIAGNOSIS — I82409 Acute embolism and thrombosis of unspecified deep veins of unspecified lower extremity: Secondary | ICD-10-CM

## 2020-08-22 DIAGNOSIS — I10 Essential (primary) hypertension: Secondary | ICD-10-CM

## 2020-08-22 DIAGNOSIS — I5032 Chronic diastolic (congestive) heart failure: Secondary | ICD-10-CM | POA: Diagnosis not present

## 2020-08-22 DIAGNOSIS — G4733 Obstructive sleep apnea (adult) (pediatric): Secondary | ICD-10-CM

## 2020-08-22 DIAGNOSIS — I872 Venous insufficiency (chronic) (peripheral): Secondary | ICD-10-CM

## 2020-08-22 DIAGNOSIS — I4892 Unspecified atrial flutter: Secondary | ICD-10-CM

## 2020-08-22 DIAGNOSIS — E785 Hyperlipidemia, unspecified: Secondary | ICD-10-CM

## 2020-08-22 NOTE — Patient Instructions (Addendum)
Medication Instructions:  No changes  *If you need a refill on your cardiac medications before your next appointment, please call your pharmacy*   Lab Work: Today - labs BMP MAGNESIUM CBC If you have labs (blood work) drawn today and your tests are completely normal, you will receive your results only by: Marland Kitchen MyChart Message (if you have MyChart) OR . A paper copy in the mail If you have any lab test that is abnormal or we need to change your treatment, we will call you to review the results.  Other Instructions   -- keep feet elevated- use  Compression socks or hose, or you can use ace wraps only during the day take off at bedtime  --- weigh yourself daily -- same time each day- contact office if you gain more than 3 lbs over night or 5 lbs in in 5 days    monitor salt intake - no more than  2 gm total  in a day   Fluid intake of no more than 2 liters in a day of fluids -  Water or any type drinks  Testing/Procedures: Once we obtain your insurance authorization - [facility].  depending on Insurance it will be Split night study or at home  Sleep study - Your physician has recommended that you have a sleep study. This test records several body functions during sleep, including: brain activity, eye movement, oxygen and carbon dioxide blood levels, heart rate and rhythm, breathing rate and rhythm, the flow of air through your mouth and nose, snoring, body muscle movements, and chest and belly movement.     Follow-Up: At [facility], you and your health needs are our priority.  As part of our continuing mission to provide you with exceptional heart care, we have created designated Provider Care Teams.  These Care Teams include your primary Cardiologist (physician) and Advanced Practice Providers (APPs -  Physician Assistants and Nurse Practitioners) who all work together to provide you with the care you need, when you need it.     Your next appointment:   1 to 2   month(s)  The format for your next appointment:   In Person  Provider:   Sanda Klein, MD     Electrical Cardioversion Electrical cardioversion is the delivery of a jolt of electricity to restore a normal rhythm to the heart. A rhythm that is too fast or is not regular keeps the heart from pumping well. In this procedure, sticky patches or metal paddles are placed on the chest to deliver electricity to the heart from a device. This procedure may be done in an emergency if:  There is low or no blood pressure as a result of the heart rhythm.  Normal rhythm must be restored as fast as possible to protect the brain and heart from further damage.  It may save a life. This may also be a scheduled procedure for irregular or fast heart rhythms that are not immediately life-threatening. Tell a health care provider about:  Any allergies you have.  All medicines you are taking, including vitamins, herbs, eye drops, creams, and over-the-counter medicines.  Any problems you or family members have had with anesthetic medicines.  Any blood disorders you have.  Any surgeries you have had.  Any medical conditions you have.  Whether you are pregnant or may be pregnant. What are the risks? Generally, this is a safe procedure. However, problems may occur, including:  Allergic reactions to medicines.  A blood clot  that breaks free and travels to other parts of your body.  The possible return of an abnormal heart rhythm within hours or days after the procedure.  Your heart stopping (cardiac arrest). This is rare. What happens before the procedure? Medicines  Your health care provider may have you start taking: ? Blood-thinning medicines (anticoagulants) so your blood does not clot as easily. ? Medicines to help stabilize your heart rate and rhythm.  Ask your health care provider about: ? Changing or stopping your regular medicines. This is especially important if you are taking  diabetes medicines or blood thinners. ? Taking medicines such as aspirin and ibuprofen. These medicines can thin your blood. Do not take these medicines unless your health care provider tells you to take them. ? Taking over-the-counter medicines, vitamins, herbs, and supplements. General instructions  Follow instructions from your health care provider about eating or drinking restrictions.  Plan to have someone take you home from the hospital or clinic.  If you will be going home right after the procedure, plan to have someone with you for 24 hours.  Ask your health care provider what steps will be taken to help prevent infection. These may include washing your skin with a germ-killing soap. What happens during the procedure?  An IV will be inserted into one of your veins.  Sticky patches (electrodes) or metal paddles may be placed on your chest.  You will be given a medicine to help you relax (sedative).  An electrical shock will be delivered. The procedure may vary among health care providers and hospitals.   What can I expect after the procedure?  Your blood pressure, heart rate, breathing rate, and blood oxygen level will be monitored until you leave the hospital or clinic.  Your heart rhythm will be watched to make sure it does not change.  You may have some redness on the skin where the shocks were given. Follow these instructions at home:  Do not drive for 24 hours if you were given a sedative during your procedure.  Take over-the-counter and prescription medicines only as told by your health care provider.  Ask your health care provider how to check your pulse. Check it often.  Rest for 48 hours after the procedure or as told by your health care provider.  Avoid or limit your caffeine use as told by your health care provider.  Keep all follow-up visits as told by your health care provider. This is important. Contact a health care provider if:  You feel like your  heart is beating too quickly or your pulse is not regular.  You have a serious muscle cramp that does not go away. Get help right away if:  You have discomfort in your chest.  You are dizzy or you feel faint.  You have trouble breathing or you are short of breath.  Your speech is slurred.  You have trouble moving an arm or leg on one side of your body.  Your fingers or toes turn cold or blue. Summary  Electrical cardioversion is the delivery of a jolt of electricity to restore a normal rhythm to the heart.  This procedure may be done right away in an emergency or may be a scheduled procedure if the condition is not an emergency.  Generally, this is a safe procedure.  After the procedure, check your pulse often as told by your health care provider. This information is not intended to replace advice given to you by your health care provider.  Make sure you discuss any questions you have with your health care provider. Document Revised: 02/21/2019 Document Reviewed: 02/21/2019 Elsevier Patient Education  2021 [place].          Fluid Restriction Fluid restriction means that a person needs to limit the amount of fluid he or she drinks each day due to certain health conditions. The amount of fluid that a person is allowed each day (fluid allowance) may depend on several things, such as:  Kidney function.  How much fluid the body is holding on to (retaining).  Blood pressure.  Heart function.  Blood sodium level. It is important to carefully measure and keep track of the amount of fluid that is consumed each day. What is my plan? Your health care provider recommends that you limit your fluid intake to ______2 liters____ per day. What counts toward my fluid intake? Your fluid intake includes all liquids that you drink and any foods that become liquid at room temperature. Examples of some fluids that you will have to limit include:  Tea, coffee, soda, lemonade, milk,  water, juice, sports drinks, and nutritional supplement beverages.  Alcoholic beverages.  Cream.  Gravy.  Ice cubes.  Soup and broth. The following are examples of foods that become liquid at room temperature. These foods will also count toward your fluid intake.  Ice cream and ice milk.  Frozen yogurt and sherbet.  Frozen ice pops.  Flavored gelatin. How do I keep track of my fluid intake? Each morning, fill a jug with the amount of water that is equal to your daily fluid allowance. You can use this water as a guideline for fluid allowance. Each time you take in any form of fluid (including ice cubes and foods that become liquid at room temperature), pour an equal amount of water out of the container. This helps you to see how much fluid you are consuming and how much more fluid you can take in during the rest of the day. The following conversions may also be helpful in measuring your fluid intake:  1 cup equals 8 oz (240 mL).   cup equals 6 oz (180 mL).  ? cup equals 5? oz (160 mL).   cup equals 4 oz (120 mL).  ? cup equals 2? oz (80 mL).   cup equals 2 oz (60 mL).  2 Tbsp equals 1 oz (30 mL). What are tips for following this plan? General instructions  Make sure that you stay within your recommended fluid allowance each day. Always measure and keep track of your fluids, including ice cubes and foods that become liquid at room temperature.  Use small cups and glasses and learn to sip fluids slowly.  Try eating frozen fruits between meals, such as grapes or strawberries. These can satisfy thirst without adding to your fluid intake.  Swallow your pills with meals or soft foods, such as applesauce or mashed potatoes, instead of with liquids. Doing this helps you save your fluid allowance for something that you enjoy. Weigh yourself each day Weigh yourself every day. Keeping track of your daily weight can help you and your health care provider notice as soon as  possible if your body is retaining fluid.  Follow this sequence every morning: 1. Urinate. 2. Weigh yourself. 3. Eat breakfast.  Wear the same amount of clothing each time you weigh yourself.  Write down your daily weight. Give this weight record to your health care provider. If your weight is going up, you may be  retaining too much fluid. Every 1 lb (0.45 kg) of body weight that you gain is a sign that your body is retaining 2 cups (480 mL) of fluid.      Manage your thirst  Add lemon juice or a slice of fresh lemon to water or ice. Doing this helps to satisfy your thirst.  Freeze fruit juice or water in an ice cube tray. Use this as part of your fluid allowance. These cubes are useful for quenching your thirst. Before you freeze the juice or water, measure how much liquid you use to fill a cube section of the ice tray. Subtract this amount from your day's allowance each time you consume a frozen cube.  Avoid salty, or high sodium, foods. These foods make you thirsty and make it more difficult to stay within your daily fluid allowance.  Keep the temperature in your home at a cooler level.  Keep the air in your home as humid as possible. Dry air increases thirst.  Avoid being out in the hot sun. This can cause you to sweat and become thirsty.  To help avoid dry mouth, brush your teeth often or rinse out your mouth with mouthwash. Lemon wedges, hard sour candies, chewing gum, or breath spray may also help to moisten your mouth. What are some signs that I may be taking in too much fluid? You may be taking in too much fluid if:  Your weight increases. Contact your health care provider if you gain weight rapidly.  Your face, hands, legs, feet, and abdomen start to swell.  You have trouble breathing. Summary  Fluid restriction means that a person needs to limit the amount of fluid he or she drinks each day due to certain health conditions. The amount of fluid that you are allowed each  day may depend on kidney function and other factors.  It is important to carefully measure and keep track of the amount of fluid that you consume each day.  Your fluid intake includes all liquids that you drink, as well as any foods that become liquid at room temperature, such as ice cream, ice cubes, and gelatin.  You may be taking in too much fluid if your weight increases, your body starts to swell, or you have trouble breathing. This information is not intended to replace advice given to you by your health care provider. Make sure you discuss any questions you have with your health care provider. Document Revised: 05/08/2020 Document Reviewed: 05/08/2020 Elsevier Patient Education  2021 [employer].

## 2020-08-22 NOTE — Addendum Note (Signed)
Addended by: Raiford Simmonds on: 08/22/2020 02:14 PM   Modules accepted: Orders

## 2020-08-22 NOTE — Progress Notes (Signed)
We [place]ians have strong opinions

## 2020-08-24 LAB — CBC
Hematocrit: 41.5 % (ref 37.5–51.0)
Hemoglobin: 12.7 g/dL — ABNORMAL LOW (ref 13.0–17.7)
MCH: 27 pg (ref 26.6–33.0)
MCHC: 30.6 g/dL — ABNORMAL LOW (ref 31.5–35.7)
MCV: 88 fL (ref 79–97)
Platelets: 288 10*3/uL (ref 150–450)
RBC: 4.7 x10E6/uL (ref 4.14–5.80)
RDW: 15.6 % — ABNORMAL HIGH (ref 11.6–15.4)
WBC: 10.9 10*3/uL — ABNORMAL HIGH (ref 3.4–10.8)

## 2020-08-24 LAB — BASIC METABOLIC PANEL
BUN/Creatinine Ratio: 24 (ref 10–24)
BUN: 26 mg/dL (ref 8–27)
CO2: 33 mmol/L — ABNORMAL HIGH (ref 20–29)
Calcium: 9.9 mg/dL (ref 8.6–10.2)
Chloride: 100 mmol/L (ref 96–106)
Creatinine, Ser: 1.07 mg/dL (ref 0.76–1.27)
GFR calc Af Amer: 79 mL/min/{1.73_m2} (ref >59)
GFR calc non Af Amer: 68 mL/min/{1.73_m2} (ref >59)
Glucose: 99 mg/dL (ref 65–99)
Potassium: 5.1 mmol/L (ref 3.5–5.2)
Sodium: 144 mmol/L (ref 134–144)

## 2020-08-24 LAB — MAGNESIUM: Magnesium: 1.9 mg/dL (ref 1.6–2.3)

## 2020-09-24 ENCOUNTER — Other Ambulatory Visit: Payer: Self-pay

## 2020-09-24 ENCOUNTER — Emergency Department (HOSPITAL_COMMUNITY): Payer: Medicare Other

## 2020-09-24 ENCOUNTER — Inpatient Hospital Stay (HOSPITAL_COMMUNITY)
Admission: EM | Admit: 2020-09-24 | Discharge: 2020-10-02 | DRG: 871 | Disposition: A | Discharge status: Home Health | Payer: Medicare Other | Attending: Internal Medicine | Admitting: Internal Medicine

## 2020-09-24 ENCOUNTER — Encounter (HOSPITAL_COMMUNITY): Payer: Self-pay | Admitting: Emergency Medicine

## 2020-09-24 DIAGNOSIS — J189 Pneumonia, unspecified organism: Secondary | ICD-10-CM | POA: Diagnosis present

## 2020-09-24 DIAGNOSIS — Z8701 Personal history of pneumonia (recurrent): Secondary | ICD-10-CM

## 2020-09-24 DIAGNOSIS — I5033 Acute on chronic diastolic (congestive) heart failure: Secondary | ICD-10-CM | POA: Diagnosis present

## 2020-09-24 DIAGNOSIS — A419 Sepsis, unspecified organism: Secondary | ICD-10-CM | POA: Diagnosis present

## 2020-09-24 DIAGNOSIS — Z7901 Long term (current) use of anticoagulants: Secondary | ICD-10-CM | POA: Diagnosis not present

## 2020-09-24 DIAGNOSIS — I110 Hypertensive heart disease with heart failure: Secondary | ICD-10-CM | POA: Diagnosis present

## 2020-09-24 DIAGNOSIS — Z8616 Personal history of COVID-19: Secondary | ICD-10-CM | POA: Diagnosis not present

## 2020-09-24 DIAGNOSIS — E66813 Obesity, class 3: Secondary | ICD-10-CM | POA: Diagnosis present

## 2020-09-24 DIAGNOSIS — L03116 Cellulitis of left lower limb: Secondary | ICD-10-CM | POA: Diagnosis present

## 2020-09-24 DIAGNOSIS — M7989 Other specified soft tissue disorders: Secondary | ICD-10-CM | POA: Diagnosis not present

## 2020-09-24 DIAGNOSIS — E785 Hyperlipidemia, unspecified: Secondary | ICD-10-CM | POA: Diagnosis present

## 2020-09-24 DIAGNOSIS — Z86010 Personal history of colonic polyps: Secondary | ICD-10-CM | POA: Diagnosis not present

## 2020-09-24 DIAGNOSIS — G4733 Obstructive sleep apnea (adult) (pediatric): Secondary | ICD-10-CM | POA: Diagnosis present

## 2020-09-24 DIAGNOSIS — I4819 Other persistent atrial fibrillation: Secondary | ICD-10-CM | POA: Diagnosis present

## 2020-09-24 DIAGNOSIS — E872 Acidosis, unspecified: Secondary | ICD-10-CM | POA: Diagnosis present

## 2020-09-24 DIAGNOSIS — I480 Paroxysmal atrial fibrillation: Secondary | ICD-10-CM | POA: Diagnosis not present

## 2020-09-24 DIAGNOSIS — Z6841 Body Mass Index (BMI) 40.0 and over, adult: Secondary | ICD-10-CM | POA: Diagnosis not present

## 2020-09-24 DIAGNOSIS — Z86718 Personal history of other venous thrombosis and embolism: Secondary | ICD-10-CM

## 2020-09-24 DIAGNOSIS — R0602 Shortness of breath: Secondary | ICD-10-CM | POA: Diagnosis not present

## 2020-09-24 DIAGNOSIS — W010XXA Fall on same level from slipping, tripping and stumbling without subsequent striking against object, initial encounter: Secondary | ICD-10-CM | POA: Diagnosis present

## 2020-09-24 DIAGNOSIS — L03115 Cellulitis of right lower limb: Secondary | ICD-10-CM | POA: Diagnosis present

## 2020-09-24 DIAGNOSIS — Z20822 Contact with and (suspected) exposure to covid-19: Secondary | ICD-10-CM | POA: Diagnosis present

## 2020-09-24 DIAGNOSIS — I4891 Unspecified atrial fibrillation: Secondary | ICD-10-CM | POA: Diagnosis present

## 2020-09-24 DIAGNOSIS — R6520 Severe sepsis without septic shock: Secondary | ICD-10-CM | POA: Diagnosis present

## 2020-09-24 DIAGNOSIS — Z79899 Other long term (current) drug therapy: Secondary | ICD-10-CM

## 2020-09-24 DIAGNOSIS — Z9049 Acquired absence of other specified parts of digestive tract: Secondary | ICD-10-CM | POA: Diagnosis not present

## 2020-09-24 DIAGNOSIS — I10 Essential (primary) hypertension: Secondary | ICD-10-CM

## 2020-09-24 DIAGNOSIS — L03119 Cellulitis of unspecified part of limb: Secondary | ICD-10-CM

## 2020-09-24 DIAGNOSIS — R609 Edema, unspecified: Secondary | ICD-10-CM | POA: Diagnosis not present

## 2020-09-24 DIAGNOSIS — J9621 Acute and chronic respiratory failure with hypoxia: Secondary | ICD-10-CM | POA: Diagnosis present

## 2020-09-24 DIAGNOSIS — M1990 Unspecified osteoarthritis, unspecified site: Secondary | ICD-10-CM | POA: Diagnosis present

## 2020-09-24 DIAGNOSIS — I482 Chronic atrial fibrillation, unspecified: Secondary | ICD-10-CM | POA: Diagnosis present

## 2020-09-24 LAB — RESP PANEL BY RT-PCR (FLU A&B, COVID) ARPGX2
Influenza A by PCR: NEGATIVE
Influenza B by PCR: NEGATIVE
SARS Coronavirus 2 by RT PCR: NEGATIVE

## 2020-09-24 LAB — CBC WITH DIFFERENTIAL/PLATELET
Abs Immature Granulocytes: 0.16 10*3/uL — ABNORMAL HIGH (ref 0.00–0.07)
Basophils Absolute: 0 10*3/uL (ref 0.0–0.1)
Basophils Relative: 0 %
Eosinophils Absolute: 0.1 10*3/uL (ref 0.0–0.5)
Eosinophils Relative: 1 %
HCT: 41.6 % (ref 39.0–52.0)
Hemoglobin: 12.7 g/dL — ABNORMAL LOW (ref 13.0–17.0)
Immature Granulocytes: 1 %
Lymphocytes Relative: 10 %
Lymphs Abs: 1.7 10*3/uL (ref 0.7–4.0)
MCH: 27 pg (ref 26.0–34.0)
MCHC: 30.5 g/dL (ref 30.0–36.0)
MCV: 88.5 fL (ref 80.0–100.0)
Monocytes Absolute: 0.5 10*3/uL (ref 0.1–1.0)
Monocytes Relative: 3 %
Neutro Abs: 14.8 10*3/uL — ABNORMAL HIGH (ref 1.7–7.7)
Neutrophils Relative %: 85 %
Platelets: 294 10*3/uL (ref 150–400)
RBC: 4.7 MIL/uL (ref 4.22–5.81)
RDW: 16.8 % — ABNORMAL HIGH (ref 11.5–15.5)
WBC: 17.4 10*3/uL — ABNORMAL HIGH (ref 4.0–10.5)
nRBC: 0 % (ref 0.0–0.2)

## 2020-09-24 LAB — COMPREHENSIVE METABOLIC PANEL
ALT: 18 U/L (ref 0–44)
AST: 30 U/L (ref 15–41)
Albumin: 3.3 g/dL — ABNORMAL LOW (ref 3.5–5.0)
Alkaline Phosphatase: 104 U/L (ref 38–126)
Anion gap: 11 (ref 5–15)
BUN: 19 mg/dL (ref 8–23)
CO2: 24 mmol/L (ref 22–32)
Calcium: 9.5 mg/dL (ref 8.9–10.3)
Chloride: 101 mmol/L (ref 98–111)
Creatinine, Ser: 1.08 mg/dL (ref 0.61–1.24)
GFR, Estimated: >60 mL/min (ref >60)
Glucose, Bld: 149 mg/dL — ABNORMAL HIGH (ref 70–99)
Potassium: 3.6 mmol/L (ref 3.5–5.1)
Sodium: 136 mmol/L (ref 135–145)
Total Bilirubin: 0.4 mg/dL (ref 0.3–1.2)
Total Protein: 7.1 g/dL (ref 6.5–8.1)

## 2020-09-24 LAB — URINALYSIS, ROUTINE W REFLEX MICROSCOPIC
Bacteria, UA: NONE SEEN
Bilirubin Urine: NEGATIVE
Glucose, UA: NEGATIVE mg/dL
Ketones, ur: NEGATIVE mg/dL
Leukocytes,Ua: NEGATIVE
Nitrite: NEGATIVE
Protein, ur: NEGATIVE mg/dL
Specific Gravity, Urine: 1.014 (ref 1.005–1.030)
pH: 5 (ref 5.0–8.0)

## 2020-09-24 LAB — LACTIC ACID, PLASMA
Lactic Acid, Venous: 2.3 mmol/L (ref 0.5–1.9)
Lactic Acid, Venous: 2.9 mmol/L (ref 0.5–1.9)
Lactic Acid, Venous: 2.1 mmol/L (ref 0.5–1.9)

## 2020-09-24 LAB — PROTIME-INR
INR: 1.2 (ref 0.8–1.2)
Prothrombin Time: 14.6 seconds (ref 11.4–15.2)

## 2020-09-24 MED ORDER — FERROUS SULFATE 325 (65 FE) MG PO TABS
325.0000 mg | ORAL_TABLET | Freq: Every day | ORAL | Status: DC
  Administered 2020-09-25 – 2020-10-02 (×8): 325 mg via ORAL
  Filled 2020-09-24 (×8): qty 1

## 2020-09-24 MED ORDER — LACTATED RINGERS IV BOLUS (SEPSIS)
1000.0000 mL | Freq: Once | INTRAVENOUS | Status: AC
  Administered 2020-09-24: 1000 mL via INTRAVENOUS

## 2020-09-24 MED ORDER — ACETAMINOPHEN 650 MG RE SUPP: 650.0000 mg | Freq: Four times a day (QID) | RECTAL | Status: DC | PRN

## 2020-09-24 MED ORDER — ACETAMINOPHEN 325 MG PO TABS
650.0000 mg | ORAL_TABLET | Freq: Once | ORAL | Status: AC
  Administered 2020-09-24: 650 mg via ORAL
  Filled 2020-09-24: qty 2

## 2020-09-24 MED ORDER — AMIODARONE HCL 200 MG PO TABS
200.0000 mg | ORAL_TABLET | Freq: Every day | ORAL | Status: DC
  Administered 2020-09-25 – 2020-10-02 (×8): 200 mg via ORAL
  Filled 2020-09-24 (×9): qty 1

## 2020-09-24 MED ORDER — ACETAMINOPHEN 325 MG PO TABS
650.0000 mg | ORAL_TABLET | Freq: Four times a day (QID) | ORAL | Status: DC | PRN
  Administered 2020-09-28 – 2020-09-29 (×2): 650 mg via ORAL
  Filled 2020-09-24 (×2): qty 2

## 2020-09-24 MED ORDER — SODIUM CHLORIDE 0.9 % IV SOLN
500.0000 mg | INTRAVENOUS | Status: DC
  Administered 2020-09-24: 500 mg via INTRAVENOUS
  Filled 2020-09-24: qty 500

## 2020-09-24 MED ORDER — POLYETHYLENE GLYCOL 3350 17 G PO PACK
17.0000 g | PACK | Freq: Every day | ORAL | Status: DC | PRN
Start: 2020-09-24 — End: 2020-10-02

## 2020-09-24 MED ORDER — METOPROLOL TARTRATE 50 MG PO TABS
50.0000 mg | ORAL_TABLET | Freq: Two times a day (BID) | ORAL | Status: DC
  Administered 2020-09-25 – 2020-10-02 (×15): 50 mg via ORAL
  Filled 2020-09-24 (×8): qty 1
  Filled 2020-09-24: qty 2
  Filled 2020-09-24 (×7): qty 1

## 2020-09-24 MED ORDER — SODIUM CHLORIDE 0.9 % IV SOLN
2.0000 g | INTRAVENOUS | Status: DC
  Administered 2020-09-24: 2 g via INTRAVENOUS
  Filled 2020-09-24: qty 20

## 2020-09-24 MED ORDER — LACTATED RINGERS IV SOLN: INTRAVENOUS | Status: DC

## 2020-09-24 MED ORDER — ALBUTEROL SULFATE (2.5 MG/3ML) 0.083% IN NEBU: 2.5000 mg | INHALATION_SOLUTION | RESPIRATORY_TRACT | Status: DC | PRN

## 2020-09-24 MED ORDER — APIXABAN 5 MG PO TABS
5.0000 mg | ORAL_TABLET | Freq: Two times a day (BID) | ORAL | Status: DC
  Administered 2020-09-24 – 2020-10-02 (×16): 5 mg via ORAL
  Filled 2020-09-24 (×17): qty 1

## 2020-09-24 MED ORDER — ONDANSETRON HCL 4 MG PO TABS
4.0000 mg | ORAL_TABLET | Freq: Four times a day (QID) | ORAL | Status: DC | PRN
  Filled 2020-09-24 (×2): qty 1

## 2020-09-24 MED ORDER — SODIUM CHLORIDE 0.9 % IV SOLN
2.0000 g | INTRAVENOUS | Status: AC
  Administered 2020-09-25 – 2020-10-01 (×7): 2 g via INTRAVENOUS
  Filled 2020-09-24: qty 2
  Filled 2020-09-24 (×6): qty 20

## 2020-09-24 MED ORDER — SODIUM CHLORIDE 0.9 % IV SOLN
500.0000 mg | INTRAVENOUS | Status: DC
  Administered 2020-09-25 – 2020-09-28 (×4): 500 mg via INTRAVENOUS
  Filled 2020-09-24 (×5): qty 500

## 2020-09-24 MED ORDER — ONDANSETRON HCL 4 MG/2ML IJ SOLN: 4.0000 mg | Freq: Four times a day (QID) | INTRAMUSCULAR | Status: DC | PRN

## 2020-09-24 NOTE — H&P (Signed)
History and Physical    Shane Diaz WGN:[id number] DOB: 09-Oct-1945 DOA: 09/24/2020  PCP: [name], MD   Patient coming from: Home  I have personally briefly reviewed patient's old medical records in [place]  Chief Complaint: Generalized weakness, hypotension  HPI: Shane Diaz is a 75 y.o. male with medical history significant of hypertension, hyperlipidemia, obstructive sleep apnea, paroxysmal atrial fibrillation, chronic diastolic congestive heart failure, obesity BMI who came to hospital for evaluation shortness of breath. Patient stated she has been feeling ill for about 7 days.  Reported generalized weakness, associated with progressive worsening shortness of breath and worsening lower extremity edema.  He reported ongoing fever with chills and rigors, she presents this afternoon.  n addition today he had a fall, due to weakness, without reported head injury.  He decided come to ED for evaluation of her symptoms. In the ED he was febrile to 103.93F.  Initially hypotensive, blood pressure as low as 87/49, improved after fluid resuscitation to 101/58.  He was not tachycardic heart rate 81/min, and not hypoxic 100% room air. CBC was significant for leukocytosis 17.4, anemia hemoglobin 12.7. Chemistry showed no significant abnormalities, normal renal and hepatic function. Noted lactic acidosis 2.3 improved to 2.1 after volume expansion. Patient was negative for coronavirus 2019, as well as influenza.  Analysis was not concerning for infection. Chest x-ray revealed left lower lung opacities concerning for pneumonia. CT head showed no acute cardiopulmonary findings. Patient was treated with aggressive volume resuscitation, as well as received IV antibiotics ceftriaxone and azithromycin. Patient will be admitted to hospital for further evaluation and treatment  Review of Systems: Review of Systems  Constitutional: Positive for chills, fever and malaise/fatigue.  Eyes: Negative  for blurred vision.  Respiratory: Positive for shortness of breath. Negative for cough, hemoptysis and wheezing.   Cardiovascular: Positive for orthopnea and leg swelling. Negative for chest pain.  Gastrointestinal: Negative for abdominal pain, constipation, diarrhea, nausea and vomiting.  Genitourinary: Negative for dysuria, frequency and urgency.  Musculoskeletal: Positive for myalgias.  Neurological: Negative for dizziness, sensory change, focal weakness and headaches.  Psychiatric/Behavioral: Negative for depression. The patient is not nervous/anxious.    As per HPI otherwise all other systems reviewed and are negative.  Past Medical History:  Diagnosis Date  . Acute diastolic heart failure ([place]) 08/03/2020  . Arthritis   . Diverticulosis 05/2015   Mild, noted on colonosocpy  . Fatty liver 05/14/2015   noted on [place] ABD  . History of colon polyps 05/2014   sessile in ascending colon, pedunculated in sigmoid colon, diverticulosis  . Hyperlipidemia    pt denies  . Hypertension    not currently taking medications per MD  . OSA (obstructive sleep apnea) 08/03/2020  . 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 Umbilical hernia   . Varicose veins     Past Surgical History:  Procedure Laterality Date  . APPENDECTOMY     childhood  . BIOPSY  07/11/2020   Procedure: BIOPSY;  Surgeon: Ladene Artist, MD;  Location: [place];  Service: Endoscopy;;  . CARDIOVERSION N/A 07/13/2020   Procedure: CARDIOVERSION;  Surgeon: Josue Hector, MD;  Location: [facility] ENDOSCOPY;  Service: Cardiovascular;  Laterality: N/A;  . CARDIOVERSION N/A 08/02/2020   Procedure: CARDIOVERSION;  Surgeon: Werner Lean, MD;  Location: [facility] ENDOSCOPY;  Service: Cardiovascular;  Laterality: N/A;  . COLONOSCOPY W/ POLYPECTOMY  05/23/2014  . COLONOSCOPY WITH PROPOFOL N/A 07/11/2020   Procedure: COLONOSCOPY WITH PROPOFOL;  Surgeon: Ladene Artist, MD;  Location: [facility] ENDOSCOPY;  Service: Endoscopy;   Laterality: N/A;  . ESOPHAGOGASTRODUODENOSCOPY (EGD) WITH PROPOFOL N/A 07/11/2020   Procedure: ESOPHAGOGASTRODUODENOSCOPY (EGD) WITH PROPOFOL;  Surgeon: Ladene Artist, MD;  Location: [facility] ENDOSCOPY;  Service: Endoscopy;  Laterality: N/A;  . TEE WITHOUT CARDIOVERSION N/A 07/13/2020   Procedure: TRANSESOPHAGEAL ECHOCARDIOGRAM (TEE);  Surgeon: Josue Hector, MD;  Location: [facility] ENDOSCOPY;  Service: Cardiovascular;  Laterality: N/A;  . UMBILICAL HERNIA REPAIR N/A 04/27/2018   Procedure: [place];  Surgeon: Alphonsa Overall, MD;  Location: [facility] ORS;  Service: General;  Laterality: N/A;  . varicose veins    . WRIST SURGERY      Social History  reports that he has never smoked. He has never used smokeless tobacco. He reports current alcohol use. He reports that he does not use drugs.  No Known Allergies  Family History  Problem Relation Age of Onset  . Colon cancer Neg Hx   . Esophageal cancer Neg Hx   . Pancreatic cancer Neg Hx   . Prostate cancer Neg Hx   . Rectal cancer Neg Hx   . Stomach cancer Neg Hx    Prior to Admission medications   Medication Sig Start Date End Date Taking? Authorizing Provider  amiodarone (PACERONE) 200 MG tablet Take 200 mg by mouth daily.   Yes [provider]  apixaban (ELIQUIS) 5 MG TABS tablet Take 1 tablet (5 mg total) by mouth 2 (two) times daily. 06/01/19  Yes [name], [name], MD  furosemide (LASIX) 40 MG tablet Take 40 mg by mouth daily.   Yes [provider]  metoprolol tartrate (LOPRESSOR) 100 MG tablet Take 1 tablet (100 mg total) by mouth 2 (two) times daily. Patient taking differently: Take 100 mg by mouth daily. 08/03/20  Yes Isaiah Serge, NP  nitroGLYCERIN (NITROSTAT) 0.4 MG SL tablet Place 1 tablet (0.4 mg total) under the tongue every 5 (five) minutes x 3 doses as needed for chest pain. 08/03/20  Yes Isaiah Serge, NP  vitamin B-12 (CYANOCOBALAMIN) 1000 MCG tablet Take 1,000 mcg by mouth daily.   Yes  [provider]  atorvastatin (LIPITOR) 40 MG tablet Take 1 tablet (40 mg total) by mouth daily. Patient not taking: Reported on 09/24/2020 07/14/20 07/14/21  George Hugh, MD  ferrous sulfate 325 (65 FE) MG tablet Take 1 tablet (325 mg total) by mouth 2 (two) times daily with a meal. 07/14/20 08/13/20  George Hugh, MD  pantoprazole (PROTONIX) 40 MG tablet Take 1 tablet (40 mg total) by mouth daily. Patient not taking: Reported on 09/24/2020 05/10/20   Elgergawy, Silver Huguenin, MD    Physical Exam: Vitals:   09/24/20 1745 09/24/20 1800 09/24/20 1815 09/24/20 1830  BP: (!) 90/55 100/61 (!) 95/54 (!) 101/58  Pulse: 79 80 80 81  Resp: (!) 21 17 17 19   Temp:      TempSrc:      SpO2: 100% 90% 100% 100%  Weight:      Height:        Constitutional: NAD, calm, uncomfortable due to shortness of breath Vitals:   09/24/20 1745 09/24/20 1800 09/24/20 1815 09/24/20 1830  BP: (!) 90/55 100/61 (!) 95/54 (!) 101/58  Pulse: 79 80 80 81  Resp: (!) 21 17 17 19   Temp:      TempSrc:      SpO2: 100% 90% 100% 100%  Weight:      Height:  Eyes: PERRL, lids and conjunctivae normal ENMT: Mucous membranes are moist. Posterior pharynx clear of any exudate or lesions.Normal dentition.  Neck: normal, supple, no masses, no thyromegaly Respiratory: clear to auscultation bilaterally, no wheezing, no crackles. Normal respiratory effort. No accessory muscle use.  Cardiovascular: Regular rate and rhythm, no murmurs / rubs / gallops.  +2 extremity edema.  +1 pedal pulses. No carotid bruits.  Abdomen: no tenderness, no masses palpated. No hepatosplenomegaly. Bowel sounds positive.  Musculoskeletal: no clubbing / cyanosis. No joint deformity upper and lower extremities. Good ROM, no contractures. Normal muscle tone.  Skin: No rashes, excessive venous study dermatitis of lower extremities, with extensive erythema and warmth to right foot and right medial thigh area concerning for colitis, there was purplish  discoloration to his both feet Neurologic: CN 2-12 grossly intact. Sensation intact, DTR normal. Strength 5/5 in all 4.  Psychiatric: Normal judgment and insight. Alert and oriented x 3. Normal mood.   Labs on Admission: I have personally reviewed following labs and imaging studies  CBC: Recent Labs  Lab 09/24/20 1310  WBC 17.4*  NEUTROABS 14.8*  HGB 12.7*  HCT 41.6  MCV 88.5  PLT 650    Basic Metabolic Panel: Recent Labs  Lab 09/24/20 1310  NA 136  K 3.6  CL 101  CO2 24  GLUCOSE 149*  BUN 19  CREATININE 1.08  CALCIUM 9.5    GFR: Estimated Creatinine Clearance: 76.4 mL/min (by C-G formula based on SCr of 1.08 mg/dL).  Liver Function Tests: Recent Labs  Lab 09/24/20 1310  AST 30  ALT 18  ALKPHOS 104  BILITOT 0.4  PROT 7.1  ALBUMIN 3.3*    Urine analysis:    Component Value Date/Time   COLORURINE YELLOW 09/24/2020 1354   APPEARANCEUR CLEAR 09/24/2020 1354   LABSPEC 1.014 09/24/2020 1354   PHURINE 5.0 09/24/2020 1354   GLUCOSEU NEGATIVE 09/24/2020 1354   HGBUR SMALL (A) 09/24/2020 1354   BILIRUBINUR NEGATIVE 09/24/2020 1354   KETONESUR NEGATIVE 09/24/2020 1354   PROTEINUR NEGATIVE 09/24/2020 1354   NITRITE NEGATIVE 09/24/2020 1354   LEUKOCYTESUR NEGATIVE 09/24/2020 1354    Radiological Exams on Admission: CT Head Wo Contrast  Result Date: 09/24/2020 CLINICAL DATA:  75 year old male with head trauma. EXAM: CT HEAD WITHOUT CONTRAST TECHNIQUE: Contiguous axial images were obtained from the base of the skull through the vertex without intravenous contrast. COMPARISON:  None. FINDINGS: Brain: There is mild age-related atrophy and moderate chronic microvascular ischemic changes. There is no acute intracranial hemorrhage. No mass effect or midline shift. No extra-axial fluid collection. Vascular: No hyperdense vessel or unexpected calcification. Skull: Normal. Negative for fracture or focal lesion. Sinuses/Orbits: No acute finding. Other: None IMPRESSION: 1. No  acute intracranial pathology. 2. Age-related atrophy and chronic microvascular ischemic changes. Electronically Signed   By: Anner Crete M.D.   On: 09/24/2020 16:10   DG Chest Portable 1 View  Result Date: 09/24/2020 CLINICAL DATA:  Shortness of breath fever and fall EXAM: PORTABLE CHEST 1 VIEW COMPARISON:  Chest radiograph August 01, 2020 FINDINGS: The heart size and mediastinal contours are partially obscured but appears unchanged. Stable to slightly increased left lower lobe opacities. Similar small bilateral pleural effusions. The visualized skeletal structures are unchanged. IMPRESSION: 1. Stable to slightly increased left lower lobe opacities which could reflect atelectasis and/or infection. 2. Similar small bilateral pleural effusions. Electronically Signed   By: Dahlia Bailiff MD   On: 09/24/2020 14:05    EKG: Independently reviewed.  Revealed sinus tachycardia  102/min with right bundle branch block  Assessment/Plan Principal Problem:   Sepsis ([place]) Active Problems:   Cellulitis of leg, right   Essential hypertension   A-fib (HCC)   OSA (obstructive sleep apnea)   Pneumonia due to infectious organism   Obesity, Class III, BMI 40-49.9 (morbid obesity) (HCC)   Lactic acidosis   Sepsis due to pneumonia ([place])   Sepsis likely due to combination of right lower externally cellulitis and pneumonia likely gram-positive Lactic acidosis Sepsis induced hypotension Patient has underlying infection related to wound right lower extremity cellulitis and left lower lobe pneumonia.   Patient is Covid negative, suspect bacterial pneumonia due to gram-positive organism. Suspect as well streptococcal skin infection due to nonpurulent appearance. Patient does have mild hypotension, which responded to volume resuscitation.  May require pressors as needed Follow-up blood cultures, and continue on antibiotics with ceftriaxone and azithromycin.  Paroxysmal atrial fibrillation Heart rate is  controlled at present. Continue amiodarone and metoprolol with holding parameters due to hypotension.  Continue anticoagulation with Eliquis.   Essential hypertension Blood pressure low on admission due to sepsis.  Diuretics are on hold now.  Chronic diastolic congestive heart failure Patient has evidence of volume overload, although unable to diuresis due to hypotension. Continue to provide supportive care, and introduce diuretics back once able to control volume.  Obstructive sleep apnea     CPAP as needed  Morbid obesity BMI 41 Weight loss strongly encouraged, as complicates all hospital medical care   DVT prophylaxis: Eliquis Code Status:        Full code Family Communication:  Discussed case with daughter Disposition Plan:   Patient is from:  Home  Anticipated DC to:  Home versus rehab  Anticipated DC date:  3 to 5 days  Anticipated DC barriers: None  Consults called:  None Admission status:  Admit inpatient  Severity of Illness: The appropriate patient status for this patient is INPATIENT. Inpatient status is judged to be reasonable and necessary in order to provide the required intensity of service to ensure the patient's safety. The patient's presenting symptoms, physical exam findings, and initial radiographic and laboratory data in the context of their chronic comorbidities is felt to place them at high risk for further clinical deterioration. Furthermore, it is not anticipated that the patient will be medically stable for discharge from the hospital within 2 midnights of admission. The following factors support the patient status of inpatient.   " The patient's presenting symptoms include.  Shortness of breath, fever with chills, lower extremity edema with redness " The worrisome physical exam findings include.  Hypertension.  Cellulitis of right lower extremity " The initial radiographic and laboratory data are worrisome because of lactacidosis " The chronic  co-morbidities include hypertension, atrial fibrillation, congestive heart failure   * I certify that at the point of admission it is my clinical judgment that the patient will require inpatient hospital care spanning beyond 2 midnights from the point of admission due to high intensity of service, high risk for further deterioration and high frequency of surveillance required.Allie Dimmer MD [facility]  How to contact the [facility] Attending or Consulting provider [place] or covering provider during after hours [place], for this patient?   1. Check the care team in [facility] and look for a) attending/consulting [facility] provider listed and b) the [facility] team listed 2. Log into [email or web address] and use [place]'s universal password to access. If you do not have the password, please  contact the hospital operator. 3. Locate the [facility] provider you are looking for under [facility] and page to a number that you can be directly reached. 4. If you still have difficulty reaching the provider, please page the [facility] (Director on Call) for the Hospitalists listed on amion for assistance.  09/24/2020, 7:00 PM

## 2020-09-24 NOTE — ED Provider Notes (Signed)
Hand off from Dr. Fulton Reek 75 yo male ho chf, copd, presents with increasing, sob, fever and fall Physical Exam  BP (!) 91/52   Pulse 81   Temp (!) 101.3 F (38.5 C) (Oral)   Resp (!) 26   Ht 1.727 m (5\' 8" )   Wt 122.5 kg   SpO2 95%   BMI 41.05 kg/m   Physical Exam  ED Course/Procedures     Procedures  MDM  Patient with borderline hypotension Received >30 cc/kg bolus ABX to cover potential pneumonia and cellulitis Awaiting head ct and covid Plan admission for further evaluation and treatment  5:18 PM Head ct negative bp 89/52-  MAP 62 despite fluid resusc Repeat bp 92/57- patient awake and alert Bilateral lower extremity erythema with pitting edema, erythema proximally to right medial thigh- c.w. cellulitis, on anticoagulation with reported history of dvt, may need doppler Discussed with Dr. Corinna Capra who will see for admission         Pattricia Boss, MD 09/24/20 [phone]

## 2020-09-24 NOTE — ED Provider Notes (Addendum)
[facility] EMERGENCY DEPARTMENT Provider Note   CSN: [id number] Arrival date & time: 09/24/20  1247     History Chief Complaint  Patient presents with  . Shortness of Breath    Shane Diaz is a 75 y.o. male.  HPI   75 year old male with past medical history of HTN, HLD, CHF, atrial fibrillation anticoagulated on Eliquis, OSA, usually on 4 L of supplemental oxygen nasal cannula at home presents to the emergency department concern for hypoxia, shortness of breath, fever, fatigue and recent fall.  Patient has been without his oxygen for the past week due to his tank running out.  He states that earlier today while he was outside he became very short of breath, causing him to lose his balance and fall backwards.  The patient states that he fell back and flat, does not think he hit his head.  There was no syncope.  When EMS came to evaluate the patient they noticed that he was hypoxic and febrile.  Patient states for the past couple days he has been having acute on chronic swelling of his bilateral lower extremities with worsening redness on the left.  Otherwise denies any GI symptoms.  He has had a decreased appetite, he is vaccinated for Covid.  Past Medical History:  Diagnosis Date  . Acute diastolic heart failure ([place]) 08/03/2020  . Arthritis   . Diverticulosis 05/2015   Mild, noted on colonosocpy  . Fatty liver 05/14/2015   noted on [place] ABD  . History of colon polyps 05/2014   sessile in ascending colon, pedunculated in sigmoid colon, diverticulosis  . Hyperlipidemia    pt denies  . Hypertension    not currently taking medications per MD  . OSA (obstructive sleep apnea) 08/03/2020  . 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 Umbilical hernia   . Varicose veins     Patient Active Problem List   Diagnosis Date Noted  . Acute diastolic heart failure ([place]) 08/03/2020  . OSA (obstructive sleep apnea) 08/03/2020  . DVT, bilateral lower limbs ([place])  hx of 08/03/2020  . Coronary artery calcification seen on CT scan 08/03/2020  . Atrial fibrillation with RVR ([place]) 08/01/2020  . Iron deficiency anemia   . Occult blood in stools   . A-fib ([place]) 07/07/2020  . Pneumonia due to COVID-19 virus 05/08/2020  . Sepsis ([place]) 05/08/2020  . Atrial fibrillation with rapid ventricular response ([place]) 05/08/2020  . Elevated troponin 05/08/2020  . Essential hypertension 05/08/2020  . Varicose veins of leg with complications 99/35/7017  . Varicose veins of left lower extremity with complications 79/39/0300  . Varicose veins of bilateral lower extremities with other complications 92/33/0076  . Special screening for malignant neoplasms, colon 04/11/2014    Past Surgical History:  Procedure Laterality Date  . APPENDECTOMY     childhood  . BIOPSY  07/11/2020   Procedure: BIOPSY;  Surgeon: Ladene Artist, MD;  Location: [place];  Service: Endoscopy;;  . CARDIOVERSION N/A 07/13/2020   Procedure: CARDIOVERSION;  Surgeon: Josue Hector, MD;  Location: [facility] ENDOSCOPY;  Service: Cardiovascular;  Laterality: N/A;  . CARDIOVERSION N/A 08/02/2020   Procedure: CARDIOVERSION;  Surgeon: Werner Lean, MD;  Location: [facility] ENDOSCOPY;  Service: Cardiovascular;  Laterality: N/A;  . COLONOSCOPY W/ POLYPECTOMY  05/23/2014  . COLONOSCOPY WITH PROPOFOL N/A 07/11/2020   Procedure: COLONOSCOPY WITH PROPOFOL;  Surgeon: Ladene Artist, MD;  Location: [facility] ENDOSCOPY;  Service: Endoscopy;  Laterality: N/A;  .  ESOPHAGOGASTRODUODENOSCOPY (EGD) WITH PROPOFOL N/A 07/11/2020   Procedure: ESOPHAGOGASTRODUODENOSCOPY (EGD) WITH PROPOFOL;  Surgeon: Ladene Artist, MD;  Location[facility] END[facility]COPY;  Service: Endoscopy;  Laterality: N/A;  . TEE WITHOUT CARDIOVERSION N/A 07/13/2020   Procedure: TRANSESOPHAGEAL ECHOCARDIOGRAM (TEE);  Surgeon: Josue Hector, MD;  Location: [facility] ENDOSCOPY;  Service: Cardiovascular;  Laterality: N/A;  . UMBILICAL HERNIA REPAIR N/A 04/27/2018    Procedure: [place];  Surgeon: Alphonsa Overall, MD;  Location: [facility] ORS;  Service: General;  Laterality: N/A;  . varicose veins    . WRIST SURGERY         Family History  Problem Relation Age of Onset  . Colon cancer Neg Hx   . Esophageal cancer Neg Hx   . Pancreatic cancer Neg Hx   . Prostate cancer Neg Hx   . Rectal cancer Neg Hx   . Stomach cancer Neg Hx     Social History   Tobacco Use  . Smoking status: Never Smoker  . Smokeless tobacco: Never Used  Vaping Use  . Vaping Use: Never used  Substance Use Topics  . Alcohol use: Yes    Alcohol/week: 0.0 standard drinks    Comment: occassional wine, martini  . Drug use: No    Home Medications Prior to Admission medications   Medication Sig Start Date End Date Taking? Authorizing Provider  amiodarone (PACERONE) 200 MG tablet Take 200 mg by mouth daily.   Yes [provider]  apixaban (ELIQUIS) 5 MG TABS tablet Take 1 tablet (5 mg total) by mouth 2 (two) times daily. 06/01/19  Yes [name], [name], MD  atorvastatin (LIPITOR) 40 MG tablet Take 1 tablet (40 mg total) by mouth daily. 07/14/20 07/14/21 Yes Masoud, Jarrett Soho, MD  furosemide (LASIX) 40 MG tablet Take 40 mg by mouth 2 (two) times daily.   Yes [provider]  metoprolol tartrate (LOPRESSOR) 100 MG tablet Take 1 tablet (100 mg total) by mouth 2 (two) times daily. 08/03/20  Yes Isaiah Serge, NP  ferrous sulfate 325 (65 FE) MG tablet Take 1 tablet (325 mg total) by mouth 2 (two) times daily with a meal. 07/14/20 08/13/20  George Hugh, MD  nitroGLYCERIN (NITROSTAT) 0.4 MG SL tablet Place 1 tablet (0.4 mg total) under the tongue every 5 (five) minutes x 3 doses as needed for chest pain. 08/03/20   Isaiah Serge, NP  pantoprazole (PROTONIX) 40 MG tablet Take 1 tablet (40 mg total) by mouth daily. 05/10/20   Elgergawy, Silver Huguenin, MD  potassium chloride SA (KLOR-CON) 20 MEQ tablet Take 20 mEq by mouth 2 (two) times daily.    [provider]  triamcinolone (KENALOG) 0.1 % Apply 1 application topically at bedtime. For legs Patient not taking: Reported on 08/22/2020    [provider]    Allergies    Patient has no known allergies.  Review of Systems   Review of Systems  Constitutional: Positive for fatigue and fever. Negative for chills.  HENT: Negative for congestion.   Eyes: Negative for visual disturbance.  Respiratory: Positive for cough and shortness of breath.   Cardiovascular: Positive for leg swelling. Negative for chest pain.  Gastrointestinal: Negative for abdominal pain, diarrhea and vomiting.  Genitourinary: Negative for dysuria.  Skin: Negative for rash.  Neurological: Negative for headaches.    Physical Exam Updated Vital Signs BP (!) 93/51   Pulse 84   Temp (!) 101.3 F (38.5 C) (Oral)   Resp (!) 31   Ht  5\' 8"  (1.727 m)   Wt 122.5 kg   SpO2 98%   BMI 41.05 kg/m   Physical Exam Vitals and nursing note reviewed.  Constitutional:      Appearance: Normal appearance.  HENT:     Head: Normocephalic.     Mouth/Throat:     Mouth: Mucous membranes are moist.  Eyes:     Pupils: Pupils are equal, round, and reactive to light.  Cardiovascular:     Rate and Rhythm: Normal rate.  Pulmonary:     Effort: Pulmonary effort is normal. Tachypnea present. No respiratory distress.     Breath sounds: Examination of the left-upper field reveals decreased breath sounds. Examination of the right-middle field reveals decreased breath sounds. Decreased breath sounds and rales present.  Chest:     Chest wall: No tenderness.  Abdominal:     Palpations: Abdomen is soft.     Tenderness: There is no abdominal tenderness.  Musculoskeletal:     Cervical back: Normal range of motion and neck supple.  Skin:    General: Skin is warm.  Neurological:     Mental Status: He is alert and oriented to person, place, and time. Mental status is at baseline.  Psychiatric:        Mood and Affect: Mood  normal.     ED Results / Procedures / Treatments   Labs (all labs ordered are listed, but only abnormal results are displayed) Labs Reviewed  LACTIC ACID, PLASMA - Abnormal; Notable for the following components:      Result Value   Lactic Acid, Venous 2.3 (*)    All other components within normal limits  LACTIC ACID, PLASMA - Abnormal; Notable for the following components:   Lactic Acid, Venous 2.9 (*)    All other components within normal limits  COMPREHENSIVE METABOLIC PANEL - Abnormal; Notable for the following components:   Glucose, Bld 149 (*)    Albumin 3.3 (*)    All other components within normal limits  CBC WITH DIFFERENTIAL/PLATELET - Abnormal; Notable for the following components:   WBC 17.4 (*)    Hemoglobin 12.7 (*)    RDW 16.8 (*)    Neutro Abs 14.8 (*)    Abs Immature Granulocytes 0.16 (*)    All other components within normal limits  URINALYSIS, ROUTINE W REFLEX MICROSCOPIC - Abnormal; Notable for the following components:   Hgb urine dipstick SMALL (*)    All other components within normal limits  CULTURE, BLOOD (ROUTINE X 2)  CULTURE, BLOOD (ROUTINE X 2)  RESP PANEL BY RT-PCR (FLU A&B, COVID) ARPGX2  PROTIME-INR    EKG EKG Interpretation  Date/Time:  Monday September 24 2020 13:03:34 EST Ventricular Rate:  102 PR Interval:  202 QRS Duration: 138 QT Interval:  378 QTC Calculation: 492 R Axis:   -12 Text Interpretation: Sinus tachycardia Right bundle branch block Abnormal ECG Sinus tachycardia,  RBBB unchanged Confirmed by Lavenia Atlas [phone]) on 09/24/2020 2:56:51 PM   Radiology DG Chest Portable 1 View  Result Date: 09/24/2020 CLINICAL DATA:  Shortness of breath fever and fall EXAM: PORTABLE CHEST 1 VIEW COMPARISON:  Chest radiograph August 01, 2020 FINDINGS: The heart size and mediastinal contours are partially obscured but appears unchanged. Stable to slightly increased left lower lobe opacities. Similar small bilateral pleural effusions. The  visualized skeletal structures are unchanged. IMPRESSION: 1. Stable to slightly increased left lower lobe opacities which could reflect atelectasis and/or infection. 2. Similar small bilateral pleural effusions. Electronically Signed  By: Dahlia Bailiff MD   On: 09/24/2020 14:05    Procedures .Critical Care Performed by: Lorelle Gibbs, DO Authorized by: Lorelle Gibbs, DO   Critical care provider statement:    Critical care time (minutes):  35   Critical care was time spent personally by me on the following activities:  Discussions with consultants, evaluation of patient's response to treatment, examination of patient, ordering and performing treatments and interventions, ordering and review of laboratory studies, ordering and review of radiographic studies, pulse oximetry, re-evaluation of patient's condition, obtaining history from patient or surrogate and review of old charts     Medications Ordered in ED Medications  lactated ringers infusion (has no administration in time range)  cefTRIAXone (ROCEPHIN) 2 g in sodium chloride 0.9 % 100 mL IVPB (2 g Intravenous New Bag/Given 09/24/20 1513)  azithromycin (ZITHROMAX) 500 mg in sodium chloride 0.9 % 250 mL IVPB (has no administration in time range)  lactated ringers bolus 1,000 mL (1,000 mLs Intravenous New Bag/Given 09/24/20 1517)  acetaminophen (TYLENOL) tablet 650 mg (650 mg Oral Given 09/24/20 1330)    ED Course  I have reviewed the triage vital signs and the nursing notes.  Pertinent labs & imaging results that were available during my care of the patient were reviewed by me and considered in my medical decision making (see chart for details).    MDM Rules/Calculators/A&P                          75 year old male presents the emergency department shortness of breath and fever.  He was noted to be hypoxic on room air prior to arrival but he is supposed to be wearing 4 L of supplemental oxygen.  He is currently on 4 L nasal  cannula and his oxygenation is normal.  He is febrile, hypotensive.  Concerning picture for sepsis.  He is otherwise sitting up and conversational, Rales on lung auscultation.  EKG shows sinus tach, right bundle branch block which is unchanged.  Blood work shows cytosis with elevated lactic, urinalysis shows no infection, no significant electrolyte abnormalities.  Chest x-ray looks concerning for possible pneumonia.  Patient will be treated with antibiotics for possible community-acquired pneumonia and lower leg cellulitis, we are pending Covid swab.  Of note, the patient believes he got IV fluids with EMS. His calculated fluids would be >3600 mls. In the setting of CHF I am concerned to give the 30 cc/kg initially.  We will continually hydrate as appropriate.  Patient signed out to Dr. Jeanell Sparrow pending results.  Anticipate admission.  Final Clinical Impression(s) / ED Diagnoses Final diagnoses:  None    Rx / DC Orders ED Discharge Orders    None       Lorelle Gibbs, DO 09/24/20 1530    Myangel Summons, Alvin Critchley, DO 09/24/20 1540    Geoge Lawrance, Alvin Critchley, DO 09/24/20 1546

## 2020-09-24 NOTE — ED Notes (Signed)
Pt placed on 6L [place]. 

## 2020-09-24 NOTE — ED Triage Notes (Signed)
Pt arrives to ED from [facility] with c/o shortness of breath, fever, and fall. Per EMS pt has been out of home oxygen x1 week and pt is normally on 4L [place]. Today pt became extremely SOB and fell, but able to catch self. Pt reports fevers opf 102 starting this morning. States leg/abd swelling.

## 2020-09-24 NOTE — Progress Notes (Signed)
Following for code sepsis 

## 2020-09-24 NOTE — Progress Notes (Signed)
Notified provider of need to order lactic acid @ 1600.

## 2020-09-24 NOTE — Progress Notes (Signed)
Notified provider of need to order fluid bolus, pt needs 3675 cc per protocol, bedside RN aware as well.

## 2020-09-25 DIAGNOSIS — I10 Essential (primary) hypertension: Secondary | ICD-10-CM | POA: Diagnosis not present

## 2020-09-25 DIAGNOSIS — I480 Paroxysmal atrial fibrillation: Secondary | ICD-10-CM | POA: Diagnosis not present

## 2020-09-25 DIAGNOSIS — A419 Sepsis, unspecified organism: Secondary | ICD-10-CM | POA: Diagnosis not present

## 2020-09-25 DIAGNOSIS — L03115 Cellulitis of right lower limb: Secondary | ICD-10-CM | POA: Diagnosis not present

## 2020-09-25 LAB — BASIC METABOLIC PANEL
Anion gap: 8 (ref 5–15)
BUN: 20 mg/dL (ref 8–23)
CO2: 28 mmol/L (ref 22–32)
Calcium: 9 mg/dL (ref 8.9–10.3)
Chloride: 100 mmol/L (ref 98–111)
Creatinine, Ser: 0.99 mg/dL (ref 0.61–1.24)
GFR, Estimated: >60 mL/min (ref >60)
Glucose, Bld: 112 mg/dL — ABNORMAL HIGH (ref 70–99)
Potassium: 4.5 mmol/L (ref 3.5–5.1)
Sodium: 136 mmol/L (ref 135–145)

## 2020-09-25 LAB — CBC
HCT: 38 % — ABNORMAL LOW (ref 39.0–52.0)
Hemoglobin: 11.2 g/dL — ABNORMAL LOW (ref 13.0–17.0)
MCH: 26.9 pg (ref 26.0–34.0)
MCHC: 29.5 g/dL — ABNORMAL LOW (ref 30.0–36.0)
MCV: 91.3 fL (ref 80.0–100.0)
Platelets: 234 10*3/uL (ref 150–400)
RBC: 4.16 MIL/uL — ABNORMAL LOW (ref 4.22–5.81)
RDW: 17.1 % — ABNORMAL HIGH (ref 11.5–15.5)
WBC: 18.4 10*3/uL — ABNORMAL HIGH (ref 4.0–10.5)
nRBC: 0 % (ref 0.0–0.2)

## 2020-09-25 NOTE — Progress Notes (Addendum)
Patient ID: Shane Diaz, male   DOB: 04-Jul-1946, 75 y.o.   MRN: [id number]  PROGRESS NOTE    Darrow Barreiro  PTW:[id number] DOB: 04-10-1946 DOA: 09/24/2020 PCP: [name], MD   Brief Narrative:  75 year old male with history of hypertension, hyperlipidemia, obstructive sleep apnea, paroxysmal A. fib, chronic diastolic CHF, obesity presented with generalized weakness or shortness of breath and fever.  Presentation, he was febrile to 103.1 and initially hypotensive with a blood pressure of 87/49 which improved to 101/58 after fluid resuscitation.  WBCs of 17.4, lactic acid of 2.3 which improved to 2.1 after fluid resuscitation.  COVID-19 and influenza testing were negative.  Chest x-ray showed left lower lobe opacities concerning for pneumonia.  He was started on Rocephin and Zithromax.  Assessment & Plan:   Severe sepsis: Present on admission, possibly from below Community-acquired bacterial pneumonia Possible right lower extremity cellulitis Leukocytosis -Patient presented with generalized weakness and was febrile, hypotensive with leukocytosis and lactic acid of 2.3 which improved with IV fluids and chest x-ray showing possible left lower lobe pneumonia.  He also has possibly right lower extremity cellulitis.  COVID-19 and flu testing were negative -Cultures negative so far. -Blood pressure improving but still on the lower side.  DC IV fluids.  Continue Rocephin and Zithromax. -Repeat a.m. labs.  Paroxysmal A. Fib -Currently rate controlled.  Continue amiodarone, Eliquis and metoprolol  Essential hypertension -Blood pressure improving.  Continue metoprolol.  Diuretics on hold for now  Current diastolic heart failure -will hold diuretics for 1 more day and possibly resume in a.m.  IV fluids as above.  Strict input and output.  Daily weights.  Fluid restriction  Morbid obesity-outpatient follow-up   DVT prophylaxis: Eliquis Code Status: Full Family Communication: None at  bedside Disposition Plan: Status is: Inpatient  Remains inpatient appropriate because:Inpatient level of care appropriate due to severity of illness   Dispo: The patient is from: Home              Anticipated d/c is to: Home              Anticipated d/c date is: 2 days              Patient currently is not medically stable to d/c.   Difficult to place patient No  Consultants: None  Procedures: None  Antimicrobials: Rocephin and Zithromax from 09/24/20 onwards   Subjective: Patient seen and examined at bedside.  Feels slightly better but still complains of some cough and shortness of breath.  No overnight fever or vomiting reported.  Still feels weak.  Objective: Vitals:   09/25/20 0730 09/25/20 0800 09/25/20 0821 09/25/20 0845  BP: (!) 126/56 111/68 117/73 112/60  Pulse: 71 78 82 80  Resp: 18 13  (!) 24  Temp:      TempSrc:      SpO2: 98% 99%  99%  Weight:      Height:        Intake/Output Summary (Last 24 hours) at 09/25/2020 1107 Last data filed at 09/24/2020 1852 Gross per 24 hour  Intake 360.45 ml  Output --  Net 360.45 ml   Filed Weights   09/24/20 1402  Weight: 122.5 kg    Examination:  General exam: Appears calm and comfortable.  Looks chronically ill.  Currently on 3 L oxygen via nasal cannula Respiratory system: Bilateral decreased breath sounds at bases with some scattered crackles.  Intermittently tachypneic Cardiovascular system: S1 & S2 heard, Rate controlled Gastrointestinal system: Abdomen is  morbidly obese, nondistended, soft and nontender. Normal bowel sounds heard. Extremities: No cyanosis, clubbing; bilateral lower extremity edema present discharge Central nervous system: Alert and oriented. No focal neurological deficits. Moving extremities Skin: Bilateral chronic skin changes of lower extremities.  With [place] stasis dermatitis with some erythema and warmth to right foot and right medial thigh area Psychiatry: Judgement and insight appear  normal. Mood & affect appropriate.     Data Reviewed: I have personally reviewed following labs and imaging studies  CBC: Recent Labs  Lab 09/24/20 1310 09/25/20 0359  WBC 17.4* 18.4*  NEUTROABS 14.8*  --   HGB 12.7* 11.2*  HCT 41.6 38.0*  MCV 88.5 91.3  PLT 294 025   Basic Metabolic Panel: Recent Labs  Lab 09/24/20 1310 09/25/20 0359  NA 136 136  K 3.6 4.5  CL 101 100  CO2 24 28  GLUCOSE 149* 112*  BUN 19 20  CREATININE 1.08 0.99  CALCIUM 9.5 9.0   GFR: Estimated Creatinine Clearance: 83.3 mL/min (by C-G formula based on SCr of 0.99 mg/dL). Liver Function Tests: Recent Labs  Lab 09/24/20 1310  AST 30  ALT 18  ALKPHOS 104  BILITOT 0.4  PROT 7.1  ALBUMIN 3.3*   No results for input(s): LIPASE, AMYLASE in the last 168 hours. No results for input(s): AMMONIA in the last 168 hours. Coagulation Profile: Recent Labs  Lab 09/24/20 1310  INR 1.2   Cardiac Enzymes: No results for input(s): CKTOTAL, CKMB, CKMBINDEX, TROPONINI in the last 168 hours. BNP (last 3 results) No results for input(s): PROBNP in the last 8760 hours. HbA1C: No results for input(s): HGBA1C in the last 72 hours. CBG: No results for input(s): GLUCAP in the last 168 hours. Lipid Profile: No results for input(s): CHOL, HDL, LDLCALC, TRIG, CHOLHDL, LDLDIRECT in the last 72 hours. Thyroid Function Tests: No results for input(s): TSH, T4TOTAL, FREET4, T3FREE, THYROIDAB in the last 72 hours. Anemia Panel: No results for input(s): VITAMINB12, FOLATE, FERRITIN, TIBC, IRON, RETICCTPCT in the last 72 hours. Sepsis Labs: Recent Labs  Lab 09/24/20 1340 09/24/20 1404 09/24/20 1622  LATICACIDVEN 2.9* 2.3* 2.1*    Recent Results (from the past 240 hour(s))  Culture, blood (Routine x 2)     Status: None (Preliminary result)   Collection Time: 09/24/20  1:20 PM   Specimen: BLOOD RIGHT HAND  Result Value Ref Range Status   Specimen Description BLOOD RIGHT HAND  Final   Special Requests    Final    BOTTLES DRAWN AEROBIC AND ANAEROBIC Blood Culture results may not be optimal due to an excessive volume of blood received in culture bottles   Culture   Final    NO GROWTH < 24 HOURS Performed at [facility], [place] [address], [place], [place] [address]    Report Status PENDING  Incomplete  Culture, blood (Routine x 2)     Status: None (Preliminary result)   Collection Time: 09/24/20  2:04 PM   Specimen: BLOOD RIGHT FOREARM  Result Value Ref Range Status   Specimen Description BLOOD RIGHT FOREARM  Final   Special Requests   Final    BOTTLES DRAWN AEROBIC AND ANAEROBIC Blood Culture adequate volume   Culture   Final    NO GROWTH < 24 HOURS Performed at [facility], [place] [address] [place], [place] [address]    Report Status PENDING  Incomplete  Resp Panel by RT-PCR (Flu A&B, Covid) Nasopharyngeal Swab     Status: None  Collection Time: 09/24/20  3:23 PM   Specimen: Nasopharyngeal Swab; Nasopharyngeal(NP) swabs in vial transport medium  Result Value Ref Range Status   SARS Coronavirus 2 by RT PCR NEGATIVE NEGATIVE Final    Comment: (NOTE) SARS-CoV-2 target nucleic acids are NOT DETECTED.  The SARS-CoV-2 RNA is generally detectable in upper respiratory specimens during the acute phase of infection. The lowest concentration of SARS-CoV-2 viral copies this assay can detect is 138 copies/mL. A negative result does not preclude SARS-Cov-2 infection and should not be used as the sole basis for treatment or other patient management decisions. A negative result may occur with  improper specimen collection/handling, submission of specimen other than nasopharyngeal swab, presence of viral mutation(s) within the areas targeted by this assay, and inadequate number of viral copies(<138 copies/mL). A negative result must be combined with clinical observations, patient history, and epidemiological information. The expected result is Negative.  Fact Sheet for Patients:   EntrepreneurPulse.com.au  Fact Sheet for Healthcare Providers:  IncredibleEmployment.be  This test is no t yet approved or cleared by the [place] FDA and  has been authorized for detection and/or diagnosis of SARS-CoV-2 by FDA under an Emergency Use Authorization (EUA). This EUA will remain  in effect (meaning this test can be used) for the duration of the COVID-19 declaration under Section 564(b)(1) of the Act, 21 U.S.C.section 360bbb-3(b)(1), unless the authorization is terminated  or revoked sooner.       Influenza A by PCR NEGATIVE NEGATIVE Final   Influenza B by PCR NEGATIVE NEGATIVE Final    Comment: (NOTE) The Xpert Xpress SARS-CoV-2/FLU/RSV plus assay is intended as an aid in the diagnosis of influenza from Nasopharyngeal swab specimens and should not be used as a sole basis for treatment. Nasal washings and aspirates are unacceptable for Xpert Xpress SARS-CoV-2/FLU/RSV testing.  Fact Sheet for Patients: EntrepreneurPulse.com.au  Fact Sheet for Healthcare Providers: IncredibleEmployment.be  This test is not yet approved or cleared by the [place] FDA and has been authorized for detection and/or diagnosis of SARS-CoV-2 by FDA under an Emergency Use Authorization (EUA). This EUA will remain in effect (meaning this test can be used) for the duration of the COVID-19 declaration under Section 564(b)(1) of the Act, 21 U.S.C. section 360bbb-3(b)(1), unless the authorization is terminated or revoked.  Performed at [facility], [place] [address], [place], [place] [address]          Radiology Studies: CT Head Wo Contrast  Result Date: 09/24/2020 CLINICAL DATA:  75 year old male with head trauma. EXAM: CT HEAD WITHOUT CONTRAST TECHNIQUE: Contiguous axial images were obtained from the base of the skull through the vertex without intravenous contrast. COMPARISON:  None. FINDINGS: Brain:  There is mild age-related atrophy and moderate chronic microvascular ischemic changes. There is no acute intracranial hemorrhage. No mass effect or midline shift. No extra-axial fluid collection. Vascular: No hyperdense vessel or unexpected calcification. Skull: Normal. Negative for fracture or focal lesion. Sinuses/Orbits: No acute finding. Other: None IMPRESSION: 1. No acute intracranial pathology. 2. Age-related atrophy and chronic microvascular ischemic changes. Electronically Signed   By: Anner Crete M.D.   On: 09/24/2020 16:10   DG Chest Portable 1 View  Result Date: 09/24/2020 CLINICAL DATA:  Shortness of breath fever and fall EXAM: PORTABLE CHEST 1 VIEW COMPARISON:  Chest radiograph August 01, 2020 FINDINGS: The heart size and mediastinal contours are partially obscured but appears unchanged. Stable to slightly increased left lower lobe opacities. Similar small bilateral pleural effusions. The visualized skeletal  structures are unchanged. IMPRESSION: 1. Stable to slightly increased left lower lobe opacities which could reflect atelectasis and/or infection. 2. Similar small bilateral pleural effusions. Electronically Signed   By: Dahlia Bailiff MD   On: 09/24/2020 14:05        Scheduled Meds: . amiodarone  200 mg Oral Daily  . apixaban  5 mg Oral BID  . ferrous sulfate  325 mg Oral Q breakfast  . metoprolol tartrate  50 mg Oral BID   Continuous Infusions: . azithromycin    . cefTRIAXone (ROCEPHIN)  IV            Aline August, MD [facility] 09/25/2020, 11:07 AM

## 2020-09-25 NOTE — ED Notes (Addendum)
Patient cleansed and linens changed due to being soiled with urine. Warm blankets provided. Assisted patient with repositioning. Patient denies further needs.

## 2020-09-25 NOTE — ED Notes (Signed)
Lunch Tray Ordered @ 1006.  

## 2020-09-26 DIAGNOSIS — I480 Paroxysmal atrial fibrillation: Secondary | ICD-10-CM | POA: Diagnosis not present

## 2020-09-26 DIAGNOSIS — A419 Sepsis, unspecified organism: Secondary | ICD-10-CM | POA: Diagnosis not present

## 2020-09-26 DIAGNOSIS — I10 Essential (primary) hypertension: Secondary | ICD-10-CM | POA: Diagnosis not present

## 2020-09-26 DIAGNOSIS — L03115 Cellulitis of right lower limb: Secondary | ICD-10-CM | POA: Diagnosis not present

## 2020-09-26 LAB — BASIC METABOLIC PANEL
Anion gap: 11 (ref 5–15)
BUN: 16 mg/dL (ref 8–23)
CO2: 27 mmol/L (ref 22–32)
Calcium: 9.4 mg/dL (ref 8.9–10.3)
Chloride: 99 mmol/L (ref 98–111)
Creatinine, Ser: 0.74 mg/dL (ref 0.61–1.24)
GFR, Estimated: >60 mL/min (ref >60)
Glucose, Bld: 106 mg/dL — ABNORMAL HIGH (ref 70–99)
Potassium: 4 mmol/L (ref 3.5–5.1)
Sodium: 137 mmol/L (ref 135–145)

## 2020-09-26 LAB — CBC WITH DIFFERENTIAL/PLATELET
Abs Immature Granulocytes: 0.06 10*3/uL (ref 0.00–0.07)
Basophils Absolute: 0.1 10*3/uL (ref 0.0–0.1)
Basophils Relative: 1 %
Eosinophils Absolute: 0.2 10*3/uL (ref 0.0–0.5)
Eosinophils Relative: 1 %
HCT: 33.1 % — ABNORMAL LOW (ref 39.0–52.0)
Hemoglobin: 10.3 g/dL — ABNORMAL LOW (ref 13.0–17.0)
Immature Granulocytes: 1 %
Lymphocytes Relative: 20 %
Lymphs Abs: 2.5 10*3/uL (ref 0.7–4.0)
MCH: 27.9 pg (ref 26.0–34.0)
MCHC: 31.1 g/dL (ref 30.0–36.0)
MCV: 89.7 fL (ref 80.0–100.0)
Monocytes Absolute: 1.4 10*3/uL — ABNORMAL HIGH (ref 0.1–1.0)
Monocytes Relative: 11 %
Neutro Abs: 8.5 10*3/uL — ABNORMAL HIGH (ref 1.7–7.7)
Neutrophils Relative %: 66 %
Platelets: 207 10*3/uL (ref 150–400)
RBC: 3.69 MIL/uL — ABNORMAL LOW (ref 4.22–5.81)
RDW: 17 % — ABNORMAL HIGH (ref 11.5–15.5)
WBC: 12.6 10*3/uL — ABNORMAL HIGH (ref 4.0–10.5)
nRBC: 0 % (ref 0.0–0.2)

## 2020-09-26 LAB — MAGNESIUM: Magnesium: 1.7 mg/dL (ref 1.7–2.4)

## 2020-09-26 MED ORDER — FUROSEMIDE 40 MG PO TABS
40.0000 mg | ORAL_TABLET | Freq: Every day | ORAL | Status: DC
  Administered 2020-09-26 – 2020-09-27 (×2): 40 mg via ORAL
  Filled 2020-09-26 (×2): qty 1

## 2020-09-26 NOTE — Consult Note (Addendum)
WOC Nurse Consult Note: Patient receiving care in [facility] [phone]. Reason for Consult: chronic bilateral lymphedema Wound type: NO wounds to either leg or foot.  Patient has worn compression therapy before and he informs me it worked, and he is willing to use it now. Pressure Injury POA: Yes/No/NA Measurement: Wound bed: Drainage (amount, consistency, odor)  Periwound: Dressing procedure/placement/frequency: I initially ordered twice weekly unna boot application, and almost immediately found a report from March of 2021 indicating a DVT in the left leg.  No studies since that time.  Therefore, I discontinued the order.   Have the patient elevate his legs to the greatest extent possible. Thank you for the consult.  Discussed plan of care with the patient.  [facility] nurse will not follow at this time.  Please re-consult the [facility] team if needed.  Val Riles, RN, MSN, CWOCN, CNS-BC, pager [phone]

## 2020-09-26 NOTE — Discharge Instructions (Signed)

## 2020-09-26 NOTE — Plan of Care (Signed)
?  Problem: Clinical Measurements: ?Goal: Ability to avoid or minimize complications of infection will improve ?Outcome: Progressing ?  ?Problem: Skin Integrity: ?Goal: Skin integrity will improve ?Outcome: Progressing ?  ?

## 2020-09-26 NOTE — Progress Notes (Signed)
Patient ID: Shane Diaz, male   DOB: 08-12-1945, 75 y.o.   MRN: [id number]  PROGRESS NOTE    Shane Diaz  NOI:[id number] DOB: [birth year] DOA: 09/24/2020 PCP: [name], MD   Brief Narrative:  75 year old male with history of hypertension, hyperlipidemia, obstructive sleep apnea, chronic hypoxic respiratory failure on 3 L oxygen via nasal cannula at home, paroxysmal A. fib, chronic diastolic CHF, obesity presented with generalized weakness or shortness of breath and fever.  Presentation, he was febrile to 103.1 and initially hypotensive with a blood pressure of 87/49 which improved to 101/58 after fluid resuscitation.  WBCs of 17.4, lactic acid of 2.3 which improved to 2.1 after fluid resuscitation.  COVID-19 and influenza testing were negative.  Chest x-ray showed left lower lobe opacities concerning for pneumonia.  He was started on Rocephin and Zithromax.  Assessment & Plan:   Severe sepsis: Present on admission, possibly from below Community-acquired bacterial pneumonia Possible right lower extremity cellulitis Leukocytosis -Patient presented with generalized weakness and was febrile, hypotensive with leukocytosis and lactic acid of 2.3 which improved with IV fluids and chest x-ray showing possible left lower lobe pneumonia.  He also has possibly right lower extremity cellulitis.  COVID-19 and flu testing were negative -Cultures negative so far. -Blood pressure improving but still on the lower side.  Off IV fluids.  Continue Rocephin and Zithromax. -WBCs improving.  Repeat a.m. labs.  Acute on chronic hypoxic respiratory failure -Probably from pneumonia.  Was requiring up to 6 L nasal cannula oxygen; currently on 3 L oxygen.  Normally wears 3 L oxygen at home.  Paroxysmal A. Fib -Currently rate controlled.  Continue amiodarone, Eliquis and metoprolol  Essential hypertension -Blood pressure improving.  Continue metoprolol.  Diuretics on hold for now  Current diastolic heart  failure -Strict input and output.  Daily weights.  Fluid restriction.  Blood pressure still on the lower side and diuretics are on hold for now.  Morbid obesity-outpatient follow-up   DVT prophylaxis: Eliquis Code Status: Full Family Communication: None at bedside Disposition Plan: Status is: Inpatient  Remains inpatient appropriate because:Inpatient level of care appropriate due to severity of illness   Dispo: The patient is from: Home              Anticipated d/c is to: Home              Anticipated d/c date is: 2 days              Patient currently is not medically stable to d/c.   Difficult to place patient No  Consultants: None  Procedures: None  Antimicrobials: Rocephin and Zithromax from 09/24/20 onwards   Subjective: Patient seen and examined at bedside.  No overnight worsening shortness of breath, fever, abdominal pain or vomiting reported.  Still feels weak and tired and complains of lower extremity swelling. Objective: Vitals:   09/25/20 2123 09/25/20 2309 09/26/20 0430 09/26/20 0538  BP: 113/63 (!) 103/58 104/64   Pulse: 85 80 71   Resp:  18 19   Temp:  98 F (36.7 C) 98.1 F (36.7 C)   TempSrc:  Oral Oral   SpO2:  98% 99%   Weight:    128.5 kg  Height:    5\' 8"  (1.727 m)    Intake/Output Summary (Last 24 hours) at 09/26/2020 0728 Last data filed at 09/26/2020 0436 Gross per 24 hour  Intake 0 ml  Output 200 ml  Net -200 ml   Filed Weights   09/24/20 1402  09/26/20 0538  Weight: 122.5 kg 128.5 kg    Examination:  General exam: No acute distress.  Looks chronically ill.  Currently still on 3 L oxygen via nasal cannula Respiratory system: Decreased breath sounds at bases bilaterally with some crackles intermittently heard Cardiovascular system: Rate controlled, S1-S2 heard Gastrointestinal system: Abdomen is morbidly obese, slightly distended, soft and nontender.  Bowel sounds are heard Extremities: Bilateral lower extremity 1-2+ edema present; no  clubbing Central nervous system: Awake and alert.  No focal neurological deficits. Moving extremities Skin: Bilateral chronic skin changes of lower extremities with venous stasis dermatitis with some erythema and warmth to right foot and right medial thigh area Psychiatry: Affect is flat    Data Reviewed: I have personally reviewed following labs and imaging studies  CBC: Recent Labs  Lab 09/24/20 1310 09/25/20 0359 09/26/20 0425  WBC 17.4* 18.4* 12.6*  NEUTROABS 14.8*  --  8.5*  HGB 12.7* 11.2* 10.3*  HCT 41.6 38.0* 33.1*  MCV 88.5 91.3 89.7  PLT 294 234 494   Basic Metabolic Panel: Recent Labs  Lab 09/24/20 1310 09/25/20 0359 09/26/20 0425  NA 136 136 137  K 3.6 4.5 4.0  CL 101 100 99  CO2 24 28 27   GLUCOSE 149* 112* 106*  BUN 19 20 16   CREATININE 1.08 0.99 0.74  CALCIUM 9.5 9.0 9.4  MG  --   --  1.7   GFR: Estimated Creatinine Clearance: 105.9 mL/min (by C-G formula based on SCr of 0.74 mg/dL). Liver Function Tests: Recent Labs  Lab 09/24/20 1310  AST 30  ALT 18  ALKPHOS 104  BILITOT 0.4  PROT 7.1  ALBUMIN 3.3*   No results for input(s): LIPASE, AMYLASE in the last 168 hours. No results for input(s): AMMONIA in the last 168 hours. Coagulation Profile: Recent Labs  Lab 09/24/20 1310  INR 1.2   Cardiac Enzymes: No results for input(s): CKTOTAL, CKMB, CKMBINDEX, TROPONINI in the last 168 hours. BNP (last 3 results) No results for input(s): PROBNP in the last 8760 hours. HbA1C: No results for input(s): HGBA1C in the last 72 hours. CBG: No results for input(s): GLUCAP in the last 168 hours. Lipid Profile: No results for input(s): CHOL, HDL, LDLCALC, TRIG, CHOLHDL, LDLDIRECT in the last 72 hours. Thyroid Function Tests: No results for input(s): TSH, T4TOTAL, FREET4, T3FREE, THYROIDAB in the last 72 hours. Anemia Panel: No results for input(s): VITAMINB12, FOLATE, FERRITIN, TIBC, IRON, RETICCTPCT in the last 72 hours. Sepsis Labs: Recent Labs   Lab 09/24/20 1340 09/24/20 1404 09/24/20 1622  LATICACIDVEN 2.9* 2.3* 2.1*    Recent Results (from the past 240 hour(s))  Culture, blood (Routine x 2)     Status: None (Preliminary result)   Collection Time: 09/24/20  1:20 PM   Specimen: BLOOD RIGHT HAND  Result Value Ref Range Status   Specimen Description BLOOD RIGHT HAND  Final   Special Requests   Final    BOTTLES DRAWN AEROBIC AND ANAEROBIC Blood Culture results may not be optimal due to an excessive volume of blood received in culture bottles   Culture   Final    NO GROWTH 2 DAYS Performed at [facility], [place] [address], [place], [place] [address]    Report Status PENDING  Incomplete  Culture, blood (Routine x 2)     Status: None (Preliminary result)   Collection Time: 09/24/20  2:04 PM   Specimen: BLOOD RIGHT FOREARM  Result Value Ref Range Status   Specimen Description BLOOD  RIGHT FOREARM  Final   Special Requests   Final    BOTTLES DRAWN AEROBIC AND ANAEROBIC Blood Culture adequate volume   Culture   Final    NO GROWTH 2 DAYS Performed at [facility], [address] [place], [place] [address]    Report Status PENDING  Incomplete  Resp Panel by RT-PCR (Flu A&B, Covid) Nasopharyngeal Swab     Status: None   Collection Time: 09/24/20  3:23 PM   Specimen: Nasopharyngeal Swab; Nasopharyngeal(NP) swabs in vial transport medium  Result Value Ref Range Status   SARS Coronavirus 2 by RT PCR NEGATIVE NEGATIVE Final    Comment: (NOTE) SARS-CoV-2 target nucleic acids are NOT DETECTED.  The SARS-CoV-2 RNA is generally detectable in upper respiratory specimens during the acute phase of infection. The lowest concentration of SARS-CoV-2 viral copies this assay can detect is 138 copies/mL. A negative result does not preclude SARS-Cov-2 infection and should not be used as the sole basis for treatment or other patient management decisions. A negative result may occur with  improper specimen collection/handling,  submission of specimen other than nasopharyngeal swab, presence of viral mutation(s) within the areas targeted by this assay, and inadequate number of viral copies(<138 copies/mL). A negative result must be combined with clinical observations, patient history, and epidemiological information. The expected result is Negative.  Fact Sheet for Patients:  EntrepreneurPulse.com.au  Fact Sheet for Healthcare Providers:  IncredibleEmployment.be  This test is no t yet approved or cleared by the [place] FDA and  has been authorized for detection and/or diagnosis of SARS-CoV-2 by FDA under an Emergency Use Authorization (EUA). This EUA will remain  in effect (meaning this test can be used) for the duration of the COVID-19 declaration under Section 564(b)(1) of the Act, 21 U.S.C.section 360bbb-3(b)(1), unless the authorization is terminated  or revoked sooner.       Influenza A by PCR NEGATIVE NEGATIVE Final   Influenza B by PCR NEGATIVE NEGATIVE Final    Comment: (NOTE) The Xpert Xpress SARS-CoV-2/FLU/RSV plus assay is intended as an aid in the diagnosis of influenza from Nasopharyngeal swab specimens and should not be used as a sole basis for treatment. Nasal washings and aspirates are unacceptable for Xpert Xpress SARS-CoV-2/FLU/RSV testing.  Fact Sheet for Patients: EntrepreneurPulse.com.au  Fact Sheet for Healthcare Providers: IncredibleEmployment.be  This test is not yet approved or cleared by the [place] FDA and has been authorized for detection and/or diagnosis of SARS-CoV-2 by FDA under an Emergency Use Authorization (EUA). This EUA will remain in effect (meaning this test can be used) for the duration of the COVID-19 declaration under Section 564(b)(1) of the Act, 21 U.S.C. section 360bbb-3(b)(1), unless the authorization is terminated or revoked.  Performed at [facility], [place] [address], [place], [place] [address]          Radiology Studies: CT Head Wo Contrast  Result Date: 09/24/2020 CLINICAL DATA:  75 year old male with head trauma. EXAM: CT HEAD WITHOUT CONTRAST TECHNIQUE: Contiguous axial images were obtained from the base of the skull through the vertex without intravenous contrast. COMPARISON:  None. FINDINGS: Brain: There is mild age-related atrophy and moderate chronic microvascular ischemic changes. There is no acute intracranial hemorrhage. No mass effect or midline shift. No extra-axial fluid collection. Vascular: No hyperdense vessel or unexpected calcification. Skull: Normal. Negative for fracture or focal lesion. Sinuses/Orbits: No acute finding. Other: None IMPRESSION: 1. No acute intracranial pathology. 2. Age-related atrophy and chronic microvascular ischemic changes.  Electronically Signed   By: Anner Crete M.D.   On: 09/24/2020 16:10   DG Chest Portable 1 View  Result Date: 09/24/2020 CLINICAL DATA:  Shortness of breath fever and fall EXAM: PORTABLE CHEST 1 VIEW COMPARISON:  Chest radiograph August 01, 2020 FINDINGS: The heart size and mediastinal contours are partially obscured but appears unchanged. Stable to slightly increased left lower lobe opacities. Similar small bilateral pleural effusions. The visualized skeletal structures are unchanged. IMPRESSION: 1. Stable to slightly increased left lower lobe opacities which could reflect atelectasis and/or infection. 2. Similar small bilateral pleural effusions. Electronically Signed   By: Dahlia Bailiff MD   On: 09/24/2020 14:05        Scheduled Meds: . amiodarone  200 mg Oral Daily  . apixaban  5 mg Oral BID  . ferrous sulfate  325 mg Oral Q breakfast  . metoprolol tartrate  50 mg Oral BID   Continuous Infusions: . azithromycin Stopped (09/25/20 1733)  . cefTRIAXone (ROCEPHIN)  IV Stopped (09/25/20 1700)          Aline August, MD [facility] 09/26/2020, 7:28 AM

## 2020-09-26 NOTE — Progress Notes (Signed)
Admitted from ED accompanied by daughter.  Oriented to room and surroundings.

## 2020-09-26 NOTE — Progress Notes (Signed)
Physical Therapy Evaluation Patient Details Name: Shane Diaz MRN: [id number] DOB: [birth year] Today's Date: 09/26/2020   History of Present Illness  Pt is a 75 y.o. male who presents with SOB, fever/chills, fall, generalized weakness, and lower extremity edema. Upon arrival to hospital, pt found to be hypotensive. Pt with sepsis likely due to combination of R lower extremity cellulitis and pneumonia likely gram-positive. CT of head negative for acute intracranial pathology. PMH: OSA, HTN, paroxysmal a-fib, chronic diastolic congestive heart failure, and obesity.  Clinical Impression  Pt presents with condition above and deficits mentioned below, see PT Problem List. PTA, he was independent with all functional mobility without use of AD/AE. His wife would assist with sock donning. Pt has only had 1 fall PTA in which happened recently, per chart review, and pt stated he just lost balance. Pt does display generalized bil lower extremity weakness, bil leg edema (cellulitis in R), and mild balance deficits this date. He was able to ambulate short household distances without AD/AE with only min guard assist for safety today. Pt fatigues secondary to medical conditions, obesity, and bil knee pain (reports it being due to arthritis). See General Comments below for SpO2 changes with supplemental O2 weaning during session. Expect pt to improve with mobility safety and tolerance as he medically improves as well, thus no PT follow up necessary at this time. Will continue to follow acutely though to address his deficits to maximize his safety and independence with all functional mobility prior to d/c.    Follow Up Recommendations No PT follow up    Equipment Recommendations  None recommended by PT    Recommendations for Other Services       Precautions / Restrictions Precautions Precautions: Fall (low risk) Precaution Comments: monitor O2 Restrictions Weight Bearing Restrictions: No      Mobility   Bed Mobility Overal bed mobility: Modified Independent             General bed mobility comments: Pt able to come to sit EOB with HOB elevated safely.    Transfers Overall transfer level: Needs assistance Equipment used: Rolling walker (2 wheeled) Transfers: Sit to/from Stand Sit to Stand: Min guard         General transfer comment: Pt comes to stand pulling up on RW. Cued pt to push up from bed instead. No overt LOB, but mild initial unsteadiness with rise thus min guard for safety.  Ambulation/Gait Ambulation/Gait assistance: Min guard Gait Distance (Feet): 45 Feet (x2 bouts of ~30 ft > ~45 ft) Assistive device: None Gait Pattern/deviations: Step-through pattern;Decreased step length - right;Decreased step length - left;Decreased stride length Gait velocity: reduced Gait velocity interpretation: 1.31 - 2.62 ft/sec, indicative of limited community ambulator General Gait Details: Pt ambulates with weight more on posterior aspects of feet with legs externally rotated, displaying decreased bil step length and height. Slow gait but no overt LOB, min guard for safety.  Stairs            Wheelchair Mobility    Modified Rankin (Stroke Patients Only) Modified Rankin (Stroke Patients Only) Pre-Morbid Rankin Score: No symptoms Modified Rankin: No significant disability     Balance Overall balance assessment: Mild deficits observed, not formally tested                                           Pertinent Vitals/Pain Pain Assessment: 0-10 Pain Score:  9  Pain Location: knees (arthritis) Pain Descriptors / Indicators: Discomfort Pain Intervention(s): Limited activity within patient's tolerance;Monitored during session;Repositioned (edcuated pt on warm-up exercising and use of ice/heat for relief at home)    Home Living Family/patient expects to be discharged to:: Private residence Living Arrangements: Spouse/significant other Available Help at  Discharge: Family;Available 24 hours/day Type of Home: House Home Access: Stairs to enter Entrance Stairs-Rails: Can reach both Entrance Stairs-Number of Steps: 3 Home Layout: One level Home Equipment: Walker - 2 wheels;Cane - quad;Cane - single point;Grab bars - tub/shower;Grab bars - toilet      Prior Function Level of Independence: Independent         Comments: Pt independent with all mobility and ADLs. Pt drives. Pt's wife does donn his sicks for him though. (pt fell for first time recently, just lost balance per pt)     Hand Dominance   Dominant Hand: Right    Extremity/Trunk Assessment   Upper Extremity Assessment Upper Extremity Assessment: Overall WFL for tasks assessed    Lower Extremity Assessment Lower Extremity Assessment: Generalized weakness (MMT scores of 4- to 4+ grossly in bil legs; impaired sensation in bil legs from knee down; gross coordination deficits)    Cervical / Trunk Assessment Cervical / Trunk Assessment: Normal  Communication   Communication: No difficulties  Cognition Arousal/Alertness: Awake/alert Behavior During Therapy: WFL for tasks assessed/performed Overall Cognitive Status: Within Functional Limits for tasks assessed                                 General Comments: A&Ox4. Pt appropriately following commands to maintain safety.      General Comments General comments (skin integrity, edema, etc.): Pitting edema in bil legs, esp on R with erythema; Pt on 3L/min O2 via [place] upon arrival, weaned to RA with mobility with SpO2 >/= 93% throughout, donned  at 1-2L/min O2 end of session    Exercises     Assessment/Plan    PT Assessment Patient needs continued PT services  PT Problem List Decreased strength;Decreased range of motion;Decreased activity tolerance;Decreased balance;Decreased mobility;Decreased coordination;Impaired sensation;Cardiopulmonary status limiting activity;Obesity;Pain       PT Treatment  Interventions Gait training;Stair training;DME instruction;Functional mobility training;Therapeutic activities;Therapeutic exercise;Balance training;Neuromuscular re-education;Patient/family education    PT Goals (Current goals can be found in the Care Plan section)  Acute Rehab PT Goals Patient Stated Goal: to decrease his knee arthritis pain PT Goal Formulation: With patient Time For Goal Achievement: 10/10/20 Potential to Achieve Goals: Good    Frequency Min 3X/week   Barriers to discharge        Co-evaluation               AM-PAC PT "6 Clicks" Mobility  Outcome Measure Help needed turning from your back to your side while in a flat bed without using bedrails?: None Help needed moving from lying on your back to sitting on the side of a flat bed without using bedrails?: None Help needed moving to and from a bed to a chair (including a wheelchair)?: A Little Help needed standing up from a chair using your arms (e.g., wheelchair or bedside chair)?: A Little Help needed to walk in hospital room?: A Little Help needed climbing 3-5 steps with a railing? : A Little 6 Click Score: 20    End of Session Equipment Utilized During Treatment: Gait belt;Oxygen Activity Tolerance: Patient limited by pain (knee pain) Patient left: in  chair;with call bell/phone within reach Nurse Communication: Mobility status;Other (comment) (SpO2 and weaning O2 during session) PT Visit Diagnosis: Unsteadiness on feet (R26.81);History of falling (Z91.81);Muscle weakness (generalized) (M62.81);Other abnormalities of gait and mobility (R26.89);Pain Pain - Right/Left:  (bil) Pain - part of body: Knee    Time: 7615-1834 PT Time Calculation (min) (ACUTE ONLY): 31 min   Charges:   PT Evaluation $PT Eval Moderate Complexity: 1 Mod PT Treatments $Therapeutic Activity: 8-22 mins        Moishe Spice, PT, DPT Acute Rehabilitation Services  Pager: [phone] Office: [phone]   Orvan Falconer 09/26/2020, 10:16 AM

## 2020-09-26 NOTE — Plan of Care (Signed)

## 2020-09-27 DIAGNOSIS — J189 Pneumonia, unspecified organism: Secondary | ICD-10-CM | POA: Diagnosis not present

## 2020-09-27 DIAGNOSIS — L03115 Cellulitis of right lower limb: Secondary | ICD-10-CM | POA: Diagnosis not present

## 2020-09-27 DIAGNOSIS — I10 Essential (primary) hypertension: Secondary | ICD-10-CM | POA: Diagnosis not present

## 2020-09-27 DIAGNOSIS — A419 Sepsis, unspecified organism: Secondary | ICD-10-CM | POA: Diagnosis not present

## 2020-09-27 LAB — BASIC METABOLIC PANEL
Anion gap: 10 (ref 5–15)
BUN: 16 mg/dL (ref 8–23)
CO2: 28 mmol/L (ref 22–32)
Calcium: 9.4 mg/dL (ref 8.9–10.3)
Chloride: 97 mmol/L — ABNORMAL LOW (ref 98–111)
Creatinine, Ser: 0.8 mg/dL (ref 0.61–1.24)
GFR, Estimated: >60 mL/min (ref >60)
Glucose, Bld: 105 mg/dL — ABNORMAL HIGH (ref 70–99)
Potassium: 4.2 mmol/L (ref 3.5–5.1)
Sodium: 135 mmol/L (ref 135–145)

## 2020-09-27 LAB — CBC WITH DIFFERENTIAL/PLATELET
Abs Immature Granulocytes: 0.04 10*3/uL (ref 0.00–0.07)
Basophils Absolute: 0 10*3/uL (ref 0.0–0.1)
Basophils Relative: 0 %
Eosinophils Absolute: 0.2 10*3/uL (ref 0.0–0.5)
Eosinophils Relative: 2 %
HCT: 33.5 % — ABNORMAL LOW (ref 39.0–52.0)
Hemoglobin: 10.6 g/dL — ABNORMAL LOW (ref 13.0–17.0)
Immature Granulocytes: 0 %
Lymphocytes Relative: 24 %
Lymphs Abs: 2.4 10*3/uL (ref 0.7–4.0)
MCH: 28 pg (ref 26.0–34.0)
MCHC: 31.6 g/dL (ref 30.0–36.0)
MCV: 88.6 fL (ref 80.0–100.0)
Monocytes Absolute: 1.1 10*3/uL — ABNORMAL HIGH (ref 0.1–1.0)
Monocytes Relative: 11 %
Neutro Abs: 6.1 10*3/uL (ref 1.7–7.7)
Neutrophils Relative %: 63 %
Platelets: 209 10*3/uL (ref 150–400)
RBC: 3.78 MIL/uL — ABNORMAL LOW (ref 4.22–5.81)
RDW: 16.8 % — ABNORMAL HIGH (ref 11.5–15.5)
WBC: 9.8 10*3/uL (ref 4.0–10.5)
nRBC: 0 % (ref 0.0–0.2)

## 2020-09-27 LAB — BRAIN NATRIURETIC PEPTIDE: B Natriuretic Peptide: 318 pg/mL — ABNORMAL HIGH (ref 0.0–100.0)

## 2020-09-27 LAB — MAGNESIUM: Magnesium: 1.6 mg/dL — ABNORMAL LOW (ref 1.7–2.4)

## 2020-09-27 MED ORDER — FUROSEMIDE 10 MG/ML IJ SOLN
40.0000 mg | Freq: Two times a day (BID) | INTRAMUSCULAR | Status: DC
  Administered 2020-09-27 – 2020-09-29 (×5): 40 mg via INTRAVENOUS
  Filled 2020-09-27 (×6): qty 4

## 2020-09-27 MED ORDER — MAGNESIUM SULFATE 2 GM/50ML IV SOLN
2.0000 g | Freq: Once | INTRAVENOUS | Status: AC
  Administered 2020-09-27: 2 g via INTRAVENOUS
  Filled 2020-09-27: qty 50

## 2020-09-27 MED ORDER — VANCOMYCIN HCL 10 G IV SOLR
2500.0000 mg | Freq: Once | INTRAVENOUS | Status: AC
  Administered 2020-09-27: 2500 mg via INTRAVENOUS
  Filled 2020-09-27 (×2): qty 2500

## 2020-09-27 MED ORDER — VANCOMYCIN HCL 2000 MG/400ML IV SOLN
2000.0000 mg | INTRAVENOUS | Status: DC
  Administered 2020-09-28 – 2020-10-01 (×4): 2000 mg via INTRAVENOUS
  Filled 2020-09-27 (×5): qty 400

## 2020-09-27 NOTE — Plan of Care (Signed)
?  Problem: Clinical Measurements: ?Goal: Ability to avoid or minimize complications of infection will improve ?Outcome: Progressing ?  ?Problem: Skin Integrity: ?Goal: Skin integrity will improve ?Outcome: Progressing ?  ?

## 2020-09-27 NOTE — Progress Notes (Signed)
Physical Therapy Treatment Patient Details Name: Shane Diaz MRN: [id number] DOB: Jun 14, 1946 Today's Date: 09/27/2020    History of Present Illness Pt is a 75 y.o. male who presents with SOB, fever/chills, fall, generalized weakness, and lower extremity edema. Upon arrival to hospital, pt found to be hypotensive. Pt with sepsis likely due to combination of R lower extremity cellulitis and pneumonia likely gram-positive. CT of head negative for acute intracranial pathology. PMH: OSA, HTN, paroxysmal a-fib, chronic diastolic congestive heart failure, and obesity.    PT Comments    Pt denying to sit up or get OOB today due to increased edema and pain in his bil lower legs. Thus, focused session on maintaining leg strength and decreasing lower extremity edema through utilizing muscles to pump fluid back towards heart, see Exercises below. Pt educated on importance of continual mobility to prevent muscle atrophy while in hospital, use of leg muscles for edema control, and elevation of legs with ankle pumps for edema management. Will continue to follow acutely.   Follow Up Recommendations  No PT follow up (may change if pt's mobility declines)     Equipment Recommendations  None recommended by PT    Recommendations for Other Services       Precautions / Restrictions Precautions Precautions: Fall (low risk) Precaution Comments: monitor O2 Restrictions Weight Bearing Restrictions: No    Mobility  Bed Mobility               General bed mobility comments: Pt denied today due to leg pain.    Transfers                 General transfer comment: Pt denied today due to leg pain.  Ambulation/Gait             General Gait Details: Pt denied today due to leg pain.   Stairs             Wheelchair Mobility    Modified Rankin (Stroke Patients Only) Modified Rankin (Stroke Patients Only) Pre-Morbid Rankin Score: No symptoms Modified Rankin: No significant  disability     Balance                                            Cognition Arousal/Alertness: Awake/alert Behavior During Therapy: WFL for tasks assessed/performed Overall Cognitive Status: Within Functional Limits for tasks assessed                                        Exercises General Exercises - Lower Extremity Ankle Circles/Pumps: 20 reps;Supine;Both (x2 sets of ~20-30 reps) Quad Sets: Both;10 reps;Supine Gluteal Sets: Both;10 reps;Supine Short Arc Quad: Both;10 reps;Supine Hip ABduction/ADduction: Both;10 reps;Supine (isometric hip abd and add)    General Comments General comments (skin integrity, edema, etc.): Socks donned upon arrival but causing deep marks in skin thus doffed; re-donned prevalon boots and elevated legs end of session      Pertinent Vitals/Pain Pain Assessment: Faces Faces Pain Scale: Hurts whole lot Pain Location: bil lower legs Pain Descriptors / Indicators: Discomfort;Grimacing;Guarding;Moaning Pain Intervention(s): Limited activity within patient's tolerance;Monitored during session;Repositioned    Home Living                      Prior Function  PT Goals (current goals can now be found in the care plan section) Acute Rehab PT Goals Patient Stated Goal: to decrease his pain PT Goal Formulation: With patient Time For Goal Achievement: 10/10/20 Potential to Achieve Goals: Good Progress towards PT goals: Not progressing toward goals - comment (pain limiting pt from getting OOB today)    Frequency    Min 3X/week      PT Plan Current plan remains appropriate    Co-evaluation              AM-PAC PT "6 Clicks" Mobility   Outcome Measure  Help needed turning from your back to your side while in a flat bed without using bedrails?: None Help needed moving from lying on your back to sitting on the side of a flat bed without using bedrails?: None Help needed moving to and  from a bed to a chair (including a wheelchair)?: A Little Help needed standing up from a chair using your arms (e.g., wheelchair or bedside chair)?: A Little Help needed to walk in hospital room?: A Little Help needed climbing 3-5 steps with a railing? : A Little 6 Click Score: 20    End of Session Equipment Utilized During Treatment: Oxygen Activity Tolerance: Patient limited by pain Patient left: in bed;with call bell/phone within reach;with bed alarm set   PT Visit Diagnosis: Unsteadiness on feet (R26.81);History of falling (Z91.81);Muscle weakness (generalized) (M62.81);Other abnormalities of gait and mobility (R26.89);Pain Pain - Right/Left:  (bil) Pain - part of body: Leg     Time: 8280-0349 PT Time Calculation (min) (ACUTE ONLY): 15 min  Charges:  $Therapeutic Exercise: 8-22 mins                     Moishe Spice, PT, DPT Acute Rehabilitation Services  Pager: [phone] Office: [phone]    Orvan Falconer 09/27/2020, 5:15 PM

## 2020-09-27 NOTE — Progress Notes (Signed)
Pharmacy Antibiotic Note  Shane Diaz is a 75 y.o. male on day #4 Ceftriaxone and Azithromcyin for CAP and bilateral LE cellultits and sepsis coverage.  Pharmacy has been consulted for Vancomycin dosing for worsening cellultitis.  Plan: Vanc 2500 mg IV x 1 loading dose today Vancomycin 2gm IV Q 24 hrs Goal AUC 400-550. Expected AUC: 448 Will follow renal function, progress and antibiotic plans. Azithromycin and Ceftriaxone stop date are in place. Azithromycin 500 mg IV q24h thru 2/26. Ceftriaxone 2 gm IV q24h thru 2/28.   Height: 5\' 8"  (172.7 cm) Weight: 128.5 kg (283 lb 4.7 oz) IBW/kg (Calculated) : 68.4  Temp (24hrs), Avg:98.7 F (37.1 C), Min:98.6 F (37 C), Max:98.7 F (37.1 C)  Recent Labs  Lab 09/24/20 1310 09/24/20 1340 09/24/20 1404 09/24/20 1622 09/25/20 0359 09/26/20 0425 09/27/20 0304  WBC 17.4*  --   --   --  18.4* 12.6* 9.8  CREATININE 1.08  --   --   --  0.99 0.74 0.80  LATICACIDVEN  --  2.9* 2.3* 2.1*  --   --   --     Estimated Creatinine Clearance: 105.9 mL/min (by C-G formula based on SCr of 0.8 mg/dL).    No Known Allergies  Antimicrobials this admission: Azithromycin 2/21 >> (2/26) Ceftriaxone 2/21 >> (2/28) Vancomycin 2/24 >>  Dose adjustments this admission: n/a  Microbiology results: 2/21 blood x 2: no growth x 3 days to date 2/21 COVID and flu: negative  Thank you for allowing pharmacy to be a part of this patient's care.  Arty Baumgartner, [place] 09/27/2020 11:05 AM

## 2020-09-27 NOTE — Plan of Care (Signed)

## 2020-09-27 NOTE — Progress Notes (Signed)
Patient ID: Shane Diaz, male   DOB: 16-Apr-1946, 75 y.o.   MRN: [id number]  PROGRESS NOTE    Shane Diaz  GYK:[id number] DOB: 1946/07/05 DOA: 09/24/2020 PCP: [name], MD   Brief Narrative:  75 year old male with history of hypertension, hyperlipidemia, obstructive sleep apnea, chronic hypoxic respiratory failure on 3 L oxygen via nasal cannula at home, paroxysmal A. fib, chronic diastolic CHF, obesity presented with generalized weakness or shortness of breath and fever.  Presentation, he was febrile to 103.1 and initially hypotensive with a blood pressure of 87/49 which improved to 101/58 after fluid resuscitation.  WBCs of 17.4, lactic acid of 2.3 which improved to 2.1 after fluid resuscitation.  COVID-19 and influenza testing were negative.  Chest x-ray showed left lower lobe opacities concerning for pneumonia.  He was started on Rocephin and Zithromax.  Assessment & Plan:   Severe sepsis: Present on admission, possibly from below Community-acquired bacterial pneumonia Bilateral lower extremity cellulitis Leukocytosis -Patient presented with generalized weakness and was febrile, hypotensive with leukocytosis and lactic acid of 2.3 which improved with IV fluids and chest x-ray showing possible left lower lobe pneumonia.  He also has bilateral lower extremity cellulitis.  COVID-19 and flu testing were negative -Cultures negative so far. -Blood pressure much improved.  Off IV fluids.  Continue Rocephin and Zithromax.  Will add vancomycin because of worsening cellulitis and lower extremity pain -WBCs normalized.  Repeat a.m. labs.  Acute on chronic hypoxic respiratory failure -Probably from pneumonia.  Was requiring up to 6 L nasal cannula oxygen; currently intermittently on room air or 1 L nasal cannula oxygen.  Paroxysmal A. Fib -Currently rate controlled.  Continue amiodarone, Eliquis and metoprolol  Essential hypertension -Blood pressure improving.  Continue metoprolol.  Lasix  resumed on 7/93/9030  Chronic diastolic heart failure -Strict input and output.  Daily weights.  Fluid restriction.  Patient is requesting evaluation by Dr. Sallyanne Kuster who is his regular cardiologist.  Cardiology team notified.  Switch Lasix to 40 mg IV every 12 hours.  Morbid obesity-outpatient follow-up   DVT prophylaxis: Eliquis Code Status: Full Family Communication: None at bedside Disposition Plan: Status is: Inpatient  Remains inpatient appropriate because:Inpatient level of care appropriate due to severity of illness   Dispo: The patient is from: Home              Anticipated d/c is to: Home              Anticipated d/c date is: 1 day              Patient currently is not medically stable to d/c.   Difficult to place patient No  Consultants: None  Procedures: None  Antimicrobials: Rocephin and Zithromax from 09/24/20 onwards   Subjective: Patient seen and examined at bedside.  Does not feel well and feels that his legs are more swollen and right lower extremity is more painful.  Denies worsening shortness of breath, fever, cough or vomiting.   Objective: Vitals:   09/27/20 0049 09/27/20 0410 09/27/20 0433 09/27/20 0757  BP: (!) 112/59 134/72  129/69  Pulse: 70 77  80  Resp: 18 18  18   Temp: 98.7 F (37.1 C) 98.6 F (37 C)  98.7 F (37.1 C)  TempSrc: Oral Oral  Oral  SpO2: 94% 95%  95%  Weight:   128.5 kg   Height:        Intake/Output Summary (Last 24 hours) at 09/27/2020 1019 Last data filed at 09/27/2020 0600 Gross per 24  hour  Intake 600 ml  Output 1200 ml  Net -600 ml   Filed Weights   09/24/20 1402 09/26/20 0538 09/27/20 0433  Weight: 122.5 kg 128.5 kg 128.5 kg    Examination:  General exam: Mild distress secondary lower extremity pain.  Looks chronically ill.  Currently on room air.   Respiratory system: Bilateral decreased breath sounds at bases with scattered crackles, no wheezing  cardiovascular system: S1-S2 heard, rate controlled   gastrointestinal system: Abdomen is morbidly obese, distended, soft and nontender.  Normal bowel sounds heard  extremities: No cyanosis.  Bilateral lower extremity 2+ edema present  Central nervous system: Awake and alert.  No focal neurological deficits. Moving extremities Skin: Bilateral chronic skin changes of lower extremities with venous stasis dermatitis with increasing erythema and warmth of bilateral lower extremities.   Psychiatry: flat affect   Data Reviewed: I have personally reviewed following labs and imaging studies  CBC: Recent Labs  Lab 09/24/20 1310 09/25/20 0359 09/26/20 0425 09/27/20 0304  WBC 17.4* 18.4* 12.6* 9.8  NEUTROABS 14.8*  --  8.5* 6.1  HGB 12.7* 11.2* 10.3* 10.6*  HCT 41.6 38.0* 33.1* 33.5*  MCV 88.5 91.3 89.7 88.6  PLT 294 234 207 185   Basic Metabolic Panel: Recent Labs  Lab 09/24/20 1310 09/25/20 0359 09/26/20 0425 09/27/20 0304  NA 136 136 137 135  K 3.6 4.5 4.0 4.2  CL 101 100 99 97*  CO2 24 28 27 28   GLUCOSE 149* 112* 106* 105*  BUN 19 20 16 16   CREATININE 1.08 0.99 0.74 0.80  CALCIUM 9.5 9.0 9.4 9.4  MG  --   --  1.7 1.6*   GFR: Estimated Creatinine Clearance: 105.9 mL/min (by C-G formula based on SCr of 0.8 mg/dL). Liver Function Tests: Recent Labs  Lab 09/24/20 1310  AST 30  ALT 18  ALKPHOS 104  BILITOT 0.4  PROT 7.1  ALBUMIN 3.3*   No results for input(s): LIPASE, AMYLASE in the last 168 hours. No results for input(s): AMMONIA in the last 168 hours. Coagulation Profile: Recent Labs  Lab 09/24/20 1310  INR 1.2   Cardiac Enzymes: No results for input(s): CKTOTAL, CKMB, CKMBINDEX, TROPONINI in the last 168 hours. BNP (last 3 results) No results for input(s): PROBNP in the last 8760 hours. HbA1C: No results for input(s): HGBA1C in the last 72 hours. CBG: No results for input(s): GLUCAP in the last 168 hours. Lipid Profile: No results for input(s): CHOL, HDL, LDLCALC, TRIG, CHOLHDL, LDLDIRECT in the last 72  hours. Thyroid Function Tests: No results for input(s): TSH, T4TOTAL, FREET4, T3FREE, THYROIDAB in the last 72 hours. Anemia Panel: No results for input(s): VITAMINB12, FOLATE, FERRITIN, TIBC, IRON, RETICCTPCT in the last 72 hours. Sepsis Labs: Recent Labs  Lab 09/24/20 1340 09/24/20 1404 09/24/20 1622  LATICACIDVEN 2.9* 2.3* 2.1*    Recent Results (from the past 240 hour(s))  Culture, blood (Routine x 2)     Status: None (Preliminary result)   Collection Time: 09/24/20  1:20 PM   Specimen: BLOOD RIGHT HAND  Result Value Ref Range Status   Specimen Description BLOOD RIGHT HAND  Final   Special Requests   Final    BOTTLES DRAWN AEROBIC AND ANAEROBIC Blood Culture results may not be optimal due to an excessive volume of blood received in culture bottles   Culture   Final    NO GROWTH 3 DAYS Performed at [facility], [place] [address], [place], [place] [address]  Report Status PENDING  Incomplete  Culture, blood (Routine x 2)     Status: None (Preliminary result)   Collection Time: 09/24/20  2:04 PM   Specimen: BLOOD RIGHT FOREARM  Result Value Ref Range Status   Specimen Description BLOOD RIGHT FOREARM  Final   Special Requests   Final    BOTTLES DRAWN AEROBIC AND ANAEROBIC Blood Culture adequate volume   Culture   Final    NO GROWTH 3 DAYS Performed at [facility], [address] [place], [place] [address]    Report Status PENDING  Incomplete  Resp Panel by RT-PCR (Flu A&B, Covid) Nasopharyngeal Swab     Status: None   Collection Time: 09/24/20  3:23 PM   Specimen: Nasopharyngeal Swab; Nasopharyngeal(NP) swabs in vial transport medium  Result Value Ref Range Status   SARS Coronavirus 2 by RT PCR NEGATIVE NEGATIVE Final    Comment: (NOTE) SARS-CoV-2 target nucleic acids are NOT DETECTED.  The SARS-CoV-2 RNA is generally detectable in upper respiratory specimens during the acute phase of infection. The lowest concentration of SARS-CoV-2 viral copies this  assay can detect is 138 copies/mL. A negative result does not preclude SARS-Cov-2 infection and should not be used as the sole basis for treatment or other patient management decisions. A negative result may occur with  improper specimen collection/handling, submission of specimen other than nasopharyngeal swab, presence of viral mutation(s) within the areas targeted by this assay, and inadequate number of viral copies(<138 copies/mL). A negative result must be combined with clinical observations, patient history, and epidemiological information. The expected result is Negative.  Fact Sheet for Patients:  EntrepreneurPulse.com.au  Fact Sheet for Healthcare Providers:  IncredibleEmployment.be  This test is no t yet approved or cleared by the [place] FDA and  has been authorized for detection and/or diagnosis of SARS-CoV-2 by FDA under an Emergency Use Authorization (EUA). This EUA will remain  in effect (meaning this test can be used) for the duration of the COVID-19 declaration under Section 564(b)(1) of the Act, 21 U.S.C.section 360bbb-3(b)(1), unless the authorization is terminated  or revoked sooner.       Influenza A by PCR NEGATIVE NEGATIVE Final   Influenza B by PCR NEGATIVE NEGATIVE Final    Comment: (NOTE) The Xpert Xpress SARS-CoV-2/FLU/RSV plus assay is intended as an aid in the diagnosis of influenza from Nasopharyngeal swab specimens and should not be used as a sole basis for treatment. Nasal washings and aspirates are unacceptable for Xpert Xpress SARS-CoV-2/FLU/RSV testing.  Fact Sheet for Patients: EntrepreneurPulse.com.au  Fact Sheet for Healthcare Providers: IncredibleEmployment.be  This test is not yet approved or cleared by the [place] FDA and has been authorized for detection and/or diagnosis of SARS-CoV-2 by FDA under an Emergency Use Authorization (EUA). This EUA will  remain in effect (meaning this test can be used) for the duration of the COVID-19 declaration under Section 564(b)(1) of the Act, 21 U.S.C. section 360bbb-3(b)(1), unless the authorization is terminated or revoked.  Performed at [facility], [place] [address], [place], [place] [address]          Radiology Studies: No results found.      Scheduled Meds: . amiodarone  200 mg Oral Daily  . apixaban  5 mg Oral BID  . ferrous sulfate  325 mg Oral Q breakfast  . furosemide  40 mg Oral Daily  . metoprolol tartrate  50 mg Oral BID   Continuous Infusions: . azithromycin 500 mg (09/26/20 1454)  .  cefTRIAXone (ROCEPHIN)  IV 2 g (09/26/20 1408)          Aline August, MD [facility] 09/27/2020, 10:19 AM

## 2020-09-27 NOTE — Consult Note (Signed)
Cardiology Consultation:   Patient ID: Shane Diaz; [id number]; [birth year]   Admit date: 09/24/2020 Date of Consult: 09/27/2020  Primary Care Provider: [name], MD Primary Cardiologist: Sanda Klein, MD  C. Sarajane Jews [facility], 08/22/2020 Primary Electrophysiologist:  None   Patient Profile:   Shane Diaz is a 75 y.o. 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  History of Present Illness:   Shane Diaz was admitted 05/2020 w/ Covid PNA Admitted 12/01-12/07/2020 for CHF, anemia, Afib s/p TEE/DCCV w/ EF 45-50% Admitted 12/29-12/31/2021 for CHF exacerbation, d/c wt 287 lbs, about 20 lbs above dry wt but pt insisted on leaving. DCCV x 4 failed >> amio started, for DCCV in a month. Office visit 08/22/2020, wt 271 lbs, + LE edema (chronic), pt admits to sodium indiscretion, still in Afib, no doses Eliquis missed but pt refused DCCV  Admitted 02/21 with hypotension not responsive to IVF, temp 103.1, -Covid, admitted for sepsis 2nd RLE cellulitis and LLL PNA, ++LE edema. Lasix held for a few days, then restarted when BP improved. 02/24, Lasix changed to IV. Cards asked to see.  Shane Diaz feels he is breathing pretty well today.  He feels he is breathing better today than he was before he came in the hospital.  He thinks his weight before he came in the hospital was about 270 pounds.  Current weight 283 pounds, but this is a bed weight.  His legs are in padded boots and are elevated.  His legs and feet have not been the small in a long time.  He is no longer running any fevers.  He generally feels much better.  He did not realize he was in a regular rhythm.   He did not really feel the atrial fibrillation when he had it.  He wants to get up and walk, but because of the cellulitis and swelling, has not been out of bed.  There is some energy in his voice, however he still seems to get some shortness of breath with conversation.   Past Medical History:  Diagnosis Date  . Acute diastolic heart failure ([place]) 08/03/2020  . Arthritis   . Diverticulosis 05/2015   Mild, noted on colonosocpy  . Fatty liver 05/14/2015   noted on [place] ABD  . History of colon polyps 05/2014   sessile in ascending colon, pedunculated in sigmoid colon, diverticulosis  . Hyperlipidemia    pt denies  . Hypertension    not currently taking medications per MD  . OSA (obstructive sleep apnea) 08/03/2020  . 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 Umbilical hernia   . Varicose veins     Past Surgical History:  Procedure Laterality Date  . APPENDECTOMY     childhood  . BIOPSY  07/11/2020   Procedure: BIOPSY;  Surgeon: Ladene Artist, MD;  Location: [facility];  Service: Endoscopy;;  . CARDIOVERSION N/A 07/13/2020   Procedure: CARDIOVERSION;  Surgeon: Josue Hector, MD;  Location: [facility] ENDOSCOPY;  Service: Cardiovascular;  Laterality: N/A;  . CARDIOVERSION N/A 08/02/2020   Procedure: CARDIOVERSION;  Surgeon: Werner Lean, MD;  Location: [facility] ENDOSCOPY;  Service: Cardiovascular;  Laterality: N/A;  . COLONOSCOPY W/ POLYPECTOMY  05/23/2014  . COLONOSCOPY WITH PROPOFOL N/A 07/11/2020   Procedure: COLONOSCOPY WITH PROPOFOL;  Surgeon: Ladene Artist, MD;  Location[facility]COPY;  Service: Endoscopy;  Laterality: N/A;  . ESOPHAGOGASTRODUODENOSCOPY (EGD) WITH PROPOFOL N/A 07/11/2020   Procedure: ESOPHAGOGASTRODUODENOSCOPY (EGD) WITH PROPOFOL;  Surgeon: Ladene Artist, MD;  Location: [facility] ENDOSCOPY;  Service: Endoscopy;  Laterality: N/A;  . TEE WITHOUT CARDIOVERSION N/A 07/13/2020   Procedure: TRANSESOPHAGEAL ECHOCARDIOGRAM (TEE);  Surgeon: Josue Hector, MD;  Location: [facility] END[facility]COPY;  Service: Cardiovascular;  Laterality: N/A;  . UMBILICAL HERNIA REPAIR N/A 04/27/2018   Procedure: [place];  Surgeon: Alphonsa Overall, MD;  Location: [facility] ORS;  Service: General;  Laterality: N/A;  . varicose veins    . WRIST SURGERY       Prior to Admission medications   Medication Sig Start Date End Date Taking? Authorizing Provider  amiodarone (PACERONE) 200 MG tablet Take 200 mg by mouth daily.   Yes [provider]  apixaban (ELIQUIS) 5 MG TABS tablet Take 1 tablet (5 mg total) by mouth 2 (two) times daily. 06/01/19  Yes [name], [name], MD  furosemide (LASIX) 40 MG tablet Take 40 mg by mouth daily.   Yes [provider]  metoprolol tartrate (LOPRESSOR) 100 MG tablet Take 1 tablet (100 mg total) by mouth 2 (two) times daily. Patient taking differently: Take 100 mg by mouth daily. 08/03/20  Yes Isaiah Serge, NP  nitroGLYCERIN (NITROSTAT) 0.4 MG SL tablet Place 1 tablet (0.4 mg total) under the tongue every 5 (five) minutes x 3 doses as needed for chest pain. 08/03/20  Yes Isaiah Serge, NP  vitamin B-12 (CYANOCOBALAMIN) 1000 MCG tablet Take 1,000 mcg by mouth daily.   Yes [provider]  atorvastatin (LIPITOR) 40 MG tablet Take 1 tablet (40 mg total) by mouth daily. Patient not taking: Reported on 09/24/2020 07/14/20 07/14/21  George Hugh, MD  ferrous sulfate 325 (65 FE) MG tablet Take 1 tablet (325 mg total) by mouth 2 (two) times daily with a meal. 07/14/20 08/13/20  George Hugh, MD  pantoprazole (PROTONIX) 40 MG tablet Take 1 tablet (40 mg total) by mouth daily. Patient not taking: Reported on 09/24/2020 05/10/20   Elgergawy, Silver Huguenin, MD    Inpatient Medications: Scheduled Meds: . amiodarone  200 mg Oral Daily  . apixaban  5 mg Oral BID  . ferrous sulfate  325 mg Oral Q breakfast  . furosemide  40 mg Intravenous Q12H  . metoprolol tartrate  50 mg Oral BID   Continuous Infusions: .  azithromycin 500 mg (09/27/20 1353)  . cefTRIAXone (ROCEPHIN)  IV 2 g (09/27/20 1319)  . vancomycin    . [START ON 09/28/2020] vancomycin     PRN Meds: acetaminophen **OR** acetaminophen, albuterol, ondansetron **OR** ondansetron (ZOFRAN) IV, polyethylene glycol  Allergies:   No Known Allergies  Social History:   Social History   Socioeconomic History  . Marital status: Married    Spouse name: Not on file  . Number of children: 0  . Years of education: Not on file  . Highest education level: Not on file  Occupational History  . Not on file  Tobacco Use  . Smoking status: Never Smoker  . Smokeless tobacco: Never Used  Vaping Use  . Vaping Use: Never used  Substance and Sexual Activity  . Alcohol use: Yes    Alcohol/week: 0.0 standard drinks    Comment: occassional wine, martini  . Drug use: No  .  Sexual activity: Not on file  Other Topics Concern  . Not on file  Social History Narrative  . Not on file   Social Determinants of Health   Financial Resource Strain: Not on file  Food Insecurity: Not on file  Transportation Needs: Not on file  Physical Activity: Not on file  Stress: Not on file  Social Connections: Not on file  Intimate Partner Violence: Not on file    Family History:   Family History  Problem Relation Age of Onset  . Colon cancer Neg Hx   . Esophageal cancer Neg Hx   . Pancreatic cancer Neg Hx   . Prostate cancer Neg Hx   . Rectal cancer Neg Hx   . Stomach cancer Neg Hx    Family Status:  Family Status  Relation Name Status  . Father  Deceased  . Mother  Deceased  . Neg Hx  (Not Specified)    ROS:  Please see the history of present illness.  All other ROS reviewed and negative.     Physical Exam/Data:   Vitals:   09/27/20 0410 09/27/20 0433 09/27/20 0757 09/27/20 1115  BP: 134/72  129/69 124/69  Pulse: 77  80 68  Resp: 18  18 18   Temp: 98.6 F (37 C)  98.7 F (37.1 C) 99 F (37.2 C)  TempSrc: Oral  Oral Oral  SpO2: 95%  95%  97%  Weight:  128.5 kg    Height:        Intake/Output Summary (Last 24 hours) at 09/27/2020 1444 Last data filed at 09/27/2020 1348 Gross per 24 hour  Intake 360 ml  Output 1950 ml  Net -1590 ml    Last 3 Weights 09/27/2020 09/26/2020 09/24/2020  Weight (lbs) 283 lb 4.7 oz 283 lb 4.7 oz 270 lb  Weight (kg) 128.5 kg 128.5 kg 122.471 kg     Body mass index is 43.07 kg/m.   General:  Well nourished, morbidly obese, male in no acute distress HEENT: normal Lymph: no adenopathy Neck: JVD -not able to assess due to body habitus Endocrine:  No thryomegaly Vascular: No carotid bruits; upper extremity pulses 2+  Cardiac:  normal S1, S2; RRR; no murmur Lungs: Decreased breath sounds bases bilaterally, no wheezing, rhonchi, few rales  Abd: soft, nontender, no hepatomegaly  Ext: Pedal edema noted.  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 TEE with no RAA/LAA thrombus DCC x 1 with 200J converted from atrial  flutter rate 133 bpm to NSR rate 88 bpm On eliquis with no immediate  neurologic sequelae.  2. EF decreased with patient in rapid atrial flutter rate 133. Left  ventricular ejection fraction, by estimation, is 45 to 50%. The left  ventricle has mildly decreased function. There is mild left ventricular  hypertrophy.  3. Right ventricular systolic function is severely reduced. The right  ventricular size is severely enlarged.  4. Left atrial size was moderately dilated. No left atrial/left atrial  appendage thrombus was  detected.  5. Right atrial size was severely dilated.  6. The mitral valve is  normal in structure. Mild mitral valve  regurgitation.  7. Tricuspid valve regurgitation is moderate.  8. The aortic valve is tricuspid. Aortic valve regurgitation is mild.  Mild aortic valve sclerosis is present, with no evidence of aortic valve  stenosis.   ECHO: 07/11/2020 1. Left ventricular ejection fraction, by estimation, is 50 to 55%. The  left ventricle has low normal function. The left ventricle has no regional  wall motion abnormalities. There is moderate left ventricular hypertrophy.  Left ventricular diastolic  parameters are indeterminate.  2. Right ventricular systolic function is mildly reduced. The right  ventricular size is mildly enlarged. There is mildly elevated pulmonary  artery systolic pressure. The estimated right ventricular systolic  pressure is 01.0 mmHg.  3. Right atrial size was mildly dilated.  4. The mitral valve is normal in structure. Trivial mitral valve  regurgitation.  5. The aortic valve was not well visualized. Aortic valve regurgitation  is not visualized. No aortic stenosis is present.  6. Aortic dilatation noted. There is mild dilatation of the ascending  aorta, measuring 38 mm.  7. The inferior vena cava is dilated in size with <50% respiratory  variability, suggesting right atrial pressure of 15 mmHg.   Laboratory Data:   Chemistry Recent Labs  Lab 09/25/20 0359 09/26/20 0425 09/27/20 0304  NA 136 137 135  K 4.5 4.0 4.2  CL 100 99 97*  CO2 28 27 28   GLUCOSE 112* 106* 105*  BUN 20 16 16   CREATININE 0.99 0.74 0.80  CALCIUM 9.0 9.4 9.4  GFRNONAA >60 >60 >60  ANIONGAP 8 11 10     Lab Results  Component Value Date   ALT 18 09/24/2020   AST 30 09/24/2020   ALKPHOS 104 09/24/2020   BILITOT 0.4 09/24/2020   Hematology Recent Labs  Lab 09/25/20 0359 09/26/20 0425 09/27/20 0304  WBC 18.4* 12.6* 9.8  RBC 4.16* 3.69* 3.78*  HGB 11.2*  10.3* 10.6*  HCT 38.0* 33.1* 33.5*  MCV 91.3 89.7 88.6  MCH 26.9 27.9 28.0  MCHC 29.5* 31.1 31.6  RDW 17.1* 17.0* 16.8*  PLT 234 207 209   Cardiac Enzymes High Sensitivity Troponin:  No results for input(s): TROPONINIHS in the last 720 hours.    TSH:  Lab Results  Component Value Date   TSH 3.690 05/08/2020   Lipids: Lab Results  Component Value Date   CHOL 103 05/08/2020   HDL 27 (L) 05/08/2020   LDLCALC 62 05/08/2020   TRIG 71 05/08/2020   CHOLHDL 3.8 05/08/2020   HgbA1c: Lab Results  Component Value Date   HGBA1C 5.8 (H) 05/08/2020   Magnesium:  Magnesium  Date Value Ref Range Status  09/27/2020 1.6 (L) 1.7 - 2.4 mg/dL Final    Comment:    Performed at [facility], [place] [address] [place], [place] [address]     Radiology/Studies:  CT Head Wo Contrast  Result Date: 09/24/2020 CLINICAL DATA:  75 year old male with head trauma. EXAM: CT HEAD WITHOUT CONTRAST TECHNIQUE: Contiguous axial images were obtained from the base of the skull through the vertex without intravenous contrast. COMPARISON:  None. FINDINGS: Brain: There is mild age-related atrophy and moderate chronic microvascular ischemic changes. There is no acute intracranial hemorrhage. No mass effect or midline shift. No extra-axial fluid collection. Vascular: No hyperdense vessel or unexpected calcification. Skull: Normal. Negative for fracture or focal lesion. Sinuses/Orbits: No acute finding. Other: None IMPRESSION: 1. No acute intracranial pathology. 2. Age-related atrophy and chronic microvascular ischemic changes. Electronically Signed  By: Anner Crete M.D.   On: 09/24/2020 16:10   DG Chest Portable 1 View  Result Date: 09/24/2020 CLINICAL DATA:  Shortness of breath fever and fall EXAM: PORTABLE CHEST 1 VIEW COMPARISON:  Chest radiograph August 01, 2020 FINDINGS: The heart size and mediastinal contours are partially obscured but appears unchanged. Stable to slightly increased left lower lobe  opacities. Similar small bilateral pleural effusions. The visualized skeletal structures are unchanged. IMPRESSION: 1. Stable to slightly increased left lower lobe opacities which could reflect atelectasis and/or infection. 2. Similar small bilateral pleural effusions. Electronically Signed   By: Dahlia Bailiff MD   On: 09/24/2020 14:05    Assessment and Plan:   1. Diastolic CHF: -Chest x-ray on admission showing slightly increased left lower lobe opacity, atelectasis/infection and small bilateral pleural effusions. -He reported his weight is 270 pounds prior to admission, but it is not clear how much he actually weighed. -Current weight is 283 pounds -Intake/output since admission is incomplete, but has had good urine output in the last 24 hours -At office visit 1 month prior to admission, weight was 270 pounds -Continue Lasix 40 mg IV every 12 hours for now -Follow daily BMET, continue intake/output and daily weights  2.  Persistent atrial fibrillation -At some point prior to admission, he spontaneously converted to sinus rhythm, he does not know when -Feel the atrial fibrillation likely contributed to his heart failure, so he definitely benefits from maintaining sinus rhythm -Continue amiodarone and Eliquis -The TSH is over 6 months old, recheck  3. Sepsis 2nd cellulitis and  PNA -Patient is now afebrile, white count has improved, and oxygen saturation is greater than 90% on room air. -Complete antibiotics and further management per IM  4.  OSA, not on CPAP -he has not had a sleep study yet, it was ordered but never completed -It was reordered at office visit 1/19 -Emphasized need to complete this  Otherwise, per IM Principal Problem:   Sepsis ([place]) Active Problems:   Essential hypertension   A-fib ([facility])   OSA (obstructive sleep apnea)   Cellulitis of leg, right   Pneumonia due to infectious organism   Obesity, Class III, BMI 40-49.9 (morbid obesity) ([place])   Lactic  acidosis   Sepsis due to pneumonia [facility])     For questions or updates, please contact [facility] Please consult [email or web address] for contact info under Cardiology/STEMI.   Jonetta Speak, PA-C  09/27/2020 2:44 PM

## 2020-09-27 NOTE — Progress Notes (Signed)
Orthopedic Tech Progress Note Patient Details:  Breylen Agyeman 1945/12/08 [id number] Wound care called to say patient does not need unna boots Patient ID: Bion Todorov, male   DOB: 1946/06/26, 75 y.o.   MRN: [id number]   Chip Boer 09/27/2020, 7:38 AM

## 2020-09-28 ENCOUNTER — Inpatient Hospital Stay (HOSPITAL_COMMUNITY): Payer: Medicare Other

## 2020-09-28 DIAGNOSIS — M7989 Other specified soft tissue disorders: Secondary | ICD-10-CM | POA: Diagnosis not present

## 2020-09-28 DIAGNOSIS — I10 Essential (primary) hypertension: Secondary | ICD-10-CM | POA: Diagnosis not present

## 2020-09-28 DIAGNOSIS — L03115 Cellulitis of right lower limb: Secondary | ICD-10-CM | POA: Diagnosis not present

## 2020-09-28 DIAGNOSIS — R609 Edema, unspecified: Secondary | ICD-10-CM | POA: Diagnosis not present

## 2020-09-28 DIAGNOSIS — A419 Sepsis, unspecified organism: Secondary | ICD-10-CM | POA: Diagnosis not present

## 2020-09-28 DIAGNOSIS — J189 Pneumonia, unspecified organism: Secondary | ICD-10-CM | POA: Diagnosis not present

## 2020-09-28 LAB — CBC WITH DIFFERENTIAL/PLATELET
Abs Immature Granulocytes: 0.05 10*3/uL (ref 0.00–0.07)
Basophils Absolute: 0.1 10*3/uL (ref 0.0–0.1)
Basophils Relative: 1 %
Eosinophils Absolute: 0.1 10*3/uL (ref 0.0–0.5)
Eosinophils Relative: 2 %
HCT: 37 % — ABNORMAL LOW (ref 39.0–52.0)
Hemoglobin: 11.8 g/dL — ABNORMAL LOW (ref 13.0–17.0)
Immature Granulocytes: 1 %
Lymphocytes Relative: 26 %
Lymphs Abs: 2.3 10*3/uL (ref 0.7–4.0)
MCH: 27.6 pg (ref 26.0–34.0)
MCHC: 31.9 g/dL (ref 30.0–36.0)
MCV: 86.7 fL (ref 80.0–100.0)
Monocytes Absolute: 1 10*3/uL (ref 0.1–1.0)
Monocytes Relative: 11 %
Neutro Abs: 5.3 10*3/uL (ref 1.7–7.7)
Neutrophils Relative %: 59 %
Platelets: 252 10*3/uL (ref 150–400)
RBC: 4.27 MIL/uL (ref 4.22–5.81)
RDW: 16.6 % — ABNORMAL HIGH (ref 11.5–15.5)
WBC: 8.8 10*3/uL (ref 4.0–10.5)
nRBC: 0 % (ref 0.0–0.2)

## 2020-09-28 LAB — MAGNESIUM: Magnesium: 1.7 mg/dL (ref 1.7–2.4)

## 2020-09-28 LAB — BASIC METABOLIC PANEL
Anion gap: 10 (ref 5–15)
BUN: 13 mg/dL (ref 8–23)
CO2: 32 mmol/L (ref 22–32)
Calcium: 9.4 mg/dL (ref 8.9–10.3)
Chloride: 93 mmol/L — ABNORMAL LOW (ref 98–111)
Creatinine, Ser: 0.97 mg/dL (ref 0.61–1.24)
GFR, Estimated: >60 mL/min (ref >60)
Glucose, Bld: 98 mg/dL (ref 70–99)
Potassium: 3.6 mmol/L (ref 3.5–5.1)
Sodium: 135 mmol/L (ref 135–145)

## 2020-09-28 NOTE — Plan of Care (Signed)
  Problem: Skin Integrity: Goal: Skin integrity will improve Outcome: Progressing   Problem: Education: Goal: Knowledge of General Education information will improve Description: Including pain rating scale, medication(s)/side effects and non-pharmacologic comfort measures Outcome: Progressing   Problem: Clinical Measurements: Goal: Ability to avoid or minimize complications of infection will improve Outcome: Progressing   Problem: Clinical Measurements: Goal: Ability to avoid or minimize complications of infection will improve Outcome: Progressing

## 2020-09-28 NOTE — Progress Notes (Signed)
Progress Note  Patient Name: Shane Diaz Date of Encounter: 09/28/2020  Primary Cardiologist: Sanda Klein, MD   Subjective   No specific complaints. He is worried about his legs.   Inpatient Medications    Scheduled Meds: . amiodarone  200 mg Oral Daily  . apixaban  5 mg Oral BID  . ferrous sulfate  325 mg Oral Q breakfast  . furosemide  40 mg Intravenous Q12H  . metoprolol tartrate  50 mg Oral BID   Continuous Infusions: . azithromycin 500 mg (09/27/20 1353)  . cefTRIAXone (ROCEPHIN)  IV 2 g (09/27/20 1319)  . vancomycin     PRN Meds: acetaminophen **OR** acetaminophen, albuterol, ondansetron **OR** ondansetron (ZOFRAN) IV, polyethylene glycol   Vital Signs    Vitals:   09/27/20 2002 09/27/20 2351 09/28/20 0351 09/28/20 0800  BP: (!) 158/93 129/74 135/73 (!) 142/77  Pulse: 93 78 81 82  Resp: 18 18 20 18   Temp: 98.7 F (37.1 C) 98 F (36.7 C) 98.3 F (36.8 C) 98.6 F (37 C)  TempSrc: Oral Oral Oral Oral  SpO2: 90% 90% 93% 97%  Weight:   125.5 kg   Height:        Intake/Output Summary (Last 24 hours) at 09/28/2020 0936 Last data filed at 09/28/2020 0800 Gross per 24 hour  Intake --  Output 1900 ml  Net -1900 ml   Filed Weights   09/26/20 0538 09/27/20 0433 09/28/20 0351  Weight: 128.5 kg 128.5 kg 125.5 kg    Physical Exam   General: Obese, NAD Neck: Negative for carotid bruits. No JVD Lungs:Clear to ausculation bilaterally. No wheezes, rales, or rhonchi. Breathing is unlabored. Cardiovascular: RRR with S1 S2. No murmurs Abdomen: Soft, non-tender, distended. No obvious abdominal masses. Extremities: 2-3+ BLE edema.  Psych: Responds to questions appropriately with normal affect.    Labs    Chemistry Recent Labs  Lab 09/24/20 1310 09/25/20 0359 09/26/20 0425 09/27/20 0304 09/28/20 0408  NA 136   < > 137 135 135  K 3.6   < > 4.0 4.2 3.6  CL 101   < > 99 97* 93*  CO2 24   < > 27 28 32  GLUCOSE 149*   < > 106* 105* 98  BUN 19   < >  16 16 13   CREATININE 1.08   < > 0.74 0.80 0.97  CALCIUM 9.5   < > 9.4 9.4 9.4  PROT 7.1  --   --   --   --   ALBUMIN 3.3*  --   --   --   --   AST 30  --   --   --   --   ALT 18  --   --   --   --   ALKPHOS 104  --   --   --   --   BILITOT 0.4  --   --   --   --   GFRNONAA >60   < > >60 >60 >60  ANIONGAP 11   < > 11 10 10    < > = values in this interval not displayed.     Hematology Recent Labs  Lab 09/26/20 0425 09/27/20 0304 09/28/20 0408  WBC 12.6* 9.8 8.8  RBC 3.69* 3.78* 4.27  HGB 10.3* 10.6* 11.8*  HCT 33.1* 33.5* 37.0*  MCV 89.7 88.6 86.7  MCH 27.9 28.0 27.6  MCHC 31.1 31.6 31.9  RDW 17.0* 16.8* 16.6*  PLT 207 209 252  Cardiac EnzymesNo results for input(s): TROPONINI in the last 168 hours. No results for input(s): TROPIPOC in the last 168 hours.   BNP Recent Labs  Lab 09/27/20 1620  BNP 318.0*     DDimer No results for input(s): DDIMER in the last 168 hours.   Radiology    No results found.  Telemetry    09/28/20 NSR- Personally Reviewed  ECG    No new tracing as of 09/28/20- Personally Reviewed  Cardiac Studies   TEE: 07/13/2020 1. TEE with no RAA/LAA thrombus DCC x 1 with 200J converted from atrial  flutter rate 133 bpm to NSR rate 88 bpm On eliquis with no immediate  neurologic sequelae.  2. EF decreased with patient in rapid atrial flutter rate 133. Left  ventricular ejection fraction, by estimation, is 45 to 50%. The left  ventricle has mildly decreased function. There is mild left ventricular  hypertrophy.  3. Right ventricular systolic function is severely reduced. The right  ventricular size is severely enlarged.  4. Left atrial size was moderately dilated. No left atrial/left atrial  appendage thrombus was detected.  5. Right atrial size was severely dilated.  6. The mitral valve is normal in structure. Mild mitral valve  regurgitation.  7. Tricuspid valve regurgitation is moderate.  8. The aortic valve is tricuspid.  Aortic valve regurgitation is mild.  Mild aortic valve sclerosis is present, with no evidence of aortic valve  stenosis.   ECHO: 07/11/2020 1. Left ventricular ejection fraction, by estimation, is 50 to 55%. The  left ventricle has low normal function. The left ventricle has no regional  wall motion abnormalities. There is moderate left ventricular hypertrophy.  Left ventricular diastolic  parameters are indeterminate.  2. Right ventricular systolic function is mildly reduced. The right  ventricular size is mildly enlarged. There is mildly elevated pulmonary  artery systolic pressure. The estimated right ventricular systolic  pressure is 03.5 mmHg.  3. Right atrial size was mildly dilated.  4. The mitral valve is normal in structure. Trivial mitral valve  regurgitation.  5. The aortic valve was not well visualized. Aortic valve regurgitation  is not visualized. No aortic stenosis is present.  6. Aortic dilatation noted. There is mild dilatation of the ascending  aorta, measuring 38 mm.  7. The inferior vena cava is dilated in size with <50% respiratory  variability, suggesting right atrial pressure of 15 mmHg.   Patient Profile     75 y.o. male with a hx of chronic diastolic CHF,atrial fibrillation/flutter s/p multiple recent DCCVs on Amiodarone and Eliquis,recurrent DVTs, chronic superficialvenous insufficiencywith ultrasound demonstrated reflux, varicose veins s/p ablation, HTN, HLD, OSA, morbid obesity, Covid PNA 05/2020, and fatty liver  who is being seen today for the evaluation of CHF at the request of Dr Starla Link.  Assessment & Plan    1. Acute on chronic diastolic CHF: -Pt presented with acute sepsis treated with IVF hydration which resulted in fluid volume overload>>currenlty being diuresed with IV Lasix  -Weight, 276lb today>>admission weight at 283lb -I&O, net negative 2.3L -Cr, 0.97 today  -BNP at 318 on 09/27/20 -Chest x-ray on admission showing slightly  increased left lower lobe opacity, atelectasis/infection and small bilateral pleural effusions. -Continue IV lasix 40mg  BID   2.  Persistent atrial fibrillation: -Maintaining NSR with stable rates  -Continue amiodarone and Eliquis -TSH, 3.690  3. Sepsis due to PNA/cellulitis: -Treated with IVF -Complete antibiotics and further management per IM  4.  OSA>>not on CPAP: -Ordered in OP setting in  last follow up however not yet complete -Emphasized need to complete this   Signed, Kathyrn Drown NP-[facility] Pager: [phone] 09/28/2020, 9:36 AM     For questions or updates, please contact   Please consult [email or web address] for contact info under Cardiology/STEMI.

## 2020-09-28 NOTE — TOC Initial Note (Signed)
Transition of Care [facility]) - Initial/Assessment Note    Patient Details  Name: Shane Diaz MRN: [id number] Date of Birth: [birth year]  Transition of Care [facility]) CM/SW Contact:    Pollie Friar, RN Phone Number: 09/28/2020, 10:27 AM  Clinical Narrative:                 Pt lives at home with his spouse that he says is with him most of the time.  He states he is able to drive.  Pt denies issues with home medications.  Pt asking about home oxygen. CM informed him that would be assessed closer to d/c home and can be arranged if needed.  TOC following.  Expected Discharge Plan: Home/Self Care Barriers to Discharge: Continued Medical Work up   Patient Goals and CMS Choice        Expected Discharge Plan and Services Expected Discharge Plan: Home/Self Care   Discharge Planning Services: CM Consult   Living arrangements for the past 2 months: Single Family Home                                      Prior Living Arrangements/Services Living arrangements for the past 2 months: Single Family Home Lives with:: Spouse Patient language and need for interpreter reviewed:: Yes Do you feel safe going back to the place where you live?: Yes        Care giver support system in place?: Yes (comment) Current home services: DME (Pt states he has all the DME he needs) Criminal Activity/Legal Involvement Pertinent to Current Situation/Hospitalization: No - Comment as needed  Activities of Daily Living      Permission Sought/Granted                  Emotional Assessment Appearance:: Appears stated age Attitude/Demeanor/Rapport: Engaged Affect (typically observed): Accepting Orientation: : Oriented to Self,Oriented to Place,Oriented to  Time,Oriented to Situation   Psych Involvement: No (comment)  Admission diagnosis:  Sepsis due to pneumonia ([place]) [J18.9, A41.9] Patient Active Problem List   Diagnosis Date Noted  . Cellulitis of leg, right 09/24/2020  . Pneumonia due  to infectious organism 09/24/2020  . Obesity, Class III, BMI 40-49.9 (morbid obesity) ([place]) 09/24/2020  . Lactic acidosis 09/24/2020  . Sepsis due to pneumonia ([place]) 09/24/2020  . Acute diastolic heart failure ([place]) 08/03/2020  . OSA (obstructive sleep apnea) 08/03/2020  . DVT, bilateral lower limbs ([place]) hx of 08/03/2020  . Coronary artery calcification seen on CT scan 08/03/2020  . Atrial fibrillation with RVR ([place]) 08/01/2020  . Iron deficiency anemia   . Occult blood in stools   . A-fib ([place]) 07/07/2020  . Pneumonia due to COVID-19 virus 05/08/2020  . Sepsis ([place]) 05/08/2020  . Atrial fibrillation with rapid ventricular response ([place]) 05/08/2020  . Elevated troponin 05/08/2020  . Essential hypertension 05/08/2020  . Varicose veins of leg with complications 23/55/7322  . Varicose veins of left lower extremity with complications 02/54/2706  . Varicose veins of bilateral lower extremities with other complications 23/76/2831  . Special screening for malignant neoplasms, colon 04/11/2014   PCP:  [name], MD Pharmacy:   [facility] [address] [facility] [address] [place] [address] Phone: [phone] Fax: [phone]     Social Determinants of Health (SDOH) Interventions    Readmission Risk Interventions No flowsheet data found.

## 2020-09-28 NOTE — Progress Notes (Signed)
PT Cancellation Note  Patient Details Name: Shane Diaz MRN: [id number] DOB: [birth year]   Cancelled Treatment:    Reason Eval/Treat Not Completed: Patient declined, no reason specified. Pt continues to deny to get up OOB due to not feeling well. Pt reports he will get up in a couple days, even after extensive education of risks of muscle atrophy and increased balance deficits with prolonged bed rest. Pt states he remembers exercises from previous session and will perform them this weekend. Will follow-up another day.   Moishe Spice, PT, DPT Acute Rehabilitation Services  Pager: [phone] Office: [place] 09/28/2020, 2:12 PM

## 2020-09-28 NOTE — Plan of Care (Signed)

## 2020-09-28 NOTE — Care Management Important Message (Signed)
Important Message  Patient Details  Name: Shane Diaz MRN: [id number] Date of Birth: 1945-10-26   Medicare Important Message Given:  Yes     Barb Merino [place] 09/28/2020, 2:40 PM

## 2020-09-28 NOTE — Progress Notes (Addendum)
Patient ID: Shane Diaz, male   DOB: 1946-07-05, 75 y.o.   MRN: [id number]  PROGRESS NOTE    Shane Diaz  UDJ:[id number] DOB: Jul 26, 1946 DOA: 09/24/2020 PCP: [name], MD   Brief Narrative:  75 year old male with history of hypertension, hyperlipidemia, obstructive sleep apnea, chronic hypoxic respiratory failure on 3 L oxygen via nasal cannula at home, paroxysmal A. fib, chronic diastolic CHF, obesity presented with generalized weakness or shortness of breath and fever.  Presentation, he was febrile to 103.1 and initially hypotensive with a blood pressure of 87/49 which improved to 101/58 after fluid resuscitation.  WBCs of 17.4, lactic acid of 2.3 which improved to 2.1 after fluid resuscitation.  COVID-19 and influenza testing were negative.  Chest x-ray showed left lower lobe opacities concerning for pneumonia.  He was started on Rocephin and Zithromax.  Assessment & Plan:   Severe sepsis: Present on admission, possibly from below Community-acquired bacterial pneumonia Bilateral lower extremity cellulitis Leukocytosis -Patient presented with generalized weakness and was febrile, hypotensive with leukocytosis and lactic acid of 2.3 which improved with IV fluids and chest x-ray showing possible left lower lobe pneumonia.  He also has bilateral lower extremity cellulitis.  COVID-19 and flu testing were negative -Cultures negative so far.  Sepsis has resolved -Continue Rocephin, Zithromax and vancomycin. -WBCs normalized.  Repeat a.m. labs.  Acute on chronic hypoxic respiratory failure -Probably from pneumonia.  Was requiring up to 6 L nasal cannula oxygen; currently on room air  Paroxysmal A. Fib -Currently rate controlled.  Continue amiodarone, Eliquis and metoprolol  Essential hypertension -Blood pressure improved.  Continue metoprolol.  Currently on IV Lasix.  Acute on chronic diastolic heart failure -Strict input and output.  Daily weights.  Fluid restriction.  Negative  balance of 2390.6 cc since admission.  Cardiology following.  Continue IV Lasix  Morbid obesity-outpatient follow-up   DVT prophylaxis: Eliquis Code Status: Full Family Communication: called daughter/Dianna on phone on 09/28/20 but she didn't pick up Disposition Plan: Status is: Inpatient  Remains inpatient appropriate because:Inpatient level of care appropriate due to severity of illness   Dispo: The patient is from: Home              Anticipated d/c is to: Home              Anticipated d/c date is: 1 day              Patient currently is not medically stable to d/c.   Difficult to place patient No  Consultants: Cardiology  Procedures: None  Antimicrobials: Rocephin and Zithromax from 09/24/20 onwards.  Vancomycin from 09/27/2020 onwards   Subjective: Patient seen and examined at bedside.  Does not feel well; still complains of lower extremity swelling and some pain.  No overnight fever or vomiting reported.  Denies worsening chest pain.  Slightly short of breath with some exertion.   Objective: Vitals:   09/27/20 1931 09/27/20 2002 09/27/20 2351 09/28/20 0351  BP: (!) 161/90 (!) 158/93 129/74 135/73  Pulse: 93 93 78 81  Resp: 19 18 18 20   Temp: 100.3 F (37.9 C) 98.7 F (37.1 C) 98 F (36.7 C) 98.3 F (36.8 C)  TempSrc: Oral Oral Oral Oral  SpO2: 91% 90% 90% 93%  Weight:    125.5 kg  Height:        Intake/Output Summary (Last 24 hours) at 09/28/2020 0743 Last data filed at 09/28/2020 0402 Gross per 24 hour  Intake --  Output 1850 ml  Net -1850 ml  Filed Weights   09/26/20 0538 09/27/20 0433 09/28/20 0351  Weight: 128.5 kg 128.5 kg 125.5 kg    Examination:  General exam: No acute distress.  Looks chronically ill.  Currently on room air.   Respiratory system: Decreased breath sounds at bases with some basilar crackles cardiovascular system: Rate controlled, S1-S2 heard gastrointestinal system: Abdomen is morbidly obese, distended, soft and nontender.  Bowel  sounds are heard extremities: Lower extremity 2+ edema present bilaterally; no clubbing Central nervous system: Alert and oriented.  No focal neurological deficits.  Moves extremities  skin: Bilateral chronic skin changes of lower extremities with venous stasis dermatitis with erythema and tenderness of bilateral lower extremities, more tender on the right lower extremity but no discharge Psychiatry: Intermittently looks anxious   Data Reviewed: I have personally reviewed following labs and imaging studies  CBC: Recent Labs  Lab 09/24/20 1310 09/25/20 0359 09/26/20 0425 09/27/20 0304 09/28/20 0408  WBC 17.4* 18.4* 12.6* 9.8 8.8  NEUTROABS 14.8*  --  8.5* 6.1 5.3  HGB 12.7* 11.2* 10.3* 10.6* 11.8*  HCT 41.6 38.0* 33.1* 33.5* 37.0*  MCV 88.5 91.3 89.7 88.6 86.7  PLT 294 234 207 209 287   Basic Metabolic Panel: Recent Labs  Lab 09/24/20 1310 09/25/20 0359 09/26/20 0425 09/27/20 0304 09/28/20 0408  NA 136 136 137 135 135  K 3.6 4.5 4.0 4.2 3.6  CL 101 100 99 97* 93*  CO2 24 28 27 28  32  GLUCOSE 149* 112* 106* 105* 98  BUN 19 20 16 16 13   CREATININE 1.08 0.99 0.74 0.80 0.97  CALCIUM 9.5 9.0 9.4 9.4 9.4  MG  --   --  1.7 1.6* 1.7   GFR: Estimated Creatinine Clearance: 86.2 mL/min (by C-G formula based on SCr of 0.97 mg/dL). Liver Function Tests: Recent Labs  Lab 09/24/20 1310  AST 30  ALT 18  ALKPHOS 104  BILITOT 0.4  PROT 7.1  ALBUMIN 3.3*   No results for input(s): LIPASE, AMYLASE in the last 168 hours. No results for input(s): AMMONIA in the last 168 hours. Coagulation Profile: Recent Labs  Lab 09/24/20 1310  INR 1.2   Cardiac Enzymes: No results for input(s): CKTOTAL, CKMB, CKMBINDEX, TROPONINI in the last 168 hours. BNP (last 3 results) No results for input(s): PROBNP in the last 8760 hours. HbA1C: No results for input(s): HGBA1C in the last 72 hours. CBG: No results for input(s): GLUCAP in the last 168 hours. Lipid Profile: No results for  input(s): CHOL, HDL, LDLCALC, TRIG, CHOLHDL, LDLDIRECT in the last 72 hours. Thyroid Function Tests: No results for input(s): TSH, T4TOTAL, FREET4, T3FREE, THYROIDAB in the last 72 hours. Anemia Panel: No results for input(s): VITAMINB12, FOLATE, FERRITIN, TIBC, IRON, RETICCTPCT in the last 72 hours. Sepsis Labs: Recent Labs  Lab 09/24/20 1340 09/24/20 1404 09/24/20 1622  LATICACIDVEN 2.9* 2.3* 2.1*    Recent Results (from the past 240 hour(s))  Culture, blood (Routine x 2)     Status: None (Preliminary result)   Collection Time: 09/24/20  1:20 PM   Specimen: BLOOD RIGHT HAND  Result Value Ref Range Status   Specimen Description BLOOD RIGHT HAND  Final   Special Requests   Final    BOTTLES DRAWN AEROBIC AND ANAEROBIC Blood Culture results may not be optimal due to an excessive volume of blood received in culture bottles   Culture   Final    NO GROWTH 3 DAYS Performed at [facility], [place] [address], [place], [place]  [address]    Report Status PENDING  Incomplete  Culture, blood (Routine x 2)     Status: None (Preliminary result)   Collection Time: 09/24/20  2:04 PM   Specimen: BLOOD RIGHT FOREARM  Result Value Ref Range Status   Specimen Description BLOOD RIGHT FOREARM  Final   Special Requests   Final    BOTTLES DRAWN AEROBIC AND ANAEROBIC Blood Culture adequate volume   Culture   Final    NO GROWTH 3 DAYS Performed at [facility], [address], [place], [place] [address]    Report Status PENDING  Incomplete  Resp Panel by RT-PCR (Flu A&B, Covid) Nasopharyngeal Swab     Status: None   Collection Time: 09/24/20  3:23 PM   Specimen: Nasopharyngeal Swab; Nasopharyngeal(NP) swabs in vial transport medium  Result Value Ref Range Status   SARS Coronavirus 2 by RT PCR NEGATIVE NEGATIVE Final    Comment: (NOTE) SARS-CoV-2 target nucleic acids are NOT DETECTED.  The SARS-CoV-2 RNA is generally detectable in upper respiratory specimens during the acute phase of  infection. The lowest concentration of SARS-CoV-2 viral copies this assay can detect is 138 copies/mL. A negative result does not preclude SARS-Cov-2 infection and should not be used as the sole basis for treatment or other patient management decisions. A negative result may occur with  improper specimen collection/handling, submission of specimen other than nasopharyngeal swab, presence of viral mutation(s) within the areas targeted by this assay, and inadequate number of viral copies(<138 copies/mL). A negative result must be combined with clinical observations, patient history, and epidemiological information. The expected result is Negative.  Fact Sheet for Patients:  EntrepreneurPulse.com.au  Fact Sheet for Healthcare Providers:  IncredibleEmployment.be  This test is no t yet approved or cleared by the [place] FDA and  has been authorized for detection and/or diagnosis of SARS-CoV-2 by FDA under an Emergency Use Authorization (EUA). This EUA will remain  in effect (meaning this test can be used) for the duration of the COVID-19 declaration under Section 564(b)(1) of the Act, 21 U.S.C.section 360bbb-3(b)(1), unless the authorization is terminated  or revoked sooner.       Influenza A by PCR NEGATIVE NEGATIVE Final   Influenza B by PCR NEGATIVE NEGATIVE Final    Comment: (NOTE) The Xpert Xpress SARS-CoV-2/FLU/RSV plus assay is intended as an aid in the diagnosis of influenza from Nasopharyngeal swab specimens and should not be used as a sole basis for treatment. Nasal washings and aspirates are unacceptable for Xpert Xpress SARS-CoV-2/FLU/RSV testing.  Fact Sheet for Patients: EntrepreneurPulse.com.au  Fact Sheet for Healthcare Providers: IncredibleEmployment.be  This test is not yet approved or cleared by the [place] FDA and has been authorized for detection and/or diagnosis of SARS-CoV-2  by FDA under an Emergency Use Authorization (EUA). This EUA will remain in effect (meaning this test can be used) for the duration of the COVID-19 declaration under Section 564(b)(1) of the Act, 21 U.S.C. section 360bbb-3(b)(1), unless the authorization is terminated or revoked.  Performed at [facility], [place] [address], [place], [place] [address]          Radiology Studies: No results found.      Scheduled Meds: . amiodarone  200 mg Oral Daily  . apixaban  5 mg Oral BID  . ferrous sulfate  325 mg Oral Q breakfast  . furosemide  40 mg Intravenous Q12H  . metoprolol tartrate  50 mg Oral BID   Continuous Infusions: . azithromycin  500 mg (09/27/20 1353)  . cefTRIAXone (ROCEPHIN)  IV 2 g (09/27/20 1319)  . vancomycin            Aline August, MD [facility] 09/28/2020, 7:43 AM

## 2020-09-28 NOTE — Progress Notes (Signed)
Lower extremity venous has been completed.   Preliminary results in CV Proc.   Abram Sander 09/28/2020 11:43 AM

## 2020-09-29 DIAGNOSIS — I480 Paroxysmal atrial fibrillation: Secondary | ICD-10-CM | POA: Diagnosis not present

## 2020-09-29 DIAGNOSIS — I5033 Acute on chronic diastolic (congestive) heart failure: Secondary | ICD-10-CM

## 2020-09-29 DIAGNOSIS — A419 Sepsis, unspecified organism: Secondary | ICD-10-CM | POA: Diagnosis not present

## 2020-09-29 DIAGNOSIS — L03115 Cellulitis of right lower limb: Secondary | ICD-10-CM | POA: Diagnosis not present

## 2020-09-29 DIAGNOSIS — I10 Essential (primary) hypertension: Secondary | ICD-10-CM | POA: Diagnosis not present

## 2020-09-29 LAB — CBC WITH DIFFERENTIAL/PLATELET
Abs Immature Granulocytes: 0.08 10*3/uL — ABNORMAL HIGH (ref 0.00–0.07)
Basophils Absolute: 0.1 10*3/uL (ref 0.0–0.1)
Basophils Relative: 1 %
Eosinophils Absolute: 0.3 10*3/uL (ref 0.0–0.5)
Eosinophils Relative: 4 %
HCT: 37.7 % — ABNORMAL LOW (ref 39.0–52.0)
Hemoglobin: 11.9 g/dL — ABNORMAL LOW (ref 13.0–17.0)
Immature Granulocytes: 1 %
Lymphocytes Relative: 22 %
Lymphs Abs: 1.9 10*3/uL (ref 0.7–4.0)
MCH: 27.2 pg (ref 26.0–34.0)
MCHC: 31.6 g/dL (ref 30.0–36.0)
MCV: 86.3 fL (ref 80.0–100.0)
Monocytes Absolute: 1.1 10*3/uL — ABNORMAL HIGH (ref 0.1–1.0)
Monocytes Relative: 13 %
Neutro Abs: 5.2 10*3/uL (ref 1.7–7.7)
Neutrophils Relative %: 59 %
Platelets: 235 10*3/uL (ref 150–400)
RBC: 4.37 MIL/uL (ref 4.22–5.81)
RDW: 16.2 % — ABNORMAL HIGH (ref 11.5–15.5)
WBC: 8.7 10*3/uL (ref 4.0–10.5)
nRBC: 0 % (ref 0.0–0.2)

## 2020-09-29 LAB — CULTURE, BLOOD (ROUTINE X 2)
Culture: NO GROWTH
Culture: NO GROWTH
Special Requests: ADEQUATE

## 2020-09-29 LAB — BASIC METABOLIC PANEL
Anion gap: 10 (ref 5–15)
BUN: 12 mg/dL (ref 8–23)
CO2: 32 mmol/L (ref 22–32)
Calcium: 9.4 mg/dL (ref 8.9–10.3)
Chloride: 92 mmol/L — ABNORMAL LOW (ref 98–111)
Creatinine, Ser: 0.89 mg/dL (ref 0.61–1.24)
GFR, Estimated: >60 mL/min (ref >60)
Glucose, Bld: 97 mg/dL (ref 70–99)
Potassium: 3.6 mmol/L (ref 3.5–5.1)
Sodium: 134 mmol/L — ABNORMAL LOW (ref 135–145)

## 2020-09-29 LAB — MAGNESIUM: Magnesium: 1.7 mg/dL (ref 1.7–2.4)

## 2020-09-29 MED ORDER — FUROSEMIDE 40 MG PO TABS
40.0000 mg | ORAL_TABLET | Freq: Every day | ORAL | Status: DC
  Administered 2020-09-29 – 2020-10-02 (×4): 40 mg via ORAL
  Filled 2020-09-29 (×4): qty 1

## 2020-09-29 NOTE — Plan of Care (Signed)

## 2020-09-29 NOTE — Progress Notes (Signed)
Progress Note  Patient Name: Shane Diaz Date of Encounter: 09/29/2020  [facility] Cardiologist: Sanda Klein, MD   Subjective   Denies dyspnea or CP  Inpatient Medications    Scheduled Meds: . amiodarone  200 mg Oral Daily  . apixaban  5 mg Oral BID  . ferrous sulfate  325 mg Oral Q breakfast  . furosemide  40 mg Intravenous Q12H  . metoprolol tartrate  50 mg Oral BID   Continuous Infusions: . azithromycin 500 mg (09/28/20 1546)  . cefTRIAXone (ROCEPHIN)  IV 2 g (09/28/20 1507)  . vancomycin 2,000 mg (09/28/20 1228)   PRN Meds: acetaminophen **OR** acetaminophen, albuterol, ondansetron **OR** ondansetron (ZOFRAN) IV, polyethylene glycol   Vital Signs    Vitals:   09/29/20 0010 09/29/20 0431 09/29/20 0700 09/29/20 0823  BP: 117/62 112/61  105/68  Pulse: 72 71  83  Resp: 18 18  18   Temp: 97.8 F (36.6 C) 98.6 F (37 C)  97.7 F (36.5 C)  TempSrc: Oral Oral  Oral  SpO2: 99% 99%  99%  Weight:   (P) 118.4 kg   Height:        Intake/Output Summary (Last 24 hours) at 09/29/2020 0938 Last data filed at 09/29/2020 0432 Gross per 24 hour  Intake 250 ml  Output 1950 ml  Net -1700 ml   Last 3 Weights 09/29/2020 09/28/2020 09/27/2020  Weight (lbs) 261 lb 1.6 oz 276 lb 10.8 oz 283 lb 4.7 oz  Weight (kg) 118.434 kg 125.5 kg 128.5 kg      Telemetry    Sinus - Personally Reviewed   Physical Exam   GEN: No acute distress.   Neck: No JVD Cardiac: RRR, no murmurs, rubs, or gallops.  Respiratory: Clear to auscultation bilaterally. GI: Soft, nontender, non-distended  MS: trace edema; erythema/cellulitis lower ext bilaterally Neuro:  Nonfocal  Psych: Normal affect   Labs     Chemistry Recent Labs  Lab 09/24/20 1310 09/25/20 0359 09/27/20 0304 09/28/20 0408 09/29/20 0321  NA 136   < > 135 135 134*  K 3.6   < > 4.2 3.6 3.6  CL 101   < > 97* 93* 92*  CO2 24   < > 28 32 32  GLUCOSE 149*   < > 105* 98 97  BUN 19   < > 16 13 12   CREATININE 1.08   <  > 0.80 0.97 0.89  CALCIUM 9.5   < > 9.4 9.4 9.4  PROT 7.1  --   --   --   --   ALBUMIN 3.3*  --   --   --   --   AST 30  --   --   --   --   ALT 18  --   --   --   --   ALKPHOS 104  --   --   --   --   BILITOT 0.4  --   --   --   --   GFRNONAA >60   < > >60 >60 >60  ANIONGAP 11   < > 10 10 10    < > = values in this interval not displayed.     Hematology Recent Labs  Lab 09/27/20 0304 09/28/20 0408 09/29/20 0321  WBC 9.8 8.8 8.7  RBC 3.78* 4.27 4.37  HGB 10.6* 11.8* 11.9*  HCT 33.5* 37.0* 37.7*  MCV 88.6 86.7 86.3  MCH 28.0 27.6 27.2  MCHC 31.6 31.9 31.6  RDW 16.8* 16.6*  16.2*  PLT 209 252 235    BNP Recent Labs  Lab 09/27/20 1620  BNP 318.0*     Radiology    VAS [place] LOWER EXTREMITY VENOUS (DVT)  Result Date: 09/28/2020  Lower Venous DVT Study Indications: Swelling, and Edema.  Comparison Study: previous 06/15/14 Performing Technologist: Abram Sander RVS  Examination Guidelines: A complete evaluation includes B-mode imaging, spectral Doppler, color Doppler, and power Doppler as needed of all accessible portions of each vessel. Bilateral testing is considered an integral part of a complete examination. Limited examinations for reoccurring indications may be performed as noted. The reflux portion of the exam is performed with the patient in reverse Trendelenburg.  +---------+---------------+---------+-----------+----------+-------------------+ RIGHT    CompressibilityPhasicitySpontaneityPropertiesThrombus Aging      +---------+---------------+---------+-----------+----------+-------------------+ CFV      Full           Yes      Yes                                      +---------+---------------+---------+-----------+----------+-------------------+ SFJ      Full                                                             +---------+---------------+---------+-----------+----------+-------------------+ FV Prox  Full                                                              +---------+---------------+---------+-----------+----------+-------------------+ FV Mid   Full                                                             +---------+---------------+---------+-----------+----------+-------------------+ FV DistalFull                                                             +---------+---------------+---------+-----------+----------+-------------------+ PFV      Full                                                             +---------+---------------+---------+-----------+----------+-------------------+ POP      Full           Yes      Yes                                      +---------+---------------+---------+-----------+----------+-------------------+ PTV      Full                                                             +---------+---------------+---------+-----------+----------+-------------------+  PERO                                                  Not well visualized +---------+---------------+---------+-----------+----------+-------------------+   +---------+---------------+---------+-----------+----------+-------------------+ LEFT     CompressibilityPhasicitySpontaneityPropertiesThrombus Aging      +---------+---------------+---------+-----------+----------+-------------------+ CFV      Full           Yes      Yes                                      +---------+---------------+---------+-----------+----------+-------------------+ SFJ      Full                                                             +---------+---------------+---------+-----------+----------+-------------------+ FV Prox  Full                                                             +---------+---------------+---------+-----------+----------+-------------------+ FV Mid   Full                                                              +---------+---------------+---------+-----------+----------+-------------------+ FV DistalFull                                                             +---------+---------------+---------+-----------+----------+-------------------+ PFV      Full                                                             +---------+---------------+---------+-----------+----------+-------------------+ POP      Full           Yes      Yes                                      +---------+---------------+---------+-----------+----------+-------------------+ PTV                                                   Not well visualized +---------+---------------+---------+-----------+----------+-------------------+ PERO  Not well visualized +---------+---------------+---------+-----------+----------+-------------------+     Summary: BILATERAL: - No evidence of deep vein thrombosis seen in the lower extremities, bilaterally. - No evidence of superficial venous thrombosis in the lower extremities, bilaterally. -No evidence of popliteal cyst, bilaterally.   *See table(s) above for measurements and observations.    Preliminary     Patient Profile     75 y.o. male with past medical history of chronic diastolic congestive heart failure, paroxysmal atrial fibrillation with previous cardioversions now on amiodarone, recurrent DVTs, hypertension, hyperlipidemia, obstructive sleep apnea, morbid obesity with acute on chronic diastolic congestive heart failure. Initially admitted with sepsis treated with IV hydration. Last echocardiogram December 2021 showed ejection fraction 50 to 55%, moderate left ventricular hypertrophy, mild right ventricular enlargement with mild RV dysfunction, mild right atrial enlargement.  Assessment & Plan    1 acute on chronic diastolic congestive heart failure-I/O-1750.  Volume status has improved.  Change Lasix to 40 mg by mouth  daily which is his home dose.  Continue long-term.  2 paroxysmal atrial fibrillation-patient remains in sinus rhythm.  Continue amiodarone and apixaban.  3 cellulitis-antibiotics per primary care.  4 hypertension-blood pressure controlled.  Continue present medications.  Cardiology will sign off.  Please continue present medications at discharge.  He can follow-up with Dr. Sallyanne Kuster 6 to 8 weeks following discharge.  Please call with questions.  For questions or updates, please contact [place] Please consult [email or web address] for contact info under        Signed, Kirk Ruths, MD  09/29/2020, 9:38 AM

## 2020-09-29 NOTE — Progress Notes (Signed)
Patient ID: Shane Diaz, male   DOB: [birth year], 75 y.o.   MRN: [id number]  PROGRESS NOTE    Aayden Cefalu  WGY:[id number] DOB: 05-19-1946 DOA: 09/24/2020 PCP: [name], MD   Brief Narrative:  75 year old male with history of hypertension, hyperlipidemia, obstructive sleep apnea, chronic hypoxic respiratory failure on 3 L oxygen via nasal cannula at home, paroxysmal A. fib, chronic diastolic CHF, obesity presented with generalized weakness or shortness of breath and fever.  Presentation, he was febrile to 103.1 and initially hypotensive with a blood pressure of 87/49 which improved to 101/58 after fluid resuscitation.  WBCs of 17.4, lactic acid of 2.3 which improved to 2.1 after fluid resuscitation.  COVID-19 and influenza testing were negative.  Chest x-ray showed left lower lobe opacities concerning for pneumonia.  He was started on Rocephin and Zithromax.  Assessment & Plan:   Severe sepsis: Present on admission, possibly from below Community-acquired bacterial pneumonia Bilateral lower extremity cellulitis Leukocytosis -Patient presented with generalized weakness and was febrile, hypotensive with leukocytosis and lactic acid of 2.3 which improved with IV fluids and chest x-ray showing possible left lower lobe pneumonia.  He also has bilateral lower extremity cellulitis.  COVID-19 and flu testing were negative -Cultures negative so far.  Sepsis has resolved -Currently on Rocephin, Zithromax and vancomycin.  DC Zithromax. -WBCs normalized.  Currently afebrile.  Repeat a.m. labs.  Acute on chronic hypoxic respiratory failure -Probably from pneumonia.  Was requiring up to 6 L nasal cannula oxygen; currently on 3 L oxygen via nasal cannula  Paroxysmal A. Fib -Currently rate controlled.  Continue amiodarone, Eliquis and metoprolol  Essential hypertension -Blood pressure improved.  Continue metoprolol.  Currently on IV Lasix.  Acute on chronic diastolic heart failure -Strict input and  output.  Daily weights.  Fluid restriction.  Negative balance of 4069.6 cc since admission.  Cardiology following.  Continue IV Lasix  Morbid obesity-outpatient follow-up   DVT prophylaxis: Eliquis Code Status: Full Family Communication: called daughter/Dianna on phone on 09/28/20 but she didn't pick up Disposition Plan: Status is: Inpatient  Remains inpatient appropriate because:Inpatient level of care appropriate due to severity of illness   Dispo: The patient is from: Home              Anticipated d/c is to: Home              Anticipated d/c date is: 2 days              Patient currently is not medically stable to d/c.   Difficult to place patient No  Consultants: Cardiology  Procedures: None  Antimicrobials: Rocephin and Zithromax from 09/24/20 onwards.  Vancomycin from 09/27/2020 onwards   Subjective: Patient seen and examined at bedside.  Still complains of bilateral lower extremity swelling with feels that the swelling and pain are improving.  breathing is improved.  No overnight fever, vomiting reported.  Objective: Vitals:   09/28/20 2023 09/29/20 0010 09/29/20 0431 09/29/20 0700  BP: (!) 111/59 117/62 112/61   Pulse: 87 72 71   Resp: 18 18 18    Temp: 98.1 F (36.7 C) 97.8 F (36.6 C) 98.6 F (37 C)   TempSrc: Oral Oral Oral   SpO2: 99% 99% 99%   Weight:    (P) 118.4 kg  Height:        Intake/Output Summary (Last 24 hours) at 09/29/2020 0755 Last data filed at 09/29/2020 0432 Gross per 24 hour  Intake 250 ml  Output 2000 ml  Net -1750  ml   Filed Weights   09/27/20 0433 09/28/20 0351 09/29/20 0700  Weight: 128.5 kg 125.5 kg (P) 118.4 kg    Examination:  General exam: No distress.  On 3 L oxygen via nasal cannula.  Looks chronically ill.   Respiratory system: Bilateral decreased breath sounds at bases with scattered crackles cardiovascular system: S1-S2 heard, rate controlled gastrointestinal system: Abdomen is morbidly obese, distended, soft and  nontender.  Bowel sounds heard  extremities: No cyanosis; bilateral lower extremity edema 2+ present Central nervous system: Awake and alert.  No focal neurological deficits.  Moving extremities  skin: Bilateral chronic skin changes of lower extremities with venous stasis dermatitis with erythema and tenderness of bilateral lower extremities still present, more tender on the right lower extremity but no discharge Psychiatry: Flat affect   Data Reviewed: I have personally reviewed following labs and imaging studies  CBC: Recent Labs  Lab 09/24/20 1310 09/25/20 0359 09/26/20 0425 09/27/20 0304 09/28/20 0408 09/29/20 0321  WBC 17.4* 18.4* 12.6* 9.8 8.8 8.7  NEUTROABS 14.8*  --  8.5* 6.1 5.3 5.2  HGB 12.7* 11.2* 10.3* 10.6* 11.8* 11.9*  HCT 41.6 38.0* 33.1* 33.5* 37.0* 37.7*  MCV 88.5 91.3 89.7 88.6 86.7 86.3  PLT 294 234 207 209 252 101   Basic Metabolic Panel: Recent Labs  Lab 09/25/20 0359 09/26/20 0425 09/27/20 0304 09/28/20 0408 09/29/20 0321  NA 136 137 135 135 134*  K 4.5 4.0 4.2 3.6 3.6  CL 100 99 97* 93* 92*  CO2 28 27 28  32 32  GLUCOSE 112* 106* 105* 98 97  BUN 20 16 16 13 12   CREATININE 0.99 0.74 0.80 0.97 0.89  CALCIUM 9.0 9.4 9.4 9.4 9.4  MG  --  1.7 1.6* 1.7 1.7   GFR: Estimated Creatinine Clearance: 93.9 mL/min (by C-G formula based on SCr of 0.89 mg/dL). Liver Function Tests: Recent Labs  Lab 09/24/20 1310  AST 30  ALT 18  ALKPHOS 104  BILITOT 0.4  PROT 7.1  ALBUMIN 3.3*   No results for input(s): LIPASE, AMYLASE in the last 168 hours. No results for input(s): AMMONIA in the last 168 hours. Coagulation Profile: Recent Labs  Lab 09/24/20 1310  INR 1.2   Cardiac Enzymes: No results for input(s): CKTOTAL, CKMB, CKMBINDEX, TROPONINI in the last 168 hours. BNP (last 3 results) No results for input(s): PROBNP in the last 8760 hours. HbA1C: No results for input(s): HGBA1C in the last 72 hours. CBG: No results for input(s): GLUCAP in the last  168 hours. Lipid Profile: No results for input(s): CHOL, HDL, LDLCALC, TRIG, CHOLHDL, LDLDIRECT in the last 72 hours. Thyroid Function Tests: No results for input(s): TSH, T4TOTAL, FREET4, T3FREE, THYROIDAB in the last 72 hours. Anemia Panel: No results for input(s): VITAMINB12, FOLATE, FERRITIN, TIBC, IRON, RETICCTPCT in the last 72 hours. Sepsis Labs: Recent Labs  Lab 09/24/20 1340 09/24/20 1404 09/24/20 1622  LATICACIDVEN 2.9* 2.3* 2.1*    Recent Results (from the past 240 hour(s))  Culture, blood (Routine x 2)     Status: None (Preliminary result)   Collection Time: 09/24/20  1:20 PM   Specimen: BLOOD RIGHT HAND  Result Value Ref Range Status   Specimen Description BLOOD RIGHT HAND  Final   Special Requests   Final    BOTTLES DRAWN AEROBIC AND ANAEROBIC Blood Culture results may not be optimal due to an excessive volume of blood received in culture bottles   Culture   Final    NO GROWTH 4  DAYS Performed at [facility], [place] [address] [place], [place] [address]    Report Status PENDING  Incomplete  Culture, blood (Routine x 2)     Status: None (Preliminary result)   Collection Time: 09/24/20  2:04 PM   Specimen: BLOOD RIGHT FOREARM  Result Value Ref Range Status   Specimen Description BLOOD RIGHT FOREARM  Final   Special Requests   Final    BOTTLES DRAWN AEROBIC AND ANAEROBIC Blood Culture adequate volume   Culture   Final    NO GROWTH 4 DAYS Performed at [facility], [place] [address] [place], [place] [address]    Report Status PENDING  Incomplete  Resp Panel by RT-PCR (Flu A&B, Covid) Nasopharyngeal Swab     Status: None   Collection Time: 09/24/20  3:23 PM   Specimen: Nasopharyngeal Swab; Nasopharyngeal(NP) swabs in vial transport medium  Result Value Ref Range Status   SARS Coronavirus 2 by RT PCR NEGATIVE NEGATIVE Final    Comment: (NOTE) SARS-CoV-2 target nucleic acids are NOT DETECTED.  The SARS-CoV-2 RNA is generally detectable in upper  respiratory specimens during the acute phase of infection. The lowest concentration of SARS-CoV-2 viral copies this assay can detect is 138 copies/mL. A negative result does not preclude SARS-Cov-2 infection and should not be used as the sole basis for treatment or other patient management decisions. A negative result may occur with  improper specimen collection/handling, submission of specimen other than nasopharyngeal swab, presence of viral mutation(s) within the areas targeted by this assay, and inadequate number of viral copies(<138 copies/mL). A negative result must be combined with clinical observations, patient history, and epidemiological information. The expected result is Negative.  Fact Sheet for Patients:  EntrepreneurPulse.com.au  Fact Sheet for Healthcare Providers:  IncredibleEmployment.be  This test is no t yet approved or cleared by the [place] FDA and  has been authorized for detection and/or diagnosis of SARS-CoV-2 by FDA under an Emergency Use Authorization (EUA). This EUA will remain  in effect (meaning this test can be used) for the duration of the COVID-19 declaration under Section 564(b)(1) of the Act, 21 U.S.C.section 360bbb-3(b)(1), unless the authorization is terminated  or revoked sooner.       Influenza A by PCR NEGATIVE NEGATIVE Final   Influenza B by PCR NEGATIVE NEGATIVE Final    Comment: (NOTE) The Xpert Xpress SARS-CoV-2/FLU/RSV plus assay is intended as an aid in the diagnosis of influenza from Nasopharyngeal swab specimens and should not be used as a sole basis for treatment. Nasal washings and aspirates are unacceptable for Xpert Xpress SARS-CoV-2/FLU/RSV testing.  Fact Sheet for Patients: EntrepreneurPulse.com.au  Fact Sheet for Healthcare Providers: IncredibleEmployment.be  This test is not yet approved or cleared by the [place] FDA and has been  authorized for detection and/or diagnosis of SARS-CoV-2 by FDA under an Emergency Use Authorization (EUA). This EUA will remain in effect (meaning this test can be used) for the duration of the COVID-19 declaration under Section 564(b)(1) of the Act, 21 U.S.C. section 360bbb-3(b)(1), unless the authorization is terminated or revoked.  Performed at [facility], [place] [address] [place], [place] [address]          Radiology Studies: VAS [place] LOWER EXTREMITY VENOUS (DVT)  Result Date: 09/28/2020  Lower Venous DVT Study Indications: Swelling, and Edema.  Comparison Study: previous 06/15/14 Performing Technologist: Abram Sander RVS  Examination Guidelines: A complete evaluation includes B-mode imaging, spectral Doppler, color Doppler, and power Doppler  as needed of all accessible portions of each vessel. Bilateral testing is considered an integral part of a complete examination. Limited examinations for reoccurring indications may be performed as noted. The reflux portion of the exam is performed with the patient in reverse Trendelenburg.  +---------+---------------+---------+-----------+----------+-------------------+ RIGHT    CompressibilityPhasicitySpontaneityPropertiesThrombus Aging      +---------+---------------+---------+-----------+----------+-------------------+ CFV      Full           Yes      Yes                                      +---------+---------------+---------+-----------+----------+-------------------+ SFJ      Full                                                             +---------+---------------+---------+-----------+----------+-------------------+ FV Prox  Full                                                             +---------+---------------+---------+-----------+----------+-------------------+ FV Mid   Full                                                              +---------+---------------+---------+-----------+----------+-------------------+ FV DistalFull                                                             +---------+---------------+---------+-----------+----------+-------------------+ PFV      Full                                                             +---------+---------------+---------+-----------+----------+-------------------+ POP      Full           Yes      Yes                                      +---------+---------------+---------+-----------+----------+-------------------+ PTV      Full                                                             +---------+---------------+---------+-----------+----------+-------------------+ PERO  Not well visualized +---------+---------------+---------+-----------+----------+-------------------+   +---------+---------------+---------+-----------+----------+-------------------+ LEFT     CompressibilityPhasicitySpontaneityPropertiesThrombus Aging      +---------+---------------+---------+-----------+----------+-------------------+ CFV      Full           Yes      Yes                                      +---------+---------------+---------+-----------+----------+-------------------+ SFJ      Full                                                             +---------+---------------+---------+-----------+----------+-------------------+ FV Prox  Full                                                             +---------+---------------+---------+-----------+----------+-------------------+ FV Mid   Full                                                             +---------+---------------+---------+-----------+----------+-------------------+ FV DistalFull                                                             +---------+---------------+---------+-----------+----------+-------------------+  PFV      Full                                                             +---------+---------------+---------+-----------+----------+-------------------+ POP      Full           Yes      Yes                                      +---------+---------------+---------+-----------+----------+-------------------+ PTV                                                   Not well visualized +---------+---------------+---------+-----------+----------+-------------------+ PERO                                                  Not well visualized +---------+---------------+---------+-----------+----------+-------------------+     Summary: BILATERAL: - No evidence of deep vein thrombosis seen in the  lower extremities, bilaterally. - No evidence of superficial venous thrombosis in the lower extremities, bilaterally. -No evidence of popliteal cyst, bilaterally.   *See table(s) above for measurements and observations.    Preliminary         Scheduled Meds: . amiodarone  200 mg Oral Daily  . apixaban  5 mg Oral BID  . ferrous sulfate  325 mg Oral Q breakfast  . furosemide  40 mg Intravenous Q12H  . metoprolol tartrate  50 mg Oral BID   Continuous Infusions: . azithromycin 500 mg (09/28/20 1546)  . cefTRIAXone (ROCEPHIN)  IV 2 g (09/28/20 1507)  . vancomycin 2,000 mg (09/28/20 1228)          Aline August, MD [facility] 09/29/2020, 7:55 AM

## 2020-09-30 DIAGNOSIS — L03115 Cellulitis of right lower limb: Secondary | ICD-10-CM | POA: Diagnosis not present

## 2020-09-30 DIAGNOSIS — I5033 Acute on chronic diastolic (congestive) heart failure: Secondary | ICD-10-CM | POA: Diagnosis not present

## 2020-09-30 DIAGNOSIS — I480 Paroxysmal atrial fibrillation: Secondary | ICD-10-CM | POA: Diagnosis not present

## 2020-09-30 DIAGNOSIS — A419 Sepsis, unspecified organism: Secondary | ICD-10-CM | POA: Diagnosis not present

## 2020-09-30 LAB — CBC WITH DIFFERENTIAL/PLATELET
Abs Immature Granulocytes: 0.1 10*3/uL — ABNORMAL HIGH (ref 0.00–0.07)
Basophils Absolute: 0.1 10*3/uL (ref 0.0–0.1)
Basophils Relative: 1 %
Eosinophils Absolute: 0.4 10*3/uL (ref 0.0–0.5)
Eosinophils Relative: 4 %
HCT: 36.6 % — ABNORMAL LOW (ref 39.0–52.0)
Hemoglobin: 11.5 g/dL — ABNORMAL LOW (ref 13.0–17.0)
Immature Granulocytes: 1 %
Lymphocytes Relative: 28 %
Lymphs Abs: 2.6 10*3/uL (ref 0.7–4.0)
MCH: 27.5 pg (ref 26.0–34.0)
MCHC: 31.4 g/dL (ref 30.0–36.0)
MCV: 87.6 fL (ref 80.0–100.0)
Monocytes Absolute: 1.1 10*3/uL — ABNORMAL HIGH (ref 0.1–1.0)
Monocytes Relative: 12 %
Neutro Abs: 4.9 10*3/uL (ref 1.7–7.7)
Neutrophils Relative %: 54 %
Platelets: 256 10*3/uL (ref 150–400)
RBC: 4.18 MIL/uL — ABNORMAL LOW (ref 4.22–5.81)
RDW: 16.2 % — ABNORMAL HIGH (ref 11.5–15.5)
WBC: 9.2 10*3/uL (ref 4.0–10.5)
nRBC: 0 % (ref 0.0–0.2)

## 2020-09-30 LAB — BASIC METABOLIC PANEL
Anion gap: 13 (ref 5–15)
BUN: 14 mg/dL (ref 8–23)
CO2: 32 mmol/L (ref 22–32)
Calcium: 9.5 mg/dL (ref 8.9–10.3)
Chloride: 92 mmol/L — ABNORMAL LOW (ref 98–111)
Creatinine, Ser: 0.92 mg/dL (ref 0.61–1.24)
GFR, Estimated: >60 mL/min (ref >60)
Glucose, Bld: 98 mg/dL (ref 70–99)
Potassium: 3.7 mmol/L (ref 3.5–5.1)
Sodium: 137 mmol/L (ref 135–145)

## 2020-09-30 LAB — C-REACTIVE PROTEIN: CRP: 15.5 mg/dL — ABNORMAL HIGH (ref <1.0)

## 2020-09-30 LAB — MAGNESIUM: Magnesium: 1.6 mg/dL — ABNORMAL LOW (ref 1.7–2.4)

## 2020-09-30 NOTE — Progress Notes (Signed)
Patient ID: Shane Diaz, male   DOB: [birth year], 75 y.o.   MRN: [id number]  PROGRESS NOTE    Zavior Thomason  JSE:[id number] DOB: 1945/12/21 DOA: 09/24/2020 PCP: [name], MD   Brief Narrative:  75 year old male with history of hypertension, hyperlipidemia, obstructive sleep apnea, chronic hypoxic respiratory failure on 3 L oxygen via nasal cannula at home, paroxysmal A. fib, chronic diastolic CHF, obesity presented with generalized weakness or shortness of breath and fever.  Presentation, he was febrile to 103.1 and initially hypotensive with a blood pressure of 87/49 which improved to 101/58 after fluid resuscitation.  WBCs of 17.4, lactic acid of 2.3 which improved to 2.1 after fluid resuscitation.  COVID-19 and influenza testing were negative.  Chest x-ray showed left lower lobe opacities concerning for pneumonia.  He was started on Rocephin and Zithromax.  Assessment & Plan:   Severe sepsis: Present on admission, possibly from below Community-acquired bacterial pneumonia Bilateral lower extremity cellulitis Leukocytosis -Patient presented with generalized weakness and was febrile, hypotensive with leukocytosis and lactic acid of 2.3 which improved with IV fluids and chest x-ray showing possible left lower lobe pneumonia.  He also has bilateral lower extremity cellulitis.  COVID-19 and flu testing were negative -Cultures negative so far.  Sepsis has resolved -Currently on Rocephin and vancomycin.  Off Zithromax. -WBCs normalized.  Currently afebrile. CRP elevated at 15.5 today.  Acute on chronic hypoxic respiratory failure -Probably from pneumonia and heart failure.  Was requiring up to 6 L nasal cannula oxygen; currently on 5 L oxygen via nasal cannula. Wean off as able  Paroxysmal A. Fib -Currently rate controlled.  Continue amiodarone, Eliquis and metoprolol  Essential hypertension -Blood pressure improved.  Continue metoprolol and oral Lasix  Acute on chronic diastolic heart  failure -Strict input and output.  Daily weights.  Fluid restriction.  Negative balance of 5569.6 cc since admission.  Cardiology signed off on 09/29/2020 and Lasix has been switched to once daily orally  Morbid obesity-outpatient follow-up   DVT prophylaxis: Eliquis Code Status: Full Family Communication: called daughter/Dianna on phone on 09/28/20 but she didn't pick up Disposition Plan: Status is: Inpatient  Remains inpatient appropriate because:Inpatient level of care appropriate due to severity of illness   Dispo: The patient is from: Home              Anticipated d/c is to: Home              Anticipated d/c date is: 2 days              Patient currently is not medically stable to d/c.   Difficult to place patient No  Consultants: Cardiology  Procedures: None  Antimicrobials: Rocephin and Zithromax from 09/24/20 onwards.  Vancomycin from 09/27/2020 onwards   Subjective: Patient seen and examined at bedside. Still short of breath with minimal exertion. Bilateral lower extremity swelling and pain are improving. No overnight fever or vomiting reported  objective: Vitals:   09/30/20 0050 09/30/20 0338 09/30/20 0357 09/30/20 0359  BP: 123/62 126/66 (!) 125/58   Pulse: 70 72 74   Resp: 20 19 20    Temp: 99.3 F (37.4 C) 98.1 F (36.7 C) 98.2 F (36.8 C)   TempSrc: Oral Oral Oral   SpO2: 99% 97% 99%   Weight:    124.1 kg  Height:        Intake/Output Summary (Last 24 hours) at 09/30/2020 0750 Last data filed at 09/30/2020 0338 Gross per 24 hour  Intake --  Output  1500 ml  Net -1500 ml   Filed Weights   09/28/20 0351 09/29/20 0700 09/30/20 0359  Weight: 125.5 kg (P) 118.4 kg 124.1 kg    Examination:  General exam:   Currently on 5 L oxygen via nasal cannula. Chronically ill looking. Respiratory system: Decreased breath sounds at bases bilaterally with some crackles, no wheezing  cardiovascular system: Rate controlled, S1-S2 heard gastrointestinal system: Abdomen  is morbidly obese, distended, soft and nontender. Normal bowel sounds are heard  extremities: Bilateral lower extremity 1+ edema present; no clubbing  Central nervous system: Alert and oriented. No focal neurological deficits. Moves extremities  skin: Bilateral chronic skin changes of lower extremities with venous stasis dermatitis with erythema and tenderness of bilateral lower extremities present but improving Psychiatry: Intermittently gets anxious   Data Reviewed: I have personally reviewed following labs and imaging studies  CBC: Recent Labs  Lab 09/26/20 0425 09/27/20 0304 09/28/20 0408 09/29/20 0321 09/30/20 0251  WBC 12.6* 9.8 8.8 8.7 9.2  NEUTROABS 8.5* 6.1 5.3 5.2 4.9  HGB 10.3* 10.6* 11.8* 11.9* 11.5*  HCT 33.1* 33.5* 37.0* 37.7* 36.6*  MCV 89.7 88.6 86.7 86.3 87.6  PLT 207 209 252 235 734   Basic Metabolic Panel: Recent Labs  Lab 09/26/20 0425 09/27/20 0304 09/28/20 0408 09/29/20 0321 09/30/20 0251  NA 137 135 135 134* 137  K 4.0 4.2 3.6 3.6 3.7  CL 99 97* 93* 92* 92*  CO2 27 28 32 32 32  GLUCOSE 106* 105* 98 97 98  BUN 16 16 13 12 14   CREATININE 0.74 0.80 0.97 0.89 0.92  CALCIUM 9.4 9.4 9.4 9.4 9.5  MG 1.7 1.6* 1.7 1.7 1.6*   GFR: Estimated Creatinine Clearance: 90.4 mL/min (by C-G formula based on SCr of 0.92 mg/dL). Liver Function Tests: Recent Labs  Lab 09/24/20 1310  AST 30  ALT 18  ALKPHOS 104  BILITOT 0.4  PROT 7.1  ALBUMIN 3.3*   No results for input(s): LIPASE, AMYLASE in the last 168 hours. No results for input(s): AMMONIA in the last 168 hours. Coagulation Profile: Recent Labs  Lab 09/24/20 1310  INR 1.2   Cardiac Enzymes: No results for input(s): CKTOTAL, CKMB, CKMBINDEX, TROPONINI in the last 168 hours. BNP (last 3 results) No results for input(s): PROBNP in the last 8760 hours. HbA1C: No results for input(s): HGBA1C in the last 72 hours. CBG: No results for input(s): GLUCAP in the last 168 hours. Lipid Profile: No  results for input(s): CHOL, HDL, LDLCALC, TRIG, CHOLHDL, LDLDIRECT in the last 72 hours. Thyroid Function Tests: No results for input(s): TSH, T4TOTAL, FREET4, T3FREE, THYROIDAB in the last 72 hours. Anemia Panel: No results for input(s): VITAMINB12, FOLATE, FERRITIN, TIBC, IRON, RETICCTPCT in the last 72 hours. Sepsis Labs: Recent Labs  Lab 09/24/20 1340 09/24/20 1404 09/24/20 1622  LATICACIDVEN 2.9* 2.3* 2.1*    Recent Results (from the past 240 hour(s))  Culture, blood (Routine x 2)     Status: None   Collection Time: 09/24/20  1:20 PM   Specimen: BLOOD RIGHT HAND  Result Value Ref Range Status   Specimen Description BLOOD RIGHT HAND  Final   Special Requests   Final    BOTTLES DRAWN AEROBIC AND ANAEROBIC Blood Culture results may not be optimal due to an excessive volume of blood received in culture bottles   Culture   Final    NO GROWTH 5 DAYS Performed at [facility], [place] [address] [place], [place] [address]  Report Status 09/29/2020 FINAL  Final  Culture, blood (Routine x 2)     Status: None   Collection Time: 09/24/20  2:04 PM   Specimen: BLOOD RIGHT FOREARM  Result Value Ref Range Status   Specimen Description BLOOD RIGHT FOREARM  Final   Special Requests   Final    BOTTLES DRAWN AEROBIC AND ANAEROBIC Blood Culture adequate volume   Culture   Final    NO GROWTH 5 DAYS Performed at [facility], [address] [place], [place] [address]    Report Status 09/29/2020 FINAL  Final  Resp Panel by RT-PCR (Flu A&B, Covid) Nasopharyngeal Swab     Status: None   Collection Time: 09/24/20  3:23 PM   Specimen: Nasopharyngeal Swab; Nasopharyngeal(NP) swabs in vial transport medium  Result Value Ref Range Status   SARS Coronavirus 2 by RT PCR NEGATIVE NEGATIVE Final    Comment: (NOTE) SARS-CoV-2 target nucleic acids are NOT DETECTED.  The SARS-CoV-2 RNA is generally detectable in upper respiratory specimens during the acute phase of infection. The  lowest concentration of SARS-CoV-2 viral copies this assay can detect is 138 copies/mL. A negative result does not preclude SARS-Cov-2 infection and should not be used as the sole basis for treatment or other patient management decisions. A negative result may occur with  improper specimen collection/handling, submission of specimen other than nasopharyngeal swab, presence of viral mutation(s) within the areas targeted by this assay, and inadequate number of viral copies(<138 copies/mL). A negative result must be combined with clinical observations, patient history, and epidemiological information. The expected result is Negative.  Fact Sheet for Patients:  EntrepreneurPulse.com.au  Fact Sheet for Healthcare Providers:  IncredibleEmployment.be  This test is no t yet approved or cleared by the [place] FDA and  has been authorized for detection and/or diagnosis of SARS-CoV-2 by FDA under an Emergency Use Authorization (EUA). This EUA will remain  in effect (meaning this test can be used) for the duration of the COVID-19 declaration under Section 564(b)(1) of the Act, 21 U.S.C.section 360bbb-3(b)(1), unless the authorization is terminated  or revoked sooner.       Influenza A by PCR NEGATIVE NEGATIVE Final   Influenza B by PCR NEGATIVE NEGATIVE Final    Comment: (NOTE) The Xpert Xpress SARS-CoV-2/FLU/RSV plus assay is intended as an aid in the diagnosis of influenza from Nasopharyngeal swab specimens and should not be used as a sole basis for treatment. Nasal washings and aspirates are unacceptable for Xpert Xpress SARS-CoV-2/FLU/RSV testing.  Fact Sheet for Patients: EntrepreneurPulse.com.au  Fact Sheet for Healthcare Providers: IncredibleEmployment.be  This test is not yet approved or cleared by the [place] FDA and has been authorized for detection and/or diagnosis of SARS-CoV-2 by FDA under  an Emergency Use Authorization (EUA). This EUA will remain in effect (meaning this test can be used) for the duration of the COVID-19 declaration under Section 564(b)(1) of the Act, 21 U.S.C. section 360bbb-3(b)(1), unless the authorization is terminated or revoked.  Performed at [facility], [place] [address] [place], [place] [address]          Radiology Studies: VAS [place] LOWER EXTREMITY VENOUS (DVT)  Result Date: 09/29/2020  Lower Venous DVT Study Indications: Swelling, and Edema.  Comparison Study: previous 06/15/14 Performing Technologist: Abram Sander RVS  Examination Guidelines: A complete evaluation includes B-mode imaging, spectral Doppler, color Doppler, and power Doppler as needed of all accessible portions of each vessel. Bilateral testing is considered an integral part of  a complete examination. Limited examinations for reoccurring indications may be performed as noted. The reflux portion of the exam is performed with the patient in reverse Trendelenburg.  +---------+---------------+---------+-----------+----------+-------------------+ RIGHT    CompressibilityPhasicitySpontaneityPropertiesThrombus Aging      +---------+---------------+---------+-----------+----------+-------------------+ CFV      Full           Yes      Yes                                      +---------+---------------+---------+-----------+----------+-------------------+ SFJ      Full                                                             +---------+---------------+---------+-----------+----------+-------------------+ FV Prox  Full                                                             +---------+---------------+---------+-----------+----------+-------------------+ FV Mid   Full                                                             +---------+---------------+---------+-----------+----------+-------------------+ FV DistalFull                                                              +---------+---------------+---------+-----------+----------+-------------------+ PFV      Full                                                             +---------+---------------+---------+-----------+----------+-------------------+ POP      Full           Yes      Yes                                      +---------+---------------+---------+-----------+----------+-------------------+ PTV      Full                                                             +---------+---------------+---------+-----------+----------+-------------------+ PERO  Not well visualized +---------+---------------+---------+-----------+----------+-------------------+   +---------+---------------+---------+-----------+----------+-------------------+ LEFT     CompressibilityPhasicitySpontaneityPropertiesThrombus Aging      +---------+---------------+---------+-----------+----------+-------------------+ CFV      Full           Yes      Yes                                      +---------+---------------+---------+-----------+----------+-------------------+ SFJ      Full                                                             +---------+---------------+---------+-----------+----------+-------------------+ FV Prox  Full                                                             +---------+---------------+---------+-----------+----------+-------------------+ FV Mid   Full                                                             +---------+---------------+---------+-----------+----------+-------------------+ FV DistalFull                                                             +---------+---------------+---------+-----------+----------+-------------------+ PFV      Full                                                              +---------+---------------+---------+-----------+----------+-------------------+ POP      Full           Yes      Yes                                      +---------+---------------+---------+-----------+----------+-------------------+ PTV                                                   Not well visualized +---------+---------------+---------+-----------+----------+-------------------+ PERO                                                  Not well visualized +---------+---------------+---------+-----------+----------+-------------------+     Summary: BILATERAL: - No evidence of deep vein thrombosis seen in the  lower extremities, bilaterally. - No evidence of superficial venous thrombosis in the lower extremities, bilaterally. -No evidence of popliteal cyst, bilaterally.   *See table(s) above for measurements and observations. Electronically signed by Ruta Hinds MD on 09/29/2020 at 10:46:12 AM.    Final         Scheduled Meds: . amiodarone  200 mg Oral Daily  . apixaban  5 mg Oral BID  . ferrous sulfate  325 mg Oral Q breakfast  . furosemide  40 mg Oral Daily  . metoprolol tartrate  50 mg Oral BID   Continuous Infusions: . cefTRIAXone (ROCEPHIN)  IV 2 g (09/29/20 1419)  . vancomycin 2,000 mg (09/29/20 1213)          Aline August, MD [facility] 09/30/2020, 7:50 AM

## 2020-09-30 NOTE — Progress Notes (Signed)
Pharmacy Antibiotic Note  Shane Diaz is a 75 y.o. male admitted on 09/24/2020 with cellulitis.  Pharmacy has been consulted for Vancomycin dosing.  ID: Sepsis coverage and bilat LE cellulitis/CAP.  - 2/24 adding Vanc for worsening cellulitis BL LE  Afb, WBC WNL, Scr WNL  LA 2.9>>2.1  Azithro 2/21 >> (2/26) CTX 2/21 >> (2/28) Vanc 2/24 >>  2/21 blood x 2>>neg 2/21 COVID and flu: negative  Plan: Vancomycin 2gm IV Q 24 hrs. Goal AUC 400-550. Expected AUC: 448 SCr used: 0.8 BMI 43 > Vd used 0.5 No trough as likely home within the next 48 hrs per MD note.   Height: 5\' 8"  (172.7 cm) Weight: 124.1 kg (273 lb 9.5 oz) IBW/kg (Calculated) : 68.4  Temp (24hrs), Avg:98.7 F (37.1 C), Min:98.1 F (36.7 C), Max:99.6 F (37.6 C)  Recent Labs  Lab 09/24/20 1340 09/24/20 1404 09/24/20 1622 09/25/20 0359 09/26/20 0425 09/27/20 0304 09/28/20 0408 09/29/20 0321 09/30/20 0251  WBC  --   --   --    < > 12.6* 9.8 8.8 8.7 9.2  CREATININE  --   --   --    < > 0.74 0.80 0.97 0.89 0.92  LATICACIDVEN 2.9* 2.3* 2.1*  --   --   --   --   --   --    < > = values in this interval not displayed.    Estimated Creatinine Clearance: 90.4 mL/min (by C-G formula based on SCr of 0.92 mg/dL).    No Known Allergies  Shane Diaz Shane Diaz, PharmD, BCPS Clinical Staff Pharmacist [email or web address]  Shane Diaz 09/30/2020 9:33 AM

## 2020-09-30 NOTE — Progress Notes (Signed)
Orthopedic Tech Progress Note Patient Details:  Shane Diaz 1946/03/26 [id number]  Ortho Devices Type of Ortho Device: Haematologist Ortho Device/Splint Location: Bilateral Lower Extremity Ortho Device/Splint Interventions: Ordered,Application   Post Interventions Patient Tolerated: Well Instructions Provided: Care of device,Poper ambulation with device   [name] 09/30/2020, 6:47 PM

## 2020-10-01 DIAGNOSIS — I5033 Acute on chronic diastolic (congestive) heart failure: Secondary | ICD-10-CM | POA: Diagnosis not present

## 2020-10-01 DIAGNOSIS — I480 Paroxysmal atrial fibrillation: Secondary | ICD-10-CM | POA: Diagnosis not present

## 2020-10-01 DIAGNOSIS — L03115 Cellulitis of right lower limb: Secondary | ICD-10-CM | POA: Diagnosis not present

## 2020-10-01 DIAGNOSIS — A419 Sepsis, unspecified organism: Secondary | ICD-10-CM | POA: Diagnosis not present

## 2020-10-01 LAB — BASIC METABOLIC PANEL
Anion gap: 13 (ref 5–15)
BUN: 13 mg/dL (ref 8–23)
CO2: 33 mmol/L — ABNORMAL HIGH (ref 22–32)
Calcium: 9.5 mg/dL (ref 8.9–10.3)
Chloride: 91 mmol/L — ABNORMAL LOW (ref 98–111)
Creatinine, Ser: 0.92 mg/dL (ref 0.61–1.24)
GFR, Estimated: >60 mL/min (ref >60)
Glucose, Bld: 100 mg/dL — ABNORMAL HIGH (ref 70–99)
Potassium: 3.8 mmol/L (ref 3.5–5.1)
Sodium: 137 mmol/L (ref 135–145)

## 2020-10-01 LAB — C-REACTIVE PROTEIN: CRP: 11.1 mg/dL — ABNORMAL HIGH (ref <1.0)

## 2020-10-01 LAB — CBC WITH DIFFERENTIAL/PLATELET
Abs Immature Granulocytes: 0.11 10*3/uL — ABNORMAL HIGH (ref 0.00–0.07)
Basophils Absolute: 0.1 10*3/uL (ref 0.0–0.1)
Basophils Relative: 1 %
Eosinophils Absolute: 0.5 10*3/uL (ref 0.0–0.5)
Eosinophils Relative: 6 %
HCT: 38.1 % — ABNORMAL LOW (ref 39.0–52.0)
Hemoglobin: 11.5 g/dL — ABNORMAL LOW (ref 13.0–17.0)
Immature Granulocytes: 1 %
Lymphocytes Relative: 28 %
Lymphs Abs: 2.4 10*3/uL (ref 0.7–4.0)
MCH: 27.1 pg (ref 26.0–34.0)
MCHC: 30.2 g/dL (ref 30.0–36.0)
MCV: 89.9 fL (ref 80.0–100.0)
Monocytes Absolute: 1.1 10*3/uL — ABNORMAL HIGH (ref 0.1–1.0)
Monocytes Relative: 13 %
Neutro Abs: 4.6 10*3/uL (ref 1.7–7.7)
Neutrophils Relative %: 51 %
Platelets: 272 10*3/uL (ref 150–400)
RBC: 4.24 MIL/uL (ref 4.22–5.81)
RDW: 16 % — ABNORMAL HIGH (ref 11.5–15.5)
WBC: 8.8 10*3/uL (ref 4.0–10.5)
nRBC: 0 % (ref 0.0–0.2)

## 2020-10-01 LAB — MAGNESIUM: Magnesium: 1.6 mg/dL — ABNORMAL LOW (ref 1.7–2.4)

## 2020-10-01 MED ORDER — MAGNESIUM SULFATE 2 GM/50ML IV SOLN
2.0000 g | Freq: Once | INTRAVENOUS | Status: AC
  Administered 2020-10-01: 2 g via INTRAVENOUS
  Filled 2020-10-01: qty 50

## 2020-10-01 NOTE — Progress Notes (Signed)
Physical Therapy Treatment Patient Details Name: Shane Diaz MRN: [id number] DOB: [birth year] Today's Date: 10/01/2020    History of Present Illness Pt is a 75 y.o. male who presents with SOB, fever/chills, fall, generalized weakness, and lower extremity edema. Upon arrival to hospital, pt found to be hypotensive. Pt with sepsis likely due to combination of R lower extremity cellulitis and pneumonia likely gram-positive. CT of head negative for acute intracranial pathology. PMH: OSA, HTN, paroxysmal a-fib, chronic diastolic congestive heart failure, and obesity.    PT Comments    Pt asking to ambulate with PT today. Pt tolerated 2 bouts of ambulation of 42' with RW today. Pt limited by worsening bilat LE pain and pins and needles feeling. Verbal cues for safe walker management especially during turns to stay in walker, pt improved with second bout. Encourage pt to complete his bilat LE exercises while up in chair. Acute PT to cont to follow.   Follow Up Recommendations  No PT follow up     Equipment Recommendations  Rolling walker with 5" wheels    Recommendations for Other Services       Precautions / Restrictions Precautions Precautions: Fall Required Braces or Orthoses: Other Brace Other Brace: bilat unna boots Restrictions Weight Bearing Restrictions: No    Mobility  Bed Mobility Overal bed mobility: Modified Independent             General bed mobility comments: HOB elevated, used bed rails, labored effort due to body habitus    Transfers Overall transfer level: Needs assistance Equipment used: Rolling walker (2 wheeled) Transfers: Sit to/from Stand Sit to Stand: Min assist;Mod assist         General transfer comment: pt requiring modA to power up from lower surface height, once bed raised minA, pt with difficulty powering up, trying to pull up on RW despite education not too  Ambulation/Gait Ambulation/Gait assistance: Min guard Gait Distance (Feet): 50  Feet (x2) Assistive device: Rolling walker (2 wheeled) Gait Pattern/deviations: Step-through pattern Gait velocity: redu Gait velocity interpretation: <1.31 ft/sec, indicative of household ambulator General Gait Details: pt with bilat LE externally rotated, limited tolerance due to worsening pins and needs in bilat feet especially toes per patient, completed 2 bouts with 3 min seated rest break in between   Stairs             Wheelchair Mobility    Modified Rankin (Stroke Patients Only) Modified Rankin (Stroke Patients Only) Pre-Morbid Rankin Score: No symptoms Modified Rankin: No significant disability     Balance Overall balance assessment: Mild deficits observed, not formally tested                                          Cognition Arousal/Alertness: Awake/alert Behavior During Therapy: WFL for tasks assessed/performed Overall Cognitive Status: Within Functional Limits for tasks assessed                                        Exercises      General Comments General comments (skin integrity, edema, etc.): pt with bilat unna boots, pt with overall dry flaky skin      Pertinent Vitals/Pain Pain Assessment: Faces Faces Pain Scale: Hurts even more Pain Location: bilat lower legs Pain Descriptors / Indicators: Tingling;Pins and needles Pain Intervention(s): Limited activity  within patient's tolerance    Home Living                      Prior Function            PT Goals (current goals can now be found in the care plan section) Progress towards PT goals: Progressing toward goals    Frequency    Min 3X/week      PT Plan Current plan remains appropriate    Co-evaluation              AM-PAC PT "6 Clicks" Mobility   Outcome Measure  Help needed turning from your back to your side while in a flat bed without using bedrails?: A Little Help needed moving from lying on your back to sitting on the side of  a flat bed without using bedrails?: A Little Help needed moving to and from a bed to a chair (including a wheelchair)?: A Little Help needed standing up from a chair using your arms (e.g., wheelchair or bedside chair)?: A Little Help needed to walk in hospital room?: A Little Help needed climbing 3-5 steps with a railing? : A Little 6 Click Score: 18    End of Session Equipment Utilized During Treatment: Oxygen (4LO2 via [place]) Activity Tolerance: Patient tolerated treatment well Patient left: in chair;with call bell/phone within reach;with chair alarm set Nurse Communication: Mobility status;Other (comment) PT Visit Diagnosis: Unsteadiness on feet (R26.81);History of falling (Z91.81);Muscle weakness (generalized) (M62.81);Other abnormalities of gait and mobility (R26.89);Pain Pain - part of body: Leg     Time: 0903-0930 PT Time Calculation (min) (ACUTE ONLY): 27 min  Charges:  $Gait Training: 23-37 mins                     Kittie Plater, PT, DPT Acute Rehabilitation Services Pager #: [phone] Office #: [phone]    Berline Lopes 10/01/2020, 9:48 AM

## 2020-10-01 NOTE — Plan of Care (Signed)

## 2020-10-01 NOTE — Progress Notes (Signed)
SATURATION QUALIFICATIONS: (This note is used to comply with regulatory documentation for home oxygen)  Patient Saturations on Room Air at Rest = 90%  Patient Saturations on Room Air while Ambulating = 88%  Patient Saturations on 2 Liters of oxygen while Ambulating = 94%  Please briefly explain why patient needs home oxygen: He does desat and becomes SOB easily when he is on room air.  He becomes anxious and says "give me oxygen hurry"

## 2020-10-01 NOTE — Progress Notes (Signed)
Patient ID: Shane Diaz, male   DOB: [birth year], 75 y.o.   MRN: [id number]  PROGRESS NOTE    Shane Diaz  XNT:[id number] DOB: May 25, 1946 DOA: 09/24/2020 PCP: [name], MD   Brief Narrative:  75 year old male with history of hypertension, hyperlipidemia, obstructive sleep apnea, chronic hypoxic respiratory failure on 3 L oxygen via nasal cannula at home, paroxysmal A. fib, chronic diastolic CHF, obesity presented with generalized weakness or shortness of breath and fever.  Presentation, he was febrile to 103.1 and initially hypotensive with a blood pressure of 87/49 which improved to 101/58 after fluid resuscitation.  WBCs of 17.4, lactic acid of 2.3 which improved to 2.1 after fluid resuscitation.  COVID-19 and influenza testing were negative.  Chest x-ray showed left lower lobe opacities concerning for pneumonia.  He was started on Rocephin and Zithromax.  Assessment & Plan:   Severe sepsis: Present on admission, possibly from below Community-acquired bacterial pneumonia Bilateral lower extremity cellulitis Leukocytosis -Patient presented with generalized weakness and was febrile, hypotensive with leukocytosis and lactic acid of 2.3 which improved with IV fluids and chest x-ray showing possible left lower lobe pneumonia.  He also has bilateral lower extremity cellulitis.  COVID-19 and flu testing were negative -Cultures negative so far.  Sepsis has resolved -Currently on Rocephin and vancomycin.  Off Zithromax. -WBCs normalized.  Currently afebrile. CRP improving to 11.1 today compared to 15.5 yesterday. -Unna boots applied yesterday.  Acute on chronic hypoxic respiratory failure -Probably from pneumonia and heart failure.  Was requiring up to 6 L nasal cannula oxygen; currently intermittently on 2 L oxygen via nasal cannula.  Wean off as able  Paroxysmal A. Fib -Currently rate controlled.  Continue amiodarone, Eliquis and metoprolol  Essential hypertension -Blood pressure  improved.  Continue metoprolol and oral Lasix  Acute on chronic diastolic heart failure -Strict input and output.  Daily weights.  Fluid restriction.  Negative balance of 6219.6 cc since admission.  Cardiology signed off on 09/29/2020 and Lasix has been switched to once daily orally  Morbid obesity-outpatient follow-up   DVT prophylaxis: Eliquis Code Status: Full Family Communication: called daughter/Dianna on phone on 09/28/20 but she didn't pick up Disposition Plan: Status is: Inpatient  Remains inpatient appropriate because:Inpatient level of care appropriate due to severity of illness   Dispo: The patient is from: Home              Anticipated d/c is to: Home              Anticipated d/c date is: 1 day              Patient currently is not medically stable to d/c.   Difficult to place patient No  Consultants: Cardiology  Procedures: None  Antimicrobials: Rocephin and Zithromax from 09/24/20 onwards.  Vancomycin from 09/27/2020 onwards   Subjective: Patient seen and examined at bedside.  Feels that his lower extremity swelling and pain are improving.  Not ready to go home yet.  No overnight fever, vomiting, chest pain.  Still short of breath with minimal exertion.  objective: Vitals:   09/30/20 1943 10/01/20 0001 10/01/20 0338 10/01/20 0745  BP: 129/65 119/68 139/69 128/65  Pulse: 78 70 71 80  Resp: 19 20 18    Temp: 98.8 F (37.1 C) 97.6 F (36.4 C) 98.4 F (36.9 C) 97.9 F (36.6 C)  TempSrc: Oral Oral Oral Oral  SpO2: 100% 96% 97% 95%  Weight:      Height:  Intake/Output Summary (Last 24 hours) at 10/01/2020 0746 Last data filed at 09/30/2020 1257 Gross per 24 hour  Intake -  Output 650 ml  Net -650 ml   Filed Weights   09/28/20 0351 09/29/20 0700 09/30/20 0359  Weight: 125.5 kg (P) 118.4 kg 124.1 kg    Examination:  General exam: Intermittently requiring 2 L oxygen via nasal cannula.  Chronically ill looking.  No distress. Respiratory system:  Bilateral decreased breath sounds at bases with bibasilar crackles cardiovascular system: S1-S2 heard, rate controlled gastrointestinal system: Abdomen is morbidly obese, slightly distended, soft and nontender.  Bowel sounds are heard extremities: Lower extremity edema present bilaterally; no cyanosis Central nervous system: Awake and alert.  No focal neurological deficits.  Moving extremities skin: Unna boots present.   Psychiatry: Flat affect   Data Reviewed: I have personally reviewed following labs and imaging studies  CBC: Recent Labs  Lab 09/27/20 0304 09/28/20 0408 09/29/20 0321 09/30/20 0251 10/01/20 0227  WBC 9.8 8.8 8.7 9.2 8.8  NEUTROABS 6.1 5.3 5.2 4.9 4.6  HGB 10.6* 11.8* 11.9* 11.5* 11.5*  HCT 33.5* 37.0* 37.7* 36.6* 38.1*  MCV 88.6 86.7 86.3 87.6 89.9  PLT 209 252 235 256 045   Basic Metabolic Panel: Recent Labs  Lab 09/27/20 0304 09/28/20 0408 09/29/20 0321 09/30/20 0251 10/01/20 0227  NA 135 135 134* 137 137  K 4.2 3.6 3.6 3.7 3.8  CL 97* 93* 92* 92* 91*  CO2 28 32 32 32 33*  GLUCOSE 105* 98 97 98 100*  BUN 16 13 12 14 13   CREATININE 0.80 0.97 0.89 0.92 0.92  CALCIUM 9.4 9.4 9.4 9.5 9.5  MG 1.6* 1.7 1.7 1.6* 1.6*   GFR: Estimated Creatinine Clearance: 90.4 mL/min (by C-G formula based on SCr of 0.92 mg/dL). Liver Function Tests: Recent Labs  Lab 09/24/20 1310  AST 30  ALT 18  ALKPHOS 104  BILITOT 0.4  PROT 7.1  ALBUMIN 3.3*   No results for input(s): LIPASE, AMYLASE in the last 168 hours. No results for input(s): AMMONIA in the last 168 hours. Coagulation Profile: Recent Labs  Lab 09/24/20 1310  INR 1.2   Cardiac Enzymes: No results for input(s): CKTOTAL, CKMB, CKMBINDEX, TROPONINI in the last 168 hours. BNP (last 3 results) No results for input(s): PROBNP in the last 8760 hours. HbA1C: No results for input(s): HGBA1C in the last 72 hours. CBG: No results for input(s): GLUCAP in the last 168 hours. Lipid Profile: No results for  input(s): CHOL, HDL, LDLCALC, TRIG, CHOLHDL, LDLDIRECT in the last 72 hours. Thyroid Function Tests: No results for input(s): TSH, T4TOTAL, FREET4, T3FREE, THYROIDAB in the last 72 hours. Anemia Panel: No results for input(s): VITAMINB12, FOLATE, FERRITIN, TIBC, IRON, RETICCTPCT in the last 72 hours. Sepsis Labs: Recent Labs  Lab 09/24/20 1340 09/24/20 1404 09/24/20 1622  LATICACIDVEN 2.9* 2.3* 2.1*    Recent Results (from the past 240 hour(s))  Culture, blood (Routine x 2)     Status: None   Collection Time: 09/24/20  1:20 PM   Specimen: BLOOD RIGHT HAND  Result Value Ref Range Status   Specimen Description BLOOD RIGHT HAND  Final   Special Requests   Final    BOTTLES DRAWN AEROBIC AND ANAEROBIC Blood Culture results may not be optimal due to an excessive volume of blood received in culture bottles   Culture   Final    NO GROWTH 5 DAYS Performed at [facility], [place] [address] [place], [place] [address]  Report Status 09/29/2020 FINAL  Final  Culture, blood (Routine x 2)     Status: None   Collection Time: 09/24/20  2:04 PM   Specimen: BLOOD RIGHT FOREARM  Result Value Ref Range Status   Specimen Description BLOOD RIGHT FOREARM  Final   Special Requests   Final    BOTTLES DRAWN AEROBIC AND ANAEROBIC Blood Culture adequate volume   Culture   Final    NO GROWTH 5 DAYS Performed at [facility], [address] [place], [place] [address]    Report Status 09/29/2020 FINAL  Final  Resp Panel by RT-PCR (Flu A&B, Covid) Nasopharyngeal Swab     Status: None   Collection Time: 09/24/20  3:23 PM   Specimen: Nasopharyngeal Swab; Nasopharyngeal(NP) swabs in vial transport medium  Result Value Ref Range Status   SARS Coronavirus 2 by RT PCR NEGATIVE NEGATIVE Final    Comment: (NOTE) SARS-CoV-2 target nucleic acids are NOT DETECTED.  The SARS-CoV-2 RNA is generally detectable in upper respiratory specimens during the acute phase of infection. The  lowest concentration of SARS-CoV-2 viral copies this assay can detect is 138 copies/mL. A negative result does not preclude SARS-Cov-2 infection and should not be used as the sole basis for treatment or other patient management decisions. A negative result may occur with  improper specimen collection/handling, submission of specimen other than nasopharyngeal swab, presence of viral mutation(s) within the areas targeted by this assay, and inadequate number of viral copies(<138 copies/mL). A negative result must be combined with clinical observations, patient history, and epidemiological information. The expected result is Negative.  Fact Sheet for Patients:  EntrepreneurPulse.com.au  Fact Sheet for Healthcare Providers:  IncredibleEmployment.be  This test is no t yet approved or cleared by the [place] FDA and  has been authorized for detection and/or diagnosis of SARS-CoV-2 by FDA under an Emergency Use Authorization (EUA). This EUA will remain  in effect (meaning this test can be used) for the duration of the COVID-19 declaration under Section 564(b)(1) of the Act, 21 U.S.C.section 360bbb-3(b)(1), unless the authorization is terminated  or revoked sooner.       Influenza A by PCR NEGATIVE NEGATIVE Final   Influenza B by PCR NEGATIVE NEGATIVE Final    Comment: (NOTE) The Xpert Xpress SARS-CoV-2/FLU/RSV plus assay is intended as an aid in the diagnosis of influenza from Nasopharyngeal swab specimens and should not be used as a sole basis for treatment. Nasal washings and aspirates are unacceptable for Xpert Xpress SARS-CoV-2/FLU/RSV testing.  Fact Sheet for Patients: EntrepreneurPulse.com.au  Fact Sheet for Healthcare Providers: IncredibleEmployment.be  This test is not yet approved or cleared by the [place] FDA and has been authorized for detection and/or diagnosis of SARS-CoV-2 by FDA under  an Emergency Use Authorization (EUA). This EUA will remain in effect (meaning this test can be used) for the duration of the COVID-19 declaration under Section 564(b)(1) of the Act, 21 U.S.C. section 360bbb-3(b)(1), unless the authorization is terminated or revoked.  Performed at [facility], [place] [address], [place],  [address]          Radiology Studies: No results found.      Scheduled Meds: . amiodarone  200 mg Oral Daily  . apixaban  5 mg Oral BID  . ferrous sulfate  325 mg Oral Q breakfast  . furosemide  40 mg Oral Daily  . metoprolol tartrate  50 mg Oral BID   Continuous Infusions: . cefTRIAXone (ROCEPHIN)  IV 2  g (09/30/20 1516)  . vancomycin 2,000 mg (09/30/20 1257)          Aline August, MD [facility] 10/01/2020, 7:46 AM

## 2020-10-01 NOTE — TOC Progression Note (Signed)
Transition of Care [facility]) - Progression Note    Patient Details  Name: Shane Diaz MRN: [id number] Date of Birth: 1946/03/20  Transition of Care [facility]) CM/SW Contact  Pollie Friar, RN Phone Number: 10/01/2020, 3:15 PM  Clinical Narrative:    Pt qualified for home oxygen. CM has faxed orders to [facility] per request. CM also spoke to [place] with [employer] and he will make sure there is a tank delivered in am for transport home.  TOC following.   Expected Discharge Plan: Home/Self Care Barriers to Discharge: Continued Medical Work up  Expected Discharge Plan and Services Expected Discharge Plan: Home/Self Care   Discharge Planning Services: CM Consult   Living arrangements for the past 2 months: Single Family Home                                       Social Determinants of Health (SDOH) Interventions    Readmission Risk Interventions No flowsheet data found.

## 2020-10-02 DIAGNOSIS — A419 Sepsis, unspecified organism: Secondary | ICD-10-CM | POA: Diagnosis not present

## 2020-10-02 DIAGNOSIS — L03115 Cellulitis of right lower limb: Secondary | ICD-10-CM | POA: Diagnosis not present

## 2020-10-02 DIAGNOSIS — I5033 Acute on chronic diastolic (congestive) heart failure: Secondary | ICD-10-CM | POA: Diagnosis not present

## 2020-10-02 DIAGNOSIS — I480 Paroxysmal atrial fibrillation: Secondary | ICD-10-CM | POA: Diagnosis not present

## 2020-10-02 LAB — CBC WITH DIFFERENTIAL/PLATELET
Abs Immature Granulocytes: 0.08 10*3/uL — ABNORMAL HIGH (ref 0.00–0.07)
Basophils Absolute: 0 10*3/uL (ref 0.0–0.1)
Basophils Relative: 1 %
Eosinophils Absolute: 0.4 10*3/uL (ref 0.0–0.5)
Eosinophils Relative: 5 %
HCT: 36.7 % — ABNORMAL LOW (ref 39.0–52.0)
Hemoglobin: 11.3 g/dL — ABNORMAL LOW (ref 13.0–17.0)
Immature Granulocytes: 1 %
Lymphocytes Relative: 33 %
Lymphs Abs: 2.4 10*3/uL (ref 0.7–4.0)
MCH: 27.2 pg (ref 26.0–34.0)
MCHC: 30.8 g/dL (ref 30.0–36.0)
MCV: 88.2 fL (ref 80.0–100.0)
Monocytes Absolute: 0.8 10*3/uL (ref 0.1–1.0)
Monocytes Relative: 11 %
Neutro Abs: 3.6 10*3/uL (ref 1.7–7.7)
Neutrophils Relative %: 49 %
Platelets: 278 10*3/uL (ref 150–400)
RBC: 4.16 MIL/uL — ABNORMAL LOW (ref 4.22–5.81)
RDW: 15.9 % — ABNORMAL HIGH (ref 11.5–15.5)
WBC: 7.2 10*3/uL (ref 4.0–10.5)
nRBC: 0 % (ref 0.0–0.2)

## 2020-10-02 LAB — BASIC METABOLIC PANEL
Anion gap: 11 (ref 5–15)
BUN: 12 mg/dL (ref 8–23)
CO2: 33 mmol/L — ABNORMAL HIGH (ref 22–32)
Calcium: 9.6 mg/dL (ref 8.9–10.3)
Chloride: 91 mmol/L — ABNORMAL LOW (ref 98–111)
Creatinine, Ser: 0.82 mg/dL (ref 0.61–1.24)
GFR, Estimated: >60 mL/min (ref >60)
Glucose, Bld: 98 mg/dL (ref 70–99)
Potassium: 3.7 mmol/L (ref 3.5–5.1)
Sodium: 135 mmol/L (ref 135–145)

## 2020-10-02 LAB — C-REACTIVE PROTEIN: CRP: 9.4 mg/dL — ABNORMAL HIGH (ref <1.0)

## 2020-10-02 LAB — MAGNESIUM: Magnesium: 1.9 mg/dL (ref 1.7–2.4)

## 2020-10-02 MED ORDER — METOPROLOL TARTRATE 100 MG PO TABS: 50.0000 mg | ORAL_TABLET | Freq: Two times a day (BID) | ORAL | Status: DC

## 2020-10-02 MED ORDER — CEPHALEXIN 500 MG PO CAPS: 500.0000 mg | ORAL_CAPSULE | Freq: Four times a day (QID) | ORAL | 0 refills | Status: AC

## 2020-10-02 MED ORDER — FERROUS SULFATE 325 (65 FE) MG PO TABS: 325.0000 mg | ORAL_TABLET | Freq: Every day | ORAL | 0 refills | Status: DC

## 2020-10-02 MED ORDER — DOXYCYCLINE HYCLATE 100 MG PO TABS: 100.0000 mg | ORAL_TABLET | Freq: Two times a day (BID) | ORAL | 0 refills | Status: AC

## 2020-10-02 NOTE — TOC Transition Note (Addendum)
Transition of Care [facility]) - CM/SW Discharge Note   Patient Details  Name: Renan Danese MRN: [id number] Date of Birth: 1945/12/07  Transition [facility] Care [facility]) CM/SW Contact:  Carles Collet, RN Phone Number: 10/02/2020, 11:46 AM   Clinical Narrative:    SPoke w patient at bedside. He states that he will DC to home. He has DME RW at home. He will need portable O2 tank for ride home. Verified w Alphon[facility] Grieve that it is en route to hospital. Patient will [facility] for unna boots. Referral made to [facility]- unable to accept [facility]- unable to accept [facility]- unable to accept [facility]- able to accept   Final next level of care: [place] Barriers to Discharge: No Barriers Identified   Patient Goals and CMS Choice Patient states their goals for this hospitalization and ongoing recovery are:: to go home CMS Medicare.gov Compare Post Acute Care list provided to:: Patient Choice offered to / list presented to : Patient  Discharge Placement                       Discharge Plan and Services   Discharge Planning Services: CM Consult            DME Arranged: Oxygen DME Agency: [facility] Date DME Agency Contacted: 10/02/20 Time DME Agency Contacted: [phone] Representative spoke with at DME Agency: Caryl Pina [facility] Arranged: RN,PT          Social Determinants of Health (SDOH) Interventions     Readmission Risk Interventions No flowsheet data found.

## 2020-10-02 NOTE — Progress Notes (Addendum)
Physical Therapy Treatment Patient Details Name: Shane Diaz MRN: [id number] DOB: 15-Jan-1946 Today's Date: 10/02/2020    History of Present Illness Pt is a 75 y.o. male who presents with SOB, fever/chills, fall, generalized weakness, and lower extremity edema. Upon arrival to hospital, pt found to be hypotensive. Pt with sepsis likely due to combination of R lower extremity cellulitis and pneumonia likely gram-positive. CT of head negative for acute intracranial pathology. PMH: OSA, HTN, paroxysmal a-fib, chronic diastolic congestive heart failure, and obesity.    PT Comments    Pt making gradual progress.  He does ambulate slowly but was steady with RW and has support at home.  Pt was on 2 L O2 with stable sats during treatment.  Pt with expected d/c later today, declined further therapy after walk. Noted pt already qualified for O2 so did not further test   Follow Up Recommendations  No PT follow up     Equipment Recommendations  Rolling walker with 5" wheels    Recommendations for Other Services       Precautions / Restrictions Precautions Precautions: Fall Precaution Comments: monitor O2    Mobility  Bed Mobility Overal bed mobility: Needs Assistance Bed Mobility: Supine to Sit     Supine to sit: Modified independent (Device/Increase time);HOB elevated          Transfers Overall transfer level: Needs assistance Equipment used: Rolling walker (2 wheeled) Transfers: Sit to/from Stand Sit to Stand: Supervision         General transfer comment: Cues for controlled descent  Ambulation/Gait Ambulation/Gait assistance: Supervision Gait Distance (Feet): 60 Feet Assistive device: Rolling walker (2 wheeled) Gait Pattern/deviations: Step-to pattern;Decreased stride length;Shuffle     General Gait Details: Small steps but stable with RW; cued to use RW at home   Stairs             Wheelchair Mobility    Modified Rankin (Stroke Patients Only)        Balance Overall balance assessment: Needs assistance   Sitting balance-Leahy Scale: Normal     Standing balance support: No upper extremity supported;Bilateral upper extremity supported Standing balance-Leahy Scale: Good Standing balance comment: RW for ambulation but able to static stand and reach without support                            Cognition Arousal/Alertness: Awake/alert Behavior During Therapy: WFL for tasks assessed/performed Overall Cognitive Status: Within Functional Limits for tasks assessed                                 General Comments: A&Ox4. Pt appropriately following commands to maintain safety.      Exercises      General Comments General comments (skin integrity, edema, etc.): On 2 L O2 with sats 100% rest and 96% ambulation      Pertinent Vitals/Pain Pain Assessment: No/denies pain    Home Living Family/patient expects to be discharged to:: Private residence Living Arrangements: Spouse/significant other                  Prior Function            PT Goals (current goals can now be found in the care plan section) Acute Rehab PT Goals Patient Stated Goal: return home today PT Goal Formulation: With patient Time For Goal Achievement: 10/10/20 Potential to Achieve Goals: Good Progress towards PT  goals: Progressing toward goals    Frequency    Min 3X/week      PT Plan Current plan remains appropriate    Co-evaluation              AM-PAC PT "6 Clicks" Mobility   Outcome Measure  Help needed turning from your back to your side while in a flat bed without using bedrails?: None Help needed moving from lying on your back to sitting on the side of a flat bed without using bedrails?: None Help needed moving to and from a bed to a chair (including a wheelchair)?: A Little Help needed standing up from a chair using your arms (e.g., wheelchair or bedside chair)?: A Little Help needed to walk in hospital  room?: A Little Help needed climbing 3-5 steps with a railing? : A Little 6 Click Score: 20    End of Session Equipment Utilized During Treatment: Oxygen Activity Tolerance: Patient tolerated treatment well Patient left: with call bell/phone within reach;in bed;with bed alarm set (sitting EOB) Nurse Communication: Mobility status PT Visit Diagnosis: Unsteadiness on feet (R26.81);History of falling (Z91.81);Muscle weakness (generalized) (M62.81);Other abnormalities of gait and mobility (R26.89);Pain Pain - part of body: Leg     Time: 1030-1050 PT Time Calculation (min) (ACUTE ONLY): 20 min  Charges:  $Gait Training: 8-22 mins                     Abran Richard, PT Acute Rehab Services Pager [phone] [facility] 10/02/2020, 10:58 AM

## 2020-10-02 NOTE — Discharge Summary (Signed)
Physician Discharge Summary  Shane Diaz KWI:[id number] DOB: 09-03-1945 DOA: 09/24/2020  PCP: [name], MD  Admit date: 09/24/2020 Discharge date: 10/02/2020  Admitted From: Home Disposition: Home  Recommendations for Outpatient Follow-up:  1. Follow up with PCP in 1 week with repeat CBC/BMP 2. Outpatient follow-up with cardiology 3. Recommend outpatient follow-up with wound care for chronic lymphedema follow-up 4. Follow up in ED if symptoms worsen or new appear   Home Health: Home health RN Equipment/Devices: Oxygen via nasal cannula at 2 L/min.  Unna boots  Discharge Condition: Stable CODE STATUS: Full Diet recommendation: Heart healthy/fluid restriction of up to 1500 cc a day  Brief/Interim Summary: 75 year old male with history of hypertension, hyperlipidemia, obstructive sleep apnea, chronic hypoxic respiratory failure on 3 L oxygen via nasal cannula at home, paroxysmal A. fib, chronic diastolic CHF, obesity presented with generalized weakness or shortness of breath and fever.  Presentation, he was febrile to 103.1 and initially hypotensive with a blood pressure of 87/49 which improved to 101/58 after fluid resuscitation.  WBCs of 17.4, lactic acid of 2.3 which improved to 2.1 after fluid resuscitation.  COVID-19 and influenza testing were negative.  Chest x-ray showed left lower lobe opacities concerning for pneumonia.  He was started on Rocephin and Zithromax.  Discharge Diagnoses:   Severe sepsis: Present on admission, possibly from below Community-acquired bacterial pneumonia Bilateral lower extremity cellulitis Leukocytosis -Patient presented with generalized weakness and was febrile, hypotensive with leukocytosis and lactic acid of 2.3 which improved with IV fluids and chest x-ray showing possible left lower lobe pneumonia.  He also had bilateral lower extremity cellulitis.  COVID-19 and flu testing were negative -Cultures negative so far.  Sepsis has  resolved -Currently on Rocephin and vancomycin.  Off Zithromax. -WBCs normalized.  Currently afebrile. CRP improving  -Unna boots applied on 10/01/2020 -Patient feels much better.  Cellulitis is improving.  Switch antibiotics to oral Keflex and doxycycline for 5 more days and discharge patient home today.  Acute on chronic hypoxic respiratory failure -Probably from pneumonia and heart failure.  Was requiring up to 6 L nasal cannula oxygen; currently intermittently on 2 L oxygen via nasal cannula.  Patient's oxygen saturations dropped to the 80s on room air on ambulation and currently is requiring 2 L oxygen.  He will be discharged home on the same.  Outpatient follow-up with PCP.  Paroxysmal A. Fib -Currently rate controlled.  Continue amiodarone, Eliquis and metoprolol  Essential hypertension -Blood pressure improved.  Continue metoprolol and oral Lasix  Acute on chronic diastolic heart failure -Negative balance of 9229.6 cc since admission.    Treated with IV Lasix which was subsequently treated to oral Lasix by cardiology.  Cardiology has signed off and recommend outpatient follow-up.  Continue metoprolol  Obesity-outpatient follow-up  Discharge Instructions  Discharge Instructions    Ambulatory referral to Cardiology   Complete by: As directed    Hospital follow-up   Diet - low sodium heart healthy   Complete by: As directed    Fluid restriction of up to 1500 cc a day   Increase activity slowly   Complete by: As directed      Allergies as of 10/02/2020   No Known Allergies     Medication List    STOP taking these medications   atorvastatin 40 MG tablet Commonly known as: Lipitor   pantoprazole 40 MG tablet Commonly known as: Protonix     TAKE these medications   amiodarone 200 MG tablet Commonly known as: PACERONE Take  200 mg by mouth daily.   apixaban 5 MG Tabs tablet Commonly known as: Eliquis Take 1 tablet (5 mg total) by mouth 2 (two) times daily.    cephALEXin 500 MG capsule Commonly known as: KEFLEX Take 1 capsule (500 mg total) by mouth 4 (four) times daily for 5 days.   doxycycline 100 MG tablet Commonly known as: VIBRA-TABS Take 1 tablet (100 mg total) by mouth 2 (two) times daily for 5 days.   ferrous sulfate 325 (65 FE) MG tablet Take 1 tablet (325 mg total) by mouth daily at 6 (six) AM. What changed: when to take this   furosemide 40 MG tablet Commonly known as: LASIX Take 40 mg by mouth daily.   metoprolol tartrate 100 MG tablet Commonly known as: LOPRESSOR Take 0.5 tablets (50 mg total) by mouth 2 (two) times daily. What changed: how much to take   nitroGLYCERIN 0.4 MG SL tablet Commonly known as: NITROSTAT Place 1 tablet (0.4 mg total) under the tongue every 5 (five) minutes x 3 doses as needed for chest pain.   vitamin B-12 1000 MCG tablet Commonly known as: CYANOCOBALAMIN Take 1,000 mcg by mouth daily.            Durable Medical Equipment  (From admission, onward)         Start     Ordered   10/01/20 1458  For home use only DME oxygen  Once       Question Answer Comment  Length of Need 6 Months   Mode or (Route) Nasal cannula   Liters per Minute 2   Frequency Continuous (stationary and portable oxygen unit needed)   Oxygen conserving device Yes   Oxygen delivery system Gas      10/01/20 1458          Follow-up Information    [name], MD. Schedule an appointment as soon as possible for a visit in 1 week(s).   Specialty: Internal Medicine Why: With repeat CBC/BMP Contact information: [address] [place] [address] [phone]        Sanda Klein, MD .   Specialty: Cardiology Contact information: [address] [place] [address] [phone]              No Known Allergies  Consultations:  Cardiology   Procedures/Studies: CT Head Wo Contrast  Result Date: 09/24/2020 CLINICAL DATA:  75 year old male with head trauma. EXAM: CT HEAD  WITHOUT CONTRAST TECHNIQUE: Contiguous axial images were obtained from the base of the skull through the vertex without intravenous contrast. COMPARISON:  None. FINDINGS: Brain: There is mild age-related atrophy and moderate chronic microvascular ischemic changes. There is no acute intracranial hemorrhage. No mass effect or midline shift. No extra-axial fluid collection. Vascular: No hyperdense vessel or unexpected calcification. Skull: Normal. Negative for fracture or focal lesion. Sinuses/Orbits: No acute finding. Other: None IMPRESSION: 1. No acute intracranial pathology. 2. Age-related atrophy and chronic microvascular ischemic changes. Electronically Signed   By: Anner Crete M.D.   On: 09/24/2020 16:10   DG Chest Portable 1 View  Result Date: 09/24/2020 CLINICAL DATA:  Shortness of breath fever and fall EXAM: PORTABLE CHEST 1 VIEW COMPARISON:  Chest radiograph August 01, 2020 FINDINGS: The heart size and mediastinal contours are partially obscured but appears unchanged. Stable to slightly increased left lower lobe opacities. Similar small bilateral pleural effusions. The visualized skeletal structures are unchanged. IMPRESSION: 1. Stable to slightly increased left lower lobe opacities which could reflect atelectasis and/or  infection. 2. Similar small bilateral pleural effusions. Electronically Signed   By: Dahlia Bailiff MD   On: 09/24/2020 14:05   VAS [place] LOWER EXTREMITY VENOUS (DVT)  Result Date: 09/29/2020  Lower Venous DVT Study Indications: Swelling, and Edema.  Comparison Study: previous 06/15/14 Performing Technologist: Abram Sander RVS  Examination Guidelines: A complete evaluation includes B-mode imaging, spectral Doppler, color Doppler, and power Doppler as needed of all accessible portions of each vessel. Bilateral testing is considered an integral part of a complete examination. Limited examinations for reoccurring indications may be performed as noted. The reflux portion of the exam  is performed with the patient in reverse Trendelenburg.  +---------+---------------+---------+-----------+----------+-------------------+ RIGHT    CompressibilityPhasicitySpontaneityPropertiesThrombus Aging      +---------+---------------+---------+-----------+----------+-------------------+ CFV      Full           Yes      Yes                                      +---------+---------------+---------+-----------+----------+-------------------+ SFJ      Full                                                             +---------+---------------+---------+-----------+----------+-------------------+ FV Prox  Full                                                             +---------+---------------+---------+-----------+----------+-------------------+ FV Mid   Full                                                             +---------+---------------+---------+-----------+----------+-------------------+ FV DistalFull                                                             +---------+---------------+---------+-----------+----------+-------------------+ PFV      Full                                                             +---------+---------------+---------+-----------+----------+-------------------+ POP      Full           Yes      Yes                                      +---------+---------------+---------+-----------+----------+-------------------+ PTV      Full                                                             +---------+---------------+---------+-----------+----------+-------------------+  PERO                                                  Not well visualized +---------+---------------+---------+-----------+----------+-------------------+   +---------+---------------+---------+-----------+----------+-------------------+ LEFT     CompressibilityPhasicitySpontaneityPropertiesThrombus Aging       +---------+---------------+---------+-----------+----------+-------------------+ CFV      Full           Yes      Yes                                      +---------+---------------+---------+-----------+----------+-------------------+ SFJ      Full                                                             +---------+---------------+---------+-----------+----------+-------------------+ FV Prox  Full                                                             +---------+---------------+---------+-----------+----------+-------------------+ FV Mid   Full                                                             +---------+---------------+---------+-----------+----------+-------------------+ FV DistalFull                                                             +---------+---------------+---------+-----------+----------+-------------------+ PFV      Full                                                             +---------+---------------+---------+-----------+----------+-------------------+ POP      Full           Yes      Yes                                      +---------+---------------+---------+-----------+----------+-------------------+ PTV                                                   Not well visualized +---------+---------------+---------+-----------+----------+-------------------+ PERO  Not well visualized +---------+---------------+---------+-----------+----------+-------------------+     Summary: BILATERAL: - No evidence of deep vein thrombosis seen in the lower extremities, bilaterally. - No evidence of superficial venous thrombosis in the lower extremities, bilaterally. -No evidence of popliteal cyst, bilaterally.   *See table(s) above for measurements and observations. Electronically signed by Ruta Hinds MD on 09/29/2020 at 10:46:12 AM.    Final        Subjective: Patient  seen and examined at bedside.  He feels much better and thinks that he is ready to go home today.  Still feels slightly short of breath with exertion.  Lower extremity pain is improving.  Discharge Exam: Vitals:   10/02/20 0700 10/02/20 0800  BP: (!) 130/94   Pulse: 87   Resp: 20 20  Temp: 98.3 F (36.8 C)   SpO2: 96%     General: Pt is alert, awake, not in acute distress.  Currently on 2 L oxygen via nasal cannula. Cardiovascular: rate controlled, S1/S2 + Respiratory: bilateral decreased breath sounds at bases with some scattered crackles Abdominal: Soft, obese, NT, ND, bowel sounds + Extremities: Bilateral lower extremity Unna boots present     The results of significant diagnostics from this hospitalization (including imaging, microbiology, ancillary and laboratory) are listed below for reference.     Microbiology: Recent Results (from the past 240 hour(s))  Culture, blood (Routine x 2)     Status: None   Collection Time: 09/24/20  1:20 PM   Specimen: BLOOD RIGHT HAND  Result Value Ref Range Status   Specimen Description BLOOD RIGHT HAND  Final   Special Requests   Final    BOTTLES DRAWN AEROBIC AND ANAEROBIC Blood Culture results may not be optimal due to an excessive volume of blood received in culture bottles   Culture   Final    NO GROWTH 5 DAYS Performed at [facility], [place] [address] [place], [place] [address]    Report Status 09/29/2020 FINAL  Final  Culture, blood (Routine x 2)     Status: None   Collection Time: 09/24/20  2:04 PM   Specimen: BLOOD RIGHT FOREARM  Result Value Ref Range Status   Specimen Description BLOOD RIGHT FOREARM  Final   Special Requests   Final    BOTTLES DRAWN AEROBIC AND ANAEROBIC Blood Culture adequate volume   Culture   Final    NO GROWTH 5 DAYS Performed at [facility], [place] [address] [place], [place] [address]    Report Status 09/29/2020 FINAL  Final  Resp Panel by RT-PCR (Flu A&B, Covid) Nasopharyngeal Swab      Status: None   Collection Time: 09/24/20  3:23 PM   Specimen: Nasopharyngeal Swab; Nasopharyngeal(NP) swabs in vial transport medium  Result Value Ref Range Status   SARS Coronavirus 2 by RT PCR NEGATIVE NEGATIVE Final    Comment: (NOTE) SARS-CoV-2 target nucleic acids are NOT DETECTED.  The SARS-CoV-2 RNA is generally detectable in upper respiratory specimens during the acute phase of infection. The lowest concentration of SARS-CoV-2 viral copies this assay can detect is 138 copies/mL. A negative result does not preclude SARS-Cov-2 infection and should not be used as the sole basis for treatment or other patient management decisions. A negative result may occur with  improper specimen collection/handling, submission of specimen other than nasopharyngeal swab, presence of viral mutation(s) within the areas targeted by this assay, and inadequate number of viral copies(<138 copies/mL). A negative result must be combined with clinical observations, patient  history, and epidemiological information. The expected result is Negative.  Fact Sheet for Patients:  EntrepreneurPulse.com.au  Fact Sheet for Healthcare Providers:  IncredibleEmployment.be  This test is no t yet approved or cleared by the [place] FDA and  has been authorized for detection and/or diagnosis of SARS-CoV-2 by FDA under an Emergency Use Authorization (EUA). This EUA will remain  in effect (meaning this test can be used) for the duration of the COVID-19 declaration under Section 564(b)(1) of the Act, 21 U.S.C.section 360bbb-3(b)(1), unless the authorization is terminated  or revoked sooner.       Influenza A by PCR NEGATIVE NEGATIVE Final   Influenza B by PCR NEGATIVE NEGATIVE Final    Comment: (NOTE) The Xpert Xpress SARS-CoV-2/FLU/RSV plus assay is intended as an aid in the diagnosis of influenza from Nasopharyngeal swab specimens and should not be used as a sole basis  for treatment. Nasal washings and aspirates are unacceptable for Xpert Xpress SARS-CoV-2/FLU/RSV testing.  Fact Sheet for Patients: EntrepreneurPulse.com.au  Fact Sheet for Healthcare Providers: IncredibleEmployment.be  This test is not yet approved or cleared by the [place] FDA and has been authorized for detection and/or diagnosis of SARS-CoV-2 by FDA under an Emergency Use Authorization (EUA). This EUA will remain in effect (meaning this test can be used) for the duration of the COVID-19 declaration under Section 564(b)(1) of the Act, 21 U.S.C. section 360bbb-3(b)(1), unless the authorization is terminated or revoked.  Performed at [facility], [place] [address] [place], [place] [address]      Labs: BNP (last 3 results) Recent Labs    07/07/20 1505 08/01/20 0738 09/27/20 1620  BNP 276.1* 432.5* 315.1*   Basic Metabolic Panel: Recent Labs  Lab 09/28/20 0408 09/29/20 0321 09/30/20 0251 10/01/20 0227 10/02/20 0410  NA 135 134* 137 137 135  K 3.6 3.6 3.7 3.8 3.7  CL 93* 92* 92* 91* 91*  CO2 32 32 32 33* 33*  GLUCOSE 98 97 98 100* 98  BUN 13 12 14 13 12   CREATININE 0.97 0.89 0.92 0.92 0.82  CALCIUM 9.4 9.4 9.5 9.5 9.6  MG 1.7 1.7 1.6* 1.6* 1.9   Liver Function Tests: No results for input(s): AST, ALT, ALKPHOS, BILITOT, PROT, ALBUMIN in the last 168 hours. No results for input(s): LIPASE, AMYLASE in the last 168 hours. No results for input(s): AMMONIA in the last 168 hours. CBC: Recent Labs  Lab 09/28/20 0408 09/29/20 0321 09/30/20 0251 10/01/20 0227 10/02/20 0410  WBC 8.8 8.7 9.2 8.8 7.2  NEUTROABS 5.3 5.2 4.9 4.6 3.6  HGB 11.8* 11.9* 11.5* 11.5* 11.3*  HCT 37.0* 37.7* 36.6* 38.1* 36.7*  MCV 86.7 86.3 87.6 89.9 88.2  PLT 252 235 256 272 278   Cardiac Enzymes: No results for input(s): CKTOTAL, CKMB, CKMBINDEX, TROPONINI in the last 168 hours. BNP: Invalid input(s): POCBNP CBG: No results for input(s):  GLUCAP in the last 168 hours. D-Dimer No results for input(s): DDIMER in the last 72 hours. Hgb A1c No results for input(s): HGBA1C in the last 72 hours. Lipid Profile No results for input(s): CHOL, HDL, LDLCALC, TRIG, CHOLHDL, LDLDIRECT in the last 72 hours. Thyroid function studies No results for input(s): TSH, T4TOTAL, T3FREE, THYROIDAB in the last 72 hours.  Invalid input(s): FREET3 Anemia work up No results for input(s): VITAMINB12, FOLATE, FERRITIN, TIBC, IRON, RETICCTPCT in the last 72 hours. Urinalysis    Component Value Date/Time   COLORURINE YELLOW 09/24/2020 1354   APPEARANCEUR CLEAR 09/24/2020 1354   LABSPEC  1.014 09/24/2020 1354   PHURINE 5.0 09/24/2020 1354   GLUCOSEU NEGATIVE 09/24/2020 1354   HGBUR SMALL (A) 09/24/2020 1354   BILIRUBINUR NEGATIVE 09/24/2020 1354   KETONESUR NEGATIVE 09/24/2020 1354   PROTEINUR NEGATIVE 09/24/2020 1354   NITRITE NEGATIVE 09/24/2020 1354   LEUKOCYTESUR NEGATIVE 09/24/2020 1354   Sepsis Labs Invalid input(s): PROCALCITONIN,  WBC,  LACTICIDVEN Microbiology Recent Results (from the past 240 hour(s))  Culture, blood (Routine x 2)     Status: None   Collection Time: 09/24/20  1:20 PM   Specimen: BLOOD RIGHT HAND  Result Value Ref Range Status   Specimen Description BLOOD RIGHT HAND  Final   Special Requests   Final    BOTTLES DRAWN AEROBIC AND ANAEROBIC Blood Culture results may not be optimal due to an excessive volume of blood received in culture bottles   Culture   Final    NO GROWTH 5 DAYS Performed at [facility], [place] [address] [place], [place] [address]    Report Status 09/29/2020 FINAL  Final  Culture, blood (Routine x 2)     Status: None   Collection Time: 09/24/20  2:04 PM   Specimen: BLOOD RIGHT FOREARM  Result Value Ref Range Status   Specimen Description BLOOD RIGHT FOREARM  Final   Special Requests   Final    BOTTLES DRAWN AEROBIC AND ANAEROBIC Blood Culture adequate volume   Culture   Final    NO  GROWTH 5 DAYS Performed at [facility], [place] [address], [place], [place] [address]    Report Status 09/29/2020 FINAL  Final  Resp Panel by RT-PCR (Flu A&B, Covid) Nasopharyngeal Swab     Status: None   Collection Time: 09/24/20  3:23 PM   Specimen: Nasopharyngeal Swab; Nasopharyngeal(NP) swabs in vial transport medium  Result Value Ref Range Status   SARS Coronavirus 2 by RT PCR NEGATIVE NEGATIVE Final    Comment: (NOTE) SARS-CoV-2 target nucleic acids are NOT DETECTED.  The SARS-CoV-2 RNA is generally detectable in upper respiratory specimens during the acute phase of infection. The lowest concentration of SARS-CoV-2 viral copies this assay can detect is 138 copies/mL. A negative result does not preclude SARS-Cov-2 infection and should not be used as the sole basis for treatment or other patient management decisions. A negative result may occur with  improper specimen collection/handling, submission of specimen other than nasopharyngeal swab, presence of viral mutation(s) within the areas targeted by this assay, and inadequate number of viral copies(<138 copies/mL). A negative result must be combined with clinical observations, patient history, and epidemiological information. The expected result is Negative.  Fact Sheet for Patients:  EntrepreneurPulse.com.au  Fact Sheet for Healthcare Providers:  IncredibleEmployment.be  This test is no t yet approved or cleared by the [place] FDA and  has been authorized for detection and/or diagnosis of SARS-CoV-2 by FDA under an Emergency Use Authorization (EUA). This EUA will remain  in effect (meaning this test can be used) for the duration of the COVID-19 declaration under Section 564(b)(1) of the Act, 21 U.S.C.section 360bbb-3(b)(1), unless the authorization is terminated  or revoked sooner.       Influenza A by PCR NEGATIVE NEGATIVE Final   Influenza B by PCR NEGATIVE NEGATIVE Final     Comment: (NOTE) The Xpert Xpress SARS-CoV-2/FLU/RSV plus assay is intended as an aid in the diagnosis of influenza from Nasopharyngeal swab specimens and should not be used as a sole basis for treatment. Nasal washings and aspirates are  unacceptable for Xpert Xpress SARS-CoV-2/FLU/RSV testing.  Fact Sheet for Patients: EntrepreneurPulse.com.au  Fact Sheet for Healthcare Providers: IncredibleEmployment.be  This test is not yet approved or cleared by the [place] FDA and has been authorized for detection and/or diagnosis of SARS-CoV-2 by FDA under an Emergency Use Authorization (EUA). This EUA will remain in effect (meaning this test can be used) for the duration of the COVID-19 declaration under Section 564(b)(1) of the Act, 21 U.S.C. section 360bbb-3(b)(1), unless the authorization is terminated or revoked.  Performed at [facility], [place] [address] [place], [place] [address]      Time coordinating discharge: 35 minutes  SIGNED:   Aline August, MD  [facility] 10/02/2020, 9:27 AM

## 2020-10-02 NOTE — Plan of Care (Signed)

## 2020-10-23 ENCOUNTER — Ambulatory Visit (INDEPENDENT_AMBULATORY_CARE_PROVIDER_SITE_OTHER): Payer: Medicare Other | Admitting: Cardiovascular Disease

## 2020-10-23 ENCOUNTER — Encounter: Payer: Self-pay | Admitting: Cardiovascular Disease

## 2020-10-23 ENCOUNTER — Other Ambulatory Visit: Payer: Self-pay

## 2020-10-23 VITALS — BP 128/70 | HR 68 | Ht 67.0 in | Wt 286.0 lb

## 2020-10-23 DIAGNOSIS — I5032 Chronic diastolic (congestive) heart failure: Secondary | ICD-10-CM

## 2020-10-23 DIAGNOSIS — I82541 Chronic embolism and thrombosis of right tibial vein: Secondary | ICD-10-CM

## 2020-10-23 DIAGNOSIS — I4819 Other persistent atrial fibrillation: Secondary | ICD-10-CM

## 2020-10-23 DIAGNOSIS — R7303 Prediabetes: Secondary | ICD-10-CM

## 2020-10-23 DIAGNOSIS — G4733 Obstructive sleep apnea (adult) (pediatric): Secondary | ICD-10-CM

## 2020-10-23 MED ORDER — POTASSIUM CHLORIDE ER 20 MEQ PO TBCR: 20.0000 meq | EXTENDED_RELEASE_TABLET | ORAL | 3 refills | Status: DC | PRN

## 2020-10-23 MED ORDER — FUROSEMIDE 40 MG PO TABS: 40.0000 mg | ORAL_TABLET | Freq: Every day | ORAL | 3 refills | Status: DC

## 2020-10-23 MED ORDER — MAGNESIUM OXIDE 400 MG PO CAPS: 400.0000 mg | ORAL_CAPSULE | ORAL | 3 refills | Status: DC | PRN

## 2020-10-23 NOTE — Progress Notes (Signed)
Cardiology new patient note:    Date:  10/30/2020   ID:  Shane Diaz, DOB 11-13-1945, MRN [id number]  PCP:  [name], MD  Cardiologist:  Sanda Klein, MD New Electrophysiologist:  None   Referring MD: [name], MD   No chief complaint on file.   History of Present Illness:    Shane Diaz is a 75 y.o. 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    He had Covid pneumonia requiring hospitalization in October 2021.  He was hospitalized with hypoxemia due to decompensated congestive heart failure and atrial flutter with rapid ventricular response in early December 2021.   On transthoracic echocardiogram LV EF was 50-55%, but the right ventricle was mildly dilated and mildly depressed.  TEE showed LVEF 45-50% and a severely enlarged and depressed right ventricle with moderate tricuspid regurgitation. He underwent TEE guided cardioversion successfully on 07/13/2020 and discharged on beta-blockers and anticoagulants, but returned and was hospitalized again with similar problems on 08/01/2020.  An attempt at cardioversion on 08/02/2020 was not successful. IV Amiodarone was initiated.  Cardioversion was tried again the next day, again unsuccessfully.  The plan was to continue loading with amiodarone and bring back for cardioversion.  He was seen back in clinic in mid January and declined repeat cardioversion.  He was again hospitalized in February for volume overload complicated by cellulitis of the lower extremities, sepsis, atrial fibrillation rapid ventricular response.  He required fluid  resuscitation with worsening edema.  The arrhythmia was managed conservatively and his volume status improved after intravenous diuretics.  At hospital discharge his dry weight was estimated to be 270 pounds.  At that weight, his wife reports that his legs were thin and that he no longer had edema or blisters or skin wounds.  Once the patient returned home, he returned to his old habits of adding salt to his food, eating a lot of processed meats and pickles.  He spends the day sitting in a chair at the computer and has developed gradually worsening lower extremity edema.  His weight has gone up 16 pounds and he has developed a new weeping wound on his right leg and blisters on the back of his left calf.  He has been unable to wear compression stockings and does not like to keep his legs elevated.  He also developed a "lobster red" pruritic rash on his neck and shoulders and lower limbs.  He stopped taking amiodarone 4 days before this appointment and the rash is beginning to improve.  It is no longer as pruritic.  He has been taking Zyrtec.  He is on a relatively short list of medications, the only relatively new 1 is indeed amiodarone.  Thankfully and surprisingly he is in sinus rhythm.  His heart rate is 68 bpm.  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    In 2016, years ago he had a right distal femoral and popliteal DVT, partially resolved by follow-up ultrasound in late 2016 but with chronic occlusion of the right tibioperoneal venous trunk.  About 3 years ago he was having a lot of lower extremity pain related to  large varicose veins and underwent venous ablation in his right lower extremity.  He had laser ablation of the left greater saphenous vein and stab phlebectomies of the left leg in November 2016  Starting in March 2020 he had gradually worsening pain and swelling in the left calf, more so than on the right.  In August of this year he developed severe  swelling and discomfort in his left calf and was diagnosed with a DVT and started on Xarelto.  Switched to Eliquis later because of suspected allergy (I suspect this was actually only normal skin changes related to brawny edema).   He has a follow-up visit and ultrasound scheduled with Dr. Scot Dock on March 11.  He denies angina or dyspnea at rest or with his usual activity (he is quite sedentary).  He has not had focal neurological events, syncope, palpitations or serious bleeding problems.  He canceled his sleep study.  It sounds like he would not use CPAP even if we did confirm the diagnosis.  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  He has been living in [place] for the last 10 years or so.  Past Medical History:  Diagnosis Date  . Acute diastolic heart failure ([place]) 08/03/2020  . Arthritis   . Diverticulosis 05/2015   Mild, noted on colonosocpy  . Fatty liver 05/14/2015   noted on [place] ABD  . History of colon polyps 05/2014   sessile in ascending colon, pedunculated in sigmoid colon, diverticulosis  . Hyperlipidemia    pt denies  . Hypertension    not currently taking medications per MD  . OSA (obstructive sleep apnea) 08/03/2020  . 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 Umbilical hernia   . Varicose veins     Past Surgical History:  Procedure Laterality Date  . APPENDECTOMY     childhood  . BIOPSY  07/11/2020   Procedure: BIOPSY;  Surgeon: Ladene Artist, MD;  [facility];  Service: Endoscopy;;  . CARDIOVERSION N/A 07/13/2020   Procedure: CARDIOVERSION;  Surgeon: Josue Hector, MD;  Location: [facility] ENDOSCOPY;  Service: Cardiovascular;  Laterality: N/A;  . CARDIOVERSION N/A 08/02/2020   Procedure: CARDIOVERSION;  Surgeon: Werner Lean, MD;  Location: [facility] ENDOSCOPY;  Service: Cardiovascular;   Laterality: N/A;  . COLONOSCOPY W/ POLYPECTOMY  05/23/2014  . COLONOSCOPY WITH PROPOFOL N/A 07/11/2020   Procedure: COLONOSCOPY WITH PROPOFOL;  Surgeon: Ladene Artist, MD;  Location[facility] ENDOSCOPY;  Service: Endoscopy;  Laterality: N/A;  . ESOPHAGOGASTRODUODENOSCOPY (EGD) WITH PROPOFOL N/A 07/11/2020   Procedure: ESOPHAGOGASTRODUODENOSCOPY (EGD) WITH PROPOFOL;  Surgeon: Ladene Artist, MD;  Location: [facility] ENDOSCOPY;  Service: Endoscopy;  Laterality: N/A;  . TEE WITHOUT CARDIOVERSION N/A 07/13/2020   Procedure: TRANSESOPHAGEAL ECHOCARDIOGRAM (TEE);  Surgeon: Josue Hector, MD;  Location: [facility] ENDOSCOPY;  Service: Cardiovascular;  Laterality: N/A;  . UMBILICAL HERNIA REPAIR N/A 04/27/2018   Procedure: [place];  Surgeon: Alphonsa Overall, MD;  Location: [facility] ORS;  Service: General;  Laterality: N/A;  . varicose veins    . WRIST SURGERY      Current Medications: Current Meds  Medication Sig  . apixaban (ELIQUIS) 5 MG TABS tablet Take 1 tablet (5 mg total) by mouth 2 (two) times daily.  . ferrous sulfate 325 (65 FE) MG tablet Take 1 tablet (325  mg total) by mouth daily at 6 (six) AM.  . Magnesium Oxide 400 MG CAPS Take 1 capsule (400 mg total) by mouth as needed. ONLY TAKE 400mg  OF MAGNESIUM OXIDE WHEN YOU TAKE 80mg  OF LASIX (FUROSEMIDE)  . metoprolol tartrate (LOPRESSOR) 100 MG tablet Take 0.5 tablets (50 mg total) by mouth 2 (two) times daily.  . nitroGLYCERIN (NITROSTAT) 0.4 MG SL tablet Place 1 tablet (0.4 mg total) under the tongue every 5 (five) minutes x 3 doses as needed for chest pain.  . potassium chloride 20 MEQ TBCR Take 20 mEq by mouth as needed. ONLY TAKE 20 MEQ of POTASSIUM WHEN YOU TAKE 80mg  OF LASIX (FUROSEMIDE)  . vitamin B-12 (CYANOCOBALAMIN) 1000 MCG tablet Take 1,000 mcg by mouth daily.  . [DISCONTINUED] amiodarone (PACERONE) 200 MG tablet Take 200 mg by mouth daily.  . [DISCONTINUED] furosemide (LASIX) 40 MG tablet Take 40 mg by mouth daily.      Allergies:   Amiodarone   Social History   Socioeconomic History  . Marital status: Married    Spouse name: Not on file  . Number of children: 0  . Years of education: Not on file  . Highest education level: Not on file  Occupational History  . Not on file  Tobacco Use  . Smoking status: Never Smoker  . Smokeless tobacco: Never Used  Vaping Use  . Vaping Use: Never used  Substance and Sexual Activity  . Alcohol use: Yes    Alcohol/week: 0.0 standard drinks    Comment: occassional wine, martini  . Drug use: No  . Sexual activity: Not on file  Other Topics Concern  . Not on file  Social History Narrative  . Not on file   Social Determinants of Health   Financial Resource Strain: Not on file  Food Insecurity: Not on file  Transportation Needs: Not on file  Physical Activity: Not on file  Stress: Not on file  Social Connections: Not on file     Family History: The patient's family history is negative for Colon cancer, Esophageal cancer, Pancreatic cancer, Prostate cancer, Rectal cancer, and Stomach cancer.  ROS:   Please see the history of present illness.    All other systems are reviewed and are negative.   EKGs/Labs/Other Studies Reviewed:    The following studies were reviewed today: Extensive review of notes from his 3 hospitalizations in the last 3 months. Echocardiogram 07/11/2020   1. Left ventricular ejection fraction, by estimation, is 50 to 55%. The  left ventricle has low normal function. The left ventricle has no regional  wall motion abnormalities. There is moderate left ventricular hypertrophy.  Left ventricular diastolic  parameters are indeterminate.  2. Right ventricular systolic function is mildly reduced. The right  ventricular size is mildly enlarged. There is mildly elevated pulmonary  artery systolic pressure. The estimated right ventricular systolic  pressure is 20.9 mmHg.  3. Right atrial size was mildly dilated.  4. The mitral  valve is normal in structure. Trivial mitral valve  regurgitation.  5. The aortic valve was not well visualized. Aortic valve regurgitation  is not visualized. No aortic stenosis is present.  6. Aortic dilatation noted. There is mild dilatation of the ascending  aorta, measuring 38 mm.  7. The inferior vena cava is dilated in size with <50% respiratory  variability, suggesting right atrial pressure of 15 mmHg   Transesophageal echocardiogram 07/13/2020  1. TEE with no RAA/LAA thrombus DCC x 1 with 200J converted from atrial  flutter rate 133 bpm to NSR rate 88 bpm On eliquis with no immediate  neurologic sequelae.  2. EF decreased with patient in rapid atrial flutter rate 133. Left  ventricular ejection fraction, by estimation, is 45 to 50%. The left  ventricle has mildly decreased function. There is mild left ventricular  hypertrophy.  3. Right ventricular systolic function is severely reduced. The right  ventricular size is severely enlarged.  4. Left atrial size was moderately dilated. No left atrial/left atrial  appendage thrombus was detected.  5. Right atrial size was severely dilated.  6. The mitral valve is normal in structure. Mild mitral valve  regurgitation.  7. Tricuspid valve regurgitation is moderate.  8. The aortic valve is tricuspid. Aortic valve regurgitation is mild.  Mild aortic valve sclerosis is present, with no evidence of aortic valve  stenosis.   EKG:  EKG is  ordered today.  It shows sinus rhythm with first-degree AV block (PR interval 210 ms), old right bundle branch block, QTC 454 ms  Recent Labs: 05/08/2020: TSH 3.690 09/24/2020: ALT 18 09/27/2020: B Natriuretic Peptide 318.0 10/02/2020: BUN 12; Creatinine, Ser 0.82; Hemoglobin 11.3; Magnesium 1.9; Platelets 278; Potassium 3.7; Sodium 135       Component Value Date/Time   CHOL 103 05/08/2020 0335   TRIG 71 05/08/2020 0335   HDL 27 (L) 05/08/2020 0335   CHOLHDL 3.8 05/08/2020 0335   VLDL  14 05/08/2020 0335   LDLCALC 62 05/08/2020 0335    Physical Exam:    VS:  BP 128/70   Pulse 68   Ht 5\' 7"  (1.702 m)   Wt 286 lb (129.7 kg)   SpO2 95%   BMI 44.79 kg/m     Wt Readings from Last 3 Encounters:  10/23/20 286 lb (129.7 kg)  10/02/20 272 lb 14.9 oz (123.8 kg)  08/22/20 271 lb 9.6 oz (123.2 kg)      General: Alert, oriented x3, no distress, morbidly obese Head: no evidence of trauma, PERRL, EOMI, no exophtalmos or lid lag, no myxedema, no xanthelasma; normal ears, nose and oropharynx Neck: normal jugular venous pulsations and no hepatojugular reflux; brisk carotid pulses without delay and no carotid bruits Chest: clear to auscultation, no signs of consolidation by percussion or palpation, normal fremitus, symmetrical and full respiratory excursions Cardiovascular: normal position and quality of the apical impulse, regular rhythm, normal first and second heart sounds, no murmurs, rubs or gallops Abdomen: no tenderness or distention, no masses by palpation, no abnormal pulsatility or arterial bruits, normal bowel sounds, no hepatosplenomegaly Extremities: Severe bilateral brawny edema and discoloration of the calves and feet, small draining wound on the posterior right calf and a couple of blisters on the posterior left calf, unable to palpate pedal pulses, normal pulses in the upper extremities Neurological: grossly nonfocal Psych: Normal mood and affect    ASSESSMENT:    1. Chronic diastolic CHF (congestive heart failure) (HCC)   2. Persistent atrial fibrillation ([place])   3. 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 Morbid obesity ([place])   5. Prediabetes   6. OSA (obstructive sleep apnea)    PLAN:    In order of problems listed above:  1. CHF: I think that he has mostly right heart failure due to chronic cor pulmonale from untreated obstructive sleep apnea, but with protracted atrial tachyarrhythmia he has developed  some systolic and diastolic left ventricular dysfunction, which is hopefully reversible.  He is clearly hypervolemic  today but has no respiratory difficulties.  He is about 16 pounds above his discharge "dry weight".  This is largely due to his noncompliance with sodium restriction.  Will increase his diuretic dose to 80 mg daily and add a potassium supplement and a magnesium supplement since he complains of muscle cramps from the diuretic.  He likes to have some control over his medical conditions so I recommended a weight-based "sliding scale" dose of furosemide.  He can cut back the furosemide to 40 mg daily when his weight is under 270 pounds.  Discussed sodium restricted diet and daily weight monitoring at length. 2. AFib: Thankfully he is in atrial fibrillation today.  Unfortunately, it is likely that he will return to atrial fibrillation once the effects of amiodarone wear off over the next several weeks.  I think he is correct that his skin rash is most likely from this medication.  He is on beta-blockers and Eliquis.  He has normal renal function and if we can ensure compliance, might be a candidate for dofetilide. 3. 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  Repeated to him that it is critical that he reduce the swelling in his limbs to avoid recurrent problems with skin breakdown, cellulitis, sepsis.  Recurrent skin infection with further worsen the edema due to compromise of the lymphatic system.  Needs to keep his legs elevated throughout the day.  Sitting at the computer desk with legs dangling is very much counterproductive. 4. Obesity: He has been ill and is unwilling to participate in regular physical activity (with his current deconditioning and chronic respiratory problems I do not know that any program of exercise would be successful) needs to reduce calorie intake. 5. Pre-DM: Weight loss would solve this problem.  His most recent  hemoglobin A1c in October was not bad at all at 5.8%.  On the other hand he has a very low HDL cholesterol, only 27 last October. 6. OSA: He never wants to talk about the treatment for sleep apnea.  I have pointed out that ignoring this problem will worsen his heart failure, arrhythmia and will limit his lifespan.    Medication Adjustments/Labs and Tests Ordered: Current medicines are reviewed at length with the patient today.  Concerns regarding medicines are outlined above.  Orders Placed This Encounter  Procedures  . EKG 12-Lead   Meds ordered this encounter  Medications  . potassium chloride 20 MEQ TBCR    Sig: Take 20 mEq by mouth as needed. ONLY TAKE 20 MEQ of POTASSIUM WHEN YOU TAKE 80mg  OF LASIX (FUROSEMIDE)    Dispense:  30 tablet    Refill:  3  . Magnesium Oxide 400 MG CAPS    Sig: Take 1 capsule (400 mg total) by mouth as needed. ONLY TAKE 400mg  OF MAGNESIUM OXIDE WHEN YOU TAKE 80mg  OF LASIX (FUROSEMIDE)    Dispense:  30 capsule    Refill:  3  . furosemide (LASIX) 40 MG tablet    Sig: Take 1 tablet (40 mg total) by mouth daily. IF WEIGHT IS 270lb or LESS TAKE 40mg  LASIX DAILY. IF WEIGHT GREATER THAN 270lb TAKE 80mg  LASIX DAILY.    Dispense:  60 tablet    Refill:  3    Patient Instructions  Medication Instructions:  IF YOUR WEIGHT IS 270 pounds OR LESS- PLEASE TAKE 40mg  OF LASIX DAILY  IF YOUR WEIGHT IS GREATER THAN 270 pounds PLEASE TAKE 80mg  LASIX DAILY  (IF YOU TAKE  80mg  OF LASIX PLEASE TAKE 39meq (1 Tablet) of POTASSIUM AND 400mg  (1 Tablet) OF MAGNESIUM OXIDE)  STOP AMIODARONE   *If you need a refill on your cardiac medications before your next appointment, please call your pharmacy*  Follow-Up: At [facility], you and your health needs are our priority.  As part of our continuing mission to provide you with exceptional heart care, we have created designated Provider Care Teams.  These Care Teams include your primary Cardiologist (physician) and Advanced  Practice Providers (APPs -  Physician Assistants and Nurse Practitioners) who all work together to provide you with the care you need, when you need it.    Your next appointment:   1 month(s)  The format for your next appointment:   In Person  Provider:   Sanda Klein, MD  Other Instructions PLEASE KEEP LEGS ELEVATED       Signed, Sanda Klein, MD  10/30/2020 6:38 PM    [place]

## 2020-10-23 NOTE — Patient Instructions (Addendum)
Medication Instructions:  IF YOUR WEIGHT IS 270 pounds OR LESS- PLEASE TAKE 40mg  OF LASIX DAILY  IF YOUR WEIGHT IS GREATER THAN 270 pounds PLEASE TAKE 80mg  LASIX DAILY  (IF YOU TAKE 80mg  OF LASIX PLEASE TAKE 64meq (1 Tablet) of POTASSIUM AND 400mg  (1 Tablet) OF MAGNESIUM OXIDE)  STOP AMIODARONE   *If you need a refill on your cardiac medications before your next appointment, please call your pharmacy*  Follow-Up: At [facility], you and your health needs are our priority.  As part of our continuing mission to provide you with exceptional heart care, we have created designated Provider Care Teams.  These Care Teams include your primary Cardiologist (physician) and Advanced Practice Providers (APPs -  Physician Assistants and Nurse Practitioners) who all work together to provide you with the care you need, when you need it.    Your next appointment:   1 month(s)  The format for your next appointment:   In Person  Provider:   Sanda Klein, MD  Other Instructions PLEASE [place]

## 2020-11-23 ENCOUNTER — Other Ambulatory Visit: Payer: Self-pay

## 2020-11-23 ENCOUNTER — Encounter: Payer: Self-pay | Admitting: Cardiovascular Disease

## 2020-11-23 ENCOUNTER — Ambulatory Visit (INDEPENDENT_AMBULATORY_CARE_PROVIDER_SITE_OTHER): Payer: Medicare Other | Admitting: Cardiovascular Disease

## 2020-11-23 VITALS — BP 125/78 | HR 70 | Ht 69.0 in | Wt 287.2 lb

## 2020-11-23 DIAGNOSIS — I4819 Other persistent atrial fibrillation: Secondary | ICD-10-CM | POA: Diagnosis not present

## 2020-11-23 DIAGNOSIS — I5032 Chronic diastolic (congestive) heart failure: Secondary | ICD-10-CM

## 2020-11-23 DIAGNOSIS — I872 Venous insufficiency (chronic) (peripheral): Secondary | ICD-10-CM

## 2020-11-23 MED ORDER — METOLAZONE 5 MG PO TABS: 5.0000 mg | ORAL_TABLET | ORAL | 0 refills | Status: DC | PRN

## 2020-11-23 MED ORDER — POTASSIUM CHLORIDE ER 20 MEQ PO TBCR: 20.0000 meq | EXTENDED_RELEASE_TABLET | Freq: Every day | ORAL | 3 refills | Status: DC

## 2020-11-23 NOTE — Patient Instructions (Signed)
Medication Instructions:  Begin metolazone 5mg , take 30 minutes before furosemide. Do not take every day, take as needed per Dr. Sallyanne Kuster.   Begin taking potassium 14mEq daily (1 tablet). On the days you take metolazone, take an extra tablet for a total of 17mEq.  *If you need a refill on your cardiac medications before your next appointment, please call your pharmacy*   Lab Work: [facility]Met to be drawn in 1 week.   If you have labs (blood work) drawn today and your tests are completely normal, you will receive your results only by: Marland Kitchen MyChart Message (if you have MyChart) OR . A paper copy in the mail If you have any lab test that is abnormal or we need to change your treatment, we will call you to review the results.   Testing/Procedures: None ordered.    Follow-Up: At [facility], you and your health needs are our priority.  As part of our continuing mission to provide you with exceptional heart care, we have created designated Provider Care Teams.  These Care Teams include your primary Cardiologist (physician) and Advanced Practice Providers (APPs -  Physician Assistants and Nurse Practitioners) who all work together to provide you with the care you need, when you need it.  We recommend signing up for the patient portal called "MyChart".  Sign up information is provided on this After Visit Summary.  MyChart is used to connect with patients for Virtual Visits (Telemedicine).  Patients are able to view lab/test results, encounter notes, upcoming appointments, etc.  Non-urgent messages can be sent to your provider as well.   To learn more about what you can do with MyChart, go to [email or web address].    Your next appointment:   1 month(s)  The format for your next appointment:   In Person  Provider:   Sanda Klein, MD   Other Instructions  Take daily weights. Weigh in the morning before breakfast with the same scale and same clothing.

## 2020-11-23 NOTE — Progress Notes (Signed)
Cardiology new patient note:    Date:  11/25/2020   ID:  Shane Diaz, DOB Jun 27, 1946, MRN [id number]  PCP:  [name], MD  Cardiologist:  Sanda Klein, MD New Electrophysiologist:  None   Referring MD: [name], MD   Chief Complaint  Patient presents with  . Edema    History of Present Illness:    Shane Diaz is a 75 y.o. 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    In spite of the increased diuretic dose, he has not had really any improvement in his lower extremity edema and his weight is essentially unchanged.  His compliance with sodium restriction remains poor.  His wife has been dutifully tightly wrapping his lower extremities and most of his blisters and nonhealing ulcerations have resolved, but he still has 1 blister on the left ankle area.  He is very sedentary and spends virtually the entire day sitting at the computer.  He is does not keep his legs elevated almost at all during the day.  His wife is very frustrated with his noncompliance.  He had to stop his amiodarone after he developed a widespread pruritic rash.  The rash has resolved.  Thankfully, he remains in sinus rhythm (first-degree AV block, PR 220 ms, old RBBB).  The QTC is really not prolonged (QRS 140 ms, QTC 486 ms).  He had Covid pneumonia requiring hospitalization in October 2021.  He was hospitalized with hypoxemia due to decompensated congestive heart failure and atrial flutter with rapid ventricular response in early December 2021.   On transthoracic echocardiogram LV EF was 50-55%, but the right ventricle  was mildly dilated and mildly depressed.  TEE showed LVEF 45-50% and a severely enlarged and depressed right ventricle with moderate tricuspid regurgitation. He underwent TEE guided cardioversion successfully on 07/13/2020 and discharged on beta-blockers and anticoagulants, but returned and was hospitalized again with similar problems on 08/01/2020.  An attempt at cardioversion on 08/02/2020 was not successful. IV Amiodarone was initiated.  Cardioversion was tried again the next day, again unsuccessfully.  The plan was to continue loading with amiodarone and bring back for cardioversion.  He was seen back in clinic in mid January and declined repeat cardioversion.  He was again hospitalized in February for volume overload complicated by cellulitis of the lower extremities, sepsis, atrial fibrillation rapid ventricular response.  He required fluid resuscitation with worsening edema.  The arrhythmia was managed conservatively and his volume status improved after intravenous diuretics.  At hospital discharge his dry weight was estimated to be 270 pounds.  At that weight, his wife reports that his legs were thin and that he no longer had edema or blisters or skin wounds.  Once the patient returned home, he returned to his old habits of adding salt to his food, eating a lot of processed meats and pickles.  He spends the day sitting in a chair at the computer and has developed gradually worsening lower extremity edema.  His weight has gone up 16 pounds and he has developed a new weeping wound on his right leg and blisters on the back of his left calf.  He has been unable to wear  compression stockings and does not like to keep his legs elevated.  He also developed a "lobster red" pruritic rash on his neck and shoulders and lower limbs.  He stopped taking amiodarone 4 days before this appointment and the rash is beginning to improve.  It is no longer as pruritic.  He has been taking Zyrtec.  He is on a relatively short  list of medications, the only relatively new one is indeed amiodarone   In 2016, years ago he had a right distal femoral and popliteal DVT, partially resolved by follow-up ultrasound in late 2016 but with chronic occlusion of the right tibioperoneal venous trunk.  About 3 years ago he was having a lot of lower extremity pain related to large varicose veins and underwent venous ablation in his right lower extremity.  He had laser ablation of the left greater saphenous vein and stab phlebectomies of the left leg in November 2016  Starting in March 2020 he had gradually worsening pain and swelling in the left calf, more so than on the right.  In August of this year he developed severe swelling and discomfort in his left calf and was diagnosed with a DVT and started on Xarelto.  Switched to Eliquis later because of suspected allergy (I suspect this was actually only normal skin changes related to brawny edema).   He has a follow-up visit and ultrasound scheduled with Dr. Scot Dock on March 11.  He denies angina or dyspnea at rest or with his usual activity (he is quite sedentary).  He has not had focal neurological events, syncope, palpitations or serious bleeding problems.  He canceled his sleep study.  It sounds like he would not use CPAP even if we did confirm the diagnosis.  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  He has been living in [place] for the last 10 years or so.  Past Medical History:  Diagnosis Date  . Acute diastolic heart failure ([place]) 08/03/2020  . Arthritis   . Diverticulosis 05/2015   Mild, noted on colonosocpy  . Fatty liver 05/14/2015   noted on [place] ABD  . History of colon polyps 05/2014   sessile in ascending colon, pedunculated in sigmoid colon, diverticulosis  . Hyperlipidemia    pt denies  . Hypertension    not currently taking  medications per MD  . OSA (obstructive sleep apnea) 08/03/2020  . 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 Umbilical hernia   . Varicose veins     Past Surgical History:  Procedure Laterality Date  . APPENDECTOMY     childhood  . BIOPSY  07/11/2020   Procedure: BIOPSY;  Surgeon: Ladene Artist, MD;  Location: [place];  Service: Endoscopy;;  . CARDIOVERSION N/A 07/13/2020   Procedure: CARDIOVERSION;  Surgeon: Josue Hector, MD;  Location: [facility] ENDOSCOPY;  Service: Cardiovascular;  Laterality: N/A;  . CARDIOVERSION N/A 08/02/2020   Procedure: CARDIOVERSION;  Surgeon: Werner Lean, MD;  Location[facility] ENDOSCOPY;  Service: Cardiovascular;  Laterality: N/A;  . COLONOSCOPY W/ POLYPECTOMY  05/23/2014  . COLONOSCOPY WITH PROPOFOL N/A 07/11/2020   Procedure: COLONOSCOPY WITH PROPOFOL;  Surgeon: Ladene Artist, MD;  Location[facility] ENDOSCOPY;  Service: Endoscopy;  Laterality: N/A;  . ESOPHAGOGASTRODUODENOSCOPY (EGD) WITH PROPOFOL N/A 07/11/2020   Procedure: ESOPHAGOGASTRODUODENOSCOPY (EGD) WITH PROPOFOL;  Surgeon: Ladene Artist,  MD;  Location: [facility] ENDOSCOPY;  Service: Endoscopy;  Laterality: N/A;  . TEE WITHOUT CARDIOVERSION N/A 07/13/2020   Procedure: TRANSESOPHAGEAL ECHOCARDIOGRAM (TEE);  Surgeon: Josue Hector, MD;  Location: [facility] ENDOSCOPY;  Service: Cardiovascular;  Laterality: N/A;  . UMBILICAL HERNIA REPAIR N/A 04/27/2018   Procedure: [place];  Surgeon: Alphonsa Overall, MD;  Location: [facility] ORS;  Service: General;  Laterality: N/A;  . varicose veins    . WRIST SURGERY      Current Medications: Current Meds  Medication Sig  . apixaban (ELIQUIS) 5 MG TABS tablet Take 1 tablet (5 mg total) by mouth 2 (two) times daily.  . furosemide (LASIX) 40 MG tablet Take 1 tablet (40 mg total) by mouth daily. IF WEIGHT IS 270lb or LESS TAKE 40mg  LASIX DAILY. IF WEIGHT GREATER THAN 270lb TAKE 80mg  LASIX DAILY.  . Magnesium Oxide 400 MG CAPS Take 1  capsule (400 mg total) by mouth as needed. ONLY TAKE 400mg  OF MAGNESIUM OXIDE WHEN YOU TAKE 80mg  OF LASIX (FUROSEMIDE)  . metolazone (ZAROXOLYN) 5 MG tablet Take 1 tablet (5 mg total) by mouth as needed. Take 30 minutes before furosemide.  . metoprolol tartrate (LOPRESSOR) 100 MG tablet Take 0.5 tablets (50 mg total) by mouth 2 (two) times daily.  . nitroGLYCERIN (NITROSTAT) 0.4 MG SL tablet Place 1 tablet (0.4 mg total) under the tongue every 5 (five) minutes x 3 doses as needed for chest pain.  . vitamin B-12 (CYANOCOBALAMIN) 1000 MCG tablet Take 1,000 mcg by mouth daily.  . [DISCONTINUED] potassium chloride 20 MEQ TBCR Take 20 mEq by mouth as needed. ONLY TAKE 20 MEQ of POTASSIUM WHEN YOU TAKE 80mg  OF LASIX (FUROSEMIDE)     Allergies:   Amiodarone   Social History   Socioeconomic History  . Marital status: Married    Spouse name: Not on file  . Number of children: 0  . Years of education: Not on file  . Highest education level: Not on file  Occupational History  . Not on file  Tobacco Use  . Smoking status: Never Smoker  . Smokeless tobacco: Never Used  Vaping Use  . Vaping Use: Never used  Substance and Sexual Activity  . Alcohol use: Yes    Alcohol/week: 0.0 standard drinks    Comment: occassional wine, martini  . Drug use: No  . Sexual activity: Not on file  Other Topics Concern  . Not on file  Social History Narrative  . Not on file   Social Determinants of Health   Financial Resource Strain: Not on file  Food Insecurity: Not on file  Transportation Needs: Not on file  Physical Activity: Not on file  Stress: Not on file  Social Connections: Not on file     Family History: The patient's family history is negative for Colon cancer, Esophageal cancer, Pancreatic cancer, Prostate cancer, Rectal cancer, and Stomach cancer.  ROS:   Please see the history of present illness.    All other systems are reviewed and are negative.   EKGs/Labs/Other Studies Reviewed:     The following studies were reviewed today: Extensive review of notes from his 3 hospitalizations in the last 3 months. Echocardiogram 07/11/2020   1. Left ventricular ejection fraction, by estimation, is 50 to 55%. The  left ventricle has low normal function. The left ventricle has no regional  wall motion abnormalities. There is moderate left ventricular hypertrophy.  Left ventricular diastolic  parameters are indeterminate.  2. Right ventricular systolic function  is mildly reduced. The right  ventricular size is mildly enlarged. There is mildly elevated pulmonary  artery systolic pressure. The estimated right ventricular systolic  pressure is [id number] mmHg.  3. Right atrial size was mildly dilated.  4. The mitral valve is normal in structure. Trivial mitral valve  regurgitation.  5. The aortic valve was not well visualized. Aortic valve regurgitation  is not visualized. No aortic stenosis is present.  6. Aortic dilatation noted. There is mild dilatation of the ascending  aorta, measuring 38 mm.  7. The inferior vena cava is dilated in size with <50% respiratory  variability, suggesting right atrial pressure of 15 mmHg   Transesophageal echocardiogram 07/13/2020  1. TEE with no RAA/LAA thrombus DCC x 1 with 200J converted from atrial  flutter rate 133 bpm to NSR rate 88 bpm On eliquis with no immediate  neurologic sequelae.  2. EF decreased with patient in rapid atrial flutter rate 133. Left  ventricular ejection fraction, by estimation, is 45 to 50%. The left  ventricle has mildly decreased function. There is mild left ventricular  hypertrophy.  3. Right ventricular systolic function is severely reduced. The right  ventricular size is severely enlarged.  4. Left atrial size was moderately dilated. No left atrial/left atrial  appendage thrombus was detected.  5. Right atrial size was severely dilated.  6. The mitral valve is normal in structure. Mild mitral valve   regurgitation.  7. Tricuspid valve regurgitation is moderate.  8. The aortic valve is tricuspid. Aortic valve regurgitation is mild.  Mild aortic valve sclerosis is present, with no evidence of aortic valve  stenosis.   EKG:  EKG is  ordered today.  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  Recent Labs: 05/08/2020: TSH 3.690 09/24/2020: ALT 18 09/27/2020: B Natriuretic Peptide 318.0 10/02/2020: BUN 12; Creatinine, Ser 0.82; Hemoglobin 11.3; Magnesium 1.9; Platelets 278; Potassium 3.7; Sodium 135       Component Value Date/Time   CHOL 103 05/08/2020 0335   TRIG 71 05/08/2020 0335   HDL 27 (L) 05/08/2020 0335   CHOLHDL 3.8 05/08/2020 0335   VLDL 14 05/08/2020 0335   LDLCALC 62 05/08/2020 0335    Physical Exam:    VS:  BP 125/78   Pulse 70   Ht 5\' 9"  (1.753 m)   Wt 287 lb 3.2 oz (130.3 kg)   SpO2 91%   BMI 42.41 kg/m     Wt Readings from Last 3 Encounters:  11/23/20 287 lb 3.2 oz (130.3 kg)  10/23/20 286 lb (129.7 kg)  10/02/20 272 lb 14.9 oz (123.8 kg)      General: Alert, oriented x3, no distress, morbidly obese Head: no evidence of trauma, PERRL, EOMI, no exophtalmos or lid lag, no myxedema, no xanthelasma; normal ears, nose and oropharynx Neck: normal jugular venous pulsations and no hepatojugular reflux; brisk carotid pulses without delay and no carotid bruits Chest: clear to auscultation, no signs of consolidation by percussion or palpation, normal fremitus, symmetrical and full respiratory excursions Cardiovascular: normal position and quality of the apical impulse, regular rhythm, normal first and second heart sounds, no murmurs, rubs or gallops Abdomen: no tenderness or distention, no masses by palpation, no abnormal pulsatility or arterial bruits, normal bowel sounds, no hepatosplenomegaly Extremities: Severe bilateral  brawny edema and discoloration of the calves and feet, small draining wound on the  posterior right calf and a couple of blisters on the posterior left calf, unable to palpate pedal pulses, normal pulses in the upper extremities Neurological: grossly nonfocal Psych: Normal mood and affect    ASSESSMENT:    1. Chronic diastolic CHF (congestive heart failure) (HCC)   2. Persistent atrial fibrillation (HCC)   3. Venous insufficiency of both lower extremities   4. Morbid obesity ([place])    PLAN:    In order of problems listed above:  1. CHF: I think that he has mostly right heart failure due to chronic cor pulmonale from untreated obstructive sleep apnea, but with protracted atrial tachyarrhythmia he has developed some systolic and diastolic left ventricular dysfunction, which is hopefully reversible.  He remains clearly hypervolemic, but without any respiratory complaints.  He remains roughly 17 pounds above his discharge "dry weight" of 260 pounds.  Once again, discussed sodium restricted diet and daily weight monitoring at length.  He is a big fan of sauerkraut, pickles, processed meats.  All of these contain quantities of sodium that are way too high for his clinical condition.  He should avoid them strictly.  We will give him a prescription for metolazone 5 mg to take once before tomorrow morning's dose of furosemide.  We will touch base with him again in a couple of days to see how much weight he has lost.  We will then try to come up with a intermittent metolazone dosing regimen to keep the edema at [place]. 2. AFib: Remarkably, he is maintaining sinus rhythm, even though he has been off the amiodarone now for about a month.  Unfortunately, it is likely that he will return to atrial fibrillation once the effects of amiodarone wear off over the next several weeks.  The rash has resolved and he was probably correct when he assumed it was a an amiodarone side effect.  He is on beta-blockers and Eliquis.   He has normal renal function and if we can ensure compliance, might be a candidate for dofetilide. 3. 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  Repeated to him that it is critical that he reduce the swelling in his limbs to avoid recurrent problems with skin breakdown, cellulitis, sepsis.  Recurrent skin infection with further worsen the edema due to compromise of the lymphatic system.  Needs to keep his legs elevated throughout the day.  Sitting at the computer desk with legs dangling is very much counterproductive. 4. Obesity: He has remains unwilling to participate in regular physical activity (with his current deconditioning and chronic respiratory problems I do not know that any program of exercise would be successful).  He really needs to reduce calorie intake. 5. Pre-DM: Weight loss would solve this problem.  His most recent hemoglobin A1c in October was not bad at all at 5.8%.  On the other hand he has a very low HDL cholesterol, only 27 last October. 6. OSA: He never wants to talk about the treatment for sleep apnea.  I have pointed out that ignoring this problem will worsen his heart failure, arrhythmia and will limit his lifespan.    Medication Adjustments/Labs and Tests Ordered: Current medicines are reviewed at length with the patient today.  Concerns regarding medicines are outlined above.  Orders Placed This Encounter  Procedures  . Basic metabolic panel  . EKG 12-Lead   Meds ordered this encounter  Medications  . metolazone (ZAROXOLYN) 5 MG tablet    Sig: Take 1 tablet (  5 mg total) by mouth as needed. Take 30 minutes before furosemide.    Dispense:  20 tablet    Refill:  0  . Potassium Chloride ER 20 MEQ TBCR    Sig: Take 20 mEq by mouth daily. ON DAYS YOU TAKE METOLAZONE TAKE AN EXTRA TABLET (36mEq TOTAL)    Dispense:  35 tablet    Refill:  3    Patient Instructions  Medication Instructions:  Begin metolazone  5mg , take 30 minutes before furosemide. Do not take every day, take as needed per Dr. Sallyanne Kuster.   Begin taking potassium 85mEq daily (1 tablet). On the days you take metolazone, take an extra tablet for a total of 37mEq.  *If you need a refill on your cardiac medications before your next appointment, please call your pharmacy*   Lab Work: BMet to be drawn in 1 week.   If you have labs (blood work) drawn today and your tests are completely normal, you will receive your results only by: Marland Kitchen MyChart Message (if you have MyChart) OR . A paper copy in the mail If you have any lab test that is abnormal or we need to change your treatment, we will call you to review the results.   Testing/Procedures: None ordered.    Follow-Up: At [facility], you and your health needs are our priority.  As part of our continuing mission to provide you with exceptional heart care, we have created designated Provider Care Teams.  These Care Teams include your primary Cardiologist (physician) and Advanced Practice Providers (APPs -  Physician Assistants and Nurse Practitioners) who all work together to provide you with the care you need, when you need it.  We recommend signing up for the patient portal called "MyChart".  Sign up information is provided on this After Visit Summary.  MyChart is used to connect with patients for Virtual Visits (Telemedicine).  Patients are able to view lab/test results, encounter notes, upcoming appointments, etc.  Non-urgent messages can be sent to your provider as well.   To learn more about what you can do with MyChart, go to [email or web address].    Your next appointment:   1 month(s)  The format for your next appointment:   In Person  Provider:   Sanda Klein, MD   Other Instructions  Take daily weights. Weigh in the morning before breakfast with the same scale and same clothing.      Signed, Sanda Klein, MD  11/25/2020 2:12 PM    [facility]

## 2020-11-25 ENCOUNTER — Encounter: Payer: Self-pay | Admitting: Cardiovascular Disease

## 2020-12-14 ENCOUNTER — Encounter: Payer: Self-pay | Admitting: Cardiovascular Disease

## 2020-12-14 ENCOUNTER — Other Ambulatory Visit: Payer: Self-pay

## 2020-12-14 ENCOUNTER — Ambulatory Visit (INDEPENDENT_AMBULATORY_CARE_PROVIDER_SITE_OTHER): Payer: Medicare Other | Admitting: Cardiovascular Disease

## 2020-12-14 VITALS — BP 142/86 | HR 86 | Ht 67.0 in | Wt 301.0 lb

## 2020-12-14 DIAGNOSIS — I82541 Chronic embolism and thrombosis of right tibial vein: Secondary | ICD-10-CM

## 2020-12-14 DIAGNOSIS — R7303 Prediabetes: Secondary | ICD-10-CM

## 2020-12-14 DIAGNOSIS — I5032 Chronic diastolic (congestive) heart failure: Secondary | ICD-10-CM | POA: Diagnosis not present

## 2020-12-14 DIAGNOSIS — I4819 Other persistent atrial fibrillation: Secondary | ICD-10-CM | POA: Diagnosis not present

## 2020-12-14 DIAGNOSIS — G4733 Obstructive sleep apnea (adult) (pediatric): Secondary | ICD-10-CM

## 2020-12-14 DIAGNOSIS — R21 Rash and other nonspecific skin eruption: Secondary | ICD-10-CM

## 2020-12-14 MED ORDER — TORSEMIDE 40 MG PO TABS: 40.0000 mg | ORAL_TABLET | Freq: Every day | ORAL | 5 refills | Status: DC

## 2020-12-14 MED ORDER — MAGNESIUM OXIDE 400 MG PO CAPS: 400.0000 mg | ORAL_CAPSULE | Freq: Every day | ORAL | 3 refills | Status: DC

## 2020-12-14 NOTE — Progress Notes (Signed)
Cardiology new patient note:    Date:  12/14/2020   ID:  Shane Diaz, DOB 06-23-1946, MRN [id number]  PCP:  [name], MD  Cardiologist:  Sanda Klein, MD New Electrophysiologist:  None   Referring MD: [name], MD   No chief complaint on file.   History of Present Illness:    Shane Diaz is a 75 y.o. 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    At his last appointment I recommended the metolazone.  He took this once and did not notice any improvement in his urine output.  A few days ago he developed a pruritic rash mostly on the right side of his neck and upper back.  He stopped taking all of his medications except the Eliquis starting on Tuesday of this week (3 days ago).  He has gained another 15 pounds or so since his last appointment and is now roughly 40 pounds above his expected "dry weight" of 260 pounds.  He is very sedentary and denies exertional dyspnea or any chest discomfort at rest or with activity.  He does not have orthopnea or PND but continues have abdominal distention and leg edema.  He still has a couple of ulcerations on his legs that have not fully healed, although his wife is dutifully wrapping his lower extremities with compression dressings.  The ulcerations are quite superficial.  He has regular rhythm today and has not been aware of any palpitations or atrial fibrillation.  His wife continues to be frustrated by his lack of compliance with leg elevation, alcohol abstinence, but he is doing better by using salt substitute rather than regular salt.  Labs  performed on 11/22/2020 showed a creatinine of 1.21, BUN 17, potassium 4.0 and a proBNP of 721.  His lipid profile was not too bad with a total cholesterol 185, HDL 40, LDL 107, triglycerides 188.  He does not have diabetes mellitus.  He had to stop his amiodarone after he developed a widespread pruritic rash.  The rash has resolved.    He had Covid pneumonia requiring hospitalization in October 2021.  He was hospitalized with hypoxemia due to decompensated congestive heart failure and atrial flutter with rapid ventricular response in early December 2021.   On transthoracic echocardiogram LV EF was 50-55%, but the right ventricle was mildly dilated and mildly depressed.  TEE showed LVEF 45-50% and a severely enlarged and depressed right ventricle with moderate tricuspid regurgitation. He underwent TEE guided cardioversion successfully on 07/13/2020 and discharged on beta-blockers and anticoagulants, but returned and was hospitalized again with similar problems on 08/01/2020.  An attempt at cardioversion on 08/02/2020 was not successful. IV Amiodarone was initiated.  Cardioversion was tried again the next day, again unsuccessfully.  The plan was to continue loading with amiodarone and bring back for cardioversion.  He was seen back in clinic in mid January and declined repeat cardioversion.  He was again hospitalized in February for volume overload complicated by cellulitis of the lower extremities, sepsis, atrial fibrillation rapid ventricular response.  He required fluid resuscitation with worsening edema.  The arrhythmia was managed conservatively and his volume status improved after intravenous diuretics.  At hospital  discharge his dry weight was estimated to be 270 pounds.  At that weight, his wife reports that his legs were thin and that he no longer had edema or blisters or skin wounds.  Once the patient returned home, he returned to his old habits of adding salt to his food, eating a lot of processed  meats and pickles.  He spends the day sitting in a chair at the computer and has developed gradually worsening lower extremity edema.  His weight has gone up 16 pounds and he has developed a new weeping wound on his right leg and blisters on the back of his left calf.  He has been unable to wear compression stockings and does not like to keep his legs elevated.  He also developed a "lobster red" pruritic rash on his neck and shoulders and lower limbs.  He stopped taking amiodarone 4 days before this appointment and the rash is beginning to improve.  It is no longer as pruritic.  He has been taking Zyrtec.  He is on a relatively short list of medications, the only relatively new one is indeed amiodarone   In 2016, years ago he had a right distal femoral and popliteal DVT, partially resolved by follow-up ultrasound in late 2016 but with chronic occlusion of the right tibioperoneal venous trunk.  About 3 years ago he was having a lot of lower extremity pain related to large varicose veins and underwent venous ablation in his right lower extremity.  He had laser ablation of the left greater saphenous vein and stab phlebectomies of the left leg in November 2016  Starting in March 2020 he had gradually worsening pain and swelling in the left calf, more so than on the right.  In August of this year he developed severe swelling and discomfort in his left calf and was diagnosed with a DVT and started on Xarelto.  Switched to Eliquis later because of suspected allergy (I suspect this was actually only normal skin changes related to brawny edema).   He has a follow-up visit and ultrasound scheduled with Dr. Scot Dock on March 11.  He denies angina or dyspnea at rest or with his usual activity (he is quite sedentary).  He has not had focal neurological events, syncope, palpitations or serious bleeding problems.  He canceled his sleep study.  It sounds like he would not use CPAP even if we did confirm the  diagnosis.  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  He has been living in [place] for the last 10 years or so.  Past Medical History:  Diagnosis Date  . Acute diastolic heart failure ([place]) 08/03/2020  . Arthritis   . Diverticulosis 05/2015   Mild, noted on colonosocpy  . Fatty liver 05/14/2015   noted on [place] ABD  . History of colon polyps 05/2014   sessile in ascending colon, pedunculated in sigmoid colon, diverticulosis  . Hyperlipidemia    pt denies  . Hypertension    not currently taking medications per MD  . OSA (obstructive sleep apnea) 08/03/2020  . 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 Umbilical hernia   . Varicose veins     Past Surgical History:  Procedure Laterality Date  . APPENDECTOMY     childhood  . BIOPSY  07/11/2020   Procedure: BIOPSY;  Surgeon: Fuller Plan,  Pricilla Riffle, MD;  Location: [facility] ENDOSCOPY;  Service: Endoscopy;;  . CARDIOVERSION N/A 07/13/2020   Procedure: CARDIOVERSION;  Surgeon: Josue Hector, MD;  Location: [facility] ENDOSCOPY;  Service: Cardiovascular;  Laterality: N/A;  . CARDIOVERSION N/A 08/02/2020   Procedure: CARDIOVERSION;  Surgeon: Werner Lean, MD;  Location: [facility] ENDOSCOPY;  Service: Cardiovascular;  Laterality: N/A;  . COLONOSCOPY W/ POLYPECTOMY  05/23/2014  . COLONOSCOPY WITH PROPOFOL N/A 07/11/2020   Procedure: COLONOSCOPY WITH PROPOFOL;  Surgeon: Ladene Artist, MD;  Location: [facility] ENDOSCOPY;  Service: Endoscopy;  Laterality: N/A;  . ESOPHAGOGASTRODUODENOSCOPY (EGD) WITH PROPOFOL N/A 07/11/2020   Procedure: ESOPHAGOGASTRODUODENOSCOPY (EGD) WITH PROPOFOL;  Surgeon: Ladene Artist, MD;  Location: [facility] ENDOSCOPY;  Service: Endoscopy;  Laterality: N/A;  . TEE WITHOUT CARDIOVERSION N/A 07/13/2020   Procedure: TRANSESOPHAGEAL ECHOCARDIOGRAM (TEE);  Surgeon: Josue Hector, MD;   Location: [facility] ENDOSCOPY;  Service: Cardiovascular;  Laterality: N/A;  . UMBILICAL HERNIA REPAIR N/A 04/27/2018   Procedure: [place];  Surgeon: Alphonsa Overall, MD;  Location: [facility] ORS;  Service: General;  Laterality: N/A;  . varicose veins    . WRIST SURGERY      Current Medications: Current Meds  Medication Sig  . apixaban (ELIQUIS) 5 MG TABS tablet Take 1 tablet (5 mg total) by mouth 2 (two) times daily.  . nitroGLYCERIN (NITROSTAT) 0.4 MG SL tablet Place 1 tablet (0.4 mg total) under the tongue every 5 (five) minutes x 3 doses as needed for chest pain.  Marland Kitchen Potassium Chloride ER 20 MEQ TBCR Take 20 mEq by mouth daily. ON DAYS YOU TAKE METOLAZONE TAKE AN EXTRA TABLET (18mEq TOTAL)  . torsemide 40 MG TABS Take 40 mg by mouth daily.  . [DISCONTINUED] ferrous sulfate 325 (65 FE) MG tablet Take 1 tablet (325 mg total) by mouth daily at 6 (six) AM.  . [DISCONTINUED] furosemide (LASIX) 40 MG tablet Take 1 tablet (40 mg total) by mouth daily. IF WEIGHT IS 270lb or LESS TAKE 40mg  LASIX DAILY. IF WEIGHT GREATER THAN 270lb TAKE 80mg  LASIX DAILY.  . [DISCONTINUED] Magnesium Oxide 400 MG CAPS Take 1 capsule (400 mg total) by mouth as needed. ONLY TAKE 400mg  OF MAGNESIUM OXIDE WHEN YOU TAKE 80mg  OF LASIX (FUROSEMIDE)  . [DISCONTINUED] metolazone (ZAROXOLYN) 5 MG tablet Take 1 tablet (5 mg total) by mouth as needed. Take 30 minutes before furosemide.  . [DISCONTINUED] metoprolol tartrate (LOPRESSOR) 100 MG tablet Take 0.5 tablets (50 mg total) by mouth 2 (two) times daily.  . [DISCONTINUED] vitamin B-12 (CYANOCOBALAMIN) 1000 MCG tablet Take 1,000 mcg by mouth daily.     Allergies:   Amiodarone   Social History   Socioeconomic History  . Marital status: Married    Spouse name: Not on file  . Number of children: 0  . Years of education: Not on file  . Highest education level: Not on file  Occupational History  . Not on file  Tobacco Use  . Smoking status: Never Smoker  .  Smokeless tobacco: Never Used  Vaping Use  . Vaping Use: Never used  Substance and Sexual Activity  . Alcohol use: Yes    Alcohol/week: 0.0 standard drinks    Comment: occassional wine, martini  . Drug use: No  . Sexual activity: Not on file  Other Topics Concern  . Not on file  Social History Narrative  . Not on file   Social Determinants of Health   Financial Resource Strain: Not on file  Food Insecurity: Not  on file  Transportation Needs: Not on file  Physical Activity: Not on file  Stress: Not on file  Social Connections: Not on file     Family History: The patient's family history is negative for Colon cancer, Esophageal cancer, Pancreatic cancer, Prostate cancer, Rectal cancer, and Stomach cancer.  ROS:   Please see the history of present illness.    All other systems are reviewed and are negative.   EKGs/Labs/Other Studies Reviewed:    The following studies were reviewed today: Extensive review of notes from his 3 hospitalizations in the last 3 months. Echocardiogram 07/11/2020   1. Left ventricular ejection fraction, by estimation, is 50 to 55%. The  left ventricle has low normal function. The left ventricle has no regional  wall motion abnormalities. There is moderate left ventricular hypertrophy.  Left ventricular diastolic  parameters are indeterminate.  2. Right ventricular systolic function is mildly reduced. The right  ventricular size is mildly enlarged. There is mildly elevated pulmonary  artery systolic pressure. The estimated right ventricular systolic  pressure is 64.3 mmHg.  3. Right atrial size was mildly dilated.  4. The mitral valve is normal in structure. Trivial mitral valve  regurgitation.  5. The aortic valve was not well visualized. Aortic valve regurgitation  is not visualized. No aortic stenosis is present.  6. Aortic dilatation noted. There is mild dilatation of the ascending  aorta, measuring 38 mm.  7. The inferior vena cava  is dilated in size with <50% respiratory  variability, suggesting right atrial pressure of 15 mmHg   Transesophageal echocardiogram 07/13/2020  1. TEE with no RAA/LAA thrombus DCC x 1 with 200J converted from atrial  flutter rate 133 bpm to NSR rate 88 bpm On eliquis with no immediate  neurologic sequelae.  2. EF decreased with patient in rapid atrial flutter rate 133. Left  ventricular ejection fraction, by estimation, is 45 to 50%. The left  ventricle has mildly decreased function. There is mild left ventricular  hypertrophy.  3. Right ventricular systolic function is severely reduced. The right  ventricular size is severely enlarged.  4. Left atrial size was moderately dilated. No left atrial/left atrial  appendage thrombus was detected.  5. Right atrial size was severely dilated.  6. The mitral valve is normal in structure. Mild mitral valve  regurgitation.  7. Tricuspid valve regurgitation is moderate.  8. The aortic valve is tricuspid. Aortic valve regurgitation is mild.  Mild aortic valve sclerosis is present, with no evidence of aortic valve  stenosis.   EKG:  EKG is  ordered today.  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  Recent Labs: 05/08/2020: TSH 3.690 09/24/2020: ALT 18 09/27/2020: B Natriuretic Peptide 318.0 10/02/2020: BUN 12; Creatinine, Ser 0.82; Hemoglobin 11.3; Magnesium 1.9; Platelets 278; Potassium 3.7; Sodium 135       Component Value Date/Time   CHOL 103 05/08/2020 0335   TRIG 71 05/08/2020 0335   HDL 27 (L) 05/08/2020 0335   CHOLHDL 3.8 05/08/2020 0335   VLDL 14 05/08/2020 0335   LDLCALC 62 05/08/2020 0335    Physical Exam:    VS:  BP (!) 142/86 (BP Location: Left Arm, Patient Position: Sitting, Cuff Size: Large)   Pulse 86   Ht 5\' 7"  (1.702 m)   Wt (!) 301 lb (136.5 kg)   BMI 47.14 kg/m  Wt Readings from Last 3 Encounters:  12/14/20 (!) 301 lb (136.5 kg)  11/23/20 287 lb 3.2 oz (130.3 kg)  10/23/20 286 lb (129.7 kg)     General: Alert, oriented x3, no distress, morbidly obese Head: no evidence of trauma, PERRL, EOMI, no exophtalmos or lid lag, no myxedema, no xanthelasma; normal ears, nose and oropharynx Neck: normal jugular venous pulsations and no hepatojugular reflux; brisk carotid pulses without delay and no carotid bruits Chest: clear to auscultation, no signs of consolidation by percussion or palpation, normal fremitus, symmetrical and full respiratory excursions Cardiovascular: normal position and quality of the apical impulse, regular rhythm, normal first and second heart sounds, no murmurs, rubs or gallops Abdomen: no tenderness or distention, no masses by palpation, no abnormal pulsatility or arterial bruits, normal bowel sounds, no hepatosplenomegaly Extremities: no clubbing, cyanosis or edema; 2+ radial, ulnar and brachial pulses bilaterally; 2+ right femoral, posterior tibial and dorsalis pedis pulses; 2+ left femoral, posterior tibial and dorsalis pedis pulses; no subclavian or femoral bruits Neurological: grossly nonfocal Psych: Normal mood and affect  ASSESSMENT:    1. Chronic diastolic CHF (congestive heart failure) (HCC)   2. Persistent atrial fibrillation ([place])   3. 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 Morbid obesity ([place])   5. Prediabetes   6. OSA (obstructive sleep apnea)   7. Rash of body    PLAN:    In order of problems listed above:  1. CHF: He has even worse hypervolemia than not at his last visit, after stopping his diuretics completely.  Despite this, his dyspnea is no different, confirming the fact that he has mostly right heart failure.  He has chronic cor pulmonale from untreated obstructive sleep apnea, but with protracted atrial tachyarrhythmia he had also developed some systolic and  diastolic left ventricular dysfunction, which appears to be reversible.  After stopping diuretics, he is now 40 pounds above his discharge "dry weight" of 260 pounds.  Metolazone did not help.  Furosemide was not enough.  Has developed a rash, etiology uncertain.  We will try torsemide 40 mg once daily.  Resume potassium and magnesium supplements as well. 2. AFib: Compliant with Eliquis.  Unfortunately, he stopped his beta-blockers abruptly, fortunately this did not lead to any rebound arrhythmia or serious rebound hypertension.  He has normal renal function and if we can ensure compliance, might be a candidate for dofetilide.  We will reintroduce his medicines one by one, starting with a diuretic.  Plan to resume the metoprolol next, to protect from tachycardia with recurrent atrial fibrillation. 3. 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  Repeated to him that it is critical that he reduce the swelling in his limbs to avoid recurrent problems with skin breakdown, cellulitis, sepsis.  Recurrent skin infection with further worsen the edema due to compromise of the lymphatic system.  Needs to keep his legs elevated throughout the day.  Sitting at the computer desk with legs dangling is very much counterproductive. 4. Obesity: He has remains unwilling to participate in regular physical activity (with his current deconditioning and chronic respiratory problems I do not know that any program of exercise would be successful).  He really needs to reduce calorie intake. 5. Pre-DM: His most recent labs actually show improvement in his HDL and triglyceride levels.  Fasting glucose was borderline at 103. 6. OSA: He never wants to talk about the treatment for sleep  apnea.  I have pointed out that ignoring this problem will worsen his heart failure, arrhythmia and will limit his lifespan. 7. Rash: This definitely looks like it could be a drug reaction,.  I wonder  if it still a lasting effect from amiodarone which she took for several weeks.  Would reintroduce medications one by one.    Medication Adjustments/Labs and Tests Ordered: Current medicines are reviewed at length with the patient today.  Concerns regarding medicines are outlined above.  No orders of the defined types were placed in this encounter.  Meds ordered this encounter  Medications  . Magnesium Oxide 400 MG CAPS    Sig: Take 1 capsule (400 mg total) by mouth daily.    Dispense:  90 capsule    Refill:  3  . torsemide 40 MG TABS    Sig: Take 40 mg by mouth daily.    Dispense:  30 tablet    Refill:  5    Patient Instructions  Medication Instructions:  STOP the Furosemide 40 mg once daily  START Torsemide 40 mg once daily  TAKE the Magnesium 400 mg once daily TAKE the Potassium 20 mEq once daily  *If you need a refill on your cardiac medications before your next appointment, please call your pharmacy*   Lab Work: Your provider would like for you to return in 10 days to have the following labs drawn: BMET. You do not need an appointment for the lab. Once in our office lobby there is a podium where you can sign in and ring the doorbell to alert [place] that you are here. The lab is open from 8:00 am to 4:30 pm; closed for lunch from 12:45pm-1:45pm.  If you have labs (blood work) drawn today and your tests are completely normal, you will receive your results only by: Marland Kitchen MyChart Message (if you have MyChart) OR . A paper copy in the mail If you have any lab test that is abnormal or we need to change your treatment, we will call you to review the results.   Testing/Procedures: None ordered   Follow-Up: At [facility], you and your health needs are our priority.  As part of our continuing mission to provide you with exceptional heart care, we have created designated Provider Care Teams.  These Care Teams include your primary Cardiologist (physician) and Advanced Practice  Providers (APPs -  Physician Assistants and Nurse Practitioners) who all work together to provide you with the care you need, when you need it.  We recommend signing up for the patient portal called "MyChart".  Sign up information is provided on this After Visit Summary.  MyChart is used to connect with patients for Virtual Visits (Telemedicine).  Patients are able to view lab/test results, encounter notes, upcoming appointments, etc.  Non-urgent messages can be sent to your provider as well.   To learn more about what you can do with MyChart, go to [email or web address].    Your next appointment:   Keep your appointment with Dr. Sallyanne Kuster on 01/10/21 at 2:40 pm    Signed, Sanda Klein, MD  12/14/2020 1:29 PM    [facility]

## 2020-12-14 NOTE — Patient Instructions (Signed)
Medication Instructions:  STOP the Furosemide 40 mg once daily  START Torsemide 40 mg once daily  TAKE the Magnesium 400 mg once daily TAKE the Potassium 20 mEq once daily  *If you need a refill on your cardiac medications before your next appointment, please call your pharmacy*   Lab Work: Your provider would like for you to return in 10 days to have the following labs drawn: BMET. You do not need an appointment for the lab. Once in our office lobby there is a podium where you can sign in and ring the doorbell to alert [place] that you are here. The lab is open from 8:00 am to 4:30 pm; closed for lunch from 12:45pm-1:45pm.  If you have labs (blood work) drawn today and your tests are completely normal, you will receive your results only by: Marland Kitchen MyChart Message (if you have MyChart) OR . A paper copy in the mail If you have any lab test that is abnormal or we need to change your treatment, we will call you to review the results.   Testing/Procedures: None ordered   Follow-Up: At [facility], you and your health needs are our priority.  As part of our continuing mission to provide you with exceptional heart care, we have created designated Provider Care Teams.  These Care Teams include your primary Cardiologist (physician) and Advanced Practice Providers (APPs -  Physician Assistants and Nurse Practitioners) who all work together to provide you with the care you need, when you need it.  We recommend signing up for the patient portal called "MyChart".  Sign up information is provided on this After Visit Summary.  MyChart is used to connect with patients for Virtual Visits (Telemedicine).  Patients are able to view lab/test results, encounter notes, upcoming appointments, etc.  Non-urgent messages can be sent to your provider as well.   To learn more about what you can do with MyChart, go to [email or web address].    Your next appointment:   Keep your appointment with Dr. Sallyanne Kuster on 01/10/21  at 2:40 pm

## 2021-01-10 ENCOUNTER — Other Ambulatory Visit: Payer: Self-pay

## 2021-01-10 ENCOUNTER — Encounter: Payer: Self-pay | Admitting: Cardiovascular Disease

## 2021-01-10 ENCOUNTER — Ambulatory Visit (INDEPENDENT_AMBULATORY_CARE_PROVIDER_SITE_OTHER): Payer: Medicare Other | Admitting: Cardiovascular Disease

## 2021-01-10 VITALS — BP 126/78 | HR 87 | Ht 67.0 in | Wt 286.0 lb

## 2021-01-10 DIAGNOSIS — I87009 Postthrombotic syndrome without complications of unspecified extremity: Secondary | ICD-10-CM | POA: Diagnosis not present

## 2021-01-10 DIAGNOSIS — I4819 Other persistent atrial fibrillation: Secondary | ICD-10-CM | POA: Diagnosis not present

## 2021-01-10 DIAGNOSIS — I5032 Chronic diastolic (congestive) heart failure: Secondary | ICD-10-CM | POA: Diagnosis not present

## 2021-01-10 DIAGNOSIS — G4733 Obstructive sleep apnea (adult) (pediatric): Secondary | ICD-10-CM

## 2021-01-10 DIAGNOSIS — I2781 Cor pulmonale (chronic): Secondary | ICD-10-CM | POA: Diagnosis not present

## 2021-01-10 DIAGNOSIS — R7303 Prediabetes: Secondary | ICD-10-CM

## 2021-01-10 MED ORDER — TORSEMIDE 20 MG PO TABS: 20.0000 mg | ORAL_TABLET | Freq: Every day | ORAL | 3 refills | Status: DC

## 2021-01-10 NOTE — Patient Instructions (Addendum)
Medication Instructions:  TAKE: Torsemide 40 mg once daily until your weight has reached 260 lbs then decrease the Torsemide to 20 mg once daily  *If you need a refill on your cardiac medications before your next appointment, please call your pharmacy*   Lab Work: BMET today  If you have labs (blood work) drawn today and your tests are completely normal, you will receive your results only by: [place] (if you have MyChart) OR A paper copy in the mail If you have any lab test that is abnormal or we need to change your treatment, we will call you to review the results.   Testing/Procedures: None ordered   Follow-Up: At [facility], you and your health needs are our priority.  As part of our continuing mission to provide you with exceptional heart care, we have created designated Provider Care Teams.  These Care Teams include your primary Cardiologist (physician) and Advanced Practice Providers (APPs -  Physician Assistants and Nurse Practitioners) who all work together to provide you with the care you need, when you need it.  We recommend signing up for the patient portal called "MyChart".  Sign up information is provided on this After Visit Summary.  MyChart is used to connect with patients for Virtual Visits (Telemedicine).  Patients are able to view lab/test results, encounter notes, upcoming appointments, etc.  Non-urgent messages can be sent to your provider as well.   To learn more about what you can do with MyChart, go to [email or web address].    Your next appointment:   3 month(s)  The format for your next appointment:   In Person  Provider:   Sanda Klein, MD

## 2021-01-10 NOTE — Progress Notes (Signed)
Cardiology new patient note:    Date:  01/11/2021   ID:  Shane Diaz, DOB January 09, 1946, MRN [id number]  PCP:  [name], MD  Cardiologist:  Sanda Klein, MD New Electrophysiologist:  None   Referring MD: [name], MD   No chief complaint on file.   History of Present Illness:    Shane Diaz is a 75 y.o. 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    Diuresis has been much more effective with torsemide rather than furosemide and metolazone.  He lost down to a minimum of 280 pounds but has gained back to 287 pounds, this is still a 14 pound improvement.  We have previously estimated that his "dry weight" is around 260 pounds.  The edema in his legs substantially better.  He no longer has any weeping wounds or open ulcerations.  There has been no change in his shortness of breath, (he denies having dyspnea although his wife reports that he huffs and puffs when walking).  Check labs today and his potassium is in normal range.  Although we had prescribed torsemide 40 mg once daily, the patient's wife is confident that the tablets she has at home are 20 mg torsemide tablets and she is only been giving him 1 tablet once daily  Interestingly, the rash that we had previously attributed to his amiodarone and/or metoprolol has come back despite not receiving these medications in weeks.  His wife believes that he may be allergic to eggs.  He continues to remain in normal rhythm and has not had any symptoms to suggest atrial fibrillation since his last appointment.  Consistent compliance with sodium  restricted diet, leg elevation and alcohol abstinence continue to be a point of contention between Shane Diaz and his wife.  He also continues to be reluctant to commit to a sleep study, although I have told him repeatedly that I think the underlying cause for his right heart failure (and the worst factor in his prognosis) remains untreated sleep apnea.  His most recent labs show a borderline hemoglobin A1c of 5.8% with a fairly satisfactory cholesterol profile (very low HDL but also low total and LDL cholesterol).  He had Covid pneumonia requiring hospitalization in October 2021.  He was hospitalized with hypoxemia due to decompensated congestive heart failure and atrial flutter with rapid ventricular response in early December 2021.   On transthoracic echocardiogram LV EF was 50-55%, but the right ventricle was mildly dilated and mildly depressed.  TEE showed LVEF 45-50% and a severely enlarged and depressed right ventricle with moderate tricuspid regurgitation. He underwent TEE guided cardioversion successfully on 07/13/2020 and discharged on beta-blockers and anticoagulants, but returned and was hospitalized again with similar problems on 08/01/2020.  An attempt at cardioversion on 08/02/2020 was not successful. IV Amiodarone was initiated.  Cardioversion was tried again the next day, again unsuccessfully.  The plan was to continue loading with amiodarone and bring back for cardioversion.  He was seen back in clinic in mid January and declined repeat cardioversion.  He was again hospitalized in February for volume overload complicated by cellulitis of the lower extremities, sepsis, atrial fibrillation rapid ventricular response.  He  required fluid resuscitation with worsening edema.  The arrhythmia was managed conservatively and his volume status improved after intravenous diuretics.  At hospital discharge his dry weight was estimated to be 270 pounds.  At that weight, his wife reports that his legs were  thin and that he no longer had edema or blisters or skin wounds.  Once the patient returned home, he returned to his old habits of adding salt to his food, eating a lot of processed meats and pickles.  He spends the day sitting in a chair at the computer and has developed gradually worsening lower extremity edema.  His weight has gone up 16 pounds and he has developed a new weeping wound on his right leg and blisters on the back of his left calf.  He has been unable to wear compression stockings and does not like to keep his legs elevated.  He also developed a "lobster red" pruritic rash on his neck and shoulders and lower limbs.  He stopped taking amiodarone 4 days before this appointment and the rash is beginning to improve.  It is no longer as pruritic.  He has been taking Zyrtec.  He is on a relatively short list of medications, the only relatively new one is indeed amiodarone   In 2016, years ago he had a right distal femoral and popliteal DVT, partially resolved by follow-up ultrasound in late 2016 but with chronic occlusion of the right tibioperoneal venous trunk.  About 3 years ago he was having a lot of lower extremity pain related to large varicose veins and underwent venous ablation in his right lower extremity.  He had laser ablation of the left greater saphenous vein and stab phlebectomies of the left leg in November 2016  Starting in March 2020 he had gradually worsening pain and swelling in the left calf, more so than on the right.  In August of this year he developed severe swelling and discomfort in his left calf and was diagnosed with a DVT and started on Xarelto.  Switched to Eliquis later because of suspected allergy (I suspect this was actually only normal skin changes related to brawny edema).   He has a follow-up visit and ultrasound scheduled with Dr. Scot Dock on March 11.  He denies angina or dyspnea at rest or with his usual activity (he is quite sedentary).  He has not had focal  neurological events, syncope, palpitations or serious bleeding problems.  He canceled his sleep study.  It sounds like he would not use CPAP even if we did confirm the diagnosis.  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  He has been living in [place] for the last 10 years or so.  Past Medical History:  Diagnosis Date   Acute diastolic heart failure ([place]) 08/03/2020   Arthritis    Diverticulosis 05/2015   Mild, noted on colonosocpy   Fatty liver 05/14/2015   noted on [place] ABD   History of colon polyps 05/2014   sessile in ascending colon, pedunculated in sigmoid colon, diverticulosis   Hyperlipidemia    pt denies   Hypertension    not currently taking medications per MD   OSA (obstructive sleep apnea) 08/03/2020   Renal cyst 05/2014   Small, Left, noted on [place] ABD   Umbilical hernia    Varicose veins     Past Surgical History:  Procedure Laterality Date   APPENDECTOMY     childhood   BIOPSY  07/11/2020   Procedure: BIOPSY;  Surgeon: Ladene Artist, MD;  Location: [place];  Service: Endoscopy;;   CARDIOVERSION N/A 07/13/2020   Procedure: CARDIOVERSION;  Surgeon: Josue Hector, MD;  Location: [facility] ENDOSCOPY;  Service: Cardiovascular;  Laterality: N/A;   CARDIOVERSION N/A 08/02/2020   Procedure: CARDIOVERSION;  Surgeon: Werner Lean, MD;  Location: [facility] ENDOSCOPY;  Service: Cardiovascular;  Laterality: N/A;   COLONOSCOPY W/ POLYPECTOMY  05/23/2014   COLONOSCOPY WITH PROPOFOL N/A 07/11/2020   Procedure: COLONOSCOPY WITH PROPOFOL;  Surgeon: Ladene Artist, MD;  Location: [place];  Service: Endoscopy;  Laterality: N/A;   ESOPHAGOGASTRODUODENOSCOPY (EGD) WITH PROPOFOL N/A 07/11/2020   Procedure: ESOPHAGOGASTRODUODENOSCOPY (EGD) WITH PROPOFOL;  Surgeon: Ladene Artist, MD;  Location: [facility] ENDOSCOPY;  Service: Endoscopy;   Laterality: N/A;   TEE WITHOUT CARDIOVERSION N/A 07/13/2020   Procedure: TRANSESOPHAGEAL ECHOCARDIOGRAM (TEE);  Surgeon: Josue Hector, MD;  Location: [facility] ENDOSCOPY;  Service: Cardiovascular;  Laterality: N/A;   UMBILICAL HERNIA REPAIR N/A 04/27/2018   Procedure: REPAIR UMBILICAL HERNIA ERAS PATHWAY;  Surgeon: Alphonsa Overall, MD;  Location: [facility] ORS;  Service: General;  Laterality: N/A;   varicose veins     WRIST SURGERY      Current Medications: Current Meds  Medication Sig   apixaban (ELIQUIS) 5 MG TABS tablet Take 1 tablet (5 mg total) by mouth 2 (two) times daily.   Magnesium Oxide 400 MG CAPS Take 1 capsule (400 mg total) by mouth daily.   nitroGLYCERIN (NITROSTAT) 0.4 MG SL tablet Place 1 tablet (0.4 mg total) under the tongue every 5 (five) minutes x 3 doses as needed for chest pain.   Potassium Chloride ER 20 MEQ TBCR Take 20 mEq by mouth daily. ON DAYS YOU TAKE METOLAZONE TAKE AN EXTRA TABLET (52mEq TOTAL)   torsemide (DEMADEX) 20 MG tablet Take 1 tablet (20 mg total) by mouth daily.   [DISCONTINUED] torsemide 40 MG TABS Take 40 mg by mouth daily.     Allergies:   Amiodarone   Social History   Socioeconomic History   Marital status: Married    Spouse name: Not on file   Number of children: 0   Years of education: Not on file   Highest education level: Not on file  Occupational History   Not on file  Tobacco Use   Smoking status: Never   Smokeless tobacco: Never  Vaping Use   Vaping Use: Never used  Substance and Sexual Activity   Alcohol use: Yes    Alcohol/week: 0.0 standard drinks    Comment: occassional wine, martini   Drug use: No   Sexual activity: Not on file  Other Topics Concern   Not on file  Social History Narrative   Not on file   Social Determinants of Health   Financial Resource Strain: Not on file  Food Insecurity: Not on file  Transportation Needs: Not on file  Physical Activity: Not on file  Stress: Not on file  Social Connections: Not on  file     Family History: The patient's family history is negative for Colon cancer, Esophageal cancer, Pancreatic cancer, Prostate cancer, Rectal cancer, and Stomach cancer.  ROS:   Please see the history of present illness.    All other systems are reviewed and are negative.   EKGs/Labs/Other Studies Reviewed:    The following studies were reviewed today: Extensive review of notes from his 3 hospitalizations  in the last 3 months. Echocardiogram 07/11/2020    1. Left ventricular ejection fraction, by estimation, is 50 to 55%. The  left ventricle has low normal function. The left ventricle has no regional  wall motion abnormalities. There is moderate left ventricular hypertrophy.  Left ventricular diastolic  parameters are indeterminate.   2. Right ventricular systolic function is mildly reduced. The right  ventricular size is mildly enlarged. There is mildly elevated pulmonary  artery systolic pressure. The estimated right ventricular systolic  pressure is 42.3 mmHg.   3. Right atrial size was mildly dilated.   4. The mitral valve is normal in structure. Trivial mitral valve  regurgitation.   5. The aortic valve was not well visualized. Aortic valve regurgitation  is not visualized. No aortic stenosis is present.   6. Aortic dilatation noted. There is mild dilatation of the ascending  aorta, measuring 38 mm.   7. The inferior vena cava is dilated in size with <50% respiratory  variability, suggesting right atrial pressure of 15 mmHg   Transesophageal echocardiogram 07/13/2020   1. TEE with no RAA/LAA thrombus DCC x 1 with 200J converted from atrial  flutter rate 133 bpm to NSR rate 88 bpm On eliquis with no immediate  neurologic sequelae.   2. EF decreased with patient in rapid atrial flutter rate 133. Left  ventricular ejection fraction, by estimation, is 45 to 50%. The left  ventricle has mildly decreased function. There is mild left ventricular  hypertrophy.   3. Right  ventricular systolic function is severely reduced. The right  ventricular size is severely enlarged.   4. Left atrial size was moderately dilated. No left atrial/left atrial  appendage thrombus was detected.   5. Right atrial size was severely dilated.   6. The mitral valve is normal in structure. Mild mitral valve  regurgitation.   7. Tricuspid valve regurgitation is moderate.   8. The aortic valve is tricuspid. Aortic valve regurgitation is mild.  Mild aortic valve sclerosis is present, with no evidence of aortic valve  stenosis.   EKG:  EKG is  ordered today.  Very similar to previous tracings show sinus rhythm with right bundle branch block and slightly prolonged QTC at 469 ms  Recent Labs: 05/08/2020: TSH 3.690 09/24/2020: ALT 18 09/27/2020: B Natriuretic Peptide 318.0 10/02/2020: Hemoglobin 11.3; Magnesium 1.9; Platelets 278 01/10/2021: BUN 18; Creatinine, Ser 1.25; Potassium 4.5; Sodium 142       Component Value Date/Time   CHOL 103 05/08/2020 0335   TRIG 71 05/08/2020 0335   HDL 27 (L) 05/08/2020 0335   CHOLHDL 3.8 05/08/2020 0335   VLDL 14 05/08/2020 0335   LDLCALC 62 05/08/2020 0335    Physical Exam:    VS:  BP 126/78   Pulse 87   Ht 5\' 7"  (1.702 m)   Wt 286 lb (129.7 kg)   SpO2 90%   BMI 44.79 kg/m     Wt Readings from Last 3 Encounters:  01/10/21 286 lb (129.7 kg)  12/14/20 (!) 301 lb (136.5 kg)  11/23/20 287 lb 3.2 oz (130.3 kg)      General: Alert, oriented x3, no distress, morbidly obese Head: no evidence of trauma, PERRL, EOMI, no exophtalmos or lid lag, no myxedema, no xanthelasma; normal ears, nose and oropharynx Neck: normal jugular venous pulsations and no hepatojugular reflux; brisk carotid pulses without delay and no carotid bruits Chest: clear to auscultation, no signs of consolidation by percussion or palpation, normal fremitus, symmetrical and full respiratory excursions  Cardiovascular: normal position and quality of the apical impulse, regular  rhythm, normal first and widely split second heart sounds, no murmurs, rubs or gallops Abdomen: no tenderness or distention, no masses by palpation, no abnormal pulsatility or arterial bruits, normal bowel sounds, no hepatosplenomegaly Extremities: no clubbing, cyanosis; his legs are wrapped in tight bandages up to the knee so is hard to assess the edema, but at least he no longer has edema in his thighs 2+ radial, ulnar and brachial pulses bilaterally; 2+ right femoral, posterior tibial and dorsalis pedis pulses; 2+ left femoral, posterior tibial and dorsalis pedis pulses; no subclavian or femoral bruits Neurological: grossly nonfocal Psych: Normal mood and affect   ASSESSMENT:    1. Chronic diastolic CHF (congestive heart failure) ([place])   2. Cor pulmonale, chronic (H[facility])   3. Persistent atrial fibrillation ([place])   4. Postphlebitic syndrome   5. Morbid obesity ([place])   6. Prediabetes   7. OSA (obstructive sleep apnea)     PLAN:    In order of problems listed above:  CHF: He has almost exclusively if not entirely right heart failure due to cor pulmonale.  He may have had some left heart failure due to his prolonged episode of atrial fibrillation, but this may have reversed by now.  Substantial fluid removal with diuretics since his last appointment, but still probably 27 pounds above "dry weight".  Did much better with torsemide compared with furosemide.   Metolazone did not help.  Furosemide was not enough.  Has developed a rash, etiology uncertain.  We will try increasing the torsemide to 40 mg once daily.  Ideally we will get his weight down to 260 pounds within about a months time. AFib: Compliant with Eliquis, currently maintaining normal rhythm without antiarrhythmics and even without beta-blockers.  Amiodarone was thought to be the cause of his rash, but the rash resolved and recurred and now persists long after the medication was stopped.  If atrial fibrillation becomes a frequent  problem, might want to try dofetilide before we commit to amiodarone again. 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  Repeated to him that it is critical that he reduce the swelling in his limbs to avoid recurrent problems with skin breakdown, cellulitis, sepsis.  Recurrent skin infection with further worsen the edema due to compromise of the lymphatic system.  Needs to keep his legs elevated throughout the day.  Sitting at the computer desk with legs dangling is very much counterproductive. Obesity: He has remains unwilling to participate in regular physical activity (with his current deconditioning and chronic respiratory problems I do not know that any program of exercise would be successful).  He really needs to reduce calorie intake. Pre-DM: Borderline glucose levels, generally favorable lipid profile except for very low HDL cholesterol. OSA: Again pointed out that it is critical for him to have a sleep study and then to receive appropriate treatment for sleep apnea. Rash: Etiology uncertain.    Medication Adjustments/Labs and Tests Ordered: Current medicines are reviewed at length with the patient today.  Concerns regarding medicines are outlined above.  Orders Placed This Encounter  Procedures   EKG 12-Lead    Meds ordered this encounter  Medications   torsemide (DEMADEX) 20 MG tablet    Sig: Take 1 tablet (20 mg total) by mouth daily.    Dispense:  90 tablet    Refill:  3     Patient Instructions  Medication Instructions:  TAKE:  Torsemide 40 mg once daily until your weight has reached 260 lbs then decrease the Torsemide to 20 mg once daily  *If you need a refill on your cardiac medications before your next appointment, please call your pharmacy*   Lab Work: BMET today  If you have labs (blood work) drawn today and your tests are completely normal, you will receive your results only by: [place] (if you have  MyChart) OR A paper copy in the mail If you have any lab test that is abnormal or we need to change your treatment, we will call you to review the results.   Testing/Procedures: None ordered   Follow-Up: At [facility], you and your health needs are our priority.  As part of our continuing mission to provide you with exceptional heart care, we have created designated Provider Care Teams.  These Care Teams include your primary Cardiologist (physician) and Advanced Practice Providers (APPs -  Physician Assistants and Nurse Practitioners) who all work together to provide you with the care you need, when you need it.  We recommend signing up for the patient portal called "MyChart".  Sign up information is provided on this After Visit Summary.  MyChart is used to connect with patients for Virtual Visits (Telemedicine).  Patients are able to view lab/test results, encounter notes, upcoming appointments, etc.  Non-urgent messages can be sent to your provider as well.   To learn more about what you can do with MyChart, go to [email or web address].    Your next appointment:   3 month(s)  The format for your next appointment:   In Person  Provider:   Sanda Klein, MD    Signed, Sanda Klein, MD  01/11/2021 2:22 PM    [place]

## 2021-01-11 ENCOUNTER — Encounter: Payer: Self-pay | Admitting: *Deleted

## 2021-01-11 LAB — BASIC METABOLIC PANEL
BUN/Creatinine Ratio: 14 (ref 10–24)
BUN: 18 mg/dL (ref 8–27)
CO2: 28 mmol/L (ref 20–29)
Calcium: 10.5 mg/dL — ABNORMAL HIGH (ref 8.6–10.2)
Chloride: 99 mmol/L (ref 96–106)
Creatinine, Ser: 1.25 mg/dL (ref 0.76–1.27)
Glucose: 100 mg/dL — ABNORMAL HIGH (ref 65–99)
Potassium: 4.5 mmol/L (ref 3.5–5.2)
Sodium: 142 mmol/L (ref 134–144)
eGFR: 60 mL/min/{1.73_m2} (ref >59)

## 2021-01-21 ENCOUNTER — Other Ambulatory Visit: Payer: Self-pay

## 2021-01-21 MED ORDER — ELIQUIS 5 MG PO TABS: 5.0000 mg | ORAL_TABLET | Freq: Two times a day (BID) | ORAL | 1 refills | Status: DC

## 2021-01-21 NOTE — Telephone Encounter (Signed)
69M, 129.7KG, SCR 1.25 01/10/21, LOVW/[name] 01/10/21

## 2021-03-18 ENCOUNTER — Emergency Department (HOSPITAL_COMMUNITY): Payer: Medicare Other

## 2021-03-18 ENCOUNTER — Observation Stay (HOSPITAL_COMMUNITY)
Admission: EM | Admit: 2021-03-18 | Discharge: 2021-03-19 | Disposition: A | Discharge status: Home / Self Care | Payer: Medicare Other | Attending: Internal Medicine | Admitting: Internal Medicine

## 2021-03-18 ENCOUNTER — Other Ambulatory Visit: Payer: Self-pay

## 2021-03-18 DIAGNOSIS — I4891 Unspecified atrial fibrillation: Principal | ICD-10-CM | POA: Insufficient documentation

## 2021-03-18 DIAGNOSIS — G4733 Obstructive sleep apnea (adult) (pediatric): Secondary | ICD-10-CM | POA: Diagnosis present

## 2021-03-18 DIAGNOSIS — R7989 Other specified abnormal findings of blood chemistry: Secondary | ICD-10-CM | POA: Diagnosis present

## 2021-03-18 DIAGNOSIS — R778 Other specified abnormalities of plasma proteins: Secondary | ICD-10-CM | POA: Diagnosis present

## 2021-03-18 DIAGNOSIS — I10 Essential (primary) hypertension: Secondary | ICD-10-CM | POA: Diagnosis present

## 2021-03-18 DIAGNOSIS — I509 Heart failure, unspecified: Secondary | ICD-10-CM | POA: Insufficient documentation

## 2021-03-18 DIAGNOSIS — Z79899 Other long term (current) drug therapy: Secondary | ICD-10-CM | POA: Diagnosis not present

## 2021-03-18 DIAGNOSIS — I110 Hypertensive heart disease with heart failure: Secondary | ICD-10-CM | POA: Insufficient documentation

## 2021-03-18 DIAGNOSIS — I4892 Unspecified atrial flutter: Secondary | ICD-10-CM | POA: Diagnosis not present

## 2021-03-18 DIAGNOSIS — I82403 Acute embolism and thrombosis of unspecified deep veins of lower extremity, bilateral: Secondary | ICD-10-CM | POA: Diagnosis present

## 2021-03-18 DIAGNOSIS — Z20822 Contact with and (suspected) exposure to covid-19: Secondary | ICD-10-CM | POA: Insufficient documentation

## 2021-03-18 DIAGNOSIS — I5032 Chronic diastolic (congestive) heart failure: Secondary | ICD-10-CM

## 2021-03-18 DIAGNOSIS — R000 Tachycardia, unspecified: Secondary | ICD-10-CM | POA: Diagnosis present

## 2021-03-18 DIAGNOSIS — R9431 Abnormal electrocardiogram [ECG] [EKG]: Secondary | ICD-10-CM

## 2021-03-18 LAB — TROPONIN I (HIGH SENSITIVITY)
Troponin I (High Sensitivity): 28 ng/L — ABNORMAL HIGH (ref <18)
Troponin I (High Sensitivity): 28 ng/L — ABNORMAL HIGH (ref <18)
Troponin I (High Sensitivity): 28 ng/L — ABNORMAL HIGH (ref <18)

## 2021-03-18 LAB — CBC WITH DIFFERENTIAL/PLATELET
Abs Immature Granulocytes: 0.05 10*3/uL (ref 0.00–0.07)
Basophils Absolute: 0.1 10*3/uL (ref 0.0–0.1)
Basophils Relative: 1 %
Eosinophils Absolute: 0.4 10*3/uL (ref 0.0–0.5)
Eosinophils Relative: 5 %
HCT: 49.5 % (ref 39.0–52.0)
Hemoglobin: 15.3 g/dL (ref 13.0–17.0)
Immature Granulocytes: 1 %
Lymphocytes Relative: 31 %
Lymphs Abs: 2.6 10*3/uL (ref 0.7–4.0)
MCH: 29.9 pg (ref 26.0–34.0)
MCHC: 30.9 g/dL (ref 30.0–36.0)
MCV: 96.9 fL (ref 80.0–100.0)
Monocytes Absolute: 0.7 10*3/uL (ref 0.1–1.0)
Monocytes Relative: 9 %
Neutro Abs: 4.5 10*3/uL (ref 1.7–7.7)
Neutrophils Relative %: 53 %
Platelets: 199 10*3/uL (ref 150–400)
RBC: 5.11 MIL/uL (ref 4.22–5.81)
RDW: 14.2 % (ref 11.5–15.5)
WBC: 8.3 10*3/uL (ref 4.0–10.5)
nRBC: 0 % (ref 0.0–0.2)

## 2021-03-18 LAB — BASIC METABOLIC PANEL
Anion gap: 9 (ref 5–15)
BUN: 17 mg/dL (ref 8–23)
CO2: 32 mmol/L (ref 22–32)
Calcium: 9.5 mg/dL (ref 8.9–10.3)
Chloride: 98 mmol/L (ref 98–111)
Creatinine, Ser: 1.17 mg/dL (ref 0.61–1.24)
GFR, Estimated: >60 mL/min (ref >60)
Glucose, Bld: 140 mg/dL — ABNORMAL HIGH (ref 70–99)
Potassium: 4.3 mmol/L (ref 3.5–5.1)
Sodium: 139 mmol/L (ref 135–145)

## 2021-03-18 LAB — RESP PANEL BY RT-PCR (FLU A&B, COVID) ARPGX2
Influenza A by PCR: NEGATIVE
Influenza B by PCR: NEGATIVE
SARS Coronavirus 2 by RT PCR: NEGATIVE

## 2021-03-18 LAB — TSH: TSH: 6.379 u[IU]/mL — ABNORMAL HIGH (ref 0.350–4.500)

## 2021-03-18 LAB — BRAIN NATRIURETIC PEPTIDE: B Natriuretic Peptide: 404.6 pg/mL — ABNORMAL HIGH (ref 0.0–100.0)

## 2021-03-18 MED ORDER — SODIUM CHLORIDE 0.9 % IV SOLN: 250.0000 mL | INTRAVENOUS | Status: DC | PRN

## 2021-03-18 MED ORDER — DILTIAZEM HCL-DEXTROSE 125-5 MG/125ML-% IV SOLN (PREMIX)
5.0000 mg/h | INTRAVENOUS | Status: DC
  Administered 2021-03-18: 15 mg/h via INTRAVENOUS
  Administered 2021-03-18: 10 mg/h via INTRAVENOUS
  Administered 2021-03-18: 15 mg/h via INTRAVENOUS
  Administered 2021-03-18: 5 mg/h via INTRAVENOUS
  Filled 2021-03-18 (×2): qty 125

## 2021-03-18 MED ORDER — SENNOSIDES-DOCUSATE SODIUM 8.6-50 MG PO TABS: 1.0000 | ORAL_TABLET | Freq: Every evening | ORAL | Status: DC | PRN

## 2021-03-18 MED ORDER — SODIUM CHLORIDE 0.9% FLUSH
3.0000 mL | Freq: Two times a day (BID) | INTRAVENOUS | Status: DC
  Administered 2021-03-18: 3 mL via INTRAVENOUS

## 2021-03-18 MED ORDER — APIXABAN 5 MG PO TABS
5.0000 mg | ORAL_TABLET | Freq: Two times a day (BID) | ORAL | Status: DC
  Administered 2021-03-18 – 2021-03-19 (×2): 5 mg via ORAL
  Filled 2021-03-18 (×2): qty 1

## 2021-03-18 MED ORDER — IOHEXOL 350 MG/ML SOLN
100.0000 mL | Freq: Once | INTRAVENOUS | Status: AC | PRN
  Administered 2021-03-18: 100 mL via INTRAVENOUS

## 2021-03-18 MED ORDER — LEVOTHYROXINE SODIUM 25 MCG PO TABS
25.0000 ug | ORAL_TABLET | Freq: Every day | ORAL | Status: DC
  Filled 2021-03-18: qty 1

## 2021-03-18 MED ORDER — ACETAMINOPHEN 650 MG RE SUPP: 650.0000 mg | Freq: Four times a day (QID) | RECTAL | Status: DC | PRN

## 2021-03-18 MED ORDER — DILTIAZEM LOAD VIA INFUSION
20.0000 mg | Freq: Once | INTRAVENOUS | Status: AC
  Administered 2021-03-18: 20 mg via INTRAVENOUS
  Filled 2021-03-18: qty 20

## 2021-03-18 MED ORDER — TORSEMIDE 20 MG PO TABS
20.0000 mg | ORAL_TABLET | Freq: Every day | ORAL | Status: DC
  Filled 2021-03-18: qty 1

## 2021-03-18 MED ORDER — TORSEMIDE 20 MG PO TABS
20.0000 mg | ORAL_TABLET | Freq: Every day | ORAL | Status: DC
  Administered 2021-03-19: 20 mg via ORAL
  Filled 2021-03-18: qty 1

## 2021-03-18 MED ORDER — SODIUM CHLORIDE 0.9% FLUSH: 3.0000 mL | INTRAVENOUS | Status: DC | PRN

## 2021-03-18 MED ORDER — NITROGLYCERIN 0.4 MG SL SUBL: 0.4000 mg | SUBLINGUAL_TABLET | SUBLINGUAL | Status: DC | PRN

## 2021-03-18 MED ORDER — POTASSIUM CHLORIDE 20 MEQ PO PACK
20.0000 meq | PACK | Freq: Every day | ORAL | Status: DC
  Administered 2021-03-19: 20 meq via ORAL
  Filled 2021-03-18: qty 1

## 2021-03-18 MED ORDER — ACETAMINOPHEN 325 MG PO TABS: 650.0000 mg | ORAL_TABLET | Freq: Four times a day (QID) | ORAL | Status: DC | PRN

## 2021-03-18 NOTE — ED Notes (Signed)
Vitals updated, pt updated, MD at bedside and dilt rate increased

## 2021-03-18 NOTE — H&P (Signed)
History and Physical    Shane Diaz [id number] DOB: 1945-08-12 DOA: 03/18/2021  PCP: [name], MD   Patient coming from: Home  Chief Complaint: Palpitations, fast heart rate  HPI: Shane Diaz is a 75 y.o. male with medical history significant for atrial flutter, HFpEF, OSA, HTN, HLD, hx DVT on Eliquis who presents for evaluation of high heart rate. He reports that for the last 2 days he has noticed some palpitations and he recently purchased a pulse oximeter and checked his oxygen saturation on pulse.  He states his ox saturation was in the upper 90s but his pulse was running in the 150's on Saturday.  He continue to monitor his blood pressure and with range between 140-160 throughout the weekend.  This morning he called his PCP and reported his heart rate and they told him to come to the emergency room for evaluation.  He does report having some intermittent chest pressure over the last 2 days that would resolve on its own.  He states he does have some shortness of breath when he walks or exerts himself for the last few days.  He has had a mild nonproductive cough.  He denies having any fever, chills, headache, nasal congestion, change in taste or smell.  He denies abdominal pain nausea vomiting or diarrhea.  Reports he has not had any sick contacts or known COVID-19 exposures. Reports he has an appointment with his cardiologist for routine follow-up at the end of the month. He denies tobacco, alcohol, illicit drug use.   ED Course: Patient is found to be in atrial flutter with rapid ventricular response.  Blood pressure is also elevated.  He was started on a Cardizem infusion in the emergency room.  ER physician discussed with cardiology recommended continuing the Cardizem and if symptoms do not improve to call them back for further recommendations and evaluation.  CBC is unremarkable.  Troponin 28, sodium 139 potassium 4.3 chloride 98 bicarb 32 creatinine 1.17 BUN 17 calcium 9.5 glucose  140, TSH 6.379.  COVID-19 swab negative, influenza A and B negative.  Hospitalist service asked to admit for further management  Review of Systems:  General: Denies weakness, fever, chills, weight loss, night sweats.  Denies dizziness.  Denies change in appetite HENT: Denies head trauma, headache, denies change in hearing, tinnitus.  Denies nasal congestion or bleeding. Denies sore throat.  Denies difficulty swallowing Eyes: Denies blurry vision, pain in eye.  Denies discoloration of eyes. Neck: Denies pain.  Denies swelling.  Denies pain with movement. Cardiovascular: Reports fast heartrate with palpitations. Denies chest pain. Reports chronic edema.  Denies orthopnea Respiratory: Reports shortness of breath, cough.  Denies wheezing.  Denies sputum production Gastrointestinal: Denies abdominal pain, swelling.  Denies nausea, vomiting, diarrhea.  Denies melena.  Denies hematemesis. Musculoskeletal: Denies limitation of movement.  Denies deformity or swelling. Denies arthralgias or myalgias. Genitourinary: Denies pelvic pain.  Denies urinary frequency or hesitancy.  Denies dysuria.  Skin: Denies rash.  Denies petechiae, purpura, ecchymosis. Neurological: Denies syncope.  Denies seizure activity.  Denies paresthesia.  Denies slurred speech, drooping face.  Denies visual change. Psychiatric: Denies depression, anxiety.  Denies hallucinations.  Past Medical History:  Diagnosis Date   Acute diastolic heart failure ([place]) 08/03/2020   Arthritis    Diverticulosis 05/2015   Mild, noted on colonosocpy   Fatty liver 05/14/2015   noted on [place] ABD   History of colon polyps 05/2014   sessile in ascending colon, pedunculated in sigmoid colon, diverticulosis   Hyperlipidemia  pt denies   Hypertension    not currently taking medications per MD   OSA (obstructive sleep apnea) 08/03/2020   Renal cyst 05/2014   Small, Left, noted on [place] ABD   Umbilical hernia    Varicose veins     Past Surgical  History:  Procedure Laterality Date   APPENDECTOMY     childhood   BIOPSY  07/11/2020   Procedure: BIOPSY;  Surgeon: Ladene Artist, MD;  Location: [place];  Service: Endoscopy;;   CARDIOVERSION N/A 07/13/2020   Procedure: CARDIOVERSION;  Surgeon: Josue Hector, MD;  Location: [facility] ENDOSCOPY;  Service: Cardiovascular;  Laterality: N/A;   CARDIOVERSION N/A 08/02/2020   Procedure: CARDIOVERSION;  Surgeon: Werner Lean, MD;  Location: [place];  Service: Cardiovascular;  Laterality: N/A;   COLONOSCOPY W/ POLYPECTOMY  05/23/2014   COLONOSCOPY WITH PROPOFOL N/A 07/11/2020   Procedure: COLONOSCOPY WITH PROPOFOL;  Surgeon: Ladene Artist, MD;  Location: [facility] ENDOSCOPY;  Service: Endoscopy;  Laterality: N/A;   ESOPHAGOGASTRODUODENOSCOPY (EGD) WITH PROPOFOL N/A 07/11/2020   Procedure: ESOPHAGOGASTRODUODENOSCOPY (EGD) WITH PROPOFOL;  Surgeon: Ladene Artist, MD;  Location: [facility] ENDOSCOPY;  Service: Endoscopy;  Laterality: N/A;   TEE WITHOUT CARDIOVERSION N/A 07/13/2020   Procedure: TRANSESOPHAGEAL ECHOCARDIOGRAM (TEE);  Surgeon: Josue Hector, MD;  Location[facility] ENDOSCOPY;  Service: Cardiovascular;  Laterality: N/A;   UMBILICAL HERNIA REPAIR N/A 04/27/2018   Procedure: [place];  Surgeon: Alphonsa Overall, MD;  Location: [facility];  Service: General;  Laterality: N/A;   varicose veins     WRIST SURGERY      Social History  reports that he has never smoked. He has never used smokeless tobacco. He reports current alcohol use. He reports that he does not use drugs.  Allergies  Allergen Reactions   Amiodarone Rash    Family History  Problem Relation Age of Onset   Colon cancer Neg Hx    Esophageal cancer Neg Hx    Pancreatic cancer Neg Hx    Prostate cancer Neg Hx    Rectal cancer Neg Hx    Stomach cancer Neg Hx      Prior to Admission medications   Medication Sig Start Date End Date Taking? Authorizing Provider  apixaban (ELIQUIS) 5 MG TABS  tablet Take 1 tablet (5 mg total) by mouth 2 (two) times daily. 01/21/21  Yes [name], [name], MD  Magnesium Oxide 400 MG CAPS Take 1 capsule (400 mg total) by mouth daily. 12/14/20  Yes [name], [name], MD  nitroGLYCERIN (NITROSTAT) 0.4 MG SL tablet Place 1 tablet (0.4 mg total) under the tongue every 5 (five) minutes x 3 doses as needed for chest pain. 08/03/20  Yes Isaiah Serge, NP  Potassium Chloride ER 20 MEQ TBCR Take 20 mEq by mouth daily. ON DAYS YOU TAKE METOLAZONE TAKE AN EXTRA TABLET (69mq TOTAL) 11/23/20  Yes [name], [name], MD  torsemide (DEMADEX) 20 MG tablet Take 1 tablet (20 mg total) by mouth daily. 01/10/21  Yes [name], MDani Gobble MD    Physical Exam: Vitals:   03/18/21 1900 03/18/21 1915 03/18/21 1930 03/18/21 2003  BP: (!) 138/93 (!) 123/98 (!) 128/95 (!) 130/97  Pulse: (!) 149 (!) 148 (!) 150 (!) 151  Resp: (!) 31 17 (!) 29 (!) 24  Temp:    98.2 F (36.8 C)  TempSrc:    Oral  SpO2: 92% 90% 90% 93%  Weight:      Height:        Constitutional:  NAD, calm, comfortable Vitals:   03/18/21 1900 03/18/21 1915 03/18/21 1930 03/18/21 2003  BP: (!) 138/93 (!) 123/98 (!) 128/95 (!) 130/97  Pulse: (!) 149 (!) 148 (!) 150 (!) 151  Resp: (!) 31 17 (!) 29 (!) 24  Temp:    98.2 F (36.8 C)  TempSrc:    Oral  SpO2: 92% 90% 90% 93%  Weight:      Height:       General: WDWN, Alert and oriented x3.  Eyes: EOMI, PERRL, conjunctivae normal.  Sclera nonicteric HENT:  Delta/AT, external ears normal.  Nares patent without epistasis.  Mucous membranes are moist. Posterior pharynx clear   Neck: Soft, obese, normal range of motion, supple, no masses, Trachea midline Respiratory: Equal breath sounds diminished.  Mild diffuse rales in bases, no rhonchi, no wheezing, no crackles. Normal respiratory effort. No accessory muscle use.  Cardiovascular: Irregular rhythm with tachycardia, no murmurs / rubs / gallops. +2 lower extremity edema. 1+ pedal pulses. Abdomen: Soft, no tenderness,  nondistended, no rebound or guarding.  No masses palpated. Obese. Bowel sounds normoactive Musculoskeletal: FROM. no cyanosis. No joint deformity upper and lower extremities. Normal muscle tone.  Skin: Warm, dry, intact no rashes, lesions, ulcers. No induration Neurologic: CN 2-12 grossly intact.  Normal speech.  Sensation intact to touch. Strength 5/5 in all extremities.   Psychiatric: Normal judgment and insight.  Normal mood.    Labs on Admission: I have personally reviewed following labs and imaging studies  CBC: Recent Labs  Lab 03/18/21 1531  WBC 8.3  NEUTROABS 4.5  HGB 15.3  HCT 49.5  MCV 96.9  PLT 123XX123    Basic Metabolic Panel: Recent Labs  Lab 03/18/21 1531  NA 139  K 4.3  CL 98  CO2 32  GLUCOSE 140*  BUN 17  CREATININE 1.17  CALCIUM 9.5    GFR: Estimated Creatinine Clearance: 68.1 mL/min (by C-G formula based on SCr of 1.17 mg/dL).  Liver Function Tests: No results for input(s): AST, ALT, ALKPHOS, BILITOT, PROT, ALBUMIN in the last 168 hours.  Urine analysis:    Component Value Date/Time   COLORURINE YELLOW 09/24/2020 [place] 09/24/2020 1354   LABSPEC 1.014 09/24/2020 1354   PHURINE 5.0 09/24/2020 1354   GLUCOSEU NEGATIVE 09/24/2020 1354   HGBUR SMALL (A) 09/24/2020 1354   BILIRUBINUR NEGATIVE 09/24/2020 1354   KETONESUR NEGATIVE 09/24/2020 1354   PROTEINUR NEGATIVE 09/24/2020 1354   NITRITE NEGATIVE 09/24/2020 1354   LEUKOCYTESUR NEGATIVE 09/24/2020 1354    Radiological Exams on Admission: DG Chest 2 View  Result Date: 03/18/2021 CLINICAL DATA:  Tachycardia EXAM: CHEST - 2 VIEW COMPARISON:  Chest radiograph 09/24/2020, CT chest 07/14/2020 FINDINGS: The heart is mildly enlarged, unchanged. The mediastinal contours are stable. There is vascular congestion without overt pulmonary edema. There is a trace left pleural effusion with adjacent airspace disease likely reflecting subsegmental atelectasis. There is no other focal  consolidation. There is no right pleural effusion. There is no pneumothorax. There is no acute osseous abnormality. IMPRESSION: 1. Trace left pleural effusion with adjacent airspace disease favored to reflect subsegmental atelectasis. 2. Stable mild cardiomegaly.  No overt pulmonary edema. Electronically Signed   By: Valetta Mole M.D.   On: 03/18/2021 16:00   CT Angio Chest PE W and/or Wo Contrast  Result Date: 03/18/2021 CLINICAL DATA:  Tachycardia for 2 days. Atrial flutter. PE suspected, high prob. EXAM: CT ANGIOGRAPHY CHEST WITH CONTRAST TECHNIQUE: Multidetector CT imaging of the chest was performed  using the standard protocol during bolus administration of intravenous contrast. Multiplanar CT image reconstructions and MIPs were obtained to evaluate the vascular anatomy. CONTRAST:  152m OMNIPAQUE IOHEXOL 350 MG/ML SOLN COMPARISON:  Chest radiograph from earlier today. 07/14/2020 chest CT angiogram. FINDINGS: Cardiovascular: The study is high quality for the evaluation of pulmonary embolism. There are no filling defects in the central, lobar, segmental or subsegmental pulmonary artery branches to suggest acute pulmonary embolism. Atherosclerotic thoracic aorta with mildly dilated 4.1 cm ascending thoracic aorta, stable. Stable dilated main pulmonary artery (3.9 cm diameter). Stable mild cardiomegaly. No significant pericardial fluid/thickening. Left anterior descending and left circumflex coronary atherosclerosis. Mediastinum/Nodes: No discrete thyroid nodules. Unremarkable esophagus. No pathologically enlarged axillary, mediastinal or hilar lymph nodes. Stable small amount of fluid in the right lower posterior mediastinum probably trapped within a fat containing hiatal hernia sac. Lungs/Pleura: No pneumothorax. No pleural effusion. No acute consolidative airspace disease, lung masses or significant pulmonary nodules. Chronic diffuse bronchial wall thickening. Upper abdomen: Simple 2.2 cm lateral upper left  renal cyst. Musculoskeletal: No aggressive appearing focal osseous lesions. Marked thoracic spondylosis. Poorly marginated soft tissue lesions in posterior chest wall bilaterally, right greater than left, located between the inferior scapula and posterolateral ribs, largest 6.2 x 3.3 cm on the right (series 6/image 85), not appreciably changed since 07/14/2020 CT. Review of the MIP images confirms the above findings. IMPRESSION: 1. No pulmonary embolism. 2. Stable mild cardiomegaly. 3. Stable dilated main pulmonary artery, suggesting chronic pulmonary arterial hypertension. 4. Two-vessel coronary atherosclerosis. 5. Chronic diffuse bronchial wall thickening, as can be seen with chronic bronchitis or reactive airways disease. 6. Poorly marginated soft tissue lesions in the posterior chest wall bilaterally, right greater than left, not appreciably changed since 07/14/2020 CT. Findings are nonspecific, with the location most typical of elastofibroma dorsi. Suggest correlation with any clinical symptoms referable to this location. Consider further characterization with MRI chest without and with IV contrast, versus chest CT with IV contrast follow-up in 6 months, as clinically warranted. 7. Aortic Atherosclerosis (ICD10-I70.0). Electronically Signed   By: JIlona SorrelM.D.   On: 03/18/2021 18:10    EKG: Independently reviewed.  EKG shows a wide-complex tachycardia with a flutter initially when he arrives.  There is no acute ST elevation or depression.  QTc was prolonged at 520.  Repeat EKG a few hours later showed tacky arrhythmia with heart rate in the 140s with a QTC of 490  Assessment/Plan Principal Problem:   Atrial flutter with rapid ventricular response  Mr. RBootonis admitted to Cardiac Telemetry floor. He is started on Cardizem infusion in the ER which is continued.  Obtain Echo in am to evaluate wall motion, valvular function and EF Discussed with cardiology by the ER physician who recommended Cardizem  infusion event does not improve to call cardiology for further recommendations BNP is pending Check serial troponin values overnight  Active Problems:   Essential hypertension History of hypertension but not on antihypertensive medication at this time.  States he controls with diet and avoiding salt. Patient is currently on Cardizem.  Monitor blood pressure    Chronic heart failure with preserved ejection fraction (HFpEF) No signs of pulmonary edema at this time.  Patient is on torsemide daily which will be continued.  Monitor daily weights and I&O's    Elevated troponin Troponin mildly elevated initially at 28.  Likely secondary to ischemic demand with a flutter with rapid ventricular response. Will check serial troponins overnight    OSA (obstructive sleep  apnea) CPAP overnight.  RT to follow    Prolonged QT interval Avoid medications which could further prolong QT interval    DVT, bilateral lower limbs (HCC) hx of Patient is anticoagulated on Eliquis and has TED hose in place    Elevated TSH Pt with no history of thyroid disease. TSH elevation may be contributing to current condition and will start low dose synthroid.     DVT prophylaxis: Mr. Mignogna is anticoagulated with Eliquis which is continued.  Code Status:   Full Code  Family Communication:  Diagnosis and plan discussed with patient.  Patient verbalized understanding agrees with plan.  Further recommendations to follow as clinical indicated Disposition Plan:   Patient is from:  Home  Anticipated DC to:  Home  Anticipated DC date:  Anticipate 2 midnight or more stay in the hospital  Anticipated DC barriers: No barriers to discharge identified at this time  Consults called:  Cardiology consulted by ER physician, Dr. Radford Pax Admission status:  Inpatient   Yevonne Aline Japleen Tornow MD [facility]  How to contact the [facility] Attending or Consulting provider [place] or covering provider during after hours [place], for this  patient?   Check the care team in [facility] and look for a) attending/consulting [facility] provider listed and b) the [facility] team listed Log into [email or web address] and use [place]'s universal password to access. If you do not have the password, please contact the hospital operator. Locate the [facility] provider you are looking for under [facility] and page to a number that you can be directly reached. If you still have difficulty reaching the provider, please page the [facility] (Director on Call) for the Hospitalists listed on amion for assistance.  03/18/2021, 8:19 PM

## 2021-03-18 NOTE — Progress Notes (Signed)
Patient refused CPAP HS tonight. Patient stated he doesn't wear CPAP at home. Patient in no distress at this time.

## 2021-03-18 NOTE — ED Provider Notes (Signed)
Emergency Medicine Provider Triage Evaluation Note  Shane Diaz , a 75 y.o. male  was evaluated in triage.  Pt complains of elevated heart rate. States he checked his pulse because he got a new machine and it was high. He saw his pcp about it and they told him to come to the ed. He is completely asymptomatic.  Review of Systems  Positive: tachycardia Negative: Chest pain, sob palpitations  Physical Exam  BP (!) 126/101 (BP Location: Right Arm)   Pulse (!) 155   Temp 99.4 F (37.4 C) (Oral)   Resp (!) 24   Ht '5\' 8"'$  (1.727 m)   Wt 117.9 kg   SpO2 97%   BMI 39.53 kg/m  Gen:   Awake, no distress   Resp:  Normal effort  MSK:   Moves extremities without difficulty  Other:  tachycardia  Medical Decision Making  Medically screening exam initiated at 3:20 PM.  Appropriate orders placed.  Marian Denk was informed that the remainder of the evaluation will be completed by another provider, this initial triage assessment does not replace that evaluation, and the importance of remaining in the ED until their evaluation is complete.     Rodney Booze, PA-C 03/18/21 1521    Lorelle Gibbs, DO 03/20/21 2321

## 2021-03-18 NOTE — ED Provider Notes (Signed)
[facility] EMERGENCY DEPARTMENT Provider Note   CSN: [id number] Arrival date & time: 03/18/21  1441     History Chief Complaint  Patient presents with   Tachycardia    Shahzaib Prichard is a 75 y.o. male.  Mr. Orrie Rampley is a 75 y/o male with PMH of OSA, HTN, HLD, hx DVT on Eliquis, and diastolic CHF who presents with 2 days of tachycardia. Patient states he recently purchased a pulse oximeter and was checking his pulse on Saturday when he noticed his HR was 157. He has continued to check it and his HR averages around 155 bpm. He reports intermittent pressure in his chest that alleviates on its own, cough, and shortness of breath. He saw his PCP today who told him to be seen in the ED. He denies fever, chills, headache, lightheadedness, abdominal pain, increased swelling of his extremities, recent illness, sick contacts, or any changes in his medications.   The history is provided by the patient.  Chest Pain Pain location:  Substernal area Pain quality: pressure   Pain radiates to:  Does not radiate Pain severity:  Mild Timing:  Intermittent Progression:  Waxing and waning Chronicity:  New Relieved by:  Rest Ineffective treatments:  None tried Associated symptoms: palpitations   Associated symptoms: no abdominal pain, no diaphoresis, no dizziness, no fatigue, no fever, no headache, no nausea, no numbness, no shortness of breath, no vomiting and no weakness       Past Medical History:  Diagnosis Date   Acute diastolic heart failure ([place]) 08/03/2020   Arthritis    Diverticulosis 05/2015   Mild, noted on colonosocpy   Fatty liver 05/14/2015   noted on [place] ABD   History of colon polyps 05/2014   sessile in ascending colon, pedunculated in sigmoid colon, diverticulosis   Hyperlipidemia    pt denies   Hypertension    not currently taking medications per MD   OSA (obstructive sleep apnea) 08/03/2020   Renal cyst 05/2014   Small, Left, noted on [place] ABD    Umbilical hernia    Varicose veins     Patient Active Problem List   Diagnosis Date Noted   Atrial flutter with rapid ventricular response ([place]) 03/18/2021   Chronic heart failure with preserved ejection fraction (HFpEF) ([place]) 03/18/2021   Prolonged QT interval 03/18/2021   Acute on chronic diastolic congestive heart failure (HCC)    Cellulitis of leg, right 09/24/2020   Pneumonia due to infectious organism 09/24/2020   Obesity, Class III, BMI 40-49.9 (morbid obesity) ([place]) 09/24/2020   Lactic acidosis 09/24/2020   Sepsis due to pneumonia ([place]) [id number]   Acute diastolic heart failure ([place]) 08/03/2020   OSA (obstructive sleep apnea) 08/03/2020   DVT, bilateral lower limbs (HCC) hx of 08/03/2020   Coronary artery calcification seen on CT scan 08/03/2020   Atrial fibrillation with RVR ([place]) 08/01/2020   Iron deficiency anemia    Occult blood in stools    A-fib ([place]) 07/07/2020   Pneumonia due to COVID-19 virus 05/08/2020   Sepsis ([place]) 05/08/2020   Atrial fibrillation with rapid ventricular response ([place]) 05/08/2020   Elevated troponin 05/08/2020   Essential hypertension 05/08/2020   Varicose veins of leg with complications [id number]   Varicose veins of left lower extremity with complications [id number]   Varicose veins of bilateral lower extremities with other complications [id number]   Special screening for malignant neoplasms, colon 04/11/2014    Past Surgical History:  Procedure Laterality Date   APPENDECTOMY  childhood   BIOPSY  07/11/2020   Procedure: BIOPSY;  Surgeon: Ladene Artist, MD;  Location: [facility];  Service: Endoscopy;;   CARDIOVERSION N/A 07/13/2020   Procedure: CARDIOVERSION;  Surgeon: Josue Hector, MD;  Location: [facility] ENDOSCOPY;  Service: Cardiovascular;  Laterality: N/A;   CARDIOVERSION N/A 08/02/2020   Procedure: CARDIOVERSION;  Surgeon: Werner Lean, MD;  Location: [facility] ENDOSCOPY;  Service: Cardiovascular;  Laterality: N/A;    COLONOSCOPY W/ POLYPECTOMY  05/23/2014   COLONOSCOPY WITH PROPOFOL N/A 07/11/2020   Procedure: COLONOSCOPY WITH PROPOFOL;  Surgeon: Ladene Artist, MD;  Location: [facility] ENDOSCOPY;  Service: Endoscopy;  Laterality: N/A;   ESOPHAGOGASTRODUODENOSCOPY (EGD) WITH PROPOFOL N/A 07/11/2020   Procedure: ESOPHAGOGASTRODUODENOSCOPY (EGD) WITH PROPOFOL;  Surgeon: Ladene Artist, MD;  Location: [facility] ENDOSCOPY;  Service: Endoscopy;  Laterality: N/A;   TEE WITHOUT CARDIOVERSION N/A 07/13/2020   Procedure: TRANSESOPHAGEAL ECHOCARDIOGRAM (TEE);  Surgeon: Josue Hector, MD;  Location: [facility] ENDOSCOPY;  Service: Cardiovascular;  Laterality: N/A;   UMBILICAL HERNIA REPAIR N/A 04/27/2018   Procedure: REPAIR UMBILICAL HERNIA ERAS PATHWAY;  Surgeon: Alphonsa Overall, MD;  Location: [facility] ORS;  Service: General;  Laterality: N/A;   varicose veins     WRIST SURGERY         Family History  Problem Relation Age of Onset   Colon cancer Neg Hx    Esophageal cancer Neg Hx    Pancreatic cancer Neg Hx    Prostate cancer Neg Hx    Rectal cancer Neg Hx    Stomach cancer Neg Hx     Social History   Tobacco Use   Smoking status: Never   Smokeless tobacco: Never  Vaping Use   Vaping Use: Never used  Substance Use Topics   Alcohol use: Yes    Alcohol/week: 0.0 standard drinks    Comment: occassional wine, martini   Drug use: No    Home Medications Prior to Admission medications   Medication Sig Start Date End Date Taking? Authorizing Provider  apixaban (ELIQUIS) 5 MG TABS tablet Take 1 tablet (5 mg total) by mouth 2 (two) times daily. 01/21/21  Yes [name], [name], MD  Magnesium Oxide 400 MG CAPS Take 1 capsule (400 mg total) by mouth daily. 12/14/20  Yes [name], [name], MD  nitroGLYCERIN (NITROSTAT) 0.4 MG SL tablet Place 1 tablet (0.4 mg total) under the tongue every 5 (five) minutes x 3 doses as needed for chest pain. 08/03/20  Yes Isaiah Serge, NP  Potassium Chloride ER 20 MEQ TBCR Take 20 mEq by mouth daily. ON  DAYS YOU TAKE METOLAZONE TAKE AN EXTRA TABLET (78mq TOTAL) 11/23/20  Yes [name], [name], MD  torsemide (DEMADEX) 20 MG tablet Take 1 tablet (20 mg total) by mouth daily. 01/10/21  Yes [name], [name], MD    Allergies    Amiodarone  Review of Systems   Review of Systems  Constitutional:  Negative for chills, diaphoresis, fatigue and fever.  Respiratory:  Negative for shortness of breath.   Cardiovascular:  Positive for chest pain and palpitations. Negative for leg swelling.       Intermittent chest pressure  Gastrointestinal:  Negative for abdominal pain, constipation, diarrhea, nausea and vomiting.  Genitourinary:  Negative for dysuria.  Musculoskeletal:  Negative for myalgias.  Neurological:  Negative for dizziness, syncope, weakness, light-headedness, numbness and headaches.  All other systems reviewed and are negative.  Physical Exam Updated Vital Signs BP (!) 130/97 (BP Location: Left Arm)   Pulse (!) 151   Temp  98.2 F (36.8 C) (Oral)   Resp (!) 24   Ht '5\' 8"'$  (1.727 m)   Wt 117.9 kg   SpO2 93%   BMI 39.53 kg/m   Physical Exam Vitals and nursing note reviewed.  Constitutional:      Appearance: Normal appearance.  HENT:     Head: Normocephalic and atraumatic.  Eyes:     Conjunctiva/sclera: Conjunctivae normal.  Cardiovascular:     Rate and Rhythm: Regular rhythm. Tachycardia present.     Pulses: Normal pulses.  Pulmonary:     Effort: Pulmonary effort is normal. No respiratory distress.     Breath sounds: Normal breath sounds.  Abdominal:     General: There is no distension.     Palpations: Abdomen is soft.     Tenderness: There is no abdominal tenderness.  Musculoskeletal:     Right lower leg: Edema present.     Left lower leg: Edema present.     Comments: Chronic mild bilateral LE edema, no change per patient  Skin:    General: Skin is warm and dry.  Neurological:     General: No focal deficit present.     Mental Status: He is alert and oriented to person,  place, and time.    ED Results / Procedures / Treatments   Labs (all labs ordered are listed, but only abnormal results are displayed) Labs Reviewed  BASIC METABOLIC PANEL - Abnormal; Notable for the following components:      Result Value   Glucose, Bld 140 (*)    All other components within normal limits  TSH - Abnormal; Notable for the following components:   TSH 6.379 (*)    All other components within normal limits  TROPONIN I (HIGH SENSITIVITY) - Abnormal; Notable for the following components:   Troponin I (High Sensitivity) 28 (*)    All other components within normal limits  TROPONIN I (HIGH SENSITIVITY) - Abnormal; Notable for the following components:   Troponin I (High Sensitivity) 28 (*)    All other components within normal limits  RESP PANEL BY RT-PCR (FLU A&B, COVID) [id number]  CBC WITH DIFFERENTIAL/PLATELET  BRAIN NATRIURETIC PEPTIDE    EKG EKG Interpretation  Date/Time:  Monday March 18 2021 15:09:16 EDT Ventricular Rate:  156 PR Interval:    QRS Duration: 124 QT Interval:  336 QTC Calculation: 541 R Axis:   56 Text Interpretation: Wide QRS tachycardia Right bundle branch block Abnormal ECG Confirmed by Lacretia Leigh ([id number]) on 03/18/2021 4:05:55 PM  Radiology DG Chest 2 View  Result Date: 03/18/2021 CLINICAL DATA:  Tachycardia EXAM: CHEST - 2 VIEW COMPARISON:  Chest radiograph 09/24/2020, CT chest 07/14/2020 FINDINGS: The heart is mildly enlarged, unchanged. The mediastinal contours are stable. There is vascular congestion without overt pulmonary edema. There is a trace left pleural effusion with adjacent airspace disease likely reflecting subsegmental atelectasis. There is no other focal consolidation. There is no right pleural effusion. There is no pneumothorax. There is no acute osseous abnormality. IMPRESSION: 1. Trace left pleural effusion with adjacent airspace disease favored to reflect subsegmental atelectasis. 2. Stable mild cardiomegaly.  No overt  pulmonary edema. Electronically Signed   By: Valetta Mole M.D.   On: 03/18/2021 16:00   CT Angio Chest PE W and/or Wo Contrast  Result Date: 03/18/2021 CLINICAL DATA:  Tachycardia for 2 days. Atrial flutter. PE suspected, high prob. EXAM: CT ANGIOGRAPHY CHEST WITH CONTRAST TECHNIQUE: Multidetector CT imaging of the chest was performed using the standard protocol during bolus  administration of intravenous contrast. Multiplanar CT image reconstructions and MIPs were obtained to evaluate the vascular anatomy. CONTRAST:  155m OMNIPAQUE IOHEXOL 350 MG/ML SOLN COMPARISON:  Chest radiograph from earlier today. 07/14/2020 chest CT angiogram. FINDINGS: Cardiovascular: The study is high quality for the evaluation of pulmonary embolism. There are no filling defects in the central, lobar, segmental or subsegmental pulmonary artery branches to suggest acute pulmonary embolism. Atherosclerotic thoracic aorta with mildly dilated 4.1 cm ascending thoracic aorta, stable. Stable dilated main pulmonary artery (3.9 cm diameter). Stable mild cardiomegaly. No significant pericardial fluid/thickening. Left anterior descending and left circumflex coronary atherosclerosis. Mediastinum/Nodes: No discrete thyroid nodules. Unremarkable esophagus. No pathologically enlarged axillary, mediastinal or hilar lymph nodes. Stable small amount of fluid in the right lower posterior mediastinum probably trapped within a fat containing hiatal hernia sac. Lungs/Pleura: No pneumothorax. No pleural effusion. No acute consolidative airspace disease, lung masses or significant pulmonary nodules. Chronic diffuse bronchial wall thickening. Upper abdomen: Simple 2.2 cm lateral upper left renal cyst. Musculoskeletal: No aggressive appearing focal osseous lesions. Marked thoracic spondylosis. Poorly marginated soft tissue lesions in posterior chest wall bilaterally, right greater than left, located between the inferior scapula and posterolateral ribs, largest  6.2 x 3.3 cm on the right (series 6/image 85), not appreciably changed since 07/14/2020 CT. Review of the MIP images confirms the above findings. IMPRESSION: 1. No pulmonary embolism. 2. Stable mild cardiomegaly. 3. Stable dilated main pulmonary artery, suggesting chronic pulmonary arterial hypertension. 4. Two-vessel coronary atherosclerosis. 5. Chronic diffuse bronchial wall thickening, as can be seen with chronic bronchitis or reactive airways disease. 6. Poorly marginated soft tissue lesions in the posterior chest wall bilaterally, right greater than left, not appreciably changed since 07/14/2020 CT. Findings are nonspecific, with the location most typical of elastofibroma dorsi. Suggest correlation with any clinical symptoms referable to this location. Consider further characterization with MRI chest without and with IV contrast, versus chest CT with IV contrast follow-up in 6 months, as clinically warranted. 7. Aortic Atherosclerosis (ICD10-I70.0). Electronically Signed   By: JIlona SorrelM.D.   On: 03/18/2021 18:10    Procedures Procedures   Medications Ordered in ED Medications  diltiazem (CARDIZEM) 1 mg/mL load via infusion 20 mg (20 mg Intravenous Bolus from Bag 03/18/21 1902)    And  diltiazem (CARDIZEM) 125 mg in dextrose 5% 125 mL (1 mg/mL) infusion (10 mg/hr Intravenous Rate/Dose Change 03/18/21 2002)  iohexol (OMNIPAQUE) 350 MG/ML injection 100 mL (100 mLs Intravenous Contrast Given 03/18/21 1735)    ED Course  I have reviewed the triage vital signs and the nursing notes.  Pertinent labs & imaging results that were available during my care of the patient were reviewed by me and considered in my medical decision making (see chart for details).    MDM Rules/Calculators/A&P                           Patient is 75y/o male with PMH of OSA, HTN, HLD, and diastolic CHF who presents with 2 days of tachycardia to 157 bpm. Patient reports some intermittent chest pressure, cough, and  shortness of breath. Differential to include ACS, PE, pneumonia, heart failure exacerbation, metabolic derangement.  CBC and BMP unremarkable for infectious etiology. Troponin of 28, TSH elevated to 6.379, COVID test negative. BNP pending. EKG showed wide QRS tachycardia. CXR showed trace left pleural effusion. CT Angiography of chest performed showed no acute findings and no evidence of PE.  Due to patient's  history of ischemic heart disease and persistent tachycardia, I believe patient requires admission for inpatient treatment and investigation of the cause of his symptoms.   Final Clinical Impression(s) / ED Diagnoses Final diagnoses:  Atrial flutter, unspecified type [facility])    Rx / DC Orders ED Discharge Orders     None        Forrest Demuro, Kathleen Argue 03/18/21 2023    Lacretia Leigh, MD 03/18/21 2248

## 2021-03-18 NOTE — ED Triage Notes (Signed)
Pt presents from PCP for eval of tachycardia since Saturday. EKG done there which shows atrial flutter with RBBB at rate of 153. Denies CP or shob.

## 2021-03-18 NOTE — ED Provider Notes (Addendum)
I provided a substantive portion of the care of this patient.  I personally performed the entirety of the medical decision making for this encounter.  EKG Interpretation  Date/Time:  Monday March 18 2021 15:09:16 EDT Ventricular Rate:  156 PR Interval:    QRS Duration: 124 QT Interval:  336 QTC Calculation: 541 R Axis:   56 Text Interpretation: Wide QRS tachycardia Right bundle branch block Abnormal ECG Confirmed by Lacretia Leigh ([id number]) on 03/18/2021 4:79:45 PM   75 year old male presents with shortness of breath and increased cough.  Was found to be in sinus tach by his PCP after having 2 days of the symptoms.  Center for further evaluation.  EKG confirmed sinus tach.  Does have a history of DVT and does take Eliquis.  Chest x-ray with possible pneumonia.  He denies any fever.  We will CT angio chest and patient will require admission  7:48 PM   Patient EKG consistent actually with a flutter.  He does have a history of this.  Patient bolused with Cardizem and placed on a drip.  No real response to his heart rate with this.  Patient states that he has been compliant with Eliquis.  Discussed with Dr. Radford Pax from cardiology who recommends that patients not respond to this that he get admitted.  Patient initially did not want to stay in the hospital but he has now changed his mind and is willing to be admitted.  Will consult hospitalist service  CRITICAL CARE Performed by: Leota Jacobsen Total critical care time: 55 minutes Critical care time was exclusive of separately billable procedures and treating other patients. Critical care was necessary to treat or prevent imminent or life-threatening deterioration. Critical care was time spent personally by me on the following activities: development of treatment plan with patient and/or surrogate as well as nursing, discussions with consultants, evaluation of patient's response to treatment, examination of patient, obtaining history from patient or  surrogate, ordering and performing treatments and interventions, ordering and review of laboratory studies, ordering and review of radiographic studies, pulse oximetry and re-evaluation of patient's condition.    Lacretia Leigh, MD 03/18/21 1710    Lacretia Leigh, MD 03/18/21 1949

## 2021-03-19 ENCOUNTER — Observation Stay (HOSPITAL_BASED_OUTPATIENT_CLINIC_OR_DEPARTMENT_OTHER): Payer: Medicare Other

## 2021-03-19 DIAGNOSIS — I4892 Unspecified atrial flutter: Secondary | ICD-10-CM

## 2021-03-19 DIAGNOSIS — I824Y3 Acute embolism and thrombosis of unspecified deep veins of proximal lower extremity, bilateral: Secondary | ICD-10-CM | POA: Diagnosis not present

## 2021-03-19 DIAGNOSIS — R778 Other specified abnormalities of plasma proteins: Secondary | ICD-10-CM | POA: Diagnosis not present

## 2021-03-19 DIAGNOSIS — I480 Paroxysmal atrial fibrillation: Secondary | ICD-10-CM

## 2021-03-19 DIAGNOSIS — R9431 Abnormal electrocardiogram [ECG] [EKG]: Secondary | ICD-10-CM

## 2021-03-19 DIAGNOSIS — I5032 Chronic diastolic (congestive) heart failure: Secondary | ICD-10-CM

## 2021-03-19 DIAGNOSIS — G4733 Obstructive sleep apnea (adult) (pediatric): Secondary | ICD-10-CM

## 2021-03-19 DIAGNOSIS — I10 Essential (primary) hypertension: Secondary | ICD-10-CM

## 2021-03-19 LAB — BASIC METABOLIC PANEL
Anion gap: 12 (ref 5–15)
BUN: 16 mg/dL (ref 8–23)
CO2: 30 mmol/L (ref 22–32)
Calcium: 9.5 mg/dL (ref 8.9–10.3)
Chloride: 97 mmol/L — ABNORMAL LOW (ref 98–111)
Creatinine, Ser: 1.16 mg/dL (ref 0.61–1.24)
GFR, Estimated: >60 mL/min (ref >60)
Glucose, Bld: 125 mg/dL — ABNORMAL HIGH (ref 70–99)
Potassium: 4.6 mmol/L (ref 3.5–5.1)
Sodium: 139 mmol/L (ref 135–145)

## 2021-03-19 LAB — ECHOCARDIOGRAM COMPLETE
AR max vel: 2.73 cm2
AV Area VTI: 2.62 cm2
AV Area mean vel: 2.62 cm2
AV Mean grad: 4 mmHg
AV Peak grad: 7 mmHg
Ao pk vel: 1.32 m/s
Area-P 1/2: 2.99 cm2
Height: 68 in
MV VTI: 2.44 cm2
S' Lateral: 3.2 cm
Weight: 4160 oz

## 2021-03-19 LAB — TROPONIN I (HIGH SENSITIVITY): Troponin I (High Sensitivity): 32 ng/L — ABNORMAL HIGH (ref <18)

## 2021-03-19 MED ORDER — PERFLUTREN LIPID MICROSPHERE
1.0000 mL | INTRAVENOUS | Status: DC | PRN
  Administered 2021-03-19: 5 mL via INTRAVENOUS
  Filled 2021-03-19: qty 10

## 2021-03-19 MED ORDER — DILTIAZEM HCL 30 MG PO TABS
60.0000 mg | ORAL_TABLET | Freq: Once | ORAL | Status: AC
  Administered 2021-03-19: 60 mg via ORAL
  Filled 2021-03-19: qty 2

## 2021-03-19 MED ORDER — LEVOTHYROXINE SODIUM 25 MCG PO TABS
25.0000 ug | ORAL_TABLET | Freq: Every day | ORAL | 2 refills | Status: DC
Start: 2021-03-20 — End: 2021-03-19

## 2021-03-19 MED ORDER — DILTIAZEM HCL ER COATED BEADS 180 MG PO CP24
180.0000 mg | ORAL_CAPSULE | Freq: Every day | ORAL | Status: DC
  Filled 2021-03-19: qty 1

## 2021-03-19 MED ORDER — DILTIAZEM HCL ER COATED BEADS 180 MG PO CP24: 180.0000 mg | ORAL_CAPSULE | Freq: Every day | ORAL | 2 refills | Status: DC

## 2021-03-19 NOTE — Discharge Summary (Signed)
Physician Discharge Summary  Harvinder Kozub [id number] DOB: [birth year] DOA: 03/18/2021  PCP: [name], MD  Admit date: 03/18/2021 Discharge date: 03/19/2021  Admitted From: Home  Discharge disposition: Home   Recommendations for Outpatient Follow-Up:   Follow up with your primary care provider in one week.  Check CBC, BMP, magnesium in the next visit Patient will need to follow-up with his cardiologist as has been scheduled  Discharge Diagnosis:   Principal Problem:   Atrial flutter with rapid ventricular response (HCC) Active Problems:   Elevated troponin   Essential hypertension   OSA (obstructive sleep apnea)   DVT, bilateral lower limbs (HCC) hx of   Chronic heart failure with preserved ejection fraction (HFpEF) (HCC)   Prolonged QT interval  Discharge Condition: Improved.  Diet recommendation: Low sodium, heart healthy.  Wound care: None.  Code status: Full.   History of Present Illness:   Shane Diaz is a 75 y.o. male with medical history significant for atrial flutter, HFpEF, OSA, HTN, HLD, hx DVT on Eliquis presented to hospital with tachycardia and palpitation.  He had noted his heartbeat to be high so he went to his primary care physician who advised him to come to the hospital.  He has been having some chest pressure as well and dyspnea on exertion.  In the ED, patient was found to be in atrial flutter with rapid ventricular response and was started on Cardizem drip.  Patient was then admitted hospital for further evaluation and treatment.    Hospital Course:   Following conditions were addressed during hospitalization as listed below,  Atrial flutter with rapid ventricular response  Improved after Cardizem drip.  Patient has been initiated on oral Cardizem as per cardiology recommendation.  Patient does have an appointment to follow-up with outpatient cardiology this month.  He was advised to keep that appointment.  Patient is already on Eliquis  from outpatient.  He was advised to keep that appointment.  2D echocardiogram showed LV ejection fraction of 55%.   Essential hypertension Not on medications at home.  Will be initiated on Cardizem as per cardiology.     Chronic heart failure with preserved ejection fraction (HFpEF) Continue torsemide for home.     Elevated troponin Nonspecific likely secondary to demand ischemia from atrial flutter.   OSA (obstructive sleep apnea), oxygen dependent at nighttime CPAP at home, patient also uses oxygen during the nighttime.     DVT, bilateral lower limbs  Continue anticoagulation with Eliquis    Elevated TSH Will need repeat TSH in 4 to 6 weeks.  Might need Synthroid in the future but right now will be we will hold off on it.  Call the patient's daughter to hold off on Synthroid.   Disposition.  At this time, patient is stable for disposition with outpatient PCP and cardiology follow-up.  Spoke with the patient daughter about disposition home and the need for follow-up.  Medical Consultants:   Cardiology  Procedures:    None Subjective:   Today, patient seen and examined at bedside.  Was seen by cardiology as well.  Denies any chest pain shortness of breath.  Discharge Exam:   Vitals:   03/19/21 1030 03/19/21 1100  BP: (!) 110/50   Pulse: 74 74  Resp: (!) 21 (!) 24  Temp:    SpO2: 95% 94%   Vitals:   03/19/21 0945 03/19/21 1000 03/19/21 1030 03/19/21 1100  BP:   (!) 110/50   Pulse: 73 73 74 74  Resp: (!)  24 (!) 24 (!) 21 (!) 24  Temp:      TempSrc:      SpO2: 91% 90% 95% 94%  Weight:      Height:       General: Alert awake, not in obvious distress, on nasal cannula oxygen, obese HENT: pupils equally reacting to light,  No scleral pallor or icterus noted. Oral mucosa is moist.  Chest:  Clear breath sounds.  Diminished breath sounds bilaterally. No crackles or wheezes.  CVS: S1 &S2 heard. No murmur.  Irregular rhythm but controlled Abdomen: Soft, nontender,  nondistended.  Bowel sounds are heard.   Extremities: No cyanosis, clubbing with trace edema.  Peripheral pulses are palpable. Psych: Alert, awake and oriented, normal mood CNS:  No cranial nerve deficits.  Power equal in all extremities.   Skin: Warm and dry.  No rashes noted.  The results of significant diagnostics from this hospitalization (including imaging, microbiology, ancillary and laboratory) are listed below for reference.     Diagnostic Studies:   DG Chest 2 View  Result Date: 03/18/2021 CLINICAL DATA:  Tachycardia EXAM: CHEST - 2 VIEW COMPARISON:  Chest radiograph 09/24/2020, CT chest 07/14/2020 FINDINGS: The heart is mildly enlarged, unchanged. The mediastinal contours are stable. There is vascular congestion without overt pulmonary edema. There is a trace left pleural effusion with adjacent airspace disease likely reflecting subsegmental atelectasis. There is no other focal consolidation. There is no right pleural effusion. There is no pneumothorax. There is no acute osseous abnormality. IMPRESSION: 1. Trace left pleural effusion with adjacent airspace disease favored to reflect subsegmental atelectasis. 2. Stable mild cardiomegaly.  No overt pulmonary edema. Electronically Signed   By: Valetta Mole M.D.   On: 03/18/2021 16:00   CT Angio Chest PE W and/or Wo Contrast  Result Date: 03/18/2021 CLINICAL DATA:  Tachycardia for 2 days. Atrial flutter. PE suspected, high prob. EXAM: CT ANGIOGRAPHY CHEST WITH CONTRAST TECHNIQUE: Multidetector CT imaging of the chest was performed using the standard protocol during bolus administration of intravenous contrast. Multiplanar CT image reconstructions and MIPs were obtained to evaluate the vascular anatomy. CONTRAST:  147m OMNIPAQUE IOHEXOL 350 MG/ML SOLN COMPARISON:  Chest radiograph from earlier today. 07/14/2020 chest CT angiogram. FINDINGS: Cardiovascular: The study is high quality for the evaluation of pulmonary embolism. There are no filling  defects in the central, lobar, segmental or subsegmental pulmonary artery branches to suggest acute pulmonary embolism. Atherosclerotic thoracic aorta with mildly dilated 4.1 cm ascending thoracic aorta, stable. Stable dilated main pulmonary artery (3.9 cm diameter). Stable mild cardiomegaly. No significant pericardial fluid/thickening. Left anterior descending and left circumflex coronary atherosclerosis. Mediastinum/Nodes: No discrete thyroid nodules. Unremarkable esophagus. No pathologically enlarged axillary, mediastinal or hilar lymph nodes. Stable small amount of fluid in the right lower posterior mediastinum probably trapped within a fat containing hiatal hernia sac. Lungs/Pleura: No pneumothorax. No pleural effusion. No acute consolidative airspace disease, lung masses or significant pulmonary nodules. Chronic diffuse bronchial wall thickening. Upper abdomen: Simple 2.2 cm lateral upper left renal cyst. Musculoskeletal: No aggressive appearing focal osseous lesions. Marked thoracic spondylosis. Poorly marginated soft tissue lesions in posterior chest wall bilaterally, right greater than left, located between the inferior scapula and posterolateral ribs, largest 6.2 x 3.3 cm on the right (series 6/image 85), not appreciably changed since 07/14/2020 CT. Review of the MIP images confirms the above findings. IMPRESSION: 1. No pulmonary embolism. 2. Stable mild cardiomegaly. 3. Stable dilated main pulmonary artery, suggesting chronic pulmonary arterial hypertension. 4. Two-vessel coronary atherosclerosis. 5.  Chronic diffuse bronchial wall thickening, as can be seen with chronic bronchitis or reactive airways disease. 6. Poorly marginated soft tissue lesions in the posterior chest wall bilaterally, right greater than left, not appreciably changed since 07/14/2020 CT. Findings are nonspecific, with the location most typical of elastofibroma dorsi. Suggest correlation with any clinical symptoms referable to this  location. Consider further characterization with MRI chest without and with IV contrast, versus chest CT with IV contrast follow-up in 6 months, as clinically warranted. 7. Aortic Atherosclerosis (ICD10-I70.0). Electronically Signed   By: Ilona Sorrel M.D.   On: 03/18/2021 18:10     Labs:   Basic Metabolic Panel: Recent Labs  Lab 03/18/21 1531 03/19/21 0500  NA 139 139  K 4.3 4.6  CL 98 97*  CO2 32 30  GLUCOSE 140* 125*  BUN 17 16  CREATININE 1.17 1.16  CALCIUM 9.5 9.5   GFR Estimated Creatinine Clearance: 68.6 mL/min (by C-G formula based on SCr of 1.16 mg/dL). Liver Function Tests: No results for input(s): AST, ALT, ALKPHOS, BILITOT, PROT, ALBUMIN in the last 168 hours. No results for input(s): LIPASE, AMYLASE in the last 168 hours. No results for input(s): AMMONIA in the last 168 hours. Coagulation profile No results for input(s): INR, PROTIME in the last 168 hours.  CBC: Recent Labs  Lab 03/18/21 1531  WBC 8.3  NEUTROABS 4.5  HGB 15.3  HCT 49.5  MCV 96.9  PLT 199   Cardiac Enzymes: No results for input(s): CKTOTAL, CKMB, CKMBINDEX, TROPONINI in the last 168 hours. BNP: Invalid input(s): POCBNP CBG: No results for input(s): GLUCAP in the last 168 hours. D-Dimer No results for input(s): DDIMER in the last 72 hours. Hgb A1c No results for input(s): HGBA1C in the last 72 hours. Lipid Profile No results for input(s): CHOL, HDL, LDLCALC, TRIG, CHOLHDL, LDLDIRECT in the last 72 hours. Thyroid function studies Recent Labs    03/18/21 1638  TSH 6.379*   Anemia work up No results for input(s): VITAMINB12, FOLATE, FERRITIN, TIBC, IRON, RETICCTPCT in the last 72 hours. Microbiology Recent Results (from the past 240 hour(s))  Resp Panel by RT-PCR (Flu A&B, Covid) Nasopharyngeal Swab     Status: None   Collection Time: 03/18/21  4:40 PM   Specimen: Nasopharyngeal Swab; Nasopharyngeal(NP) swabs in vial transport medium  Result Value Ref Range Status   SARS  Coronavirus 2 by RT PCR NEGATIVE NEGATIVE Final    Comment: (NOTE) SARS-CoV-2 target nucleic acids are NOT DETECTED.  The SARS-CoV-2 RNA is generally detectable in upper respiratory specimens during the acute phase of infection. The lowest concentration of SARS-CoV-2 viral copies this assay can detect is 138 copies/mL. A negative result does not preclude SARS-Cov-2 infection and should not be used as the sole basis for treatment or other patient management decisions. A negative result may occur with  improper specimen collection/handling, submission of specimen other than nasopharyngeal swab, presence of viral mutation(s) within the areas targeted by this assay, and inadequate number of viral copies(<138 copies/mL). A negative result must be combined with clinical observations, patient history, and epidemiological information. The expected result is Negative.  Fact Sheet for Patients:  EntrepreneurPulse.com.au  Fact Sheet for Healthcare Providers:  IncredibleEmployment.be  This test is no t yet approved or cleared by the [place] FDA and  has been authorized for detection and/or diagnosis of SARS-CoV-2 by FDA under an Emergency Use Authorization (EUA). This EUA will remain  in effect (meaning this test can be used) for the duration of  the COVID-19 declaration under Section 564(b)(1) of the Act, 21 U.S.C.section 360bbb-3(b)(1), unless the authorization is terminated  or revoked sooner.       Influenza A by PCR NEGATIVE NEGATIVE Final   Influenza B by PCR NEGATIVE NEGATIVE Final    Comment: (NOTE) The Xpert Xpress SARS-CoV-2/FLU/RSV plus assay is intended as an aid in the diagnosis of influenza from Nasopharyngeal swab specimens and should not be used as a sole basis for treatment. Nasal washings and aspirates are unacceptable for Xpert Xpress SARS-CoV-2/FLU/RSV testing.  Fact Sheet for  Patients: EntrepreneurPulse.com.au  Fact Sheet for Healthcare Providers: IncredibleEmployment.be  This test is not yet approved or cleared by the [place] FDA and has been authorized for detection and/or diagnosis of SARS-CoV-2 by FDA under an Emergency Use Authorization (EUA). This EUA will remain in effect (meaning this test can be used) for the duration of the COVID-19 declaration under Section 564(b)(1) of the Act, 21 U.S.C. section 360bbb-3(b)(1), unless the authorization is terminated or revoked.  Performed at [facility], [place] [address], [place], [place] [address]      Discharge Instructions:   Discharge Instructions     Diet - low sodium heart healthy   Complete by: As directed    Discharge instructions   Complete by: As directed    Follow-up with your primary care physician in 1 week.  Continue taking your blood thinner.  You have been prescribed a medication to keep your heartbeat under control.  If you experience chest pain shortness of breath dizziness or other symptoms please seek medical attention.  Do not overexert.  Follow-up with cardiologist Dr. Sallyanne Kuster as has been scheduled this August.   Increase activity slowly   Complete by: As directed       Allergies as of 03/19/2021       Reactions   Amiodarone Rash        Medication List     TAKE these medications    diltiazem 180 MG 24 hr capsule Commonly known as: CARDIZEM CD Take 1 capsule (180 mg total) by mouth daily.   Eliquis 5 MG Tabs tablet Generic drug: apixaban Take 1 tablet (5 mg total) by mouth 2 (two) times daily.     Magnesium Oxide 400 MG Caps Take 1 capsule (400 mg total) by mouth daily.   nitroGLYCERIN 0.4 MG SL tablet Commonly known as: NITROSTAT Place 1 tablet (0.4 mg total) under the tongue every 5 (five) minutes x 3 doses as needed for chest pain.   Potassium Chloride ER 20 MEQ Tbcr Take 20 mEq by mouth daily. ON DAYS YOU TAKE  METOLAZONE TAKE AN EXTRA TABLET (43mq TOTAL)   torsemide 20 MG tablet Commonly known as: DEMADEX Take 1 tablet (20 mg total) by mouth daily.          Time coordinating discharge: 39 minutes  Signed:  Ilhan Debenedetto  [facility] 03/19/2021, 2:00 PM

## 2021-03-19 NOTE — ED Notes (Signed)
Pt resting. O2 applied. Pt sleeps with CPAP.

## 2021-03-19 NOTE — Care Management CC44 (Signed)
Condition Code 44 Documentation Completed  Patient Details  Name: Shane Diaz MRN: [id number] Date of Birth: 07/01/1946   Condition Code 44 given:  Yes Patient signature on Condition Code 44 notice:  Yes Documentation of 2 MD's agreement:  Yes Code 44 added to claim:  Yes    Fuller Mandril, RN 03/19/2021, 12:51 PM

## 2021-03-19 NOTE — Care Management CC44 (Signed)
Condition Code 44 Documentation Completed  Patient Details  Name: Shane Diaz MRN: [id number] Date of Birth: 1945-12-21   Condition Code 44 given:  Yes Patient signature on Condition Code 44 notice:  Yes Documentation of 2 MD's agreement:  Yes Code 44 added to claim:  Yes    Fuller Mandril, RN 03/19/2021, 12:51 PM

## 2021-03-19 NOTE — Consult Note (Addendum)
Cardiology Consultation:   Patient ID: Taner Mcclendon; [id number]:[id number]; 1946-02-01   Admit date: 03/18/2021 Date of Consult: 03/19/2021  Primary Care Provider: [name], MD Primary Cardiologist: Dr. Sallyanne Kuster, MD   Patient Profile:   Christina Hodsdon is a 75 y.o. male with a hx of chronic diastolic CHF, atrial fibrillation/flutter s/p multiple recent DCCVs on Amiodarone and Eliquis, recurrent DVTs, chronic superficial venous insufficiency with ultrasound demonstrated reflux, varicose veins s/p ablation, HTN, HLD, OSA, morbid obesity, Covid PNA 05/2020, and fatty liver   who is being seen today for the evaluation of atrial fibrillation at the request of Dr. Tonie Griffith.  History of Present Illness:   Mr. Balsamo is a 75yo M with a hx as stated above who presented to [facility] 03/18/21 for the evaluation of elevated HR. He reports that he was in his usual state of health prior to Saturday when he was checking his blood pressure and noticed that his HR was in the 150 range. He reports that he had no sensation of palpitations. He rechecked HR/BP on Sunday and states rates remained elevated. BP was stable. He called his PCP yesterday morning in an attempt to get an appointment and was referred to the ED for further evaluation.   In the ED, he was found to be in atrial fibrillation with RVR. He was started on IV diltiazem. HsT 28>>28>>32, not consistent with ACS. BNP 404. Cr stable at 1.16. TSH elevated at 6.379. CXR with trace let pleural effusion with adjacent airspace disease favored to reflect subsegmental atelectasis and no overt pulmonary edema. Chest CTA with no PE however incidental finding of poorly marginated soft tissue lesions in the posterior chest wall bilaterally, right greater than left, not appreciably changed since 07/14/2020 CT with recommendations for further characterization with MRI chest without and with IV contrast, versus chest CT with IV contrast follow-up in 6 months. Also with 4.1cm dilated  ascending thoracic aorta. He denies chest pain, recent SOB, PND, orthopnea, recent; illness with fever, chills or cough. He reports compliance with medications and reports he continues to follow a low sodium diet.   Mr. Dornbush was diagnosed with right distal femoral and popliteal DVT in 2015 and was started on Eliquis>>followed with [facility] for this. Repeat US showed partial resolution however chronically occluded tibioperoneal venous trunk. He had lots of LE pain related to varicose veins and underwent venous vein ablation and laser ablation/stab phlebotomies of the left great saphenous vein/left leg 06/2015. He has continued to have lower extremity swelling mainly in the LLE and was referred to cardiology 05/2019 for further evaluation and was advised to continue with leg elevation and compression stockings. He reports this has been stable.   He was admitted with COVID 05/2020 and developed atrial fibrillation with RVR but spontaneously converted to NSR after being placed on IV Diltiazem. HsT were mildly elevated to the low 100's felt to be in the setting of demand ischemia.   In 07/2020 he was admitted for acute hypoxic respiratory failure due to acute diastolic CHF. He was also found to be in new onset atrial flutter on admission. Echocardiogram showed and LVEF of 50-55% on with normal wall motion and moderate LVH. RV was mildly enlarged with mildly reduced systolic function and mildly elevated PASP of 37.8 mmHg. He was diuresed with IV Lasix and then transitioned to PO. He was anemic and found to have a positive hemoccult stool. GI was consulted and he underwent EGD/colonoscopy with no evidence of active bleeding. In regards to  his atrial flutter, he was started on amiodarone, lopressor and diltiazem however rates were not controlled. He then underwent successful TEE/DCCV 07/13/2020. Amio and diltiazem were discontinued and he was discharged on lasix, Lopressor and Eliquis.   He was readmitted the end of  December 2021 with acute CHF once again and was back in atrial fibrillation. He underwent repeat DCCV x2 in the ED but reverted back to AF within seconds. He was restarted on IV diltiazem and amiodarone and was diuresed for CHF. DCCV was repeated during that hospitalization after diuresis but was still unsuccessful. Plan was to try once again after 4 weeks of amiodarone. He was seen in follow up 08/2020 and remained in AF however declined repeat cardioversion.   He was admitted once again 09/2020 for sepsis secondary to cellulitis, PNA, along with CHF and AF with RVR. Atrial fibrillation was managed conservatively and his volume status improved with diuresis. His discharge weight was estimated at 270lb. Since that time he has been followed by Dr. Sallyanne Kuster very closely in the OP setting. He reverted back to consuming high salt foods and gained 16lbs between discharge and follow up. He had a rash with the amiodarone and self discontinued with improvement. He was in NSR at that time. Metolazone was added to his regimen due to persistent fluid volume overload, mostly due to diet indiscretions with sodium however this also did not help therefore he was started on torsemide '40mg'$  QD 12/14/2020.   In most recent follow up 01/10/2021 his rash had returned despite being off Amiodarone. He remained reluctant to proceed with a sleep study>>as this was felt to be the underlying cause of his right heart failure due to cor pulmonale. He was continued on torsemide 40 with a goal weight at 260lb. Notes suggest that if atrial fibrillation becomes a recurrent problem, may want to try dofetilide before committing to amiodarone once again.   Past Medical History:  Diagnosis Date   Acute diastolic heart failure ([place]) 08/03/2020   Arthritis    Diverticulosis 05/2015   Mild, noted on colonosocpy   Fatty liver 05/14/2015   noted on [place] ABD   History of colon polyps 05/2014   sessile in ascending colon, pedunculated in sigmoid  colon, diverticulosis   Hyperlipidemia    pt denies   Hypertension    not currently taking medications per MD   OSA (obstructive sleep apnea) 08/03/2020   Renal cyst 05/2014   Small, Left, noted on [place] ABD   Umbilical hernia    Varicose veins     Past Surgical History:  Procedure Laterality Date   APPENDECTOMY     childhood   BIOPSY  07/11/2020   Procedure: BIOPSY;  Surgeon: Ladene Artist, MD;  Location: [place];  Service: Endoscopy;;   CARDIOVERSION N/A 07/13/2020   Procedure: CARDIOVERSION;  Surgeon: Josue Hector, MD;  Location: [facility] ENDOSCOPY;  Service: Cardiovascular;  Laterality: N/A;   CARDIOVERSION N/A 08/02/2020   Procedure: CARDIOVERSION;  Surgeon: Werner Lean, MD;  Location: [place] ENDOSCOPY;  Service: Cardiovascular;  Laterality: N/A;   COLONOSCOPY W/ POLYPECTOMY  05/23/2014   COLONOSCOPY WITH PROPOFOL N/A 07/11/2020   Procedure: COLONOSCOPY WITH PROPOFOL;  Surgeon: Ladene Artist, MD;  Location: [facility] ENDOSCOPY;  Service: Endoscopy;  Laterality: N/A;   ESOPHAGOGASTRODUODENOSCOPY (EGD) WITH PROPOFOL N/A 07/11/2020   Procedure: ESOPHAGOGASTRODUODENOSCOPY (EGD) WITH PROPOFOL;  Surgeon: Ladene Artist, MD;  Location: [facility] ENDOSCOPY;  Service: Endoscopy;  Laterality: N/A;   TEE WITHOUT CARDIOVERSION N/A 07/13/2020  Procedure: TRANSESOPHAGEAL ECHOCARDIOGRAM (TEE);  Surgeon: Josue Hector, MD;  Location: [facility] ENDOSCOPY;  Service: Cardiovascular;  Laterality: N/A;   UMBILICAL HERNIA REPAIR N/A 04/27/2018   Procedure: [place];  Surgeon: Alphonsa Overall, MD;  Location: [facility] ORS;  Service: General;  Laterality: N/A;   varicose veins     WRIST SURGERY       Prior to Admission medications   Medication Sig Start Date End Date Taking? Authorizing Provider  apixaban (ELIQUIS) 5 MG TABS tablet Take 1 tablet (5 mg total) by mouth 2 (two) times daily. 01/21/21  Yes [name], [name], MD  Magnesium Oxide 400 MG CAPS Take 1 capsule (400 mg total) by  mouth daily. 12/14/20  Yes [name], [name], MD  nitroGLYCERIN (NITROSTAT) 0.4 MG SL tablet Place 1 tablet (0.4 mg total) under the tongue every 5 (five) minutes x 3 doses as needed for chest pain. 08/03/20  Yes Isaiah Serge, NP  Potassium Chloride ER 20 MEQ TBCR Take 20 mEq by mouth daily. ON DAYS YOU TAKE METOLAZONE TAKE AN EXTRA TABLET (10mq TOTAL) 11/23/20  Yes [name], [name], MD  torsemide (DEMADEX) 20 MG tablet Take 1 tablet (20 mg total) by mouth daily. 01/10/21  Yes [name], [name], MD    Inpatient Medications: Scheduled Meds:  apixaban  5 mg Oral BID   levothyroxine  25 mcg Oral Q0600   potassium chloride  20 mEq Oral Daily   sodium chloride flush  3 mL Intravenous Q12H   torsemide  20 mg Oral Daily   Continuous Infusions:  sodium chloride     diltiazem (CARDIZEM) infusion 8 mg/hr (03/19/21 0540)   PRN Meds: sodium chloride, acetaminophen **OR** acetaminophen, nitroGLYCERIN, senna-docusate, sodium chloride flush  Allergies:    Allergies  Allergen Reactions   Amiodarone Rash    Social History:   Social History   Socioeconomic History   Marital status: Married    Spouse name: Not on file   Number of children: 0   Years of education: Not on file   Highest education level: Not on file  Occupational History   Not on file  Tobacco Use   Smoking status: Never   Smokeless tobacco: Never  Vaping Use   Vaping Use: Never used  Substance and Sexual Activity   Alcohol use: Yes    Alcohol/week: 0.0 standard drinks    Comment: occassional wine, martini   Drug use: No   Sexual activity: Not on file  Other Topics Concern   Not on file  Social History Narrative   Not on file   Social Determinants of Health   Financial Resource Strain: Not on file  Food Insecurity: Not on file  Transportation Needs: Not on file  Physical Activity: Not on file  Stress: Not on file  Social Connections: Not on file  Intimate Partner Violence: Not on file    Family History:   Family  History  Problem Relation Age of Onset   Colon cancer Neg Hx    Esophageal cancer Neg Hx    Pancreatic cancer Neg Hx    Prostate cancer Neg Hx    Rectal cancer Neg Hx    Stomach cancer Neg Hx    Family Status:  Family Status  Relation Name Status   Father  Deceased   Mother  Deceased   Neg Hx  (Not Specified)    ROS:  Please see the history of present illness.  All other ROS reviewed and negative.     Physical  Exam/Data:   Vitals:   03/18/21 2332 03/19/21 0230 03/19/21 0541 03/19/21 0700  BP: 127/89 136/68 110/89 118/74  Pulse: (!) 152 79 (!) 55 98  Resp: (!) '28 15 16 18  '$ Temp: 98.7 F (37.1 C)     TempSrc: Oral     SpO2: 94% 91% 94% 92%  Weight:      Height:       No intake or output data in the 24 hours ending 03/19/21 0749 Filed Weights   03/18/21 1515  Weight: 117.9 kg   Body mass index is 39.53 kg/m.   General: Obese, NAD Neck: Negative for carotid bruits. No JVD Lungs:Clear to ausculation bilaterally. Breathing is unlabored. Cardiovascular: RRR with S1 S2. No murmurs Abdomen: Soft, non-tender, distended. No obvious abdominal masses. Extremities: L>R 2+ edema. Radial pulses 2+ bilaterally Neuro: Alert and oriented. No focal deficits. No facial asymmetry. MAE spontaneously. Psych: Responds to questions appropriately with normal affect.     EKG:  The EKG was personally reviewed and demonstrates:  03/18/21 Atrial fibrillation with RVR, HR 156bpm, RBBB (chronic)  Telemetry:  Telemetry was personally reviewed and demonstrates: 03/18/21 NSR with HR in the 70's   Relevant CV Studies:  TEE: 07/13/2020 1. TEE with no RAA/LAA thrombus DCC x 1 with 200J converted from atrial  flutter rate 133 bpm to NSR rate 88 bpm On eliquis with no immediate  neurologic sequelae.   2. EF decreased with patient in rapid atrial flutter rate 133. Left  ventricular ejection fraction, by estimation, is 45 to 50%. The left  ventricle has mildly decreased function. There is mild  left ventricular  hypertrophy.   3. Right ventricular systolic function is severely reduced. The right  ventricular size is severely enlarged.   4. Left atrial size was moderately dilated. No left atrial/left atrial  appendage thrombus was detected.   5. Right atrial size was severely dilated.   6. The mitral valve is normal in structure. Mild mitral valve  regurgitation.   7. Tricuspid valve regurgitation is moderate.   8. The aortic valve is tricuspid. Aortic valve regurgitation is mild.  Mild aortic valve sclerosis is present, with no evidence of aortic valve  stenosis.    ECHO: 07/11/2020  1. Left ventricular ejection fraction, by estimation, is 50 to 55%. The  left ventricle has low normal function. The left ventricle has no regional  wall motion abnormalities. There is moderate left ventricular hypertrophy.  Left ventricular diastolic  parameters are indeterminate.   2. Right ventricular systolic function is mildly reduced. The right  ventricular size is mildly enlarged. There is mildly elevated pulmonary  artery systolic pressure. The estimated right ventricular systolic  pressure is [id number] mmHg.   3. Right atrial size was mildly dilated.   4. The mitral valve is normal in structure. Trivial mitral valve  regurgitation.   5. The aortic valve was not well visualized. Aortic valve regurgitation  is not visualized. No aortic stenosis is present.   6. Aortic dilatation noted. There is mild dilatation of the ascending  aorta, measuring 38 mm.   7. The inferior vena cava is dilated in size with <50% respiratory  variability, suggesting right atrial pressure of 15 mmHg.   Laboratory Data:  Chemistry Recent Labs  Lab 03/18/21 1531 03/19/21 0500  NA 139 139  K 4.3 4.6  CL 98 97*  CO2 32 30  GLUCOSE 140* 125*  BUN 17 16  CREATININE 1.17 1.16  CALCIUM 9.5 9.5  GFRNONAA >60 >60  ANIONGAP 9 12    Total Protein  Date Value Ref Range Status  09/24/2020 7.1 6.5 - 8.1 g/dL  Final   Albumin  Date Value Ref Range Status  09/24/2020 3.3 (L) 3.5 - 5.0 g/dL Final   AST  Date Value Ref Range Status  09/24/2020 30 15 - 41 U/L Final   ALT  Date Value Ref Range Status  09/24/2020 18 0 - 44 U/L Final   Alkaline Phosphatase  Date Value Ref Range Status  09/24/2020 104 38 - 126 U/L Final   Total Bilirubin  Date Value Ref Range Status  09/24/2020 0.4 0.3 - 1.2 mg/dL Final   Hematology Recent Labs  Lab 03/18/21 1531  WBC 8.3  RBC 5.11  HGB 15.3  HCT 49.5  MCV 96.9  MCH 29.9  MCHC 30.9  RDW 14.2  PLT 199   Cardiac EnzymesNo results for input(s): TROPONINI in the last 168 hours. No results for input(s): TROPIPOC in the last 168 hours.  BNP Recent Labs  Lab 03/18/21 2134  BNP 404.6*    DDimer No results for input(s): DDIMER in the last 168 hours. TSH:  Lab Results  Component Value Date   TSH 6.379 (H) 03/18/2021   Lipids: Lab Results  Component Value Date   CHOL 103 05/08/2020   HDL 27 (L) 05/08/2020   LDLCALC 62 05/08/2020   TRIG 71 05/08/2020   CHOLHDL 3.8 05/08/2020   HgbA1c: Lab Results  Component Value Date   HGBA1C 5.8 (H) 05/08/2020    Radiology/Studies:  DG Chest 2 View  Result Date: 03/18/2021 CLINICAL DATA:  Tachycardia EXAM: CHEST - 2 VIEW COMPARISON:  Chest radiograph 09/24/2020, CT chest 07/14/2020 FINDINGS: The heart is mildly enlarged, unchanged. The mediastinal contours are stable. There is vascular congestion without overt pulmonary edema. There is a trace left pleural effusion with adjacent airspace disease likely reflecting subsegmental atelectasis. There is no other focal consolidation. There is no right pleural effusion. There is no pneumothorax. There is no acute osseous abnormality. IMPRESSION: 1. Trace left pleural effusion with adjacent airspace disease favored to reflect subsegmental atelectasis. 2. Stable mild cardiomegaly.  No overt pulmonary edema. Electronically Signed   By: Valetta Mole M.D.   On:  03/18/2021 16:00   CT Angio Chest PE W and/or Wo Contrast  Result Date: 03/18/2021 CLINICAL DATA:  Tachycardia for 2 days. Atrial flutter. PE suspected, high prob. EXAM: CT ANGIOGRAPHY CHEST WITH CONTRAST TECHNIQUE: Multidetector CT imaging of the chest was performed using the standard protocol during bolus administration of intravenous contrast. Multiplanar CT image reconstructions and MIPs were obtained to evaluate the vascular anatomy. CONTRAST:  133m OMNIPAQUE IOHEXOL 350 MG/ML SOLN COMPARISON:  Chest radiograph from earlier today. 07/14/2020 chest CT angiogram. FINDINGS: Cardiovascular: The study is high quality for the evaluation of pulmonary embolism. There are no filling defects in the central, lobar, segmental or subsegmental pulmonary artery branches to suggest acute pulmonary embolism. Atherosclerotic thoracic aorta with mildly dilated 4.1 cm ascending thoracic aorta, stable. Stable dilated main pulmonary artery (3.9 cm diameter). Stable mild cardiomegaly. No significant pericardial fluid/thickening. Left anterior descending and left circumflex coronary atherosclerosis. Mediastinum/Nodes: No discrete thyroid nodules. Unremarkable esophagus. No pathologically enlarged axillary, mediastinal or hilar lymph nodes. Stable small amount of fluid in the right lower posterior mediastinum probably trapped within a fat containing hiatal hernia sac. Lungs/Pleura: No pneumothorax. No pleural effusion. No acute consolidative airspace disease, lung masses or significant pulmonary nodules. Chronic diffuse bronchial wall thickening. Upper abdomen: Simple 2.2 cm  lateral upper left renal cyst. Musculoskeletal: No aggressive appearing focal osseous lesions. Marked thoracic spondylosis. Poorly marginated soft tissue lesions in posterior chest wall bilaterally, right greater than left, located between the inferior scapula and posterolateral ribs, largest 6.2 x 3.3 cm on the right (series 6/image 85), not appreciably  changed since 07/14/2020 CT. Review of the MIP images confirms the above findings. IMPRESSION: 1. No pulmonary embolism. 2. Stable mild cardiomegaly. 3. Stable dilated main pulmonary artery, suggesting chronic pulmonary arterial hypertension. 4. Two-vessel coronary atherosclerosis. 5. Chronic diffuse bronchial wall thickening, as can be seen with chronic bronchitis or reactive airways disease. 6. Poorly marginated soft tissue lesions in the posterior chest wall bilaterally, right greater than left, not appreciably changed since 07/14/2020 CT. Findings are nonspecific, with the location most typical of elastofibroma dorsi. Suggest correlation with any clinical symptoms referable to this location. Consider further characterization with MRI chest without and with IV contrast, versus chest CT with IV contrast follow-up in 6 months, as clinically warranted. 7. Aortic Atherosclerosis (ICD10-I70.0). Electronically Signed   By: Ilona Sorrel M.D.   On: 03/18/2021 18:10    Assessment and Plan:   1. Paroxysmal atrial fibrillation with RVR: -Pt with a known hx of AF with RVR previously treated with multiple DCCV therapies and amiodarone. He recently self discontinued amiodarone several months ago after developing a rash however rash returned despite abstinence. He was left off AAD and remained in NSR for several months. Two days ago, he noted an elevated HR on pulse oximeter with rates in the 150 range. On ED presentation, EKG/telemetry showed AF with RVR. He was placed on IV diltiazem and has since spontaneously converted to NSR with stable rates in the 70's -PTA medications include Eliquis '5mg'$  BID. He appears to not be on any rate controlling agents -Would plan to follow HR on telemetry and convert diltiazem to PO form  -Currently on '8mg'$ /hr IV diltiazem>>will start PO dosing at '180mg'$  QD  -Continue Eliquis '5mg'$  PO BID  -Echocardiogram pending   2. Elevated TSH: -TSH found to be elevated at 6.379 when previously  normal levels per lab review. Was 3.690 on 05/08/2020 -Started on low dose synthroid per IM  -Will need close follow up with PCP for repeat labs in 6 weeks. Would also consider obtaining T3/T4  3. Hx of DVT: -Hx of right distal femoral and popliteal DVT in 2015 and was started on Eliquis>>followed with [facility] for this. Repeat US showed partial resolution however chronically occluded tibioperoneal venous trunk. He had lots of LE pain related to varicose veins and underwent venous vein ablation and laser ablation/stab phlebotomies of the left great saphenous vein/left leg 06/2015.  -Has chronic, stable L>R BLE  -Continue with leg elevation and compression stockings  4. Chronic diastolic CHF/RV failure: -Felt to mostly be secondary to untreated OSA as patient has refused sleep study on several occasions per chart review -Only has a hx of sodium non-compliance -Dry goal weight appears to be approximately 260lb -Does not appear to be overtly volume up today  -Continue home torsemide '40mg'$  QD   5. OSA>>not on CPAP: -Previously ordered OP>>patient refused  -Emphasized need to complete this  6. Obesity: -BMI, 39.53 -Sedentary lifestyle>>encouraged increased OP mobility   For questions or updates, please contact [place] Please consult [email or web address] for contact info under Cardiology/STEMI.   SignedKathyrn Drown NP-C [facility] Pager: [phone] 03/19/2021 7:49 AM As above, patient seen and examined.  Briefly he is a 75 year old male with past medical history of  chronic diastolic congestive heart failure, paroxysmal atrial fibrillation/flutter with multiple cardioversions, recurrent DVTs, hypertension, hyperlipidemia, obstructive sleep apnea, obesity for evaluation of recurrent atrial flutter.  Last echocardiogram December 2021 showed ejection fraction 50 to 55%, moderate left ventricular hypertrophy, mild RV dysfunction with mild right ventricular enlargement, mild right atrial enlargement.   Patient discontinued his amiodarone several months ago due to rash.  He was checking his heart rate recently and noted to be 150.  He was told to come to the emergency room and was found to be in atrial flutter and cardiology asked to evaluate.  Otherwise he has no symptoms including no dyspnea, chest pain, palpitations or syncope.  No recent bleeding. Initial electrocardiogram showed atrial flutter with rapid ventricular response.  Troponins are 28, 28, 28 and 32.  BNP 404.  1 paroxysmal atrial fibrillation/flutter-patient presented with atrial flutter with rapid ventricular response.  He was placed on Cardizem and converted to sinus rhythm.  We will change Cardizem to CD1 180 mg daily for rate control if atrial fibrillation or flutter recurs.  Continue apixaban.  Patient discontinued his amiodarone previously due to rash.  Resuming this could be considered to maintain sinus rhythm.  Alternatively could consider Tikosyn.  He is scheduled to follow-up with Dr. Sallyanne Kuster at the end of August.  2 chronic diastolic congestive heart failure-he is euvolemic on examination.  Continue diuretics at present dose.  3 history of recurrent DVT-continue apixaban.  4 history of obstructive sleep apnea-apparently he has declined CPAP in the past.  Readdress as an outpatient.  Patient can be discharged from a cardiac standpoint.  Please call with questions. Kirk Ruths, MD

## 2021-03-19 NOTE — ED Notes (Signed)
Pt wheeled to waiting room. Pt verbalized understanding of discharge instructions.   

## 2021-03-19 NOTE — Progress Notes (Signed)
  Echocardiogram 2D Echocardiogram has been performed.  Shane Diaz 03/19/2021, 2:22 PM

## 2021-03-21 ENCOUNTER — Other Ambulatory Visit: Payer: Self-pay

## 2021-03-21 ENCOUNTER — Encounter: Payer: Self-pay | Admitting: Cardiovascular Disease

## 2021-03-21 ENCOUNTER — Ambulatory Visit (INDEPENDENT_AMBULATORY_CARE_PROVIDER_SITE_OTHER): Payer: Medicare Other | Admitting: Cardiovascular Disease

## 2021-03-21 VITALS — BP 136/80 | HR 75 | Resp 20 | Ht 67.0 in | Wt 301.0 lb

## 2021-03-21 DIAGNOSIS — I480 Paroxysmal atrial fibrillation: Secondary | ICD-10-CM | POA: Diagnosis not present

## 2021-03-21 DIAGNOSIS — I82541 Chronic embolism and thrombosis of right tibial vein: Secondary | ICD-10-CM

## 2021-03-21 DIAGNOSIS — I2781 Cor pulmonale (chronic): Secondary | ICD-10-CM | POA: Diagnosis not present

## 2021-03-21 DIAGNOSIS — I5032 Chronic diastolic (congestive) heart failure: Secondary | ICD-10-CM

## 2021-03-21 DIAGNOSIS — Z7901 Long term (current) use of anticoagulants: Secondary | ICD-10-CM

## 2021-03-21 DIAGNOSIS — I4819 Other persistent atrial fibrillation: Secondary | ICD-10-CM

## 2021-03-21 DIAGNOSIS — G4730 Sleep apnea, unspecified: Secondary | ICD-10-CM

## 2021-03-21 MED ORDER — TORSEMIDE 20 MG PO TABS: 20.0000 mg | ORAL_TABLET | Freq: Every day | ORAL | 3 refills | Status: DC

## 2021-03-21 NOTE — Progress Notes (Addendum)
Cardiology new patient note:    Date:  03/29/2021   ID:  Shane Diaz, DOB [birth year], MRN [id number]  PCP:  [name], MD  Cardiologist:  Sanda Klein, MD New Electrophysiologist:  None   Referring MD: [name], MD   No chief complaint on file.   History of Present Illness:    Shane Diaz is a 75 y.o. 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    Diuresis has been much more effective with torsemide rather than furosemide and metolazone.  He lost down to a minimum of 280 pounds.  We have previously estimated his dry weight to be around 260 pounds.  In the 2 months since his last appointment he has gained back 15 pounds and now weighs 301 pounds.  The edema in his lower extremities is again worsening and he is beginning to develop some weeping wounds again.  He sleeps in a recliner and denies orthopnea or PND.  He is a loud snorer and has severe daytime hypersomnolence.  Compliance with sodium restriction remains poor, despite his wife's best efforts..  He had 2 days of tachycardia detected on his home pulse oximeter with rates around 158 bpm, was briefly hospitalized with typical atrial flutter, but without any subjective palpitations or worsening of his overall clinical status, other than mild chest pressure that occurred off and on during those 2 days.  His blood pressure remained normal around 120/70.  No rash currently.  He also continues to be reluctant to commit to a sleep study, although I have told him repeatedly that I think the underlying cause for his right heart failure (and  the worst factor in his prognosis) remains untreated sleep apnea.  His most recent labs show a borderline hemoglobin A1c of 5.8% with a fairly satisfactory cholesterol profile (very low HDL but also low total and LDL cholesterol).  He had Covid pneumonia requiring hospitalization in October 2021.  He was hospitalized with hypoxemia due to decompensated congestive heart failure and atrial flutter with rapid ventricular response in early December 2021.   On transthoracic echocardiogram LV EF was 50-55%, but the right ventricle was mildly dilated and mildly depressed.  TEE showed LVEF 45-50% and a severely enlarged and depressed right ventricle with moderate tricuspid regurgitation. He underwent TEE guided cardioversion successfully on 07/13/2020 and discharged on beta-blockers and anticoagulants, but returned and was hospitalized again with similar problems on 08/01/2020.  An attempt at cardioversion on 08/02/2020 was not successful. IV Amiodarone was initiated.  Cardioversion was tried again the next day, again unsuccessfully.  The plan was to continue loading with amiodarone and bring back for cardioversion.  He was seen back in clinic in mid January and declined repeat cardioversion.  He was again hospitalized in February for volume overload complicated by cellulitis of the lower extremities, sepsis, atrial fibrillation rapid ventricular response.  He required fluid resuscitation with worsening edema.  The arrhythmia was managed conservatively and his volume status improved after intravenous diuretics.  At hospital discharge his dry weight was estimated to be 270 pounds.  At that weight, his wife reports that his legs were thin and that he no longer had edema or  blisters or skin wounds.  Once the patient returned home, he returned to his old habits of adding salt to his food, eating a lot of processed meats and pickles.  He spends the day sitting in a chair at the computer and has developed gradually  worsening lower extremity edema.  His weight has gone up 16 pounds and he has developed a new weeping wound on his right leg and blisters on the back of his left calf.  He has been unable to wear compression stockings and does not like to keep his legs elevated.  He also developed a "lobster red" pruritic rash on his neck and shoulders and lower limbs.  He stopped taking amiodarone 4 days before this appointment and the rash is beginning to improve.  It is no longer as pruritic.  He has been taking Zyrtec.  He is on a relatively short list of medications, the only relatively new one is indeed amiodarone   In 2016, years ago he had a right distal femoral and popliteal DVT, partially resolved by follow-up ultrasound in late 2016 but with chronic occlusion of the right tibioperoneal venous trunk.  About 3 years ago he was having a lot of lower extremity pain related to large varicose veins and underwent venous ablation in his right lower extremity.  He had laser ablation of the left greater saphenous vein and stab phlebectomies of the left leg in November 2016  Starting in March 2020 he had gradually worsening pain and swelling in the left calf, more so than on the right.  In August of this year he developed severe swelling and discomfort in his left calf and was diagnosed with a DVT and started on Xarelto.  Switched to Eliquis later because of suspected allergy (I suspect this was actually only normal skin changes related to brawny edema).   He has a follow-up visit and ultrasound scheduled with Dr. Scot Dock on March 11.  He denies angina or dyspnea at rest or with his usual activity (he is quite sedentary).  He has not had focal neurological events, syncope, palpitations or serious bleeding problems.  He canceled his sleep study.  It sounds like he would not use CPAP even if we did confirm the diagnosis.  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  He has been living in [place] for the last 10 years or so.  Past Medical History:  Diagnosis Date   Acute diastolic heart failure ([place]) 08/03/2020   Arthritis    Diverticulosis 05/2015   Mild, noted on colonosocpy   Fatty liver 05/14/2015   noted on [place] ABD   History of colon polyps 05/2014   sessile in ascending colon, pedunculated in sigmoid colon, diverticulosis   Hyperlipidemia    pt denies   Hypertension    not currently taking medications per MD   OSA (obstructive sleep apnea) 08/03/2020   Renal cyst 05/2014   Small, Left, noted on [place] ABD   Umbilical hernia    Varicose veins     Past Surgical History:  Procedure Laterality Date   APPENDECTOMY     childhood   BIOPSY  07/11/2020   Procedure: BIOPSY;  Surgeon: Ladene Artist, MD;  Location: [place];  Service: Endoscopy;;   CARDIOVERSION N/A 07/13/2020   Procedure: CARDIOVERSION;  Surgeon: Josue Hector, MD;  Location: [facility] END[facility]COPY;  Service: Cardiovascular;  Laterality: N/A;   CARDIOVERSION N/A 08/02/2020   Procedure: CARDIOVERSION;  Surgeon: Werner Lean, MD;  Location: [facility];  Service: Cardiovascular;  Laterality: N/A;   COLONOSCOPY W/ POLYPECTOMY  05/23/2014   COLONOSCOPY WITH PROPOFOL N/A 07/11/2020   Procedure: COLONOSCOPY WITH PROPOFOL;  Surgeon: Ladene Artist, MD;  Location: [facility] ENDOSCOPY;  Service: Endoscopy;  Laterality: N/A;   ESOPHAGOGASTRODUODENOSCOPY (EGD) WITH PROPOFOL N/A 07/11/2020   Procedure: ESOPHAGOGASTRODUODENOSCOPY (EGD) WITH PROPOFOL;  Surgeon: Ladene Artist, MD;  Location: [facility] ENDOSCOPY;  Service: Endoscopy;  Laterality: N/A;   TEE WITHOUT CARDIOVERSION N/A 07/13/2020   Procedure: TRANSESOPHAGEAL ECHOCARDIOGRAM (TEE);  Surgeon: Josue Hector, MD;  Location: [facility] END[facility]COPY;  Service: Cardiovascular;  Laterality: N/A;   UMBILICAL HERNIA REPAIR N/A 04/27/2018   Procedure: REPAIR UMBILICAL  HERNIA ERAS PATHWAY;  Surgeon: Alphonsa Overall, MD;  Location: [facility] ORS;  Service: General;  Laterality: N/A;   varicose veins     WRIST SURGERY      Current Medications: Current Meds  Medication Sig   apixaban (ELIQUIS) 5 MG TABS tablet Take 1 tablet (5 mg total) by mouth 2 (two) times daily.   diltiazem (CARDIZEM CD) 180 MG 24 hr capsule Take 1 capsule (180 mg total) by mouth daily.   levothyroxine (SYNTHROID) 25 MCG tablet Take 25 mcg by mouth daily before breakfast.   Magnesium Oxide 400 MG CAPS Take 1 capsule (400 mg total) by mouth daily.   nitroGLYCERIN (NITROSTAT) 0.4 MG SL tablet Place 1 tablet (0.4 mg total) under the tongue every 5 (five) minutes x 3 doses as needed for chest pain.   Potassium Chloride ER 20 MEQ TBCR Take 20 mEq by mouth daily. ON DAYS YOU TAKE METOLAZONE TAKE AN EXTRA TABLET (67mq TOTAL)     Allergies:   Amiodarone   Social History   Socioeconomic History   Marital status: Married    Spouse name: Not on file   Number of children: 0   Years of education: Not on file   Highest education level: Not on file  Occupational History   Not on file  Tobacco Use   Smoking status: Never   Smokeless tobacco: Never  Vaping Use   Vaping Use: Never used  Substance and Sexual Activity   Alcohol use: Yes    Alcohol/week: 0.0 standard drinks    Comment: occassional wine, martini   Drug use: No   Sexual activity: Not on file  Other Topics Concern   Not on file  Social History Narrative   Not on file   Social Determinants of Health   Financial Resource Strain: Not on file  Food Insecurity: Not on file  Transportation Needs: Not on file  Physical Activity: Not on file  Stress: Not on file  Social Connections: Not on file     Family History: The patient's family history is negative for Colon cancer, Esophageal cancer, Pancreatic cancer, Prostate cancer, Rectal cancer, and Stomach cancer.  ROS:   Please see the history of present illness.    All other  systems are reviewed and are negative.   EKGs/Labs/Other Studies Reviewed:    The following studies were reviewed today: Extensive review of notes from his 3 hospitalizations in the last 3 months. Echocardiogram 03/19/2021  Study Result     1. Left ventricular ejection fraction, by estimation, is 55%. The left  ventricle has normal function. The left ventricle has no regional wall  motion abnormalities. There is severe left ventricular hypertrophy. Left  ventricular diastolic parameters are  indeterminate.   2. Right ventricular systolic function is normal. The right ventricular  size is normal.   3. Left atrial size was moderately dilated.   4. The mitral valve is abnormal. Trivial mitral valve regurgitation. No  evidence of mitral stenosis.   5. The aortic valve is tricuspid. Aortic valve regurgitation is not  visualized. Mild to moderate aortic valve sclerosis/calcification is  present, without any evidence of aortic stenosis.   6. The inferior vena cava is normal in size with greater than 50%  respiratory variability, suggesting right atrial pressure of 3 mmHg.     Transesophageal echocardiogram 07/13/2020   1. TEE with no RAA/LAA thrombus DCC x 1 with 200J converted from atrial  flutter rate 133 bpm to NSR rate 88 bpm On eliquis with no immediate  neurologic sequelae.   2. EF decreased with patient in rapid atrial flutter rate 133. Left  ventricular ejection fraction, by estimation, is 45 to 50%. The left  ventricle has mildly decreased function. There is mild left ventricular  hypertrophy.   3. Right ventricular systolic function is severely reduced. The right  ventricular size is severely enlarged.   4. Left atrial size was moderately dilated. No left atrial/left atrial  appendage thrombus was detected.   5. Right atrial size was severely dilated.   6. The mitral valve is normal in structure. Mild mitral valve  regurgitation.   7. Tricuspid valve regurgitation is  moderate.   8. The aortic valve is tricuspid. Aortic valve regurgitation is mild.  Mild aortic valve sclerosis is present, with no evidence of aortic valve  stenosis.   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  Very similar to previous tracings show sinus rhythm with right bundle branch block and slightly prolonged QTC at 469 ms  Recent Labs: 09/24/2020: ALT 18 10/02/2020: Magnesium 1.9 03/18/2021: B Natriuretic Peptide 404.6; Hemoglobin 15.3; Platelets 199; TSH 6.379 03/19/2021: BUN 16; Creatinine, Ser 1.16; Potassium 4.6; Sodium 139       Component Value Date/Time   CHOL 103 05/08/2020 0335   TRIG 71 05/08/2020 0335   HDL 27 (L) 05/08/2020 0335   CHOLHDL 3.8 05/08/2020 0335   VLDL 14 05/08/2020 0335   LDLCALC 62 05/08/2020 0335    Physical Exam:    VS:  BP 136/80   Pulse 75   Resp 20   Ht '5\' 7"'$  (1.702 m)   Wt (!) 301 lb (136.5 kg)   SpO2 94%   BMI 47.14 kg/m     Wt Readings from Last 3 Encounters:  03/21/21 (!) 301 lb (136.5 kg)  03/18/21 260 lb (117.9 kg)  01/10/21 286 lb (129.7 kg)     General: Alert, oriented x3, no distress, overly obese Head: no evidence of trauma, PERRL, EOMI, no exophtalmos or lid lag, no myxedema, no xanthelasma; normal ears, nose and oropharynx Neck: normal jugular venous pulsations and no hepatojugular reflux; brisk carotid pulses without delay and no carotid bruits Chest: clear to auscultation, no signs of consolidation by percussion or palpation, normal fremitus, symmetrical and full respiratory excursions Cardiovascular: normal position and quality of the apical impulse, regular rhythm, normal first and widely split second heart sounds, no murmurs, rubs or gallops Abdomen: no tenderness or distention, no masses by palpation, no abnormal pulsatility or arterial bruits, normal bowel sounds, no hepatosplenomegaly Extremities: no clubbing, cyanosis; bilateral 4+ pitting edema; 2+ radial, ulnar and  brachial  pulses bilaterally; 2+ right femoral, posterior tibial and dorsalis pedis pulses; 2+ left femoral, posterior tibial and dorsalis pedis pulses; no subclavian or femoral bruits Neurological: grossly nonfocal Psych: Normal mood and affect    ASSESSMENT:    1. Chronic heart failure with preserved ejection fraction (HFpEF) (HCC)   2. Paroxysmal atrial fibrillation ([place])   3. Sleep apnea, unspecified type   4. Cor pulmonale, chronic ([employer])   5. 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 Long term (current) use of anticoagulants      PLAN:    In order of problems listed above:  CHF: He has almost exclusively if not entirely right heart failure due to cor pulmonale. Probably 40 lb above dry weight, but need to get him down to 280 lb to prevent most serious complications of edema. Did much better with torsemide compared with furosemide.   Metolazone did not help.    We will try increasing the torsemide to 40 mg once daily on days with weight >280 AFib/typical atrial flutter: Probably had a 24-48 h episode recently, not particularly symptomatic. Compliant with Eliquis, currently maintaining normal rhythm without antiarrhythmics and even without beta-blockers.  Amiodarone was thought to be the cause of his rash, but the rash resolved and recurred and now persists long after the medication was stopped.  If atrial fibrillation becomes a frequent problem, might want to try dofetilide before we commit to amiodarone again. 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  Repeated to him that it is critical that he reduce the swelling in his limbs to avoid recurrent problems with skin breakdown, cellulitis, sepsis.  Recurrent skin infection with further worsen the edema due to compromise of the lymphatic system.  Needs to keep his legs elevated throughout the day.  Sitting at the computer desk with legs  dangling is very much counterproductive. Obesity: He has remains unwilling to participate in regular physical activity (with his current deconditioning and chronic respiratory problems I do not know that any program of exercise would be successful).  He really needs to reduce calorie intake. Pre-DM: Borderline glucose levels, generally favorable lipid profile except for very low HDL cholesterol. OSA:He was more receptive today, to the need to diagnose and treat this. Schedule sleep study. Rash: Etiology uncertain.    Medication Adjustments/Labs and Tests Ordered: Current medicines are reviewed at length with the patient today.  Concerns regarding medicines are outlined above.  Orders Placed This Encounter  Procedures   Split night study     Meds ordered this encounter  Medications   torsemide (DEMADEX) 20 MG tablet    Sig: Take 1 tablet (20 mg total) by mouth daily. Take 40 mg for a weight over 280 lbs    Dispense:  40 tablet    Refill:  3      Patient Instructions  Medication Instructions:  TORSEMIDE: take 20 mg daily. For a weight over 280 pounds take 40 mg once daily DILTIAZEM: you may take an extra tablet for a heart rate over 130  *If you need a refill on your cardiac medications before your next appointment, please call your pharmacy*   Lab Work: None ordered If you have labs (blood work) drawn today and your tests are completely normal, you will receive your results only by: [place] (if you have MyChart) OR A paper copy in the mail If you have any lab test that is abnormal  or we need to change your treatment, we will call you to review the results.   Testing/Procedures: Your physician has recommended that you have a sleep study. This test records several body functions during sleep, including: brain activity, eye movement, oxygen and carbon dioxide blood levels, heart rate and rhythm, breathing rate and rhythm, the flow of air through your mouth and nose,  snoring, body muscle movements, and chest and belly movement.  Follow-Up: At [facility], you and your health needs are our priority.  As part of our continuing mission to provide you with exceptional heart care, we have created designated Provider Care Teams.  These Care Teams include your primary Cardiologist (physician) and Advanced Practice Providers (APPs -  Physician Assistants and Nurse Practitioners) who all work together to provide you with the care you need, when you need it.  We recommend signing up for the patient portal called "MyChart".  Sign up information is provided on this After Visit Summary.  MyChart is used to connect with patients for Virtual Visits (Telemedicine).  Patients are able to view lab/test results, encounter notes, upcoming appointments, etc.  Non-urgent messages can be sent to your provider as well.   To learn more about what you can do with MyChart, go to [email or web address].    Your next appointment:   Keep your follow up on 9/28   Signed, Sanda Klein, MD  03/29/2021 2:30 PM    [place]

## 2021-03-21 NOTE — Patient Instructions (Addendum)
Medication Instructions:  TORSEMIDE: take 20 mg daily. For a weight over 280 pounds take 40 mg once daily DILTIAZEM: you may take an extra tablet for a heart rate over 130  *If you need a refill on your cardiac medications before your next appointment, please call your pharmacy*   Lab Work: None ordered If you have labs (blood work) drawn today and your tests are completely normal, you will receive your results only by: [place] (if you have MyChart) OR A paper copy in the mail If you have any lab test that is abnormal or we need to change your treatment, we will call you to review the results.   Testing/Procedures: Your physician has recommended that you have a sleep study. This test records several body functions during sleep, including: brain activity, eye movement, oxygen and carbon dioxide blood levels, heart rate and rhythm, breathing rate and rhythm, the flow of air through your mouth and nose, snoring, body muscle movements, and chest and belly movement.  Follow-Up: At [facility], you and your health needs are our priority.  As part of our continuing mission to provide you with exceptional heart care, we have created designated Provider Care Teams.  These Care Teams include your primary Cardiologist (physician) and Advanced Practice Providers (APPs -  Physician Assistants and Nurse Practitioners) who all work together to provide you with the care you need, when you need it.  We recommend signing up for the patient portal called "MyChart".  Sign up information is provided on this After Visit Summary.  MyChart is used to connect with patients for Virtual Visits (Telemedicine).  Patients are able to view lab/test results, encounter notes, upcoming appointments, etc.  Non-urgent messages can be sent to your provider as well.   To learn more about what you can do with MyChart, go to [email or web address].    Your next appointment:   Keep your follow up on 9/28

## 2021-05-01 ENCOUNTER — Ambulatory Visit: Payer: Medicare Other | Admitting: Cardiovascular Disease

## 2021-05-08 ENCOUNTER — Ambulatory Visit: Payer: Medicare Other | Admitting: Cardiovascular Disease

## 2021-05-28 ENCOUNTER — Encounter (HOSPITAL_BASED_OUTPATIENT_CLINIC_OR_DEPARTMENT_OTHER): Payer: Medicare Other | Admitting: Cardiovascular Disease

## 2021-06-04 ENCOUNTER — Other Ambulatory Visit: Payer: Self-pay

## 2021-06-04 ENCOUNTER — Encounter: Payer: Self-pay | Admitting: Cardiovascular Disease

## 2021-06-04 ENCOUNTER — Ambulatory Visit (INDEPENDENT_AMBULATORY_CARE_PROVIDER_SITE_OTHER): Payer: Medicare Other | Admitting: Cardiovascular Disease

## 2021-06-04 VITALS — BP 128/70 | HR 87 | Ht 67.0 in | Wt 319.0 lb

## 2021-06-04 DIAGNOSIS — I480 Paroxysmal atrial fibrillation: Secondary | ICD-10-CM

## 2021-06-04 DIAGNOSIS — I87009 Postthrombotic syndrome without complications of unspecified extremity: Secondary | ICD-10-CM | POA: Diagnosis not present

## 2021-06-04 DIAGNOSIS — I5032 Chronic diastolic (congestive) heart failure: Secondary | ICD-10-CM | POA: Diagnosis not present

## 2021-06-04 DIAGNOSIS — L298 Other pruritus: Secondary | ICD-10-CM

## 2021-06-04 DIAGNOSIS — R7303 Prediabetes: Secondary | ICD-10-CM

## 2021-06-04 DIAGNOSIS — G4733 Obstructive sleep apnea (adult) (pediatric): Secondary | ICD-10-CM

## 2021-06-04 MED ORDER — BUMETANIDE 2 MG PO TABS: 2.0000 mg | ORAL_TABLET | Freq: Every day | ORAL | 1 refills | Status: DC

## 2021-06-04 MED ORDER — METOPROLOL SUCCINATE ER 50 MG PO TB24: 50.0000 mg | ORAL_TABLET | Freq: Every day | ORAL | 3 refills | Status: DC

## 2021-06-04 NOTE — Patient Instructions (Signed)
Medication Instructions:  STOP the Diltiazem STOP the Torsemide  START Metoprolol Succinate 50 mg once daily START Bumex 2 mg once daily  *If you need a refill on your cardiac medications before your next appointment, please call your pharmacy*   Lab Work: None ordered If you have labs (blood work) drawn today and your tests are completely normal, you will receive your results only by: [place] (if you have MyChart) OR A paper copy in the mail If you have any lab test that is abnormal or we need to change your treatment, we will call you to review the results.   Testing/Procedures: None ordered   Follow-Up: At [facility], you and your health needs are our priority.  As part of our continuing mission to provide you with exceptional heart care, we have created designated Provider Care Teams.  These Care Teams include your primary Cardiologist (physician) and Advanced Practice Providers (APPs -  Physician Assistants and Nurse Practitioners) who all work together to provide you with the care you need, when you need it.  We recommend signing up for the patient portal called "MyChart".  Sign up information is provided on this After Visit Summary.  MyChart is used to connect with patients for Virtual Visits (Telemedicine).  Patients are able to view lab/test results, encounter notes, upcoming appointments, etc.  Non-urgent messages can be sent to your provider as well.   To learn more about what you can do with MyChart, go to [email or web address].    Your next appointment:   Keep your follow up with Dr. Sallyanne Kuster on 11/15 at 10:20 am

## 2021-06-04 NOTE — Progress Notes (Addendum)
Cardiology new patient note:    Date:  06/18/2021   ID:  Shane Diaz, DOB 03-Jan-1946, MRN [id number]  PCP:  [name], MD  Cardiologist:  Sanda Klein, MD New Electrophysiologist:  None   Referring MD: [name], MD   Chief Complaint  Patient presents with   Follow-up    3 months.     History of Present Illness:    Shane Diaz is a 75 y.o. 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   Initially was seen to have better success with diuresis with a combination of torsemide and metolazone, rather than furosemide.  Estimated dry weight is 260 pounds after his previous hospitalization.  We got down to a minimum 280 pounds, but he has gained it all back and actually weighs around 300 pounds again.  He has not developed new ulcerations in his legs and does not have any weeping wounds.  He has developed a worsening pruritic rash especially on his anterior neck and chest.  He says that the rash is always worse after he takes his morning medication (diltiazem and torsemide), but it does not occur after he takes his evening dose of Eliquis.  We had initially attributed the rash to amiodarone, but has been off this medication now for months.  He continues to be very poorly compliant with sodium restriction.  His favorite foods are very sodium rich.  He continues to sleep in a recliner and does not exhibit orthopnea or PND.  He is a loud snorer and has severe daytime hypersomnolence, but declines evaluation or treatment for sleep apnea.  We have scheduled him for sleep studies a couple of times, but  he never completed them.  On a positive note, he is maintaining normal sinus rhythm.  In the past, he is occasionally detected episodes of tachycardia with his pulse oximeter when his heart rate would reach around 150 bpm, likely atrial flutter.  He has never been aware of palpitations.  He also continues to be reluctant to commit to a sleep study, although I have told him repeatedly that I think the underlying cause for his right heart failure (and the worst factor in his prognosis) remains untreated sleep apnea.  His most recent labs show a borderline hemoglobin A1c of 5.8% with a fairly satisfactory cholesterol profile (very low HDL but also low total and LDL cholesterol).  He had Covid pneumonia requiring hospitalization in October 2021.  He was hospitalized with hypoxemia due to decompensated congestive heart failure and atrial flutter with rapid ventricular response in early December 2021.   On transthoracic echocardiogram LV EF was 50-55%, but the right ventricle was mildly dilated and mildly depressed.  TEE showed LVEF 45-50% and a severely enlarged and depressed right ventricle with moderate tricuspid regurgitation. He underwent TEE guided cardioversion successfully on 07/13/2020 and discharged on beta-blockers and anticoagulants, but returned and was hospitalized again with similar problems on 08/01/2020.  An attempt at cardioversion on 08/02/2020 was not successful. IV Amiodarone was initiated.  Cardioversion was tried again the next day, again unsuccessfully.  The plan was to continue loading with amiodarone and bring back for cardioversion.  He was seen back  in clinic in mid January and declined repeat cardioversion.  He was again hospitalized in February for volume overload complicated by cellulitis of the lower extremities, sepsis, atrial fibrillation rapid ventricular response.  He required fluid resuscitation with worsening edema.  The arrhythmia was managed conservatively and his volume  status improved after intravenous diuretics.  At hospital discharge his dry weight was estimated to be 270 pounds.  At that weight, his wife reports that his legs were thin and that he no longer had edema or blisters or skin wounds.  Once the patient returned home, he returned to his old habits of adding salt to his food, eating a lot of processed meats and pickles.  He spends the day sitting in a chair at the computer and has developed gradually worsening lower extremity edema.  His weight has gone up 16 pounds and he has developed a new weeping wound on his right leg and blisters on the back of his left calf.  He has been unable to wear compression stockings and does not like to keep his legs elevated.  He also developed a "lobster red" pruritic rash on his neck and shoulders and lower limbs.  He stopped taking amiodarone 4 days before this appointment and the rash is beginning to improve.  It is no longer as pruritic.  He has been taking Zyrtec.  He is on a relatively short list of medications, the only relatively new one is indeed amiodarone   In 2016, years ago he had a right distal femoral and popliteal DVT, partially resolved by follow-up ultrasound in late 2016 but with chronic occlusion of the right tibioperoneal venous trunk.  About 3 years ago he was having a lot of lower extremity pain related to large varicose veins and underwent venous ablation in his right lower extremity.  He had laser ablation of the left greater saphenous vein and stab phlebectomies of the left leg in November 2016  Starting in March 2020 he had gradually worsening pain and swelling in the left calf, more so than on the right.  In August of this year he developed severe swelling and discomfort in his left calf and was diagnosed with a DVT and started on Xarelto.  Switched to Eliquis later because of suspected allergy (I suspect this was actually only normal skin changes related to brawny edema).   He has a follow-up visit  and ultrasound scheduled with Dr. Scot Dock on March 11.  He denies angina or dyspnea at rest or with his usual activity (he is quite sedentary).  He has not had focal neurological events, syncope, palpitations or serious bleeding problems.  He canceled his sleep study.  It sounds like he would not use CPAP even if we did confirm the diagnosis.  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  He has been living in [place] for the last 10 years or so.  Past Medical History:  Diagnosis Date   Acute diastolic heart failure ([place]) 08/03/2020   Arthritis    Diverticulosis 05/2015   Mild, noted on colonosocpy   Fatty liver 05/14/2015   noted on [place] ABD   History of colon polyps 05/2014   sessile in ascending colon, pedunculated in sigmoid colon, diverticulosis   Hyperlipidemia    pt denies   Hypertension    not currently taking medications per MD   OSA (  obstructive sleep apnea) 08/03/2020   Renal cyst 05/2014   Small, Left, noted on [place] ABD   Umbilical hernia    Varicose veins     Past Surgical History:  Procedure Laterality Date   APPENDECTOMY     childhood   BIOPSY  07/11/2020   Procedure: BIOPSY;  Surgeon: Ladene Artist, MD;  Location: [facility];  Service: Endoscopy;;   CARDIOVERSION N/A 07/13/2020   Procedure: CARDIOVERSION;  Surgeon: Josue Hector, MD;  Location: [facility] ENDOSCOPY;  Service: Cardiovascular;  Laterality: N/A;   CARDIOVERSION N/A 08/02/2020   Procedure: CARDIOVERSION;  Surgeon: Werner Lean, MD;  Location: [facility] ENDOSCOPY;  Service: Cardiovascular;  Laterality: N/A;   COLONOSCOPY W/ POLYPECTOMY  05/23/2014   COLONOSCOPY WITH PROPOFOL N/A 07/11/2020   Procedure: COLONOSCOPY WITH PROPOFOL;  Surgeon: Ladene Artist, MD;  Location: [facility] ENDOSCOPY;  Service: Endoscopy;  Laterality: N/A;   ESOPHAGOGASTRODUODENOSCOPY (EGD) WITH  PROPOFOL N/A 07/11/2020   Procedure: ESOPHAGOGASTRODUODENOSCOPY (EGD) WITH PROPOFOL;  Surgeon: Ladene Artist, MD;  Location[facility] END[facility]COPY;  Service: Endoscopy;  Laterality: N/A;   TEE WITHOUT CARDIOVERSION N/A 07/13/2020   Procedure: TRANSESOPHAGEAL ECHOCARDIOGRAM (TEE);  Surgeon: Josue Hector, MD;  Location[facility] ENDOSCOPY;  Service: Cardiovascular;  Laterality: N/A;   UMBILICAL HERNIA REPAIR N/A 04/27/2018   Procedure: REPAIR UMBILICAL HERNIA ERAS PATHWAY;  Surgeon: Alphonsa Overall, MD;  Location: [facility] ORS;  Service: General;  Laterality: N/A;   varicose veins     WRIST SURGERY      Current Medications: Current Meds  Medication Sig   apixaban (ELIQUIS) 5 MG TABS tablet Take 1 tablet (5 mg total) by mouth 2 (two) times daily.   levothyroxine (SYNTHROID) 25 MCG tablet Take 25 mcg by mouth daily before breakfast.   Magnesium Oxide 400 MG CAPS Take 1 capsule (400 mg total) by mouth daily.   metoprolol succinate (TOPROL-XL) 50 MG 24 hr tablet Take 1 tablet (50 mg total) by mouth daily. Take with or immediately following a meal.   nitroGLYCERIN (NITROSTAT) 0.4 MG SL tablet Place 1 tablet (0.4 mg total) under the tongue every 5 (five) minutes x 3 doses as needed for chest pain.   Potassium Chloride ER 20 MEQ TBCR Take 20 mEq by mouth daily. ON DAYS YOU TAKE METOLAZONE TAKE AN EXTRA TABLET (12mEq TOTAL)   [DISCONTINUED] bumetanide (BUMEX) 2 MG tablet Take 1 tablet (2 mg total) by mouth daily.   [DISCONTINUED] diltiazem (CARDIZEM CD) 180 MG 24 hr capsule Take 1 capsule (180 mg total) by mouth daily.   [DISCONTINUED] torsemide (DEMADEX) 20 MG tablet Take 1 tablet (20 mg total) by mouth daily. Take 40 mg for a weight over 280 lbs     Allergies:   Amiodarone   Social History   Socioeconomic History   Marital status: Married    Spouse name: Not on file   Number of children: 0   Years of education: Not on file   Highest education level: Not on file  Occupational History   Not on file  Tobacco  Use   Smoking status: Never   Smokeless tobacco: Never  Vaping Use   Vaping Use: Never used  Substance and Sexual Activity   Alcohol use: Yes    Alcohol/week: 0.0 standard drinks    Comment: occassional wine, martini   Drug use: No   Sexual activity: Not on file  Other Topics Concern   Not on file  Social History Narrative   Not on file   Social Determinants  of Health   Financial Resource Strain: Not on file  Food Insecurity: Not on file  Transportation Needs: Not on file  Physical Activity: Not on file  Stress: Not on file  Social Connections: Not on file     Family History: The patient's family history is negative for Colon cancer, Esophageal cancer, Pancreatic cancer, Prostate cancer, Rectal cancer, and Stomach cancer.  ROS:   Please see the history of present illness.    All other systems are reviewed and are negative.   EKGs/Labs/Other Studies Reviewed:    The following studies were reviewed today: Extensive review of notes from his 3 hospitalizations in the last 3 months. Echocardiogram 03/19/2021  Study Result     1. Left ventricular ejection fraction, by estimation, is 55%. The left  ventricle has normal function. The left ventricle has no regional wall  motion abnormalities. There is severe left ventricular hypertrophy. Left  ventricular diastolic parameters are  indeterminate.   2. Right ventricular systolic function is normal. The right ventricular  size is normal.   3. Left atrial size was moderately dilated.   4. The mitral valve is abnormal. Trivial mitral valve regurgitation. No  evidence of mitral stenosis.   5. The aortic valve is tricuspid. Aortic valve regurgitation is not  visualized. Mild to moderate aortic valve sclerosis/calcification is  present, without any evidence of aortic stenosis.   6. The inferior vena cava is normal in size with greater than 50%  respiratory variability, suggesting right atrial pressure of 3 mmHg.      Transesophageal echocardiogram 07/13/2020   1. TEE with no RAA/LAA thrombus DCC x 1 with 200J converted from atrial  flutter rate 133 bpm to NSR rate 88 bpm On eliquis with no immediate  neurologic sequelae.   2. EF decreased with patient in rapid atrial flutter rate 133. Left  ventricular ejection fraction, by estimation, is 45 to 50%. The left  ventricle has mildly decreased function. There is mild left ventricular  hypertrophy.   3. Right ventricular systolic function is severely reduced. The right  ventricular size is severely enlarged.   4. Left atrial size was moderately dilated. No left atrial/left atrial  appendage thrombus was detected.   5. Right atrial size was severely dilated.   6. The mitral valve is normal in structure. Mild mitral valve  regurgitation.   7. Tricuspid valve regurgitation is moderate.   8. The aortic valve is tricuspid. Aortic valve regurgitation is mild.  Mild aortic valve sclerosis is present, with no evidence of aortic valve  stenosis.   EKG:    EKG is  ordered today.  Personally reviewed, very similar to previous tracings, shows sinus rhythm with first-degree AV block (PR 260 ms) and right bundle branch block.  QTc 459 months  Recent Labs: 09/24/2020: ALT 18 10/02/2020: Magnesium 1.9 03/18/2021: B Natriuretic Peptide 404.6; Hemoglobin 15.3; Platelets 199; TSH 6.379 03/19/2021: BUN 16; Creatinine, Ser 1.16; Potassium 4.6; Sodium 139       Component Value Date/Time   CHOL 103 05/08/2020 0335   TRIG 71 05/08/2020 0335   HDL 27 (L) 05/08/2020 0335   CHOLHDL 3.8 05/08/2020 0335   VLDL 14 05/08/2020 0335   LDLCALC 62 05/08/2020 0335    Physical Exam:    VS:  BP 128/70 (BP Location: Left Arm, Patient Position: Sitting, Cuff Size: Large)   Pulse 87   Ht 5\' 7"  (1.702 m)   Wt (!) 319 lb (144.7 kg)   BMI 49.96 kg/m  Wt Readings from Last 3 Encounters:  06/18/21 (!) 319 lb 6.4 oz (144.9 kg)  06/04/21 (!) 319 lb (144.7 kg)  03/21/21 (!)  301 lb (136.5 kg)      General: Alert, oriented x3, no distress, morbidly obese Head: no evidence of trauma, PERRL, EOMI, no exophtalmos or lid lag, no myxedema, no xanthelasma; normal ears, nose and oropharynx Neck: normal jugular venous pulsations and no hepatojugular reflux; brisk carotid pulses without delay and no carotid bruits.  Erythematous scaly rash on his anterior neck and upper half of his chest, clearly pruritic with scratch marks. Chest: clear to auscultation, no signs of consolidation by percussion or palpation, normal fremitus, symmetrical and full respiratory excursions Cardiovascular: normal position and quality of the apical impulse, regular rhythm, normal first and second heart sounds, no murmurs, rubs or gallops Abdomen: no tenderness or distention, no masses by palpation, no abnormal pulsatility or arterial bruits, normal bowel sounds, no hepatosplenomegaly Extremities: no clubbing, cyanosis; he has 4+ heart pitting edema on the right, slightly less severe than the left, although up to the knees bilaterally.  There were no open wounds or weeping at this time.   2+ radial, ulnar and brachial pulses bilaterally; 2+ right femoral, posterior tibial and dorsalis pedis pulses; 2+ left femoral, posterior tibial and dorsalis pedis pulses; no subclavian or femoral bruits Neurological: grossly nonfocal Psych: Normal mood and affect  ASSESSMENT:    1. Paroxysmal atrial fibrillation (HCC)   2. Chronic heart failure with preserved ejection fraction (HFpEF) ([place])   3. Postphlebitic syndrome   4. Obesity, Class III, BMI 40-49.9 (morbid obesity) ([place])   5. Prediabetes   6. OSA (obstructive sleep apnea)   7. 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  Patient had seemed to work much better than furosemide, but not anymore and he is developed a nasty rash.   Metolazone did not make much difference.  We will switch to bumetanide.  Bring him back in a couple of weeks and check labs. AFib/typical atrial flutter: Currently maintaining normal rhythm.  Compliant with Eliquis.  His rash does not appear to be amiodarone related, but could be due to diltiazem or torsemide.  If atrial fibrillation becomes a frequent problem, might want to try dofetilide before we commit to amiodarone again. 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  Repeated to him that it is critical that he reduce the swelling in his limbs to avoid recurrent problems with skin breakdown, cellulitis, sepsis.  Recurrent skin infection with further worsen the edema due to compromise of the lymphatic system.  Needs to keep his legs elevated throughout the day.  Sitting at the computer desk with legs dangling is very much counterproductive. Obesity: He has remains unwilling to participate in regular physical activity (with his current deconditioning and chronic respiratory problems I do not know that any program of exercise would be successful).  He really needs to reduce calorie intake. Pre-DM: Borderline glucose levels, generally favorable lipid profile except for very low HDL cholesterol. OSA: At his last appointment he appeared to be more receptive to the idea of undergoing testing and treatment for sleep apnea, but despite being scheduled he never showed up for the test. Rash: Etiology uncertain.  May be related to the diltiazem or torsemide.  We will stop them  both.  Substitute metoprolol succinate 50 mg once daily and bumetanide 2 mg once daily.    Medication Adjustments/Labs and Tests Ordered: Current medicines are reviewed at length with the patient today.  Concerns regarding medicines are outlined above.  Orders Placed This Encounter  Procedures   EKG 12-Lead      Meds ordered this encounter  Medications   metoprolol succinate  (TOPROL-XL) 50 MG 24 hr tablet    Sig: Take 1 tablet (50 mg total) by mouth daily. Take with or immediately following a meal.    Dispense:  90 tablet    Refill:  3   DISCONTD: bumetanide (BUMEX) 2 MG tablet    Sig: Take 1 tablet (2 mg total) by mouth daily.    Dispense:  30 tablet    Refill:  1       Patient Instructions  Medication Instructions:  STOP the Diltiazem STOP the Torsemide  START Metoprolol Succinate 50 mg once daily START Bumex 2 mg once daily  *If you need a refill on your cardiac medications before your next appointment, please call your pharmacy*   Lab Work: None ordered If you have labs (blood work) drawn today and your tests are completely normal, you will receive your results only by: [facility] (if you have MyChart) OR A paper copy in the mail If you have any lab test that is abnormal or we need to change your treatment, we will call you to review the results.   Testing/Procedures: None ordered   Follow-Up: At [facility], you and your health needs are our priority.  As part of our continuing mission to provide you with exceptional heart care, we have created designated Provider Care Teams.  These Care Teams include your primary Cardiologist (physician) and Advanced Practice Providers (APPs -  Physician Assistants and Nurse Practitioners) who all work together to provide you with the care you need, when you need it.  We recommend signing up for the patient portal called "MyChart".  Sign up information is provided on this After Visit Summary.  MyChart is used to connect with patients for Virtual Visits (Telemedicine).  Patients are able to view lab/test results, encounter notes, upcoming appointments, etc.  Non-urgent messages can be sent to your provider as well.   To learn more about what you can do with MyChart, go to [email or web address].    Your next appointment:   Keep your follow up with Dr. Sallyanne Kuster on 11/15 at 10:20 am    Signed, Sanda Klein, MD  06/18/2021 1:04 PM    [place]

## 2021-06-18 ENCOUNTER — Encounter: Payer: Self-pay | Admitting: Cardiovascular Disease

## 2021-06-18 ENCOUNTER — Other Ambulatory Visit: Payer: Self-pay

## 2021-06-18 ENCOUNTER — Ambulatory Visit (INDEPENDENT_AMBULATORY_CARE_PROVIDER_SITE_OTHER): Payer: Medicare Other | Admitting: Cardiovascular Disease

## 2021-06-18 VITALS — BP 128/73 | HR 80 | Ht 67.0 in | Wt 319.4 lb

## 2021-06-18 DIAGNOSIS — I480 Paroxysmal atrial fibrillation: Secondary | ICD-10-CM | POA: Diagnosis not present

## 2021-06-18 DIAGNOSIS — I5032 Chronic diastolic (congestive) heart failure: Secondary | ICD-10-CM | POA: Diagnosis not present

## 2021-06-18 DIAGNOSIS — I82541 Chronic embolism and thrombosis of right tibial vein: Secondary | ICD-10-CM | POA: Diagnosis not present

## 2021-06-18 DIAGNOSIS — L298 Other pruritus: Secondary | ICD-10-CM

## 2021-06-18 DIAGNOSIS — G4733 Obstructive sleep apnea (adult) (pediatric): Secondary | ICD-10-CM

## 2021-06-18 DIAGNOSIS — R7303 Prediabetes: Secondary | ICD-10-CM

## 2021-06-18 MED ORDER — BUMETANIDE 2 MG PO TABS: 4.0000 mg | ORAL_TABLET | Freq: Every day | ORAL | 2 refills | Status: DC

## 2021-06-18 NOTE — Patient Instructions (Signed)
Medication Instructions:  INCREASE the Bumex to 4 mg once daily  *If you need a refill on your cardiac medications before your next appointment, please call your pharmacy*   Lab Work: Your provider would like for you to return in one month to have the following labs drawn: BNP, BMET, fasting Lipid. You do not need an appointment for the lab. Once in our office lobby there is a podium where you can sign in and ring the doorbell to alert [place] that you are here. The lab is open from 8:00 am to 4:30 pm; closed for lunch from 12:45pm-1:45pm.  If you have labs (blood work) drawn today and your tests are completely normal, you will receive your results only by: [place] (if you have MyChart) OR A paper copy in the mail If you have any lab test that is abnormal or we need to change your treatment, we will call you to review the results.   Testing/Procedures: None ordered   Follow-Up: At [facility], you and your health needs are our priority.  As part of our continuing mission to provide you with exceptional heart care, we have created designated Provider Care Teams.  These Care Teams include your primary Cardiologist (physician) and Advanced Practice Providers (APPs -  Physician Assistants and Nurse Practitioners) who all work together to provide you with the care you need, when you need it.  We recommend signing up for the patient portal called "MyChart".  Sign up information is provided on this After Visit Summary.  MyChart is used to connect with patients for Virtual Visits (Telemedicine).  Patients are able to view lab/test results, encounter notes, upcoming appointments, etc.  Non-urgent messages can be sent to your provider as well.   To learn more about what you can do with MyChart, go to [email or web address].    Your next appointment:   3 month(s)  The format for your next appointment:   In Person  Provider:   Sanda Klein, MD

## 2021-06-18 NOTE — Progress Notes (Signed)
Cardiology new patient note:    Date:  06/18/2021   ID:  Shane Diaz, DOB January 29, 1946, MRN [id number]  PCP:  [name], MD  Cardiologist:  Sanda Klein, MD New Electrophysiologist:  None   Referring MD: [name], MD   No chief complaint on file.    History of Present Illness:    Shane Diaz is a 75 y.o. 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   We are trying to get rid of substantial peripheral edema with oral medications, without much success.  We have switched from furosemide to torsemide plus metolazone and now to bumetanide.  He appeared to have a pruritic rash on his anterior chest and on his back and we have been switching his diuretics, without resolution of the rash, and without really making a big dent in his fluid.  We also switched from diltiazem to metoprolol to see if that would help the rash.  He still weighs 319 pounds, exactly what he weighed 2 weeks ago when we switched from torsemide to bumetanide (the weight was not recorded correctly at his last visit, I corrected that today).  This is 60 pounds higher than his estimated dry weight of 260 pounds established at his previous hospitalization.  We did manage to diuresis down to 280 pounds at 1 point but he has gained it all back.  Thankfully he has not developed any new weeping wounds or ulcerations in his legs.  He is compliant with Eliquis.  He has not had any falls or bleeding problems.  He denies orthopnea or PND but has exertional dyspnea.  Today he makes a commitment to cut down on his sodium over the next  couple of months, but is skeptical that he will be successful since the holidays are coming up.  He eats a very high sodium diet usually.  He is a loud snorer and has severe daytime hypersomnolence.  We have scheduled him for sleep studies at least on 2 occasions and he has not ever completed a sleep test.  He is in slow regular rhythm today, probably sinus rhythm.  On a positive note, he is maintaining normal sinus rhythm.  In the past, he is occasionally detected episodes of tachycardia with his pulse oximeter when his heart rate would reach around 150 bpm, likely atrial flutter.  He has never been aware of palpitations.  He also continues to be reluctant to commit to a sleep study, although I have told him repeatedly that I think the underlying cause for his right heart failure (and the worst factor in his prognosis) remains untreated sleep apnea.  His most recent labs show a borderline hemoglobin A1c of 5.8% with a fairly satisfactory cholesterol profile (very low HDL but also low total and LDL cholesterol).  He had Covid pneumonia requiring hospitalization in October 2021.  He was hospitalized with hypoxemia due to decompensated congestive heart failure and atrial flutter with rapid ventricular response in early December 2021.   On transthoracic echocardiogram LV EF was 50-55%, but the right ventricle was mildly dilated and mildly depressed.  TEE showed LVEF 45-50% and a severely enlarged and depressed right ventricle with moderate tricuspid  regurgitation. He underwent TEE guided cardioversion successfully on 07/13/2020 and discharged on beta-blockers and anticoagulants, but returned and was hospitalized again with similar problems on 08/01/2020.  An attempt at cardioversion on 08/02/2020 was not successful. IV Amiodarone was initiated.  Cardioversion was tried again the next day, again unsuccessfully.  The plan was to continue loading with amiodarone and bring back for cardioversion.  He was seen  back in clinic in mid January and declined repeat cardioversion.  He was again hospitalized in February for volume overload complicated by cellulitis of the lower extremities, sepsis, atrial fibrillation rapid ventricular response.  He required fluid resuscitation with worsening edema.  The arrhythmia was managed conservatively and his volume status improved after intravenous diuretics.  At hospital discharge his dry weight was estimated to be 270 pounds.  At that weight, his wife reports that his legs were thin and that he no longer had edema or blisters or skin wounds.  Once the patient returned home, he returned to his old habits of adding salt to his food, eating a lot of processed meats and pickles.  He spends the day sitting in a chair at the computer and has developed gradually worsening lower extremity edema.  His weight has gone up 16 pounds and he has developed a new weeping wound on his right leg and blisters on the back of his left calf.  He has been unable to wear compression stockings and does not like to keep his legs elevated.  He also developed a "lobster red" pruritic rash on his neck and shoulders and lower limbs.  He stopped taking amiodarone 4 days before this appointment and the rash is beginning to improve.  It is no longer as pruritic.  He has been taking Zyrtec.  He is on a relatively short list of medications, the only relatively new one is indeed amiodarone   In 2016, years ago he had a right distal femoral and popliteal DVT, partially resolved by follow-up ultrasound in late 2016 but with chronic occlusion of the right tibioperoneal venous trunk.  About 3 years ago he was having a lot of lower extremity pain related to large varicose veins and underwent venous ablation in his right lower extremity.  He had laser ablation of the left greater saphenous vein and stab phlebectomies of the left leg in November 2016  Starting in March 2020 he had gradually worsening pain and swelling  in the left calf, more so than on the right.  In August of this year he developed severe swelling and discomfort in his left calf and was diagnosed with a DVT and started on Xarelto.  Switched to Eliquis later because of suspected allergy (I suspect this was actually only normal skin changes related to brawny edema).   He has a follow-up visit and ultrasound scheduled with Dr. Scot Dock on March 11.  He denies angina or dyspnea at rest or with his usual activity (he is quite sedentary).  He has not had focal neurological events, syncope, palpitations or serious bleeding problems.  He canceled his sleep study.  It sounds like he would not use CPAP even if we did confirm the diagnosis.  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  He has been living in [place] for the last 10 years or so.  Past Medical History:  Diagnosis Date   Acute diastolic heart  failure ([place]) 08/03/2020   Arthritis    Diverticulosis 05/2015   Mild, noted on colonosocpy   Fatty liver 05/14/2015   noted on [place] ABD   History of colon polyps 05/2014   sessile in ascending colon, pedunculated in sigmoid colon, diverticulosis   Hyperlipidemia    pt denies   Hypertension    not currently taking medications per MD   OSA (obstructive sleep apnea) 08/03/2020   Renal cyst 05/2014   Small, Left, noted on [place] ABD   Umbilical hernia    Varicose veins     Past Surgical History:  Procedure Laterality Date   APPENDECTOMY     childhood   BIOPSY  07/11/2020   Procedure: BIOPSY;  Surgeon: Ladene Artist, MD;  Location: [facility];  Service: Endoscopy;;   CARDIOVERSION N/A 07/13/2020   Procedure: CARDIOVERSION;  Surgeon: Josue Hector, MD;  Location: [facility] ENDOSCOPY;  Service: Cardiovascular;  Laterality: N/A;   CARDIOVERSION N/A 08/02/2020   Procedure: CARDIOVERSION;  Surgeon: Werner Lean, MD;  Location: [facility] ENDOSCOPY;  Service: Cardiovascular;  Laterality: N/A;   COLONOSCOPY W/ POLYPECTOMY  05/23/2014   COLONOSCOPY WITH PROPOFOL N/A 07/11/2020   Procedure: COLONOSCOPY WITH PROPOFOL;  Surgeon: Ladene Artist, MD;  Location: [facility] ENDOSCOPY;  Service: Endoscopy;  Laterality: N/A;   ESOPHAGOGASTRODUODENOSCOPY (EGD) WITH PROPOFOL N/A 07/11/2020   Procedure: ESOPHAGOGASTRODUODENOSCOPY (EGD) WITH PROPOFOL;  Surgeon: Ladene Artist, MD;  Location: [facility] ENDOSCOPY;  Service: Endoscopy;  Laterality: N/A;   TEE WITHOUT CARDIOVERSION N/A 07/13/2020   Procedure: TRANSESOPHAGEAL ECHOCARDIOGRAM (TEE);  Surgeon: Josue Hector, MD;  Location: [facility] ENDOSCOPY;  Service: Cardiovascular;  Laterality: N/A;   UMBILICAL HERNIA REPAIR N/A 04/27/2018   Procedure: REPAIR UMBILICAL HERNIA ERAS PATHWAY;  Surgeon: Alphonsa Overall, MD;  Location: [facility] ORS;  Service: General;  Laterality: N/A;   varicose veins     WRIST SURGERY      Current Medications: Current Meds  Medication Sig   apixaban (ELIQUIS) 5 MG TABS tablet Take 1 tablet (5 mg total) by mouth 2 (two) times daily.   levothyroxine (SYNTHROID) 25 MCG tablet Take 25 mcg by mouth daily before breakfast.   Magnesium Oxide 400 MG CAPS Take 1 capsule (400 mg total) by mouth daily.   metoprolol succinate (TOPROL-XL) 50 MG 24 hr tablet Take 1 tablet (50 mg total) by mouth daily. Take with or immediately following a meal.   nitroGLYCERIN (NITROSTAT) 0.4 MG SL tablet Place 1 tablet (0.4 mg total) under the tongue every 5 (five) minutes x 3 doses as needed for chest pain.   Potassium Chloride ER 20 MEQ TBCR Take 20 mEq by mouth daily. ON DAYS YOU TAKE METOLAZONE TAKE AN EXTRA TABLET (50mEq TOTAL)   [DISCONTINUED] bumetanide (BUMEX) 2 MG tablet Take 1 tablet (2 mg total) by mouth daily.     Allergies:   Amiodarone   Social History   Socioeconomic History   Marital status: Married    Spouse name: Not on file   Number of children: 0   Years of  education: Not on file   Highest education level: Not on file  Occupational History   Not on file  Tobacco Use   Smoking status: Never   Smokeless tobacco: Never  Vaping Use   Vaping Use: Never used  Substance and Sexual Activity   Alcohol use: Yes    Alcohol/week: 0.0 standard drinks    Comment: occassional wine, martini   Drug use: No   Sexual activity: Not on  file  Other Topics Concern   Not on file  Social History Narrative   Not on file   Social Determinants of Health   Financial Resource Strain: Not on file  Food Insecurity: Not on file  Transportation Needs: Not on file  Physical Activity: Not on file  Stress: Not on file  Social Connections: Not on file     Family History: The patient's family history is negative for Colon cancer, Esophageal cancer, Pancreatic cancer, Prostate cancer, Rectal cancer, and Stomach cancer.  ROS:   Please see the history of present illness.    All other systems are reviewed and are negative.   EKGs/Labs/Other Studies Reviewed:    The following studies were reviewed today: Extensive review of notes from his 3 hospitalizations in the last 3 months. Echocardiogram 03/19/2021  Study Result     1. Left ventricular ejection fraction, by estimation, is 55%. The left  ventricle has normal function. The left ventricle has no regional wall  motion abnormalities. There is severe left ventricular hypertrophy. Left  ventricular diastolic parameters are  indeterminate.   2. Right ventricular systolic function is normal. The right ventricular  size is normal.   3. Left atrial size was moderately dilated.   4. The mitral valve is abnormal. Trivial mitral valve regurgitation. No  evidence of mitral stenosis.   5. The aortic valve is tricuspid. Aortic valve regurgitation is not  visualized. Mild to moderate aortic valve sclerosis/calcification is  present, without any evidence of aortic stenosis.   6. The inferior vena cava is normal in  size with greater than 50%  respiratory variability, suggesting right atrial pressure of 3 mmHg.     Transesophageal echocardiogram 07/13/2020   1. TEE with no RAA/LAA thrombus DCC x 1 with 200J converted from atrial  flutter rate 133 bpm to NSR rate 88 bpm On eliquis with no immediate  neurologic sequelae.   2. EF decreased with patient in rapid atrial flutter rate 133. Left  ventricular ejection fraction, by estimation, is 45 to 50%. The left  ventricle has mildly decreased function. There is mild left ventricular  hypertrophy.   3. Right ventricular systolic function is severely reduced. The right  ventricular size is severely enlarged.   4. Left atrial size was moderately dilated. No left atrial/left atrial  appendage thrombus was detected.   5. Right atrial size was severely dilated.   6. The mitral valve is normal in structure. Mild mitral valve  regurgitation.   7. Tricuspid valve regurgitation is moderate.   8. The aortic valve is tricuspid. Aortic valve regurgitation is mild.  Mild aortic valve sclerosis is present, with no evidence of aortic valve  stenosis.   EKG:    EKG is  ordered today.  Personally reviewed, very similar to previous tracings, shows sinus rhythm with first-degree AV block (PR 260 ms) and right bundle branch block.  QTc 459 months  Recent Labs: 09/24/2020: ALT 18 10/02/2020: Magnesium 1.9 03/18/2021: B Natriuretic Peptide 404.6; Hemoglobin 15.3; Platelets 199; TSH 6.379 03/19/2021: BUN 16; Creatinine, Ser 1.16; Potassium 4.6; Sodium 139       Component Value Date/Time   CHOL 103 05/08/2020 0335   TRIG 71 05/08/2020 0335   HDL 27 (L) 05/08/2020 0335   CHOLHDL 3.8 05/08/2020 0335   VLDL 14 05/08/2020 0335   LDLCALC 62 05/08/2020 0335    Physical Exam:    VS:  BP 128/73   Pulse 80   Ht 5\' 7"  (1.702 m)  Wt (!) 319 lb 6.4 oz (144.9 kg)   SpO2 93%   BMI 50.03 kg/m     Wt Readings from Last 3 Encounters:  06/18/21 (!) 319 lb 6.4 oz (144.9 kg)   06/04/21 (!) 319 lb (144.7 kg)  03/21/21 (!) 301 lb (136.5 kg)      General: Alert, oriented x3, no distress, morbidly obese Head: no evidence of trauma, PERRL, EOMI, no exophtalmos or lid lag, no myxedema, no xanthelasma; normal ears, nose and oropharynx Neck: normal jugular venous pulsations and no hepatojugular reflux; brisk carotid pulses without delay and no carotid bruits Chest: clear to auscultation, no signs of consolidation by percussion or palpation, normal fremitus, symmetrical and full respiratory excursions Cardiovascular: normal position and quality of the apical impulse, regular rhythm, normal first and second heart sounds, no murmurs, rubs or gallops Abdomen: no tenderness or distention, no masses by palpation, no abnormal pulsatility or arterial bruits, normal bowel sounds, no hepatosplenomegaly Extremities: no clubbing, cyanosis; he has 4+ hard pitting edema with chronic trophic changes and scars from healed ulcerations bilaterally; unable to palpate pedal pulses due to edema.   Neurological: grossly nonfocal Psych: Normal mood and affect   ASSESSMENT:    1. Chronic heart failure with preserved ejection fraction (HFpEF) (HCC)   2. Paroxysmal atrial fibrillation ([place])   3. 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 Morbid obesity ([place])   5. Prediabetes   6. OSA (obstructive sleep apnea)   7. 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  Refractory to oral diuretics.  We will increase his bumetanide to 4 mg daily today, but I am concerned that we will need IV diuretics before we make any dent in his hypervolemia.  He wants to avoid hospitalization.  We will try to see if we can get intravenous diuretics through the heart failure clinic.  Repeatedly pointed out that without careful sodium restriction  and treatment of his sleep apnea it will be very hard to treat his edema.  Recheck labs in a few weeks. AFib/typical atrial flutter: Currently maintaining normal rhythm.  Compliant with Eliquis.  We attributed his rash to amiodarone initially but this medicine has been stopped for several months and he still has a pruritic rash. 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  Repeated to him that it is critical that he reduce the swelling in his limbs to avoid recurrent problems with skin breakdown, cellulitis, sepsis.  Recurrent skin infection with further worsen the edema due to compromise of the lymphatic system.  Needs to keep his legs elevated throughout the day.  Sitting at the computer desk with legs dangling is very much counterproductive. Obesity: Unwilling and unable to really participate in regular physical activity.  Need to focus on reduction of calorie intake. Pre-DM: Borderline glucose levels, generally favorable lipid profile except for very low HDL cholesterol. OSA: We have scheduled him for sleep studies repeatedly, he is never had them done. Rash: Initially felt to be due to amiodarone but has been off this medication for so many months that this is highly doubtful.  We stop diltiazem and torsemide at his last appointment and replaced them with metoprolol and bumetanide.  The rash does not seem to be getting any better.  Medication Adjustments/Labs and Tests Ordered: Current medicines are reviewed at length with the patient today.  Concerns regarding medicines are outlined above.  Orders Placed This Encounter  Procedures   Brain natriuretic peptide   Basic metabolic panel   Lipid panel       Meds ordered this encounter  Medications   bumetanide (BUMEX) 2 MG tablet    Sig: Take 2 tablets (4 mg total) by mouth daily.    Dispense:  60 tablet    Refill:  2        Patient Instructions  Medication Instructions:   INCREASE the Bumex to 4 mg once daily  *If you need a refill on your cardiac medications before your next appointment, please call your pharmacy*   Lab Work: Your provider would like for you to return in one month to have the following labs drawn: BNP, BMET, fasting Lipid. You do not need an appointment for the lab. Once in our office lobby there is a podium where you can sign in and ring the doorbell to alert [place] that you are here. The lab is open from 8:00 am to 4:30 pm; closed for lunch from 12:45pm-1:45pm.  If you have labs (blood work) drawn today and your tests are completely normal, you will receive your results only by: [place] (if you have MyChart) OR A paper copy in the mail If you have any lab test that is abnormal or we need to change your treatment, we will call you to review the results.   Testing/Procedures: None ordered   Follow-Up: At [facility], you and your health needs are our priority.  As part of our continuing mission to provide you with exceptional heart care, we have created designated Provider Care Teams.  These Care Teams include your primary Cardiologist (physician) and Advanced Practice Providers (APPs -  Physician Assistants and Nurse Practitioners) who all work together to provide you with the care you need, when you need it.  We recommend signing up for the patient portal called "MyChart".  Sign up information is provided on this After Visit Summary.  MyChart is used to connect with patients for Virtual Visits (Telemedicine).  Patients are able to view lab/test results, encounter notes, upcoming appointments, etc.  Non-urgent messages can be sent to your provider as well.   To learn more about what you can do with MyChart, go to [email or web address].    Your next appointment:   3 month(s)  The format for your next appointment:   In Person  Provider:   Sanda Klein, MD       Signed, Sanda Klein, MD  06/18/2021 1:11 PM    [place]

## 2021-07-10 ENCOUNTER — Telehealth: Payer: Self-pay

## 2021-07-10 NOTE — Telephone Encounter (Signed)
Letter has been sent to patient instructing them to call us if they are still interested in completing their sleep study. If we have not received a response from the patient within 30 days of this notice, the order will be cancelled and they will need to discuss the need for a sleep study at their next office visit.  ° °

## 2021-08-12 ENCOUNTER — Other Ambulatory Visit: Payer: Self-pay | Admitting: Cardiovascular Disease

## 2021-09-13 ENCOUNTER — Emergency Department (HOSPITAL_COMMUNITY): Payer: Medicare Other

## 2021-09-13 ENCOUNTER — Inpatient Hospital Stay (HOSPITAL_COMMUNITY): Payer: Medicare Other

## 2021-09-13 ENCOUNTER — Other Ambulatory Visit: Payer: Self-pay

## 2021-09-13 ENCOUNTER — Encounter (HOSPITAL_COMMUNITY): Payer: Self-pay | Admitting: *Deleted

## 2021-09-13 ENCOUNTER — Inpatient Hospital Stay (HOSPITAL_COMMUNITY)
Admission: EM | Admit: 2021-09-13 | Discharge: 2021-09-17 | DRG: 602 | Disposition: A | Discharge status: Home / Self Care | Payer: Medicare Other | Attending: Internal Medicine | Admitting: Internal Medicine

## 2021-09-13 DIAGNOSIS — Z6841 Body Mass Index (BMI) 40.0 and over, adult: Secondary | ICD-10-CM | POA: Diagnosis not present

## 2021-09-13 DIAGNOSIS — K760 Fatty (change of) liver, not elsewhere classified: Secondary | ICD-10-CM | POA: Diagnosis present

## 2021-09-13 DIAGNOSIS — I110 Hypertensive heart disease with heart failure: Secondary | ICD-10-CM | POA: Diagnosis present

## 2021-09-13 DIAGNOSIS — E875 Hyperkalemia: Secondary | ICD-10-CM | POA: Diagnosis present

## 2021-09-13 DIAGNOSIS — J9611 Chronic respiratory failure with hypoxia: Secondary | ICD-10-CM | POA: Diagnosis present

## 2021-09-13 DIAGNOSIS — I2781 Cor pulmonale (chronic): Secondary | ICD-10-CM | POA: Diagnosis present

## 2021-09-13 DIAGNOSIS — Z9981 Dependence on supplemental oxygen: Secondary | ICD-10-CM | POA: Diagnosis not present

## 2021-09-13 DIAGNOSIS — I482 Chronic atrial fibrillation, unspecified: Secondary | ICD-10-CM | POA: Diagnosis present

## 2021-09-13 DIAGNOSIS — I480 Paroxysmal atrial fibrillation: Secondary | ICD-10-CM

## 2021-09-13 DIAGNOSIS — Z20822 Contact with and (suspected) exposure to covid-19: Secondary | ICD-10-CM | POA: Diagnosis present

## 2021-09-13 DIAGNOSIS — L03119 Cellulitis of unspecified part of limb: Secondary | ICD-10-CM

## 2021-09-13 DIAGNOSIS — N179 Acute kidney failure, unspecified: Secondary | ICD-10-CM | POA: Diagnosis not present

## 2021-09-13 DIAGNOSIS — S8012XA Contusion of left lower leg, initial encounter: Secondary | ICD-10-CM | POA: Diagnosis present

## 2021-09-13 DIAGNOSIS — M1990 Unspecified osteoarthritis, unspecified site: Secondary | ICD-10-CM | POA: Diagnosis present

## 2021-09-13 DIAGNOSIS — E871 Hypo-osmolality and hyponatremia: Secondary | ICD-10-CM | POA: Diagnosis not present

## 2021-09-13 DIAGNOSIS — I5033 Acute on chronic diastolic (congestive) heart failure: Secondary | ICD-10-CM

## 2021-09-13 DIAGNOSIS — I5032 Chronic diastolic (congestive) heart failure: Secondary | ICD-10-CM

## 2021-09-13 DIAGNOSIS — I10 Essential (primary) hypertension: Secondary | ICD-10-CM | POA: Diagnosis present

## 2021-09-13 DIAGNOSIS — L0390 Cellulitis, unspecified: Secondary | ICD-10-CM | POA: Diagnosis not present

## 2021-09-13 DIAGNOSIS — Z79899 Other long term (current) drug therapy: Secondary | ICD-10-CM | POA: Diagnosis not present

## 2021-09-13 DIAGNOSIS — T148XXA Other injury of unspecified body region, initial encounter: Secondary | ICD-10-CM | POA: Diagnosis present

## 2021-09-13 DIAGNOSIS — K5790 Diverticulosis of intestine, part unspecified, without perforation or abscess without bleeding: Secondary | ICD-10-CM | POA: Diagnosis present

## 2021-09-13 DIAGNOSIS — I4891 Unspecified atrial fibrillation: Secondary | ICD-10-CM | POA: Diagnosis present

## 2021-09-13 DIAGNOSIS — Z888 Allergy status to other drugs, medicaments and biological substances status: Secondary | ICD-10-CM

## 2021-09-13 DIAGNOSIS — E785 Hyperlipidemia, unspecified: Secondary | ICD-10-CM | POA: Diagnosis present

## 2021-09-13 DIAGNOSIS — Z79890 Hormone replacement therapy (postmenopausal): Secondary | ICD-10-CM | POA: Diagnosis not present

## 2021-09-13 DIAGNOSIS — Z86718 Personal history of other venous thrombosis and embolism: Secondary | ICD-10-CM

## 2021-09-13 DIAGNOSIS — Z7901 Long term (current) use of anticoagulants: Secondary | ICD-10-CM

## 2021-09-13 DIAGNOSIS — W010XXA Fall on same level from slipping, tripping and stumbling without subsequent striking against object, initial encounter: Secondary | ICD-10-CM | POA: Diagnosis present

## 2021-09-13 DIAGNOSIS — W19XXXA Unspecified fall, initial encounter: Principal | ICD-10-CM

## 2021-09-13 DIAGNOSIS — L03116 Cellulitis of left lower limb: Principal | ICD-10-CM | POA: Diagnosis present

## 2021-09-13 DIAGNOSIS — I50813 Acute on chronic right heart failure: Secondary | ICD-10-CM | POA: Diagnosis not present

## 2021-09-13 DIAGNOSIS — M7989 Other specified soft tissue disorders: Secondary | ICD-10-CM | POA: Diagnosis not present

## 2021-09-13 DIAGNOSIS — G4733 Obstructive sleep apnea (adult) (pediatric): Secondary | ICD-10-CM | POA: Diagnosis present

## 2021-09-13 LAB — RESP PANEL BY RT-PCR (FLU A&B, COVID) ARPGX2
Influenza A by PCR: NEGATIVE
Influenza B by PCR: NEGATIVE
SARS Coronavirus 2 by RT PCR: NEGATIVE

## 2021-09-13 LAB — CBC WITH DIFFERENTIAL/PLATELET
Abs Immature Granulocytes: 0.05 10*3/uL (ref 0.00–0.07)
Basophils Absolute: 0.1 10*3/uL (ref 0.0–0.1)
Basophils Relative: 1 %
Eosinophils Absolute: 0.4 10*3/uL (ref 0.0–0.5)
Eosinophils Relative: 4 %
HCT: 44.3 % (ref 39.0–52.0)
Hemoglobin: 13.7 g/dL (ref 13.0–17.0)
Immature Granulocytes: 1 %
Lymphocytes Relative: 17 %
Lymphs Abs: 1.7 10*3/uL (ref 0.7–4.0)
MCH: 31.1 pg (ref 26.0–34.0)
MCHC: 30.9 g/dL (ref 30.0–36.0)
MCV: 100.5 fL — ABNORMAL HIGH (ref 80.0–100.0)
Monocytes Absolute: 1.2 10*3/uL — ABNORMAL HIGH (ref 0.1–1.0)
Monocytes Relative: 12 %
Neutro Abs: 6.4 10*3/uL (ref 1.7–7.7)
Neutrophils Relative %: 65 %
Platelets: 200 10*3/uL (ref 150–400)
RBC: 4.41 MIL/uL (ref 4.22–5.81)
RDW: 13.4 % (ref 11.5–15.5)
WBC: 9.7 10*3/uL (ref 4.0–10.5)
nRBC: 0 % (ref 0.0–0.2)

## 2021-09-13 LAB — BASIC METABOLIC PANEL
Anion gap: 9 (ref 5–15)
BUN: 18 mg/dL (ref 8–23)
CO2: 34 mmol/L — ABNORMAL HIGH (ref 22–32)
Calcium: 8.9 mg/dL (ref 8.9–10.3)
Chloride: 92 mmol/L — ABNORMAL LOW (ref 98–111)
Creatinine, Ser: 1.16 mg/dL (ref 0.61–1.24)
GFR, Estimated: >60 mL/min (ref >60)
Glucose, Bld: 123 mg/dL — ABNORMAL HIGH (ref 70–99)
Potassium: 4 mmol/L (ref 3.5–5.1)
Sodium: 135 mmol/L (ref 135–145)

## 2021-09-13 LAB — TSH: TSH: 2.885 u[IU]/mL (ref 0.350–4.500)

## 2021-09-13 MED ORDER — MORPHINE SULFATE (PF) 2 MG/ML IV SOLN: 2.0000 mg | INTRAVENOUS | Status: DC | PRN

## 2021-09-13 MED ORDER — ONDANSETRON HCL 4 MG/2ML IJ SOLN: 4.0000 mg | Freq: Four times a day (QID) | INTRAMUSCULAR | Status: DC | PRN

## 2021-09-13 MED ORDER — LEVOTHYROXINE SODIUM 25 MCG PO TABS: 25.0000 ug | ORAL_TABLET | Freq: Every day | ORAL | Status: DC

## 2021-09-13 MED ORDER — ACETAMINOPHEN 325 MG PO TABS
650.0000 mg | ORAL_TABLET | Freq: Four times a day (QID) | ORAL | Status: DC | PRN
  Administered 2021-09-15 – 2021-09-17 (×3): 650 mg via ORAL
  Filled 2021-09-13 (×3): qty 2

## 2021-09-13 MED ORDER — NITROGLYCERIN 0.4 MG SL SUBL: 0.4000 mg | SUBLINGUAL_TABLET | SUBLINGUAL | Status: DC | PRN

## 2021-09-13 MED ORDER — HYDRALAZINE HCL 20 MG/ML IJ SOLN: 10.0000 mg | Freq: Four times a day (QID) | INTRAMUSCULAR | Status: DC | PRN

## 2021-09-13 MED ORDER — METOPROLOL SUCCINATE ER 50 MG PO TB24
50.0000 mg | ORAL_TABLET | Freq: Every day | ORAL | Status: DC
Start: 2021-09-13 — End: 2021-09-18
  Administered 2021-09-13 – 2021-09-17 (×5): 50 mg via ORAL
  Filled 2021-09-13 (×5): qty 1

## 2021-09-13 MED ORDER — FUROSEMIDE 10 MG/ML IJ SOLN
40.0000 mg | Freq: Three times a day (TID) | INTRAMUSCULAR | Status: DC
  Administered 2021-09-13 – 2021-09-14 (×2): 40 mg via INTRAVENOUS
  Filled 2021-09-13 (×2): qty 4

## 2021-09-13 MED ORDER — SODIUM CHLORIDE 0.9 % IV SOLN
2.0000 g | Freq: Once | INTRAVENOUS | Status: AC
  Administered 2021-09-13: 2 g via INTRAVENOUS
  Filled 2021-09-13: qty 2

## 2021-09-13 MED ORDER — SODIUM CHLORIDE 0.9 % IV SOLN: 2.0000 g | Freq: Once | INTRAVENOUS | Status: DC

## 2021-09-13 MED ORDER — HYDROCODONE-ACETAMINOPHEN 5-325 MG PO TABS: 1.0000 | ORAL_TABLET | ORAL | Status: DC | PRN

## 2021-09-13 MED ORDER — POLYETHYLENE GLYCOL 3350 17 G PO PACK: 17.0000 g | PACK | Freq: Every day | ORAL | Status: DC | PRN

## 2021-09-13 MED ORDER — VANCOMYCIN HCL IN DEXTROSE 1-5 GM/200ML-% IV SOLN
1000.0000 mg | Freq: Once | INTRAVENOUS | Status: AC
  Administered 2021-09-13: 1000 mg via INTRAVENOUS
  Filled 2021-09-13: qty 200

## 2021-09-13 MED ORDER — MAGNESIUM OXIDE -MG SUPPLEMENT 400 (240 MG) MG PO TABS
400.0000 mg | ORAL_TABLET | Freq: Every day | ORAL | Status: DC
  Administered 2021-09-14 – 2021-09-17 (×4): 400 mg via ORAL
  Filled 2021-09-13 (×4): qty 1

## 2021-09-13 MED ORDER — ONDANSETRON HCL 4 MG PO TABS
4.0000 mg | ORAL_TABLET | Freq: Four times a day (QID) | ORAL | Status: DC | PRN
Start: 2021-09-13 — End: 2021-09-18

## 2021-09-13 MED ORDER — ACETAMINOPHEN 650 MG RE SUPP: 650.0000 mg | Freq: Four times a day (QID) | RECTAL | Status: DC | PRN

## 2021-09-13 MED ORDER — SODIUM CHLORIDE 0.9 % IV SOLN
2.0000 g | Freq: Three times a day (TID) | INTRAVENOUS | Status: DC
  Administered 2021-09-13 – 2021-09-16 (×8): 2 g via INTRAVENOUS
  Filled 2021-09-13 (×8): qty 2

## 2021-09-13 MED ORDER — FUROSEMIDE 10 MG/ML IJ SOLN
40.0000 mg | Freq: Two times a day (BID) | INTRAMUSCULAR | Status: DC
  Administered 2021-09-13: 40 mg via INTRAVENOUS
  Filled 2021-09-13: qty 4

## 2021-09-13 MED ORDER — VANCOMYCIN HCL IN DEXTROSE 1-5 GM/200ML-% IV SOLN: 1000.0000 mg | Freq: Once | INTRAVENOUS | Status: DC

## 2021-09-13 MED ORDER — VANCOMYCIN HCL 1500 MG/300ML IV SOLN
1500.0000 mg | INTRAVENOUS | Status: DC
  Administered 2021-09-14 – 2021-09-15 (×2): 1500 mg via INTRAVENOUS
  Filled 2021-09-13 (×2): qty 300

## 2021-09-13 NOTE — Assessment & Plan Note (Addendum)
-   PT evaluation 

## 2021-09-13 NOTE — Assessment & Plan Note (Addendum)
-   Currently rate controlled, continue metoprolol - CT of the left lower extremity shows 3.3 x 7.9 cm hematoma and 5.2x 1.8x 6.2 cm also similar appearing possible hematoma.  Eliquis currently held.

## 2021-09-13 NOTE — ED Triage Notes (Signed)
Pt went to get onto electric wheelchair and he fell down (on Monday).  Pt on chronic 2 liters at home. Pt with pain and significant swelling to the LLE from the knee down. States unable to bear weight.

## 2021-09-13 NOTE — ED Provider Notes (Signed)
[place] DEPT Provider Note   CSN: [id number] Arrival date & time: 09/13/21  5956     History  Chief Complaint  Patient presents with   Shane Diaz    Jerron Niblack is a 76 y.o. male.  Patient is a 76 year old male who presents with pain to his left lower leg.  He normally ambulates with a walker.  He said that he was at [place] on Monday, 5 days ago, and he was trying to get out of a motorized vehicle there to shop.  He tripped and fell falling on his left lower leg.  He did not hit his head.  He denies any other injuries.  He said initially he did not have too much pain in the area but as the week progressed he has had increased swelling and pain to his leg.  It is primarily at the knee and below.  No hip pain.  Per chart review, he has a history of hypertension, hyperlipidemia, CHF and paroxysmal atrial fibrillation on Eliquis.  He has some chronic swelling of his lower extremities but his left leg is markedly more swollen than his right at this point.      Home Medications Prior to Admission medications   Medication Sig Start Date End Date Taking? Authorizing Provider  apixaban (ELIQUIS) 5 MG TABS tablet Take 1 tablet (5 mg total) by mouth 2 (two) times daily. 01/21/21   [name], [name], MD  bumetanide (BUMEX) 2 MG tablet Take 2 tablets (4 mg total) by mouth daily. 06/18/21   [name], [name], MD  levothyroxine (SYNTHROID) 25 MCG tablet Take 25 mcg by mouth daily before breakfast.    [provider]  Magnesium Oxide 400 MG CAPS Take 1 capsule (400 mg total) by mouth daily. 12/14/20   [name], [name], MD  metoprolol succinate (TOPROL-XL) 50 MG 24 hr tablet Take 1 tablet (50 mg total) by mouth daily. Take with or immediately following a meal. 06/04/21 09/02/21  [name], Dani Gobble, MD  nitroGLYCERIN (NITROSTAT) 0.4 MG SL tablet Place 1 tablet (0.4 mg total) under the tongue every 5 (five) minutes x 3 doses as needed for chest pain. 08/03/20   Isaiah Serge, NP  Potassium Chloride ER 20 MEQ TBCR TAKE ONE TABLET BY MOUTH DAILY *ON THE DAYS THAT YOU TAKE METOLAZONE TAKE AN EXTRA TABLET *40MEQ TOTAL* 08/13/21   [name], [name], MD      Allergies    Amiodarone    Review of Systems   Review of Systems  Constitutional:  Negative for chills, diaphoresis, fatigue and fever.  HENT:  Negative for congestion, rhinorrhea and sneezing.   Eyes: Negative.   Respiratory:  Negative for cough, chest tightness and shortness of breath.   Cardiovascular:  Positive for leg swelling. Negative for chest pain.  Gastrointestinal:  Negative for abdominal pain, blood in stool, diarrhea, nausea and vomiting.  Genitourinary:  Negative for difficulty urinating, flank pain, frequency and hematuria.  Musculoskeletal:  Positive for arthralgias and myalgias. Negative for back pain.  Skin:  Negative for rash.  Neurological:  Negative for dizziness, speech difficulty, weakness, numbness and headaches.   Physical Exam Updated Vital Signs BP 117/79 (BP Location: Left Arm)    Pulse 81    Temp 99.3 F (37.4 C) (Oral)    Resp 18    SpO2 95%  Physical Exam Constitutional:      Appearance: He is well-developed.  HENT:     Head: Normocephalic and atraumatic.  Eyes:  Pupils: Pupils are equal, round, and reactive to light.  Cardiovascular:     Rate and Rhythm: Normal rate and regular rhythm.     Heart sounds: Normal heart sounds.  Pulmonary:     Effort: Pulmonary effort is normal. No respiratory distress.     Breath sounds: Normal breath sounds. No wheezing or rales.  Chest:     Chest wall: No tenderness.  Abdominal:     General: Bowel sounds are normal.     Palpations: Abdomen is soft.     Tenderness: There is no abdominal tenderness. There is no guarding or rebound.  Musculoskeletal:        General: Normal range of motion.     Cervical back: Normal range of motion and neck supple.     Comments: Patient has 2+ pitting edema to the right lower extremity with some  mild erythema.  He has marked edema to his left lower extremity with marked erythema.  He also has some ecchymosis around the proximal tibia area.  Pedal pulses are intact.  It is warm to the touch.  He has some mild tenderness on palpation of his lateral foot.  No pain on range of motion of the hip.  Lymphadenopathy:     Cervical: No cervical adenopathy.  Skin:    General: Skin is warm and dry.     Findings: No rash.  Neurological:     Mental Status: He is alert and oriented to person, place, and time.     ED Results / Procedures / Treatments   Labs (all labs ordered are listed, but only abnormal results are displayed) Labs Reviewed  BASIC METABOLIC PANEL - Abnormal; Notable for the following components:      Result Value   Chloride 92 (*)    CO2 34 (*)    Glucose, Bld 123 (*)    All other components within normal limits  CBC WITH DIFFERENTIAL/PLATELET - Abnormal; Notable for the following components:   MCV 100.5 (*)    Monocytes Absolute 1.2 (*)    All other components within normal limits  CULTURE, BLOOD (ROUTINE X 2)  CULTURE, BLOOD (ROUTINE X 2)  RESP PANEL BY RT-PCR (FLU A&B, COVID) ARPGX2    EKG None  Radiology DG Tibia/Fibula Left  Result Date: 09/13/2021 CLINICAL DATA:  Left lower leg pain and swelling after recent fall. EXAM: LEFT TIBIA AND FIBULA - 2 VIEW COMPARISON:  None. FINDINGS: No acute fracture or dislocation. Severe medial compartment joint space narrowing of the knee. The left ankle is grossly unremarkable. Severe diffuse soft tissue swelling. IMPRESSION: 1. Severe diffuse soft tissue swelling. No acute osseous abnormality. Electronically Signed   By: Titus Dubin M.D.   On: 09/13/2021 07:59   DG Foot Complete Left  Result Date: 09/13/2021 CLINICAL DATA:  Left foot pain and swelling after recent fall. EXAM: LEFT FOOT - COMPLETE 3+ VIEW COMPARISON:  None. FINDINGS: No acute fracture or dislocation. Old healed fracture of the distal second metatarsal.  Joint spaces are preserved. Severe diffuse soft tissue swelling. IMPRESSION: 1. No acute osseous abnormality. 2. Severe diffuse soft tissue swelling. Electronically Signed   By: Titus Dubin M.D.   On: 09/13/2021 08:02   VAS [place] LOWER EXTREMITY VENOUS (DVT) (7a-7p)  Result Date: 09/13/2021  Lower Venous DVT Study Patient Name:  THAILAN SAVA  Date of Exam:   09/13/2021 Medical Rec #: [id number]      Accession #:    [id number] Date of Birth: 10/27/1945  Patient Gender: M Patient Age:   76 years Exam Location:  [facility] Procedure:      VAS [place] LOWER EXTREMITY VENOUS (DVT) Referring Phys: Noriah Osgood --------------------------------------------------------------------------------  Indications: Swelling.  Risk Factors: Trauma. Limitations: Body habitus and poor ultrasound/tissue interface. Comparison Study: No prior studies. Performing Technologist: Oliver Hum RVT  Examination Guidelines: A complete evaluation includes B-mode imaging, spectral Doppler, color Doppler, and power Doppler as needed of all accessible portions of each vessel. Bilateral testing is considered an integral part of a complete examination. Limited examinations for reoccurring indications may be performed as noted. The reflux portion of the exam is performed with the patient in reverse Trendelenburg.  +-----+---------------+---------+-----------+----------+--------------+  RIGHT Compressibility Phasicity Spontaneity Properties Thrombus Aging  +-----+---------------+---------+-----------+----------+--------------+  CFV   Full            Yes       Yes                                    +-----+---------------+---------+-----------+----------+--------------+   +---------+---------------+---------+-----------+----------+-------------------+  LEFT      Compressibility Phasicity Spontaneity Properties Thrombus Aging       +---------+---------------+---------+-----------+----------+-------------------+  CFV       Full            Yes        Yes                                         +---------+---------------+---------+-----------+----------+-------------------+  SFJ       Full                                                                  +---------+---------------+---------+-----------+----------+-------------------+  FV Prox   Full                                                                  +---------+---------------+---------+-----------+----------+-------------------+  FV Mid                    Yes       Yes                                         +---------+---------------+---------+-----------+----------+-------------------+  FV Distal                 Yes       Yes                                         +---------+---------------+---------+-----------+----------+-------------------+  PFV       Full                                                                  +---------+---------------+---------+-----------+----------+-------------------+  POP       Full            Yes       Yes                                         +---------+---------------+---------+-----------+----------+-------------------+  PTV       Full                                                                  +---------+---------------+---------+-----------+----------+-------------------+  PERO                                                       Not well visualized  +---------+---------------+---------+-----------+----------+-------------------+    Summary: RIGHT: - No evidence of common femoral vein obstruction.  LEFT: - There is no evidence of deep vein thrombosis in the lower extremity. However, portions of this examination were limited- see technologist comments above.  - No cystic structure found in the popliteal fossa.  *See table(s) above for measurements and observations.    Preliminary     Procedures Procedures    Medications Ordered in ED Medications  vancomycin (VANCOCIN) IVPB 1000 mg/200 mL premix (has no administration in time range)     ED Course/ Medical Decision Making/ A&P                           Medical Decision Making Problems Addressed: Cellulitis of left lower extremity: acute illness or injury that poses a threat to life or bodily functions Contusion of multiple sites of left lower extremity, initial encounter: acute illness or injury  Amount and/or Complexity of Data Reviewed Independent Historian:     Details: Patient and daughter External Data Reviewed: notes.    Details: Discovered patient is on Eliquis for atrial fibrillation Labs: ordered. Decision-making details documented in ED Course. Radiology: ordered and independent interpretation performed. Decision-making details documented in ED Course. Discussion of management or test interpretation with external provider(s): Hospitalist  Risk Drug therapy requiring intensive monitoring for toxicity. Decision regarding hospitalization.   Patient is a 76 year old man who had a fall 5 days ago and now has marked swelling to his left leg.  He is on Eliquis.  He denies any head injury associated with the fall.  His leg is markedly swollen, warm and red.  There is no evidence of DVT on ultrasound imaging.  His labs were reviewed by me and are nonconcerning.  X-rays were done and there is no evidence of fracture.  These are interpreted by me.  I am concerned about some underlying possible hematoma versus cellulitis.  We will start patient on vancomycin.  Given his inability to ambulate on the leg, will plan admission for further treatment.  I spoke with the hospitalist who will admit the patient.  Final Clinical Impression(s) / ED Diagnoses Final diagnoses:  Fall, initial encounter  Contusion of multiple sites of left lower extremity, initial encounter  Cellulitis of left lower extremity  Rx / DC Orders ED Discharge Orders     None         Malvin Johns, MD 09/13/21 1016

## 2021-09-13 NOTE — H&P (Signed)
History and Physical    Patient: Shane Diaz CXK:[id number] DOB: [birth year] DOA: 09/13/2021 DOS: the patient was seen and examined on 09/13/2021 PCP: [name], MD  Patient coming from: Home  Chief Complaint:  Chief Complaint  Patient presents with   Fall    HPI: Shane Diaz is a 76 y.o. male with medical history significant of chronic diastolic CHF, EF 97%, OSA, hypertension, atrial fibrillation, on Eliquis, obesity, chronic respiratory failure on home O2 2 L presented to ED with marked swelling of his left lower leg.  History was obtained from the patient who reported that on Monday, 09/09/2020, he was trying to get onto the electric wheelchair when he fell down.  Subsequently he started noticing swelling and redness in his left lower leg from the knee down, progressively worsening.  He developed pain in his lower leg 9/10, sharp and intermittent.  Also having difficulty bearing weight on the left leg due to pain and swelling.  He does have chronic swelling of his lower extremities due to congestive heart failure. No chest pain, nausea vomiting, abdominal pain, diarrhea, fevers.   Review of Systems: As mentioned in the history of present illness. All other systems reviewed and are negative. Past Medical History:  Diagnosis Date   Acute diastolic heart failure ([place]) 08/03/2020   Arthritis    Diverticulosis 05/2015   Mild, noted on colonosocpy   Fatty liver 05/14/2015   noted on [place] ABD   History of colon polyps 05/2014   sessile in ascending colon, pedunculated in sigmoid colon, diverticulosis   Hyperlipidemia    pt denies   Hypertension    not currently taking medications per MD   OSA (obstructive sleep apnea) 08/03/2020   Renal cyst 05/2014   Small, Left, noted on [place] ABD   Umbilical hernia    Varicose veins    Past Surgical History:  Procedure Laterality Date   APPENDECTOMY     childhood   BIOPSY  07/11/2020   Procedure: BIOPSY;  Surgeon: Ladene Artist, MD;   Location: [place];  Service: Endoscopy;;   CARDIOVERSION N/A 07/13/2020   Procedure: CARDIOVERSION;  Surgeon: Josue Hector, MD;  Location: [facility] ENDOSCOPY;  Service: Cardiovascular;  Laterality: N/A;   CARDIOVERSION N/A 08/02/2020   Procedure: CARDIOVERSION;  Surgeon: Werner Lean, MD;  Location: [facility] ENDOSCOPY;  Service: Cardiovascular;  Laterality: N/A;   COLONOSCOPY W/ POLYPECTOMY  05/23/2014   COLONOSCOPY WITH PROPOFOL N/A 07/11/2020   Procedure: COLONOSCOPY WITH PROPOFOL;  Surgeon: Ladene Artist, MD;  Location: [facility] ENDOSCOPY;  Service: Endoscopy;  Laterality: N/A;   ESOPHAGOGASTRODUODENOSCOPY (EGD) WITH PROPOFOL N/A 07/11/2020   Procedure: ESOPHAGOGASTRODUODENOSCOPY (EGD) WITH PROPOFOL;  Surgeon: Ladene Artist, MD;  Location: [facility] ENDOSCOPY;  Service: Endoscopy;  Laterality: N/A;   TEE WITHOUT CARDIOVERSION N/A 07/13/2020   Procedure: TRANSESOPHAGEAL ECHOCARDIOGRAM (TEE);  Surgeon: Josue Hector, MD;  Location: [facility] ENDOSCOPY;  Service: Cardiovascular;  Laterality: N/A;   UMBILICAL HERNIA REPAIR N/A 04/27/2018   Procedure: [place];  Surgeon: Alphonsa Overall, MD;  Location: [facility] ORS;  Service: General;  Laterality: N/A;   varicose veins     WRIST SURGERY     Social History:  reports that he has never smoked. He has never used smokeless tobacco. He reports current alcohol use. He reports that he does not use drugs.  Allergies  Allergen Reactions   Amiodarone Rash    Family History  Problem Relation Age of Onset   Colon cancer Neg Hx  Esophageal cancer Neg Hx    Pancreatic cancer Neg Hx    Prostate cancer Neg Hx    Rectal cancer Neg Hx    Stomach cancer Neg Hx     Prior to Admission medications   Medication Sig Start Date End Date Taking? Authorizing Provider  apixaban (ELIQUIS) 5 MG TABS tablet Take 1 tablet (5 mg total) by mouth 2 (two) times daily. 01/21/21   [name], [name], MD  bumetanide (BUMEX) 2 MG tablet Take 2 tablets (4 mg  total) by mouth daily. 06/18/21   [name], [name], MD  levothyroxine (SYNTHROID) 25 MCG tablet Take 25 mcg by mouth daily before breakfast.    [provider]  Magnesium Oxide 400 MG CAPS Take 1 capsule (400 mg total) by mouth daily. 12/14/20   [name], [name], MD  metoprolol succinate (TOPROL-XL) 50 MG 24 hr tablet Take 1 tablet (50 mg total) by mouth daily. Take with or immediately following a meal. 06/04/21 09/02/21  [name], Dani Gobble, MD  nitroGLYCERIN (NITROSTAT) 0.4 MG SL tablet Place 1 tablet (0.4 mg total) under the tongue every 5 (five) minutes x 3 doses as needed for chest pain. 08/03/20   Isaiah Serge, NP  Potassium Chloride ER 20 MEQ TBCR TAKE ONE TABLET BY MOUTH DAILY *ON THE DAYS THAT YOU TAKE METOLAZONE TAKE AN EXTRA TABLET *40MEQ TOTAL* 08/13/21   [name], Dani Gobble, MD    Physical Exam: Vitals:   09/13/21 0649 09/13/21 0700 09/13/21 0808 09/13/21 1000  BP: 136/81 132/84 122/69 117/79  Pulse: 88 85 85 81  Resp: (!) _0 Temp: 99.3 F (37.4 C)     TempSrc: Oral     SpO2: 92% 96% 98% 95%   Physical Exam General: Alert and oriented x 3, NAD Cardiovascular: S1 S2 clear, RRR Respiratory: CTAB, no wheezing, rales or rhonchi Gastrointestinal: Soft, nontender, nondistended, NBS Ext: marked left lower extremity edema, right lower extremity wrapped  Neuro: no new deficits Skin: Marked swelling and redness left leg from knee down, tenderness, warm to touch Psych: Normal affect and demeanor, alert and oriented x3    Data Reviewed: CBC    Component Value Date/Time   WBC 9.7 09/13/2021 0725   RBC 4.41 09/13/2021 0725   HGB 13.7 09/13/2021 0725   HGB 12.7 (L) 08/23/2020 0925   HCT 44.3 09/13/2021 0725   HCT 41.5 08/23/2020 0925   PLT 200 09/13/2021 0725   PLT 288 08/23/2020 0925   MCV 100.5 (H) 09/13/2021 0725   MCV 88 08/23/2020 0925   MCH 31.1 09/13/2021 0725   MCHC 30.9 09/13/2021 0725   RDW 13.4 09/13/2021 0725   RDW 15.6 (H) 08/23/2020 0925   LYMPHSABS  1.7 09/13/2021 0725   MONOABS 1.2 (H) 09/13/2021 0725   EOSABS 0.4 09/13/2021 0725   BASOSABS 0.1 09/13/2021 0725    BMET    Component Value Date/Time   NA 135 09/13/2021 0725   NA 142 01/10/2021 1536   K 4.0 09/13/2021 0725   CL 92 (L) 09/13/2021 0725   CO2 34 (H) 09/13/2021 0725   GLUCOSE 123 (H) 09/13/2021 0725   BUN 18 09/13/2021 0725   BUN 18 01/10/2021 1536   CREATININE 1.16 09/13/2021 0725   CALCIUM 8.9 09/13/2021 0725   EGFR 60 01/10/2021 1536   GFRNONAA >60 09/13/2021 0725     X-rays of the left foot and left tibia-fibula negative for any fractures  Assessment and Plan: * Cellulitis- left leg- (present on admission) - Left lower extremity  with marked erythema, swelling, warm to touch, tender -Venous Dopplers negative for DVT.  X-rays negative for any fracture -Patient is on Eliquis, will obtain CT of the left lower extremity to rule out any hematoma -Obtain blood cultures, placed on IV vancomycin, cefepime.  Chronic respiratory failure with hypoxia (HCC)- (present on admission) - On 2 L O2 at home at night, will continue  Chronic heart failure with preserved ejection fraction (HFpEF) ([place])- (present on admission) - Mild swelling on the left lower extremity, reviewed cardiology office notes, patient has been on Bumex.  2D echo 03/2021 showed EF of 55%, indeterminate diastolic parameters -Placed on low-sodium diet, will place on IV Lasix 40 mg every 12 hours x3, will resume Bumex in a.m. if edema is improving -Strict I's and O's and daily weights  A-fib ([place])- (present on admission) - Currently rate controlled, continue metoprolol -Hold Eliquis until CT left lower extremity (to rule out any hematoma or bleeding)  Essential hypertension- (present on admission) - BP stable, continue metoprolol  Fall- (present on admission) - Will need PT OT evaluation once edema and cellulitis has improved  Obesity, Class III, BMI 40-49.9 (morbid obesity) ([place])- (present on  admission) - Encouraged lifestyle changes, low-sodium diet  OSA (obstructive sleep apnea)- (present on admission) - On O2 at night, 2 L, continue.  Patient states he is not on CPAP.   Advance Care Planning:   Code Status: Full Code   Consults: None  Family Communication:   Severity of Illness: The appropriate patient status for this patient is INPATIENT. Inpatient status is judged to be reasonable and necessary in order to provide the required intensity of service to ensure the patient's safety. The patient's presenting symptoms, physical exam findings, and initial radiographic and laboratory data in the context of their chronic comorbidities is felt to place them at high risk for further clinical deterioration. Furthermore, it is not anticipated that the patient will be medically stable for discharge from the hospital within 2 midnights of admission.   * I certify that at the point of admission it is my clinical judgment that the patient will require inpatient hospital care spanning beyond 2 midnights from the point of admission due to high intensity of service, high risk for further deterioration and high frequency of surveillance required.*  Author: Estill Cotta, MD 09/13/2021 11:13 AM  For on call review [email or web address].

## 2021-09-13 NOTE — Assessment & Plan Note (Signed)
-   On O2 at night, 2 L, continue.  Patient states he is not on CPAP.

## 2021-09-13 NOTE — Assessment & Plan Note (Addendum)
-  Swelling on the left lower extremity, reviewed cardiology office notes, patient has been on Bumex, also has noncompliance.  2D echo 03/2021 showed EF of 55%, indeterminate diastolic parameters -Negative balance of 3.1 L -Discussed with cardiology, recommended to hold diuresis today, may resume Bumex 4 mg  tomorrow -Patient has appointment with his cardiologist on 09/25/2021 and be met scheduled before that.

## 2021-09-13 NOTE — Assessment & Plan Note (Signed)
-   BP stable, continue metoprolol

## 2021-09-13 NOTE — Assessment & Plan Note (Signed)
-   On 2 L O2 at home at night, will continue

## 2021-09-13 NOTE — Progress Notes (Signed)
Left lower extremity venous duplex has been completed. Preliminary results can be found in CV Proc through chart review.  Results were given to Dr. Tamera Punt.  09/13/21 9:39 AM Shane Diaz RVT

## 2021-09-13 NOTE — Assessment & Plan Note (Signed)
-   Encouraged lifestyle changes, low-sodium diet

## 2021-09-13 NOTE — Assessment & Plan Note (Addendum)
-   Left lower extremity with marked erythema, swelling, warm to touch, tender -Venous Dopplers negative for DVT.  X-rays negative for any fracture -still has significant erythema but swelling improving with IV Lasix -CT of the left lower extremity shows 3.3 x 7.9 cm hematoma and 5.2x 1.8x 6.2 cm also similar appearing possible hematoma.  Eliquis currently held -Will hold off on eliquis, per patient, he has PCP appointment on Monday. Rrepeat CT of the LLE, if hematoma is resolved, may resume Eliquis.   -Patient wants to go home asap as he has dental appointment tomorrow. Will dc on oral doxycycline 100mg  BID for 7 more days.

## 2021-09-13 NOTE — Progress Notes (Signed)
Pharmacy Antibiotic Note  Shane Diaz is a 76 y.o. male admitted on 09/13/2021 with pain in LLE s/p fall 5 days ago with increased swelling and pain at the knee and below.  Pharmacy has been consulted for Cefepime and Vancomycin dosing. Afebrile, WBC WNL 2/10 LE Xray: Severe diffuse soft tissue swelling. No acute osseous abnormality 2/10 Vascular US: no evidence of DVT  Plan: Cefepime 2g IV q8h Vancomycin 1500 mg IV q24h (SCr 1.16, est AUC 501)  Measure Vanc peak and trough as needed. Goal AUC = 400 - 550. Follow up renal function, culture results, and clinical course.   Height: 5\' 7"  (170.2 cm) Weight: 127 kg (280 lb) IBW/kg (Calculated) : 66.1  Temp (24hrs), Avg:99.3 F (37.4 C), Min:99.3 F (37.4 C), Max:99.3 F (37.4 C)  Recent Labs  Lab 09/13/21 0725  WBC 9.7  CREATININE 1.16    CrCl cannot be calculated (Unknown ideal weight.).    Allergies  Allergen Reactions   Amiodarone Rash    Antimicrobials this admission: 2/10 Cefepime >>  2/10 Vancomycin >>   Dose adjustments this admission:   Microbiology results: 2/10 BCx:  2/10 Resp panel:    Thank you for allowing pharmacy to be a part of this patients care.  Gretta Arab PharmD, BCPS Clinical Pharmacist [facility] main pharmacy [phone] 09/13/2021 10:46 AM

## 2021-09-14 DIAGNOSIS — I50813 Acute on chronic right heart failure: Secondary | ICD-10-CM

## 2021-09-14 DIAGNOSIS — T148XXA Other injury of unspecified body region, initial encounter: Secondary | ICD-10-CM | POA: Diagnosis present

## 2021-09-14 DIAGNOSIS — I5033 Acute on chronic diastolic (congestive) heart failure: Secondary | ICD-10-CM

## 2021-09-14 LAB — BASIC METABOLIC PANEL
Anion gap: 9 (ref 5–15)
Anion gap: 11 (ref 5–15)
BUN: 18 mg/dL (ref 8–23)
BUN: 21 mg/dL (ref 8–23)
CO2: 38 mmol/L — ABNORMAL HIGH (ref 22–32)
CO2: 37 mmol/L — ABNORMAL HIGH (ref 22–32)
Calcium: 9.4 mg/dL (ref 8.9–10.3)
Calcium: 9.3 mg/dL (ref 8.9–10.3)
Chloride: 93 mmol/L — ABNORMAL LOW (ref 98–111)
Chloride: 89 mmol/L — ABNORMAL LOW (ref 98–111)
Creatinine, Ser: 1.06 mg/dL (ref 0.61–1.24)
Creatinine, Ser: 1.12 mg/dL (ref 0.61–1.24)
GFR, Estimated: >60 mL/min (ref >60)
GFR, Estimated: >60 mL/min (ref >60)
Glucose, Bld: 123 mg/dL — ABNORMAL HIGH (ref 70–99)
Glucose, Bld: 117 mg/dL — ABNORMAL HIGH (ref 70–99)
Potassium: 3.7 mmol/L (ref 3.5–5.1)
Potassium: 3.5 mmol/L (ref 3.5–5.1)
Sodium: 140 mmol/L (ref 135–145)
Sodium: 137 mmol/L (ref 135–145)

## 2021-09-14 LAB — MAGNESIUM: Magnesium: 1.9 mg/dL (ref 1.7–2.4)

## 2021-09-14 LAB — CBC
HCT: 44 % (ref 39.0–52.0)
Hemoglobin: 13.5 g/dL (ref 13.0–17.0)
MCH: 31 pg (ref 26.0–34.0)
MCHC: 30.7 g/dL (ref 30.0–36.0)
MCV: 101.1 fL — ABNORMAL HIGH (ref 80.0–100.0)
Platelets: 202 10*3/uL (ref 150–400)
RBC: 4.35 MIL/uL (ref 4.22–5.81)
RDW: 13.3 % (ref 11.5–15.5)
WBC: 7.8 10*3/uL (ref 4.0–10.5)
nRBC: 0 % (ref 0.0–0.2)

## 2021-09-14 MED ORDER — FUROSEMIDE 10 MG/ML IJ SOLN
100.0000 mg | Freq: Once | INTRAVENOUS | Status: AC
  Administered 2021-09-14: 100 mg via INTRAVENOUS
  Filled 2021-09-14: qty 10

## 2021-09-14 MED ORDER — FUROSEMIDE 10 MG/ML IJ SOLN
10.0000 mg/h | INTRAVENOUS | Status: DC
  Administered 2021-09-14 – 2021-09-17 (×4): 10 mg/h via INTRAVENOUS
  Filled 2021-09-14 (×5): qty 20

## 2021-09-14 NOTE — Progress Notes (Signed)
Patient incontinent of urine during the night. Patient high fall risk due to meds, unsteady gait, and impulsiveness but patient refuses for staff to be present with him for toilet\ing needs. Patient is alert and oriented x 4.

## 2021-09-14 NOTE — Progress Notes (Signed)
Progress Note  Patient Name: Shane Diaz Date of Encounter: 09/14/2021  [facility] Cardiologist: Sanda Klein, MD   Subjective   Better UOP; net negative 1.2L Cr stable 1.22  Feels better. Asking when he can leave the hospital. Still with massive LLE swelling.  Inpatient Medications    Scheduled Meds:  magnesium oxide  400 mg Oral Daily   metoprolol succinate  50 mg Oral Daily   Continuous Infusions:  ceFEPime (MAXIPIME) IV 2 g (09/14/21 1243)   furosemide (LASIX) 200 mg in dextrose 5% 100 mL (2mg /mL) infusion 10 mg/hr (09/14/21 1518)   vancomycin 1,500 mg (09/14/21 1400)   PRN Meds: acetaminophen **OR** acetaminophen, hydrALAZINE, HYDROcodone-acetaminophen, morphine injection, nitroGLYCERIN, ondansetron **OR** ondansetron (ZOFRAN) IV, polyethylene glycol   Vital Signs    Vitals:   09/14/21 0500 09/14/21 0510 09/14/21 1355 09/14/21 1814  BP:  (!) 146/63 134/63 137/74  Pulse:  92 90 90  Resp:  20 20 18   Temp:  99 F (37.2 C) 99 F (37.2 C) 98.8 F (37.1 C)  TempSrc:  Oral Oral   SpO2:  95% 94% 95%  Weight: (!) 143.9 kg     Height:        Intake/Output Summary (Last 24 hours) at 09/14/2021 2004 Last data filed at 09/14/2021 1900 Gross per 24 hour  Intake 1440 ml  Output 2650 ml  Net -1210 ml   Last 3 Weights 09/14/2021 09/13/2021 06/18/2021  Weight (lbs) 317 lb 3.9 oz 280 lb 319 lb 6.4 oz  Weight (kg) 143.9 kg 127.007 kg 144.879 kg      Telemetry    NSR - Personally Reviewed  ECG    No new tracing - Personally Reviewed  Physical Exam   GEN: Morbidly obese, laying in bed, NAD Neck: JVD difficult to assess due to body habitus Cardiac: Distant. RR, no murmurs Respiratory: Diminished but clear GI: Obese, soft MS: 3+ pitting edema on the left. 1+ on the right. Has erythema of the LLE with area of hematoma below the knee Neuro:  Nonfocal  Psych: Normal affect   Labs    High Sensitivity Troponin:  No results for input(s): TROPONINIHS in the  last 720 hours.   Chemistry Recent Labs  Lab 09/13/21 0725 09/14/21 0516 09/14/21 1815  NA 135 140 137  K 4.0 3.7 3.5  CL 92* 93* 89*  CO2 34* 38* 37*  GLUCOSE 123* 123* 117*  BUN 18 18 21   CREATININE 1.16 1.06 1.12  CALCIUM 8.9 9.4 9.3  MG  --   --  1.9  GFRNONAA >60 >60 >60  ANIONGAP 9 9 11     Lipids No results for input(s): CHOL, TRIG, HDL, LABVLDL, LDLCALC, CHOLHDL in the last 168 hours.  Hematology Recent Labs  Lab 09/13/21 0725 09/14/21 0516  WBC 9.7 7.8  RBC 4.41 4.35  HGB 13.7 13.5  HCT 44.3 44.0  MCV 100.5* 101.1*  MCH 31.1 31.0  MCHC 30.9 30.7  RDW 13.4 13.3  PLT 200 202   Thyroid  Recent Labs  Lab 09/13/21 1903  TSH 2.885    BNPNo results for input(s): BNP, PROBNP in the last 168 hours.  DDimer No results for input(s): DDIMER in the last 168 hours.   Radiology    DG Tibia/Fibula Left  Result Date: 09/13/2021 CLINICAL DATA:  Left lower leg pain and swelling after recent fall. EXAM: LEFT TIBIA AND FIBULA - 2 VIEW COMPARISON:  None. FINDINGS: No acute fracture or dislocation. Severe medial compartment joint space narrowing  of the knee. The left ankle is grossly unremarkable. Severe diffuse soft tissue swelling. IMPRESSION: 1. Severe diffuse soft tissue swelling. No acute osseous abnormality. Electronically Signed   By: Titus Dubin M.D.   On: 09/13/2021 07:59   CT TIBIA FIBULA LEFT WO CONTRAST  Result Date: 09/13/2021 CLINICAL DATA:  Soft tissue infection suspected. Diffuse swelling and erythema. Likely cellulitis however on Eliquis. Evaluate for hematoma or active bleeding. EXAM: CT OF THE LOWER LEFT EXTREMITY WITHOUT CONTRAST TECHNIQUE: Multidetector CT imaging of the lower left extremity was performed according to the standard protocol. RADIATION DOSE REDUCTION: This exam was performed according to the departmental dose-optimization program which includes automated exposure control, adjustment of the mA and/or kV according to patient size and/or use  of iterative reconstruction technique. COMPARISON:  Left tibia and fibula and left foot radiographs 09/13/2021 FINDINGS: Bones/Joint/Cartilage Mild peripheral patellar degenerative osteophytes with mild patellofemoral joint space narrowing. No cortical erosion is seen however within the proximal tibial metaphysis and diaphysis there are lobular regions of low-density with peripheral sclerotic borders. This is at the level of the anteromedial and anterolateral proximal shin soft tissue hematoma. This is a nonspecific appearance on CT and may represent normal intraosseous fat, however differential considerations include an intraosseous lipoma or bone infarct. Given the intact cortices, osteomyelitis is felt less likely. This measures up to approximately 3.6 x 3.2 x 7.2 cm (transverse by AP by craniocaudal as measured on axial image 72 and coronal image 45). There is a smaller but similar region of density also seen within the distal tibial diaphysis, also with intact cortices. Ligaments Suboptimally assessed by CT. Muscles and Tendons Small fat density lipoma versus old injury of the medial head of the gastrocnemius muscle (axial series 4, image 102). Soft tissues Mild-to-moderate knee joint effusion. Moderate to high-grade edema and swelling of the subcutaneous fat of the entire visualized left calf greatest at the medial and lateral aspects. There is also swelling and edema within the visualized knee and proximally visualized dorsal foot. Centered within the deep aspect of the anterolateral proximal shin (at the level of the proximal tibial metadiaphysis subcutaneous fat but also exerting mass effect on the subjacent tibialis anterior and extensor digitorum longus muscles of the anterior compartment of the calf, there is an oval well-circumscribed region with internal density of 64 Hounsfield units measuring up to approximately 6.8 by 3.3 by 7.9 cm (transverse by AP by craniocaudal as measured on axial series 4,  image 83 and coronal series 9, image 31). This is suggestive of a hematoma. There is a smaller similar density well-circumscribed region within the anteromedial proximal calf at the same height measuring up to 5.2 x 1.8 by 6.2 cm (transverse by AP by craniocaudal, as measured on axial image 73 and coronal image 28). There also appears to be similar high density possible hematoma connecting these 2 regions, anterior to the distal patellar tendon. IMPRESSION:: IMPRESSION: 1. Diffuse moderate to high-grade subcutaneous fat edema and swelling throughout the visualized knee, calf, and dorsal foot. 2. Dense collections within the proximal anteromedial and proximal anterolateral shin subcutaneous fat compatible with hematomas. 3. As described above, there are nonspecific regions of lobular low-density with peripheral sclerotic borders within the proximal tibial metadiaphysis and distal tibial diaphysis. This may represent normal intraosseous fat, however differential considerations include an intraosseous lipoma or bone infarct. Given the intact cortices, osteomyelitis is felt less likely. Electronically Signed   By: Yvonne Kendall M.D.   On: 09/13/2021 12:04   DG Foot  Complete Left  Result Date: 09/13/2021 CLINICAL DATA:  Left foot pain and swelling after recent fall. EXAM: LEFT FOOT - COMPLETE 3+ VIEW COMPARISON:  None. FINDINGS: No acute fracture or dislocation. Old healed fracture of the distal second metatarsal. Joint spaces are preserved. Severe diffuse soft tissue swelling. IMPRESSION: 1. No acute osseous abnormality. 2. Severe diffuse soft tissue swelling. Electronically Signed   By: Titus Dubin M.D.   On: 09/13/2021 08:02   VAS [place] LOWER EXTREMITY VENOUS (DVT) (7a-7p)  Result Date: 09/13/2021  Lower Venous DVT Study Patient Name:  KISHON GARRIGA  Date of Exam:   09/13/2021 Medical Rec #: [id number]      Accession #:    [id number] Date of Birth: 1945/09/20      Patient Gender: M Patient Age:   18 years Exam  Location:  [facility] Procedure:      VAS [place] LOWER EXTREMITY VENOUS (DVT) Referring Phys: MELANIE BELFI --------------------------------------------------------------------------------  Indications: Swelling.  Risk Factors: Trauma. Limitations: Body habitus and poor ultrasound/tissue interface. Comparison Study: No prior studies. Performing Technologist: Oliver Hum RVT  Examination Guidelines: A complete evaluation includes B-mode imaging, spectral Doppler, color Doppler, and power Doppler as needed of all accessible portions of each vessel. Bilateral testing is considered an integral part of a complete examination. Limited examinations for reoccurring indications may be performed as noted. The reflux portion of the exam is performed with the patient in reverse Trendelenburg.  +-----+---------------+---------+-----------+----------+--------------+  RIGHT Compressibility Phasicity Spontaneity Properties Thrombus Aging  +-----+---------------+---------+-----------+----------+--------------+  CFV   Full            Yes       Yes                                    +-----+---------------+---------+-----------+----------+--------------+   +---------+---------------+---------+-----------+----------+-------------------+  LEFT      Compressibility Phasicity Spontaneity Properties Thrombus Aging       +---------+---------------+---------+-----------+----------+-------------------+  CFV       Full            Yes       Yes                                         +---------+---------------+---------+-----------+----------+-------------------+  SFJ       Full                                                                  +---------+---------------+---------+-----------+----------+-------------------+  FV Prox   Full                                                                  +---------+---------------+---------+-----------+----------+-------------------+  FV Mid                    Yes       Yes                                          +---------+---------------+---------+-----------+----------+-------------------+  FV Distal                 Yes       Yes                                         +---------+---------------+---------+-----------+----------+-------------------+  PFV       Full                                                                  +---------+---------------+---------+-----------+----------+-------------------+  POP       Full            Yes       Yes                                         +---------+---------------+---------+-----------+----------+-------------------+  PTV       Full                                                                  +---------+---------------+---------+-----------+----------+-------------------+  PERO                                                       Not well visualized  +---------+---------------+---------+-----------+----------+-------------------+    Summary: RIGHT: - No evidence of common femoral vein obstruction.  LEFT: - There is no evidence of deep vein thrombosis in the lower extremity. However, portions of this examination were limited- see technologist comments above.  - No cystic structure found in the popliteal fossa.  *See table(s) above for measurements and observations. Electronically signed by Jamelle Haring on 09/13/2021 at 7:19:42 PM.    Final     Cardiac Studies   DVT study, LLE: 09/13/2021 Summary:  RIGHT:  - No evidence of common femoral vein obstruction.     LEFT:  - There is no evidence of deep vein thrombosis in the lower extremity.  However, portions of this examination were limited- see technologist  comments above.     - No cystic structure found in the popliteal fossa.    ECHO: 03/19/2021  1. Left ventricular ejection fraction, by estimation, is 55%. The left  ventricle has normal function. The left ventricle has no regional wall  motion abnormalities. There is severe left ventricular hypertrophy. Left ventricular  diastolic parameters are indeterminate.   2. Right ventricular systolic function is normal. The right ventricular  size is normal.   3. Left atrial size was moderately dilated.   4. The mitral valve is abnormal. Trivial mitral valve regurgitation. No  evidence of mitral stenosis.   5. The aortic valve is tricuspid. Aortic valve regurgitation is not  visualized. Mild to moderate aortic valve sclerosis/calcification is  present, without  any evidence of aortic stenosis.   6. The inferior vena cava is normal in size with greater than 50%  respiratory variability, suggesting right atrial pressure of 3 mmHg.     Patient Profile     76 y.o. 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 Victorino December notes, the patient has almost exclusively right heart failure due to cor pulmonale. He has been refractory to oral diuretics with recent increase in bumex to 4mg  daily. He was supposed to go to HF clinic for IV diuresis but did not follow-up. He presented on this admission with cellulitis and evidence of gross volume overload. Will need aggressive diuresis. -Continue lasix gtt @10mg /hr -Can dose metolazone prn as well -Continue metop 50mg  XL daily -Start spironlactone 25mg  BID -Trend BID lytes with aggressive diuresis with goal K>4, Mg>2 -Monitor I/Os and daily weights -Low Na diet -Will likely need IV diuresis as out-patient in HF clinic going forward   #Afib: In NSR currently. Apixaban held due to hematoma. -Continue metop 50mg  XL daily -AC held due to  hematoma; resume once able   #LLE Cellulitis: Developed in the setting of massive LE edema -On ABX per IM   #LLE Hematoma: Developed while on apixaban. -Resume AC once able   #DVT: -Resume AC once able and stable from hematoma standpoint   #Morbid Obesity: BMI 50 -Will need continued dietary adjustments and not very mobile at baseline -Consider GLP-1 agonist as out-patient   #OSA: -Has not gone for sleep study despite multiple attempts; will continue to encourgae -Continue supplemental O2 at night     For questions or updates, please contact Minor Please consult [email or web address] for contact info under        Signed, Freada Bergeron, MD  09/14/2021, 8:04 PM

## 2021-09-14 NOTE — Assessment & Plan Note (Addendum)
-  CT of the left lower extremity shows 3.3 x 7.9 cm hematoma and 5.2x 1.8x 6.2 cm also similar appearing possible hematoma. -Eliquis currently on hold.  -Will need repeat CT of the left lower leg to ensure resolution of both hematomas.  He has appointment with his cardiologist, Dr. Sallyanne Kuster and patient reports that he has appointment with his PCP on Monday, 09/23/2021.

## 2021-09-14 NOTE — Progress Notes (Signed)
[facility]                                                                               Shane Diaz, is a 76 y.o. male, DOB - 1946/04/21, OYD:[id number] Admit date - 09/13/2021    Outpatient Primary MD for the patient is Shane Panda, MD  LOS - 1  days    Brief summary   Is a 76 year old male with chronic diastolic CHFchronic diastolic CHF, EF 67%, OSA, hypertension, atrial fibrillation, on Eliquis, obesity, chronic respiratory failure on home O2 2 L presented to ED with marked swelling of his left lower leg.  Reported that he fell on Monday, 09/09/2020 when he was trying to get onto the electric wheelchair.  Subsequently started noticing worsening swelling, redness and pain in his left leg from knee down.  Also has chronic swelling of his lower extremities due to CHF but this was much worse.   Patient was admitted for cellulitis of his left lower extremity.   Assessment & Plan    Assessment and Plan: * Cellulitis- left leg- (present on admission) - Left lower extremity with marked erythema, swelling, warm to touch, tender -Venous Dopplers negative for DVT.  X-rays negative for any fracture -Continue IV vancomycin and cefepime, still has significant erythema but swelling improving with IV Lasix -CT of the left lower extremity shows 3.3 x 7.9 cm hematoma and 5.2x 1.8x 6.2 cm also similar appearing possible hematoma.  Hold off on eliquis  Hematoma- (present on admission) -CT of the left lower extremity shows 3.3 x 7.9 cm hematoma and 5.2x 1.8x 6.2 cm also similar appearing possible hematoma. -  No active bleeding, swelling improving -will hold eliquis.  Chronic respiratory failure with hypoxia (HCC)- (present on admission) - On 2 L O2 at home at night, will continue  Chronic heart failure with preserved ejection fraction (HFpEF) ([place])- (present on admission) - Swelling on the left lower extremity, reviewed cardiology office notes, patient has been on Bumex, also has  noncompliance.  2D echo 03/2021 showed EF of 55%, indeterminate diastolic parameters -Placed on low-sodium diet, continue IV Lasix 40 mg every 8 hours, negative balance of 430 cc  -Strict I's and O's and daily weights -Cardiology consulted  A-fib [facility])- (present on admission) - Currently rate controlled, continue metoprolol - CT of the left lower extremity shows 3.3 x 7.9 cm hematoma and 5.2x 1.8x 6.2 cm also similar appearing possible hematoma.  Hold off on eliquis.  Essential hypertension- (present on admission) - BP stable, continue metoprolol  Fall- (present on admission) - Will need PT OT evaluation once edema and cellulitis has improved  Obesity, Class III, BMI 40-49.9 (morbid obesity) ([place])- (present on admission) - Encouraged lifestyle changes, low-sodium diet  OSA (obstructive sleep apnea)- (present on admission) - On O2 at night, 2 L, continue.  Patient states he is not on CPAP.  Estimated body mass index is 49.69 kg/m as calculated from the following:   Height as of this encounter: 5\' 7"  (1.702 m).   Weight as of this encounter: 143.9 kg.  Code Status: Full CODE STATUS DVT Prophylaxis:    SCDs, Eliquis on hold   Level of Care:  Level of care: Telemetry Family Communication: Called patient's daughter Shane Diaz on phone however unable to make contact.  Disposition Plan:     Remains inpatient appropriate:  still with significant cellulitis, needs IV antibiotics. On IV lasix   Procedures:  CT left tib-fib  Consultants:   Cardiology  Antimicrobials:  Vancomycin 09/13/2021--> IV cefepime 09/13/2021-->    Medications  Scheduled Meds:  furosemide  40 mg Intravenous Q8H   magnesium oxide  400 mg Oral Daily   metoprolol succinate  50 mg Oral Daily   Continuous Infusions:  ceFEPime (MAXIPIME) IV 2 g (09/14/21 0515)   vancomycin     PRN Meds:.acetaminophen **OR** acetaminophen, hydrALAZINE, HYDROcodone-acetaminophen, morphine injection, nitroGLYCERIN,  ondansetron **OR** ondansetron (ZOFRAN) IV, polyethylene glycol    Subjective:   Shane Diaz was seen and examined today.  Swelling of the left leg is improving however still has significant redness from knee down.  No fevers or chills.  Appears much more comfortable today.  No chest pain, dizziness or lightheadedness.   No acute events overnight.    Objective:   Vitals:   09/13/21 2033 09/14/21 0030 09/14/21 0500 09/14/21 0510  BP: 129/73 139/72  (!) 146/63  Pulse: 92 90  92  Resp: 18 (!) 24  20  Temp: 99 F (37.2 C) 98.9 F (37.2 C)  99 F (37.2 C)  TempSrc: Oral Oral  Oral  SpO2: 94% 94%  95%  Weight:   (!) 143.9 kg   Height:        Intake/Output Summary (Last 24 hours) at 09/14/2021 1021 Last data filed at 09/14/2021 0616 Gross per 24 hour  Intake 220 ml  Output 650 ml  Net -430 ml   Filed Weights   09/13/21 1233 09/14/21 0500  Weight: 127 kg (!) 143.9 kg     Exam General: Alert and oriented x 3, NAD Cardiovascular: S1 S2 auscultated, RRR Respiratory: CTA B Gastrointestinal: Soft, nontender, nondistended, + bowel sounds Ext: marked left lower extremity edema Neuro: no new deficits. Skin: Left leg swollen and redness from knee down, warm    Data Reviewed:  I have personally reviewed following labs and imaging studies   CBC Lab Results  Component Value Date   WBC 7.8 09/14/2021   RBC 4.35 09/14/2021   HGB 13.5 09/14/2021   HCT 44.0 09/14/2021   MCV 101.1 (H) 09/14/2021   MCH 31.0 09/14/2021   PLT 202 09/14/2021   MCHC 30.7 09/14/2021   RDW 13.3 09/14/2021   LYMPHSABS 1.7 09/13/2021   MONOABS 1.2 (H) 09/13/2021   EOSABS 0.4 09/13/2021   BASOSABS 0.1 40/34/7425     Last metabolic panel Lab Results  Component Value Date   NA 140 09/14/2021   K 3.7 09/14/2021   CL 93 (L) 09/14/2021   CO2 38 (H) 09/14/2021   BUN 18 09/14/2021   CREATININE 1.06 09/14/2021   GLUCOSE 123 (H) 09/14/2021   GFRNONAA >60 09/14/2021   GFRAA 79 08/23/2020    CALCIUM 9.4 09/14/2021   PHOS 3.2 07/09/2020   PROT 7.1 09/24/2020   ALBUMIN 3.3 (L) 09/24/2020   BILITOT 0.4 09/24/2020   ALKPHOS 104 09/24/2020   AST 30 09/24/2020   ALT 18 09/24/2020   ANIONGAP 9 09/14/2021    CBG (last 3)  No results for input(s): GLUCAP in the last 72 hours.    Coagulation Profile: No results for input(s): INR, PROTIME in the last 168 hours.   Radiology Studies: DG Tibia/Fibula Left  Result Date: 09/13/2021 CLINICAL DATA:  Left lower leg pain and swelling after recent fall. EXAM: LEFT TIBIA AND FIBULA - 2 VIEW COMPARISON:  None. FINDINGS: No acute fracture or dislocation. Severe medial compartment joint space narrowing of the knee. The left ankle is grossly unremarkable. Severe diffuse soft tissue swelling. IMPRESSION: 1. Severe diffuse soft tissue swelling. No acute osseous abnormality. Electronically Signed   By: Titus Dubin M.D.   On: 09/13/2021 07:59   CT TIBIA FIBULA LEFT WO CONTRAST  Result Date: 09/13/2021 CLINICAL DATA:  Soft tissue infection suspected. Diffuse swelling and erythema. Likely cellulitis however on Eliquis. Evaluate for hematoma or active bleeding. EXAM: CT OF THE LOWER LEFT EXTREMITY WITHOUT CONTRAST TECHNIQUE: Multidetector CT imaging of the lower left extremity was performed according to the standard protocol. RADIATION DOSE REDUCTION: This exam was performed according to the departmental dose-optimization program which includes automated exposure control, adjustment of the mA and/or kV according to patient size and/or use of iterative reconstruction technique. COMPARISON:  Left tibia and fibula and left foot radiographs 09/13/2021 FINDINGS: Bones/Joint/Cartilage Mild peripheral patellar degenerative osteophytes with mild patellofemoral joint space narrowing. No cortical erosion is seen however within the proximal tibial metaphysis and diaphysis there are lobular regions of low-density with peripheral sclerotic borders. This is at the level  of the anteromedial and anterolateral proximal shin soft tissue hematoma. This is a nonspecific appearance on CT and may represent normal intraosseous fat, however differential considerations include an intraosseous lipoma or bone infarct. Given the intact cortices, osteomyelitis is felt less likely. This measures up to approximately 3.6 x 3.2 x 7.2 cm (transverse by AP by craniocaudal as measured on axial image 72 and coronal image 45). There is a smaller but similar region of density also seen within the distal tibial diaphysis, also with intact cortices. Ligaments Suboptimally assessed by CT. Muscles and Tendons Small fat density lipoma versus old injury of the medial head of the gastrocnemius muscle (axial series 4, image 102). Soft tissues Mild-to-moderate knee joint effusion. Moderate to high-grade edema and swelling of the subcutaneous fat of the entire visualized left calf greatest at the medial and lateral aspects. There is also swelling and edema within the visualized knee and proximally visualized dorsal foot. Centered within the deep aspect of the anterolateral proximal shin (at the level of the proximal tibial metadiaphysis subcutaneous fat but also exerting mass effect on the subjacent tibialis anterior and extensor digitorum longus muscles of the anterior compartment of the calf, there is an oval well-circumscribed region with internal density of 64 Hounsfield units measuring up to approximately 6.8 by 3.3 by 7.9 cm (transverse by AP by craniocaudal as measured on axial series 4, image 83 and coronal series 9, image 31). This is suggestive of a hematoma. There is a smaller similar density well-circumscribed region within the anteromedial proximal calf at the same height measuring up to 5.2 x 1.8 by 6.2 cm (transverse by AP by craniocaudal, as measured on axial image 73 and coronal image 28). There also appears to be similar high density possible hematoma connecting these 2 regions, anterior to the  distal patellar tendon. IMPRESSION:: IMPRESSION: 1. Diffuse moderate to high-grade subcutaneous fat edema and swelling throughout the visualized knee, calf, and dorsal foot. 2. Dense collections within the proximal anteromedial and proximal anterolateral shin subcutaneous fat compatible with hematomas. 3. As described above, there are nonspecific regions of lobular low-density with peripheral sclerotic borders within the proximal tibial metadiaphysis and distal tibial diaphysis. This may represent normal intraosseous fat, however differential considerations include an  intraosseous lipoma or bone infarct. Given the intact cortices, osteomyelitis is felt less likely. Electronically Signed   By: Yvonne Kendall M.D.   On: 09/13/2021 12:04   DG Foot Complete Left  Result Date: 09/13/2021 CLINICAL DATA:  Left foot pain and swelling after recent fall. EXAM: LEFT FOOT - COMPLETE 3+ VIEW COMPARISON:  None. FINDINGS: No acute fracture or dislocation. Old healed fracture of the distal second metatarsal. Joint spaces are preserved. Severe diffuse soft tissue swelling. IMPRESSION: 1. No acute osseous abnormality. 2. Severe diffuse soft tissue swelling. Electronically Signed   By: Titus Dubin M.D.   On: 09/13/2021 08:02   VAS [place] LOWER EXTREMITY VENOUS (DVT) (7a-7p)  Result Date: 09/13/2021  Lower Venous DVT Study Patient Name:  FESTUS PURSEL  Date of Exam:   09/13/2021 Medical Rec #: [id number]      Accession #:    [id number] Date of Birth: Nov 04, 1945      Patient Gender: M Patient Age:   75 years Exam Location:  [facility] Procedure:      VAS [place] LOWER EXTREMITY VENOUS (DVT) Referring Phys: MELANIE BELFI --------------------------------------------------------------------------------  Indications: Swelling.  Risk Factors: Trauma. Limitations: Body habitus and poor ultrasound/tissue interface. Comparison Study: No prior studies. Performing Technologist: Oliver Hum RVT  Examination Guidelines: A complete  evaluation includes B-mode imaging, spectral Doppler, color Doppler, and power Doppler as needed of all accessible portions of each vessel. Bilateral testing is considered an integral part of a complete examination. Limited examinations for reoccurring indications may be performed as noted. The reflux portion of the exam is performed with the patient in reverse Trendelenburg.  +-----+---------------+---------+-----------+----------+--------------+  RIGHT Compressibility Phasicity Spontaneity Properties Thrombus Aging  +-----+---------------+---------+-----------+----------+--------------+  CFV   Full            Yes       Yes                                    +-----+---------------+---------+-----------+----------+--------------+   +---------+---------------+---------+-----------+----------+-------------------+  LEFT      Compressibility Phasicity Spontaneity Properties Thrombus Aging       +---------+---------------+---------+-----------+----------+-------------------+  CFV       Full            Yes       Yes                                         +---------+---------------+---------+-----------+----------+-------------------+  SFJ       Full                                                                  +---------+---------------+---------+-----------+----------+-------------------+  FV Prox   Full                                                                  +---------+---------------+---------+-----------+----------+-------------------+  FV Mid  Yes       Yes                                         +---------+---------------+---------+-----------+----------+-------------------+  FV Distal                 Yes       Yes                                         +---------+---------------+---------+-----------+----------+-------------------+  PFV       Full                                                                   +---------+---------------+---------+-----------+----------+-------------------+  POP       Full            Yes       Yes                                         +---------+---------------+---------+-----------+----------+-------------------+  PTV       Full                                                                  +---------+---------------+---------+-----------+----------+-------------------+  PERO                                                       Not well visualized  +---------+---------------+---------+-----------+----------+-------------------+    Summary: RIGHT: - No evidence of common femoral vein obstruction.  LEFT: - There is no evidence of deep vein thrombosis in the lower extremity. However, portions of this examination were limited- see technologist comments above.  - No cystic structure found in the popliteal fossa.  *See table(s) above for measurements and observations. Electronically signed by Jamelle Haring on 09/13/2021 at 23:19:42 PM.    Final        Estill Cotta M.D. [facility] 09/14/2021, 10:21 AM  Available via Epic secure chat 7am-7pm After 7 pm, please refer to night coverage provider listed on amion.

## 2021-09-14 NOTE — Progress Notes (Signed)
Patient noncompliant with safety measures, does not let staff help him with mobility or bathing. Patient educated multiple times and will continue to re-educate patient on safety.

## 2021-09-14 NOTE — Hospital Course (Addendum)
Is a 76 year old male with chronic diastolic CHFchronic diastolic CHF, EF 81%, OSA, hypertension, atrial fibrillation, on Eliquis, obesity, chronic respiratory failure on home O2 2 L presented to ED with marked swelling of his left lower leg.  Reported that he fell on Monday, 09/09/2020 when he was trying to get onto the electric wheelchair.  Subsequently started noticing worsening swelling, redness and pain in his left leg from knee down.  Also has chronic swelling of his lower extremities due to CHF but this was much worse.   Patient was admitted for cellulitis of his left lower extremity. Cardiology also consulted for acute on chronic diastolic CHF, RV failure with cor pulmonale

## 2021-09-14 NOTE — Consult Note (Signed)
Cardiology Consultation:   Patient ID: Shane Diaz MRN: [id number]; DOB: July 30, 1946  Admit date: 09/13/2021 Date of Consult: 09/14/2021  PCP:  [name], MD   [facility] Providers Cardiologist:  Sanda Klein, MD        Patient Profile:   Shane Diaz is a 76 y.o. 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  History of Present Illness:   Mr. Abebe was last seen by Dr. Sallyanne Kuster 06/18/2021.  She had been switched from Lasix to torsemide plus metolazone and then Bumex.  Diltiazem was discontinued because of a rash.  Dry weight estimated at 260 pounds, patient was at 319 due to right heart failure, cor pulmonale.  Patient admitted to a very high sodium diet. He was in sinus rhythm.  Plan was for intravenous diuretics through the CHF clinic, but it is not clear that this happened.  He was admitted 2/10.  He had fallen 5 days ago and was having pain and swelling in his leg.  Diagnosed with cellulitis of the left lower extremity as well as a RLE hematoma, and was noted to be volume overloaded as well.  He was placed on IV Lasix and Cards was asked to see.  Currently, the patient remains massively overloaded with significant erythema of the LLE. He denies chest pain or SOB. States he was never seen for out-patient diuresis. Reports being compliant with medications.   Past Medical History:  Diagnosis Date   Acute diastolic heart failure ([place]) 08/03/2020   Arthritis    Diverticulosis 05/2015   Mild, noted on colonosocpy   Fatty liver 05/14/2015   noted on [place] ABD   History of colon polyps 05/2014   sessile in  ascending colon, pedunculated in sigmoid colon, diverticulosis   Hyperlipidemia    pt denies   Hypertension    not currently taking medications per MD   OSA (obstructive sleep apnea) 08/03/2020   Renal cyst 05/2014   Small, Left, noted on [place] ABD   Umbilical hernia    Varicose veins     Past Surgical History:  Procedure Laterality Date   APPENDECTOMY     childhood   BIOPSY  07/11/2020   Procedure: BIOPSY;  Surgeon: Ladene Artist, MD;  Location: [place];  Service: Endoscopy;;   CARDIOVERSION N/A 07/13/2020   Procedure: CARDIOVERSION;  Surgeon: Josue Hector, MD;  Location: [facility] ENDOSCOPY;  Service: Cardiovascular;  Laterality: N/A;   CARDIOVERSION N/A 08/02/2020   Procedure: CARDIOVERSION;  Surgeon: Werner Lean, MD;  Location: [place] ENDOSCOPY;  Service: Cardiovascular;  Laterality: N/A;   COLONOSCOPY W/ POLYPECTOMY  05/23/2014   COLONOSCOPY WITH PROPOFOL N/A 07/11/2020   Procedure: COLONOSCOPY WITH PROPOFOL;  Surgeon: Ladene Artist, MD;  Location: [facility] ENDOSCOPY;  Service: Endoscopy;  Laterality: N/A;   ESOPHAGOGASTRODUODENOSCOPY (EGD) WITH PROPOFOL N/A 07/11/2020   Procedure: ESOPHAGOGASTRODUODENOSCOPY (EGD) WITH PROPOFOL;  Surgeon: Ladene Artist, MD;  Location: [facility] ENDOSCOPY;  Service: Endoscopy;  Laterality: N/A;   TEE WITHOUT CARDIOVERSION N/A 07/13/2020   Procedure: TRANSESOPHAGEAL ECHOCARDIOGRAM (TEE);  Surgeon: Josue Hector, MD;  Location: [facility] ENDOSCOPY;  Service: Cardiovascular;  Laterality: N/A;   UMBILICAL  HERNIA REPAIR N/A 04/27/2018   Procedure: REPAIR UMBILICAL HERNIA ERAS PATHWAY;  Surgeon: Alphonsa Overall, MD;  Location: [facility] ORS;  Service: General;  Laterality: N/A;   varicose veins     WRIST SURGERY       Home Medications:  Prior to Admission medications   Medication Sig Start Date End Date Taking? Authorizing Provider  apixaban (ELIQUIS) 5 MG TABS tablet Take 1 tablet (5 mg total) by mouth 2 (two) times daily. 01/21/21  Yes [name], [name], MD   Potassium Chloride ER 20 MEQ TBCR TAKE ONE TABLET BY MOUTH DAILY *ON THE DAYS THAT YOU TAKE METOLAZONE TAKE AN EXTRA TABLET *40MEQ TOTAL* Patient taking differently: Take 1 tablet by mouth daily. 08/13/21  Yes [name], [name], MD  torsemide (DEMADEX) 20 MG tablet Take 20 mg by mouth 2 (two) times daily. 08/12/21  Yes [provider]  bumetanide (BUMEX) 2 MG tablet Take 2 tablets (4 mg total) by mouth daily. Patient not taking: Reported on 09/13/2021 06/18/21   [name], Dani Gobble, MD  Magnesium Oxide 400 MG CAPS Take 1 capsule (400 mg total) by mouth daily. Patient not taking: Reported on 09/13/2021 12/14/20   [name], Dani Gobble, MD  metoprolol succinate (TOPROL-XL) 50 MG 24 hr tablet Take 1 tablet (50 mg total) by mouth daily. Take with or immediately following a meal. 06/04/21 09/02/21  [name], Dani Gobble, MD  nitroGLYCERIN (NITROSTAT) 0.4 MG SL tablet Place 1 tablet (0.4 mg total) under the tongue every 5 (five) minutes x 3 doses as needed for chest pain. Patient not taking: Reported on 09/13/2021 08/03/20   Isaiah Serge, NP    Inpatient Medications: Scheduled Meds:  magnesium oxide  400 mg Oral Daily   metoprolol succinate  50 mg Oral Daily   Continuous Infusions:  ceFEPime (MAXIPIME) IV 2 g (09/14/21 0515)   furosemide     furosemide (LASIX) 200 mg in dextrose 5% 100 mL (2mg /mL) infusion     vancomycin     PRN Meds: acetaminophen **OR** acetaminophen, hydrALAZINE, HYDROcodone-acetaminophen, morphine injection, nitroGLYCERIN, ondansetron **OR** ondansetron (ZOFRAN) IV, polyethylene glycol  Allergies:    Allergies  Allergen Reactions   Amiodarone Rash    Social History:   Social History   Socioeconomic History   Marital status: Married    Spouse name: Not on file   Number of children: 0   Years of education: Not on file   Highest education level: Not on file  Occupational History   Not on file  Tobacco Use   Smoking status: Never   Smokeless tobacco: Never  Vaping Use    Vaping Use: Never used  Substance and Sexual Activity   Alcohol use: Yes    Alcohol/week: 0.0 standard drinks    Comment: occassional wine, martini   Drug use: No   Sexual activity: Not on file  Other Topics Concern   Not on file  Social History Narrative   Not on file   Social Determinants of Health   Financial Resource Strain: Not on file  Food Insecurity: Not on file  Transportation Needs: Not on file  Physical Activity: Not on file  Stress: Not on file  Social Connections: Not on file  Intimate Partner Violence: Not on file    Family History:   Family History  Problem Relation Age of Onset   Colon cancer Neg Hx    Esophageal cancer Neg Hx    Pancreatic cancer Neg Hx    Prostate cancer Neg Hx    Rectal cancer Neg Hx  Stomach cancer Neg Hx      ROS:  Please see the history of present illness.  All other ROS reviewed and negative.     Physical Exam/Data:   Vitals:   09/13/21 2033 09/14/21 0030 09/14/21 0500 09/14/21 0510  BP: 129/73 139/72  (!) 146/63  Pulse: 92 90  92  Resp: 18 (!) 24  20  Temp: 99 F (37.2 C) 98.9 F (37.2 C)  99 F (37.2 C)  TempSrc: Oral Oral  Oral  SpO2: 94% 94%  95%  Weight:   (!) 143.9 kg   Height:        Intake/Output Summary (Last 24 hours) at 09/14/2021 1059 Last data filed at 09/14/2021 0900 Gross per 24 hour  Intake 460 ml  Output 650 ml  Net -190 ml   Last 3 Weights 09/14/2021 09/13/2021 06/18/2021  Weight (lbs) 317 lb 3.9 oz 280 lb 319 lb 6.4 oz  Weight (kg) 143.9 kg 127.007 kg 144.879 kg     Body mass index is 49.69 kg/m.  General:  Morbidly obese, comfortable HEENT: normal Neck: Unable to assess JVD due to body habitus Vascular: No carotid bruits Cardiac:  Distant. RR, no murmurs appreciated Lungs:  clear to auscultation bilaterally, no wheezing, rhonchi or rales  Abd: Obese, soft Ext: Massive LLE edema with significant erythema and warmth of the LLE. 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     LEFT:  - There is no evidence of deep vein thrombosis in the lower extremity.  However, portions of this examination were limited- see technologist  comments above.     - No cystic structure found in the popliteal fossa.   ECHO: 03/19/2021  1. Left ventricular ejection fraction, by estimation, is 55%. The left  ventricle has normal function. The left ventricle has no regional wall  motion abnormalities. There is severe left ventricular hypertrophy. Left ventricular diastolic parameters are indeterminate.   2. Right ventricular systolic function is normal. The right ventricular  size is normal.   3. Left atrial size was moderately dilated.   4. The mitral valve is abnormal. Trivial mitral valve regurgitation. No  evidence of mitral stenosis.   5. The aortic valve is tricuspid. Aortic valve regurgitation is not  visualized. Mild to moderate aortic valve sclerosis/calcification is  present, without any evidence of aortic stenosis.   6. The inferior vena cava is normal in size with greater than 50%  respiratory variability, suggesting right atrial pressure of 3 mmHg.   Laboratory Data:  High Sensitivity Troponin:  No results for input(s): TROPONINIHS in the last 720 hours.   Chemistry Recent Labs  Lab 09/13/21 0725 09/14/21 0516  NA 135 140  K 4.0 3.7  CL 92* 93*  CO2 34* 38*  GLUCOSE 123* 123*  BUN 18 18  CREATININE 1.16 1.06  CALCIUM 8.9 9.4  GFRNONAA >60 >60  ANIONGAP 9 9    No results for input(s):  PROT, ALBUMIN, AST, ALT, ALKPHOS, BILITOT in the last 168 hours. Lipids  No results for input(s): CHOL, TRIG, HDL, LABVLDL, LDLCALC, CHOLHDL in the last 168 hours.  Hematology Recent Labs  Lab 09/13/21 0725 09/14/21 0516  WBC 9.7 7.8  RBC 4.41 4.35  HGB 13.7 13.5  HCT 44.3 44.0  MCV 100.5* 101.1*  MCH 31.1 31.0  MCHC 30.9 30.7  RDW 13.4 13.3  PLT 200 202   Thyroid  Recent Labs  Lab 09/13/21 1903  TSH 2.885    BNPNo results for input(s): BNP, PROBNP in the last 168 hours.  DDimer No results for input(s): DDIMER in the last 168 hours.   Radiology/Studies:  DG Tibia/Fibula Left  Result Date: 09/13/2021 CLINICAL DATA:  Left lower leg pain and swelling after recent fall. EXAM: LEFT TIBIA AND FIBULA - 2 VIEW COMPARISON:  None. FINDINGS: No acute fracture or dislocation. Severe medial compartment joint space narrowing of the knee. The left ankle is grossly unremarkable. Severe diffuse soft tissue swelling. IMPRESSION: 1. Severe diffuse soft tissue swelling. No acute osseous abnormality. Electronically Signed   By: Titus Dubin M.D.   On: 09/13/2021 07:59   CT TIBIA FIBULA LEFT WO CONTRAST  Result Date: 09/13/2021 CLINICAL DATA:  Soft tissue infection suspected. Diffuse swelling and erythema. Likely cellulitis however on Eliquis. Evaluate for hematoma or active bleeding. EXAM: CT OF THE LOWER LEFT EXTREMITY WITHOUT CONTRAST TECHNIQUE: Multidetector CT imaging of the lower left extremity was performed according to the standard protocol. RADIATION DOSE REDUCTION: This exam was performed according to the departmental dose-optimization program which includes automated exposure control, adjustment of the mA and/or kV according to patient size and/or use of iterative reconstruction technique. COMPARISON:  Left tibia and fibula and left foot radiographs 09/13/2021 FINDINGS: Bones/Joint/Cartilage Mild peripheral patellar degenerative osteophytes with mild patellofemoral joint space narrowing. No  cortical erosion is seen however within the proximal tibial metaphysis and diaphysis there are lobular regions of low-density with peripheral sclerotic borders. This is at the level of the anteromedial and anterolateral proximal shin soft tissue hematoma. This is a nonspecific appearance on CT and may represent normal intraosseous fat, however differential considerations include an intraosseous lipoma or bone infarct. Given the intact cortices, osteomyelitis is felt less likely. This measures up to approximately 3.6 x 3.2 x 7.2 cm (transverse by AP by craniocaudal as measured on axial image 72 and coronal image 45). There is a smaller but similar region of density also seen within the distal tibial diaphysis, also with intact cortices. Ligaments Suboptimally assessed by CT. Muscles and Tendons Small fat density lipoma versus old injury of the medial head of the gastrocnemius muscle (axial series 4, image 102). Soft tissues Mild-to-moderate knee joint effusion. Moderate to high-grade edema and swelling of the subcutaneous fat of the entire visualized left calf greatest at the medial and lateral aspects. There is also swelling and edema within the visualized knee and proximally visualized dorsal foot. Centered within the deep aspect of the anterolateral proximal shin (at the level of the proximal tibial metadiaphysis subcutaneous fat but also exerting mass effect on the subjacent tibialis anterior and extensor digitorum longus muscles of the anterior compartment of the calf, there is an oval well-circumscribed region with internal density of 64 Hounsfield units measuring up to approximately 6.8 by 3.3 by 7.9 cm (transverse by AP by craniocaudal as measured on axial series 4, image 83 and coronal series 9, image 31). This is suggestive of a hematoma. There is a smaller similar density well-circumscribed region within the anteromedial proximal calf at the same height measuring up  to 5.2 x 1.8 by 6.2 cm (transverse by AP  by craniocaudal, as measured on axial image 73 and coronal image 28). There also appears to be similar high density possible hematoma connecting these 2 regions, anterior to the distal patellar tendon. IMPRESSION:: IMPRESSION: 1. Diffuse moderate to high-grade subcutaneous fat edema and swelling throughout the visualized knee, calf, and dorsal foot. 2. Dense collections within the proximal anteromedial and proximal anterolateral shin subcutaneous fat compatible with hematomas. 3. As described above, there are nonspecific regions of lobular low-density with peripheral sclerotic borders within the proximal tibial metadiaphysis and distal tibial diaphysis. This may represent normal intraosseous fat, however differential considerations include an intraosseous lipoma or bone infarct. Given the intact cortices, osteomyelitis is felt less likely. Electronically Signed   By: Yvonne Kendall M.D.   On: 09/13/2021 12:04   DG Foot Complete Left  Result Date: 09/13/2021 CLINICAL DATA:  Left foot pain and swelling after recent fall. EXAM: LEFT FOOT - COMPLETE 3+ VIEW COMPARISON:  None. FINDINGS: No acute fracture or dislocation. Old healed fracture of the distal second metatarsal. Joint spaces are preserved. Severe diffuse soft tissue swelling. IMPRESSION: 1. No acute osseous abnormality. 2. Severe diffuse soft tissue swelling. Electronically Signed   By: Titus Dubin M.D.   On: 09/13/2021 08:02   VAS [place] LOWER EXTREMITY VENOUS (DVT) (7a-7p)  Result Date: 09/13/2021  Lower Venous DVT Study Patient Name:  KRISTINE TILEY  Date of Exam:   09/13/2021 Medical Rec #: [id number]      Accession #:    [id number] Date of Birth: Dec 30, 1945      Patient Gender: M Patient Age:   71 years Exam Location:  [facility] Procedure:      VAS [place] LOWER EXTREMITY VENOUS (DVT) Referring Phys: MELANIE BELFI --------------------------------------------------------------------------------  Indications: Swelling.  Risk Factors: Trauma.  Limitations: Body habitus and poor ultrasound/tissue interface. Comparison Study: No prior studies. Performing Technologist: Oliver Hum RVT  Examination Guidelines: A complete evaluation includes B-mode imaging, spectral Doppler, color Doppler, and power Doppler as needed of all accessible portions of each vessel. Bilateral testing is considered an integral part of a complete examination. Limited examinations for reoccurring indications may be performed as noted. The reflux portion of the exam is performed with the patient in reverse Trendelenburg.  +-----+---------------+---------+-----------+----------+--------------+  RIGHT Compressibility Phasicity Spontaneity Properties Thrombus Aging  +-----+---------------+---------+-----------+----------+--------------+  CFV   Full            Yes       Yes                                    +-----+---------------+---------+-----------+----------+--------------+   +---------+---------------+---------+-----------+----------+-------------------+  LEFT      Compressibility Phasicity Spontaneity Properties Thrombus Aging       +---------+---------------+---------+-----------+----------+-------------------+  CFV       Full            Yes       Yes                                         +---------+---------------+---------+-----------+----------+-------------------+  SFJ       Full                                                                  +---------+---------------+---------+-----------+----------+-------------------+  FV Prox   Full                                                                  +---------+---------------+---------+-----------+----------+-------------------+  FV Mid                    Yes       Yes                                         +---------+---------------+---------+-----------+----------+-------------------+  FV Distal                 Yes       Yes                                          +---------+---------------+---------+-----------+----------+-------------------+  PFV       Full                                                                  +---------+---------------+---------+-----------+----------+-------------------+  POP       Full            Yes       Yes                                         +---------+---------------+---------+-----------+----------+-------------------+  PTV       Full                                                                  +---------+---------------+---------+-----------+----------+-------------------+  PERO                                                       Not well visualized  +---------+---------------+---------+-----------+----------+-------------------+    Summary: RIGHT: - No evidence of common femoral vein obstruction.  LEFT: - There is no evidence of deep vein thrombosis in the lower extremity. However, portions of this examination were limited- see technologist comments above.  - No cystic structure found in the popliteal fossa.  *See table(s) above for measurements and observations. Electronically signed by Jamelle Haring on 09/13/2021 at 7:19:42 PM.    Final      Assessment and Plan:   #RV Failure with Cor Pulmonale: #Acute on Chronic Diastolic HF: Per Dr. Victorino December notes, the patient has almost exclusively right heart failure due to cor pulmonale. He has  been refractory to oral diuretics with recent increase in bumex to 4mg  daily. He was supposed to go to HF clinic for IV diuresis but did not follow-up. He presented on this admission with cellulitis and evidence of gross volume overload. Will need aggressive diuresis. -Bolus lasix 100mg  IV and start on gtt @10mg /hr -Can dose metolazone prn as well -Continue metop 50mg  XL daily -Will start spiro as able -Trend BID lytes with aggressive diuresis with goal K>4, Mg>2 -Monitor I/Os and daily weights -Low Na diet -Will likely need IV diuresis as out-patient in HF clinic going  forward  #Afib: In NSR currently. Apixaban held due to hematoma. -Continue metop 50mg  XL daily -AC held due to hematoma; resume once able  #LLE Cellulitis: Developed in the setting of massive LE edema -On ABX per IM  #LLE Hematoma: Developed while on apixaban. -Resume AC once able  #DVT: -Resume AC once able and stable from hematoma standpoint  #Morbid Obesity: BMI 50 -Will need continued dietary adjustments and not very mobile at baseline -Consider GLP-1 agonist as out-patient  #OSA: -Has not gone for sleep study despite multiple attempts; will continue to encourgae -Continue supplemental O2 at night   Risk Assessment/Risk Scores:       New York Heart Association (NYHA) Functional Class NYHA Class III  CHA2DS2-VASc Score = 5   This indicates a 7.2% annual risk of stroke. The patient's score is based upon: CHF History: 1 HTN History: 1 Diabetes History: 0 Stroke History: 0 Vascular Disease History: 1 Age Score: 2 Gender Score: 0     For questions or updates, please contact [facility] Please consult [email or web address] for contact info under    Signed, Freada Bergeron, MD  09/14/2021 10:59 AM

## 2021-09-15 DIAGNOSIS — G4733 Obstructive sleep apnea (adult) (pediatric): Secondary | ICD-10-CM

## 2021-09-15 LAB — CBC
HCT: 45.5 % (ref 39.0–52.0)
Hemoglobin: 13.8 g/dL (ref 13.0–17.0)
MCH: 30.6 pg (ref 26.0–34.0)
MCHC: 30.3 g/dL (ref 30.0–36.0)
MCV: 100.9 fL — ABNORMAL HIGH (ref 80.0–100.0)
Platelets: 219 10*3/uL (ref 150–400)
RBC: 4.51 MIL/uL (ref 4.22–5.81)
RDW: 13.2 % (ref 11.5–15.5)
WBC: 8.6 10*3/uL (ref 4.0–10.5)
nRBC: 0 % (ref 0.0–0.2)

## 2021-09-15 LAB — BASIC METABOLIC PANEL
Anion gap: 9 (ref 5–15)
Anion gap: 8 (ref 5–15)
BUN: 20 mg/dL (ref 8–23)
BUN: 22 mg/dL (ref 8–23)
CO2: 39 mmol/L — ABNORMAL HIGH (ref 22–32)
CO2: 40 mmol/L — ABNORMAL HIGH (ref 22–32)
Calcium: 9.1 mg/dL (ref 8.9–10.3)
Calcium: 8.9 mg/dL (ref 8.9–10.3)
Chloride: 88 mmol/L — ABNORMAL LOW (ref 98–111)
Chloride: 88 mmol/L — ABNORMAL LOW (ref 98–111)
Creatinine, Ser: 1.22 mg/dL (ref 0.61–1.24)
Creatinine, Ser: 1.41 mg/dL — ABNORMAL HIGH (ref 0.61–1.24)
GFR, Estimated: >60 mL/min (ref >60)
GFR, Estimated: 52 mL/min — ABNORMAL LOW (ref >60)
Glucose, Bld: 121 mg/dL — ABNORMAL HIGH (ref 70–99)
Glucose, Bld: 124 mg/dL — ABNORMAL HIGH (ref 70–99)
Potassium: 3.2 mmol/L — ABNORMAL LOW (ref 3.5–5.1)
Potassium: 4.9 mmol/L (ref 3.5–5.1)
Sodium: 136 mmol/L (ref 135–145)
Sodium: 136 mmol/L (ref 135–145)

## 2021-09-15 LAB — MAGNESIUM
Magnesium: 2 mg/dL (ref 1.7–2.4)
Magnesium: 2.2 mg/dL (ref 1.7–2.4)

## 2021-09-15 MED ORDER — SPIRONOLACTONE 25 MG PO TABS
25.0000 mg | ORAL_TABLET | Freq: Every day | ORAL | Status: DC
  Administered 2021-09-15: 25 mg via ORAL
  Filled 2021-09-15: qty 1

## 2021-09-15 NOTE — Progress Notes (Signed)
[facility]                                                                               Shane Diaz, is a 76 y.o. male, DOB - 1945-09-13, TFT:[id number] Admit date - 09/13/2021    Outpatient Primary MD for the patient is [name], MD  LOS - 2  days    Brief summary   Is a 76 year old male with chronic diastolic CHFchronic diastolic CHF, EF 70%, OSA, hypertension, atrial fibrillation, on Eliquis, obesity, chronic respiratory failure on home O2 2 L presented to ED with marked swelling of his left lower leg.  Reported that he fell on Monday, 09/09/2020 when he was trying to get onto the electric wheelchair.  Subsequently started noticing worsening swelling, redness and pain in his left leg from knee down.  Also has chronic swelling of his lower extremities due to CHF but this was much worse.   Patient was admitted for cellulitis of his left lower extremity. Cardiology also consulted for acute on chronic diastolic CHF, RV failure with cor pulmonale   Assessment & Plan    Assessment and Plan: * Cellulitis- left leg- (present on admission) - Left lower extremity with marked erythema, swelling, warm to touch, tender -Venous Dopplers negative for DVT.  X-rays negative for any fracture -Continue IV vancomycin and cefepime, still has significant erythema but swelling improving with IV Lasix -CT of the left lower extremity shows 3.3 x 7.9 cm hematoma and 5.2x 1.8x 6.2 cm also similar appearing possible hematoma.  Eliquis currently held -Will attempt trial of IV heparin in next 24 to 48 hours if lower extremity swelling continues to improve.  If CBC remains stable, will resume Eliquis.  Hematoma- (present on admission) -CT of the left lower extremity shows 3.3 x 7.9 cm hematoma and 5.2x 1.8x 6.2 cm also similar appearing possible hematoma. -Eliquis currently on hold  Chronic respiratory failure with hypoxia (HCC)- (present on admission) - On 2 L O2 at home at night, will  continue  Chronic heart failure with preserved ejection fraction (HFpEF) ([place])- (present on admission) - Swelling on the left lower extremity, reviewed cardiology office notes, patient has been on Bumex, also has noncompliance.  2D echo 03/2021 showed EF of 55%, indeterminate diastolic parameters -Appreciate cardiology mentations, patient started on Lasix drip at 10 mg/hour, continue metoprolol, added spironolactone.  Negative balance of 1.2 L -Strict I's and O's and daily weights  A-fib ([place])- (present on admission) - Currently rate controlled, continue metoprolol - CT of the left lower extremity shows 3.3 x 7.9 cm hematoma and 5.2x 1.8x 6.2 cm also similar appearing possible hematoma.  Eliquis currently held. - will attempt trial of IV heparin in next 24 to 48 hours if lower extremity swelling continues to improve.  If CBC remained stable, will resume Eliquis.  Essential hypertension- (present on admission) - BP stable, continue metoprolol  Fall- (present on admission) - Will need PT OT evaluation once edema and cellulitis has improved  Obesity, Class III, BMI 40-49.9 (morbid obesity) ([place])- (present on admission) - Encouraged lifestyle changes, low-sodium diet  OSA (obstructive sleep apnea)- (present on admission) - On O2 at night, 2 L,  continue.  Patient states he is not on CPAP.   Estimated body mass index is 49 kg/m as calculated from the following:   Height as of this encounter: 5\' 7"  (1.702 m).   Weight as of this encounter: 141.9 kg.  Code Status:  DVT Prophylaxis:  Place and maintain sequential compression device Start: 09/14/21 1022   Level of Care: Level of care: Telemetry Family Communication: Updated patient  Disposition Plan:     Remains inpatient appropriate: Massive volume overload with 2-3+ pitting edema, cellulitis left lower extremity, on IV antibiotics  Procedures:  None  Consultants:   Cardiology  Antimicrobials:  Vancomycin 09/13/2021--> IV cefepime  09/13/2021-->    Scheduled Meds:  magnesium oxide  400 mg Oral Daily   metoprolol succinate  50 mg Oral Daily   spironolactone  25 mg Oral Daily   Continuous Infusions:  ceFEPime (MAXIPIME) IV 2 g (09/15/21 0553)   furosemide (LASIX) 200 mg in dextrose 5% 100 mL (2mg /mL) infusion 10 mg/hr (09/15/21 1006)   vancomycin 1,500 mg (09/14/21 1400)     Subjective:   Shane Diaz was seen and examined today.  Hoping to go home soon, lower extremity swelling is improving.  Still has significant redness.  No fevers or chills.  No acute chest pain or shortness of breath.   Objective:   Vitals:   09/14/21 1814 09/14/21 2041 09/15/21 0500 09/15/21 0551  BP: 137/74 127/70  115/70  Pulse: 90 85  86  Resp: 18 18  20   Temp: 98.8 F (37.1 C) 98.9 F (37.2 C)  97.6 F (36.4 C)  TempSrc:  Oral  Oral  SpO2: 95% 99%  96%  Weight:   (!) 141.9 kg   Height:        Intake/Output Summary (Last 24 hours) at 09/15/2021 1309 Last data filed at 09/15/2021 1200 Gross per 24 hour  Intake 1608.5 ml  Output 2700 ml  Net -1091.5 ml   Filed Weights   09/13/21 1233 09/14/21 0500 09/15/21 0500  Weight: 127 kg (!) 143.9 kg (!) 141.9 kg     Exam General: Alert and oriented x 3, NAD Cardiovascular: S1 S2 auscultated RRR Respiratory: Diminished breath sounds at the bases Gastrointestinal: Obese, soft, nontender, nondistended, + bowel sounds Ext: 2-3+ pitting edema on the left, 1+ edema on the right.  Marked erythema left lower extremity Neuro: no new deficits Skin: Erythema and cellulitis of the left lower extremity   Data Reviewed:  I have personally reviewed following labs and imaging studies   CBC Lab Results  Component Value Date   WBC 8.6 09/15/2021   RBC 4.51 09/15/2021   HGB 13.8 09/15/2021   HCT 45.5 09/15/2021   MCV 100.9 (H) 09/15/2021   MCH 30.6 09/15/2021   PLT 219 09/15/2021   MCHC 30.3 09/15/2021   RDW 13.2 09/15/2021   LYMPHSABS 1.7 09/13/2021   MONOABS 1.2 (H) 09/13/2021    EOSABS 0.4 09/13/2021   BASOSABS 0.1 65/68/1275     Last metabolic panel Lab Results  Component Value Date   NA 136 09/15/2021   K 3.2 (L) 09/15/2021   CL 88 (L) 09/15/2021   CO2 39 (H) 09/15/2021   BUN 20 09/15/2021   CREATININE 1.22 09/15/2021   GLUCOSE 121 (H) 09/15/2021   GFRNONAA >60 09/15/2021   GFRAA 79 08/23/2020   CALCIUM 9.1 09/15/2021   PHOS 3.2 07/09/2020   PROT 7.1 09/24/2020   ALBUMIN 3.3 (L) 09/24/2020   BILITOT 0.4 09/24/2020   ALKPHOS 104  09/24/2020   AST 30 09/24/2020   ALT 18 09/24/2020   ANIONGAP 9 09/15/2021     Nyles Mitton M.D. [facility] 09/15/2021, 1:09 PM  Available via Epic secure chat 7am-7pm After 7 pm, please refer to night coverage provider listed on amion.

## 2021-09-15 NOTE — Plan of Care (Signed)
°  Problem: Clinical Measurements: Goal: Ability to avoid or minimize complications of infection will improve Outcome: Progressing   Problem: Education: Goal: Knowledge of General Education information will improve Description: Including pain rating scale, medication(s)/side effects and non-pharmacologic comfort measures Outcome: Progressing   Problem: Pain Managment: Goal: General experience of comfort will improve Outcome: Progressing   Problem: Safety: Goal: Ability to remain free from injury will improve Outcome: Progressing

## 2021-09-16 DIAGNOSIS — I5033 Acute on chronic diastolic (congestive) heart failure: Secondary | ICD-10-CM

## 2021-09-16 DIAGNOSIS — E875 Hyperkalemia: Secondary | ICD-10-CM | POA: Diagnosis present

## 2021-09-16 LAB — BASIC METABOLIC PANEL
Anion gap: 9 (ref 5–15)
BUN: 21 mg/dL (ref 8–23)
CO2: 37 mmol/L — ABNORMAL HIGH (ref 22–32)
Calcium: 8.7 mg/dL — ABNORMAL LOW (ref 8.9–10.3)
Chloride: 88 mmol/L — ABNORMAL LOW (ref 98–111)
Creatinine, Ser: 1.53 mg/dL — ABNORMAL HIGH (ref 0.61–1.24)
GFR, Estimated: 47 mL/min — ABNORMAL LOW (ref >60)
Glucose, Bld: 139 mg/dL — ABNORMAL HIGH (ref 70–99)
Potassium: 5.2 mmol/L — ABNORMAL HIGH (ref 3.5–5.1)
Sodium: 134 mmol/L — ABNORMAL LOW (ref 135–145)

## 2021-09-16 LAB — MAGNESIUM: Magnesium: 2 mg/dL (ref 1.7–2.4)

## 2021-09-16 MED ORDER — DOXYCYCLINE HYCLATE 100 MG PO TABS
100.0000 mg | ORAL_TABLET | Freq: Two times a day (BID) | ORAL | Status: DC
  Administered 2021-09-16 – 2021-09-17 (×3): 100 mg via ORAL
  Filled 2021-09-16 (×5): qty 1

## 2021-09-16 MED ORDER — SODIUM CHLORIDE 0.9 % IV SOLN
2.0000 g | INTRAVENOUS | Status: DC
  Administered 2021-09-16: 2 g via INTRAVENOUS
  Filled 2021-09-16 (×2): qty 20

## 2021-09-16 MED ORDER — SODIUM CHLORIDE 0.9 % IV SOLN: 2.0000 g | Freq: Two times a day (BID) | INTRAVENOUS | Status: DC

## 2021-09-16 MED ORDER — SODIUM ZIRCONIUM CYCLOSILICATE 10 G PO PACK
10.0000 g | PACK | Freq: Once | ORAL | Status: AC
  Administered 2021-09-16: 10 g via ORAL
  Filled 2021-09-16: qty 1

## 2021-09-16 MED ORDER — VANCOMYCIN VARIABLE DOSE PER UNSTABLE RENAL FUNCTION (PHARMACIST DOSING): Status: DC

## 2021-09-16 NOTE — Progress Notes (Addendum)
Progress Note  Patient Name: Shane Diaz Date of Encounter: 09/16/2021  [facility] Cardiologist: Sanda Klein, MD   Subjective   No chest pain, no SOB and he feels his leg swelling is improved.  Inpatient Medications    Scheduled Meds:  doxycycline  100 mg Oral Q12H   magnesium oxide  400 mg Oral Daily   metoprolol succinate  50 mg Oral Daily   Continuous Infusions:  cefTRIAXone (ROCEPHIN)  IV     furosemide (LASIX) 200 mg in dextrose 5% 100 mL (2mg /mL) infusion 10 mg/hr (09/16/21 0538)   PRN Meds: acetaminophen **OR** acetaminophen, hydrALAZINE, HYDROcodone-acetaminophen, morphine injection, nitroGLYCERIN, ondansetron **OR** ondansetron (ZOFRAN) IV, polyethylene glycol   Vital Signs    Vitals:   09/15/21 1436 09/15/21 1938 09/16/21 0603 09/16/21 0847  BP: 136/81 123/71 127/75 127/72  Pulse: 85 78 80 78  Resp:  20 19   Temp: 97.9 F (36.6 C) 97.9 F (36.6 C) 98.9 F (37.2 C)   TempSrc: Oral Oral Oral   SpO2: 97% 98% 97%   Weight:   (!) 141.6 kg   Height:        Intake/Output Summary (Last 24 hours) at 09/16/2021 1241 Last data filed at 09/16/2021 0730 Gross per 24 hour  Intake 643.17 ml  Output 1450 ml  Net -806.83 ml   Last 3 Weights 09/16/2021 09/15/2021 09/14/2021  Weight (lbs) 312 lb 2.7 oz 312 lb 13.3 oz 317 lb 3.9 oz  Weight (kg) 141.6 kg 141.9 kg 143.9 kg      Telemetry    SR - Personally Reviewed  ECG    No new - Personally Reviewed  Physical Exam   GEN: No acute distress.   Neck: No JVD though neck size makes it difficult Cardiac: RRR, no murmurs, rubs, or gallops.  Respiratory: Clear to diminished to auscultation bilaterally. GI: Soft, nontender, non-distended  MS: 2+ edema; No deformity. Neuro:  Nonfocal  Psych: Normal affect   Labs    High Sensitivity Troponin:  No results for input(s): TROPONINIHS in the last 720 hours.   Chemistry Recent Labs  Lab 09/15/21 0507 09/15/21 1643 09/16/21 0636  NA 136 136 134*  K 3.2*  4.9 5.2*  CL 88* 88* 88*  CO2 39* 40* 37*  GLUCOSE 121* 124* 139*  BUN 20 22 21   CREATININE 1.22 1.41* 1.53*  CALCIUM 9.1 8.9 8.7*  MG 2.0 2.2 2.0  GFRNONAA >60 52* 47*  ANIONGAP 9 8 9     Lipids No results for input(s): CHOL, TRIG, HDL, LABVLDL, LDLCALC, CHOLHDL in the last 168 hours.  Hematology Recent Labs  Lab 09/13/21 0725 09/14/21 0516 09/15/21 0507  WBC 9.7 7.8 8.6  RBC 4.41 4.35 4.51  HGB 13.7 13.5 13.8  HCT 44.3 44.0 45.5  MCV 100.5* 101.1* 100.9*  MCH 31.1 31.0 30.6  MCHC 30.9 30.7 30.3  RDW 13.4 13.3 13.2  PLT 200 202 219   Thyroid  Recent Labs  Lab 09/13/21 1903  TSH 2.885    BNPNo results for input(s): BNP, PROBNP in the last 168 hours.  DDimer No results for input(s): DDIMER in the last 168 hours.   Radiology    No results found.  Cardiac Studies   DVT study, LLE: 09/13/2021 Summary:  RIGHT:  - No evidence of common femoral vein obstruction.     LEFT:  - There is no evidence of deep vein thrombosis in the lower extremity.  However, portions of this examination were limited- see technologist  comments above.     -  No cystic structure found in the popliteal fossa.    ECHO: 03/19/2021  1. Left ventricular ejection fraction, by estimation, is 55%. The left  ventricle has normal function. The left ventricle has no regional wall  motion abnormalities. There is severe left ventricular hypertrophy. Left ventricular diastolic parameters are indeterminate.   2. Right ventricular systolic function is normal. The right ventricular  size is normal.   3. Left atrial size was moderately dilated.   4. The mitral valve is abnormal. Trivial mitral valve regurgitation. No  evidence of mitral stenosis.   5. The aortic valve is tricuspid. Aortic valve regurgitation is not  visualized. Mild to moderate aortic valve sclerosis/calcification is  present, without any evidence of aortic stenosis.   6. The inferior vena cava is normal in size with greater than 50%   respiratory variability, suggesting right atrial pressure of 3 mmHg.     Patient Profile     76 y.o. 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 Victorino December notes, the patient has almost exclusively right heart failure due to cor pulmonale. He has been refractory to oral diuretics with recent increase in bumex to 4mg  daily. He was supposed to go to HF clinic for IV diuresis but did not follow-up. He presented on this admission with cellulitis and evidence of gross volume overload--now has improved since admit  . -Continue lasix gtt @10mg /hr ? Change to po with elevated CR and K+  -Can dose metolazone prn as well -Continue metop 50mg  XL daily -Start spironlactone 25mg  BID  K+ is 5.2 today and spironolactone stopped  and Cr up slightly at 1.53 from 1.31 (was 1.16 on admit) -Trend BID lytes with aggressive diuresis with goal K>4, Mg>2 -Monitor I/Os and daily weights  neg 2,028 since admit wt down from 143.9 kg to 141.6   -Low Na diet -Will likely need IV diuresis as out-patient in HF clinic going forward   #Afib: In NSR currently. Apixaban held due to hematoma. -Continue metop 50mg  XL daily -AC held due to hematoma; resume once able   #LLE Cellulitis: Developed in the setting of massive LE edema -On ABX per IM   #LLE Hematoma: Developed while on apixaban. -Resume AC once able IM will try IV heparin first for trial of anticoagulation   #DVT: -Resume AC once able and stable from hematoma standpoint   #Morbid Obesity: BMI 50 -Will need continued dietary adjustments and  not very mobile at baseline -Consider GLP-1 agonist as out-patient   #OSA: -Has not gone for sleep study despite multiple attempts; will continue to encourgae -Continue supplemental O2 at night        For questions or updates, please contact [place] Please consult [email or web address] for contact info under        Signed, Cecilie Kicks, NP  09/16/2021, 12:41 PM     History and all data above reviewed.  Patient examined.  I agree with the findings as above.  No chest pain.  Breathing is OK.   The patient exam reveals COR:RRR  ,  Lungs: Clear  ,  Abd: Positive bowel  sounds, no rebound no guarding, Ext Left leg hematoma and bilateral leg edema.    .  All available labs, radiology testing, previous records reviewed. Agree with documented assessment and plan. Anasarca:  Starting to run up against the increased creatinine.  However, still with significant volume overload.  I think we should continue the IV Lasix today.  I will not dose Zaroxolyn.  He wants to get out of the hospital tomorrow because he has a dental appt on Wed.  We will shoot for a Wed discharge.   Jeneen Rinks Daziya Redmond  4:08 PM  09/16/2021

## 2021-09-16 NOTE — Assessment & Plan Note (Addendum)
-   K3.4, replaced

## 2021-09-16 NOTE — Progress Notes (Signed)
Pt refusing blood draw. Explained to pt the importance of these labs this evening. Pt then said he has already given blood and then he was scared of the phlebotomist. I explained she is very good and it can be done fast. Pt agreed at that time. Phlebotomist then let me know she could not draw on him due to pt telling her specifically where to draw blood and not allowing her to draw from where she sees a vein.

## 2021-09-16 NOTE — Progress Notes (Signed)
[facility]                                                                               Shane Diaz, is a 76 y.o. male, DOB - 06-12-1946, MCN:[id number] Admit date - 09/13/2021    Outpatient Primary MD for the patient is [name], MD  LOS - 3  days    Brief summary   Is a 76 year old male with chronic diastolic CHFchronic diastolic CHF, EF 62%, OSA, hypertension, atrial fibrillation, on Eliquis, obesity, chronic respiratory failure on home O2 2 L presented to ED with marked swelling of his left lower leg.  Reported that he fell on Monday, 09/09/2020 when he was trying to get onto the electric wheelchair.  Subsequently started noticing worsening swelling, redness and pain in his left leg from knee down.  Also has chronic swelling of his lower extremities due to CHF but this was much worse.   Patient was admitted for cellulitis of his left lower extremity. Cardiology also consulted for acute on chronic diastolic CHF, RV failure with cor pulmonale   Assessment & Plan    Assessment and Plan: * Cellulitis- left leg- (present on admission) - Left lower extremity with marked erythema, swelling, warm to touch, tender -Venous Dopplers negative for DVT.  X-rays negative for any fracture -still has significant erythema but swelling improving with IV Lasix -CT of the left lower extremity shows 3.3 x 7.9 cm hematoma and 5.2x 1.8x 6.2 cm also similar appearing possible hematoma.  Eliquis currently held -Will attempt trial of IV heparin in next 24 to 48 hours if LE swelling continues to improve.  If CBC remains stable, will resume Eliquis, follow H&H in a.m. -Narrow antibiotics to doxycycline and ceftriaxone.  Hyperkalemia- (present on admission) - Potassium 5.2, will give Lokelma x1  Hematoma- (present on admission) -CT of the left lower extremity shows 3.3 x 7.9 cm hematoma and 5.2x 1.8x 6.2 cm also similar appearing possible hematoma. -Eliquis currently on hold  Chronic  respiratory failure with hypoxia (HCC)- (present on admission) - On 2 L O2 at home at night, will continue  Chronic heart failure with preserved ejection fraction (HFpEF) ([place])- (present on admission) - Swelling on the left lower extremity, reviewed cardiology office notes, patient has been on Bumex, also has noncompliance.  2D echo 03/2021 showed EF of 55%, indeterminate diastolic parameters -Cardiology consulted, started on IV Lasix drip, Aldactone -Sodium down 134, potassium at 5.2, creatinine trended up to 1.5, will hold Lasix drip and Aldactone and await further cardiology recommendations. -Negative balance of 1.8 L  A-fib ([place])- (present on admission) - Currently rate controlled, continue metoprolol - CT of the left lower extremity shows 3.3 x 7.9 cm hematoma and 5.2x 1.8x 6.2 cm also similar appearing possible hematoma.  Eliquis currently held. - will attempt trial of IV heparin in next 24 to 48 hours if lower extremity swelling continues to improve.  If CBC remained stable, will resume Eliquis.  Essential hypertension- (present on admission) - BP stable, continue metoprolol  Fall- (present on admission) - PT evaluation  Obesity, Class III, BMI 40-49.9 (morbid obesity) ([place])- (present on admission) - Encouraged lifestyle changes, low-sodium diet  OSA (obstructive sleep apnea)- (present on admission) - On O2 at night, 2 L, continue.  Patient states he is not on CPAP.   Estimated body mass index is 48.89 kg/m as calculated from the following:   Height as of this encounter: 5\' 7"  (1.702 m).   Weight as of this encounter: 141.6 kg.  Code Status: Full CODE STATUS DVT Prophylaxis:  Place and maintain sequential compression device Start: 09/14/21 1022   Level of Care: Level of care: Telemetry Family Communication: Updated patient's wife at the bedside  Disposition Plan:     Remains inpatient appropriate: On IV Lasix for diuresis  Procedures:    Consultants:    Cardiology  Antimicrobials:  IV cefepime, IV vancomycin 09/13/2021-09/16/2021 IV Rocephin 09/16/2021--> Oral doxycycline 09/16/2021--->    Medications   doxycycline  100 mg Oral Q12H   magnesium oxide  400 mg Oral Daily   metoprolol succinate  50 mg Oral Daily     Subjective:   Shane Diaz was seen and examined today.  Hoping to go home soon.  Left leg swelling improving has hematoma lateral below-knee.  Still significant erythema, has pain on bearing weight.  No shortness of breath, nausea vomiting.    Objective:   Vitals:   09/15/21 1938 09/16/21 0603 09/16/21 0847 09/16/21 1319  BP: 123/71 127/75 127/72 118/67  Pulse: 78 80 78 80  Resp: 20 19  18   Temp: 97.9 F (36.6 C) 98.9 F (37.2 C)  98.8 F (37.1 C)  TempSrc: Oral Oral  Oral  SpO2: 98% 97%  96%  Weight:  (!) 141.6 kg    Height:        Intake/Output Summary (Last 24 hours) at 09/16/2021 1506 Last data filed at 09/16/2021 1300 Gross per 24 hour  Intake 1123.17 ml  Output 1750 ml  Net -626.83 ml   Filed Weights   09/14/21 0500 09/15/21 0500 09/16/21 0603  Weight: (!) 143.9 kg (!) 141.9 kg (!) 141.6 kg     Exam General: Alert and oriented x 3, NAD Cardiovascular: S1 S2 auscultated, RRR Respiratory: Diminished breath sound at the bases Gastrointestinal: Soft, nontender, nondistended, + bowel sounds Ext: 2+ pitting edema on the left, 1+ edema on the right.  Marked redness left lower extremity Neuro: no new FND's   Data Reviewed:  I have personally reviewed following labs    CBC Lab Results  Component Value Date   WBC 8.6 09/15/2021   RBC 4.51 09/15/2021   HGB 13.8 09/15/2021   HCT 45.5 09/15/2021   MCV 100.9 (H) 09/15/2021   MCH 30.6 09/15/2021   PLT 219 09/15/2021   MCHC 30.3 09/15/2021   RDW 13.2 09/15/2021   LYMPHSABS 1.7 09/13/2021   MONOABS 1.2 (H) 09/13/2021   EOSABS 0.4 09/13/2021   BASOSABS 0.1 16/05/9603     Last metabolic panel Lab Results  Component Value Date   NA 134  (L) 09/16/2021   K 5.2 (H) 09/16/2021   CL 88 (L) 09/16/2021   CO2 37 (H) 09/16/2021   BUN 21 09/16/2021   CREATININE 1.53 (H) 09/16/2021   GLUCOSE 139 (H) 09/16/2021   GFRNONAA 47 (L) 09/16/2021   GFRAA 79 08/23/2020   CALCIUM 8.7 (L) 09/16/2021   PHOS 3.2 07/09/2020   PROT 7.1 09/24/2020   ALBUMIN 3.3 (L) 09/24/2020   BILITOT 0.4 09/24/2020   ALKPHOS 104 09/24/2020   AST 30 09/24/2020   ALT 18 09/24/2020   ANIONGAP 9 09/16/2021      Sinjin Amero M.D.  [facility] 09/16/2021, 3:06 PM  Available via Epic secure chat 7am-7pm After 7 pm, please refer to night coverage provider listed on amion.

## 2021-09-16 NOTE — Progress Notes (Signed)
Pharmacy Antibiotic Note  Shane Diaz is a 76 y.o. male admitted on 09/13/2021 with pain in LLE s/p fall 5 days ago with increased swelling and pain at the knee and below.  Pharmacy was consulted for Cefepime and Vancomycin dosing.  Narrowing today with rising SCr and improving cellulitis.   Plan: Pharmacy to sign off Ceftriaxone 2g IV q24h.  Doxycycline 100mg  PO BID.   Height: 5\' 7"  (170.2 cm) Weight: (!) 141.6 kg (312 lb 2.7 oz) IBW/kg (Calculated) : 66.1  Temp (24hrs), Avg:98.2 F (36.8 C), Min:97.9 F (36.6 C), Max:98.9 F (37.2 C)  Recent Labs  Lab 09/13/21 0725 09/14/21 0516 09/14/21 1815 09/15/21 0507 09/15/21 1643 09/16/21 0636  WBC 9.7 7.8  --  8.6  --   --   CREATININE 1.16 1.06 1.12 1.22 1.41* 1.53*     Estimated Creatinine Clearance: 56.8 mL/min (A) (by C-G formula based on SCr of 1.53 mg/dL (H)).    Allergies  Allergen Reactions   Amiodarone Rash    Antimicrobials this admission: 2/10 Cefepime >> 2/13 2/10 Vancomycin >> 2/13 2/13 Ceftriaxone >>  2/13 Doxycycline >>   Dose adjustments this admission: 2/13 Empiric renal dose adjustment of antibiotics  Microbiology results: 2/10 BCx: ngtd 2/10 Resp panel: covid neg, influenza neg   Thank you for allowing pharmacy to be a part of this patients care.  Gretta Arab PharmD, BCPS Clinical Pharmacist [facility] main pharmacy [phone] 09/16/2021 8:21 AM

## 2021-09-17 ENCOUNTER — Other Ambulatory Visit: Payer: Self-pay | Admitting: Physician Assistant

## 2021-09-17 DIAGNOSIS — N179 Acute kidney failure, unspecified: Secondary | ICD-10-CM

## 2021-09-17 DIAGNOSIS — I5033 Acute on chronic diastolic (congestive) heart failure: Secondary | ICD-10-CM

## 2021-09-17 LAB — CBC
HCT: 44.2 % (ref 39.0–52.0)
Hemoglobin: 14 g/dL (ref 13.0–17.0)
MCH: 31.5 pg (ref 26.0–34.0)
MCHC: 31.7 g/dL (ref 30.0–36.0)
MCV: 99.5 fL (ref 80.0–100.0)
Platelets: 209 10*3/uL (ref 150–400)
RBC: 4.44 MIL/uL (ref 4.22–5.81)
RDW: 13 % (ref 11.5–15.5)
WBC: 8.4 10*3/uL (ref 4.0–10.5)
nRBC: 0 % (ref 0.0–0.2)

## 2021-09-17 LAB — BASIC METABOLIC PANEL
Anion gap: 9 (ref 5–15)
BUN: 26 mg/dL — ABNORMAL HIGH (ref 8–23)
CO2: 39 mmol/L — ABNORMAL HIGH (ref 22–32)
Calcium: 9.1 mg/dL (ref 8.9–10.3)
Chloride: 84 mmol/L — ABNORMAL LOW (ref 98–111)
Creatinine, Ser: 1.9 mg/dL — ABNORMAL HIGH (ref 0.61–1.24)
GFR, Estimated: 36 mL/min — ABNORMAL LOW (ref >60)
Glucose, Bld: 145 mg/dL — ABNORMAL HIGH (ref 70–99)
Potassium: 3.4 mmol/L — ABNORMAL LOW (ref 3.5–5.1)
Sodium: 132 mmol/L — ABNORMAL LOW (ref 135–145)

## 2021-09-17 LAB — MAGNESIUM: Magnesium: 2.8 mg/dL — ABNORMAL HIGH (ref 1.7–2.4)

## 2021-09-17 MED ORDER — DOXYCYCLINE HYCLATE 100 MG PO TABS: 100.0000 mg | ORAL_TABLET | Freq: Two times a day (BID) | ORAL | 0 refills | Status: AC

## 2021-09-17 MED ORDER — APIXABAN 5 MG PO TABS: 5.0000 mg | ORAL_TABLET | Freq: Two times a day (BID) | ORAL | 1 refills | Status: DC

## 2021-09-17 MED ORDER — BUMETANIDE 2 MG PO TABS: 4.0000 mg | ORAL_TABLET | Freq: Every day | ORAL | 2 refills | Status: DC

## 2021-09-17 NOTE — Discharge Summary (Signed)
Physician Discharge Summary   Patient: Shane Diaz MRN: [id number] DOB: 1945-09-14  Admit date:     09/13/2021  Discharge date: 09/17/21  Discharge Physician: Estill Cotta   PCP: [name], MD   Recommendations at discharge:   Recommended to hold diuretic today.  Resume Bumex 4 mg tomorrow on 2/15.  BMET scheduled on Thursday, 09/19/2021 Eliquis currently on hold due to 2 hematomas noted on the CT left leg (patient had a fall and then developed cellulitis).  Patient reports that he has a follow-up appointment with his PCP on Monday 2/20 and also has scheduled appointment with his cardiologist, Dr. Sallyanne Kuster on 2/22.  Patient needs to have CT of the left leg to ensure resolution of the hematomas and then resume Eliquis Doxycycline 100 mg twice daily for 7 more days  Discharge Diagnoses:    Cellulitis- left leg   Essential hypertension   A-fib (HCC)   Chronic heart failure with preserved ejection fraction (HFpEF) (HCC)   Chronic respiratory failure with hypoxia (HCC)   Hematomas x 2 in LLE   Hyperkalemia   OSA (obstructive sleep apnea)   Obesity, Class III, BMI 40-49.9 (morbid obesity) ([place])   Fall   Acute on chronic diastolic HF (heart failure) [facility])     Hospital Course: Is a 76 year old male with chronic diastolic CHFchronic diastolic CHF, EF 74%, OSA, hypertension, atrial fibrillation, on Eliquis, obesity, chronic respiratory failure on home O2 2 L presented to ED with marked swelling of his left lower leg.  Reported that he fell on Monday, 09/09/2020 when he was trying to get onto the electric wheelchair.  Subsequently started noticing worsening swelling, redness and pain in his left leg from knee down.  Also has chronic swelling of his lower extremities due to CHF but this was much worse.   Patient was admitted for cellulitis of his left lower extremity. Cardiology also consulted for acute on chronic diastolic CHF, RV failure with cor pulmonale  Assessment and Plan: *  Cellulitis- left leg- (present on admission) - Left lower extremity with marked erythema, swelling, warm to touch, tender -Venous Dopplers negative for DVT.  X-rays negative for any fracture -still has significant erythema but swelling improving with IV Lasix -CT of the left lower extremity shows 3.3 x 7.9 cm hematoma and 5.2x 1.8x 6.2 cm also similar appearing possible hematoma.  Eliquis currently held -Will hold off on eliquis, per patient, he has PCP appointment on Monday. Rrepeat CT of the LLE, if hematoma is resolved, may resume Eliquis.   -Patient wants to go home asap as he has dental appointment tomorrow. Will dc on oral doxycycline 151m BID for 7 more days.  Hyperkalemia- (present on admission) - K3.4, replaced  Hematoma- (present on admission) -CT of the left lower extremity shows 3.3 x 7.9 cm hematoma and 5.2x 1.8x 6.2 cm also similar appearing possible hematoma. -Eliquis currently on hold.  -Will need repeat CT of the left lower leg to ensure resolution of both hematomas.  He has appointment with his cardiologist, Dr. CSallyanne Kusterand patient reports that he has appointment with his PCP on Monday, 09/23/2021.  Chronic respiratory failure with hypoxia (HCC)- (present on admission) - On 2 L O2 at home at night, will continue  Chronic heart failure with preserved ejection fraction (HFpEF) ([place]- (present on admission) - Swelling on the left lower extremity, reviewed cardiology office notes, patient has been on Bumex, also has noncompliance.  2D echo 03/2021 showed EF of 55%, indeterminate diastolic parameters -Negative balance of  3.1 L -Discussed with cardiology, recommended to hold diuresis today, may resume Bumex 4 mg  tomorrow -Patient has appointment with his cardiologist on 09/25/2021 and be met scheduled before that.  A-fib ([place])- (present on admission) - Currently rate controlled, continue metoprolol - CT of the left lower extremity shows 3.3 x 7.9 cm hematoma and 5.2x 1.8x 6.2  cm also similar appearing possible hematoma.  Eliquis currently held.   Essential hypertension- (present on admission) - BP stable, continue metoprolol  Fall- (present on admission) - PT evaluation  Obesity, Class III, BMI 40-49.9 (morbid obesity) ([place])- (present on admission) - Encouraged lifestyle changes, low-sodium diet  OSA (obstructive sleep apnea)- (present on admission) - On O2 at night, 2 L, continue.  Patient states he is not on CPAP.     Pain control - [employer] Controlled Substance Reporting System database was reviewed. and patient was instructed, not to drive, operate heavy machinery, perform activities at heights, swimming or participation in water activities or provide baby-sitting services while on Pain, Sleep and Anxiety Medications; until their outpatient Physician has advised to do so again. Also recommended to not to take more than prescribed Pain, Sleep and Anxiety Medications.   Consultants: cardiology Procedures performed:  Disposition: Home Diet recommendation:  Discharge Diet Orders (From admission, onward)     Start     Ordered   09/17/21 0000  Diet - low sodium heart healthy        09/17/21 1724           Cardiac diet  DISCHARGE MEDICATION: Allergies as of 09/17/2021       Reactions   Diltiazem    rash   Amiodarone Rash        Medication List     STOP taking these medications    torsemide 20 MG tablet Commonly known as: DEMADEX       TAKE these medications    apixaban 5 MG Tabs tablet Commonly known as: Eliquis Take 1 tablet (5 mg total) by mouth 2 (two) times daily. HOLD until follow up with your doctor What changed: additional instructions   bumetanide 2 MG tablet Commonly known as: BUMEX Take 2 tablets (4 mg total) by mouth daily. Start taking on: September 18, 2021   doxycycline 100 MG tablet Commonly known as: VIBRA-TABS Take 1 tablet (100 mg total) by mouth 2 (two) times daily for 7 days.   Magnesium Oxide  400 MG Caps Take 1 capsule (400 mg total) by mouth daily.   metoprolol succinate 50 MG 24 hr tablet Commonly known as: TOPROL-XL Take 1 tablet (50 mg total) by mouth daily. Take with or immediately following a meal.   nitroGLYCERIN 0.4 MG SL tablet Commonly known as: NITROSTAT Place 1 tablet (0.4 mg total) under the tongue every 5 (five) minutes x 3 doses as needed for chest pain.   Potassium Chloride ER 20 MEQ Tbcr TAKE ONE TABLET BY MOUTH DAILY *ON THE DAYS THAT YOU TAKE METOLAZONE TAKE AN EXTRA TABLET *40MEQ TOTAL* What changed: See the new instructions.        Follow-up Information     [name], [name], MD Follow up.   Specialty: Cardiology Why: [place] location - keep follow-up as scheduled on Wednesday Sep 25, 2021 at 10:30 AM (Arrive by 10:15 AM). Contact information: [address] [place] [address] [phone]         [facility] Follow up.   Specialty: Cardiology Why: Please come to the  [place] office on Thursday 09/19/21 to have repeat bloodwork (BMET). You do not need to be fasting for this. Contact information: [address] [place]        [name], MD. Schedule an appointment as soon as possible for a visit in 1 week(s).   Specialty: Internal Medicine Why: for hospital follow-up. Contact information: [address] [place] [address] [phone]         Sanda Klein, MD .   Specialty: Cardiology Contact information: [address] [place] [address] [phone]                 Discharge Exam: Danley Danker Weights   09/15/21 0500 09/16/21 0603 09/17/21 0500  Weight: (!) 141.9 kg (!) 141.6 kg (!) 139.6 kg   S: Eager to go home, has a dental appointment tomorrow  Physical Exam General: Alert and oriented x 3, NAD Cardiovascular: S1 S2 clear, RRR.  Respiratory: CTAB, no  wheezing Gastrointestinal: Soft, nontender, nondistended, NBS Ext: 1+ edema on the left, improving Neuro: no new deficits Skin: Redness and cellulitis of the left lower leg, improving Psych: Normal affect and demeanor, alert and oriented x3    Condition at discharge: fair  The results of significant diagnostics from this hospitalization (including imaging, microbiology, ancillary and laboratory) are listed below for reference.   Imaging Studies: DG Tibia/Fibula Left  Result Date: 09/13/2021 CLINICAL DATA:  Left lower leg pain and swelling after recent fall. EXAM: LEFT TIBIA AND FIBULA - 2 VIEW COMPARISON:  None. FINDINGS: No acute fracture or dislocation. Severe medial compartment joint space narrowing of the knee. The left ankle is grossly unremarkable. Severe diffuse soft tissue swelling. IMPRESSION: 1. Severe diffuse soft tissue swelling. No acute osseous abnormality. Electronically Signed   By: Titus Dubin M.D.   On: 09/13/2021 07:59   CT TIBIA FIBULA LEFT WO CONTRAST  Result Date: 09/13/2021 CLINICAL DATA:  Soft tissue infection suspected. Diffuse swelling and erythema. Likely cellulitis however on Eliquis. Evaluate for hematoma or active bleeding. EXAM: CT OF THE LOWER LEFT EXTREMITY WITHOUT CONTRAST TECHNIQUE: Multidetector CT imaging of the lower left extremity was performed according to the standard protocol. RADIATION DOSE REDUCTION: This exam was performed according to the departmental dose-optimization program which includes automated exposure control, adjustment of the mA and/or kV according to patient size and/or use of iterative reconstruction technique. COMPARISON:  Left tibia and fibula and left foot radiographs 09/13/2021 FINDINGS: Bones/Joint/Cartilage Mild peripheral patellar degenerative osteophytes with mild patellofemoral joint space narrowing. No cortical erosion is seen however within the proximal tibial metaphysis and diaphysis there are lobular regions of low-density  with peripheral sclerotic borders. This is at the level of the anteromedial and anterolateral proximal shin soft tissue hematoma. This is a nonspecific appearance on CT and may represent normal intraosseous fat, however differential considerations include an intraosseous lipoma or bone infarct. Given the intact cortices, osteomyelitis is felt less likely. This measures up to approximately 3.6 x 3.2 x 7.2 cm (transverse by AP by craniocaudal as measured on axial image 72 and coronal image 45). There is a smaller but similar region of density also seen within the distal tibial diaphysis, also with intact cortices. Ligaments Suboptimally assessed by CT. Muscles and Tendons Small fat density lipoma versus old injury of the medial head of the gastrocnemius muscle (axial series 4, image 102). Soft tissues Mild-to-moderate knee joint effusion. Moderate to high-grade edema and swelling of the subcutaneous fat of  the entire visualized left calf greatest at the medial and lateral aspects. There is also swelling and edema within the visualized knee and proximally visualized dorsal foot. Centered within the deep aspect of the anterolateral proximal shin (at the level of the proximal tibial metadiaphysis subcutaneous fat but also exerting mass effect on the subjacent tibialis anterior and extensor digitorum longus muscles of the anterior compartment of the calf, there is an oval well-circumscribed region with internal density of 64 Hounsfield units measuring up to approximately 6.8 by 3.3 by 7.9 cm (transverse by AP by craniocaudal as measured on axial series 4, image 83 and coronal series 9, image 31). This is suggestive of a hematoma. There is a smaller similar density well-circumscribed region within the anteromedial proximal calf at the same height measuring up to 5.2 x 1.8 by 6.2 cm (transverse by AP by craniocaudal, as measured on axial image 73 and coronal image 28). There also appears to be similar high density possible  hematoma connecting these 2 regions, anterior to the distal patellar tendon. IMPRESSION:: IMPRESSION: 1. Diffuse moderate to high-grade subcutaneous fat edema and swelling throughout the visualized knee, calf, and dorsal foot. 2. Dense collections within the proximal anteromedial and proximal anterolateral shin subcutaneous fat compatible with hematomas. 3. As described above, there are nonspecific regions of lobular low-density with peripheral sclerotic borders within the proximal tibial metadiaphysis and distal tibial diaphysis. This may represent normal intraosseous fat, however differential considerations include an intraosseous lipoma or bone infarct. Given the intact cortices, osteomyelitis is felt less likely. Electronically Signed   By: Yvonne Kendall M.D.   On: 09/13/2021 12:04   DG Foot Complete Left  Result Date: 09/13/2021 CLINICAL DATA:  Left foot pain and swelling after recent fall. EXAM: LEFT FOOT - COMPLETE 3+ VIEW COMPARISON:  None. FINDINGS: No acute fracture or dislocation. Old healed fracture of the distal second metatarsal. Joint spaces are preserved. Severe diffuse soft tissue swelling. IMPRESSION: 1. No acute osseous abnormality. 2. Severe diffuse soft tissue swelling. Electronically Signed   By: Titus Dubin M.D.   On: 09/13/2021 08:02   VAS [place] LOWER EXTREMITY VENOUS (DVT) (7a-7p)  Result Date: 09/13/2021  Lower Venous DVT Study Patient Name:  SHELLIE GOETTL  Date of Exam:   09/13/2021 Medical Rec #: [id number]      Accession #:    [id number] Date of Birth: 1946/01/21      Patient Gender: M Patient Age:   41 years Exam Location:  [facility] Procedure:      VAS [place] LOWER EXTREMITY VENOUS (DVT) Referring Phys: MELANIE BELFI --------------------------------------------------------------------------------  Indications: Swelling.  Risk Factors: Trauma. Limitations: Body habitus and poor ultrasound/tissue interface. Comparison Study: No prior studies. Performing Technologist: Oliver Hum RVT  Examination Guidelines: A complete evaluation includes B-mode imaging, spectral Doppler, color Doppler, and power Doppler as needed of all accessible portions of each vessel. Bilateral testing is considered an integral part of a complete examination. Limited examinations for reoccurring indications may be performed as noted. The reflux portion of the exam is performed with the patient in reverse Trendelenburg.  +-----+---------------+---------+-----------+----------+--------------+  RIGHT Compressibility Phasicity Spontaneity Properties Thrombus Aging  +-----+---------------+---------+-----------+----------+--------------+  CFV   Full            Yes       Yes                                    +-----+---------------+---------+-----------+----------+--------------+   +---------+---------------+---------+-----------+----------+-------------------+  LEFT      Compressibility Phasicity Spontaneity Properties Thrombus Aging       +---------+---------------+---------+-----------+----------+-------------------+  CFV       Full            Yes       Yes                                         +---------+---------------+---------+-----------+----------+-------------------+  SFJ       Full                                                                  +---------+---------------+---------+-----------+----------+-------------------+  FV Prox   Full                                                                  +---------+---------------+---------+-----------+----------+-------------------+  FV Mid                    Yes       Yes                                         +---------+---------------+---------+-----------+----------+-------------------+  FV Distal                 Yes       Yes                                         +---------+---------------+---------+-----------+----------+-------------------+  PFV       Full                                                                   +---------+---------------+---------+-----------+----------+-------------------+  POP       Full            Yes       Yes                                         +---------+---------------+---------+-----------+----------+-------------------+  PTV       Full                                                                  +---------+---------------+---------+-----------+----------+-------------------+  PERO  Not well visualized  +---------+---------------+---------+-----------+----------+-------------------+    Summary: RIGHT: - No evidence of common femoral vein obstruction.  LEFT: - There is no evidence of deep vein thrombosis in the lower extremity. However, portions of this examination were limited- see technologist comments above.  - No cystic structure found in the popliteal fossa.  *See table(s) above for measurements and observations. Electronically signed by Jamelle Haring on 09/13/2021 at 7:19:42 PM.    Final     Microbiology: Results for orders placed or performed during the hospital encounter of 09/13/21  Culture, blood (routine x 2)     Status: None (Preliminary result)   Collection Time: 09/13/21 10:22 AM   Specimen: BLOOD  Result Value Ref Range Status   Specimen Description   Final    BLOOD LEFT ANTECUBITAL Performed at [facility], [place] [address] [place], [place] [address]    Special Requests   Final    BOTTLES DRAWN AEROBIC AND ANAEROBIC Blood Culture adequate volume Performed at [facility] [address], [place], [place] [address]    Culture   Final    NO GROWTH 4 DAYS Performed at [facility], [place] [address], [place], [place] [address]    Report Status PENDING  Incomplete  Culture, blood (routine x 2)     Status: None (Preliminary result)   Collection Time: 09/13/21 10:40 AM   Specimen: BLOOD  Result Value Ref Range Status   Specimen Description   Final    BLOOD SITE NOT  SPECIFIED Performed at [facility] [address] [place], [place] [address]    Special Requests   Final    BOTTLES DRAWN AEROBIC AND ANAEROBIC Blood Culture adequate volume Performed at [facility] [address] [place], [place] [address]    Culture   Final    NO GROWTH 4 DAYS Performed at [facility], [place] [address], [place], [place] [address]    Report Status PENDING  Incomplete  Resp Panel by RT-PCR (Flu A&B, Covid) Nasopharyngeal Swab     Status: None   Collection Time: 09/13/21 10:47 AM   Specimen: Nasopharyngeal Swab; Nasopharyngeal(NP) swabs in vial transport medium  Result Value Ref Range Status   SARS Coronavirus 2 by RT PCR NEGATIVE NEGATIVE Final    Comment: (NOTE) SARS-CoV-2 target nucleic acids are NOT DETECTED.  The SARS-CoV-2 RNA is generally detectable in upper respiratory specimens during the acute phase of infection. The lowest concentration of SARS-CoV-2 viral copies this assay can detect is 138 copies/mL. A negative result does not preclude SARS-Cov-2 infection and should not be used as the sole basis for treatment or other patient management decisions. A negative result may occur with  improper specimen collection/handling, submission of specimen other than nasopharyngeal swab, presence of viral mutation(s) within the areas targeted by this assay, and inadequate number of viral copies(<138 copies/mL). A negative result must be combined with clinical observations, patient history, and epidemiological information. The expected result is Negative.  Fact Sheet for Patients:  EntrepreneurPulse.com.au  Fact Sheet for Healthcare Providers:  IncredibleEmployment.be  This test is no t yet approved or cleared by the [place] FDA and  has been authorized for detection and/or diagnosis of SARS-CoV-2 by FDA under an Emergency Use Authorization (EUA). This EUA will remain  in  effect (meaning this test can be used) for the duration of the COVID-19 declaration under Section 564(b)(1) of the Act, 21 U.S.C.section 360bbb-3(b)(1), unless the authorization is  terminated  or revoked sooner.       Influenza A by PCR NEGATIVE NEGATIVE Final   Influenza B by PCR NEGATIVE NEGATIVE Final    Comment: (NOTE) The Xpert Xpress SARS-CoV-2/FLU/RSV plus assay is intended as an aid in the diagnosis of influenza from Nasopharyngeal swab specimens and should not be used as a sole basis for treatment. Nasal washings and aspirates are unacceptable for Xpert Xpress SARS-CoV-2/FLU/RSV testing.  Fact Sheet for Patients: EntrepreneurPulse.com.au  Fact Sheet for Healthcare Providers: IncredibleEmployment.be  This test is not yet approved or cleared by the [place] FDA and has been authorized for detection and/or diagnosis of SARS-CoV-2 by FDA under an Emergency Use Authorization (EUA). This EUA will remain in effect (meaning this test can be used) for the duration of the COVID-19 declaration under Section 564(b)(1) of the Act, 21 U.S.C. section 360bbb-3(b)(1), unless the authorization is terminated or revoked.  Performed at [facility], [place] [address], [place], [place] [address]     Labs: CBC: Recent Labs  Lab 09/13/21 0725 09/14/21 0516 09/15/21 0507 09/17/21 0534  WBC 9.7 7.8 8.6 8.4  NEUTROABS 6.4  --   --   --   HGB 13.7 13.5 13.8 14.0  HCT 44.3 44.0 45.5 44.2  MCV 100.5* 101.1* 100.9* 99.5  PLT 200 202 219 859   Basic Metabolic Panel: Recent Labs  Lab 09/14/21 1815 09/15/21 0507 09/15/21 1643 09/16/21 0636 09/17/21 0534  NA 137 136 136 134* 132*  K 3.5 3.2* 4.9 5.2* 3.4*  CL 89* 88* 88* 88* 84*  CO2 37* 39* 40* 37* 39*  GLUCOSE 117* 121* 124* 139* 145*  BUN 21 20 22 21  26*  CREATININE 1.12 1.22 1.41* 1.53* 1.90*  CALCIUM 9.3 9.1 8.9 8.7* 9.1  MG 1.9 2.0 2.2 2.0 2.8*   Liver Function  Tests: No results for input(s): AST, ALT, ALKPHOS, BILITOT, PROT, ALBUMIN in the last 168 hours. CBG: No results for input(s): GLUCAP in the last 168 hours.  Discharge time spent: greater than 30 minutes.  Signed: Estill Cotta, MD [facility] 09/17/2021

## 2021-09-17 NOTE — Progress Notes (Signed)
Progress Note   Patient: Shane Diaz OZH:[id number] DOB: Aug 02, 1946 DOA: 09/13/2021     4 DOS: the patient was seen and examined on 09/17/2021   Brief hospital course: Is a 76 year old male with chronic diastolic CHFchronic diastolic CHF, EF 62%, OSA, hypertension, atrial fibrillation, on Eliquis, obesity, chronic respiratory failure on home O2 2 L presented to ED with marked swelling of his left lower leg.  Reported that he fell on Monday, 09/09/2020 when he was trying to get onto the electric wheelchair.  Subsequently started noticing worsening swelling, redness and pain in his left leg from knee down.  Also has chronic swelling of his lower extremities due to CHF but this was much worse.   Patient was admitted for cellulitis of his left lower extremity. Cardiology also consulted for acute on chronic diastolic CHF, RV failure with cor pulmonale  Assessment and Plan: * Cellulitis- left leg- (present on admission) - Left lower extremity with marked erythema, swelling, warm to touch, tender -Venous Dopplers negative for DVT.  X-rays negative for any fracture -still has significant erythema but swelling improving with IV Lasix -CT of the left lower extremity shows 3.3 x 7.9 cm hematoma and 5.2x 1.8x 6.2 cm also similar appearing possible hematoma.  Eliquis currently held --Narrow antibiotics to doxycycline and ceftriaxone. -Will hold off on eliquis, per patient, he has PCP appointment on Monday, repeat CT of the LLE, if hematoma is resolved, may resume Eliquis.  Patient has been refusing labs today, difficult to initiate heparin.  Hyperkalemia- (present on admission) - K3.4, replaced  Hematoma- (present on admission) -CT of the left lower extremity shows 3.3 x 7.9 cm hematoma and 5.2x 1.8x 6.2 cm also similar appearing possible hematoma. -Eliquis currently on hold  Chronic respiratory failure with hypoxia (HCC)- (present on admission) - On 2 L O2 at home at night, will continue  Chronic  heart failure with preserved ejection fraction (HFpEF) ([place])- (present on admission) - Swelling on the left lower extremity, reviewed cardiology office notes, patient has been on Bumex, also has noncompliance.  2D echo 03/2021 showed EF of 55%, indeterminate diastolic parameters -Negative balance of 3.1 L -On IV Lasix, management per cardiology  A-fib [facility])- (present on admission) - Currently rate controlled, continue metoprolol - CT of the left lower extremity shows 3.3 x 7.9 cm hematoma and 5.2x 1.8x 6.2 cm also similar appearing possible hematoma.  Eliquis currently held. -Patient refusing labs, hence cannot do IV heparin.  He however states that he has a PCP appointment on Monday.  If CT can be repeated and hematoma is resolved, may resume Eliquis then.  Can do subcutaneous Lovenox if cardiology prefers.  Essential hypertension- (present on admission) - BP stable, continue metoprolol  Fall- (present on admission) - PT evaluation  Obesity, Class III, BMI 40-49.9 (morbid obesity) ([place])- (present on admission) - Encouraged lifestyle changes, low-sodium diet  OSA (obstructive sleep apnea)- (present on admission) - On O2 at night, 2 L, continue.  Patient states he is not on CPAP.    Subjective: Eager to go home, no chest pain or acute shortness of breath.  He states he has a dental appointment on Wednesday that he would like to keep.  Also reports that he has a PCP appointment on Monday.  Lower extremity swelling is improving but still significant peripheral edema and redness.  Physical Exam: Vitals:   09/16/21 2047 09/17/21 0500 09/17/21 0524 09/17/21 0916  BP: 123/70  129/68 132/89  Pulse: 84  83   Resp: 20  20   Temp: 99.8 F (37.7 C)  99 F (37.2 C)   TempSrc: Oral  Oral   SpO2: 93%  95%   Weight:  (!) 139.6 kg    Height:       Physical Exam General: Alert and oriented x 3, NAD Cardiovascular: S1 S2 clear, RRR.  Respiratory: Diminished breath sound at the  bases Gastrointestinal: Soft, nontender, nondistended, NBS Ext: pitting edema on the left, improving with marked redness, hematoma towards lateral knee Neuro: no new deficits Skin: Marked erythema and cellulitis on the left lower extremity improving Psych: Normal affect and demeanor, alert and oriented x3    Data Reviewed: I have reviewed CBC and be met.  Sodium 132, potassium 3.4, CO2 trending up 39, creatinine 1.9, up from 1.5 Hemoglobin stable 14.4  Family Communication: Discussed with patient's wife at the bedside on 2/13  Disposition: Status is: Inpatient Remains inpatient appropriate because: On IV Lasix for diuresis   Planned Discharge Destination: Home   Time spent: 35 minutes  Author: Estill Cotta, MD 09/17/2021 4:03 PM  For on call review [email or web address].

## 2021-09-17 NOTE — Plan of Care (Signed)
Pt wishes for doctor to speak with him about blood draws and why they are necessary. Problem: Clinical Measurements: Goal: Ability to avoid or minimize complications of infection will improve Outcome: Progressing   Problem: Skin Integrity: Goal: Skin integrity will improve Outcome: Progressing   Problem: Education: Goal: Knowledge of General Education information will improve Description: Including pain rating scale, medication(s)/side effects and non-pharmacologic comfort measures Outcome: Progressing   Problem: Health Behavior/Discharge Planning: Goal: Ability to manage health-related needs will improve Outcome: Progressing   Problem: Clinical Measurements: Goal: Ability to maintain clinical measurements within normal limits will improve Outcome: Progressing Goal: Will remain free from infection Outcome: Progressing Goal: Diagnostic test results will improve Outcome: Progressing Goal: Respiratory complications will improve Outcome: Progressing Goal: Cardiovascular complication will be avoided Outcome: Progressing   Problem: Activity: Goal: Risk for activity intolerance will decrease Outcome: Progressing   Problem: Nutrition: Goal: Adequate nutrition will be maintained Outcome: Progressing   Problem: Coping: Goal: Level of anxiety will decrease Outcome: Progressing   Problem: Elimination: Goal: Will not experience complications related to bowel motility Outcome: Progressing Goal: Will not experience complications related to urinary retention Outcome: Progressing   Problem: Pain Managment: Goal: General experience of comfort will improve Outcome: Progressing   Problem: Safety: Goal: Ability to remain free from injury will improve Outcome: Progressing   Problem: Skin Integrity: Goal: Risk for impaired skin integrity will decrease Outcome: Progressing

## 2021-09-17 NOTE — Evaluation (Signed)
Physical Therapy Evaluation Patient Details Name: Shane Diaz MRN: [id number] DOB: 07-18-1946 Today's Date: 09/17/2021  History of Present Illness  Patient is 76 y.o. male presented to ED after for Lt LE edema and pain. Pt reports fall on 2/6 while trying to get on scoot at [place]. PMH significant for chronic diastolic CHFchronic diastolic CHF, EF 66%, OSA, hypertension, atrial fibrillation, on Eliquis, obesity, chronic respiratory failure on home supplemental O2 (2 L/min).    Clinical Impression  Shane Diaz is 76 y.o. male admitted with above HPI and diagnosis. Patient is currently limited by functional impairments below (see PT problem list). Patient lives with his wife and is independent at baseline. He reports use of O2 at night but typically does not use while mobilizing. Patient desat to 84% on RA with mobility and required 2L/min to recover to 96%. Patient will benefit from continued skilled PT interventions to address impairments and progress independence with mobility, anticipate pt will progress mobility and be able to discharge home without follow up PT services. Acute PT will follow and progress as able.        Recommendations for follow up therapy are one component of a multi-disciplinary discharge planning process, led by the attending physician.  Recommendations may be updated based on patient status, additional functional criteria and insurance authorization.  Follow Up Recommendations No PT follow up    Assistance Recommended at Discharge Intermittent Supervision/Assistance  Patient can return home with the following       Equipment Recommendations None recommended by PT  Recommendations for Other Services       Functional Status Assessment Patient has had a recent decline in their functional status and demonstrates the ability to make significant improvements in function in a reasonable and predictable amount of time.     Precautions / Restrictions  Precautions Precautions: Fall Restrictions Weight Bearing Restrictions: No      Mobility  Bed Mobility Overal bed mobility: Modified Independent                  Transfers Overall transfer level: Needs assistance Equipment used: None Transfers: Sit to/from Stand Sit to Stand: Min guard, Supervision           General transfer comment: guard/supervision for safety with rise from EOB. No assist needed, pt using momentum to power up.    Ambulation/Gait Ambulation/Gait assistance: Supervision, Min guard Gait Distance (Feet): 80 Feet Assistive device: None Gait Pattern/deviations: Step-through pattern, Decreased stride length, Wide base of support Gait velocity: fair     General Gait Details: Wide BOS for habitus, pt with slight sway/stagger due to habitus. no LOB noted. pt desat to 84% on RA with gait and did not recover with rest until 2L/min donned. recovered to 96%.  Stairs            Wheelchair Mobility    Modified Rankin (Stroke Patients Only)       Balance Overall balance assessment: Mild deficits observed, not formally tested                                           Pertinent Vitals/Pain Pain Assessment Pain Assessment: No/denies pain    Home Living Family/patient expects to be discharged to:: Private residence Living Arrangements: Spouse/significant other Available Help at Discharge: Family Type of Home: House Home Access: Stairs to enter Entrance Stairs-Rails: None Entrance Stairs-Number of Steps: 2  Home Layout: One level Home Equipment: Conservation officer, nature (2 wheels);Cane - single point;Shower seat;Electric scooter      Prior Function Prior Level of Function : Independent/Modified Independent                     Hand Dominance   Dominant Hand: Right    Extremity/Trunk Assessment   Upper Extremity Assessment Upper Extremity Assessment: Overall WFL for tasks assessed    Lower Extremity  Assessment Lower Extremity Assessment: Overall WFL for tasks assessed;RLE deficits/detail;LLE deficits/detail (bil LE edema with significant hemosiderin staining and hematoma on Lt LE)    Cervical / Trunk Assessment Cervical / Trunk Assessment: Other exceptions Cervical / Trunk Exceptions: habitus  Communication   Communication: No difficulties  Cognition Arousal/Alertness: Awake/alert Behavior During Therapy: WFL for tasks assessed/performed Overall Cognitive Status: Within Functional Limits for tasks assessed                                          General Comments      Exercises     Assessment/Plan    PT Assessment Patient needs continued PT services  PT Problem List Decreased activity tolerance;Decreased mobility;Cardiopulmonary status limiting activity       PT Treatment Interventions DME instruction;Gait training;Stair training;Functional mobility training;Therapeutic activities;Therapeutic exercise;Balance training;Patient/family education    PT Goals (Current goals can be found in the Care Plan section)  Acute Rehab PT Goals Patient Stated Goal: go home PT Goal Formulation: With patient Time For Goal Achievement: 09/24/21 Potential to Achieve Goals: Good    Frequency Min 3X/week     Co-evaluation               AM-PAC PT "6 Clicks" Mobility  Outcome Measure Help needed turning from your back to your side while in a flat bed without using bedrails?: None Help needed moving from lying on your back to sitting on the side of a flat bed without using bedrails?: None Help needed moving to and from a bed to a chair (including a wheelchair)?: A Little Help needed standing up from a chair using your arms (e.g., wheelchair or bedside chair)?: A Little Help needed to walk in hospital room?: A Little Help needed climbing 3-5 steps with a railing? : A Lot 6 Click Score: 19    End of Session Equipment Utilized During Treatment: Gait  belt Activity Tolerance: Patient tolerated treatment well Patient left: in bed;with call bell/phone within reach Nurse Communication: Mobility status PT Visit Diagnosis: Difficulty in walking, not elsewhere classified (R26.2);Other abnormalities of gait and mobility (R26.89)    Time: 7793-9030 PT Time Calculation (min) (ACUTE ONLY): 20 min   Charges:   PT Evaluation $PT Eval Low Complexity: 1 Low          Verner Mould, DPT Acute Rehabilitation Services Office [phone] Pager [phone]   Jacques Navy 09/17/2021, 3:19 PM

## 2021-09-17 NOTE — Progress Notes (Signed)
Dr. Tana Coast reviewed case with Dr. Percival Spanish and felt pt could be discharged today. They reviewed medication recommendations. Will cx echo order. Per d/w MD, recommend BMET later this week (ordered for Thursday) and follow-up as scheduled 2/22 with Dr. Sallyanne Kuster. Follow-up info outlined on AVS.

## 2021-09-17 NOTE — Plan of Care (Signed)
  Problem: Clinical Measurements: Goal: Ability to maintain clinical measurements within normal limits will improve Outcome: Progressing   Problem: Activity: Goal: Risk for activity intolerance will decrease Outcome: Progressing   Problem: Nutrition: Goal: Adequate nutrition will be maintained Outcome: Progressing   Problem: Coping: Goal: Level of anxiety will decrease Outcome: Progressing   

## 2021-09-17 NOTE — TOC CM/SW Note (Signed)
°  Transition of Care [facility]) Screening Note   Patient Details  Name: Oaklyn Mans Date of Birth: Apr 01, 1946   Transition of Care [facility]) CM/SW Contact:    Ross Ludwig, LCSW Phone Number: 09/17/2021, 6:44 PM    Transition of Care Department [facility]) has reviewed patient and no TOC needs have been identified at this time. We will continue to monitor patient advancement through interdisciplinary progression rounds. If new patient transition needs arise, please place a TOC consult.

## 2021-09-17 NOTE — Care Management Important Message (Signed)
Important Message  Patient Details IM Letter placed in Patients room. Name: Shane Diaz MRN: [id number] Date of Birth: Oct 13, 1945   Medicare Important Message Given:  Yes     Kerin Salen 09/17/2021, 12:13 PM

## 2021-09-17 NOTE — Progress Notes (Signed)
BMET Thurs per Dr. Percival Spanish

## 2021-09-17 NOTE — Progress Notes (Addendum)
Progress Note  Patient Name: Shane Diaz Date of Encounter: 09/17/2021  Primary Cardiologist: Sanda Klein, MD  Subjective   Pt reports significant improvement in LLE swelling although still present. States he feels good. No SOB. No CP.  Inpatient Medications    Scheduled Meds:  doxycycline  100 mg Oral Q12H   magnesium oxide  400 mg Oral Daily   metoprolol succinate  50 mg Oral Daily   Continuous Infusions:  cefTRIAXone (ROCEPHIN)  IV 2 g (09/16/21 2156)   furosemide (LASIX) 200 mg in dextrose 5% 100 mL (2mg /mL) infusion 10 mg/hr (09/17/21 0312)   PRN Meds: acetaminophen **OR** acetaminophen, hydrALAZINE, HYDROcodone-acetaminophen, morphine injection, nitroGLYCERIN, ondansetron **OR** ondansetron (ZOFRAN) IV, polyethylene glycol   Vital Signs    Vitals:   09/16/21 2047 09/17/21 0500 09/17/21 0524 09/17/21 0916  BP: 123/70  129/68 132/89  Pulse: 84  83   Resp: 20  20   Temp: 99.8 F (37.7 C)  99 F (37.2 C)   TempSrc: Oral  Oral   SpO2: 93%  95%   Weight:  (!) 139.6 kg    Height:        Intake/Output Summary (Last 24 hours) at 09/17/2021 1324 Last data filed at 09/17/2021 0523 Gross per 24 hour  Intake 416.83 ml  Output 1300 ml  Net -883.17 ml   Last 3 Weights 09/17/2021 09/16/2021 09/15/2021  Weight (lbs) 307 lb 12.2 oz 312 lb 2.7 oz 312 lb 13.3 oz  Weight (kg) 139.6 kg 141.6 kg 141.9 kg     Telemetry    NSR - Personally Reviewed  Physical Exam   GEN: No acute distress, morbidly obese HEENT: Normocephalic, atraumatic, sclera non-icteric. Neck: No JVD or bruits though thick neck makes this challenging Cardiac: RRR no murmurs, rubs, or gallops.  Respiratory: Clear to auscultation bilaterally. Breathing is unlabored. GI: Soft, nontender, non-distended, BS +x 4. MS: no deformity. Extremities: No clubbing or cyanosis. Marked venous stasis thickening bilaterally with 1+ stiff RLE edema and marked LLE edema with LL erythema Neuro:  AAOx3. Follows  commands. Psych:  Responds to questions appropriately with a normal affect.  Labs    High Sensitivity Troponin:  No results for input(s): TROPONINIHS in the last 720 hours.    Cardiac EnzymesNo results for input(s): TROPONINI in the last 168 hours. No results for input(s): TROPIPOC in the last 168 hours.   Chemistry Recent Labs  Lab 09/15/21 1643 09/16/21 0636 09/17/21 0534  NA 136 134* 132*  K 4.9 5.2* 3.4*  CL 88* 88* 84*  CO2 40* 37* 39*  GLUCOSE 124* 139* 145*  BUN 22 21 26*  CREATININE 1.41* 1.53* 1.90*  CALCIUM 8.9 8.7* 9.1  GFRNONAA 52* 47* 36*  ANIONGAP 8 9 9      Hematology Recent Labs  Lab 09/14/21 0516 09/15/21 0507 09/17/21 0534  WBC 7.8 8.6 8.4  RBC 4.35 4.51 4.44  HGB 13.5 13.8 14.0  HCT 44.0 45.5 44.2  MCV 101.1* 100.9* 99.5  MCH 31.0 30.6 31.5  MCHC 30.7 30.3 31.7  RDW 13.3 13.2 13.0  PLT 202 219 209    BNPNo results for input(s): BNP, PROBNP in the last 168 hours.   DDimer No results for input(s): DDIMER in the last 168 hours.   Radiology    No results found.  Cardiac Studies   2D Echo 03/19/21  1. Left ventricular ejection fraction, by estimation, is 55%. The left  ventricle has normal function. The left ventricle has no regional wall  motion  abnormalities. There is severe left ventricular hypertrophy. Left  ventricular diastolic parameters are  indeterminate.   2. Right ventricular systolic function is normal. The right ventricular  size is normal.   3. Left atrial size was moderately dilated.   4. The mitral valve is abnormal. Trivial mitral valve regurgitation. No  evidence of mitral stenosis.   5. The aortic valve is tricuspid. Aortic valve regurgitation is not  visualized. Mild to moderate aortic valve sclerosis/calcification is  present, without any evidence of aortic stenosis.   6. The inferior vena cava is normal in size with greater than 50%  respiratory variability, suggesting right atrial pressure of 3 mmHg.   Patient  Profile     76 y.o. 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 Prior CTA 03/2021 with dilated PA, 2v cor atherosclerosis, and Poorly marginated soft tissue lesions in the posterior chest wall bilaterally, right greater than left, not appreciably changed since 07/14/2020 CT suggestive of elastofibroma dorsi.  Last seen by Dr. Sallyanne Kuster 06/2021 with substantial volume overload. Diltiazem stopped due to rash (amiodarone previously stopped but this did not help, diltiazem and torsemide then changed to metoprolol and bumetanide). Attempts were made to diurese as outpatient; pt wished to avoid hospitalization. Plan was for IV diuretics through heart failure clinic but do not see this occurred. Dr. Sallyanne Kuster felt that edema would be very difficult to treat without careful sodium restriction and treatment of OSA. Pt presented to the hospital 09/13/20 with fall, pain and swelling in his leg - diagnosed with cellulitis of the LLE, hematoma of LLE, and found to be massively volume overloaded.   Assessment & Plan    1. Acute on chronic diastolic CHF - now developing hyponatremia and AKI with diuresis with BUN/Cr rising to 26/1.90 from prior normal value - will stop Lasix infusion given AKI and will d/w MD  - will repeat echocardiogram given potential development of cardiorenal syndrome and prior normal LV/RV (although highly suspected of having cor pulmonale in the past), although the rise in Cr may reflect that we simply cannot fully diurese off his chronic edema - add hepatic function panel in AM to assess albumin - weight 317->307, -2.7L this adm so far - previous dry weight estimated at 260lb but this is by  notes from last year and body weight gain could be factoring in as well - question whether pt would benefit from [place] to formally evaluate - K 3.4, but was elevated yesterday requiring Lokelma - f/u in AM  2. Paroxysmal atrial fibrillation - Eliquis held due to LLE hematoma, await clearance from IM to resume - continue metoprolol  3. LLE cellulitis and LLE hematoma s/p recent fall - repeat duplex negative for DVT - tx with abx - anticoag as above  4. H/o recurrent DVT - anticoag as above  5. Morbid obesity and OSA -Will need continued dietary adjustments and not very mobile at baseline -Consider GLP-1 agonist as outpatient -Has not gone for sleep study despite multiple attempts; will continue to encourgae -Continue supplemental O2 at night  6. HTN - managed in context above   For questions or updates, please contact [place] Please consult [email or web address] for contact info under Cardiology/STEMI.  Signed, Charlie Pitter,  PA-C 09/17/2021, 1:24 PM    History and all data above reviewed. .  I agree with the findings as above.     All available labs, radiology testing, previous records reviewed. Agree with documented assessment and plan. He is going to be discharged today.  He has no respiratory complaints and he is anxious to go home.  We are not providing any IV therapy as the Lasix is held with a bump in his creatinine.  He can be discharged but has close follow up with Sanda Klein, MD.  He is scheduled to have a BMET on Thursday. I have suggested holding the Bumex for one day after discharge and then resume at previous dose.   Shane Diaz  5:34 PM  09/17/2021

## 2021-09-18 LAB — CULTURE, BLOOD (ROUTINE X 2)
Culture: NO GROWTH
Culture: NO GROWTH
Special Requests: ADEQUATE
Special Requests: ADEQUATE

## 2021-09-19 ENCOUNTER — Other Ambulatory Visit: Payer: Self-pay

## 2021-09-19 DIAGNOSIS — I5033 Acute on chronic diastolic (congestive) heart failure: Secondary | ICD-10-CM

## 2021-09-19 DIAGNOSIS — N179 Acute kidney failure, unspecified: Secondary | ICD-10-CM

## 2021-09-19 LAB — BASIC METABOLIC PANEL
BUN/Creatinine Ratio: 13 (ref 10–24)
BUN: 27 mg/dL (ref 8–27)
CO2: 36 mmol/L — ABNORMAL HIGH (ref 20–29)
Calcium: 10.4 mg/dL — ABNORMAL HIGH (ref 8.6–10.2)
Chloride: 84 mmol/L — ABNORMAL LOW (ref 96–106)
Creatinine, Ser: 2.03 mg/dL — ABNORMAL HIGH (ref 0.76–1.27)
Glucose: 116 mg/dL — ABNORMAL HIGH (ref 70–99)
Potassium: 4.4 mmol/L (ref 3.5–5.2)
Sodium: 136 mmol/L (ref 134–144)
eGFR: 34 mL/min/{1.73_m2} — ABNORMAL LOW (ref >59)

## 2021-09-25 ENCOUNTER — Encounter: Payer: Self-pay | Admitting: Cardiovascular Disease

## 2021-09-25 ENCOUNTER — Ambulatory Visit (INDEPENDENT_AMBULATORY_CARE_PROVIDER_SITE_OTHER): Payer: Medicare Other | Admitting: Cardiovascular Disease

## 2021-09-25 ENCOUNTER — Ambulatory Visit (HOSPITAL_COMMUNITY)
Admission: RE | Admit: 2021-09-25 | Discharge: 2021-09-25 | Disposition: A | Discharge status: Home / Self Care | Payer: Medicare Other | Source: Ambulatory Visit | Attending: Cardiovascular Disease | Admitting: Cardiovascular Disease

## 2021-09-25 ENCOUNTER — Other Ambulatory Visit: Payer: Self-pay

## 2021-09-25 VITALS — BP 122/76 | HR 87 | Ht 67.0 in | Wt 312.0 lb

## 2021-09-25 DIAGNOSIS — I82541 Chronic embolism and thrombosis of right tibial vein: Secondary | ICD-10-CM | POA: Insufficient documentation

## 2021-09-25 DIAGNOSIS — M7989 Other specified soft tissue disorders: Secondary | ICD-10-CM | POA: Diagnosis not present

## 2021-09-25 DIAGNOSIS — I4819 Other persistent atrial fibrillation: Secondary | ICD-10-CM | POA: Diagnosis not present

## 2021-09-25 DIAGNOSIS — I5033 Acute on chronic diastolic (congestive) heart failure: Secondary | ICD-10-CM

## 2021-09-25 DIAGNOSIS — L298 Other pruritus: Secondary | ICD-10-CM

## 2021-09-25 DIAGNOSIS — G4733 Obstructive sleep apnea (adult) (pediatric): Secondary | ICD-10-CM

## 2021-09-25 DIAGNOSIS — R7303 Prediabetes: Secondary | ICD-10-CM

## 2021-09-25 NOTE — Patient Instructions (Addendum)
Medication Instructions:  No changes *If you need a refill on your cardiac medications before your next appointment, please call your pharmacy*   Lab Work: Your provider would like for you to have the following labs today: BMET  If you have labs (blood work) drawn today and your tests are completely normal, you will receive your results only by: [place] (if you have MyChart) OR A paper copy in the mail If you have any lab test that is abnormal or we need to change your treatment, we will call you to review the results.   Testing/Procedures: Your physician has requested that you have a lower extremity venous duplex. This test is an ultrasound of the veins in the legs or arms. It looks at venous blood flow that carries blood from the heart to the legs or arms. Allow one hour for a Lower Venous exam.  There are no restrictions or special instructions.  Follow-Up: At [facility], you and your health needs are our priority.  As part of our continuing mission to provide you with exceptional heart care, we have created designated Provider Care Teams.  These Care Teams include your primary Cardiologist (physician) and Advanced Practice Providers (APPs -  Physician Assistants and Nurse Practitioners) who all work together to provide you with the care you need, when you need it.  We recommend signing up for the patient portal called "MyChart".  Sign up information is provided on this After Visit Summary.  MyChart is used to connect with patients for Virtual Visits (Telemedicine).  Patients are able to view lab/test results, encounter notes, upcoming appointments, etc.  Non-urgent messages can be sent to your provider as well.   To learn more about what you can do with MyChart, go to [email or web address].    Your next appointment:   Follow up in one month with Dr. Sallyanne Kuster

## 2021-09-25 NOTE — Progress Notes (Signed)
Cardiology new patient note:    Date:  09/25/2021   ID:  Odes Lolli, DOB 1946/02/06, MRN [id number]  PCP:  [name], MD  Cardiologist:  Sanda Klein, MD New Electrophysiologist:  None   Referring MD: [name], MD   Chief Complaint  Patient presents with   Leg Swelling      History of Present Illness:    Deondray Ospina is a 76 y.o. 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  He was recently hospitalized after a fall complicated by a moderate size hematoma of his left anterior pretibial area.  There is no evidence of active bleeding since then and the hematoma is softer and less prominent since then.  He continues to have severe swelling in his left calf with erythematous, mildly cyanotic skin.  He has completed treatment with antibiotics for possible cellulitis.  He does not have fever or chills.  Denies cough or hemoptysis.  He has been off Eliquis now for several days.  He has not had any falls and denies any bleeding problems since hospital discharge.  It's been a long struggle to get rid of severe, persistent edema of the lower extremities, particularly bad on the left side for several months now.  He had an estimated dry weight of 260 pounds when he was hospitalized more than a year ago, has had limited success with outpatient oral diuretics.   During the recent hospitalization he was treated with intravenous diuretics, but this had to be discontinued when he developed acute kidney injury.  Creatinine was 1.9 at the time of discharge on 09/17/2021 and was still elevated at 2.0 on 09/19/2021.  He has continued to take oral diuretics but seems to be gaining fluid back.  Although his body wide rash appears to have subsided, he still has some pruritic erythematous plaques on his neck, no longer seen on his back and abdomen and limbs.  He is very sedentary.  He denies orthopnea and PND but has exertional dyspnea.  His wife continues to be mowing his inability to comply with sodium restriction. He denies orthopnea or PND but has exertional dyspnea.    He is a loud snorer and has severe daytime hypersomnolence.  We have scheduled him for sleep studies at least on 2 occasions and he has not ever completed a sleep test.  Despite his multiple medical problems, the acute hospitalization and the discontinuation of amiodarone, he has remained in normal sinus rhythm.  On a positive note, he is maintaining normal sinus rhythm.  In the past, he is occasionally detected episodes of tachycardia with his pulse oximeter when his heart rate would reach around 150 bpm, likely atrial flutter.  He has never been aware of palpitations.  He also continues to be reluctant to commit to a sleep study, although I have told him repeatedly that I think the underlying cause for his right heart failure (and the worst factor in his prognosis) remains untreated sleep apnea.  His  most recent labs show a borderline hemoglobin A1c of 5.8% with a fairly satisfactory cholesterol profile (very low HDL but also low total and LDL cholesterol).  He had Covid pneumonia requiring hospitalization in October 2021.  He was hospitalized with hypoxemia due to decompensated congestive heart failure and atrial flutter with rapid ventricular response in early December 2021.   On transthoracic  echocardiogram LV EF was 50-55%, but the right ventricle was mildly dilated and mildly depressed.  TEE showed LVEF 45-50% and a severely enlarged and depressed right ventricle with moderate tricuspid regurgitation. He underwent TEE guided cardioversion successfully on 07/13/2020 and discharged on beta-blockers and anticoagulants, but returned and was hospitalized again with similar problems on 08/01/2020.  An attempt at cardioversion on 08/02/2020 was not successful. IV Amiodarone was initiated.  Cardioversion was tried again the next day, again unsuccessfully.  The plan was to continue loading with amiodarone and bring back for cardioversion.  He was seen back in clinic in mid January and declined repeat cardioversion.  He was again hospitalized in February for volume overload complicated by cellulitis of the lower extremities, sepsis, atrial fibrillation rapid ventricular response.  He required fluid resuscitation with worsening edema.  The arrhythmia was managed conservatively and his volume status improved after intravenous diuretics.  At hospital discharge his dry weight was estimated to be 270 pounds.  At that weight, his wife reports that his legs were thin and that he no longer had edema or blisters or skin wounds.  Once the patient returned home, he returned to his old habits of adding salt to his food, eating a lot of processed meats and pickles.  He spends the day sitting in a chair at the computer and has developed gradually worsening lower extremity edema.  His weight has gone up 16 pounds and he has developed a new weeping wound on his right leg and blisters on the back of his left calf.  He has been unable to wear compression stockings and does not like to keep his legs elevated.  He also developed a "lobster red" pruritic rash on his neck and shoulders and lower limbs.  He stopped taking amiodarone 4 days before this appointment and the rash is beginning to improve.  It is no longer as pruritic.   He has been taking Zyrtec.  He is on a relatively short list of medications, the only relatively new one is indeed amiodarone   In 2016, years ago he had a right distal femoral and popliteal DVT, partially resolved by follow-up ultrasound in late 2016 but with chronic occlusion of the right tibioperoneal venous trunk.  About 3 years ago he was having a lot of lower extremity pain related to large varicose veins and underwent venous ablation in his right lower extremity.  He had laser ablation of the left greater saphenous vein and stab phlebectomies of the left leg in November 2016  Starting in March 2020 he had gradually worsening pain and swelling in the left calf, more so than on the right.  In August of this year he developed severe swelling and discomfort in his left calf and was diagnosed with a DVT and started on Xarelto.  Switched to Eliquis later because of suspected allergy (I suspect this was actually only normal skin changes related to brawny edema).   He has a follow-up visit and ultrasound scheduled with Dr. Scot Dock on March 11.  He denies angina or dyspnea at rest or with his usual activity (he is quite sedentary).  He has not had focal neurological events, syncope, palpitations or serious bleeding problems.  He canceled his sleep study.  It sounds like he would not use CPAP even if we did confirm the diagnosis.  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  He has been living in [place] for the last 10 years or so.  Past Medical History:  Diagnosis Date   Acute diastolic heart failure ([place]) 08/03/2020   Arthritis    Diverticulosis 05/2015   Mild, noted on colonosocpy   Fatty liver 05/14/2015   noted on [place] ABD   History of colon polyps 05/2014   sessile in ascending colon, pedunculated in sigmoid colon, diverticulosis   Hyperlipidemia     pt denies   Hypertension    not currently taking medications per MD   OSA (obstructive sleep apnea) 08/03/2020   Renal cyst 05/2014   Small, Left, noted on [place] ABD   Umbilical hernia    Varicose veins     Past Surgical History:  Procedure Laterality Date   APPENDECTOMY     childhood   BIOPSY  07/11/2020   Procedure: BIOPSY;  Surgeon: Ladene Artist, MD;  Location: [facility];  Service: Endoscopy;;   CARDIOVERSION N/A 07/13/2020   Procedure: CARDIOVERSION;  Surgeon: Josue Hector, MD;  Location: [facility] ENDOSCOPY;  Service: Cardiovascular;  Laterality: N/A;   CARDIOVERSION N/A 08/02/2020   Procedure: CARDIOVERSION;  Surgeon: Werner Lean, MD;  Location: [facility] ENDOSCOPY;  Service: Cardiovascular;  Laterality: N/A;   COLONOSCOPY W/ POLYPECTOMY  05/23/2014   COLONOSCOPY WITH PROPOFOL N/A 07/11/2020   Procedure: COLONOSCOPY WITH PROPOFOL;  Surgeon: Ladene Artist, MD;  Location: [facility] ENDOSCOPY;  Service: Endoscopy;  Laterality: N/A;   ESOPHAGOGASTRODUODENOSCOPY (EGD) WITH PROPOFOL N/A 07/11/2020   Procedure: ESOPHAGOGASTRODUODENOSCOPY (EGD) WITH PROPOFOL;  Surgeon: Ladene Artist, MD;  Location: [facility] ENDOSCOPY;  Service: Endoscopy;  Laterality: N/A;   TEE WITHOUT CARDIOVERSION N/A 07/13/2020   Procedure: TRANSESOPHAGEAL ECHOCARDIOGRAM (TEE);  Surgeon: Josue Hector, MD;  Location: [facility] ENDOSCOPY;  Service: Cardiovascular;  Laterality: N/A;   UMBILICAL HERNIA REPAIR N/A 04/27/2018   Procedure: REPAIR UMBILICAL HERNIA ERAS PATHWAY;  Surgeon: Alphonsa Overall, MD;  Location: [facility] ORS;  Service: General;  Laterality: N/A;   varicose veins     WRIST SURGERY      Current Medications: Current Meds  Medication Sig   bumetanide (BUMEX) 2 MG tablet Take 2 tablets (4 mg total) by mouth daily.   Magnesium Oxide 400 MG CAPS Take 1 capsule (400 mg total) by mouth daily.   metoprolol succinate (TOPROL-XL) 50 MG 24 hr tablet Take 1 tablet (50 mg total) by mouth daily. Take with or immediately  following a meal.   Potassium Chloride ER 20 MEQ TBCR TAKE ONE TABLET BY MOUTH DAILY *ON THE DAYS THAT YOU TAKE METOLAZONE TAKE AN EXTRA TABLET *40MEQ TOTAL* (Patient taking differently: Take 1 tablet by mouth daily.)     Allergies:   Diltiazem and Amiodarone   Social History   Socioeconomic History   Marital status: Married    Spouse name: Not on file   Number of children: 0   Years of education: Not on file   Highest education level: Not on file  Occupational History   Not on file  Tobacco Use   Smoking status: Never   Smokeless tobacco: Never  Vaping  Use   Vaping Use: Never used  Substance and Sexual Activity   Alcohol use: Yes    Alcohol/week: 0.0 standard drinks    Comment: occassional wine, martini   Drug use: No   Sexual activity: Not on file  Other Topics Concern   Not on file  Social History Narrative   Not on file   Social Determinants of Health   Financial Resource Strain: Not on file  Food Insecurity: Not on file  Transportation Needs: Not on file  Physical Activity: Not on file  Stress: Not on file  Social Connections: Not on file     Family History: The patient's family history is negative for Colon cancer, Esophageal cancer, Pancreatic cancer, Prostate cancer, Rectal cancer, and Stomach cancer.  ROS:   Please see the history of present illness.    All other systems are reviewed and are negative.   EKGs/Labs/Other Studies Reviewed:    The following studies were reviewed today: Extensive review of notes from his 3 hospitalizations in the last 3 months. Echocardiogram 03/19/2021  Study Result     1. Left ventricular ejection fraction, by estimation, is 55%. The left  ventricle has normal function. The left ventricle has no regional wall  motion abnormalities. There is severe left ventricular hypertrophy. Left  ventricular diastolic parameters are  indeterminate.   2. Right ventricular systolic function is normal. The right ventricular   size is normal.   3. Left atrial size was moderately dilated.   4. The mitral valve is abnormal. Trivial mitral valve regurgitation. No  evidence of mitral stenosis.   5. The aortic valve is tricuspid. Aortic valve regurgitation is not  visualized. Mild to moderate aortic valve sclerosis/calcification is  present, without any evidence of aortic stenosis.   6. The inferior vena cava is normal in size with greater than 50%  respiratory variability, suggesting right atrial pressure of 3 mmHg.     Transesophageal echocardiogram 07/13/2020   1. TEE with no RAA/LAA thrombus DCC x 1 with 200J converted from atrial  flutter rate 133 bpm to NSR rate 88 bpm On eliquis with no immediate  neurologic sequelae.   2. EF decreased with patient in rapid atrial flutter rate 133. Left  ventricular ejection fraction, by estimation, is 45 to 50%. The left  ventricle has mildly decreased function. There is mild left ventricular  hypertrophy.   3. Right ventricular systolic function is severely reduced. The right  ventricular size is severely enlarged.   4. Left atrial size was moderately dilated. No left atrial/left atrial  appendage thrombus was detected.   5. Right atrial size was severely dilated.   6. The mitral valve is normal in structure. Mild mitral valve  regurgitation.   7. Tricuspid valve regurgitation is moderate.   8. The aortic valve is tricuspid. Aortic valve regurgitation is mild.  Mild aortic valve sclerosis is present, with no evidence of aortic valve  stenosis.   EKG:    EKG is  ordered today.  Personally reviewed, shows normal sinus rhythm and right bundle branch block, QTc normal at 464 ms when accounting for the conduction abnormality, not changed from previous tracing  Recent Labs: 03/18/2021: B Natriuretic Peptide 404.6 09/13/2021: TSH 2.885 09/17/2021: Hemoglobin 14.0; Magnesium 2.8; Platelets 209 09/19/2021: BUN 27; Creatinine, Ser 2.03; Potassium 4.4; Sodium 136        Component Value Date/Time   CHOL 103 05/08/2020 0335   TRIG 71 05/08/2020 0335   HDL 27 (L) 05/08/2020 0335  CHOLHDL 3.8 05/08/2020 0335   VLDL 14 05/08/2020 0335   LDLCALC 62 05/08/2020 0335    Physical Exam:    VS:  BP 122/76    Pulse 87    Ht 5\' 7"  (1.702 m)    Wt (!) 312 lb (141.5 kg)    SpO2 100%    BMI 48.87 kg/m     Wt Readings from Last 3 Encounters:  09/25/21 (!) 312 lb (141.5 kg)  09/17/21 (!) 307 lb 12.2 oz (139.6 kg)  06/18/21 (!) 319 lb 6.4 oz (144.9 kg)       General: Alert, oriented x3, no distress, orbitally obese Head: no evidence of trauma, PERRL, EOMI, no exophtalmos or lid lag, no myxedema, no xanthelasma; normal ears, nose and oropharynx Neck: normal jugular venous pulsations and no hepatojugular reflux; brisk carotid pulses without delay and no carotid bruits Chest: clear to auscultation, no signs of consolidation by percussion or palpation, normal fremitus, symmetrical and full respiratory excursions Cardiovascular: normal position and quality of the apical impulse, regular rhythm, normal first and widely split second heart sounds, no murmurs, rubs or gallops Abdomen: no tenderness or distention, no masses by palpation, no abnormal pulsatility or arterial bruits, normal bowel sounds, no hepatosplenomegaly Extremities: He has bilateral lower extremity edema, substantially worse on the left.  There is still an area of roughly 10 cm diameter hematoma that protrudes just inferior and lateral to the patella.  There is no evidence of excessive warmth or tenderness in this area and the hematoma is soft.  The entire left cath is mildly erythematous and mildly cyanotic, with chronic scaly skin lesions,, but without any blisters or weeping wounds.  On the right side he has 2+ heart pitting edema with mild erythema/cyanosis. Neurological: grossly nonfocal Psych: Normal mood and affect    ASSESSMENT:    1. Acute on chronic diastolic heart failure (HCC)   2.  Persistent atrial fibrillation ([place])   3. 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 Left leg swelling   5. Morbid obesity ([place])   6. Prediabetes   7. OSA (obstructive sleep apnea)   8. Pruritic erythematous rash         PLAN:    In order of problems listed above:  CHF: He has almost exclusively if not entirely right heart failure due to cor pulmonale.  He is back on oral diuretics and developed acute kidney injury when intravenous diuresis was attempted.  Again reinforced the importance of sodium restriction and keeping his legs elevated.  Without treatment of the underlying cause (obstructive sleep apnea), his condition will continue to worsen.  Recheck labs today. AFib/typical atrial flutter: Remarkably has been maintaining normal rhythm.  He has been off anticoagulants for few days due to the hematoma in his leg.  Preliminary review of a limited ultrasound performed today does not show evidence of DVT in his left lower extremity which is severely edematous.  The hematoma appears to be stable, possibly shrinking a bit.  We will restart anticoagulation today, indicated for both venous thromboembolic disease and atrial arrhythmia. DVT: No evidence of acute DVT on scan today.  He has organized thrombus and severe postphlebitic syndrome, compounded by right heart failure.  Repeated to him that it is critical that he reduce the swelling in his limbs to avoid recurrent problems with skin breakdown, cellulitis, sepsis.  Recurrent skin infection with further worsen the edema due to compromise of the lymphatic system.  Needs to keep his legs elevated throughout the day.  Sitting at the computer desk with legs dangling is very much counterproductive. Obesity: Unwilling and unable to really participate in regular physical activity.  Need to focus on reduction of calorie intake. Pre-DM: Borderline glucose levels, generally favorable lipid profile except for very  low HDL cholesterol. OSA: We have scheduled him for sleep studies repeatedly, he's never had them done. Rash: Initially felt to be due to amiodarone but has been off this medication for so many months that this is highly doubtful.  We stopped diltiazem and torsemide and he is now taking metoprolol and bumetanide.  The rash seems to be improving, but has not completely resolved.    Medication Adjustments/Labs and Tests Ordered: Current medicines are reviewed at length with the patient today.  Concerns regarding medicines are outlined above.  Orders Placed This Encounter  Procedures   Basic metabolic panel   EKG 46-EVOJ   VAS [place] LOWER EXTREMITY VENOUS (DVT)   VAS [place] LOWER EXTREMITY VENOUS (DVT)       No orders of the defined types were placed in this encounter.       Patient Instructions  Medication Instructions:  No changes *If you need a refill on your cardiac medications before your next appointment, please call your pharmacy*   Lab Work: Your provider would like for you to have the following labs today: BMET  If you have labs (blood work) drawn today and your tests are completely normal, you will receive your results only by: [place] (if you have MyChart) OR A paper copy in the mail If you have any lab test that is abnormal or we need to change your treatment, we will call you to review the results.   Testing/Procedures: Your physician has requested that you have a lower extremity venous duplex. This test is an ultrasound of the veins in the legs or arms. It looks at venous blood flow that carries blood from the heart to the legs or arms. Allow one hour for a Lower Venous exam.  There are no restrictions or special instructions.  Follow-Up: At [facility], you and your health needs are our priority.  As part of our continuing mission to provide you with exceptional heart care, we have created designated Provider Care Teams.  These Care Teams include your  primary Cardiologist (physician) and Advanced Practice Providers (APPs -  Physician Assistants and Nurse Practitioners) who all work together to provide you with the care you need, when you need it.  We recommend signing up for the patient portal called "MyChart".  Sign up information is provided on this After Visit Summary.  MyChart is used to connect with patients for Virtual Visits (Telemedicine).  Patients are able to view lab/test results, encounter notes, upcoming appointments, etc.  Non-urgent messages can be sent to your provider as well.   To learn more about what you can do with MyChart, go to [email or web address].    Your next appointment:   Follow up in one month with Dr. Sallyanne Kuster   Signed, Sanda Klein, MD  09/25/2021 5:34 PM    [place]

## 2021-09-26 LAB — BASIC METABOLIC PANEL
BUN/Creatinine Ratio: 18 (ref 10–24)
BUN: 27 mg/dL (ref 8–27)
CO2: 33 mmol/L — ABNORMAL HIGH (ref 20–29)
Calcium: 9.9 mg/dL (ref 8.6–10.2)
Chloride: 93 mmol/L — ABNORMAL LOW (ref 96–106)
Creatinine, Ser: 1.53 mg/dL — ABNORMAL HIGH (ref 0.76–1.27)
Glucose: 114 mg/dL — ABNORMAL HIGH (ref 70–99)
Potassium: 4.5 mmol/L (ref 3.5–5.2)
Sodium: 143 mmol/L (ref 134–144)
eGFR: 47 mL/min/{1.73_m2} — ABNORMAL LOW (ref >59)

## 2021-10-01 ENCOUNTER — Encounter (HOSPITAL_COMMUNITY): Payer: Self-pay | Admitting: Radiology

## 2021-10-23 ENCOUNTER — Ambulatory Visit (INDEPENDENT_AMBULATORY_CARE_PROVIDER_SITE_OTHER): Payer: Medicare Other | Admitting: Cardiovascular Disease

## 2021-10-23 ENCOUNTER — Encounter: Payer: Self-pay | Admitting: Cardiovascular Disease

## 2021-10-23 ENCOUNTER — Other Ambulatory Visit: Payer: Self-pay

## 2021-10-23 VITALS — BP 122/76 | HR 79 | Ht 67.0 in | Wt 325.2 lb

## 2021-10-23 DIAGNOSIS — L298 Other pruritus: Secondary | ICD-10-CM

## 2021-10-23 DIAGNOSIS — I2781 Cor pulmonale (chronic): Secondary | ICD-10-CM | POA: Diagnosis not present

## 2021-10-23 DIAGNOSIS — I5033 Acute on chronic diastolic (congestive) heart failure: Secondary | ICD-10-CM

## 2021-10-23 DIAGNOSIS — I480 Paroxysmal atrial fibrillation: Secondary | ICD-10-CM

## 2021-10-23 DIAGNOSIS — I82541 Chronic embolism and thrombosis of right tibial vein: Secondary | ICD-10-CM | POA: Diagnosis not present

## 2021-10-23 DIAGNOSIS — R7303 Prediabetes: Secondary | ICD-10-CM

## 2021-10-23 DIAGNOSIS — G4733 Obstructive sleep apnea (adult) (pediatric): Secondary | ICD-10-CM

## 2021-10-23 MED ORDER — POTASSIUM CHLORIDE ER 20 MEQ PO TBCR: 20.0000 meq | EXTENDED_RELEASE_TABLET | Freq: Two times a day (BID) | ORAL | 3 refills | Status: DC

## 2021-10-23 MED ORDER — BUMETANIDE 2 MG PO TABS: 4.0000 mg | ORAL_TABLET | Freq: Two times a day (BID) | ORAL | 3 refills | Status: DC

## 2021-10-23 NOTE — Patient Instructions (Signed)
Medication Instructions:  ?INCREASE the Bumetanide (Bumex) to 4 mg (2 tablets) twice a day ?INCREASE the Potassium to 20 mEq twice a day ? ?*If you need a refill on your cardiac medications before your next appointment, please call your pharmacy* ? ? ?Lab Work: ?None ordered ?If you have labs (blood work) drawn today and your tests are completely normal, you will receive your results only by: ?MyChart Message (if you have MyChart) OR ?A paper copy in the mail ?If you have any lab test that is abnormal or we need to change your treatment, we will call you to review the results. ? ? ?Testing/Procedures: ?None ordered ? ? ?Follow-Up: ?At [facility], you and your health needs are our priority.  As part of our continuing mission to provide you with exceptional heart care, we have created designated Provider Care Teams.  These Care Teams include your primary Cardiologist (physician) and Advanced Practice Providers (APPs -  Physician Assistants and Nurse Practitioners) who all work together to provide you with the care you need, when you need it. ? ?We recommend signing up for the patient portal called "MyChart".  Sign up information is provided on this After Visit Summary.  MyChart is used to connect with patients for Virtual Visits (Telemedicine).  Patients are able to view lab/test results, encounter notes, upcoming appointments, etc.  Non-urgent messages can be sent to your provider as well.   ?To learn more about what you can do with MyChart, go to [email or web address].   ? ?Your next appointment:   ?Follow up in May with Dr. Sallyanne Kuster. ? ?

## 2021-10-23 NOTE — Progress Notes (Signed)
?Cardiology new patient note:   ? ?Date:  10/24/2021  ? ?Shane Diaz, DOB 06-01-1946, MRN [id number] ? ?PCP:  [name], MD  ?Cardiologist:  Sanda Klein, MD New ?Electrophysiologist:  None  ? ?Referring MD: [name], MD  ? ?Chief Complaint  ?Patient presents with  ? Edema  ? ? ? ? ?History of Present Illness:   ? ?Shane Diaz is a 76 y.o. 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 ? ?There has been some gradual resolution of the left anterior pretibial hematoma that led to his hospitalization in February.  On the other hand, he continues to have severe bilateral lower extremity edema and has actually gained another 15 pounds since his last appointment.  We transitioned from torsemide to bumetanide.  He has good urine output when he takes the medication, but he remains noncompliant with sodium restriction.  He loves salty cold cuts and pickled cabbage dishes. ? ?He denies shortness of breath, but for the first time today he was brought into the office in a wheelchair and his oxygen saturation is also low at 90%, although rebounded with time.  He has tight and severe edema of the lower extremities involving his calves and the posterior half of his thighs all the way to the gluteal area.  He does not have any active weeping wounds or ulcerations. ? ?We had previously  estimated his "dry weight" at about 260 pounds, so he is 65 pounds above that. ? ?His wife again reports that he spends all day sitting in a chair at the desktop computer with his legs hanging down.  He does not keep his legs elevated as he should.  His legs are weaker than before and his legs feel very heavy.  He has had a couple of stumbles and falls without loss of consciousness.  His left leg is so heavy that he has difficulty lifting it over the lip of the tub, but he can get his right leg in first. ? ?He is a loud snorer and has clear symptoms of obstructive sleep apnea but again today refuses to consider wearing CPAP.  He has been scheduled for sleep studies repeatedly but has canceled them. ? ?Denies cough hemoptysis orthopnea and PND.  He has not had any chest pain. ? ?He had an estimated dry weight of 260 pounds when he was hospitalized more than a year ago, has had limited success with outpatient oral diuretics.  During the recent hospitalization he was treated with intravenous diuretics, but this had to be discontinued when he developed acute kidney injury.  Creatinine was 1.9 at the time of discharge on 09/17/2021 and was still elevated at 2.0 on 09/19/2021.  Outpatient labs showed improved creatinine down to 1.5. ? ?He still has a rash around his neck, although it seems to be improving, waxing and waning. ? ?Despite his multiple medical problems, the acute hospitalization and the discontinuation of amiodarone, he has remained in normal sinus rhythm.  Occasionally his pulse oximeter will show rapid rates 450, but this is always brief.  He is never aware of palpitations. ? ? ? ?His most recent labs show a borderline hemoglobin A1c of 5.8% with a fairly satisfactory cholesterol profile (very low HDL but also low total and LDL cholesterol). ? ?He had Covid pneumonia requiring hospitalization in October 2021.  He was hospitalized with hypoxemia due to decompensated congestive heart failure and atrial  flutter with rapid ventricular response in early December 2021.   On transthoracic echocardiogram LV EF was 50-55%, but the right ventricle was mildly dilated and mildly depressed.  TEE showed LVEF 45-50% and a severely enlarged and depressed right ventricle with moderate tricuspid regurgitation. He underwent TEE guided cardioversion successfully on 07/13/2020 and discharged on beta-blockers and anticoagulants, but returned and was hospitalized again with similar problems on 08/01/2020.  An attempt at cardioversion on 08/02/2020 was not successful. IV Amiodarone was initiated.  Cardioversion was tried again the next day, again unsuccessfully.  The plan was to continue loading with amiodarone and bring back for cardioversion.  He was seen back in clinic in mid January and declined repeat cardioversion.  He was again hospitalized in February for volume overload complicated by cellulitis of the lower extremities, sepsis, atrial fibrillation rapid ventricular response.  He required fluid resuscitation with worsening edema.  The arrhythmia was managed conservatively and his volume status improved after intravenous diuretics.  At hospital discharge his dry weight was estimated to be 270 pounds.  At that weight, his wife reports that his legs were thin and that he no longer had edema or blisters or skin wounds. ? ?Once the patient returned home, he returned to his old habits of adding salt to his food, eating a lot of processed meats and pickles.  He spends the day sitting in a chair at the computer and has developed gradually worsening lower extremity edema.  His weight has gone up 16 pounds and he has developed a new weeping wound on his right leg and blisters on the back of his left calf.  He has been unable to wear compression stockings and does not like to keep his legs elevated. ? ?He also developed a "lobster red" pruritic rash on his neck and shoulders and lower limbs.  He stopped taking amiodarone 4 days before  this appointment and the rash is beginning to improve.  It is no longer as pruritic.  He has been taking Zyrtec.  He is on a relatively short list of medications, the only relatively new one is indeed amiodarone ? ? ?In 2016, years ago he had a right distal femoral and popliteal DVT, partially resolved by follow-up ultrasound in late 2016 but with chronic occlusion of the right tibioperoneal venous trunk.  About 3 years ago he was having a lot of lower extremity pain related to large varicose veins and underwent venous ablation in his right lower extremity.  He had laser ablation of the left greater saphenous vein and stab phlebectomies of the left leg in November 2016 ? ?Starting in March 2020 he had gradually worsening pain and swelling in the left calf, more so than on the right.  In August of this year he developed severe swelling and discomfort in his left calf and was diagnosed with a DVT and started on Xarelto.  Switched to Eliquis later because of suspected allergy (I suspect this was actually only normal skin changes related to brawny edema).   ?He has a follow-up visit and ultrasound  scheduled with Dr. Scot Dock on March 11. ? ?He denies angina or dyspnea at rest or with his usual activity (he is quite sedentary).  He has not had focal neurological events, syncope, palpitations or serious bleeding problems.  He canceled his sleep study.  It sounds like he would not use CPAP even if we did confirm the diagnosis. ? 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  He has been living in [place] for the last 10 years or so. ? ?Past Medical History:  ?Diagnosis Date  ? Acute diastolic heart failure ([place]) 08/03/2020  ? Arthritis   ? Diverticulosis 05/2015  ? Mild, noted on colonosocpy  ? Fatty liver 05/14/2015  ? noted on [place] ABD  ? History of colon polyps 05/2014  ? sessile in  ascending colon, pedunculated in sigmoid colon, diverticulosis  ? Hyperlipidemia   ? pt denies  ? Hypertension   ? not currently taking medications per MD  ? OSA (obstructive sleep apnea) 08/03/2020  ? Renal c

## 2021-10-24 ENCOUNTER — Encounter: Payer: Self-pay | Admitting: Cardiovascular Disease

## 2021-11-01 ENCOUNTER — Telehealth: Payer: Self-pay | Admitting: Cardiovascular Disease

## 2021-11-01 NOTE — Telephone Encounter (Signed)
Spoke with patient regarding assistance with setting up home health. I asked who ordered home health for him. He said he would have his daughter call us back. ?

## 2021-11-01 NOTE — Telephone Encounter (Signed)
Pts daughter reached out to let us know that she hasn't as of yet set up home health for the patient.. will need assistance in doing so. ?

## 2021-11-12 ENCOUNTER — Other Ambulatory Visit: Payer: Self-pay | Admitting: Cardiovascular Disease

## 2021-11-20 ENCOUNTER — Telehealth: Payer: Self-pay | Admitting: Cardiovascular Disease

## 2021-11-20 NOTE — Progress Notes (Signed)
?  ?Cardiology Office Note ? ? ?Date:  11/21/2021  ? ?IDDakoda Laventure, DOB [birth year], MRN [id number] ? ?PCP:  [name], MD  ?Cardiologist:   Sanda Klein, MD ? ? ?Chief Complaint  ?Patient presents with  ? Palpitations  ? ? ?  ?History of Present Illness: ?Timtohy Broski is a 76 y.o. male who presents for evaluation of tachycardia.  He has a history of PAF.    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 ?  ?He comes in today really because his heart rate he noticed on the 17th was elevated.  Blood pressure has been fine with a heart rate went to the 140s.  He is back in atrial flutter to the 1.  He says he [place] be sent [place] the train station than the hospital.  He would not want to be admitted.  He is not having any new shortness of breath, PND.  He is not having any new chest pressure, neck or arm discomfort.  He is not describing presyncope or syncope.  His weights have been the same.  This is certainly not at his lowest weight which was 260 less than a year ago.  However, it is stable at the 324 mark. ? ?He did not want to do this given the ?Past Medical History:  ?Diagnosis Date  ? Acute diastolic heart failure ([place]) 08/03/2020  ? Arthritis   ? Diverticulosis 05/2015  ? Mild, noted on colonosocpy  ? Fatty liver 05/14/2015  ? noted on [place] ABD  ? History of colon polyps 05/2014  ?  sessile in ascending colon, pedunculated in sigmoid colon, diverticulosis  ? Hyperlipidemia   ? pt denies  ? Hypertension   ? not currently taking medications per MD  ? OSA (obstructive sleep apnea) 08/03/2020  ? Renal cyst 05/2014  ? Small, Left, noted on [place] ABD  ? Umbilical hernia   ? Varicose veins   ? ? ?Past Surgical History:  ?Procedure Laterality Date  ? APPENDECTOMY    ? childhood  ? BIOPSY  07/11/2020  ? Procedure: BIOPSY;  Surgeon: Ladene Artist, MD;  Location: [facility];  Service: Endoscopy;;  ? CARDIOVERSION N/A 07/13/2020  ? Procedure: CARDIOVERSION;  Surgeon: Josue Hector, MD;  Location: [facility] ENDOSCOPY;  Service: Cardiovascular;  Laterality: N/A;  ? CARDIOVERSION N/A 08/02/2020  ? Procedure: CARDIOVERSION;  Surgeon: Werner Lean, MD;  Location: [facility] ENDOSCOPY;  Service: Cardiovascular;  Laterality: N/A;  ? COLONOSCOPY W/ POLYPECTOMY  05/23/2014  ? COLONOSCOPY WITH PROPOFOL N/A 07/11/2020  ? Procedure: COLONOSCOPY WITH PROPOFOL;  Surgeon: Ladene Artist, MD;  Location: [facility] ENDOSCOPY;  Service: Endoscopy;  Laterality: N/A;  ? ESOPHAGOGASTRODUODENOSCOPY (EGD) WITH PROPOFOL N/A 07/11/2020  ? Procedure: ESOPHAGOGASTRODUODENOSCOPY (EGD) WITH PROPOFOL;  Surgeon: Ladene Artist, MD;  Location: [facility] ENDOSCOPY;  Service: Endoscopy;  Laterality: N/A;  ? TEE WITHOUT CARDIOVERSION N/A 07/13/2020  ? Procedure: TRANSESOPHAGEAL ECHOCARDIOGRAM (TEE);  Surgeon: Josue Hector, MD;  Location: [facility] ENDOSCOPY;  Service:  Cardiovascular;  Laterality: N/A;  ? UMBILICAL HERNIA REPAIR N/A 04/27/2018  ? Procedure: REPAIR UMBILICAL HERNIA ERAS PATHWAY;  Surgeon: Alphonsa Overall, MD;  Location: [facility] ORS;  Service: General;  Laterality: N/A;  ? varicose veins    ? WRIST SURGERY    ? ? ? ?Current Outpatient Medications  ?Medication Sig Dispense Refill  ? amiodarone (PACERONE) 200 MG tablet Take 1 tablet (200 mg total) by mouth 2 (two) times daily. 60 tablet 0  ? apixaban (ELIQUIS) 5 MG TABS tablet Take 1 tablet (5 mg total)  by mouth 2 (two) times daily. HOLD until follow up with your doctor 180 tablet 1  ? bumetanide (BUMEX) 2 MG tablet TAKE TWO TABLETS BY MOUTH DAILY 60 tablet 2  ? Magnesium Oxide 400 MG CAPS Take 1 capsule (400 mg total) by mouth daily. 90 capsule 3  ? nitroGLYCERIN (NITROSTAT) 0.4 MG SL tablet Place 1 tablet (0.4 mg total) under the tongue every 5 (five) minutes x 3 doses as needed for chest pain. 25 tablet 4  ? Potassium Chloride ER 20 MEQ TBCR Take 20 mEq by mouth 2 (two) times daily. 60 tablet 3  ? metoprolol succinate (TOPROL-XL) 50 MG 24 hr tablet Take 1 tablet (50 mg total) by mouth in the morning and at bedtime. Take with or immediately following a meal. 60 tablet 1  ? ?No current facility-administered medications for this visit.  ? ? ?Allergies:   Diltiazem and Amiodarone  ? ?ROS:  Please see the history of present illness.   Otherwise, review of systems are positive for none.   All other systems are reviewed and negative.  ? ? ?PHYSICAL EXAM: ?VS:  BP 118/80   Pulse (!) 152   Ht '5\' 7"'$  (1.702 m)   Wt (!) 324 lb 3.2 oz (147.1 kg)   SpO2 90%   BMI 50.78 kg/m?  , BMI Body mass index is 50.78 kg/m?. ?GEN:  No distress ?NECK:  No jugular venous distention at 90 degrees, waveform within normal limits, carotid upstroke brisk and symmetric, no bruits, no thyromegaly, very difficult to assess ?LYMPHATICS:  No cervical adenopathy ?LUNGS:  Clear to auscultation bilaterally ?BACK:  No CVA tenderness ?CHEST:  Unremarkable ?HEART:  S1 and S2 within normal limits, no S3, no S4, no clicks, no rubs, no murmurs, very distant heart sounds ?ABD:  Positive bowel sounds normal in frequency in pitch, no bruits, no rebound, no guarding, unable to assess midline mass or bruit with the patient seated. ?EXT:  2 plus pulses throughout, massive lower extremity edema, no cyanosis no clubbing ?SKIN:  No rashes no nodules ?NEURO:  Cranial nerves II through XII grossly intact, motor grossly intact throughout ?PSYCH:  Cognitively  intact, oriented to person place and time ? ?EKG:  EKG is ordered today. ?The ekg ordered today demonstrates atrial flutter with 2 1 conduction, right bundle branch block ? ? ?Recent Labs: ?03/18/2021: B Natriuretic Peptide 404.6 ?09/13/2021: TSH 2.885 ?09/17/2021: Hemoglobin 14.0; Magnesium 2.8; Platelets 209 ?09/25/2021: BUN 27; Creatinine, Ser 1.53; Potassium 4.5; Sodium 143  ? ? ?Lipid Panel ?   ?Component Value Date/Time  ? CHOL 103 05/08/2020 0335  ? TRIG 71 05/08/2020 0335  ? HDL 27 (L) 05/08/2020 0335  ? CHOLHDL 3.8 05/08/2020 0335  ? VLDL 14 05/08/2020 0335  ? [place] 62 05/08/2020 0335  ? ?  ? ?Wt Readings from Last 3 Encounters:  ?11/21/21 (!) 324 lb 3.2 oz (147.1 kg)  ?10/23/21 (!) 325 lb 3.2 oz (147.5 kg)  ?  09/25/21 (!) 312 lb (141.5 kg)  ?  ? ? ?Other studies Reviewed: ?Additional studies/ records that were reviewed today include: Hospital records. ?Review of the above records demonstrates:  Please see elsewhere in the note.   ? ? ?ASSESSMENT AND PLAN: ? ?CHF/chronic diastolic HF: He certainly has excess volume which is and so difficult to manage as outlined in previous notes.  However, that does not seem to be a big issue now and we are going to clarify that he is only taking 2 mg of Bumex once a day.  I spoke with Dr. Sallyanne Kuster we will call to clarify as there is a little bit of a language barrier. ? ?AFib/typical atrial flutter: He is taking his anticoagulation.  He is in 2-1 flutter.  He would not want to be hospitalized to manage this.  I am going to increase his metoprolol to XL 50 mg twice daily and restart amiodarone 200 mg twice daily which I think is unlikely to be causing his rash.  ? ?DVT: His legs look okay comparatively.  He remains on anticoagulation.  ? ?OSA: He canceled his sleep study and can discuss this further with his primary cardiologist. ? ?Rash: He does have a mild rash in his upper chest but this apparently is better than previous.  It is unlikely that this is related to amiodarone  so again we will restart this medication. ?Was that it is not in the works ? ?Current medicines are reviewed at length with the patient today.  The patient does not have concerns regarding medicines. ? ?The followi

## 2021-11-20 NOTE — Telephone Encounter (Signed)
.  STAT if HR is under 50 or over 120 ?(normal HR is 60-100 beats per minute) ? ?What is your heart rate? 155 ? ?Do you have a log of your heart rate readings (document readings)?  ? ?Do you have any other symptoms? no ? ?

## 2021-11-20 NOTE — Telephone Encounter (Signed)
Spoke with pt regarding increased heart rate. Pt states that for the last 3 days, since Monday his heart rate has been in the 150bpm range. Pt states his heart rate is usually around 80bpm. Pt's blood pressure today is 120/70. Pt states he has no pain. Pt taking all medication as prescribed and has made no changes recently. Pt does have a history of a-fib but at office visit with Dr. Sallyanne Kuster on 09/25/21 he was in sinus rhythm. Pt is on Eliquis.  ? ?Spoke with DOD, who advises that pt should be seen in the office tomorrow by DOD and have an EKG. ? ?Made pt aware of these recommendations and scheduled pt office visit to be seen tomorrow. Pt verbalizes understanding.  ?

## 2021-11-21 ENCOUNTER — Ambulatory Visit (INDEPENDENT_AMBULATORY_CARE_PROVIDER_SITE_OTHER): Payer: Medicare Other | Admitting: Cardiology

## 2021-11-21 ENCOUNTER — Encounter: Payer: Self-pay | Admitting: Cardiology

## 2021-11-21 VITALS — BP 118/80 | HR 152 | Ht 67.0 in | Wt 324.2 lb

## 2021-11-21 DIAGNOSIS — I5032 Chronic diastolic (congestive) heart failure: Secondary | ICD-10-CM

## 2021-11-21 DIAGNOSIS — I4819 Other persistent atrial fibrillation: Secondary | ICD-10-CM | POA: Diagnosis not present

## 2021-11-21 MED ORDER — METOPROLOL SUCCINATE ER 50 MG PO TB24: 50.0000 mg | ORAL_TABLET | Freq: Two times a day (BID) | ORAL | 1 refills | Status: DC

## 2021-11-21 MED ORDER — AMIODARONE HCL 200 MG PO TABS: 200.0000 mg | ORAL_TABLET | Freq: Two times a day (BID) | ORAL | 0 refills | Status: DC

## 2021-11-21 NOTE — Patient Instructions (Signed)
Medication Instructions:  ?START Amiodarone 200 mg twice a day ?INCREASE the Metoprolol Succinate 50 mg twice daily ? ?*If you need a refill on your cardiac medications before your next appointment, please call your pharmacy* ? ? ?Lab Work: ?None ordered ?If you have labs (blood work) drawn today and your tests are completely normal, you will receive your results only by: ?MyChart Message (if you have MyChart) OR ?A paper copy in the mail ?If you have any lab test that is abnormal or we need to change your treatment, we will call you to review the results. ? ? ?Testing/Procedures: ?None ordered ? ? ?Follow-Up: ?At [facility], you and your health needs are our priority.  As part of our continuing mission to provide you with exceptional heart care, we have created designated Provider Care Teams.  These Care Teams include your primary Cardiologist (physician) and Advanced Practice Providers (APPs -  Physician Assistants and Nurse Practitioners) who all work together to provide you with the care you need, when you need it. ? ?We recommend signing up for the patient portal called "MyChart".  Sign up information is provided on this After Visit Summary.  MyChart is used to connect with patients for Virtual Visits (Telemedicine).  Patients are able to view lab/test results, encounter notes, upcoming appointments, etc.  Non-urgent messages can be sent to your provider as well.   ?To learn more about what you can do with MyChart, go to [email or web address].   ? ?Your next appointment:   ?Keep your follow up with Dr. Sallyanne Kuster ? ? ? ?

## 2021-11-22 ENCOUNTER — Other Ambulatory Visit: Payer: Self-pay | Admitting: Cardiovascular Disease

## 2021-11-22 NOTE — Telephone Encounter (Signed)
The original prescription was reordered on 12/14/2020 by Ricci Barker, RN. Renewing this prescription may not be appropriate. ?

## 2021-12-04 ENCOUNTER — Ambulatory Visit: Payer: Medicare Other | Admitting: Cardiovascular Disease

## 2021-12-04 ENCOUNTER — Encounter: Payer: Self-pay | Admitting: Cardiovascular Disease

## 2021-12-04 VITALS — BP 102/70 | HR 133 | Ht 67.0 in | Wt 316.5 lb

## 2021-12-04 DIAGNOSIS — I5032 Chronic diastolic (congestive) heart failure: Secondary | ICD-10-CM | POA: Diagnosis not present

## 2021-12-04 DIAGNOSIS — I87009 Postthrombotic syndrome without complications of unspecified extremity: Secondary | ICD-10-CM | POA: Diagnosis not present

## 2021-12-04 DIAGNOSIS — I4819 Other persistent atrial fibrillation: Secondary | ICD-10-CM

## 2021-12-04 DIAGNOSIS — Z5181 Encounter for therapeutic drug level monitoring: Secondary | ICD-10-CM | POA: Diagnosis not present

## 2021-12-04 DIAGNOSIS — Z79899 Other long term (current) drug therapy: Secondary | ICD-10-CM

## 2021-12-04 DIAGNOSIS — I483 Typical atrial flutter: Secondary | ICD-10-CM | POA: Diagnosis not present

## 2021-12-04 DIAGNOSIS — D6869 Other thrombophilia: Secondary | ICD-10-CM

## 2021-12-04 MED ORDER — AMIODARONE HCL 200 MG PO TABS: 200.0000 mg | ORAL_TABLET | Freq: Every day | ORAL | 1 refills | Status: DC

## 2021-12-04 MED ORDER — MAGNESIUM OXIDE 400 MG PO CAPS: 400.0000 mg | ORAL_CAPSULE | Freq: Every day | ORAL | 3 refills | Status: DC

## 2021-12-04 MED ORDER — BUMETANIDE 2 MG PO TABS: 6.0000 mg | ORAL_TABLET | Freq: Two times a day (BID) | ORAL | 2 refills | Status: DC

## 2021-12-04 MED ORDER — NITROGLYCERIN 0.4 MG SL SUBL: 0.4000 mg | SUBLINGUAL_TABLET | SUBLINGUAL | 4 refills | Status: DC | PRN

## 2021-12-04 NOTE — Patient Instructions (Signed)
Medication Instructions:  ?INCREASE the Bumetanide (Bumex) to 6 mg twice daily (3 of the 2 mg tablets) ? ?*If you need a refill on your cardiac medications before your next appointment, please call your pharmacy* ? ?Follow-Up: ?At [facility], you and your health needs are our priority.  As part of our continuing mission to provide you with exceptional heart care, we have created designated Provider Care Teams.  These Care Teams include your primary Cardiologist (physician) and Advanced Practice Providers (APPs -  Physician Assistants and Nurse Practitioners) who all work together to provide you with the care you need, when you need it. ? ?We recommend signing up for the patient portal called "MyChart".  Sign up information is provided on this After Visit Summary.  MyChart is used to connect with patients for Virtual Visits (Telemedicine).  Patients are able to view lab/test results, encounter notes, upcoming appointments, etc.  Non-urgent messages can be sent to your provider as well.   ?To learn more about what you can do with MyChart, go to [email or web address].   ? ?Your next appointment:   ?Keep your post procedure follow up on 5/18 at 10:40 am ? ?Other Instructions ? ?You are scheduled for a Cardioversion on 12/12/21 with Dr. Harrington Challenger.  Please arrive at the [facility] (Main Entrance A) at [facility]: [address] [place], [place] [address] at 7:30 am. (1 hour prior to procedure unless lab work is needed) ? ?DIET: Nothing to eat or drink after midnight except a sip of water with medications (see medication instructions below) ? ?FYI: For your safety, and to allow [place] to monitor your vital signs accurately during the surgery/procedure we request that   ?if you have artificial nails, gel coating, SNS etc. Please have those removed prior to your surgery/procedure. Not having the nail coverings /polish removed may result in cancellation or delay of your surgery/procedure. ? ? ?Medication  Instructions: ?Hold Bumex the morning of the procedure ? ?Continue your anticoagulant: Eliquis ?You will need to continue your anticoagulant after your procedure until you  are told by your  ?Provider that it is safe to stop ? ? ?Labs: Labs completed 12/04/21 ?You must have a responsible person to drive you home and stay in the waiting area during your procedure. Failure to do so could result in cancellation. ? ?Interior and spatial designer cards. ? ?*Special Note: Every effort is made to have your procedure done on time. Occasionally there are emergencies that occur at the hospital that may cause delays. Please be patient if a delay does occur.  ? ? ? ?

## 2021-12-05 ENCOUNTER — Encounter (HOSPITAL_COMMUNITY): Payer: Self-pay | Admitting: Internal Medicine

## 2021-12-05 LAB — BASIC METABOLIC PANEL
BUN/Creatinine Ratio: 13 (ref 10–24)
BUN: 18 mg/dL (ref 8–27)
CO2: 34 mmol/L — ABNORMAL HIGH (ref 20–29)
Calcium: 9.9 mg/dL (ref 8.6–10.2)
Chloride: 91 mmol/L — ABNORMAL LOW (ref 96–106)
Creatinine, Ser: 1.38 mg/dL — ABNORMAL HIGH (ref 0.76–1.27)
Glucose: 132 mg/dL — ABNORMAL HIGH (ref 70–99)
Potassium: 3.8 mmol/L (ref 3.5–5.2)
Sodium: 139 mmol/L (ref 134–144)
eGFR: 53 mL/min/{1.73_m2} — ABNORMAL LOW (ref >59)

## 2021-12-05 LAB — CBC
Hematocrit: 48.7 % (ref 37.5–51.0)
Hemoglobin: 16 g/dL (ref 13.0–17.7)
MCH: 30.7 pg (ref 26.6–33.0)
MCHC: 32.9 g/dL (ref 31.5–35.7)
MCV: 94 fL (ref 79–97)
Platelets: 177 10*3/uL (ref 150–450)
RBC: 5.21 x10E6/uL (ref 4.14–5.80)
RDW: 13.1 % (ref 11.6–15.4)
WBC: 7.7 10*3/uL (ref 3.4–10.8)

## 2021-12-05 LAB — BRAIN NATRIURETIC PEPTIDE: BNP: 106.3 pg/mL — ABNORMAL HIGH (ref 0.0–100.0)

## 2021-12-07 ENCOUNTER — Encounter: Payer: Self-pay | Admitting: Cardiovascular Disease

## 2021-12-07 DIAGNOSIS — I87009 Postthrombotic syndrome without complications of unspecified extremity: Secondary | ICD-10-CM | POA: Insufficient documentation

## 2021-12-07 NOTE — Progress Notes (Signed)
?Cardiology new patient note:   ? ?Date:  12/07/2021  ? ?IDBennet Diaz, DOB 1945-10-05, MRN [id number] ? ?PCP:  [name], MD  ?Cardiologist:  Sanda Klein, MD New ?Electrophysiologist:  None  ? ?Referring MD: [name], MD  ? ?Chief Complaint  ?Patient presents with  ? Atrial Flutter  ? ? ? ? ?History of Present Illness:   ? ?Shane Diaz is a 76 y.o. 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 ? ?He came back to the office on 11/21/2021 due to rapid heart rates and had atrial flutter with 2: 1 AV block and a ventricular rate of 152 bpm.  Amiodarone was started.  (This had been stopped more than a year ago when he developed a rash.  The rash never disappeared despite stopping the medication). ? ?He returns today and is still in atrial flutter, but the ventricular rate is down to 138 bpm.  This is not due to variable AV block, but rather due to a prolongation of the atrial flutter cycle length.  He could not tolerate amiodarone 400 mg daily due to nausea and abdominal cramps and is down to 200 mg daily already.  He remains compliant with Eliquis.  He has not had any new bleeding events.  He is also taking metoprolol succinate 50 mg daily, the dose cannot be increased due to hypotension. ? ?He continues to have severe lower extremity edema, although currently  without any blisters or weeping wounds.  His BMI is almost 50, but he is probably carrying around more than 50 pounds of extra fluid, in addition to his morbid obesity.  He has lost about 8 pounds of fluid since his last appointment 2 weeks ago.  The edema is no longer all the way up to his thighs. We had previously estimated his "dry weight" at about 260 pounds. ? ?Denies shortness of breath, but at each of his last 3 office appointments his oxygen saturation has been around 90% on room air (today 92%).  He denies cough hemoptysis orthopnea  PND and chest pain. ? ?He is a loud snorer and has clear symptoms of obstructive sleep apnea but again today refuses to consider wearing CPAP.  He has been scheduled for sleep studies repeatedly, but has canceled them.  Even if we get him to have a sleep study, I am very skeptical he would ever wear CPAP. ? ?He had an estimated dry weight of 260 pounds when he was hospitalized in 2021, has had limited success with outpatient oral diuretics.  During the most recent hospitalization he was treated with intravenous diuretics, but this had to be discontinued when he developed acute kidney injury.  Creatinine was 1.9 at the time of discharge on 09/17/2021 and was still elevated at 2.0 on 09/19/2021.  Outpatient labs showed improved creatinine down to 1.5. ? ?He still has a rash around his neck, although it seems to be improving, waxing and waning. ? ?His most recent  labs show a borderline hemoglobin A1c of 5.8% with a fairly satisfactory cholesterol profile (very low HDL but also low total and LDL cholesterol). ? ?He had Covid pneumonia requiring hospitalization in October 2021.  He was hospitalized with hypoxemia due to decompensated congestive heart failure and atrial flutter with rapid ventricular response in early December 2021.   On transthoracic echocardiogram LV EF was 50-55%, but the right ventricle was mildly dilated and mildly depressed.  TEE showed LVEF 45-50% and a  severely enlarged and depressed right ventricle with moderate tricuspid regurgitation. He underwent TEE guided cardioversion successfully on 07/13/2020 and discharged on beta-blockers and anticoagulants, but returned and was hospitalized again with similar problems on 08/01/2020.  An attempt at cardioversion on 08/02/2020 was not successful. IV Amiodarone was initiated.  Cardioversion was tried again the next day, again unsuccessfully.  The plan was to continue loading with amiodarone and bring back for cardioversion.  He was seen back in clinic in mid January and declined repeat cardioversion.  He was again hospitalized in February for volume overload complicated by cellulitis of the lower extremities, sepsis, atrial fibrillation rapid ventricular response.  He required fluid resuscitation with worsening edema.  The arrhythmia was managed conservatively and his volume status improved after intravenous diuretics.  At hospital discharge his dry weight was estimated to be 270 pounds.  At that weight, his wife reports that his legs were thin and that he no longer had edema or blisters or skin wounds. ? ?Once the patient returned home, he returned to his old habits of adding salt to his food, eating a lot of processed meats and pickles.  He spends the day sitting in a chair at the computer and has developed gradually worsening lower extremity edema.  His weight has gone up 16 pounds and he has developed a new weeping wound on his right leg and blisters on the back of his left calf.  He has been unable to wear compression stockings and does not like to keep his legs elevated. ? ?He also developed a "lobster red" pruritic rash on his neck and shoulders and lower limbs.  He stopped taking amiodarone 4 days before this appointment and the rash is beginning to improve.  It is no longer as pruritic.  He has been taking Zyrtec.  He is on a relatively short list of medications, the only relatively new one is indeed  amiodarone ? ? ?In 2016, years ago he had a right distal femoral and popliteal DVT, partially resolved by follow-up ultrasound in late 2016 but with chronic occlusion of the right tibioperoneal venous trunk.  About 3 years ago he was having a lot of lower extremity pain related to large varicose veins and underwent venous ablation in his right lower extremity.  He had laser ablation of the left greater saphenous vein and stab phlebectomies of the left leg in November 2016 ? ?Starting in March 2020 he had gradually worsening pain and swelling in the left calf, more so than on the right.  In August of this year he developed severe swelling and discomfort in his left calf and was diagnosed with a DVT and started on Xarelto.  Switched to Eliquis later because of suspected allergy (I suspect this was actually only normal skin changes related to brawny edema).   ?He has a follow-up visit and ultrasound scheduled with Dr. Scot Dock on March 11. ? ?He denies angina or dyspnea at rest or with his usual activity (he is quite sedentary).  He has  not had focal neurological events, syncope, palpitations or serious bleeding problems.  He canceled his sleep study.  It sounds like he would not use CPAP even if we did confirm the diagnosis. ? 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  He has been living in [place] for the last 10 years or so. ? ?Past Medical History:  ?Diagnosis Date  ? Acute diastolic heart failure ([place]) 08/03/2020  ? Arthritis   ? Diverticulosis 05/2015  ? Mild, noted on colonosocpy  ? Fatty liver 05/14/2015  ? noted on [place] ABD  ? History of colon polyps 05/2014  ? sessile in ascending colon, pedunculated in sigmoid colon, diverticulosis  ? Hyperlipidemia   ? pt denies  ? Hypertension   ? not currently taking medications per MD  ? OSA (obstructive sleep apnea) 08/03/2020  ?  Renal cyst 05/2014  ? Small, Left, noted on [place] ABD  ? Umbilical hernia   ? Varicose veins   ? ? ?Past Surgical History:  ?Procedure Laterality Date  ? APPENDECTOMY    ? childhood  ? BIOPSY  07/11/2020  ? Procedure: BIOPSY;

## 2021-12-11 ENCOUNTER — Ambulatory Visit: Payer: Medicare Other | Admitting: Cardiovascular Disease

## 2021-12-12 ENCOUNTER — Ambulatory Visit (HOSPITAL_COMMUNITY)
Admission: RE | Admit: 2021-12-12 | Discharge: 2021-12-12 | Disposition: A | Discharge status: Home / Self Care | Payer: Medicare Other | Attending: Internal Medicine | Admitting: Internal Medicine

## 2021-12-12 ENCOUNTER — Encounter (HOSPITAL_COMMUNITY): Payer: Self-pay | Admitting: Internal Medicine

## 2021-12-12 ENCOUNTER — Ambulatory Visit (HOSPITAL_COMMUNITY): Payer: Medicare Other | Admitting: Certified Registered"

## 2021-12-12 ENCOUNTER — Ambulatory Visit (HOSPITAL_BASED_OUTPATIENT_CLINIC_OR_DEPARTMENT_OTHER): Payer: Medicare Other | Admitting: Certified Registered"

## 2021-12-12 ENCOUNTER — Other Ambulatory Visit: Payer: Self-pay

## 2021-12-12 ENCOUNTER — Encounter (HOSPITAL_COMMUNITY)
Admission: RE | Disposition: A | Discharge status: Home / Self Care | Payer: Self-pay | Source: Home / Self Care | Attending: Internal Medicine

## 2021-12-12 DIAGNOSIS — I2510 Atherosclerotic heart disease of native coronary artery without angina pectoris: Secondary | ICD-10-CM | POA: Insufficient documentation

## 2021-12-12 DIAGNOSIS — I4892 Unspecified atrial flutter: Secondary | ICD-10-CM | POA: Diagnosis present

## 2021-12-12 DIAGNOSIS — I82541 Chronic embolism and thrombosis of right tibial vein: Secondary | ICD-10-CM | POA: Insufficient documentation

## 2021-12-12 DIAGNOSIS — I82532 Chronic embolism and thrombosis of left popliteal vein: Secondary | ICD-10-CM | POA: Diagnosis not present

## 2021-12-12 DIAGNOSIS — Z6841 Body Mass Index (BMI) 40.0 and over, adult: Secondary | ICD-10-CM | POA: Insufficient documentation

## 2021-12-12 DIAGNOSIS — Z79899 Other long term (current) drug therapy: Secondary | ICD-10-CM | POA: Insufficient documentation

## 2021-12-12 DIAGNOSIS — I110 Hypertensive heart disease with heart failure: Secondary | ICD-10-CM

## 2021-12-12 DIAGNOSIS — I5032 Chronic diastolic (congestive) heart failure: Secondary | ICD-10-CM | POA: Insufficient documentation

## 2021-12-12 DIAGNOSIS — I872 Venous insufficiency (chronic) (peripheral): Secondary | ICD-10-CM | POA: Diagnosis not present

## 2021-12-12 DIAGNOSIS — M1990 Unspecified osteoarthritis, unspecified site: Secondary | ICD-10-CM | POA: Diagnosis not present

## 2021-12-12 DIAGNOSIS — N289 Disorder of kidney and ureter, unspecified: Secondary | ICD-10-CM | POA: Diagnosis not present

## 2021-12-12 DIAGNOSIS — Z7901 Long term (current) use of anticoagulants: Secondary | ICD-10-CM | POA: Diagnosis not present

## 2021-12-12 DIAGNOSIS — I509 Heart failure, unspecified: Secondary | ICD-10-CM

## 2021-12-12 DIAGNOSIS — G4733 Obstructive sleep apnea (adult) (pediatric): Secondary | ICD-10-CM | POA: Insufficient documentation

## 2021-12-12 HISTORY — PX: CARDIOVERSION: SHX1299

## 2021-12-12 SURGERY — CARDIOVERSION
Anesthesia: General

## 2021-12-12 MED ORDER — APIXABAN 5 MG PO TABS
5.0000 mg | ORAL_TABLET | Freq: Once | ORAL | Status: AC
  Administered 2021-12-12: 5 mg via ORAL
  Filled 2021-12-12: qty 1

## 2021-12-12 MED ORDER — SODIUM CHLORIDE 0.9 % IV SOLN: INTRAVENOUS | Status: DC

## 2021-12-12 MED ORDER — PROPOFOL 10 MG/ML IV BOLUS
INTRAVENOUS | Status: DC | PRN
Start: 2021-12-12 — End: 2021-12-12
  Administered 2021-12-12: 60 mg via INTRAVENOUS

## 2021-12-12 MED ORDER — SODIUM CHLORIDE 0.9 % IV SOLN
INTRAVENOUS | Status: AC | PRN
  Administered 2021-12-12: 500 mL via INTRAVENOUS

## 2021-12-12 MED ORDER — LIDOCAINE 2% (20 MG/ML) 5 ML SYRINGE
INTRAMUSCULAR | Status: DC | PRN
Start: 2021-12-12 — End: 2021-12-12
  Administered 2021-12-12: 40 mg via INTRAVENOUS

## 2021-12-12 NOTE — CV Procedure (Signed)
CARDIOVERSION ? ? ?Indication:   Atrial flutter  ? ? ?Patient sedated by anesthesia with 60 mg propofol and 40 mg lidocaine ?With pads in apex/ base position patient cardioverted to SR with  200 J synchronized biphasic energy ?To SR       ?Procedure without complication    ?12 lead EKG pending  ? ?Patient to get AM Eliquis right after procedure  ? ?Dorris Carnes MD  ?

## 2021-12-12 NOTE — Anesthesia Postprocedure Evaluation (Signed)
Anesthesia Post Note ? ?Patient: Shane Diaz ? ?Procedure(s) Performed: CARDIOVERSION ? ?  ? ?Patient location during evaluation: PACU ?Anesthesia Type: General ?Level of consciousness: awake and alert ?Pain management: pain level controlled ?Vital Signs Assessment: post-procedure vital signs reviewed and stable ?Respiratory status: spontaneous breathing, nonlabored ventilation, respiratory function stable and patient connected to nasal cannula oxygen ?Cardiovascular status: blood pressure returned to baseline and stable ?Postop Assessment: no apparent nausea or vomiting ?Anesthetic complications: no ? ? ?No notable events documented. ? ?Last Vitals:  ?Vitals:  ? 12/12/21 0838 12/12/21 0848  ?BP: 96/63 105/74  ?Pulse: 69 71  ?Resp: (!) 25 (!) 27  ?Temp: (!) 36.3 ?C   ?SpO2: 99% 90%  ?  ?Last Pain:  ?Vitals:  ? 12/12/21 0848  ?TempSrc:   ?PainSc: 0-No pain  ? ? ?  ?  ?  ?  ?  ?  ? ?[name] ? ? ? ? ?

## 2021-12-12 NOTE — Anesthesia Preprocedure Evaluation (Addendum)
Anesthesia Evaluation  ?Patient identified by MRN, date of birth, ID band ?Patient awake ? ? ? ?Reviewed: ?Allergy & Precautions, NPO status , Patient's Chart, lab work & pertinent test results ? ?Airway ?Mallampati: III ? ?TM Distance: <3 FB ?Neck ROM: Full ? ? ? Dental ? ?(+) Teeth Intact, Dental Advisory Given ?  ?Pulmonary ?sleep apnea and Continuous Positive Airway Pressure Ventilation ,  ?  ?breath sounds clear to auscultation ? ? ? ? ? ? Cardiovascular ?hypertension, Pt. on home beta blockers ?+ CAD and +CHF  ? ?Rhythm:Irregular Rate:Abnormal ? ?Echo: ??1. Left ventricular ejection fraction, by estimation, is 55%. The left  ?ventricle has normal function. The left ventricle has no regional wall  ?motion abnormalities. There is severe left ventricular hypertrophy. Left  ?ventricular diastolic parameters are  ?indeterminate.  ??2. Right ventricular systolic function is normal. The right ventricular  ?size is normal.  ??3. Left atrial size was moderately dilated.  ??4. The mitral valve is abnormal. Trivial mitral valve regurgitation. No  ?evidence of mitral stenosis.  ??5. The aortic valve is tricuspid. Aortic valve regurgitation is not  ?visualized. Mild to moderate aortic valve sclerosis/calcification is  ?present, without any evidence of aortic stenosis.  ??6. The inferior vena cava is normal in size with greater than 50%  ?respiratory variability, suggesting right atrial pressure of 3 mmHg.  ?  ?Neuro/Psych ?negative neurological ROS ? negative psych ROS  ? GI/Hepatic ?negative GI ROS, Neg liver ROS,   ?Endo/Other  ?negative endocrine ROS ? Renal/GU ?Renal disease  ? ?  ?Musculoskeletal ? ?(+) Arthritis ,  ? Abdominal ?(+) + obese,   ?Peds ? Hematology ?  ?Anesthesia Other Findings ? ? Reproductive/Obstetrics ? ?  ? ? ? ? ? ? ? ? ? ? ? ? ? ?  ?  ? ? ? ? ? ? ? ?Anesthesia Physical ?Anesthesia Plan ? ?ASA: 3 ? ?Anesthesia Plan: General  ? ?Post-op Pain Management:    ? ?Induction: Intravenous ? ?PONV Risk Score and Plan: 0 ? ?Airway Management Planned: Natural Airway and Simple Face Mask ? ?Additional Equipment: None ? ?Intra-op Plan:  ? ?Post-operative Plan:  ? ?Informed Consent: I have reviewed the patients History and Physical, chart, labs and discussed the procedure including the risks, benefits and alternatives for the proposed anesthesia with the patient or authorized representative who has indicated his/her understanding and acceptance.  ? ? ? ? ? ?Plan Discussed with: CRNA ? ?Anesthesia Plan Comments:   ? ? ? ? ? ? ?Anesthesia Quick Evaluation ? ?

## 2021-12-12 NOTE — Discharge Instructions (Signed)

## 2021-12-12 NOTE — Transfer of Care (Signed)
Immediate Anesthesia Transfer of Care Note ? ?Patient: Shane Diaz ? ?Procedure(s) Performed: CARDIOVERSION ? ?Patient Location: PACU and Cath Lab ? ?Anesthesia Type:General ? ?Level of Consciousness: patient cooperative and responds to stimulation ? ?Airway & Oxygen Therapy: Patient Spontanous Breathing ? ?Post-op Assessment: Report given to RN and Post -op Vital signs reviewed and stable ? ?Post vital signs: Reviewed and stable ? ?Last Vitals:  ?Vitals Value Taken Time  ?BP    ?Temp    ?Pulse    ?Resp    ?SpO2    ? ? ?Last Pain:  ?Vitals:  ? 12/12/21 0747  ?TempSrc: Temporal  ?PainSc: 0-No pain  ?   ? ?  ? ?Complications: No notable events documented. ?

## 2021-12-17 ENCOUNTER — Other Ambulatory Visit: Payer: Self-pay

## 2021-12-17 MED ORDER — AMIODARONE HCL 200 MG PO TABS: 200.0000 mg | ORAL_TABLET | Freq: Every day | ORAL | 1 refills | Status: DC

## 2021-12-18 NOTE — H&P (Signed)
Date:  12/18/2021   ID:  [name], DOB [birth year], MRN [id number]  PCP:  [name], MD  Cardiologist:  Sanda Klein, MD New Electrophysiologist:  None    History of Present Illness:    Shane Diaz is a 76 y.o. 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  He came back to the office on 11/21/2021 due to rapid heart rates and had atrial flutter with 2: 1 AV block and a ventricular rate of 152 bpm.  Amiodarone was started.  (This had been stopped more than a year ago when he developed a rash.  The rash never disappeared despite stopping the medication).  He was seen in clinic on 12/04/21 and was  still in atrial flutter, with a ventricular rate is down to 138 bpm.    He could not tolerate amiodarone 400 mg daily due to nausea and abdominal cramps and is down to 200 mg daily already.  He remains compliant with Eliquis.  He has not had any new bleeding events.  He is also taking metoprolol succinate 50 mg daily, the dose cannot be increased due to hypotension.   Pt presents today for cardioversoin  Past Medical History:  Diagnosis Date   Acute diastolic heart failure ([place]) 08/03/2020   Arthritis    Diverticulosis 05/2015   Mild, noted on colonosocpy   Fatty liver 05/14/2015   noted on [place] ABD   History of colon polyps 05/2014   sessile in  ascending colon, pedunculated in sigmoid colon, diverticulosis   Hyperlipidemia    pt denies   Hypertension    not currently taking medications per MD   OSA (obstructive sleep apnea) 08/03/2020   Renal cyst 05/2014   Small, Left, noted on [place] ABD   Umbilical hernia    Varicose veins     Past Surgical History:  Procedure Laterality Date   APPENDECTOMY     childhood   BIOPSY  07/11/2020   Procedure: BIOPSY;  Surgeon: Ladene Artist, MD;  Location: [facility];  Service: Endoscopy;;   CARDIOVERSION N/A 07/13/2020   Procedure: CARDIOVERSION;  Surgeon: Josue Hector, MD;  Location: [facility];  Service: Cardiovascular;  Laterality: N/A;   CARDIOVERSION N/A 08/02/2020   Procedure: CARDIOVERSION;  Surgeon: Werner Lean, MD;  Location: [facility] ENDOSCOPY;  Service: Cardiovascular;  Laterality: N/A;   CARDIOVERSION N/A 12/12/2021   Procedure: CARDIOVERSION;  Surgeon: Fay Records, MD;  Location: [facility];  Service: Cardiovascular;  Laterality: N/A;   COLONOSCOPY W/ POLYPECTOMY  05/23/2014   COLONOSCOPY WITH PROPOFOL N/A 07/11/2020   Procedure: COLONOSCOPY WITH PROPOFOL;  Surgeon: Ladene Artist, MD;  Location[facility] ENDOSCOPY;  Service: Endoscopy;  Laterality: N/A;   ESOPHAGOGASTRODUODENOSCOPY (EGD) WITH PROPOFOL N/A 07/11/2020   Procedure: ESOPHAGOGASTRODUODENOSCOPY (EGD) WITH PROPOFOL;  Surgeon: Ladene Artist, MD;  Location: [facility] ENDOSCOPY;  Service: Endoscopy;  Laterality: N/A;   TEE WITHOUT CARDIOVERSION N/A 07/13/2020   Procedure: TRANSESOPHAGEAL ECHOCARDIOGRAM (TEE);  Surgeon: Johnsie Cancel,  Wallis Bamberg, MD;  Location: [facility] ENDOSCOPY;  Service: Cardiovascular;  Laterality: N/A;   UMBILICAL HERNIA REPAIR N/A 04/27/2018   Procedure: REPAIR UMBILICAL HERNIA ERAS PATHWAY;  Surgeon: Alphonsa Overall, MD;  Location: [facility];  Service: General;  Laterality: N/A;   varicose veins     WRIST SURGERY      Current Medications: Current Meds  Medication Sig   apixaban (ELIQUIS) 5 MG TABS tablet Take  1 tablet (5 mg total) by mouth 2 (two) times daily. HOLD until follow up with your doctor     Allergies:   Diltiazem and Amiodarone   Social History   Socioeconomic History   Marital status: Married    Spouse name: Not on file   Number of children: 0   Years of education: Not on file   Highest education level: Not on file  Occupational History   Not on file  Tobacco Use   Smoking status: Never   Smokeless tobacco: Never  Vaping Use   Vaping Use: Never used  Substance and Sexual Activity   Alcohol use: Yes    Alcohol/week: 0.0 standard drinks    Comment: occassional wine, martini   Drug use: No   Sexual activity: Not on file  Other Topics Concern   Not on file  Social History Narrative   Not on file   Social Determinants of Health   Financial Resource Strain: Not on file  Food Insecurity: Not on file  Transportation Needs: Not on file  Physical Activity: Not on file  Stress: Not on file  Social Connections: Not on file     Family History: The patient's family history is negative for Colon cancer, Esophageal cancer, Pancreatic cancer, Prostate cancer, Rectal cancer, and Stomach cancer.  ROS:   Please see the history of present illness.    All other systems are reviewed and are negative.   EKGs/Labs/Other Studies Reviewed:    Echocardiogram 03/19/2021  Study Result     1. Left ventricular ejection fraction, by estimation, is 55%. The left  ventricle has normal function. The left ventricle has no regional wall  motion abnormalities. There is severe left ventricular hypertrophy. Left  ventricular diastolic parameters are  indeterminate.   2. Right ventricular systolic function is normal. The right ventricular  size is normal.   3. Left atrial size was moderately dilated.   4. The mitral valve is abnormal. Trivial mitral valve regurgitation. No  evidence of mitral stenosis.   5. The aortic valve is tricuspid. Aortic valve regurgitation is not  visualized.  Mild to moderate aortic valve sclerosis/calcification is  present, without any evidence of aortic stenosis.   6. The inferior vena cava is normal in size with greater than 50%  respiratory variability, suggesting right atrial pressure of 3 mmHg.     Transesophageal echocardiogram 07/13/2020   1. TEE with no RAA/LAA thrombus DCC x 1 with 200J converted from atrial  flutter rate 133 bpm to NSR rate 88 bpm On eliquis with no immediate  neurologic sequelae.   2. EF decreased with patient in rapid atrial flutter rate 133. Left  ventricular ejection fraction, by estimation, is 45 to 50%. The left  ventricle has mildly decreased function. There is mild left ventricular  hypertrophy.   3. Right ventricular systolic function is severely reduced. The right  ventricular size is severely enlarged.   4. Left atrial size was moderately dilated. No left atrial/left atrial  appendage thrombus was detected.   5. Right atrial size was  severely dilated.   6. The mitral valve is normal in structure. Mild mitral valve  regurgitation.   7. Tricuspid valve regurgitation is moderate.   8. The aortic valve is tricuspid. Aortic valve regurgitation is mild.  Mild aortic valve sclerosis is present, with no evidence of aortic valve  stenosis.         Component Value Date/Time   CHOL 103 05/08/2020 0335   TRIG 71 05/08/2020 0335   HDL 27 (L) 05/08/2020 0335   CHOLHDL 3.8 05/08/2020 0335   VLDL 14 05/08/2020 0335   LDLCALC 62 05/08/2020 0335    Physical Exam:    VS:  BP 105/74   Pulse 71   Temp (!) 97.3 F (36.3 C) (Temporal)   Resp (!) 27   Ht '5\' 7"'$  (1.702 m)   Wt (!) 143.6 kg   SpO2 90%   BMI 49.58 kg/m     Wt Readings from Last 3 Encounters:  12/12/21 (!) 143.6 kg  12/04/21 (!) 143.6 kg  11/21/21 (!) 147.1 kg     General: Alert, oriented x3, no distress, morbidly obese Head: no evidence of trauma, PERRL, EOMI, Neck: normal jugular venous pulsations  Chest: clear to  auscultation, Cardiovascular: irreg rate rhythm Abdomen: no tenderness or distention,  Extremities: 1+ pitting edema Neurological: grossly nonfocal Psych: Normal mood and aff  PLAN:    In order of problems listed above:  Atrial flutter: Did not tolerate loading dose of amiodarone but is taking 200 mg a day without side effects.  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  Presents today for cardioversion  This procedure has been fully reviewed with the patient and written informed consent has been obtained.  He is compliant with Eliquis.        Signed, Dorris Carnes, MD  12/18/2021 3:10 PM    [place]

## 2021-12-19 ENCOUNTER — Ambulatory Visit (INDEPENDENT_AMBULATORY_CARE_PROVIDER_SITE_OTHER): Payer: Medicare Other | Admitting: Cardiovascular Disease

## 2021-12-19 ENCOUNTER — Encounter: Payer: Self-pay | Admitting: Cardiovascular Disease

## 2021-12-19 VITALS — BP 118/72 | HR 73 | Ht 67.0 in | Wt 333.0 lb

## 2021-12-19 DIAGNOSIS — I872 Venous insufficiency (chronic) (peripheral): Secondary | ICD-10-CM | POA: Diagnosis not present

## 2021-12-19 DIAGNOSIS — I5032 Chronic diastolic (congestive) heart failure: Secondary | ICD-10-CM | POA: Diagnosis not present

## 2021-12-19 DIAGNOSIS — G4733 Obstructive sleep apnea (adult) (pediatric): Secondary | ICD-10-CM

## 2021-12-19 DIAGNOSIS — Z79899 Other long term (current) drug therapy: Secondary | ICD-10-CM

## 2021-12-19 DIAGNOSIS — I483 Typical atrial flutter: Secondary | ICD-10-CM

## 2021-12-19 DIAGNOSIS — L298 Other pruritus: Secondary | ICD-10-CM

## 2021-12-19 DIAGNOSIS — Z5181 Encounter for therapeutic drug level monitoring: Secondary | ICD-10-CM

## 2021-12-19 DIAGNOSIS — I87009 Postthrombotic syndrome without complications of unspecified extremity: Secondary | ICD-10-CM

## 2021-12-19 DIAGNOSIS — R7303 Prediabetes: Secondary | ICD-10-CM

## 2021-12-19 MED ORDER — METOPROLOL SUCCINATE ER 50 MG PO TB24: 50.0000 mg | ORAL_TABLET | Freq: Every day | ORAL | 1 refills | Status: DC

## 2021-12-19 NOTE — Patient Instructions (Signed)
Medication Instructions:  Take Amiodarone at night  DECREASE the Metoprolol Succinate to 50 mg once daily at night  START the Bumex 6 mg (3 tablets) twice daily  *If you need a refill on your cardiac medications before your next appointment, please call your pharmacy*   Lab Work: None ordered If you have labs (blood work) drawn today and your tests are completely normal, you will receive your results only by: [place] (if you have MyChart) OR A paper copy in the mail If you have any lab test that is abnormal or we need to change your treatment, we will call you to review the results.   Testing/Procedures: None ordered   Follow-Up: At [facility], you and your health needs are our priority.  As part of our continuing mission to provide you with exceptional heart care, we have created designated Provider Care Teams.  These Care Teams include your primary Cardiologist (physician) and Advanced Practice Providers (APPs -  Physician Assistants and Nurse Practitioners) who all work together to provide you with the care you need, when you need it.  We recommend signing up for the patient portal called "MyChart".  Sign up information is provided on this After Visit Summary.  MyChart is used to connect with patients for Virtual Visits (Telemedicine).  Patients are able to view lab/test results, encounter notes, upcoming appointments, etc.  Non-urgent messages can be sent to your provider as well.   To learn more about what you can do with MyChart, go to [email or web address].    Your next appointment:   6 week(s)  The format for your next appointment:   In Person  Provider:   Sanda Klein, MD {   Important Information About Sugar

## 2021-12-23 ENCOUNTER — Encounter: Payer: Self-pay | Admitting: Cardiovascular Disease

## 2021-12-23 NOTE — Progress Notes (Signed)
Cardiology new patient note:    Date:  12/23/2021   ID:  Shane Diaz, DOB [birth year], MRN [id number]  PCP:  [name], MD  Cardiologist:  Sanda Klein, MD New Electrophysiologist:  None   Referring MD: [name], MD   Chief Complaint  Patient presents with   Atrial Flutter   Congestive Heart Failure           History of Present Illness:    Shane Diaz is a 76 y.o. 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  He came back to the office on 11/21/2021 due to rapid heart rates and had atrial flutter with 2: 1 AV block and a ventricular rate of 152 bpm.  Amiodarone was started.  (This had been stopped more than a year ago when he developed a rash.  The rash never disappeared despite stopping the medication).   2 weeks after starting amiodarone he underwent elective electrical cardioversion, successfully on 12/12/2021.  He returns to the office today and maintained sinus rhythm with mild first-degree AV block (222 ms).  Despite this there has not been a meaningful change in his overall hemodynamic status.  In fact he has actually gained fluid and now weighs 333 pounds (estimated dry weight is somewhere around 260-280 pounds).  He has severe lower extremity edema to above the knees with hard pitting.  He does not have any active oozing or  blisters.  There is no evidence of cellulitis at this time.  He has not had falls or bleeding problems and reports compliance with Eliquis.  The rash around his neck does not appear to be any worse since restarting amiodarone.  In fact it might be a little better.  Often feels washed out and very tired a couple of hours after taking his morning medications.  He denies shortness of breath but has chronic hypoxemia with oxygen saturation around 90-92% on room air.  He does not have orthopnea, PND, cough, hemoptysis or chest pain.  Despite loud snoring, prominent daytime hypersomnolence and other symptoms of obstructive sleep apnea he has declined to undergo sleep studies (these have been scheduled but he has always canceled them) and will not wear CPAP.  He had an estimated dry weight of 260 pounds when he was hospitalized in 2021, has had limited success with outpatient oral diuretics.  During the most recent hospitalization he was treated with intravenous diuretics, but this had to be discontinued when he developed acute kidney injury.  Creatinine was 1.9 at the time of discharge on 09/17/2021 and was still elevated at 2.0 on 09/19/2021.  Outpatient labs showed improved creatinine down to 1.5.  His most recent labs show a borderline hemoglobin A1c of 5.8% with a fairly satisfactory cholesterol profile (very low HDL but also low total and LDL cholesterol).  He had Covid pneumonia requiring hospitalization in October 2021.  He was hospitalized with hypoxemia due to decompensated congestive heart failure and atrial flutter with rapid ventricular response  in early December 2021.   On transthoracic echocardiogram LV EF was 50-55%, but the right ventricle was mildly dilated and mildly depressed.  TEE showed LVEF 45-50% and a severely enlarged and depressed right ventricle with moderate tricuspid regurgitation. He underwent TEE guided cardioversion successfully on 07/13/2020 and discharged on beta-blockers and  anticoagulants, but returned and was hospitalized again with similar problems on 08/01/2020.  An attempt at cardioversion on 08/02/2020 was not successful. IV Amiodarone was initiated.  Cardioversion was tried again the next day, again unsuccessfully.  The plan was to continue loading with amiodarone and bring back for cardioversion.  He was seen back in clinic in mid January and declined repeat cardioversion.  He was again hospitalized in February for volume overload complicated by cellulitis of the lower extremities, sepsis, atrial fibrillation rapid ventricular response.  He required fluid resuscitation with worsening edema.  The arrhythmia was managed conservatively and his volume status improved after intravenous diuretics.  At hospital discharge his dry weight was estimated to be 270 pounds.  At that weight, his wife reports that his legs were thin and that he no longer had edema or blisters or skin wounds.  Once the patient returned home, he returned to his old habits of adding salt to his food, eating a lot of processed meats and pickles.  He spends the day sitting in a chair at the computer and has developed gradually worsening lower extremity edema.  His weight has gone up 16 pounds and he has developed a new weeping wound on his right leg and blisters on the back of his left calf.  He has been unable to wear compression stockings and does not like to keep his legs elevated.  He also developed a "lobster red" pruritic rash on his neck and shoulders and lower limbs.  He stopped taking amiodarone 4 days before this appointment and the rash is beginning to improve.  amiodarone was stopped and a year later he still has a rash that waxed and waned in severity.  Amiodarone was restarted in April 2023 without any worsening of the rash which continues to wax and wane.  In 2016, years ago he had a right distal femoral and popliteal DVT, partially resolved by follow-up ultrasound in late 2016 but with chronic  occlusion of the right tibioperoneal venous trunk.  About 3 years ago he was having a lot of lower extremity pain related to large varicose veins and underwent venous ablation in his right lower extremity.  He had laser ablation of the left greater saphenous vein and stab phlebectomies of the left leg in November 2016  Starting in March 2020 he had gradually worsening pain and swelling in the left calf, more so than on the right.  In August of this year he developed severe swelling and discomfort in his left calf and was diagnosed with a DVT and started on Xarelto.  Switched to Eliquis later because of suspected allergy (I suspect this was actually only normal skin changes related to brawny edema).     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  He has been living in [place] for the last 10 years or so.  Past Medical History:  Diagnosis Date   Acute diastolic heart failure ([place]) 08/03/2020   Arthritis    Diverticulosis 05/2015   Mild, noted on colonosocpy   Fatty liver  05/14/2015   noted on [place] ABD   History of colon polyps 05/2014   sessile in ascending colon, pedunculated in sigmoid colon, diverticulosis   Hyperlipidemia    pt denies   Hypertension    not currently taking medications per MD   OSA (obstructive sleep apnea) 08/03/2020   Renal cyst 05/2014   Small, Left, noted on [place] ABD   Umbilical hernia    Varicose veins     Past Surgical History:  Procedure Laterality Date   APPENDECTOMY     childhood   BIOPSY  07/11/2020   Procedure: BIOPSY;  Surgeon: Ladene Artist, MD;  Location: [place];  Service: Endoscopy;;   CARDIOVERSION N/A 07/13/2020   Procedure: CARDIOVERSION;  Surgeon: Josue Hector, MD;  Location: [place];  Service: Cardiovascular;  Laterality: N/A;   CARDIOVERSION N/A 08/02/2020   Procedure: CARDIOVERSION;   Surgeon: Werner Lean, MD;  Location: [facility]COPY;  Service: Cardiovascular;  Laterality: N/A;   CARDIOVERSION N/A 12/12/2021   Procedure: CARDIOVERSION;  Surgeon: Fay Records, MD;  Location: [place];  Service: Cardiovascular;  Laterality: N/A;   COLONOSCOPY W/ POLYPECTOMY  05/23/2014   COLONOSCOPY WITH PROPOFOL N/A 07/11/2020   Procedure: COLONOSCOPY WITH PROPOFOL;  Surgeon: Ladene Artist, MD;  Location[facility] ENDOSCOPY;  Service: Endoscopy;  Laterality: N/A;   ESOPHAGOGASTRODUODENOSCOPY (EGD) WITH PROPOFOL N/A 07/11/2020   Procedure: ESOPHAGOGASTRODUODENOSCOPY (EGD) WITH PROPOFOL;  Surgeon: Ladene Artist, MD;  Location: [facility] ENDOSCOPY;  Service: Endoscopy;  Laterality: N/A;   TEE WITHOUT CARDIOVERSION N/A 07/13/2020   Procedure: TRANSESOPHAGEAL ECHOCARDIOGRAM (TEE);  Surgeon: Josue Hector, MD;  Location: [facility] ENDOSCOPY;  Service: Cardiovascular;  Laterality: N/A;   UMBILICAL HERNIA REPAIR N/A 04/27/2018   Procedure: REPAIR UMBILICAL HERNIA ERAS PATHWAY;  Surgeon: Alphonsa Overall, MD;  Location: [facility];  Service: General;  Laterality: N/A;   varicose veins     WRIST SURGERY      Current Medications: Current Meds  Medication Sig   amiodarone (PACERONE) 200 MG tablet Take 1 tablet (200 mg total) by mouth daily.   apixaban (ELIQUIS) 5 MG TABS tablet Take 1 tablet (5 mg total) by mouth 2 (two) times daily. HOLD until follow up with your doctor   bumetanide (BUMEX) 2 MG tablet Take 3 tablets (6 mg total) by mouth 2 (two) times daily.   Magnesium Oxide 400 MG CAPS Take 1 capsule (400 mg total) by mouth daily.   nitroGLYCERIN (NITROSTAT) 0.4 MG SL tablet Place 1 tablet (0.4 mg total) under the tongue every 5 (five) minutes x 3 doses as needed for chest pain.   Potassium Chloride ER 20 MEQ TBCR Take 20 mEq by mouth 2 (two) times daily.   [DISCONTINUED] metoprolol succinate (TOPROL-XL) 50 MG 24 hr tablet Take 1 tablet (50 mg total) by mouth in the morning and at bedtime. Take with or  immediately following a meal.     Allergies:   Diltiazem and Amiodarone   Social History   Socioeconomic History   Marital status: Married    Spouse name: Not on file   Number of children: 0   Years of education: Not on file   Highest education level: Not on file  Occupational History   Not on file  Tobacco Use   Smoking status: Never   Smokeless tobacco: Never  Vaping Use   Vaping Use: Never used  Substance and Sexual Activity   Alcohol use: Yes    Alcohol/week: 0.0 standard drinks    Comment:  occassional wine, martini   Drug use: No   Sexual activity: Not on file  Other Topics Concern   Not on file  Social History Narrative   Not on file   Social Determinants of Health   Financial Resource Strain: Not on file  Food Insecurity: Not on file  Transportation Needs: Not on file  Physical Activity: Not on file  Stress: Not on file  Social Connections: Not on file     Family History: The patient's family history is negative for Colon cancer, Esophageal cancer, Pancreatic cancer, Prostate cancer, Rectal cancer, and Stomach cancer.  ROS:   Please see the history of present illness.    All other systems are reviewed and are negative.   EKGs/Labs/Other Studies Reviewed:    The following studies were reviewed today: Extensive review of notes from his 3 hospitalizations in the last 3 months. Echocardiogram 03/19/2021  Study Result     1. Left ventricular ejection fraction, by estimation, is 55%. The left  ventricle has normal function. The left ventricle has no regional wall  motion abnormalities. There is severe left ventricular hypertrophy. Left  ventricular diastolic parameters are  indeterminate.   2. Right ventricular systolic function is normal. The right ventricular  size is normal.   3. Left atrial size was moderately dilated.   4. The mitral valve is abnormal. Trivial mitral valve regurgitation. No  evidence of mitral stenosis.   5. The aortic valve  is tricuspid. Aortic valve regurgitation is not  visualized. Mild to moderate aortic valve sclerosis/calcification is  present, without any evidence of aortic stenosis.   6. The inferior vena cava is normal in size with greater than 50%  respiratory variability, suggesting right atrial pressure of 3 mmHg.     Transesophageal echocardiogram 07/13/2020   1. TEE with no RAA/LAA thrombus DCC x 1 with 200J converted from atrial  flutter rate 133 bpm to NSR rate 88 bpm On eliquis with no immediate  neurologic sequelae.   2. EF decreased with patient in rapid atrial flutter rate 133. Left  ventricular ejection fraction, by estimation, is 45 to 50%. The left  ventricle has mildly decreased function. There is mild left ventricular  hypertrophy.   3. Right ventricular systolic function is severely reduced. The right  ventricular size is severely enlarged.   4. Left atrial size was moderately dilated. No left atrial/left atrial  appendage thrombus was detected.   5. Right atrial size was severely dilated.   6. The mitral valve is normal in structure. Mild mitral valve  regurgitation.   7. Tricuspid valve regurgitation is moderate.   8. The aortic valve is tricuspid. Aortic valve regurgitation is mild.  Mild aortic valve sclerosis is present, with no evidence of aortic valve  stenosis.   Venous duplex ultrasound 09/25/2021 RIGHT:  - No evidence of common femoral vein obstruction.     LEFT:  - No evidence of deep vein thrombosis in the lower extremity. No indirect evidence of obstruction proximal to the inguinal ligament.  - No cystic structure found in the popliteal fossa.   EKG:    EKG is  ordered today.  It shows sinus rhythm with first-degree AV block (PR 222 ms), right bundle branch block (QRS 130 ms), QTc 470 ms without ischemic changes Recent Labs: 09/13/2021: TSH 2.885 09/17/2021: Magnesium 2.8 12/04/2021: BNP 106.3; BUN 18; Creatinine, Ser 1.38; Hemoglobin 16.0; Platelets 177;  Potassium 3.8; Sodium 139       Component Value Date/Time  CHOL 103 05/08/2020 0335   TRIG 71 05/08/2020 0335   HDL 27 (L) 05/08/2020 0335   CHOLHDL 3.8 05/08/2020 0335   VLDL 14 05/08/2020 0335   LDLCALC 62 05/08/2020 0335    Physical Exam:    VS:  BP 118/72   Pulse 73   Ht '5\' 7"'$  (1.702 m)   Wt (!) 333 lb (151 kg)   SpO2 (!) 82%   BMI 52.16 kg/m     Wt Readings from Last 3 Encounters:  12/19/21 (!) 333 lb (151 kg)  12/12/21 (!) 316 lb 9.3 oz (143.6 kg)  12/04/21 (!) 316 lb 8 oz (143.6 kg)      General: Alert, oriented x3, no distress, morbidly obese Head: no evidence of trauma, PERRL, EOMI, no exophtalmos or lid lag, no myxedema, no xanthelasma; normal ears, nose and oropharynx Neck: normal jugular venous pulsations and no hepatojugular reflux; brisk carotid pulses without delay and no carotid bruits Chest: clear to auscultation, no signs of consolidation by percussion or palpation, normal fremitus, symmetrical and full respiratory excursions Cardiovascular: normal position and quality of the apical impulse, regular rhythm, normal first and widely split second heart sounds, no murmurs, rubs or gallops Abdomen: no tenderness or distention, no masses by palpation, no abnormal pulsatility or arterial bruits, normal bowel sounds, no hepatosplenomegaly Extremities: 3+ hard pitting edema with cyanotic changes from the feet to the knees bilaterally Neurological: grossly nonfocal Psych: Normal mood and affect    ASSESSMENT:    1. Typical atrial flutter ([place])   2. Chronic diastolic HF (heart failure) ([place])   3. Encounter for monitoring amiodarone therapy   4. Venous insufficiency of both lower extremities   5. Postphlebitic syndrome   6. Morbid obesity ([place])   7. Prediabetes   8. OSA (obstructive sleep apnea)   9. Pruritic erythematous rash          PLAN:    In order of problems listed above:  Atrial flutter: Probably due to right atrial enlargement in the  setting of cor pulmonale from untreated sleep apnea.  He did not tolerate loading dose of amiodarone but is taking 200 mg a day without side effects.  His rash has not changed despite restarting amiodarone.  He is compliant with Eliquis.  He has profound fatigue in the mornings when the medicine is taking.  We will decrease the dose of metoprolol and change the amiodarone administration to the evenings.  Reviewed the option for ablation, but he remains skeptical. CHF: Remains markedly hypervolemic and has findings consistent with chronic cor pulmonale due to OSA and probably obesity hypoventilation syndrome.  BNP has been barely above the upper limit of normal.  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  Increase Bumex to 6 mg twice daily. Amiodarone: It appears this is helping to maintain him in normal rhythm.  We had stopped it due to a rash that did not resolve after being off amiodarone for a year and the rash has not worsened now that we have restarted.  He had normal thyroid and liver function tests a few weeks ago.  Repeat these every 6 months while on amiodarone. DVT: Has evidence of postphlebitic syndrome which further complicates the swelling related to right heart failure.  Plan lifelong anticoagulation. Obesity: Not compliant with recommendations for caloric restriction and physical activity.  Extremely deconditioned.  At risk for falls.  He asked for a prescription for an electrical folding  wheelchair and I gave him 1.  If he needs an electrical scooter will have to go through PCP. Pre-DM: As always has glucose levels in the borderline prediabetes range and good LDL cholesterol, but has a chronic severely depressed HDL level. OSA: Suspect he also has obesity hypoventilation syndrome.  He tolerates low oxygen saturation without any subjective dyspnea.  This is most serious illness and is the cause for his heart failure  and arrhythmia. Rash: Retrospectively this is unlikely to be amiodarone related.  Etiology remains uncertain.    Medication Adjustments/Labs and Tests Ordered: Current medicines are reviewed at length with the patient today.  Concerns regarding medicines are outlined above.  Orders Placed This Encounter  Procedures   EKG 12-Lead       Meds ordered this encounter  Medications   metoprolol succinate (TOPROL-XL) 50 MG 24 hr tablet    Sig: Take 1 tablet (50 mg total) by mouth at bedtime. Take with or immediately following a meal.    Dispense:  30 tablet    Refill:  1        Patient Instructions  Medication Instructions:  Take Amiodarone at night  DECREASE the Metoprolol Succinate to 50 mg once daily at night  START the Bumex 6 mg (3 tablets) twice daily  *If you need a refill on your cardiac medications before your next appointment, please call your pharmacy*   Lab Work: None ordered If you have labs (blood work) drawn today and your tests are completely normal, you will receive your results only by: [place] (if you have MyChart) OR A paper copy in the mail If you have any lab test that is abnormal or we need to change your treatment, we will call you to review the results.   Testing/Procedures: None ordered   Follow-Up: At [facility], you and your health needs are our priority.  As part of our continuing mission to provide you with exceptional heart care, we have created designated Provider Care Teams.  These Care Teams include your primary Cardiologist (physician) and Advanced Practice Providers (APPs -  Physician Assistants and Nurse Practitioners) who all work together to provide you with the care you need, when you need it.  We recommend signing up for the patient portal called "MyChart".  Sign up information is provided on this After Visit Summary.  MyChart is used to connect with patients for Virtual Visits (Telemedicine).  Patients are able to view  lab/test results, encounter notes, upcoming appointments, etc.  Non-urgent messages can be sent to your provider as well.   To learn more about what you can do with MyChart, go to [email or web address].    Your next appointment:   6 week(s)  The format for your next appointment:   In Person  Provider:   Sanda Klein, MD {   Important Information About Sugar         Signed, Sanda Klein, MD  12/23/2021 2:00 PM    [place]

## 2022-01-31 ENCOUNTER — Encounter: Payer: Self-pay | Admitting: Cardiovascular Disease

## 2022-01-31 ENCOUNTER — Ambulatory Visit (INDEPENDENT_AMBULATORY_CARE_PROVIDER_SITE_OTHER): Payer: Medicare Other | Admitting: Cardiovascular Disease

## 2022-01-31 VITALS — BP 100/52 | HR 73 | Ht 67.0 in | Wt 320.8 lb

## 2022-01-31 DIAGNOSIS — R21 Rash and other nonspecific skin eruption: Secondary | ICD-10-CM

## 2022-01-31 DIAGNOSIS — G4733 Obstructive sleep apnea (adult) (pediatric): Secondary | ICD-10-CM

## 2022-01-31 DIAGNOSIS — E662 Morbid (severe) obesity with alveolar hypoventilation: Secondary | ICD-10-CM

## 2022-01-31 DIAGNOSIS — I5032 Chronic diastolic (congestive) heart failure: Secondary | ICD-10-CM

## 2022-01-31 DIAGNOSIS — I483 Typical atrial flutter: Secondary | ICD-10-CM

## 2022-01-31 DIAGNOSIS — I82541 Chronic embolism and thrombosis of right tibial vein: Secondary | ICD-10-CM

## 2022-01-31 DIAGNOSIS — D6869 Other thrombophilia: Secondary | ICD-10-CM | POA: Diagnosis not present

## 2022-01-31 DIAGNOSIS — Z79899 Other long term (current) drug therapy: Secondary | ICD-10-CM

## 2022-01-31 DIAGNOSIS — Z5181 Encounter for therapeutic drug level monitoring: Secondary | ICD-10-CM

## 2022-01-31 DIAGNOSIS — R7303 Prediabetes: Secondary | ICD-10-CM

## 2022-01-31 MED ORDER — BUMETANIDE 2 MG PO TABS: 8.0000 mg | ORAL_TABLET | Freq: Two times a day (BID) | ORAL | 1 refills | Status: DC

## 2022-01-31 NOTE — Patient Instructions (Signed)
Medication Instructions:  INCREASE the Bumex to 8 mg twice daily (4 tablets twice daily)  *If you need a refill on your cardiac medications before your next appointment, please call your pharmacy*   Lab Work: Your provider would like for you to have the following labs today: TSH, CMET, and [place]  If you have labs (blood work) drawn today and your tests are completely normal, you will receive your results only by: [place] (if you have MyChart) OR A paper copy in the mail If you have any lab test that is abnormal or we need to change your treatment, we will call you to review the results.   Testing/Procedures: None ordered   Follow-Up: At [facility], you and your health needs are our priority.  As part of our continuing mission to provide you with exceptional heart care, we have created designated Provider Care Teams.  These Care Teams include your primary Cardiologist (physician) and Advanced Practice Providers (APPs -  Physician Assistants and Nurse Practitioners) who all work together to provide you with the care you need, when you need it.  We recommend signing up for the patient portal called "MyChart".  Sign up information is provided on this After Visit Summary.  MyChart is used to connect with patients for Virtual Visits (Telemedicine).  Patients are able to view lab/test results, encounter notes, upcoming appointments, etc.  Non-urgent messages can be sent to your provider as well.   To learn more about what you can do with MyChart, go to [email or web address].    Your next appointment:   Keep your follow up on 8/11 at 10 am

## 2022-01-31 NOTE — Progress Notes (Signed)
Cardiology new patient note:    Date:  01/31/2022   ID:  Shane Diaz, DOB 08/27/1945, MRN [id number]  PCP:  [name], MD  Cardiologist:  Sanda Klein, MD New Electrophysiologist:  None   Referring MD: [name], MD   Chief Complaint  Patient presents with   Congestive Heart Failure    History of Present Illness:    Shane Diaz is a 76 y.o. 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  He came back to the office on 11/21/2021 due to rapid heart rates and had atrial flutter with 2: 1 AV block and a ventricular rate of 152 bpm.  Amiodarone was started.  (This had been stopped more than a year ago when he developed a rash.  The rash never disappeared despite stopping the medication).  2 weeks after starting amiodarone he underwent elective electrical cardioversion, successfully on 12/12/2021.  He has largely maintained normal rhythm since, although he has occasional brief palpitations.    We have had to use very large doses of loop diuretics to help with his lower extremity edema which was complicated by blisters, active weeping and cellulitis.  He wears oxygen at night, but not during the daytime.  We will try to schedule him for sleep studies repeatedly, for the large part he has refused and even when these were scheduled he did not follow  through.  He does not think he will ever wear CPAP.  He's lost about 15 pounds since his last appointment.  The edema has largely retreated from his thighs, but is still severe in his calves.  His skin ulcerations have healed almost completely except for 2 pinpoint scars/scabs on his left anterior shin.  Edema is actually consistently worse in the left lower extremity where he had previous ablation.  He has not had any bleeding problems with Eliquis.  He has not had any falls in the last couple of weeks.  He is mostly limited to walking from the bedroom to the bathroom and spends his day in a chair.  He continues to feel "washed out and sleepy" within an hour or 2 of taking his morning medicines.  I had recommended that he take the metoprolol in the evening, but they have not yet made that change.  He does not have orthopnea, PND, cough, hemoptysis or chest pain.  At his last several office visits his oxygen saturation has been 88% at rest.  He would like a portable oxygen device.   He had an estimated dry weight of 260 pounds when he was hospitalized in 2021, has had limited success with outpatient oral diuretics.  During the most recent hospitalization he was treated with intravenous diuretics, but this had to be discontinued when he developed acute kidney injury.  Creatinine was 1.9 at the time of discharge on 09/17/2021 and was still elevated at 2.0 on 09/19/2021.  Outpatient labs showed improved creatinine down to 1.5.  He had Covid pneumonia requiring hospitalization  in October 2021.  He was hospitalized with hypoxemia due to decompensated congestive heart failure and atrial flutter with rapid ventricular response in early December 2021.   On transthoracic echocardiogram LV EF was 50-55%, but the right ventricle was mildly dilated and mildly depressed.  TEE showed LVEF 45-50% and a severely enlarged and depressed right ventricle with moderate tricuspid regurgitation. He underwent TEE guided  cardioversion successfully on 07/13/2020 and discharged on beta-blockers and anticoagulants, but returned and was hospitalized again with similar problems on 08/01/2020.  An attempt at cardioversion on 08/02/2020 was not successful. IV Amiodarone was initiated.  Cardioversion was tried again the next day, again unsuccessfully.  The plan was to continue loading with amiodarone and bring back for cardioversion.  He was seen back in clinic in mid January and declined repeat cardioversion.  He was again hospitalized in February for volume overload complicated by cellulitis of the lower extremities, sepsis, atrial fibrillation rapid ventricular response.  He required fluid resuscitation with worsening edema.  The arrhythmia was managed conservatively and his volume status improved after intravenous diuretics.  At hospital discharge his dry weight was estimated to be 270 pounds.  At that weight, his wife reports that his legs were thin and that he no longer had edema or blisters or skin wounds.  Once the patient returned home, he returned to his old habits of adding salt to his food, eating a lot of processed meats and pickles.  He spends the day sitting in a chair at the computer and has developed gradually worsening lower extremity edema.  His weight has gone up 16 pounds and he has developed a new weeping wound on his right leg and blisters on the back of his left calf.  He has been unable to wear compression stockings and does not like to keep his legs elevated.  He also developed a "lobster red" pruritic rash on his neck and shoulders and lower limbs.  He stopped taking amiodarone 4 days before this appointment and the rash is beginning to improve.  amiodarone was stopped and a year later he still has a rash that waxed and waned in severity.  Amiodarone was restarted in April 2023 without any worsening of the rash which continues to wax and wane.  In 2016, years ago he had a right distal femoral and popliteal  DVT, partially resolved by follow-up ultrasound in late 2016 but with chronic occlusion of the right tibioperoneal venous trunk.  About 3 years ago he was having a lot of lower extremity pain related to large varicose veins and underwent venous ablation in his right lower extremity.  He had laser ablation of the left greater saphenous vein and stab phlebectomies of the left leg in November 2016  Starting in March 2020 he had gradually worsening pain and swelling in the left calf, more so than on the right.  In August of this year he developed severe swelling and discomfort in his left calf and was diagnosed with a DVT and started on Xarelto.  Switched to Eliquis later because of suspected allergy (I suspect this was actually only normal skin changes related to brawny edema).     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  He has been living in [place] for the last 10 years or so.  Past Medical History:  Diagnosis Date   Acute diastolic  heart failure ([place]) 08/03/2020   Arthritis    Diverticulosis 05/2015   Mild, noted on colonosocpy   Fatty liver 05/14/2015   noted on [place] ABD   History of colon polyps 05/2014   sessile in ascending colon, pedunculated in sigmoid colon, diverticulosis   Hyperlipidemia    pt denies   Hypertension    not currently taking medications per MD   OSA (obstructive sleep apnea) 08/03/2020   Renal cyst 05/2014   Small, Left, noted on [place] ABD   Umbilical hernia    Varicose veins     Past Surgical History:  Procedure Laterality Date   APPENDECTOMY     childhood   BIOPSY  07/11/2020   Procedure: BIOPSY;  Surgeon: Ladene Artist, MD;  Location: [facility];  Service: Endoscopy;;   CARDIOVERSION N/A 07/13/2020   Procedure: CARDIOVERSION;  Surgeon: Josue Hector, MD;  Location: [facility];  Service: Cardiovascular;   Laterality: N/A;   CARDIOVERSION N/A 08/02/2020   Procedure: CARDIOVERSION;  Surgeon: Werner Lean, MD;  Location: [facility];  Service: Cardiovascular;  Laterality: N/A;   CARDIOVERSION N/A 12/12/2021   Procedure: CARDIOVERSION;  Surgeon: Fay Records, MD;  Location: [facility] [facility];  Service: Cardiovascular;  Laterality: N/A;   COLONOSCOPY W/ POLYPECTOMY  05/23/2014   COLONOSCOPY WITH PROPOFOL N/A 07/11/2020   Procedure: COLONOSCOPY WITH PROPOFOL;  Surgeon: Ladene Artist, MD;  Location: [facility] END[facility];  Service: Endoscopy;  Laterality: N/A;   ESOPHAGOGASTRODUODENOSCOPY (EGD) WITH PROPOFOL N/A 07/11/2020   Procedure: ESOPHAGOGASTRODUODENOSCOPY (EGD) WITH PROPOFOL;  Surgeon: Ladene Artist, MD;  Location[facility] ENDOSCOPY;  Service: Endoscopy;  Laterality: N/A;   TEE WITHOUT CARDIOVERSION N/A 07/13/2020   Procedure: TRANSESOPHAGEAL ECHOCARDIOGRAM (TEE);  Surgeon: Josue Hector, MD;  Location: [facility] ENDOSCOPY;  Service: Cardiovascular;  Laterality: N/A;   UMBILICAL HERNIA REPAIR N/A 04/27/2018   Procedure: REPAIR UMBILICAL HERNIA ERAS PATHWAY;  Surgeon: Alphonsa Overall, MD;  Location: [facility];  Service: General;  Laterality: N/A;   varicose veins     WRIST SURGERY      Current Medications: Current Meds  Medication Sig   amiodarone (PACERONE) 200 MG tablet Take 1 tablet (200 mg total) by mouth daily.   apixaban (ELIQUIS) 5 MG TABS tablet Take 1 tablet (5 mg total) by mouth 2 (two) times daily. HOLD until follow up with your doctor   Magnesium Oxide 400 MG CAPS Take 1 capsule (400 mg total) by mouth daily.   metoprolol succinate (TOPROL-XL) 50 MG 24 hr tablet Take 1 tablet (50 mg total) by mouth at bedtime. Take with or immediately following a meal.   nitroGLYCERIN (NITROSTAT) 0.4 MG SL tablet Place 1 tablet (0.4 mg total) under the tongue every 5 (five) minutes x 3 doses as needed for chest pain.   Potassium Chloride ER 20 MEQ TBCR Take 20 mEq by mouth 2 (two) times daily.   [DISCONTINUED]  bumetanide (BUMEX) 2 MG tablet Take 3 tablets (6 mg total) by mouth 2 (two) times daily.     Allergies:   Diltiazem and Amiodarone   Social History   Socioeconomic History   Marital status: Married    Spouse name: Not on file   Number of children: 0   Years of education: Not on file   Highest education level: Not on file  Occupational History   Not on file  Tobacco Use   Smoking status: Never   Smokeless tobacco: Never  Vaping Use   Vaping Use: Never used  Substance and  Sexual Activity   Alcohol use: Yes    Alcohol/week: 0.0 standard drinks of alcohol    Comment: occassional wine, martini   Drug use: No   Sexual activity: Not on file  Other Topics Concern   Not on file  Social History Narrative   Not on file   Social Determinants of Health   Financial Resource Strain: Not on file  Food Insecurity: Not on file  Transportation Needs: Not on file  Physical Activity: Not on file  Stress: Not on file  Social Connections: Not on file     Family History: The patient's family history is negative for Colon cancer, Esophageal cancer, Pancreatic cancer, Prostate cancer, Rectal cancer, and Stomach cancer.  ROS:   Please see the history of present illness.    All other systems are reviewed and are negative.   EKGs/Labs/Other Studies Reviewed:    The following studies were reviewed today: Extensive review of notes from his 3 hospitalizations in the last 3 months. Echocardiogram 03/19/2021  Study Result     1. Left ventricular ejection fraction, by estimation, is 55%. The left  ventricle has normal function. The left ventricle has no regional wall  motion abnormalities. There is severe left ventricular hypertrophy. Left  ventricular diastolic parameters are  indeterminate.   2. Right ventricular systolic function is normal. The right ventricular  size is normal.   3. Left atrial size was moderately dilated.   4. The mitral valve is abnormal. Trivial mitral valve  regurgitation. No  evidence of mitral stenosis.   5. The aortic valve is tricuspid. Aortic valve regurgitation is not  visualized. Mild to moderate aortic valve sclerosis/calcification is  present, without any evidence of aortic stenosis.   6. The inferior vena cava is normal in size with greater than 50%  respiratory variability, suggesting right atrial pressure of 3 mmHg.     Transesophageal echocardiogram 07/13/2020   1. TEE with no RAA/LAA thrombus DCC x 1 with 200J converted from atrial  flutter rate 133 bpm to NSR rate 88 bpm On eliquis with no immediate  neurologic sequelae.   2. EF decreased with patient in rapid atrial flutter rate 133. Left  ventricular ejection fraction, by estimation, is 45 to 50%. The left  ventricle has mildly decreased function. There is mild left ventricular  hypertrophy.   3. Right ventricular systolic function is severely reduced. The right  ventricular size is severely enlarged.   4. Left atrial size was moderately dilated. No left atrial/left atrial  appendage thrombus was detected.   5. Right atrial size was severely dilated.   6. The mitral valve is normal in structure. Mild mitral valve  regurgitation.   7. Tricuspid valve regurgitation is moderate.   8. The aortic valve is tricuspid. Aortic valve regurgitation is mild.  Mild aortic valve sclerosis is present, with no evidence of aortic valve  stenosis.   Venous duplex ultrasound 09/25/2021 RIGHT:  - No evidence of common femoral vein obstruction.     LEFT:  - No evidence of deep vein thrombosis in the lower extremity. No indirect evidence of obstruction proximal to the inguinal ligament.  - No cystic structure found in the popliteal fossa.   EKG:    EKG is  ordered today.  It shows sinus rhythm with first-degree AV block (PR 222 ms), right bundle branch block (QRS 130 ms), QTc 470 ms without ischemic changes Recent Labs: 09/13/2021: TSH 2.885 09/17/2021: Magnesium 2.8 12/04/2021: BNP  106.3; BUN 18; Creatinine, Ser  1.38; Hemoglobin 16.0; Platelets 177; Potassium 3.8; Sodium 139       Component Value Date/Time   CHOL 103 05/08/2020 0335   TRIG 71 05/08/2020 0335   HDL 27 (L) 05/08/2020 0335   CHOLHDL 3.8 05/08/2020 0335   VLDL 14 05/08/2020 0335   LDLCALC 62 05/08/2020 0335    Physical Exam:    VS:  BP (!) 100/52   Pulse 73   Ht '5\' 7"'$  (1.702 m)   Wt (!) 320 lb 12.8 oz (145.5 kg)   SpO2 (!) 88%   BMI 50.24 kg/m     Wt Readings from Last 3 Encounters:  01/31/22 (!) 320 lb 12.8 oz (145.5 kg)  12/19/21 (!) 333 lb (151 kg)  12/12/21 (!) 316 lb 9.3 oz (143.6 kg)      General: Alert, oriented x3, no distress, morbidly obese Head: no evidence of trauma, PERRL, EOMI, no exophtalmos or lid lag, no myxedema, no xanthelasma; normal ears, nose and oropharynx Neck: normal jugular venous pulsations and no hepatojugular reflux; brisk carotid pulses without delay and no carotid bruits Chest: clear to auscultation, no signs of consolidation by percussion or palpation, normal fremitus, symmetrical and full respiratory excursions Cardiovascular: normal position and quality of the apical impulse, regular rhythm, normal first and second heart sounds, no murmurs, rubs or gallops Abdomen: no tenderness or distention, no masses by palpation, no abnormal pulsatility or arterial bruits, normal bowel sounds, no hepatosplenomegaly Extremities: He has bilateral cyanosis of the calves and feet and 3+ hard pitting edema all the way to just above the knees bilaterally.  A couple of pinpoint scabs in the right anterior shin but no active blisters or weeping wounds.  No evidence of cellulitis at this time. Neurological: grossly nonfocal Psych: Normal mood and affect   ASSESSMENT:    1. Typical atrial flutter ([place])   2. Encounter for monitoring amiodarone therapy   3. Chronic diastolic HF (heart failure) ([place])   4. Acquired thrombophilia ([place])   5. 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 Morbid obesity ([place])   7. Prediabetes   8. OSA (obstructive sleep apnea)   9. Obesity hypoventilation syndrome ([place])   10. Rash           PLAN:    In order of problems listed above:  Atrial flutter: Following his cardioversion and treatment with amiodarone there has been no significant recurrence.  Probably due to right atrial enlargement in the setting of cor pulmonale from untreated sleep apnea.  Recommend switching the metoprolol dose to evening time.  Continue amiodarone (the rash in fact has improved, despite restarting amiodarone). CHF: Clearly severely hypervolemic, but only with findings of right heart failure, no left heart failure.  He has chronic cor pulmonale due to OSA and probably obesity hypoventilation syndrome.  BNP has been barely above the upper limit of normal.  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  Increase Bumex to 8 mg twice daily.  Previously his dry weight was around 260 lb (I doubt we will be able to reach this target without intravenous diuretics and without worsening renal function). Amiodarone: This has clearly helped with rhythm maintenance and overall heart failure management.  We have reviewed the potential side effects.  We will recheck liver function tests and thyroid function tests today as well as his electrolytes. 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  Plan lifelong anticoagulation. Obesity: He is eating less due to poor appetite (may be due to right heart failure or amiodarone).  Extremely deconditioned.  At risk for falls.  He asked for a prescription for an electrical folding wheelchair and I gave him 1.  If he needs an electrical  scooter will have to go through PCP. Pre-DM:  has glucose levels in the borderline prediabetes range and good LDL cholesterol, but has a chronic severely depressed HDL level. OSA/probable obesity hypoventilation syndrome:   He tolerates low oxygen saturation without any subjective dyspnea.  This is most serious illness and is the cause for his heart failure and arrhythmia.  At his next appointment we need to check oxygen saturation with a walking test and oxygen supplementation to see if he qualifies for portable oxygen device (I strongly believe he would benefit from this). Rash: Retrospectively this is unlikely to be amiodarone related.  Etiology remains uncertain.    Medication Adjustments/Labs and Tests Ordered: Current medicines are reviewed at length with the patient today.  Concerns regarding medicines are outlined above.  Orders Placed This Encounter  Procedures   TSH   Magnesium   Comprehensive metabolic panel       Meds ordered this encounter  Medications   bumetanide (BUMEX) 2 MG tablet    Sig: Take 4 tablets (8 mg total) by mouth 2 (two) times daily.    Dispense:  240 tablet    Refill:  1        Patient Instructions  Medication Instructions:  INCREASE the Bumex to 8 mg twice daily (4 tablets twice daily)  *If you need a refill on your cardiac medications before your next appointment, please call your pharmacy*   Lab Work: Your provider would like for you to have the following labs today: TSH, CMET, and Mag[place]ium  If you have labs (blood work) drawn today and your tests are completely normal, you will receive your results only by: [place] (if you have MyChart) OR A paper copy in the mail If you have any lab test that is abnormal or we need to change your treatment, we will call you to review the results.   Testing/Procedures: None ordered   Follow-Up: At [facility], you and your health needs are our priority.  As part of our continuing  mission to provide you with exceptional heart care, we have created designated Provider Care Teams.  These Care Teams include your primary Cardiologist (physician) and Advanced Practice Providers (APPs -  Physician Assistants and Nurse Practitioners) who all work together to provide you with the care you need, when you need it.  We recommend signing up for the patient portal called "MyChart".  Sign up information is provided on this After Visit Summary.  MyChart is used to connect with patients for Virtual Visits (Telemedicine).  Patients are able to view lab/test results, encounter notes, upcoming appointments, etc.  Non-urgent messages can be sent to your provider as well.   To learn more about what you can do with MyChart, go to [email or web address].    Your next appointment:   Keep your follow up on 8/11 at 10 am      Signed, Sanda Klein, MD  01/31/2022 11:33 AM    [place]

## 2022-02-01 LAB — COMPREHENSIVE METABOLIC PANEL
ALT: 17 IU/L (ref 0–44)
AST: 25 IU/L (ref 0–40)
Albumin/Globulin Ratio: 1.3 (ref 1.2–2.2)
Albumin: 4.2 g/dL (ref 3.7–4.7)
Alkaline Phosphatase: 111 IU/L (ref 44–121)
BUN/Creatinine Ratio: 15 (ref 10–24)
BUN: 20 mg/dL (ref 8–27)
Bilirubin Total: 0.7 mg/dL (ref 0.0–1.2)
CO2: 39 mmol/L — ABNORMAL HIGH (ref 20–29)
Calcium: 9.8 mg/dL (ref 8.6–10.2)
Chloride: 89 mmol/L — ABNORMAL LOW (ref 96–106)
Creatinine, Ser: 1.36 mg/dL — ABNORMAL HIGH (ref 0.76–1.27)
Globulin, Total: 3.3 g/dL (ref 1.5–4.5)
Glucose: 111 mg/dL — ABNORMAL HIGH (ref 70–99)
Potassium: 4.3 mmol/L (ref 3.5–5.2)
Sodium: 141 mmol/L (ref 134–144)
Total Protein: 7.5 g/dL (ref 6.0–8.5)
eGFR: 54 mL/min/{1.73_m2} — ABNORMAL LOW (ref >59)

## 2022-02-01 LAB — MAGNESIUM: Magnesium: 2.2 mg/dL (ref 1.6–2.3)

## 2022-02-01 LAB — TSH: TSH: 5.58 u[IU]/mL — ABNORMAL HIGH (ref 0.450–4.500)

## 2022-02-03 ENCOUNTER — Other Ambulatory Visit: Payer: Self-pay | Admitting: *Deleted

## 2022-02-03 DIAGNOSIS — Z79899 Other long term (current) drug therapy: Secondary | ICD-10-CM

## 2022-02-03 DIAGNOSIS — I4819 Other persistent atrial fibrillation: Secondary | ICD-10-CM

## 2022-02-11 ENCOUNTER — Other Ambulatory Visit: Payer: Self-pay | Admitting: *Deleted

## 2022-02-11 ENCOUNTER — Encounter: Payer: Self-pay | Admitting: *Deleted

## 2022-02-17 ENCOUNTER — Other Ambulatory Visit: Payer: Self-pay | Admitting: Cardiovascular Disease

## 2022-03-14 ENCOUNTER — Encounter: Payer: Self-pay | Admitting: Cardiovascular Disease

## 2022-03-14 ENCOUNTER — Ambulatory Visit (INDEPENDENT_AMBULATORY_CARE_PROVIDER_SITE_OTHER): Payer: Medicare Other | Admitting: Cardiovascular Disease

## 2022-03-14 VITALS — BP 112/64 | HR 75 | Ht 67.0 in | Wt 323.6 lb

## 2022-03-14 DIAGNOSIS — D6869 Other thrombophilia: Secondary | ICD-10-CM | POA: Diagnosis not present

## 2022-03-14 DIAGNOSIS — I483 Typical atrial flutter: Secondary | ICD-10-CM

## 2022-03-14 DIAGNOSIS — Z5181 Encounter for therapeutic drug level monitoring: Secondary | ICD-10-CM | POA: Diagnosis not present

## 2022-03-14 DIAGNOSIS — Z79899 Other long term (current) drug therapy: Secondary | ICD-10-CM

## 2022-03-14 DIAGNOSIS — G4733 Obstructive sleep apnea (adult) (pediatric): Secondary | ICD-10-CM

## 2022-03-14 DIAGNOSIS — I5032 Chronic diastolic (congestive) heart failure: Secondary | ICD-10-CM

## 2022-03-14 DIAGNOSIS — E662 Morbid (severe) obesity with alveolar hypoventilation: Secondary | ICD-10-CM

## 2022-03-14 DIAGNOSIS — R7303 Prediabetes: Secondary | ICD-10-CM

## 2022-03-14 DIAGNOSIS — I87009 Postthrombotic syndrome without complications of unspecified extremity: Secondary | ICD-10-CM

## 2022-03-14 MED ORDER — BUMETANIDE 2 MG PO TABS: 4.0000 mg | ORAL_TABLET | Freq: Two times a day (BID) | ORAL | 2 refills | Status: DC

## 2022-03-14 NOTE — Progress Notes (Signed)
Cardiology new patient note:    Date:  03/14/2022   ID:  Shane Diaz, DOB [birth year], MRN [id number]  PCP:  [name], MD  Cardiologist:  Sanda Klein, MD New Electrophysiologist:  None   Referring MD: [name], MD   No chief complaint on file.   History of Present Illness:    Shane Diaz is a 76 y.o. 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  Complicated by chronic cor pulmonale and right heart failure.  He came back to the office on 11/21/2021 due to rapid heart rates and had atrial flutter with 2: 1 AV block and a ventricular rate of 152 bpm.  Amiodarone was started.  (This had been stopped more than a year ago when he developed a rash.  The rash never disappeared despite stopping the medication).  2 weeks after starting amiodarone he underwent elective electrical cardioversion, successfully on 12/12/2021.  He has l normal rhythm since then.  The rash has not been any worse since starting amiodarone.  Continues to be severely hypervolemic.  Actually increase his bumetanide to 8 mg twice daily after his last appointment, but he only did this for a couple of days and then cut the dose back because he felt his kidneys were uncomfortable with a squeezing sensation.  He promptly gained fluid back and had weeping wounds from both lower extremities.  He has now started  taking bumetanide 2 mg twice a day and the weeping wounds are healed and have dry scabs (posterior left calf, pretibial left, lateral right calf).  He still has severe edema, up to the thighs and is back over 320 pounds.  There is no evidence of excessive erythema, tenderness or circumscribed swelling that would suggest cellulitis.  He is more compliant with sodium restriction and his appetite is poor.  We have had to use very large doses of loop diuretics to help with his lower extremity edema which has been complicated by blisters, active weeping and cellulitis.  He wears oxygen at night, but not during the daytime.  We have tried to schedule him for sleep studies repeatedly, for the large part he has refused and even when these were scheduled he did not follow through.  He does not think he will ever wear CPAP.  He denies angina, orthopnea, PND.  He has not had palpitations, dizziness or syncope but his legs are very weak and wobbly.  He has a collapsible wheelchair that he sometimes uses around the house.  He denies cough or hemoptysis.  He has not had falls or bleeding.   He had an estimated dry weight of 260 pounds when he was hospitalized in 2021, has had limited success with outpatient oral diuretics.  During the most recent hospitalization he was treated with intravenous diuretics, but this had to be discontinued when he developed acute kidney injury.  Creatinine was 1.9 at the time of discharge on 09/17/2021 and was still elevated at 2.0 on 09/19/2021.  Outpatient labs showed improved  creatinine down to 1.5.  He had Covid pneumonia requiring hospitalization in October 2021.  He was hospitalized with hypoxemia due to decompensated congestive heart failure and atrial flutter with rapid ventricular response in early December 2021.   On transthoracic echocardiogram LV EF was 50-55%, but the right ventricle was mildly dilated and mildly depressed.  TEE showed LVEF 45-50% and a severely enlarged and  depressed right ventricle with moderate tricuspid regurgitation. He underwent TEE guided cardioversion successfully on 07/13/2020 and discharged on beta-blockers and anticoagulants, but returned and was hospitalized again with similar problems on 08/01/2020.  An attempt at cardioversion on 08/02/2020 was not successful. IV Amiodarone was initiated.  Cardioversion was tried again the next day, again unsuccessfully.  The plan was to continue loading with amiodarone and bring back for cardioversion.  He was seen back in clinic in mid January and declined repeat cardioversion.  He was again hospitalized in February for volume overload complicated by cellulitis of the lower extremities, sepsis, atrial fibrillation rapid ventricular response.  He required fluid resuscitation with worsening edema.  The arrhythmia was managed conservatively and his volume status improved after intravenous diuretics.  At hospital discharge his dry weight was estimated to be 270 pounds.  At that weight, his wife reports that his legs were thin and that he no longer had edema or blisters or skin wounds.  Once the patient returned home, he returned to his old habits of adding salt to his food, eating a lot of processed meats and pickles.  He spends the day sitting in a chair at the computer and has developed gradually worsening lower extremity edema.  His weight has gone up 16 pounds and he has developed a new weeping wound on his right leg and blisters on the back of his left calf.  He has been unable to wear compression stockings and does not like to keep his legs elevated.  He also developed a "lobster red" pruritic rash on his neck and shoulders and lower limbs.  He stopped taking amiodarone 4 days before this appointment and the rash is beginning to improve.  amiodarone was stopped and a year later he still has a rash that waxed and waned in severity.  Amiodarone was restarted in April 2023 without any worsening of the rash which  continues to wax and wane.  In 2016, years ago he had a right distal femoral and popliteal DVT, partially resolved by follow-up ultrasound in late 2016 but with chronic occlusion of the right tibioperoneal venous trunk.  About 3 years ago he was having a lot of lower extremity pain related to large varicose veins and underwent venous ablation in his right lower extremity.  He had laser ablation of the left greater saphenous vein and stab phlebectomies of the left leg in November 2016  Starting in March 2020 he had gradually worsening pain and swelling in the left calf, more so than on the right.  In August of this year he developed severe swelling and discomfort in his left calf and was diagnosed with a DVT and started on Xarelto.  Switched to Eliquis later because of suspected allergy (I suspect this was actually only normal skin changes related to brawny edema).     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  He has been living in [place] for the last 10 years or so.  Past Medical History:  Diagnosis Date   Acute diastolic heart failure ([place]) 08/03/2020   Arthritis    Diverticulosis 05/2015   Mild, noted on colonosocpy   Fatty liver 05/14/2015   noted on [place] ABD   History of colon polyps 05/2014   sessile in ascending colon, pedunculated in sigmoid colon, diverticulosis   Hyperlipidemia    pt denies   Hypertension    not currently taking medications per MD   OSA (obstructive sleep apnea) 08/03/2020   Renal cyst 05/2014   Small, Left, noted on [place] ABD   Umbilical hernia    Varicose veins     Past Surgical History:  Procedure Laterality Date   APPENDECTOMY     childhood   BIOPSY  07/11/2020   Procedure: BIOPSY;  Surgeon: Ladene Artist, MD;  Location: [place];  Service: Endoscopy;;   CARDIOVERSION N/A 07/13/2020   Procedure:  CARDIOVERSION;  Surgeon: Josue Hector, MD;  Location: [facility];  Service: Cardiovascular;  Laterality: N/A;   CARDIOVERSION N/A 08/02/2020   Procedure: CARDIOVERSION;  Surgeon: Werner Lean, MD;  Location: [facility] ENDOSCOPY;  Service: Cardiovascular;  Laterality: N/A;   CARDIOVERSION N/A 12/12/2021   Procedure: CARDIOVERSION;  Surgeon: Fay Records, MD;  Location: [facility];  Service: Cardiovascular;  Laterality: N/A;   COLONOSCOPY W/ POLYPECTOMY  05/23/2014   COLONOSCOPY WITH PROPOFOL N/A 07/11/2020   Procedure: COLONOSCOPY WITH PROPOFOL;  Surgeon: Ladene Artist, MD;  Location: [facility] ENDOSCOPY;  Service: Endoscopy;  Laterality: N/A;   ESOPHAGOGASTRODUODENOSCOPY (EGD) WITH PROPOFOL N/A 07/11/2020   Procedure: ESOPHAGOGASTRODUODENOSCOPY (EGD) WITH PROPOFOL;  Surgeon: Ladene Artist, MD;  Location: [facility] ENDOSCOPY;  Service: Endoscopy;  Laterality: N/A;   TEE WITHOUT CARDIOVERSION N/A 07/13/2020   Procedure: TRANSESOPHAGEAL ECHOCARDIOGRAM (TEE);  Surgeon: Josue Hector, MD;  Location: [facility] ENDOSCOPY;  Service: Cardiovascular;  Laterality: N/A;   UMBILICAL HERNIA REPAIR N/A 04/27/2018   Procedure: REPAIR UMBILICAL HERNIA ERAS PATHWAY;  Surgeon: Alphonsa Overall, MD;  Location: [facility];  Service: General;  Laterality: N/A;   varicose veins     WRIST SURGERY      Current Medications: Current Meds  Medication Sig   amiodarone (PACERONE) 200 MG tablet Take 1 tablet (200 mg total) by mouth daily.   apixaban (ELIQUIS) 5 MG TABS tablet Take 1 tablet (5 mg total) by mouth 2 (two) times daily. HOLD until follow up with your doctor   doxycycline (VIBRAMYCIN) 100 MG capsule Take 100 mg by mouth 2 (two) times daily.   metoprolol succinate (TOPROL-XL) 50 MG 24 hr tablet Take 1 tablet (50 mg total) by mouth at bedtime. Take with or immediately following a meal.   Potassium Chloride ER 20 MEQ TBCR Take 20 mEq by mouth 2 (two) times daily.   [DISCONTINUED] bumetanide (BUMEX) 2 MG tablet TAKE THREE  TABLETS BY MOUTH TWICE A DAY     Allergies:   Diltiazem and Amiodarone   Social History   Socioeconomic History   Marital status: Married    Spouse name: Not on file   Number of children: 0   Years of education: Not on file   Highest education level: Not on file  Occupational History   Not on file  Tobacco Use   Smoking status: Never   Smokeless tobacco: Never  Vaping Use   Vaping Use: Never used  Substance and Sexual Activity   Alcohol use: Yes    Alcohol/week: 0.0 standard drinks of alcohol    Comment: occassional wine, martini  Drug use: No   Sexual activity: Not on file  Other Topics Concern   Not on file  Social History Narrative   Not on file   Social Determinants of Health   Financial Resource Strain: Not on file  Food Insecurity: Not on file  Transportation Needs: Not on file  Physical Activity: Not on file  Stress: Not on file  Social Connections: Not on file     Family History: The patient's family history is negative for Colon cancer, Esophageal cancer, Pancreatic cancer, Prostate cancer, Rectal cancer, and Stomach cancer.  ROS:   Please see the history of present illness.    All other systems are reviewed and are negative.   EKGs/Labs/Other Studies Reviewed:    The following studies were reviewed today: Extensive review of notes from his 3 hospitalizations in the last 3 months. Echocardiogram 03/19/2021  Study Result     1. Left ventricular ejection fraction, by estimation, is 55%. The left  ventricle has normal function. The left ventricle has no regional wall  motion abnormalities. There is severe left ventricular hypertrophy. Left  ventricular diastolic parameters are  indeterminate.   2. Right ventricular systolic function is normal. The right ventricular  size is normal.   3. Left atrial size was moderately dilated.   4. The mitral valve is abnormal. Trivial mitral valve regurgitation. No  evidence of mitral stenosis.   5. The  aortic valve is tricuspid. Aortic valve regurgitation is not  visualized. Mild to moderate aortic valve sclerosis/calcification is  present, without any evidence of aortic stenosis.   6. The inferior vena cava is normal in size with greater than 50%  respiratory variability, suggesting right atrial pressure of 3 mmHg.     Transesophageal echocardiogram 07/13/2020   1. TEE with no RAA/LAA thrombus DCC x 1 with 200J converted from atrial  flutter rate 133 bpm to NSR rate 88 bpm On eliquis with no immediate  neurologic sequelae.   2. EF decreased with patient in rapid atrial flutter rate 133. Left  ventricular ejection fraction, by estimation, is 45 to 50%. The left  ventricle has mildly decreased function. There is mild left ventricular  hypertrophy.   3. Right ventricular systolic function is severely reduced. The right  ventricular size is severely enlarged.   4. Left atrial size was moderately dilated. No left atrial/left atrial  appendage thrombus was detected.   5. Right atrial size was severely dilated.   6. The mitral valve is normal in structure. Mild mitral valve  regurgitation.   7. Tricuspid valve regurgitation is moderate.   8. The aortic valve is tricuspid. Aortic valve regurgitation is mild.  Mild aortic valve sclerosis is present, with no evidence of aortic valve  stenosis.   Venous duplex ultrasound 09/25/2021 RIGHT:  - No evidence of common femoral vein obstruction.     LEFT:  - No evidence of deep vein thrombosis in the lower extremity. No indirect evidence of obstruction proximal to the inguinal ligament.  - No cystic structure found in the popliteal fossa.   EKG:    EKG is  ordered today.  It shows sinus rhythm with first-degree AV block (PR 222 ms), right bundle branch block (QRS 130 ms), QTc 470 ms without ischemic changes Recent Labs: 12/04/2021: BNP 106.3; Hemoglobin 16.0; Platelets 177 01/31/2022: ALT 17; BUN 20; Creatinine, Ser 1.36; Magnesium 2.2;  Potassium 4.3; Sodium 141; TSH 5.580       Component Value Date/Time   CHOL 103 05/08/2020 0335  TRIG 71 05/08/2020 0335   HDL 27 (L) 05/08/2020 0335   CHOLHDL 3.8 05/08/2020 0335   VLDL 14 05/08/2020 0335   LDLCALC 62 05/08/2020 0335    Physical Exam:    VS:  BP 112/64 (BP Location: Left Arm, Patient Position: Sitting, Cuff Size: Large)   Pulse 75   Ht '5\' 7"'$  (1.702 m)   Wt (!) 323 lb 9.6 oz (146.8 kg)   SpO2 (!) 88%   BMI 50.68 kg/m     Wt Readings from Last 3 Encounters:  03/14/22 (!) 323 lb 9.6 oz (146.8 kg)  01/31/22 (!) 320 lb 12.8 oz (145.5 kg)  12/19/21 (!) 333 lb (151 kg)      General: Alert, oriented x3, no distress, morbidly obese Head: no evidence of trauma, PERRL, EOMI, no exophtalmos or lid lag, no myxedema, no xanthelasma; normal ears, nose and oropharynx Neck: normal jugular venous pulsations and no hepatojugular reflux; brisk carotid pulses without delay and no carotid bruits Chest: clear to auscultation, no signs of consolidation by percussion or palpation, normal fremitus, symmetrical and full respiratory excursions Cardiovascular: normal position and quality of the apical impulse, regular rhythm, normal first and second heart sounds, no murmurs, rubs or gallops Abdomen: no tenderness or distention, no masses by palpation, no abnormal pulsatility or arterial bruits, normal bowel sounds, no hepatosplenomegaly Extremities: He has bilateral cyanosis of the calves and feet and 3+ hard pitting edema all the way to just above the knees bilaterally.  A couple of pinpoint scabs in the right anterior shin but no active blisters or weeping wounds.  No evidence of cellulitis at this time. Neurological: grossly nonfocal Psych: Normal mood and affect   ASSESSMENT:    1. Typical atrial flutter ([place])   2. Chronic diastolic HF (heart failure) ([place])   3. Encounter for monitoring amiodarone therapy   4. Acquired thrombophilia ([place])   5. Postphlebitic syndrome   6.  Morbid obesity ([place])   7. Prediabetes   8. OSA (obstructive sleep apnea)   9. Obesity hypoventilation syndrome ([facility])            PLAN:    In order of problems listed above:  Atrial flutter: Maintaining sinus rhythm since cardioversion in May 2023.  Probably due to right atrial enlargement in the setting of cor pulmonale from untreated sleep apnea. Continue amiodarone (the rash in fact has improved, despite restarting amiodarone). CHF: Had another long discussion with the patient and his wife about need to limit dietary intake of sodium and take loop diuretics to prevent serious consequences of right heart failure.  Again he is severely hypervolemic, but only with findings of right heart failure, no left heart failure.  He has chronic cor pulmonale due to OSA and probably obesity hypoventilation syndrome.  BNP has been barely above the upper limit of normal.  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  Every time we increase his dose of loop diuretic he cut it back.  Previously his dry weight was around 260 lb (I doubt we will be able to reach this target without intravenous diuretics and without worsening renal function).  I think we need to try to get his weight down at least to 300 pounds avoid serious complications from leg edema, skin ulcerations, cellulitis. Amiodarone: This has clearly helped with rhythm maintenance and overall heart failure management.  We have reviewed the potential side effects.  Monitor LFTs and TSH every  6 months. Anticoagulation: Plan lifelong anticoagulation for both atrial arrhythmia and history of DVT with postphlebitic syndrome.  Has not had any bleeding problems. DVT: Has evidence of postphlebitic syndrome which further complicates the swelling related to right heart failure.  Plan lifelong anticoagulation.  Need to get him some proper compression stockings.  Will order from [employer]. Obesity: He is eating less due to poor appetite (may be due to right heart failure or amiodarone).  Extremely deconditioned.  At risk for falls.   Pre-DM:  has glucose levels in the borderline prediabetes range and good LDL cholesterol, but has a chronic severely depressed HDL level. OSA/probable obesity hypoventilation syndrome:   He tolerates low oxygen saturation without any subjective dyspnea, but this is the cause of his right heart failure and propensity for arrhythmia. Did a walk test in the office today  SATURATION QUALIFICATIONS: (This note is used to comply with regulatory documentation for home oxygen)  Patient Saturations on Room Air at Rest = 88% (95% on 2L O2)  Patient Saturations on Room Air while Ambulating = 80%  Patient Saturations on 2 Liters of oxygen while Ambulating = 92%  Please briefly explain why patient needs home oxygen: obesity hypoventilation syndrome and pulmonary hypertension.  Rash: Retrospectively this is unlikely to be amiodarone related.  Etiology remains uncertain.    Medication Adjustments/Labs and Tests Ordered: Current medicines are reviewed at length with the patient today.  Concerns regarding medicines are outlined above.  No orders of the defined types were placed in this encounter.      Meds ordered this encounter  Medications   bumetanide (BUMEX) 2 MG tablet    Sig: Take 2 tablets (4 mg total) by mouth 2 (two) times daily.    Dispense:  120 tablet    Refill:  2        Patient Instructions  Medication Instructions:  BUMEX: Take 4 mg twice daily (2 of the 2 mg tablets, twice a day)  *If you need a refill on your cardiac medications before your next appointment, please call your pharmacy*   Lab Work: None ordered If you have labs (blood work) drawn today and your tests are completely normal, you will receive your results only by: [place] (if you have MyChart) OR A paper copy in the mail If you have any lab  test that is abnormal or we need to change your treatment, we will call you to review the results.   Testing/Procedures: None ordered   Follow-Up: At [facility], you and your health needs are our priority.  As part of our continuing mission to provide you with exceptional heart care, we have created designated Provider Care Teams.  These Care Teams include your primary Cardiologist (physician) and Advanced Practice Providers (APPs -  Physician Assistants and Nurse Practitioners) who all work together to provide you with the care you need, when you need it.  We recommend signing up for the patient portal called "MyChart".  Sign up information is provided on this After Visit Summary.  MyChart is used to connect with patients for Virtual Visits (Telemedicine).  Patients are able to view lab/test results, encounter notes, upcoming appointments, etc.  Non-urgent messages can be sent to your provider as well.   To learn more about what you can do with MyChart, go to [email or web address].    Your next appointment:   Follow up on 11/29 at 8:40 with Dr. Sallyanne Kuster  Important Information About Sugar  Signed, Sanda Klein, MD  03/14/2022 11:17 AM    [place]

## 2022-03-14 NOTE — Patient Instructions (Signed)
Medication Instructions:  BUMEX: Take 4 mg twice daily (2 of the 2 mg tablets, twice a day)  *If you need a refill on your cardiac medications before your next appointment, please call your pharmacy*   Lab Work: None ordered If you have labs (blood work) drawn today and your tests are completely normal, you will receive your results only by: [place] (if you have MyChart) OR A paper copy in the mail If you have any lab test that is abnormal or we need to change your treatment, we will call you to review the results.   Testing/Procedures: None ordered   Follow-Up: At [facility], you and your health needs are our priority.  As part of our continuing mission to provide you with exceptional heart care, we have created designated Provider Care Teams.  These Care Teams include your primary Cardiologist (physician) and Advanced Practice Providers (APPs -  Physician Assistants and Nurse Practitioners) who all work together to provide you with the care you need, when you need it.  We recommend signing up for the patient portal called "MyChart".  Sign up information is provided on this After Visit Summary.  MyChart is used to connect with patients for Virtual Visits (Telemedicine).  Patients are able to view lab/test results, encounter notes, upcoming appointments, etc.  Non-urgent messages can be sent to your provider as well.   To learn more about what you can do with MyChart, go to [email or web address].    Your next appointment:   Follow up on 11/29 at 8:40 with Dr. Sallyanne Kuster  Important Information About Sugar

## 2022-03-16 ENCOUNTER — Other Ambulatory Visit: Payer: Self-pay | Admitting: Cardiovascular Disease

## 2022-04-14 ENCOUNTER — Encounter (INDEPENDENT_AMBULATORY_CARE_PROVIDER_SITE_OTHER): Payer: Medicare Other | Admitting: Ophthalmology

## 2022-04-14 ENCOUNTER — Encounter (INDEPENDENT_AMBULATORY_CARE_PROVIDER_SITE_OTHER): Payer: Self-pay

## 2022-04-15 ENCOUNTER — Encounter (INDEPENDENT_AMBULATORY_CARE_PROVIDER_SITE_OTHER): Payer: Self-pay

## 2022-04-15 ENCOUNTER — Ambulatory Visit (INDEPENDENT_AMBULATORY_CARE_PROVIDER_SITE_OTHER): Payer: Medicare Other | Admitting: Ophthalmology

## 2022-04-15 ENCOUNTER — Encounter (INDEPENDENT_AMBULATORY_CARE_PROVIDER_SITE_OTHER): Payer: Self-pay | Admitting: Ophthalmology

## 2022-04-15 DIAGNOSIS — H43822 Vitreomacular adhesion, left eye: Secondary | ICD-10-CM

## 2022-04-15 DIAGNOSIS — H43811 Vitreous degeneration, right eye: Secondary | ICD-10-CM

## 2022-04-15 DIAGNOSIS — H353221 Exudative age-related macular degeneration, left eye, with active choroidal neovascularization: Secondary | ICD-10-CM | POA: Diagnosis not present

## 2022-04-15 DIAGNOSIS — H353131 Nonexudative age-related macular degeneration, bilateral, early dry stage: Secondary | ICD-10-CM

## 2022-04-15 MED ORDER — BEVACIZUMAB CHEMO INJECTION 1.25MG/0.05ML SYRINGE FOR KALEIDOSCOPE
1.2500 mg | INTRAVITREAL | Status: AC | PRN
  Administered 2022-04-15: 1.25 mg via INTRAVITREAL

## 2022-04-15 NOTE — Assessment & Plan Note (Signed)
Physiologic OD

## 2022-04-15 NOTE — Progress Notes (Signed)
04/15/2022     CHIEF COMPLAINT Patient presents for  Chief Complaint  Patient presents with   Macular Degeneration      HISTORY OF PRESENT ILLNESS: Shane Diaz is a 76 y.o. 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 Last edited by Hurman Horn, MD on 04/15/2022 11:59 AM.      Referring physician: [name], MD [id number] [place],  [place] [address]  HISTORICAL INFORMATION:   Selected notes from the MEDICAL RECORD NUMBER    Lab Results  Component Value Date   HGBA1C 5.8 (H) 05/08/2020     CURRENT MEDICATIONS: No current outpatient medications on file. (Ophthalmic Drugs)   No current facility-administered medications for this visit. (Ophthalmic Drugs)   Current Outpatient Medications (Other)  Medication Sig   amiodarone (PACERONE) 200 MG tablet Take 1 tablet (200 mg total) by mouth daily.   apixaban (ELIQUIS) 5 MG TABS tablet Take 1 tablet (5 mg total) by mouth 2 (two) times daily. HOLD until follow up with your doctor   bumetanide (BUMEX) 2 MG tablet Take 2 tablets (4 mg total) by mouth 2 (two) times daily.   doxycycline (VIBRAMYCIN) 100 MG capsule Take 100 mg by mouth 2 (two) times daily.   Magnesium Oxide 400 MG CAPS Take 1 capsule (400 mg total) by mouth daily. (Patient not taking: Reported on 03/14/2022)   metoprolol succinate (TOPROL-XL) 50 MG 24 hr tablet Take 1 tablet (50 mg total) by mouth at bedtime. Take with or immediately following a meal.   nitroGLYCERIN (NITROSTAT) 0.4 MG SL tablet Place 1 tablet (0.4 mg total) under the tongue every 5 (five) minutes x 3 doses as needed for chest pain. (Patient not taking: Reported on 03/14/2022)   Potassium Chloride ER 20 MEQ  TBCR Take 20 mEq by mouth 2 (two) times daily.   No current facility-administered medications for this visit. (Other)      REVIEW OF SYSTEMS: ROS   Negative for: Constitutional, Gastrointestinal, Neurological, Skin, Genitourinary, Musculoskeletal, HENT, Endocrine, Cardiovascular, Eyes, Respiratory, Psychiatric, Allergic/Imm, Heme/Lymph Last edited by Orene Desanctis D, CMA on 04/15/2022 11:03 AM.       ALLERGIES Allergies  Allergen Reactions   Diltiazem     rash   Amiodarone Rash    PAST MEDICAL HISTORY Past Medical History:  Diagnosis Date   Acute diastolic heart failure ([place]) 08/03/2020   Arthritis    Diverticulosis 05/2015   Mild, noted on colonosocpy   Fatty liver 05/14/2015   noted on [place] ABD   History of colon polyps 05/2014   sessile in ascending colon, pedunculated in sigmoid colon, diverticulosis   Hyperlipidemia    pt denies   Hypertension    not currently taking medications per MD   OSA (obstructive sleep apnea) 08/03/2020   Renal cyst 05/2014   Small, Left, noted on [place] ABD   Umbilical hernia    Varicose veins    Past Surgical History:  Procedure Laterality Date   APPENDECTOMY     childhood   BIOPSY  07/11/2020   Procedure: BIOPSY;  Surgeon: Ladene Artist, MD;  Location: [facility];  Service: Endoscopy;;   CARDIOVERSION N/A  07/13/2020   Procedure: CARDIOVERSION;  Surgeon: Josue Hector, MD;  Location: [facility] ENDOSCOPY;  Service: Cardiovascular;  Laterality: N/A;   CARDIOVERSION N/A 08/02/2020   Procedure: CARDIOVERSION;  Surgeon: Werner Lean, MD;  Location: [facility] ENDOSCOPY;  Service: Cardiovascular;  Laterality: N/A;   CARDIOVERSION N/A 12/12/2021   Procedure: CARDIOVERSION;  Surgeon: Fay Records, MD;  Location: [facility];  Service: Cardiovascular;  Laterality: N/A;   COLONOSCOPY W/ POLYPECTOMY  05/23/2014   COLONOSCOPY WITH PROPOFOL N/A 07/11/2020   Procedure: COLONOSCOPY WITH PROPOFOL;  Surgeon: Ladene Artist, MD;  Location: [facility];  Service: Endoscopy;  Laterality: N/A;   ESOPHAGOGASTRODUODENOSCOPY (EGD) WITH PROPOFOL N/A 07/11/2020   Procedure: ESOPHAGOGASTRODUODENOSCOPY (EGD) WITH PROPOFOL;  Surgeon: Ladene Artist, MD;  Location: [facility]COPY;  Service: Endoscopy;  Laterality: N/A;   TEE WITHOUT CARDIOVERSION N/A 07/13/2020   Procedure: TRANSESOPHAGEAL ECHOCARDIOGRAM (TEE);  Surgeon: Josue Hector, MD;  Location: [facility] ENDOSCOPY;  Service: Cardiovascular;  Laterality: N/A;   UMBILICAL HERNIA REPAIR N/A 04/27/2018   Procedure: REPAIR UMBILICAL HERNIA ERAS PATHWAY;  Surgeon: Alphonsa Overall, MD;  Location: [facility] ORS;  Service: General;  Laterality: N/A;   varicose veins     WRIST SURGERY      FAMILY HISTORY Family History  Problem Relation Age of Onset   Colon cancer Neg Hx    Esophageal cancer Neg Hx    Pancreatic cancer Neg Hx    Prostate cancer Neg Hx    Rectal cancer Neg Hx    Stomach cancer Neg Hx     SOCIAL HISTORY Social History   Tobacco Use   Smoking status: Never   Smokeless tobacco: Never  Vaping Use   Vaping Use: Never used  Substance Use Topics   Alcohol use: Yes    Alcohol/week: 0.0 standard drinks of alcohol    Comment: occassional wine, martini   Drug use: No         OPHTHALMIC EXAM:  Base Eye Exam     Visual Acuity (Snellen - Linear)       Right Left   Dist  20/30 20/60 -2         Tonometry (Tonopen, 11:09 AM)       Right Left   Pressure 10 5         Pupils       Pupils   Right PERRL   Left PERRL         Visual Fields       Left Right    Full Full         Extraocular Movement       Right Left    Ortho Ortho    -- -- --  --  --  -- -- --   -- -- --  --  --  -- -- --           Dilation     Left eye: 2.5% Phenylephrine, 1.0% Mydriacyl @ 11:06 AM           Slit Lamp and Fundus Exam     External Exam       Right Left   External Normal Normal         Slit Lamp Exam       Right Left   Lids/Lashes Normal Normal    Conjunctiva/Sclera White and quiet White and quiet   Cornea Clear Clear   Anterior Chamber Deep and quiet Deep and quiet   [place]ris Centered posterior chamber intraocular lens  Centered posterior chamber intraocular lens   Lens Clear Clear   Anterior Vitreous Normal Normal         Fundus Exam       Right Left   Posterior Vitreous Posterior vitreous detachment Partial PVD   Disc not dilated per pt    C/D Ratio 0.3 0.35   Macula Early age related macular degeneration, no hemorrhage, no macular thickening Macular thickening, Retinal pigment epithelial atrophy, Intermediate age related macular degeneration, no hemorrhage   Vessels Normal Normal   Periphery Normal Normal            IMAGING AND PROCEDURES  Imaging and Procedures for 04/15/22  OCT, Retina - OU - Both Eyes       Right Eye Quality was good. Scan locations included subfoveal. Central Foveal Thickness: 255. Findings include retinal drusen .   Left Eye Quality was good. Scan locations included subfoveal. Central Foveal Thickness: 324. Findings include subretinal hyper-reflective material, choroidal neovascular membrane, subretinal fluid, vitreomacular adhesion .   Notes OD incidental PVD AMD drusen,  OS with subretinal fluid subfoveal with subretinal hyper reflective material     Color Fundus Photography Optos - OU - Both Eyes       Right Eye Progression has no prior data. Disc findings include normal observations. Macula : drusen. Vessels : normal observations.   Left Eye Progression has no prior data. Disc findings include normal observations. Macula : edema. Vessels : normal observations. Periphery : normal observations.      Intravitreal Injection, Pharmacologic Agent - OS - Left Eye       Time Out 04/15/2022. 11:58 AM. Confirmed correct patient, procedure, site, and patient consented.   Anesthesia Topical anesthesia was used. Anesthetic medications included Lidocaine 4%.   Procedure Preparation  included 5% betadine to ocular surface, 10% betadine to eyelids. A supplied needle was used.   Injection: 1.25 mg Bevacizumab 1.'25mg'$ /0.45m   Route: Intravitreal, Site: Left Eye   NDC: [id number]  Post-op Post injection exam found visual acuity of at least counting fingers. The patient tolerated the procedure well. There were no complications. Post injection medications included gentamicin.              ASSESSMENT/PLAN:  Posterior vitreous detachment of right eye Physiologic OD      ICD-10-CM   1. Exudative age-related macular degeneration of left eye with active choroidal neovascularization (HCC)  H35.3221 OCT, Retina - OU - Both Eyes    Color Fundus Photography Optos - OU - Both Eyes    Intravitreal Injection, Pharmacologic Agent - OS - Left Eye    Bevacizumab (AVASTIN) SOLN 1.25 mg    2. Early stage nonexudative age-related macular degeneration of both eyes  H35.3131 OCT, Retina - OU - Both Eyes    3. Vitreomacular adhesion of left eye  H43.822 OCT, Retina - OU - Both Eyes    4. Posterior vitreous detachment of right eye  H43.811       1.  OD no sign of CNVM or complication  2.  OS with VMA, not significant this time  3.  OS wet AMD requires injection intravitreal vegF promptly and reevaluate next in 6 weeks  Ophthalmic Meds Ordered this visit:  Meds ordered this encounter  Medications   Bevacizumab (AVASTIN) SOLN 1.25 mg       Return in about 6 weeks (around 05/27/2022) for dilate, OS, AVASTIN OCT.  There are no Patient Instructions on file for this visit.   Explained the diagnoses,  plan, and follow up with the patient and they expressed understanding.  Patient expressed understanding of the importance of proper follow up care.   Clent Demark Akeylah Hendel M.D. Diseases & Surgery of the Retina and Vitreous [facility]ina [facility] Diabetic [place] 04/15/22     Abbreviations: M myopia (nearsighted); A astigmatism; H hyperopia (farsighted); P presbyopia; Mrx spectacle  prescription;  CTL contact lenses; OD right eye; OS left eye; OU both eyes  XT exotropia; ET esotropia; PEK punctate epithelial keratitis; PEE punctate epithelial erosions; DES dry eye syndrome; MGD meibomian gland dysfunction; ATs artificial tears; PFAT's preservative free artificial tears; [place] nuclear sclerotic cataract; PSC posterior subcapsular cataract; ERM epi-retinal membrane; PVD posterior vitreous detachment; RD retinal detachment; DM diabetes mellitus; DR diabetic retinopathy; NPDR non-proliferative diabetic retinopathy; PDR proliferative diabetic retinopathy; CSME clinically significant macular edema; DME diabetic macular edema; dbh dot blot hemorrhages; CWS cotton wool spot; POAG primary open angle glaucoma; C/D cup-to-disc ratio; HVF humphrey visual field; GVF goldmann visual field; OCT optical coherence tomography; IOP intraocular pressure; BRVO Branch retinal vein occlusion; CRVO central retinal vein occlusion; CRAO central retinal artery occlusion; BRAO branch retinal artery occlusion; RT retinal tear; SB scleral buckle; PPV pars plana vitrectomy; VH Vitreous hemorrhage; PRP panretinal laser photocoagulation; IVK intravitreal kenalog; VMT vitreomacular traction; MH Macular hole;  NVD neovascularization of the disc; NVE neovascularization elsewhere; AREDS age related eye disease study; ARMD age related macular degeneration; POAG primary open angle glaucoma; EBMD epithelial/anterior basement membrane dystrophy; ACIOL anterior chamber intraocular lens; IOL intraocular lens; PCIOL posterior chamber intraocular lens; Phaco/IOL phacoemulsification with intraocular lens placement; [place] photorefractive keratectomy; LASIK laser assisted in situ keratomileusis; HTN hypertension; DM diabetes mellitus; COPD chronic obstructive pulmonary disease

## 2022-05-07 ENCOUNTER — Other Ambulatory Visit: Payer: Self-pay | Admitting: Cardiovascular Disease

## 2022-05-13 ENCOUNTER — Other Ambulatory Visit: Payer: Self-pay | Admitting: Cardiovascular Disease

## 2022-05-14 ENCOUNTER — Other Ambulatory Visit: Payer: Self-pay

## 2022-05-14 NOTE — Telephone Encounter (Signed)
Entered this patients chart to refill a medication. Looks as if the medication was refilled yesterday.

## 2022-05-15 ENCOUNTER — Other Ambulatory Visit: Payer: Self-pay | Admitting: *Deleted

## 2022-05-15 DIAGNOSIS — I4891 Unspecified atrial fibrillation: Secondary | ICD-10-CM

## 2022-05-15 MED ORDER — APIXABAN 5 MG PO TABS: 5.0000 mg | ORAL_TABLET | Freq: Two times a day (BID) | ORAL | 1 refills | Status: DC

## 2022-05-15 NOTE — Telephone Encounter (Signed)
Eliquis '5mg'$  refill request received. Patient is 76 years old, weight-146.8kg, Crea-1.36 on 01/31/2022, Diagnosis-Aflutter, and last seen by Dr. Sallyanne Kuster on 03/14/2022. Dose is appropriate based on dosing criteria. Will send in refill to requested pharmacy.    Plan lifelong anticoagulation for both atrial arrhythmia and history of DVT with postphlebitic syndrome.

## 2022-05-27 ENCOUNTER — Encounter (INDEPENDENT_AMBULATORY_CARE_PROVIDER_SITE_OTHER): Payer: Medicare Other | Admitting: Ophthalmology

## 2022-06-17 ENCOUNTER — Other Ambulatory Visit: Payer: Self-pay | Admitting: Cardiovascular Disease

## 2022-07-02 ENCOUNTER — Encounter: Payer: Self-pay | Admitting: Cardiovascular Disease

## 2022-07-02 ENCOUNTER — Ambulatory Visit: Payer: Medicare Other | Attending: Cardiovascular Disease | Admitting: Cardiovascular Disease

## 2022-07-02 VITALS — BP 100/58 | HR 72 | Ht 67.0 in | Wt 294.0 lb

## 2022-07-02 DIAGNOSIS — Z5181 Encounter for therapeutic drug level monitoring: Secondary | ICD-10-CM

## 2022-07-02 DIAGNOSIS — E662 Morbid (severe) obesity with alveolar hypoventilation: Secondary | ICD-10-CM

## 2022-07-02 DIAGNOSIS — I5032 Chronic diastolic (congestive) heart failure: Secondary | ICD-10-CM | POA: Diagnosis not present

## 2022-07-02 DIAGNOSIS — I483 Typical atrial flutter: Secondary | ICD-10-CM | POA: Diagnosis not present

## 2022-07-02 DIAGNOSIS — Z86718 Personal history of other venous thrombosis and embolism: Secondary | ICD-10-CM

## 2022-07-02 DIAGNOSIS — I872 Venous insufficiency (chronic) (peripheral): Secondary | ICD-10-CM

## 2022-07-02 DIAGNOSIS — D6869 Other thrombophilia: Secondary | ICD-10-CM | POA: Diagnosis not present

## 2022-07-02 DIAGNOSIS — Z79899 Other long term (current) drug therapy: Secondary | ICD-10-CM

## 2022-07-02 DIAGNOSIS — R7303 Prediabetes: Secondary | ICD-10-CM

## 2022-07-02 MED ORDER — BUMETANIDE 2 MG PO TABS: 4.0000 mg | ORAL_TABLET | Freq: Two times a day (BID) | ORAL | 2 refills | Status: DC

## 2022-07-02 NOTE — Progress Notes (Signed)
Cardiology new patient note:    Date:  07/06/2022   ID:  Shane Diaz, DOB [birth year], MRN [id number]  PCP:  Shane Panda, MD  Cardiologist:  Sanda Klein, MD New Electrophysiologist:  None   Referring MD: Shane Panda, MD   Chief Complaint  Patient presents with   Edema    History of Present Illness:    Shane Diaz is a 76 y.o. 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  Complicated by chronic cor pulmonale and right heart failure.  He came back to the office on 11/21/2021 due to rapid heart rates and had atrial flutter with 2: 1 AV block and a ventricular rate of 152 bpm.  Amiodarone was started.  (This had been stopped more than a year ago when he developed a rash.  The rash never disappeared despite stopping the medication).  2 weeks after starting amiodarone he underwent elective electrical cardioversion, successfully on 12/12/2021.  He has l normal rhythm since then.  The rash has not been any worse since starting amiodarone.  As before, he continues to have severe bilateral lower extremity edema.  Occasionally this leads to blisters or weeping wounds.  Last office appointment I recommended bumetanide 6 mg twice daily, but he again cut back the dose and at best is taking 4 mg in the morning and 2 mg in the evening he has not had hypotension, dizziness or syncope.   He denies shortness of breath but is extremely sedentary.  He spends his day in a chair from a computer.  His wife tries to curtail his sodium intake, but he loves to eat cured meats and pickles.  Despite his severe lower extremity edema, we have a lower weight recorded today.  Not sure this is accurate.  His blood pressure was initially 100/58, when I rechecked it 105/62.  We have had to use very large doses of loop diuretics to help with his lower extremity edema which has been complicated by blisters, active weeping and cellulitis.  He wears oxygen at night, but not during the daytime.  We have tried to schedule him for sleep studies repeatedly, for the large part he has refused and even when these were scheduled he did not follow through.  He does not think he will ever wear CPAP.  He denies angina, orthopnea, PND.  He has not had palpitations, dizziness or syncope but his legs are very weak and wobbly.  He has a collapsible wheelchair that he sometimes uses around the house.  He denies cough or hemoptysis.  He has not had falls or bleeding.   He had an estimated dry weight of 260 pounds when he was hospitalized in 2021, has had limited success with outpatient oral diuretics.  During the most recent hospitalization he was treated with intravenous diuretics, but this had to be discontinued when he developed acute kidney injury.  Creatinine was 1.9 at the time of discharge on 09/17/2021 and was still elevated at 2.0 on 09/19/2021.  Outpatient  labs showed improved creatinine down to 1.5.  He had Covid pneumonia requiring hospitalization in October 2021.  He was hospitalized with hypoxemia due to decompensated congestive heart failure and atrial flutter with rapid ventricular response in early December 2021.   On transthoracic echocardiogram LV EF was 50-55%, but the right ventricle was mildly dilated and mildly depressed.  TEE showed LVEF 45-50% and a severely enlarged and depressed right ventricle  with moderate tricuspid regurgitation. He underwent TEE guided cardioversion successfully on 07/13/2020 and discharged on beta-blockers and anticoagulants, but returned and was hospitalized again with similar problems on 08/01/2020.  An attempt at cardioversion on 08/02/2020 was not successful. IV Amiodarone was initiated.  Cardioversion was tried again the next day, again unsuccessfully.  The plan was to continue loading with amiodarone and bring back for cardioversion.  He was seen back in clinic in mid January and declined repeat cardioversion.  He was again hospitalized in February for volume overload complicated by cellulitis of the lower extremities, sepsis, atrial fibrillation rapid ventricular response.  He required fluid resuscitation with worsening edema.  The arrhythmia was managed conservatively and his volume status improved after intravenous diuretics.  At hospital discharge his dry weight was estimated to be 270 pounds.  At that weight, his wife reports that his legs were thin and that he no longer had edema or blisters or skin wounds.  Once the patient returned home, he returned to his old habits of adding salt to his food, eating a lot of processed meats and pickles.  He spends the day sitting in a chair at the computer and has developed gradually worsening lower extremity edema.  His weight has gone up 16 pounds and he has developed a new weeping wound on his right leg and blisters on the back of his left calf.  He has been unable to wear compression stockings and does not like to keep his legs elevated.  He also developed a "lobster red" pruritic rash on his neck and shoulders and lower limbs.  He stopped taking amiodarone 4 days before this appointment and the rash is beginning to improve.  amiodarone was stopped and a year later he still has a rash that waxed and waned in severity.  Amiodarone was restarted in April 2023 without any worsening of the rash which continues to wax and  wane.  In 2016, years ago he had a right distal femoral and popliteal DVT, partially resolved by follow-up ultrasound in late 2016 but with chronic occlusion of the right tibioperoneal venous trunk.  About 3 years ago he was having a lot of lower extremity pain related to large varicose veins and underwent venous ablation in his right lower extremity.  He had laser ablation of the left greater saphenous vein and stab phlebectomies of the left leg in November 2016  Starting in March 2020 he had gradually worsening pain and swelling in the left calf, more so than on the right.  In August of this year he developed severe swelling and discomfort in his left calf and was diagnosed with a DVT and started on Xarelto.  Switched to Eliquis later because of suspected allergy (I suspect this was actually only normal skin changes related to brawny edema).     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  He has been living in [place] for the last 10  years or so.  Past Medical History:  Diagnosis Date   Acute diastolic heart failure ([place]) 08/03/2020   Arthritis    Diverticulosis 05/2015   Mild, noted on colonosocpy   Fatty liver 05/14/2015   noted on [place] ABD   History of colon polyps 05/2014   sessile in ascending colon, pedunculated in sigmoid colon, diverticulosis   Hyperlipidemia    pt denies   Hypertension    not currently taking medications per MD   OSA (obstructive sleep apnea) 08/03/2020   Renal cyst 05/2014   Small, Left, noted on [place] ABD   Umbilical hernia    Varicose veins     Past Surgical History:  Procedure Laterality Date   APPENDECTOMY     childhood   BIOPSY  07/11/2020   Procedure: BIOPSY;  Surgeon: Ladene Artist, MD;  Location: [place];  Service: Endoscopy;;   CARDIOVERSION N/A 07/13/2020   Procedure: CARDIOVERSION;  Surgeon:  Josue Hector, MD;  Location: [place];  Service: Cardiovascular;  Laterality: N/A;   CARDIOVERSION N/A 08/02/2020   Procedure: CARDIOVERSION;  Surgeon: Werner Lean, MD;  Location: [facility];  Service: Cardiovascular;  Laterality: N/A;   CARDIOVERSION N/A 12/12/2021   Procedure: CARDIOVERSION;  Surgeon: Fay Records, MD;  Location: [place];  Service: Cardiovascular;  Laterality: N/A;   COLONOSCOPY W/ POLYPECTOMY  05/23/2014   COLONOSCOPY WITH PROPOFOL N/A 07/11/2020   Procedure: COLONOSCOPY WITH PROPOFOL;  Surgeon: Ladene Artist, MD;  Location: [facility];  Service: Endoscopy;  Laterality: N/A;   ESOPHAGOGASTRODUODENOSCOPY (EGD) WITH PROPOFOL N/A 07/11/2020   Procedure: ESOPHAGOGASTRODUODENOSCOPY (EGD) WITH PROPOFOL;  Surgeon: Ladene Artist, MD;  Location: [facility] ENDOSCOPY;  Service: Endoscopy;  Laterality: N/A;   TEE WITHOUT CARDIOVERSION N/A 07/13/2020   Procedure: TRANSESOPHAGEAL ECHOCARDIOGRAM (TEE);  Surgeon: Josue Hector, MD;  Location: [place];  Service: Cardiovascular;  Laterality: N/A;   UMBILICAL HERNIA REPAIR N/A 04/27/2018   Procedure: REPAIR UMBILICAL HERNIA ERAS PATHWAY;  Surgeon: Alphonsa Overall, MD;  Location: [facility];  Service: General;  Laterality: N/A;   varicose veins     WRIST SURGERY      Current Medications: Current Meds  Medication Sig   amiodarone (PACERONE) 200 MG tablet TAKE ONE TABLET BY MOUTH DAILY   apixaban (ELIQUIS) 5 MG TABS tablet Take 1 tablet (5 mg total) by mouth 2 (two) times daily.   metoprolol succinate (TOPROL-XL) 50 MG 24 hr tablet TAKE ONE TABLET BY MOUTH DAILY WITH OR IMMEDIATELY FOLLOWING A MEAL   [DISCONTINUED] bumetanide (BUMEX) 2 MG tablet TAKE 3 TABLETS BY MOUTH TWICE A DAY     Allergies:   Diltiazem and Amiodarone   Social History   Socioeconomic History   Marital status: Married    Spouse name: Not on file   Number of children: 0   Years of education: Not on file   Highest education level: Not on  file  Occupational History   Not on file  Tobacco Use   Smoking status: Never   Smokeless tobacco: Never  Vaping Use   Vaping Use: Never used  Substance and Sexual Activity   Alcohol use: Yes    Alcohol/week: 0.0 standard drinks of alcohol    Comment: occassional wine, martini   Drug use: No   Sexual activity: Not on file  Other Topics Concern   Not on file  Social History Narrative   Not on file   Social Determinants of Health   Financial Resource Strain: Not on file  Food Insecurity: Not on file  Transportation Needs: Not on file  Physical Activity: Not on file  Stress: Not on file  Social Connections: Not on file     Family History: The patient's family history is negative for Colon cancer, Esophageal cancer, Pancreatic cancer, Prostate cancer, Rectal cancer, and Stomach cancer.  ROS:   Please see the history of present illness.    All other systems are reviewed and are negative.   EKGs/Labs/Other Studies Reviewed:    The following studies were reviewed today: Extensive review of notes from his 3 hospitalizations in the last 3 months. Echocardiogram 03/19/2021  Study Result     1. Left ventricular ejection fraction, by estimation, is 55%. The left  ventricle has normal function. The left ventricle has no regional wall  motion abnormalities. There is severe left ventricular hypertrophy. Left  ventricular diastolic parameters are  indeterminate.   2. Right ventricular systolic function is normal. The right ventricular  size is normal.   3. Left atrial size was moderately dilated.   4. The mitral valve is abnormal. Trivial mitral valve regurgitation. No  evidence of mitral stenosis.   5. The aortic valve is tricuspid. Aortic valve regurgitation is not  visualized. Mild to moderate aortic valve sclerosis/calcification is  present, without any evidence of aortic stenosis.   6. The inferior vena cava is normal in size with greater than 50%  respiratory  variability, suggesting right atrial pressure of 3 mmHg.     Transesophageal echocardiogram 07/13/2020   1. TEE with no RAA/LAA thrombus DCC x 1 with 200J converted from atrial  flutter rate 133 bpm to NSR rate 88 bpm On eliquis with no immediate  neurologic sequelae.   2. EF decreased with patient in rapid atrial flutter rate 133. Left  ventricular ejection fraction, by estimation, is 45 to 50%. The left  ventricle has mildly decreased function. There is mild left ventricular  hypertrophy.   3. Right ventricular systolic function is severely reduced. The right  ventricular size is severely enlarged.   4. Left atrial size was moderately dilated. No left atrial/left atrial  appendage thrombus was detected.   5. Right atrial size was severely dilated.   6. The mitral valve is normal in structure. Mild mitral valve  regurgitation.   7. Tricuspid valve regurgitation is moderate.   8. The aortic valve is tricuspid. Aortic valve regurgitation is mild.  Mild aortic valve sclerosis is present, with no evidence of aortic valve  stenosis.   Venous duplex ultrasound 09/25/2021 RIGHT:  - No evidence of common femoral vein obstruction.     LEFT:  - No evidence of deep vein thrombosis in the lower extremity. No indirect evidence of obstruction proximal to the inguinal ligament.  - No cystic structure found in the popliteal fossa.   EKG:    EKG is not ordered today.  Most recent tracing sinus rhythm with first-degree AV block (PR 222 ms), right bundle branch block (QRS 130 ms), QTc 470 ms without ischemic changes Recent Labs: 12/04/2021: BNP 106.3; Hemoglobin 16.0; Platelets 177 07/02/2022: ALT 11; BUN 19; Creatinine, Ser 1.60; Magnesium 1.9; Potassium 4.2; Sodium 141; TSH 7.820       Component Value Date/Time   CHOL 103 05/08/2020 0335   TRIG 71 05/08/2020 0335   HDL 27 (L) 05/08/2020 0335   CHOLHDL 3.8 05/08/2020 0335   VLDL 14 05/08/2020 0335   LDLCALC 62 05/08/2020 0335     Physical Exam:    VS:  BP Marland Kitchen)  100/58 (BP Location: Left Arm, Patient Position: Sitting, Cuff Size: Large)   Pulse 72   Ht '5\' 7"'$  (1.702 m)   Wt 133.4 kg   SpO2 (!) 88%   BMI 46.05 kg/m     Wt Readings from Last 3 Encounters:  07/02/22 133.4 kg  03/14/22 (!) 146.8 kg  01/31/22 (!) 145.5 kg       General: Alert, oriented x3, no distress, morbidly obese Head: no evidence of trauma, PERRL, EOMI, no exophtalmos or lid lag, no myxedema, no xanthelasma; normal ears, nose and oropharynx Neck: Unable to see the jugular venous pulsations or evaluate hepatojugular reflux; brisk carotid pulses without delay and no carotid bruits Chest: clear to auscultation, no signs of consolidation by percussion or palpation, normal fremitus, symmetrical and full respiratory excursions Cardiovascular: normal position and quality of the apical impulse, regular rhythm, normal first and second heart sounds, no murmurs, rubs or gallops Abdomen: Obese, appears distended Extremities: Chronic cyanosis from the knees down, chronic brawny edema, currently with a few tiny blisters, no active weeping, no evidence of cellulitis Neurological: grossly nonfocal Psych: Normal mood and affect    ASSESSMENT:    1. Typical atrial flutter ([place])   2. Chronic diastolic HF (heart failure) ([place])   3. Encounter for monitoring amiodarone therapy   4. Acquired thrombophilia ([place])   5. Venous insufficiency of both lower extremities   6. History of recurrent deep vein thrombosis (DVT)   7. Obesity, Class III, BMI 40-49.9 (morbid obesity) ([place])   8. Prediabetes   9. Obesity hypoventilation syndrome ([facility])             PLAN:    In order of problems listed above:  Atrial flutter: Maintaining sinus rhythm since cardioversion in May 2023, on amiodarone.  Probably due to right atrial enlargement in the setting of cor pulmonale from untreated sleep apnea. Continue amiodarone (the rash in fact has improved, despite  restarting amiodarone). CHF: She has findings of severe right heart failure, DM type II which is even more prominent due to his issues with peripheral venous insufficiency. He has chronic cor pulmonale due to OSA and probably obesity hypoventilation syndrome.  BNP has been barely above the upper limit of normal.  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  Again, every time we increase his dose of diuretics he promptly decreases his dose of his own decision and remains severely hypervolemic.  Previously his dry weight was around 260 lb (I doubt we will be able to reach this target without intravenous diuretics and without worsening renal function).  There is the first time in a very long time that his weight has been under 300 pounds, but I am not so sure is accurate. Amiodarone: Has clearly helped with rhythm maintenance and attempts to get him off this medication related to recurrent atrial flutter.  We have discussed potential liver, thyroid, lung side effects, etc.  Needs to have liver and thyroid function tests checked at least every 6 months.  He is due today. Anticoagulation: Has not had any bleeding issues.  Plan lifelong anticoagulation for both atrial arrhythmia and history of DVT with postphlebitic syndrome.   DVT: Has evidence of postphlebitic syndrome which further complicates the swelling related to right heart failure.  Plan lifelong anticoagulation.  Wears compression stockings at home intermittently. Obesity: He is eating less due to poor appetite (may be due to right heart failure or amiodarone).  Extremely deconditioned.  At risk for falls.   Pre-DM: Consistently has glucose levels in the prediabetes range 100-120.  Does not have full-blown diabetes. OSA/probable obesity hypoventilation syndrome:   He tolerates low oxygen saturation without any subjective dyspnea, but this is the cause of his right heart failure  and propensity for arrhythmia. Did a walk test in the office today  SATURATION QUALIFICATIONS: (This note is used to comply with regulatory documentation for home oxygen)  Patient Saturations on Room Air at Rest = 88% (95% on 2L O2)  Patient Saturations on Room Air while Ambulating = 80%  Patient Saturations on 2 Liters of oxygen while Ambulating = 92%  Please briefly explain why patient needs home oxygen: obesity hypoventilation syndrome and pulmonary hypertension.  Rash: Retrospectively this is unlikely to be amiodarone related.  Etiology remains uncertain.  It has improved    Medication Adjustments/Labs and Tests Ordered: Current medicines are reviewed at length with the patient today.  Concerns regarding medicines are outlined above.  Orders Placed This Encounter  Procedures   TSH   T4, free   Comprehensive metabolic panel   Magnesium       Meds ordered this encounter  Medications   bumetanide (BUMEX) 2 MG tablet    Sig: Take 2 tablets (4 mg total) by mouth 2 (two) times daily.    Dispense:  120 tablet    Refill:  2        Patient Instructions  Medication Instructions:  Take Metoprolol to evening. *If you need a refill on your cardiac medications before your next appointment, please call your pharmacy*   Lab Work: CMET, TSH, MAG, Free T4   If you have labs (blood work) drawn today and your tests are completely normal, you will receive your results only by: [place] (if you have MyChart) OR A paper copy in the mail If you have any lab test that is abnormal or we need to change your treatment, we will call you to review the results.   Follow-Up: At [facility], you and your health needs are our priority.  As part of our continuing mission to provide you with exceptional heart care, we have created designated Provider Care Teams.  These Care Teams include your primary Cardiologist (physician) and Advanced Practice Providers (APPs -  Physician  Assistants and Nurse Practitioners) who all work together to provide you with the care you need, when you need it.  We recommend signing up for the patient portal called "MyChart".  Sign up information is provided on this After Visit Summary.  MyChart is used to connect with patients for Virtual Visits (Telemedicine).  Patients are able to view lab/test results, encounter notes, upcoming appointments, etc.  Non-urgent messages can be sent to your provider as well.   To learn more about what you can do with MyChart, go to [email or web address].    Your next appointment:   3 month(s)  The format for your next appointment:   In Person  Provider:   Sanda Klein, MD           Signed, Sanda Klein, MD  07/06/2022 5:06 PM    [place]

## 2022-07-02 NOTE — Patient Instructions (Signed)
Medication Instructions:  Take Metoprolol to evening. *If you need a refill on your cardiac medications before your next appointment, please call your pharmacy*   Lab Work: CMET, TSH, MAG, Free T4   If you have labs (blood work) drawn today and your tests are completely normal, you will receive your results only by: [place] (if you have MyChart) OR A paper copy in the mail If you have any lab test that is abnormal or we need to change your treatment, we will call you to review the results.   Follow-Up: At [facility], you and your health needs are our priority.  As part of our continuing mission to provide you with exceptional heart care, we have created designated Provider Care Teams.  These Care Teams include your primary Cardiologist (physician) and Advanced Practice Providers (APPs -  Physician Assistants and Nurse Practitioners) who all work together to provide you with the care you need, when you need it.  We recommend signing up for the patient portal called "MyChart".  Sign up information is provided on this After Visit Summary.  MyChart is used to connect with patients for Virtual Visits (Telemedicine).  Patients are able to view lab/test results, encounter notes, upcoming appointments, etc.  Non-urgent messages can be sent to your provider as well.   To learn more about what you can do with MyChart, go to [email or web address].    Your next appointment:   3 month(s)  The format for your next appointment:   In Person  Provider:   Sanda Klein, MD

## 2022-07-03 LAB — COMPREHENSIVE METABOLIC PANEL
ALT: 11 IU/L (ref 0–44)
AST: 21 IU/L (ref 0–40)
Albumin/Globulin Ratio: 1.5 (ref 1.2–2.2)
Albumin: 4.4 g/dL (ref 3.8–4.8)
Alkaline Phosphatase: 92 IU/L (ref 44–121)
BUN/Creatinine Ratio: 12 (ref 10–24)
BUN: 19 mg/dL (ref 8–27)
Bilirubin Total: 0.7 mg/dL (ref 0.0–1.2)
CO2: 37 mmol/L — ABNORMAL HIGH (ref 20–29)
Calcium: 9.8 mg/dL (ref 8.6–10.2)
Chloride: 92 mmol/L — ABNORMAL LOW (ref 96–106)
Creatinine, Ser: 1.6 mg/dL — ABNORMAL HIGH (ref 0.76–1.27)
Globulin, Total: 2.9 g/dL (ref 1.5–4.5)
Glucose: 100 mg/dL — ABNORMAL HIGH (ref 70–99)
Potassium: 4.2 mmol/L (ref 3.5–5.2)
Sodium: 141 mmol/L (ref 134–144)
Total Protein: 7.3 g/dL (ref 6.0–8.5)
eGFR: 44 mL/min/{1.73_m2} — ABNORMAL LOW (ref >59)

## 2022-07-03 LAB — TSH: TSH: 7.82 u[IU]/mL — ABNORMAL HIGH (ref 0.450–4.500)

## 2022-07-03 LAB — MAGNESIUM: Magnesium: 1.9 mg/dL (ref 1.6–2.3)

## 2022-07-03 LAB — T4, FREE: Free T4: 1.52 ng/dL (ref 0.82–1.77)

## 2022-07-06 ENCOUNTER — Encounter: Payer: Self-pay | Admitting: Cardiovascular Disease

## 2022-07-10 IMAGING — CT CT ANGIO CHEST
2 of 7 series · 17 of 46 positions shown · IV contrast (APPLIED)
Comparison: Chest radiograph from earlier today. 07/14/2020 chest
CT angiogram.

CLINICAL DATA: Tachycardia for 2 days. Atrial flutter. PE
suspected, high prob.

EXAM:
CT ANGIOGRAPHY CHEST WITH CONTRAST
TECHNIQUE: Multidetector CT imaging of the chest was performed using the
standard protocol during bolus administration of intravenous
contrast. Multiplanar CT image reconstructions and MIPs were
obtained to evaluate the vascular anatomy.
CONTRAST:  100mL OMNIPAQUE IOHEXOL 350 MG/ML SOLN

[Series 7: thins · axial · 0.96mm/px · z∈[-76,+192]mm · 14 of 431 slices shown]
[im 24/431  lung]
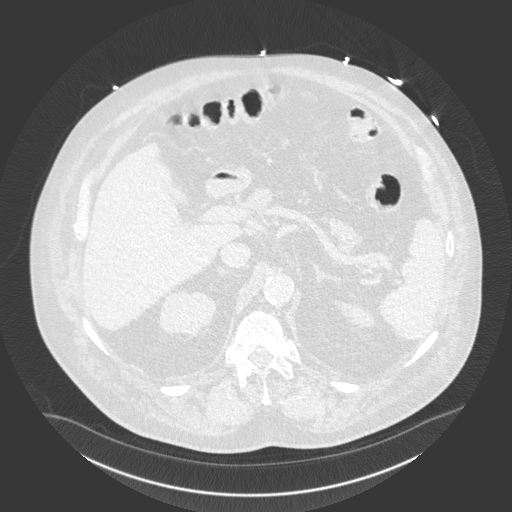
[im 48/431  soft-tissue]
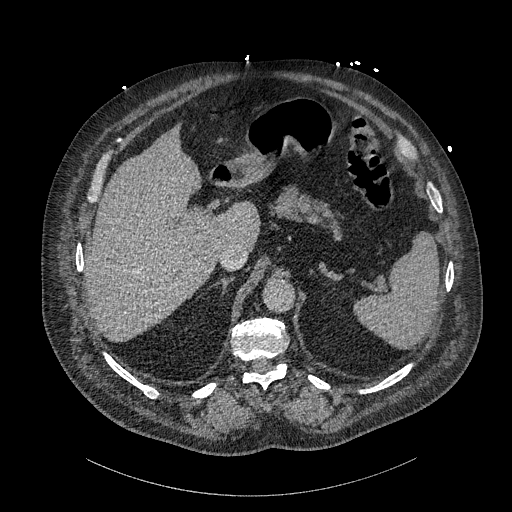
[im 96/431  lung]
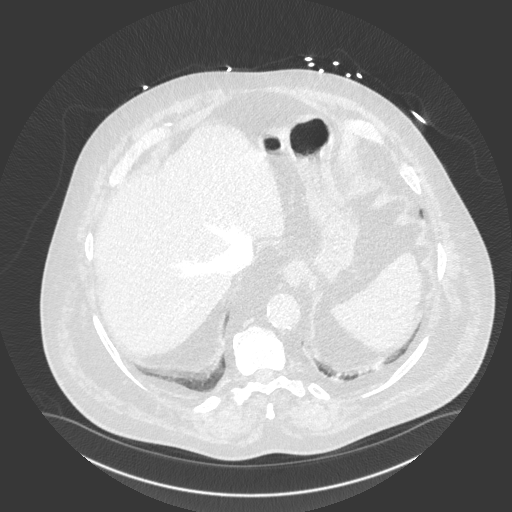
[im 120/431  soft-tissue]
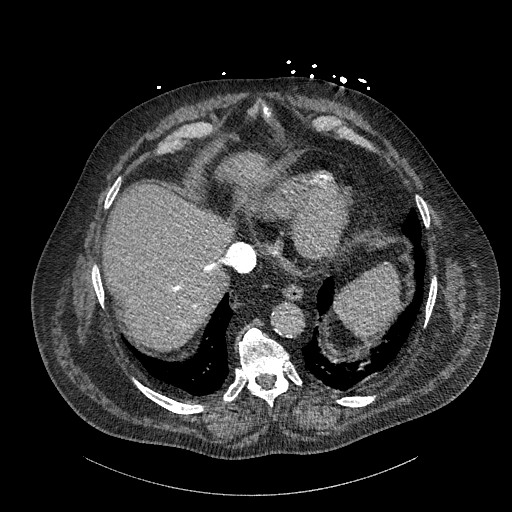
[im 144/431  lung]
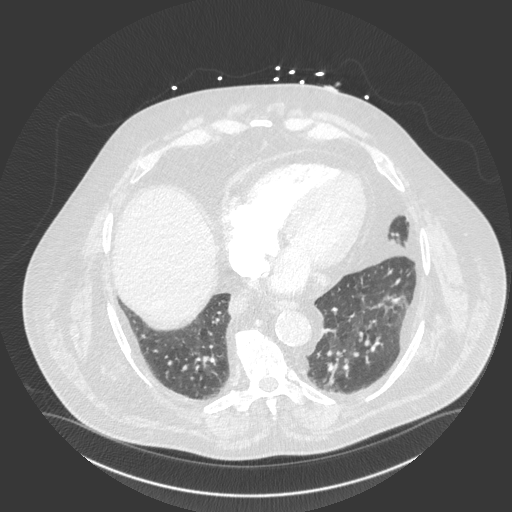
[im 168/431  soft-tissue]
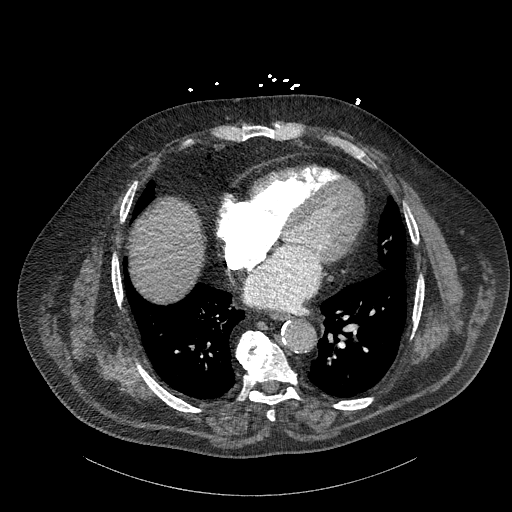
[im 192/431  lung]
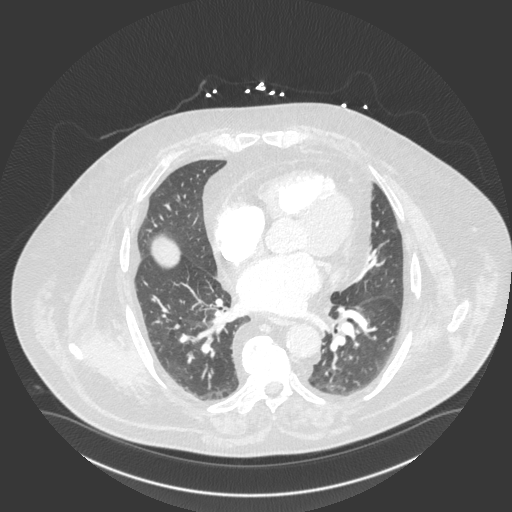
[im 239/431  soft-tissue]
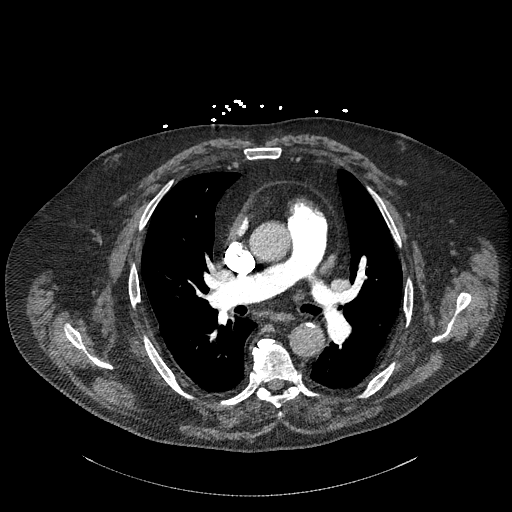
[im 263/431  lung]
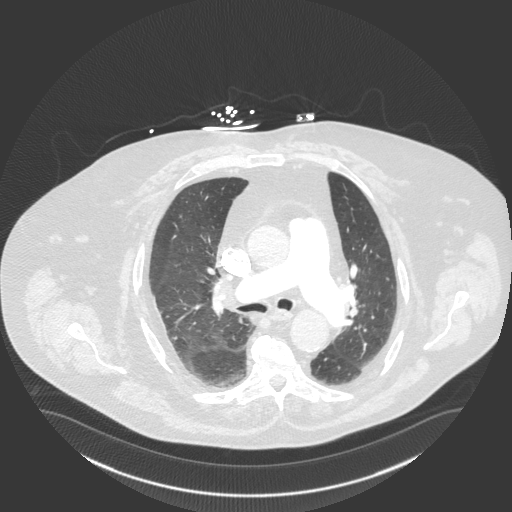
[im 287/431  soft-tissue]
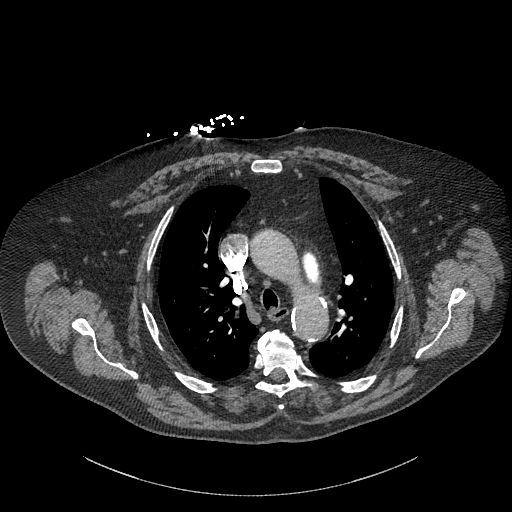
[im 311/431  lung]
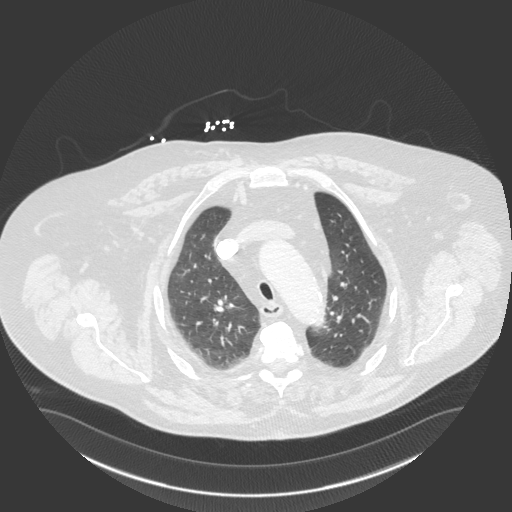
[im 335/431  soft-tissue]
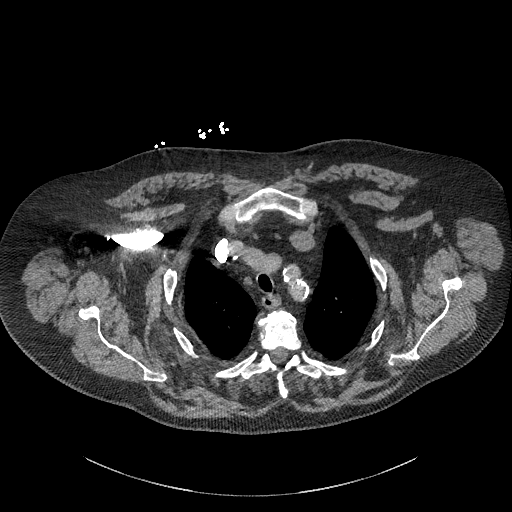
[im 383/431  lung]
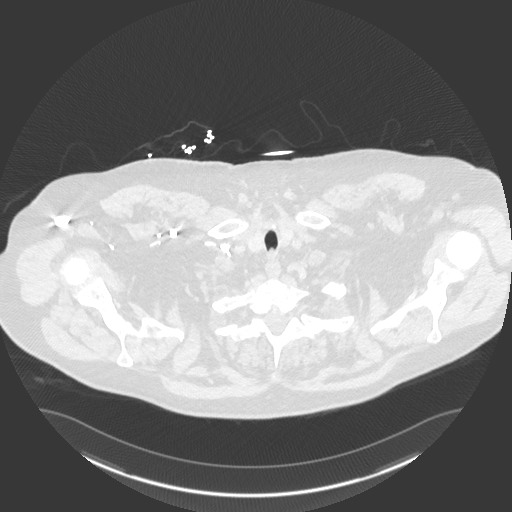
[im 407/431  soft-tissue]
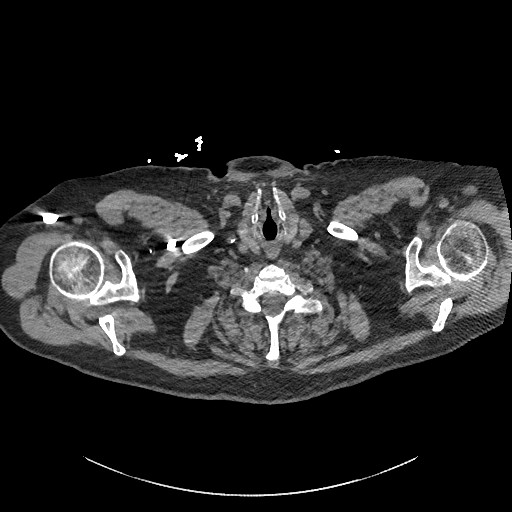

[Series 9: cor · coronal · 0.64mm/px · 3 of 187 slices shown]
[im 47/187  soft-tissue]
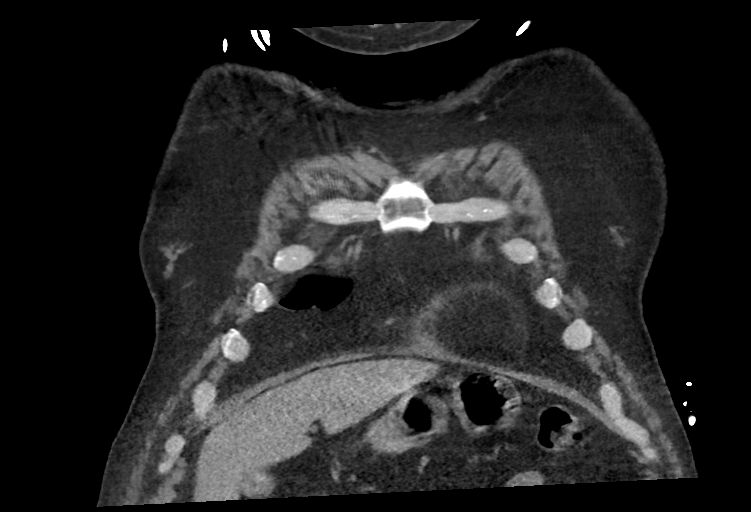
[im 94/187  soft-tissue]
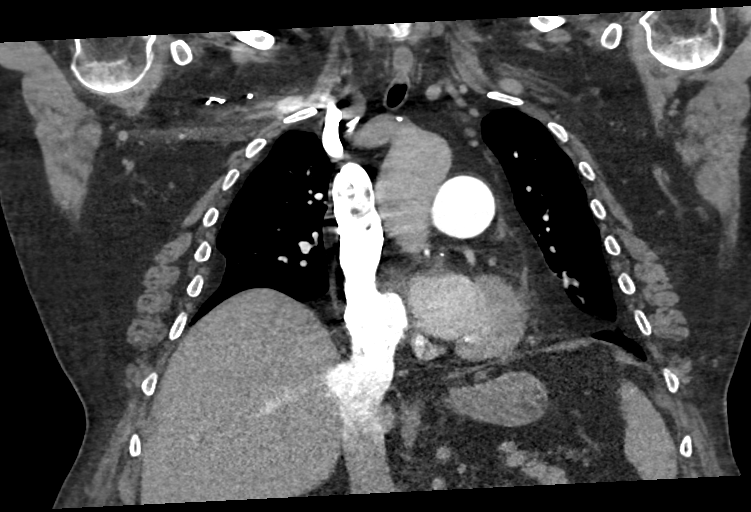
[im 140/187  soft-tissue]
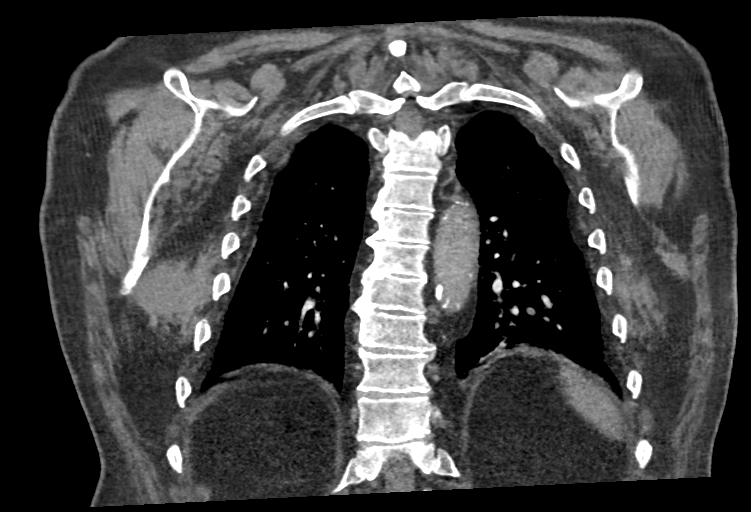

[17 of 46 positions shown; findings below may reference images not displayed]

FINDINGS: Cardiovascular: The study is high quality for the evaluation of
pulmonary embolism. There are no filling defects in the central,
lobar, segmental or subsegmental pulmonary artery branches to
suggest acute pulmonary embolism. Atherosclerotic thoracic aorta
with mildly dilated 4.1 cm ascending thoracic aorta, stable. Stable
dilated main pulmonary artery (3.9 cm diameter). Stable mild
cardiomegaly. No significant pericardial fluid/thickening. Left
anterior descending and left circumflex coronary atherosclerosis.

Mediastinum/Nodes: No discrete thyroid nodules. Unremarkable
esophagus. No pathologically enlarged axillary, mediastinal or hilar
lymph nodes. Stable small amount of fluid in the right lower
posterior mediastinum probably trapped within a fat containing
hiatal hernia sac.

Lungs/Pleura: No pneumothorax. No pleural effusion. No acute
consolidative airspace disease, lung masses or significant pulmonary
nodules. Chronic diffuse bronchial wall thickening.

Upper abdomen: Simple 2.2 cm lateral upper left renal cyst.

Musculoskeletal: No aggressive appearing focal osseous lesions.
Marked thoracic spondylosis. Poorly marginated soft tissue lesions
in posterior chest wall bilaterally, right greater than left,
located between the inferior scapula and posterolateral ribs,
largest 6.2 x 3.3 cm on the right (series 6/image 85), not
appreciably changed since 07/14/2020 CT.

Review of the MIP images confirms the above findings.
IMPRESSION: 1. No pulmonary embolism.
2. Stable mild cardiomegaly.
3. Stable dilated main pulmonary artery, suggesting chronic
pulmonary arterial hypertension.
4. Two-vessel coronary atherosclerosis.
5. Chronic diffuse bronchial wall thickening, as can be seen with
chronic bronchitis or reactive airways disease.
6. Poorly marginated soft tissue lesions in the posterior chest wall
bilaterally, right greater than left, not appreciably changed since
07/14/2020 CT. Findings are nonspecific, with the location most
typical of elastofibroma dorsi. Suggest correlation with any
clinical symptoms referable to this location. Consider further
characterization with MRI chest without and with IV contrast, versus
chest CT with IV contrast follow-up in 6 months, as clinically
warranted.
7. Aortic Atherosclerosis ([id number]).

## 2022-08-26 NOTE — Progress Notes (Signed)
[facility] Note  08/27/2022     CHIEF COMPLAINT Patient presents for Retina Evaluation   HISTORY OF PRESENT ILLNESS: Shane Diaz is a 77 y.o. male who presents to the clinic today for:   HPI     Retina Evaluation   In left eye.  This started 1 month ago.  Duration of 1 month.  I, the attending physician,  performed the HPI with the patient and updated documentation appropriately.        Comments   Patient here for Retina Evaluation. Patient saw Dr. Zadie Rhine. Patient states vision not good OS. Has gotten shots in OS for fluid. Has had only one shot. No eye pain. Using no drops. Sees good during the day. At night when driving sees 4 headlight instead of 2.      Last edited by Bernarda Caffey, MD on 08/27/2022 11:54 AM.    Dr. Zadie Rhine pt here due to insurance purposes, pt has received 2 IVA injections with him, he thinks the last appt was in December before Christmas  Referring physician: [name], MD [id number],  [place] [address]  HISTORICAL INFORMATION:   Selected notes from the MEDICAL RECORD NUMBER Dr. Zadie Rhine pt -- transferring care due to insurance LEE:  Ocular Hx- PMH-    CURRENT MEDICATIONS: No current outpatient medications on file. (Ophthalmic Drugs)   No current facility-administered medications for this visit. (Ophthalmic Drugs)   Current Outpatient Medications (Other)  Medication Sig   amiodarone (PACERONE) 200 MG tablet TAKE ONE TABLET BY MOUTH DAILY   apixaban (ELIQUIS) 5 MG TABS tablet Take 1 tablet (5 mg total) by mouth 2 (two) times daily.   bumetanide (BUMEX) 2 MG tablet Take 2 tablets (4 mg total) by mouth 2 (two) times daily.   metoprolol succinate (TOPROL-XL) 50 MG 24 hr tablet TAKE ONE TABLET BY MOUTH DAILY WITH OR IMMEDIATELY FOLLOWING A MEAL   doxycycline (VIBRAMYCIN) 100 MG capsule Take 100 mg by mouth 2 (two) times daily. (Patient not taking: Reported on 07/02/2022)   Magnesium Oxide 400 MG CAPS Take  1 capsule (400 mg total) by mouth daily. (Patient not taking: Reported on 07/02/2022)   nitroGLYCERIN (NITROSTAT) 0.4 MG SL tablet Place 1 tablet (0.4 mg total) under the tongue every 5 (five) minutes x 3 doses as needed for chest pain. (Patient not taking: Reported on 07/02/2022)   Potassium Chloride ER 20 MEQ TBCR Take 20 mEq by mouth 2 (two) times daily. (Patient not taking: Reported on 07/02/2022)   No current facility-administered medications for this visit. (Other)   REVIEW OF SYSTEMS: ROS   Positive for: Eyes Last edited by Theodore Demark, COA on 08/27/2022  9:17 AM.     ALLERGIES Allergies  Allergen Reactions   Diltiazem     rash   Amiodarone Rash   PAST MEDICAL HISTORY Past Medical History:  Diagnosis Date   Acute diastolic heart failure ([place]) 08/03/2020   Arthritis    Diverticulosis 05/2015   Mild, noted on colonosocpy   Fatty liver 05/14/2015   noted on [place] ABD   History of colon polyps 05/2014   sessile in ascending colon, pedunculated in sigmoid colon, diverticulosis   Hyperlipidemia    pt denies   Hypertension    not currently taking medications per MD   OSA (obstructive sleep apnea) 08/03/2020   Renal cyst 05/2014   Small, Left, noted on [place] ABD   Umbilical hernia    Varicose veins  Past Surgical History:  Procedure Laterality Date   APPENDECTOMY     childhood   BIOPSY  07/11/2020   Procedure: BIOPSY;  Surgeon: Ladene Artist, MD;  Location: [facility];  Service: Endoscopy;;   CARDIOVERSION N/A 07/13/2020   Procedure: CARDIOVERSION;  Surgeon: Josue Hector, MD;  Location: [facility] END[facility]COPY;  Service: Cardiovascular;  Laterality: N/A;   CARDIOVERSION N/A 08/02/2020   Procedure: CARDIOVERSION;  Surgeon: Werner Lean, MD;  Location: [facility] ENDOSCOPY;  Service: Cardiovascular;  Laterality: N/A;   CARDIOVERSION N/A 12/12/2021   Procedure: CARDIOVERSION;  Surgeon: Fay Records, MD;  Location: [facility];  Service: Cardiovascular;  Laterality:  N/A;   COLONOSCOPY W/ POLYPECTOMY  05/23/2014   COLONOSCOPY WITH PROPOFOL N/A 07/11/2020   Procedure: COLONOSCOPY WITH PROPOFOL;  Surgeon: Ladene Artist, MD;  Location: [facility];  Service: Endoscopy;  Laterality: N/A;   ESOPHAGOGASTRODUODENOSCOPY (EGD) WITH PROPOFOL N/A 07/11/2020   Procedure: ESOPHAGOGASTRODUODENOSCOPY (EGD) WITH PROPOFOL;  Surgeon: Ladene Artist, MD;  Location: [facility] END[facility]COPY;  Service: Endoscopy;  Laterality: N/A;   TEE WITHOUT CARDIOVERSION N/A 07/13/2020   Procedure: TRANSESOPHAGEAL ECHOCARDIOGRAM (TEE);  Surgeon: Josue Hector, MD;  Location: [facility] ENDOSCOPY;  Service: Cardiovascular;  Laterality: N/A;   UMBILICAL HERNIA REPAIR N/A 04/27/2018   Procedure: REPAIR UMBILICAL HERNIA ERAS PATHWAY;  Surgeon: Alphonsa Overall, MD;  Location: [facility];  Service: General;  Laterality: N/A;   varicose veins     WRIST SURGERY     FAMILY HISTORY Family History  Problem Relation Age of Onset   Colon cancer Neg Hx    Esophageal cancer Neg Hx    Pancreatic cancer Neg Hx    Prostate cancer Neg Hx    Rectal cancer Neg Hx    Stomach cancer Neg Hx    SOCIAL HISTORY Social History   Tobacco Use   Smoking status: Never   Smokeless tobacco: Never  Vaping Use   Vaping Use: Never used  Substance Use Topics   Alcohol use: Yes    Alcohol/week: 0.0 standard drinks of alcohol    Comment: occassional wine, martini   Drug use: No       OPHTHALMIC EXAM:  Base Eye Exam     Visual Acuity (Snellen - Linear)       Right Left   Dist [place] 20/30 20/100   Dist ph [place] 20/25 -1 20/70 -2         Tonometry (Tonopen, 9:13 AM)       Right Left   Pressure 17 16         Pupils       Dark Light Shape React APD   Right 3 2 Round Brisk None   Left 3 2 Round Brisk None         Visual Fields (Counting fingers)       Left Right    Full Full         Extraocular Movement       Right Left    Full, Ortho Full, Ortho         Neuro/Psych     Oriented x3: Yes    Mood/Affect: Normal         Dilation     Both eyes: 1.0% Mydriacyl, 2.5% Phenylephrine @ 9:13 AM           Slit Lamp and Fundus Exam     External Exam       Right Left   External Normal Normal  Slit Lamp Exam       Right Left   Lids/Lashes Dermatochalasis - upper lid, Meibomian gland dysfunction, mild Ptosis Dermatochalasis - upper lid, Meibomian gland dysfunction, mild Ptosis   Conjunctiva/Sclera White and quiet Mild Conjunctivochalasis inferiorly   Cornea trace PEE, well healed cataract wound well healed cataract wound   Anterior Chamber deep and clear deep and clear   Iris Round and dilated Round and dilated   Lens PC IOL in good position PC IOL in good position, trace Posterior capsular opacification   Anterior Vitreous Vitreous syneresis Normal         Fundus Exam       Right Left   Posterior Vitreous Posterior vitreous detachment Partial PVD   Disc Pink and Sharp, temporal PPA trace Pallor, Sharp rim, mild PPA   C/D Ratio 0.3 0.3   Macula Flat, Good foveal reflex, RPE mottling, No heme or edema, mild ERM Blunted foveal reflex, RPE mottling and clumping, central SRF/edema, focal CNV with central SRF   Vessels attenuated, Tortuous attenuated, Tortuous   Periphery Attached, focal DBH temporal periphery Attached, focal DBH temporal periphery           Refraction     Manifest Refraction       Sphere Cylinder Axis Dist VA   Right +0.50 +0.50 015 20/25   Left [place] Sphere  20/70           IMAGING AND PROCEDURES  Imaging and Procedures for 08/27/2022  OCT, Retina - OU - Both Eyes       Right Eye Quality was good. Scan locations included subfoveal. Central Foveal Thickness: 264. Progression has no prior data. Findings include normal foveal contour, no IRF, no SRF, retinal drusen , epiretinal membrane, macular pucker, pigment epithelial detachment (Mild ERM with early pucker, partial PVD, focal peripapillary PED ST to disc).   Left  Eye Quality was good. Scan locations included subfoveal. Central Foveal Thickness: 333. Progression has no prior data. Findings include no IRF, abnormal foveal contour, retinal drusen , subretinal hyper-reflective material, pigment epithelial detachment, subretinal fluid, vitreomacular adhesion [facility] with focal CNV/SRHM).   Notes *Images captured and stored on drive  Diagnosis / Impression:  OD: NFP; no IRF/SRF; Mild ERM with early pucker, partial PVD, focal peripapillary PED ST to disc OS: Central SRF with focal CNV/SRHM  Clinical management:  See below  Abbreviations: NFP - Normal foveal profile. CME - cystoid macular edema. PED - pigment epithelial detachment. IRF - intraretinal fluid. SRF - subretinal fluid. EZ - ellipsoid zone. ERM - epiretinal membrane. ORA - outer retinal atrophy. ORT - outer retinal tubulation. SRHM - subretinal hyper-reflective material. IRHM - intraretinal hyper-reflective material      Intravitreal Injection, Pharmacologic Agent - OS - Left Eye       Time Out 08/27/2022. 11:04 AM. Confirmed correct patient, procedure, site, and patient consented.   Anesthesia Topical anesthesia was used. Anesthetic medications included Lidocaine 2%, Proparacaine 0.5%.   Procedure Preparation included 5% betadine to ocular surface, eyelid speculum. A (32g) needle was used.   Injection: 1.25 mg Bevacizumab 1.'25mg'$ /0.20m   Route: Intravitreal, Site: Left Eye   NDC: [id number] Lot:: [id number] Expiration date: 10/03/2022   Post-op Post injection exam found visual acuity of at least counting fingers. The patient tolerated the procedure well. There were no complications. The patient received written and verbal post procedure care education.      Fluorescein Angiography Optos (Transit OS)       Right Eye Progression  has no prior data. Early phase findings include staining. Mid/Late phase findings include staining (Focal staining superior to fovea).   Left  Eye Progression has no prior data. Early phase findings include delayed filling, staining. Mid/Late phase findings include staining (Mild central staining, minimal leakage, no smokestack or dot blot pattern).   Notes **Images stored on drive**  Impression: OD: Focal staining superior to fovea OS: Mild central staining, minimal leakage, no smokestack or dot blot pattern            ASSESSMENT/PLAN:    ICD-10-CM   1. Exudative age-related macular degeneration of left eye with active choroidal neovascularization (HCC)  H35.3221 OCT, Retina - OU - Both Eyes    Intravitreal Injection, Pharmacologic Agent - OS - Left Eye    Fluorescein Angiography Optos (Transit OS)    Bevacizumab (AVASTIN) SOLN 1.25 mg    2. 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 Essential hypertension  I10     4. Hypertensive retinopathy of both eyes  H35.033     5. Pseudophakia, both eyes  Z96.1      Exudative age related macular degeneration, OS  - previously followed with Dr. Zadie Rhine  - s/p IVA OS x2, last injection mid December -- ~6 wks  - The incidence pathology and anatomy of wet AMD discussed   - discussed treatment options including observation vs intravitreal anti-VEGF agents such as Avastin, Lucentis, Eylea.    - Risks of endophthalmitis and vascular occlusive events and atrophic changes discussed with patient  - FA (01.24.24) shows Mild central staining, minimal leakage, no smokestack or dot blot pattern  - OCT shows OD: Mild ERM with early pucker, partial PVD, focal peripapillary PED ST to disc; OS: Central SRF with focal CNV/SRHM at ~6 wks  - recommend IVA OS #1 today, 01.24.24 w/ f/u in 4 wks - pt wishes to proceed with injection - RBA of procedure discussed, questions answered - IVA informed consent obtained and signed, 01.24.24 (OS) - see procedure note  - f/u in 4 wks -- DFE/OCT, possible injection  2. Age related macular degeneration,  non-exudative, right eye  - early stage w/ minimal drusen  - The incidence, anatomy, and pathology of dry AMD, risk of progression, and the AREDS and AREDS 2 study including smoking risks discussed with patient.  - Recommend amsler grid monitoring  3,4. Hypertensive retinopathy OU - discussed importance of tight BP control - monitor  5. Pseudophakia OU  - s/p CE/IOL  - IOL in good position, doing well  - monitor  Ophthalmic Meds Ordered this visit:  Meds ordered this encounter  Medications   Bevacizumab (AVASTIN) SOLN 1.25 mg     Return in about 4 weeks (around 09/24/2022) for f/u exu ARMD OS, DFE, OCT.  There are no Patient Instructions on file for this visit.   Explained the diagnoses, plan, and follow up with the patient and they expressed understanding.  Patient expressed understanding of the importance of proper follow up care.   This document serves as a record of services personally performed by Gardiner Sleeper, MD, PhD. It was created on their behalf by Orvan Falconer, an ophthalmic technician. The creation of this record is the provider's dictation and/or activities during the visit.    Electronically signed by: Orvan Falconer, OA, 08/27/22  11:56 AM  This document serves as a record of services personally performed by Gardiner Sleeper, MD, PhD. It was created on  their behalf by San Jetty. Owens Shark, OA an ophthalmic technician. The creation of this record is the provider's dictation and/or activities during the visit.    Electronically signed by: San Jetty. Owens Shark, [place] 01.24.2024 11:56 AM  Gardiner Sleeper, M.D., Ph.D. Diseases & Surgery of the Retina and Vitreous [facility]  I have reviewed the above documentation for accuracy and completeness, and I agree with the above. Gardiner Sleeper, M.D., Ph.D. 08/27/22 11:59 AM   Abbreviations: M myopia (nearsighted); A astigmatism; H hyperopia (farsighted); P presbyopia; Mrx spectacle prescription;  CTL  contact lenses; OD right eye; OS left eye; OU both eyes  XT exotropia; ET esotropia; PEK punctate epithelial keratitis; PEE punctate epithelial erosions; DES dry eye syndrome; MGD meibomian gland dysfunction; ATs artificial tears; PFAT's preservative free artificial tears; [place] nuclear sclerotic cataract; PSC posterior subcapsular cataract; ERM epi-retinal membrane; PVD posterior vitreous detachment; RD retinal detachment; DM diabetes mellitus; DR diabetic retinopathy; NPDR non-proliferative diabetic retinopathy; PDR proliferative diabetic retinopathy; CSME clinically significant macular edema; DME diabetic macular edema; dbh dot blot hemorrhages; CWS cotton wool spot; POAG primary open angle glaucoma; C/D cup-to-disc ratio; HVF humphrey visual field; GVF goldmann visual field; OCT optical coherence tomography; IOP intraocular pressure; BRVO Branch retinal vein occlusion; CRVO central retinal vein occlusion; CRAO central retinal artery occlusion; BRAO branch retinal artery occlusion; RT retinal tear; SB scleral buckle; PPV pars plana vitrectomy; VH Vitreous hemorrhage; PRP panretinal laser photocoagulation; IVK intravitreal kenalog; VMT vitreomacular traction; MH Macular hole;  NVD neovascularization of the disc; NVE neovascularization elsewhere; AREDS age related eye disease study; ARMD age related macular degeneration; POAG primary open angle glaucoma; EBMD epithelial/anterior basement membrane dystrophy; ACIOL anterior chamber intraocular lens; IOL intraocular lens; PCIOL posterior chamber intraocular lens; Phaco/IOL phacoemulsification with intraocular lens placement; [place] photorefractive keratectomy; LASIK laser assisted in situ keratomileusis; HTN hypertension; DM diabetes mellitus; COPD chronic obstructive pulmonary disease

## 2022-08-27 ENCOUNTER — Ambulatory Visit (INDEPENDENT_AMBULATORY_CARE_PROVIDER_SITE_OTHER): Payer: Medicare Other | Admitting: Ophthalmology

## 2022-08-27 ENCOUNTER — Encounter (INDEPENDENT_AMBULATORY_CARE_PROVIDER_SITE_OTHER): Payer: Self-pay | Admitting: Ophthalmology

## 2022-08-27 DIAGNOSIS — I10 Essential (primary) hypertension: Secondary | ICD-10-CM | POA: Diagnosis not present

## 2022-08-27 DIAGNOSIS — H353111 Nonexudative age-related macular degeneration, right eye, early dry stage: Secondary | ICD-10-CM

## 2022-08-27 DIAGNOSIS — H353221 Exudative age-related macular degeneration, left eye, with active choroidal neovascularization: Secondary | ICD-10-CM | POA: Diagnosis not present

## 2022-08-27 DIAGNOSIS — Z961 Presence of intraocular lens: Secondary | ICD-10-CM

## 2022-08-27 DIAGNOSIS — H353131 Nonexudative age-related macular degeneration, bilateral, early dry stage: Secondary | ICD-10-CM

## 2022-08-27 DIAGNOSIS — H43822 Vitreomacular adhesion, left eye: Secondary | ICD-10-CM

## 2022-08-27 DIAGNOSIS — H35033 Hypertensive retinopathy, bilateral: Secondary | ICD-10-CM | POA: Diagnosis not present

## 2022-08-27 DIAGNOSIS — H43811 Vitreous degeneration, right eye: Secondary | ICD-10-CM

## 2022-08-27 MED ORDER — BEVACIZUMAB CHEMO INJECTION 1.25MG/0.05ML SYRINGE FOR KALEIDOSCOPE
1.2500 mg | INTRAVITREAL | Status: AC | PRN
  Administered 2022-08-27: 1.25 mg via INTRAVITREAL

## 2022-09-19 NOTE — Progress Notes (Signed)
[facility] Note  09/24/2022     CHIEF COMPLAINT Patient presents for Retina Follow Up   HISTORY OF PRESENT ILLNESS: Shane Diaz is a 77 y.o. male who presents to the clinic today for:   HPI     Retina Follow Up   Patient presents with  Wet AMD.  In left eye.  This started 4 weeks ago.  I, the attending physician,  performed the HPI with the patient and updated documentation appropriately.        Comments   Patient here for 4 weeks retina follow up for exu ARMD OS. Patient states vision doing so,so. No eye pain. Too many tears daily.      Last edited by Bernarda Caffey, MD on 09/24/2022  5:32 PM.    Pt states no change in vision since last injection, he feels like his eyes have been watering a lot  Referring physician: [name], MD [id number],  [place] [address]  HISTORICAL INFORMATION:   Selected notes from the MEDICAL RECORD NUMBER Dr. Zadie Rhine pt -- transferring care due to insurance LEE:  Ocular Hx- PMH-    CURRENT MEDICATIONS: No current outpatient medications on file. (Ophthalmic Drugs)   No current facility-administered medications for this visit. (Ophthalmic Drugs)   Current Outpatient Medications (Other)  Medication Sig   amiodarone (PACERONE) 200 MG tablet TAKE ONE TABLET BY MOUTH DAILY   apixaban (ELIQUIS) 5 MG TABS tablet Take 1 tablet (5 mg total) by mouth 2 (two) times daily.   bumetanide (BUMEX) 2 MG tablet Take 2 tablets (4 mg total) by mouth 2 (two) times daily.   eplerenone (INSPRA) 25 MG tablet Take 1 tablet (25 mg total) by mouth daily.   metoprolol succinate (TOPROL-XL) 50 MG 24 hr tablet TAKE ONE TABLET BY MOUTH DAILY WITH OR IMMEDIATELY FOLLOWING A MEAL   doxycycline (VIBRAMYCIN) 100 MG capsule Take 100 mg by mouth 2 (two) times daily. (Patient not taking: Reported on 07/02/2022)   Magnesium Oxide 400 MG CAPS Take 1 capsule (400 mg total) by mouth daily. (Patient not taking: Reported on 07/02/2022)    nitroGLYCERIN (NITROSTAT) 0.4 MG SL tablet Place 1 tablet (0.4 mg total) under the tongue every 5 (five) minutes x 3 doses as needed for chest pain. (Patient not taking: Reported on 07/02/2022)   Potassium Chloride ER 20 MEQ TBCR Take 20 mEq by mouth 2 (two) times daily. (Patient not taking: Reported on 07/02/2022)   No current facility-administered medications for this visit. (Other)   REVIEW OF SYSTEMS: ROS   Positive for: Eyes Last edited by Theodore Demark, COA on 09/24/2022  9:57 AM.      ALLERGIES Allergies  Allergen Reactions   Diltiazem     rash   Amiodarone Rash   PAST MEDICAL HISTORY Past Medical History:  Diagnosis Date   Acute diastolic heart failure ([place]) 08/03/2020   Arthritis    Diverticulosis 05/2015   Mild, noted on colonosocpy   Fatty liver 05/14/2015   noted on [place] ABD   History of colon polyps 05/2014   sessile in ascending colon, pedunculated in sigmoid colon, diverticulosis   Hyperlipidemia    pt denies   Hypertension    not currently taking medications per MD   OSA (obstructive sleep apnea) 08/03/2020   Renal cyst 05/2014   Small, Left, noted on [place] ABD   Umbilical hernia    Varicose veins    Past Surgical History:  Procedure Laterality Date  APPENDECTOMY     childhood   BIOPSY  07/11/2020   Procedure: BIOPSY;  Surgeon: Ladene Artist, MD;  Location: [place];  Service: Endoscopy;;   CARDIOVERSION N/A 07/13/2020   Procedure: CARDIOVERSION;  Surgeon: Josue Hector, MD;  Location: [facility] ENDOSCOPY;  Service: Cardiovascular;  Laterality: N/A;   CARDIOVERSION N/A 08/02/2020   Procedure: CARDIOVERSION;  Surgeon: Werner Lean, MD;  Location: [facility] ENDOSCOPY;  Service: Cardiovascular;  Laterality: N/A;   CARDIOVERSION N/A 12/12/2021   Procedure: CARDIOVERSION;  Surgeon: Fay Records, MD;  Location: [place];  Service: Cardiovascular;  Laterality: N/A;   COLONOSCOPY W/ POLYPECTOMY  05/23/2014   COLONOSCOPY WITH PROPOFOL N/A 07/11/2020    Procedure: COLONOSCOPY WITH PROPOFOL;  Surgeon: Ladene Artist, MD;  Location: [place];  Service: Endoscopy;  Laterality: N/A;   ESOPHAGOGASTRODUODENOSCOPY (EGD) WITH PROPOFOL N/A 07/11/2020   Procedure: ESOPHAGOGASTRODUODENOSCOPY (EGD) WITH PROPOFOL;  Surgeon: Ladene Artist, MD;  Location: [facility] ENDOSCOPY;  Service: Endoscopy;  Laterality: N/A;   TEE WITHOUT CARDIOVERSION N/A 07/13/2020   Procedure: TRANSESOPHAGEAL ECHOCARDIOGRAM (TEE);  Surgeon: Josue Hector, MD;  Location: [facility] ENDOSCOPY;  Service: Cardiovascular;  Laterality: N/A;   UMBILICAL HERNIA REPAIR N/A 04/27/2018   Procedure: REPAIR UMBILICAL HERNIA ERAS PATHWAY;  Surgeon: Alphonsa Overall, MD;  Location: [facility];  Service: General;  Laterality: N/A;   varicose veins     WRIST SURGERY     FAMILY HISTORY Family History  Problem Relation Age of Onset   Colon cancer Neg Hx    Esophageal cancer Neg Hx    Pancreatic cancer Neg Hx    Prostate cancer Neg Hx    Rectal cancer Neg Hx    Stomach cancer Neg Hx    SOCIAL HISTORY Social History   Tobacco Use   Smoking status: Never   Smokeless tobacco: Never  Vaping Use   Vaping Use: Never used  Substance Use Topics   Alcohol use: Yes    Alcohol/week: 0.0 standard drinks of alcohol    Comment: occassional wine, martini   Drug use: No       OPHTHALMIC EXAM:  Base Eye Exam     Visual Acuity (Snellen - Linear)       Right Left   Dist [place] 20/30 20/100 -1   Dist ph [place] 20/25 -2 20/70         Tonometry (Tonopen, 9:55 AM)       Right Left   Pressure 12 15         Pupils       Dark Light Shape React APD   Right 3 2 Round Brisk None   Left 3 2 Round Brisk None         Visual Fields (Counting fingers)       Left Right    Full Full         Extraocular Movement       Right Left    Full, Ortho Full, Ortho         Neuro/Psych     Oriented x3: Yes   Mood/Affect: Normal         Dilation     Both eyes: 1.0% Mydriacyl, 2.5% Phenylephrine @  9:55 AM           Slit Lamp and Fundus Exam     External Exam       Right Left   External Normal Normal         Slit Lamp Exam  Right Left   Lids/Lashes Dermatochalasis - upper lid, Meibomian gland dysfunction, mild Ptosis Dermatochalasis - upper lid, Meibomian gland dysfunction, mild Ptosis   Conjunctiva/Sclera White and quiet Mild Conjunctivochalasis inferiorly   Cornea trace PEE, well healed cataract wound well healed cataract wound   Anterior Chamber deep and clear deep and clear   Iris Round and dilated Round and dilated   Lens PC IOL in good position PC IOL in good position, trace Posterior capsular opacification   Anterior Vitreous Vitreous syneresis Normal         Fundus Exam       Right Left   Posterior Vitreous Posterior vitreous detachment Partial PVD   Disc Pink and Sharp, +PPA trace Pallor, Sharp rim, mild PPA   C/D Ratio 0.3 0.3   Macula Flat, Good foveal reflex, RPE mottling, No heme or edema, mild ERM Blunted foveal reflex, RPE mottling and clumping, central SRF/edema, focal CNV with central SRF -- persistent   Vessels mild attenuation, mild tortuosity attenuated, Tortuous   Periphery Attached, focal DBH temporal periphery, mild reticular degeneration Attached, focal DBH temporal periphery           IMAGING AND PROCEDURES  Imaging and Procedures for 09/24/2022  OCT, Retina - OU - Both Eyes       Right Eye Quality was good. Scan locations included subfoveal. Central Foveal Thickness: 268. Progression has been stable. Findings include normal foveal contour, no IRF, no SRF, retinal drusen , epiretinal membrane, macular pucker, pigment epithelial detachment (Mild ERM with early pucker, partial PVD, focal peripapillary PED ST to disc).   Left Eye Quality was good. Scan locations included subfoveal. Central Foveal Thickness: 324. Progression has been stable. Findings include no IRF, abnormal foveal contour, retinal drusen , subretinal  hyper-reflective material, pigment epithelial detachment, subretinal fluid, vitreomacular adhesion (Persistent central SRF with focal CNV/SRHM -- minimal change from prior, partial PVD).   Notes *Images captured and stored on drive  Diagnosis / Impression:  OD: NFP; no IRF/SRF; Mild ERM with early pucker, partial PVD, focal peripapillary PED ST to disc OS: Persistent central SRF with focal CNV/SRHM -- minimal change from prior, partial PVD  Clinical management:  See below  Abbreviations: NFP - Normal foveal profile. CME - cystoid macular edema. PED - pigment epithelial detachment. IRF - intraretinal fluid. SRF - subretinal fluid. EZ - ellipsoid zone. ERM - epiretinal membrane. ORA - outer retinal atrophy. ORT - outer retinal tubulation. SRHM - subretinal hyper-reflective material. IRHM - intraretinal hyper-reflective material      Intravitreal Injection, Pharmacologic Agent - OS - Left Eye       Time Out 09/24/2022. 11:01 AM. Confirmed correct patient, procedure, site, and patient consented.   Anesthesia Topical anesthesia was used. Anesthetic medications included Lidocaine 2%, Proparacaine 0.5%.   Procedure Preparation included 5% betadine to ocular surface, eyelid speculum. A supplied (32g) needle was used.   Injection: 1.25 mg Bevacizumab 1.54m/0.05ml   Route: Intravitreal, Site: Left Eye   NDC:[id number] Lot:[id number] Expiration date: 11/15/2022   Post-op Post injection exam found visual acuity of at least counting fingers. The patient tolerated the procedure well. There were no complications. The patient received written and verbal post procedure care education. Post injection medications were not given.            ASSESSMENT/PLAN:    ICD-10-CM   1. Exudative age-related macular degeneration of left eye with active choroidal neovascularization (HCC)  H35.3221 OCT, Retina - OU - Both Eyes  Intravitreal Injection, Pharmacologic Agent - OS - Left Eye     Bevacizumab (AVASTIN) SOLN 1.25 mg    2. 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 Essential hypertension  I10     4. Hypertensive retinopathy of both eyes  H35.033     5. Pseudophakia, both eyes  Z96.1       Exudative age related macular degeneration, OS  - previously followed with Dr. Zadie Rhine  - s/p IVA OS x2, last injection mid December -- ~6 wks  - s/p IVA OS #3 (01.24.24) - FA (01.24.24) shows Mild central staining, minimal leakage, no smokestack or dot blot pattern  - OCT shows OS: Persistent central SRF with focal CNV/SRHM -- minimal change from prior, partial PVD at 4 weeks  - recommend IVA OS #4 today, 02.21.24 w/ f/u in 4 wks - pt wishes to proceed with injection - RBA of procedure discussed, questions answered - IVA informed consent obtained and signed, 01.24.24 (OS) - see procedure note  - will also start eplerenone 22m once a day for possible CSCR component  - f/u in 4 wks -- DFE/OCT, possible injection  2. Age related macular degeneration, non-exudative, right eye  - early stage w/ minimal drusen  - The incidence, anatomy, and pathology of dry AMD, risk of progression, and the AREDS and AREDS 2 study including smoking risks discussed with patient.  - Recommend amsler grid monitoring  3,4. Hypertensive retinopathy OU - discussed importance of tight BP control - monitor  5. Pseudophakia OU  - s/p CE/IOL  - IOL in good position, doing well  - monitor  Ophthalmic Meds Ordered this visit:  Meds ordered this encounter  Medications   eplerenone (INSPRA) 25 MG tablet    Sig: Take 1 tablet (25 mg total) by mouth daily.    Dispense:  30 tablet    Refill:  3   Bevacizumab (AVASTIN) SOLN 1.25 mg     Return in about 4 weeks (around 10/22/2022) for f/u exu ARMD OS, DFE, OCT.  There are no Patient Instructions on file for this visit.   Explained the diagnoses, plan, and follow up with the patient and they expressed  understanding.  Patient expressed understanding of the importance of proper follow up care.   This document serves as a record of services personally performed by BGardiner Sleeper MD, PhD. It was created on their behalf by MOrvan Falconer an ophthalmic technician. The creation of this record is the provider's dictation and/or activities during the visit.    Electronically signed by: MOrvan Falconer OA, 09/25/22  2:44 PM  This document serves as a record of services personally performed by BGardiner Sleeper MD, PhD. It was created on their behalf by ASan Jetty BOwens Shark OA an ophthalmic technician. The creation of this record is the provider's dictation and/or activities during the visit.    Electronically signed by: ASan Jetty BOwens Shark O[place]02.21.2024 2:44 PM  BGardiner Sleeper M.D., Ph.D. Diseases & Surgery of the Retina and Vitreous [facility] I have reviewed the above documentation for accuracy and completeness, and I agree with the above. BGardiner Sleeper M.D., Ph.D. 09/25/22 2:45 PM   Abbreviations: M myopia (nearsighted); A astigmatism; H hyperopia (farsighted); P presbyopia; Mrx spectacle prescription;  CTL contact lenses; OD right eye; OS left eye; OU both eyes  XT exotropia; ET esotropia; PEK punctate epithelial keratitis; PEE punctate epithelial erosions; DES dry  eye syndrome; MGD meibomian gland dysfunction; ATs artificial tears; PFAT's preservative free artificial tears; [place] nuclear sclerotic cataract; PSC posterior subcapsular cataract; ERM epi-retinal membrane; PVD posterior vitreous detachment; RD retinal detachment; DM diabetes mellitus; DR diabetic retinopathy; NPDR non-proliferative diabetic retinopathy; PDR proliferative diabetic retinopathy; CSME clinically significant macular edema; DME diabetic macular edema; dbh dot blot hemorrhages; CWS cotton wool spot; POAG primary open angle glaucoma; C/D cup-to-disc ratio; HVF humphrey visual field; GVF goldmann visual  field; OCT optical coherence tomography; IOP intraocular pressure; BRVO Branch retinal vein occlusion; CRVO central retinal vein occlusion; CRAO central retinal artery occlusion; BRAO branch retinal artery occlusion; RT retinal tear; SB scleral buckle; PPV pars plana vitrectomy; VH Vitreous hemorrhage; PRP panretinal laser photocoagulation; IVK intravitreal kenalog; VMT vitreomacular traction; MH Macular hole;  NVD neovascularization of the disc; NVE neovascularization elsewhere; AREDS age related eye disease study; ARMD age related macular degeneration; POAG primary open angle glaucoma; EBMD epithelial/anterior basement membrane dystrophy; ACIOL anterior chamber intraocular lens; IOL intraocular lens; PCIOL posterior chamber intraocular lens; Phaco/IOL phacoemulsification with intraocular lens placement; [place] photorefractive keratectomy; LASIK laser assisted in situ keratomileusis; HTN hypertension; DM diabetes mellitus; COPD chronic obstructive pulmonary disease

## 2022-09-24 ENCOUNTER — Encounter (INDEPENDENT_AMBULATORY_CARE_PROVIDER_SITE_OTHER): Payer: Self-pay | Admitting: Ophthalmology

## 2022-09-24 ENCOUNTER — Ambulatory Visit (INDEPENDENT_AMBULATORY_CARE_PROVIDER_SITE_OTHER): Payer: Medicare Other | Admitting: Ophthalmology

## 2022-09-24 DIAGNOSIS — H353111 Nonexudative age-related macular degeneration, right eye, early dry stage: Secondary | ICD-10-CM | POA: Diagnosis not present

## 2022-09-24 DIAGNOSIS — H35033 Hypertensive retinopathy, bilateral: Secondary | ICD-10-CM | POA: Diagnosis not present

## 2022-09-24 DIAGNOSIS — I10 Essential (primary) hypertension: Secondary | ICD-10-CM | POA: Diagnosis not present

## 2022-09-24 DIAGNOSIS — H353221 Exudative age-related macular degeneration, left eye, with active choroidal neovascularization: Secondary | ICD-10-CM

## 2022-09-24 DIAGNOSIS — Z961 Presence of intraocular lens: Secondary | ICD-10-CM

## 2022-09-24 MED ORDER — BEVACIZUMAB CHEMO INJECTION 1.25MG/0.05ML SYRINGE FOR KALEIDOSCOPE
1.2500 mg | INTRAVITREAL | Status: AC | PRN
  Administered 2022-09-24: 1.25 mg via INTRAVITREAL

## 2022-09-24 MED ORDER — EPLERENONE 25 MG PO TABS: 25.0000 mg | ORAL_TABLET | Freq: Every day | ORAL | 3 refills | Status: DC

## 2022-09-29 ENCOUNTER — Ambulatory Visit: Payer: Medicare Other | Attending: Cardiovascular Disease

## 2022-09-29 DIAGNOSIS — G4733 Obstructive sleep apnea (adult) (pediatric): Secondary | ICD-10-CM

## 2022-09-29 DIAGNOSIS — R0683 Snoring: Secondary | ICD-10-CM

## 2022-10-15 NOTE — Progress Notes (Signed)
[facility] Note  10/28/2022     CHIEF COMPLAINT Patient presents for Retina Follow Up   HISTORY OF PRESENT ILLNESS: Shane Diaz is a 77 y.o. male who presents to the clinic today for:   HPI     Retina Follow Up   Patient presents with  Wet AMD.  In left eye.  This started months ago.  Duration of 4 weeks.  I, the attending physician,  performed the HPI with the patient and updated documentation appropriately.        Comments   Patient feels that the vision is worse. He using AT's/Eyewash OU PRN.      Last edited by Bernarda Caffey, MD on 10/28/2022  1:06 PM.     Pt states   Referring physician: [name], MD [address],  [place] [address]  HISTORICAL INFORMATION:   Selected notes from the MEDICAL RECORD NUMBER Dr. Zadie Rhine pt -- transferring care due to insurance LEE:  Ocular Hx- PMH-    CURRENT MEDICATIONS: No current outpatient medications on file. (Ophthalmic Drugs)   No current facility-administered medications for this visit. (Ophthalmic Drugs)   Current Outpatient Medications (Other)  Medication Sig   amiodarone (PACERONE) 200 MG tablet TAKE ONE TABLET BY MOUTH DAILY   apixaban (ELIQUIS) 5 MG TABS tablet Take 1 tablet (5 mg total) by mouth 2 (two) times daily.   apixaban (ELIQUIS) 5 MG TABS tablet Take 1 tablet (5 mg total) by mouth 2 (two) times daily.   bumetanide (BUMEX) 2 MG tablet Take 2 tablets (4 mg total) by mouth 2 (two) times daily.   doxycycline (VIBRAMYCIN) 100 MG capsule Take 100 mg by mouth 2 (two) times daily.   eplerenone (INSPRA) 25 MG tablet Take 1 tablet (25 mg total) by mouth daily.   Magnesium Oxide 400 MG CAPS Take 1 capsule (400 mg total) by mouth daily.   metoprolol succinate (TOPROL-XL) 50 MG 24 hr tablet TAKE ONE TABLET BY MOUTH DAILY WITH OR IMMEDIATELY FOLLOWING A MEAL   nitroGLYCERIN (NITROSTAT) 0.4 MG SL tablet Place 1 tablet (0.4 mg total) under the tongue every 5 (five) minutes x 3  doses as needed for chest pain. (Patient not taking: Reported on 10/23/2022)   Potassium Chloride ER 20 MEQ TBCR Take 20 mEq by mouth 2 (two) times daily.   No current facility-administered medications for this visit. (Other)   REVIEW OF SYSTEMS: ROS   Positive for: Cardiovascular, Eyes, Respiratory Negative for: Constitutional, Gastrointestinal, Neurological, Skin, Genitourinary, Musculoskeletal, HENT, Endocrine, Psychiatric, Allergic/Imm, Heme/Lymph Last edited by Annie Paras, COT on 10/28/2022 10:14 AM.       ALLERGIES Allergies  Allergen Reactions   Diltiazem     rash   Amiodarone Rash   PAST MEDICAL HISTORY Past Medical History:  Diagnosis Date   Acute diastolic heart failure ([place]) 08/03/2020   Arthritis    Diverticulosis 05/2015   Mild, noted on colonosocpy   Fatty liver 05/14/2015   noted on [place] ABD   History of colon polyps 05/2014   sessile in ascending colon, pedunculated in sigmoid colon, diverticulosis   Hyperlipidemia    pt denies   Hypertension    not currently taking medications per MD   OSA (obstructive sleep apnea) 08/03/2020   Renal cyst 05/2014   Small, Left, noted on [place] ABD   Umbilical hernia    Varicose veins    Past Surgical History:  Procedure Laterality Date   APPENDECTOMY  childhood   BIOPSY  07/11/2020   Procedure: BIOPSY;  Surgeon: Ladene Artist, MD;  Location: [facility];  Service: Endoscopy;;   CARDIOVERSION N/A 07/13/2020   Procedure: CARDIOVERSION;  Surgeon: Josue Hector, MD;  Location[facility] ENDOSCOPY;  Service: Cardiovascular;  Laterality: N/A;   CARDIOVERSION N/A 08/02/2020   Procedure: CARDIOVERSION;  Surgeon: Werner Lean, MD;  Location: [facility] ENDOSCOPY;  Service: Cardiovascular;  Laterality: N/A;   CARDIOVERSION N/A 12/12/2021   Procedure: CARDIOVERSION;  Surgeon: Fay Records, MD;  Location: [place];  Service: Cardiovascular;  Laterality: N/A;   COLONOSCOPY W/ POLYPECTOMY  05/23/2014   COLONOSCOPY  WITH PROPOFOL N/A 07/11/2020   Procedure: COLONOSCOPY WITH PROPOFOL;  Surgeon: Ladene Artist, MD;  Location: [place];  Service: Endoscopy;  Laterality: N/A;   ESOPHAGOGASTRODUODENOSCOPY (EGD) WITH PROPOFOL N/A 07/11/2020   Procedure: ESOPHAGOGASTRODUODENOSCOPY (EGD) WITH PROPOFOL;  Surgeon: Ladene Artist, MD;  Location: [facility] ENDOSCOPY;  Service: Endoscopy;  Laterality: N/A;   TEE WITHOUT CARDIOVERSION N/A 07/13/2020   Procedure: TRANSESOPHAGEAL ECHOCARDIOGRAM (TEE);  Surgeon: Josue Hector, MD;  Location: [facility] ENDOSCOPY;  Service: Cardiovascular;  Laterality: N/A;   UMBILICAL HERNIA REPAIR N/A 04/27/2018   Procedure: REPAIR UMBILICAL HERNIA ERAS PATHWAY;  Surgeon: Alphonsa Overall, MD;  Location: [facility];  Service: General;  Laterality: N/A;   varicose veins     WRIST SURGERY     FAMILY HISTORY Family History  Problem Relation Age of Onset   Colon cancer Neg Hx    Esophageal cancer Neg Hx    Pancreatic cancer Neg Hx    Prostate cancer Neg Hx    Rectal cancer Neg Hx    Stomach cancer Neg Hx    SOCIAL HISTORY Social History   Tobacco Use   Smoking status: Never   Smokeless tobacco: Never  Vaping Use   Vaping Use: Never used  Substance Use Topics   Alcohol use: Yes    Alcohol/week: 0.0 standard drinks of alcohol    Comment: occassional wine, martini   Drug use: No       OPHTHALMIC EXAM:  Base Eye Exam     Visual Acuity (Snellen - Linear)       Right Left   Dist [place] 20/70 -2 20/150   Dist ph [place] 20/40 20/70         Tonometry (Tonopen, 10:24 AM)       Right Left   Pressure 19 15         Pupils       Dark Light Shape React APD   Right 3 2 Round Brisk None   Left 3 2 Round Brisk None         Visual Fields       Left Right    Full Full         Extraocular Movement       Right Left    Full, Ortho Full, Ortho         Neuro/Psych     Oriented x3: Yes   Mood/Affect: Normal         Dilation     Both eyes: 1.0% Mydriacyl, 2.5%  Phenylephrine @ 10:15 AM           Slit Lamp and Fundus Exam     External Exam       Right Left   External Normal Normal         Slit Lamp Exam       Right Left  Lids/Lashes Dermatochalasis - upper lid, Meibomian gland dysfunction, mild Ptosis Dermatochalasis - upper lid, Meibomian gland dysfunction, mild Ptosis   Conjunctiva/Sclera White and quiet Mild Conjunctivochalasis inferiorly   Cornea trace PEE, well healed cataract wound well healed cataract wound   Anterior Chamber deep and clear deep and clear   Iris Round and dilated Round and dilated   Lens PC IOL in good position PC IOL in good position, trace Posterior capsular opacification   Anterior Vitreous Vitreous syneresis Normal         Fundus Exam       Right Left   Posterior Vitreous Posterior vitreous detachment Partial PVD   Disc Pink and Sharp, +PPA trace Pallor, Sharp rim, mild PPA   C/D Ratio 0.3 0.3   Macula Flat, Good foveal reflex, RPE mottling, No heme or edema, mild ERM Blunted foveal reflex, RPE mottling and clumping, focal CNV with central SRF -- slightly increased   Vessels attenuated, mild tortuosity attenuated, Tortuous   Periphery Attached, focal DBH temporal periphery, mild reticular degeneration Attached, focal DBH temporal periphery           Refraction     Manifest Refraction       Sphere Cylinder Axis Dist VA   Right -0.50 +0.25 015 20/40   Left -0.75 Sphere  20/70           IMAGING AND PROCEDURES  Imaging and Procedures for 10/28/2022  OCT, Retina - OU - Both Eyes       Right Eye Quality was good. Scan locations included subfoveal. Central Foveal Thickness: 293. Progression has worsened. Findings include no IRF, no SRF, abnormal foveal contour, retinal drusen , epiretinal membrane, macular pucker, pigment epithelial detachment (Mild progression of ERM with blunting of foveal contour, early pucker, partial PVD, focal peripapillary PED ST to disc).   Left Eye Quality was  good. Scan locations included subfoveal. Central Foveal Thickness: 332. Progression has worsened. Findings include no IRF, abnormal foveal contour, retinal drusen , subretinal hyper-reflective material, pigment epithelial detachment, subretinal fluid, vitreomacular adhesion (Mild interval increase in SRF with focal CNV/SRHM, partial PVD).   Notes *Images captured and stored on drive  Diagnosis / Impression:  OD: NFP; no IRF/SRF; Mild progression of ERM with blunting of foveal contour, early pucker, partial PVD, focal peripapillary PED ST to disc OS: Mild interval increase in SRF with focal CNV/SRHM, partial PVD  Clinical management:  See below  Abbreviations: NFP - Normal foveal profile. CME - cystoid macular edema. PED - pigment epithelial detachment. IRF - intraretinal fluid. SRF - subretinal fluid. EZ - ellipsoid zone. ERM - epiretinal membrane. ORA - outer retinal atrophy. ORT - outer retinal tubulation. SRHM - subretinal hyper-reflective material. IRHM - intraretinal hyper-reflective material      Intravitreal Injection, Pharmacologic Agent - OS - Left Eye       Time Out 10/28/2022. 11:26 AM. Confirmed correct patient, procedure, site, and patient consented.   Anesthesia Topical anesthesia was used. Anesthetic medications included Lidocaine 2%, Proparacaine 0.5%.   Procedure Preparation included 5% betadine to ocular surface, eyelid speculum. A (32g) needle was used.   Injection: 2 mg aflibercept 2 MG/0.05ML   Route: Intravitreal, Site: Left Eye   NDC: [id number], Lot: [id number], Expiration date: 12/02/2023, Waste: 0 mL   Post-op Post injection exam found visual acuity of at least counting fingers. The patient tolerated the procedure well. There were no complications. The patient received written and verbal post procedure care education. Post injection medications were not given.  ASSESSMENT/PLAN:    ICD-10-CM   1. Exudative age-related macular  degeneration of left eye with active choroidal neovascularization (HCC)  H35.3221 OCT, Retina - OU - Both Eyes    Intravitreal Injection, Pharmacologic Agent - OS - Left Eye    aflibercept (EYLEA) SOLN 2 mg    2. 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 Essential hypertension  I10     4. Hypertensive retinopathy of both eyes  H35.033     5. Pseudophakia, both eyes  Z96.1      Exudative age related macular degeneration, OS  - previously followed with Dr. Zadie Rhine  - s/p IVA OS x2, last injection mid December -- ~6 wks  - s/p IVA OS #3 (01.24.24), #4 (02.21.24) - FA (01.24.24) shows Mild central staining, minimal leakage, no smokestack or dot blot pattern  - OCT shows OS: Mild interval increase in SRF with focal CNV/SRHM -- minimal change from prior, partial PVD  - discussed IVA resistance and potential benefit of switching medication  - recommend switching to IVE OS #1 today, 03.26.24 w/ f/u in 4 wks - pt wishes to proceed with injection - RBA of procedure discussed, questions answered - IVA informed consent obtained and signed, 01.24.24 (OS) - IVE informed consent obtained and signed, 03.26.24 - see procedure note  - cont eplerenone 25mg  once a day for possible CSCR component -- pt has medication but hasn't started yet  - f/u in 4 wks -- DFE/OCT, possible injection  2. Age related macular degeneration, non-exudative, right eye  - early stage w/ minimal drusen  - The incidence, anatomy, and pathology of dry AMD, risk of progression, and the AREDS and AREDS 2 study including smoking risks discussed with patient.  - Recommend amsler grid monitoring  3,4. Hypertensive retinopathy OU - discussed importance of tight BP control - monitor  5. Pseudophakia OU  - s/p CE/IOL  - IOL in good position, doing well  - monitor  Ophthalmic Meds Ordered this visit:  Meds ordered this encounter  Medications   aflibercept (EYLEA) SOLN 2 mg      Return in about 4 weeks (around 11/25/2022) for f/u exu ARMD OS, DFE, OCT.  There are no Patient Instructions on file for this visit.   Explained the diagnoses, plan, and follow up with the patient and they expressed understanding.  Patient expressed understanding of the importance of proper follow up care.   This document serves as a record of services personally performed by Gardiner Sleeper, MD, PhD. It was created on their behalf by Renaldo Reel, [place] an ophthalmic technician. The creation of this record is the provider's dictation and/or activities during the visit.    Electronically signed by:  Renaldo Reel, COT  03.13.24 1:23 AM  This document serves as a record of services personally performed by Gardiner Sleeper, MD, PhD. It was created on their behalf by San Jetty. Owens Shark, OA an ophthalmic technician. The creation of this record is the provider's dictation and/or activities during the visit.    Electronically signed by: San Jetty. Owens Shark, [place] 03.26.2024 1:23 AM  Gardiner Sleeper, M.D., Ph.D. Diseases & Surgery of the Retina and Vitreous [facility]  I have reviewed the above documentation for accuracy and completeness, and I agree with the above. Gardiner Sleeper, M.D., Ph.D. 10/30/22 1:25 AM   Abbreviations: M myopia (nearsighted); A astigmatism; H hyperopia (farsighted); P presbyopia; Mrx spectacle prescription;  CTL contact lenses; OD right eye; OS left eye; OU both eyes  XT exotropia; ET esotropia; PEK punctate epithelial keratitis; PEE punctate epithelial erosions; DES dry eye syndrome; MGD meibomian gland dysfunction; ATs artificial tears; PFAT's preservative free artificial tears; [place] nuclear sclerotic cataract; PSC posterior subcapsular cataract; ERM epi-retinal membrane; PVD posterior vitreous detachment; RD retinal detachment; DM diabetes mellitus; DR diabetic retinopathy; NPDR non-proliferative diabetic retinopathy; PDR proliferative diabetic  retinopathy; CSME clinically significant macular edema; DME diabetic macular edema; dbh dot blot hemorrhages; CWS cotton wool spot; POAG primary open angle glaucoma; C/D cup-to-disc ratio; HVF humphrey visual field; GVF goldmann visual field; OCT optical coherence tomography; IOP intraocular pressure; BRVO Branch retinal vein occlusion; CRVO central retinal vein occlusion; CRAO central retinal artery occlusion; BRAO branch retinal artery occlusion; RT retinal tear; SB scleral buckle; PPV pars plana vitrectomy; VH Vitreous hemorrhage; PRP panretinal laser photocoagulation; IVK intravitreal kenalog; VMT vitreomacular traction; MH Macular hole;  NVD neovascularization of the disc; NVE neovascularization elsewhere; AREDS age related eye disease study; ARMD age related macular degeneration; POAG primary open angle glaucoma; EBMD epithelial/anterior basement membrane dystrophy; ACIOL anterior chamber intraocular lens; IOL intraocular lens; PCIOL posterior chamber intraocular lens; Phaco/IOL phacoemulsification with intraocular lens placement; [place] photorefractive keratectomy; LASIK laser assisted in situ keratomileusis; HTN hypertension; DM diabetes mellitus; COPD chronic obstructive pulmonary disease

## 2022-10-17 ENCOUNTER — Ambulatory Visit: Payer: Medicare Other | Admitting: Cardiovascular Disease

## 2022-10-23 ENCOUNTER — Ambulatory Visit: Payer: Medicare Other | Attending: Cardiovascular Disease | Admitting: Cardiovascular Disease

## 2022-10-23 ENCOUNTER — Encounter: Payer: Self-pay | Admitting: Cardiovascular Disease

## 2022-10-23 VITALS — BP 107/58 | HR 61 | Ht 67.0 in | Wt 328.8 lb

## 2022-10-23 DIAGNOSIS — E66813 Obesity, class 3: Secondary | ICD-10-CM

## 2022-10-23 DIAGNOSIS — I4891 Unspecified atrial fibrillation: Secondary | ICD-10-CM

## 2022-10-23 DIAGNOSIS — I483 Typical atrial flutter: Secondary | ICD-10-CM

## 2022-10-23 DIAGNOSIS — D6869 Other thrombophilia: Secondary | ICD-10-CM | POA: Diagnosis not present

## 2022-10-23 DIAGNOSIS — I5032 Chronic diastolic (congestive) heart failure: Secondary | ICD-10-CM | POA: Diagnosis not present

## 2022-10-23 DIAGNOSIS — E662 Morbid (severe) obesity with alveolar hypoventilation: Secondary | ICD-10-CM

## 2022-10-23 DIAGNOSIS — Z5181 Encounter for therapeutic drug level monitoring: Secondary | ICD-10-CM

## 2022-10-23 DIAGNOSIS — I890 Lymphedema, not elsewhere classified: Secondary | ICD-10-CM

## 2022-10-23 DIAGNOSIS — G4733 Obstructive sleep apnea (adult) (pediatric): Secondary | ICD-10-CM

## 2022-10-23 DIAGNOSIS — I82541 Chronic embolism and thrombosis of right tibial vein: Secondary | ICD-10-CM

## 2022-10-23 DIAGNOSIS — I872 Venous insufficiency (chronic) (peripheral): Secondary | ICD-10-CM

## 2022-10-23 DIAGNOSIS — G4730 Sleep apnea, unspecified: Secondary | ICD-10-CM

## 2022-10-23 DIAGNOSIS — Z79899 Other long term (current) drug therapy: Secondary | ICD-10-CM

## 2022-10-23 DIAGNOSIS — R7303 Prediabetes: Secondary | ICD-10-CM

## 2022-10-23 DIAGNOSIS — R0683 Snoring: Secondary | ICD-10-CM

## 2022-10-23 MED ORDER — APIXABAN 5 MG PO TABS: 5.0000 mg | ORAL_TABLET | Freq: Two times a day (BID) | ORAL | 3 refills | Status: DC

## 2022-10-23 MED ORDER — APIXABAN 5 MG PO TABS: 5.0000 mg | ORAL_TABLET | Freq: Two times a day (BID) | ORAL | 0 refills | Status: DC

## 2022-10-23 NOTE — Progress Notes (Signed)
Pt would like an order for a portable oxygen compressor and   Lymphedema device. Dr Sallyanne Kuster aware

## 2022-10-23 NOTE — Progress Notes (Addendum)
Cardiology new patient note:    Date:  10/25/2022   ID:  Shane Diaz, DOB 09/16/1945, MRN [id number]  PCP:  [name], MD  Cardiologist:  Sanda Klein, MD New Electrophysiologist:  None   Referring MD: [name], MD   No chief complaint on file.   History of Present Illness:    [name] is a 77 y.o. 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  He has maintained normal sinus rhythm since undergoing cardioversion on on 12/12/2021.  Although initially we had suspected he had a skin rash related to amiodarone, but this did not resolve after he interrupted the medication and has not worsened with continued treatment with amiodarone.  She has severe bilateral lower extremity edema that has been complicated off-and-on by nonhealing ulcers.  Currently the edema is doing reasonably well and a couple of ulcerations, one on each leg, are healing.  He continues to have very poor compliance with sodium restricted diet.  Loves salty foods.  His wife tries to curtail his sodium intake, but he loves to eat cured meats and pickles.  Yesterday but that is anything else we can do for his edema.  I pointed out that we will try to schedule  him for a sleep study when at least 3 previous occasions, but he counseled every single time.  He wears oxygen at night, but not during the daytime.  We have tried to schedule him for sleep studies repeatedly, for the large part he has refused and even when these were scheduled he did not follow through.  He does not think he will ever wear CPAP.  I believe one of the major reasons for his swelling is severe right heart failure due to cor pulmonale, in turn related to obesity-hypoventilation syndrome and obstructive sleep apnea.  Every time he comes to our office his oxygen saturation has been 82-87%.  His weight today is 328 pounds and clearly his weight recorded at his last office visit was erroneous.  He has roughly the same amount of swelling.  The current best guess for his "dry weight" is as low as 260 pounds, based on his discharge weight after a previous hospitalization for heart failure.  We have had to use very large doses of loop diuretics to help with his lower extremity edema which has been complicated by blisters, active weeping and cellulitis.  He has never complained of chest pain, orthopnea or PND.  He denies palpitations or syncope.  He is very unsteady on his legs nevertheless.  He has a collapsible wheelchair that he uses around the house.  He does not have cough or hemoptysis and has not had any falls, serious injuries or bleeding.  He is compliant with anticoagulation with Eliquis.  Thankfully, it has been almost a year since he has last required hospitalization.  He had an estimated dry  weight of 260 pounds when he was hospitalized in 2021, has had limited success with outpatient oral diuretics.  During the most recent hospitalization he was treated with intravenous diuretics, but this had to be discontinued when he developed acute kidney injury.  Creatinine was 1.9 at the time of discharge on 09/17/2021 and was still elevated at 2.0 on 09/19/2021.  Outpatient labs showed improved  creatinine down to 1.5.  He had Covid pneumonia requiring hospitalization in October 2021.  He was hospitalized with hypoxemia due to decompensated congestive heart failure and atrial flutter with rapid ventricular response in early December 2021.   On transthoracic echocardiogram LV EF was 50-55%, but the right ventricle was mildly dilated and mildly depressed.  TEE showed LVEF 45-50% and a severely enlarged and depressed right ventricle with moderate tricuspid regurgitation. He underwent TEE guided cardioversion successfully on 07/13/2020 and discharged on beta-blockers and anticoagulants, but returned and was hospitalized again with similar problems on 08/01/2020.  An attempt at cardioversion on 08/02/2020 was not successful. IV Amiodarone was initiated.  Cardioversion was tried again the next day, again unsuccessfully.  The plan was to continue loading with amiodarone and bring back for cardioversion.  He was seen back in clinic in mid January and declined repeat cardioversion.  He was again hospitalized in February for volume overload complicated by cellulitis of the lower extremities, sepsis, atrial fibrillation rapid ventricular response.  He required fluid resuscitation with worsening edema.  The arrhythmia was managed conservatively and his volume status improved after intravenous diuretics.  At hospital discharge his dry weight was estimated to be 270 pounds.  At that weight, his wife reports that his legs were thin and that he no longer had edema or blisters or skin wounds.  Once the patient returned home, he returned to his old habits of adding salt to his food, eating a lot of processed meats and pickles.  He spends the day sitting in a chair at the computer and has developed gradually worsening lower extremity edema.  His weight has gone up 16 pounds and he has developed a new weeping wound on his right leg and blisters on the back of his left calf.  He has been unable to wear compression stockings  and does not like to keep his legs elevated.  He also developed a "lobster red" pruritic rash on his neck and shoulders and lower limbs.  He stopped taking amiodarone 4 days before this appointment and the rash is beginning to improve.  amiodarone was stopped and a year later he still has a rash that waxed and waned in severity.  Amiodarone was restarted in April 2023 without any worsening of the rash which continues to wax and wane.  In 2016, years ago he had a right distal femoral and popliteal DVT, partially resolved by follow-up ultrasound in late 2016 but with chronic occlusion of the right tibioperoneal venous trunk.  About 3 years ago he was having a lot of lower extremity pain related to large varicose veins and underwent venous ablation in his right lower extremity.  He had laser ablation of the left greater saphenous vein and stab phlebectomies of the left leg in November 2016  Starting in March 2020 he had gradually worsening pain and swelling in the left calf, more so than on the right.  In August of this year he developed severe swelling and discomfort in his left calf and was diagnosed with a DVT and started on Xarelto.  Switched to Eliquis later because  of suspected allergy (I suspect this was actually only normal skin changes related to brawny edema).    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  He has been living in [place] for the last 10 years or so.  Past Medical History:  Diagnosis Date   Acute diastolic heart failure ([place]) 08/03/2020   Arthritis    Diverticulosis 05/2015   Mild, noted on colonosocpy   Fatty liver 05/14/2015   noted on [place] ABD   History of colon polyps 05/2014   sessile in ascending colon, pedunculated in sigmoid colon, diverticulosis   Hyperlipidemia    pt denies   Hypertension    not currently taking medications  per MD   OSA (obstructive sleep apnea) 08/03/2020   Renal cyst 05/2014   Small, Left, noted on [place] ABD   Umbilical hernia    Varicose veins     Past Surgical History:  Procedure Laterality Date   APPENDECTOMY     childhood   BIOPSY  07/11/2020   Procedure: BIOPSY;  Surgeon: Ladene Artist, MD;  Location: [place];  Service: Endoscopy;;   CARDIOVERSION N/A 07/13/2020   Procedure: CARDIOVERSION;  Surgeon: Josue Hector, MD;  Location: [place];  Service: Cardiovascular;  Laterality: N/A;   CARDIOVERSION N/A 08/02/2020   Procedure: CARDIOVERSION;  Surgeon: Werner Lean, MD;  Location: [facility] ENDOSCOPY;  Service: Cardiovascular;  Laterality: N/A;   CARDIOVERSION N/A 12/12/2021   Procedure: CARDIOVERSION;  Surgeon: Fay Records, MD;  Location: [place];  Service: Cardiovascular;  Laterality: N/A;   COLONOSCOPY W/ POLYPECTOMY  05/23/2014   COLONOSCOPY WITH PROPOFOL N/A 07/11/2020   Procedure: COLONOSCOPY WITH PROPOFOL;  Surgeon: Ladene Artist, MD;  Location[facility];  Service: Endoscopy;  Laterality: N/A;   ESOPHAGOGASTRODUODENOSCOPY (EGD) WITH PROPOFOL N/A 07/11/2020   Procedure: ESOPHAGOGASTRODUODENOSCOPY (EGD) WITH PROPOFOL;  Surgeon: Ladene Artist, MD;  Location: [facility] ENDOSCOPY;  Service: Endoscopy;  Laterality: N/A;   TEE WITHOUT CARDIOVERSION N/A 07/13/2020   Procedure: TRANSESOPHAGEAL ECHOCARDIOGRAM (TEE);  Surgeon: Josue Hector, MD;  Location: [place];  Service: Cardiovascular;  Laterality: N/A;   UMBILICAL HERNIA REPAIR N/A 04/27/2018   Procedure: REPAIR UMBILICAL HERNIA ERAS PATHWAY;  Surgeon: Alphonsa Overall, MD;  Location: [facility];  Service: General;  Laterality: N/A;   varicose veins     WRIST SURGERY      Current Medications: Current Meds  Medication Sig   amiodarone (PACERONE) 200 MG tablet TAKE ONE TABLET BY MOUTH DAILY   apixaban (ELIQUIS) 5 MG TABS tablet Take 1 tablet (5 mg total) by mouth 2 (two) times daily.   bumetanide (BUMEX) 2  MG tablet Take 2 tablets (4 mg total) by mouth 2 (two) times daily.   doxycycline (VIBRAMYCIN) 100 MG capsule Take 100 mg by mouth 2 (two) times daily.   eplerenone (INSPRA) 25 MG tablet Take 1 tablet (25 mg total) by mouth daily.   Magnesium Oxide 400 MG CAPS Take 1 capsule (400 mg total) by mouth daily.   metoprolol succinate (TOPROL-XL) 50 MG 24 hr tablet TAKE ONE TABLET BY MOUTH DAILY WITH OR IMMEDIATELY FOLLOWING A MEAL   Potassium Chloride ER 20 MEQ TBCR Take 20 mEq by mouth 2 (two) times daily.   [DISCONTINUED] apixaban (ELIQUIS) 5 MG TABS tablet Take 1 tablet (5 mg total) by mouth 2 (two) times daily.  Allergies:   Diltiazem and Amiodarone   Social History   Socioeconomic History   Marital status: Married    Spouse name: Not on file   Number of children: 0   Years of education: Not on file   Highest education level: Not on file  Occupational History   Not on file  Tobacco Use   Smoking status: Never   Smokeless tobacco: Never  Vaping Use   Vaping Use: Never used  Substance and Sexual Activity   Alcohol use: Yes    Alcohol/week: 0.0 standard drinks of alcohol    Comment: occassional wine, martini   Drug use: No   Sexual activity: Not on file  Other Topics Concern   Not on file  Social History Narrative   Not on file   Social Determinants of Health   Financial Resource Strain: Not on file  Food Insecurity: Not on file  Transportation Needs: Not on file  Physical Activity: Not on file  Stress: Not on file  Social Connections: Not on file     Family History: The patient's family history is negative for Colon cancer, Esophageal cancer, Pancreatic cancer, Prostate cancer, Rectal cancer, and Stomach cancer.  ROS:   Please see the history of present illness.    All other systems are reviewed and are negative.   EKGs/Labs/Other Studies Reviewed:    The following studies were reviewed today: Extensive review of notes from his 3 hospitalizations in the last 3  months. Echocardiogram 03/19/2021  Study Result     1. Left ventricular ejection fraction, by estimation, is 55%. The left  ventricle has normal function. The left ventricle has no regional wall  motion abnormalities. There is severe left ventricular hypertrophy. Left  ventricular diastolic parameters are  indeterminate.   2. Right ventricular systolic function is normal. The right ventricular  size is normal.   3. Left atrial size was moderately dilated.   4. The mitral valve is abnormal. Trivial mitral valve regurgitation. No  evidence of mitral stenosis.   5. The aortic valve is tricuspid. Aortic valve regurgitation is not  visualized. Mild to moderate aortic valve sclerosis/calcification is  present, without any evidence of aortic stenosis.   6. The inferior vena cava is normal in size with greater than 50%  respiratory variability, suggesting right atrial pressure of 3 mmHg.     Transesophageal echocardiogram 07/13/2020   1. TEE with no RAA/LAA thrombus DCC x 1 with 200J converted from atrial  flutter rate 133 bpm to NSR rate 88 bpm On eliquis with no immediate  neurologic sequelae.   2. EF decreased with patient in rapid atrial flutter rate 133. Left  ventricular ejection fraction, by estimation, is 45 to 50%. The left  ventricle has mildly decreased function. There is mild left ventricular  hypertrophy.   3. Right ventricular systolic function is severely reduced. The right  ventricular size is severely enlarged.   4. Left atrial size was moderately dilated. No left atrial/left atrial  appendage thrombus was detected.   5. Right atrial size was severely dilated.   6. The mitral valve is normal in structure. Mild mitral valve  regurgitation.   7. Tricuspid valve regurgitation is moderate.   8. The aortic valve is tricuspid. Aortic valve regurgitation is mild.  Mild aortic valve sclerosis is present, with no evidence of aortic valve  stenosis.   Venous duplex  ultrasound 09/25/2021 RIGHT:  - No evidence of common femoral vein obstruction.     LEFT:  -  No evidence of deep vein thrombosis in the lower extremity. No indirect evidence of obstruction proximal to the inguinal ligament.  - No cystic structure found in the popliteal fossa.   EKG:    EKG is not ordered today.  Most recent tracing sinus rhythm with first-degree AV block (PR 222 ms), right bundle branch block (QRS 130 ms), QTc 470 ms without ischemic changes Recent Labs: 12/04/2021: BNP 106.3; Hemoglobin 16.0; Platelets 177 07/02/2022: ALT 11; BUN 19; Creatinine, Ser 1.60; Magnesium 1.9; Potassium 4.2; Sodium 141; TSH 7.820       Component Value Date/Time   CHOL 103 05/08/2020 0335   TRIG 71 05/08/2020 0335   HDL 27 (L) 05/08/2020 0335   CHOLHDL 3.8 05/08/2020 0335   VLDL 14 05/08/2020 0335   LDLCALC 62 05/08/2020 0335    Physical Exam:    VS:  BP (!) 107/58 (BP Location: Left Arm, Patient Position: Sitting, Cuff Size: Large)   Pulse 61   Ht 5\' 7"  (1.702 m)   Wt (!) 328 lb 12.8 oz (149.1 kg)   SpO2 (!) 87%   BMI 51.50 kg/m     Wt Readings from Last 3 Encounters:  10/23/22 (!) 328 lb 12.8 oz (149.1 kg)  07/02/22 294 lb (133.4 kg)  03/14/22 (!) 323 lb 9.6 oz (146.8 kg)     General: Alert, oriented x3, no distress, morbidly obese Head: no evidence of trauma, PERRL, EOMI, no exophtalmos or lid lag, no myxedema, no xanthelasma; normal ears, nose and oropharynx Neck: normal jugular venous pulsations and no hepatojugular reflux; brisk carotid pulses without delay and no carotid bruits Chest: clear to auscultation, no signs of consolidation by percussion or palpation, normal fremitus, symmetrical and full respiratory excursions Cardiovascular: normal position and quality of the apical impulse, regular rhythm, normal first and second heart sounds, no murmurs, rubs or gallops Abdomen: no tenderness or distention, no masses by palpation, no abnormal pulsatility or arterial bruits,  normal bowel sounds, no hepatosplenomegaly Extremities: Chronic severe brawny edema of both lower extremities from the thighs down. Psych: Normal mood and affect     ASSESSMENT:    1. Typical atrial flutter ([place])   2. Atrial fibrillation, unspecified type ([place])   3. Chronic diastolic HF (heart failure) ([place])   4. Acquired thrombophilia ([place])   5. Encounter for monitoring amiodarone therapy   6. Obesity, Class III, BMI 40-49.9 (morbid obesity) ([place])   7. Prediabetes   8. 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 Obesity hypoventilation syndrome ([place])   10. Obstructive sleep apnea syndrome   11. Snoring        PLAN:    In order of problems listed above:  Atrial flutter: This is most likely secondary right atrial dilation in the setting of chronic cor pulmonale.  He has maintained sinus rhythm since the cardioversion in May 2023, while on amiodarone.  He has also had atrial fibrillation in the past. CHF: He has severe right heart failure due to chronic cor pulmonale has findings of severe right heart failure, which is even more prominent due to his issues with peripheral venous insufficiency. He has chronic cor pulmonale due to OSA and probably obesity hypoventilation syndrome.  BNP has been barely above the upper limit of normal.  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    Previously his dry weight was around 260 lb (I doubt we  will be able to reach this target without intravenous diuretics and without worsening renal function).   Amiodarone: Has helped to maintain normal sinus rhythm.  Most recently had normal liver function tests and normal TSH in November 2023.  Will be due for repeat labs in May. Anticoagulation: No bleeding complications.  Plan lifelong anticoagulation for both atrial arrhythmia and history of DVT with postphlebitic syndrome.   DVT: Has  evidence of postphlebitic syndrome which further complicates the swelling related to right heart failure.  Plan lifelong anticoagulation.  Wears compression stockings at home intermittently.  Asked him to wear the compression stockings all the time. Lymphedema: In addition to his chronic problems with swelling from DVT and right heart failure, he has clearly developed secondary lymphedema, especially after previous episodes of cellulitis.   He has had repeated problems with nonhealing ulcers.  Will try to see if we can get him a lymphedema pump.  Obesity: The substrate for most if not all of his chronic health problems.  He has not managed to lose any weight.  Hard to say how much of his current weight is fluid however.  He may be caring as much is 60 or 70 pounds of excess fluid on board. Pre-DM: He does not have full-blown diabetes.  Consistently has glucose levels in the prediabetes range 100-120.   OSA/probable obesity hypoventilation syndrome:   STOP-BANG score is 8.  He is chronically hypoxemic if he does not wear oxygen.  Explained to him that he needs to wear the oxygen throughout the day and at night and we need to keep his oxygen saturation at least in the low 90s.  In addition, simply supplementing oxygen will not help if he has obstructive events of apnea/hypopnea when he sleeps.  He really needs a sleep study so we can get him appropriate treatment with BiPAP.  We agreed to start off with a home sleep study.  He has canceled these before in the past.  SATURATION QUALIFICATIONS: (This note is used to comply with regulatory documentation for home oxygen)  Patient Saturations on Room Air at Rest = 88% (95% on 2L O2)  Patient Saturations on Room Air while Ambulating = 80%  Patient Saturations on 2 Liters of oxygen while Ambulating = 92%  Please briefly explain why patient needs home oxygen: obesity hypoventilation syndrome and pulmonary hypertension.  Rash: Retrospectively this is unlikely  to be amiodarone related.  Etiology remains uncertain.  It has improved    Medication Adjustments/Labs and Tests Ordered: Current medicines are reviewed at length with the patient today.  Concerns regarding medicines are outlined above.  Orders Placed This Encounter  Procedures   Comprehensive metabolic panel   TSH   EKG 12-Lead   [facility] Sleep Study       Meds ordered this encounter  Medications   apixaban (ELIQUIS) 5 MG TABS tablet    Sig: Take 1 tablet (5 mg total) by mouth 2 (two) times daily.    Dispense:  42 tablet    Refill:  0    Order Specific Question:   Lot Number?    AnswerCrista [id number]    Order Specific Question:   Expiration Date?    Answer:   04/04/2024    Order Specific Question:   Quantity    Answer:   42    Comments:   3 boxes= 42 tablets   apixaban (ELIQUIS) 5 MG TABS tablet    Sig: Take 1 tablet (5 mg total) by  mouth 2 (two) times daily.    Dispense:  180 tablet    Refill:  3        Patient Instructions  Medication Instructions:  No changes *If you need a refill on your cardiac medications before your next appointment, please call your pharmacy*   Lab Work: TSH, CMET- in 2 months Please return for Blood Work in 2 months. No appointment needed, lab here at the office is open Monday-Friday from 8AM to 4PM and closed daily for lunch from 12:45-1:45.   If you have labs (blood work) drawn today and your tests are completely normal, you will receive your results only by: [place] (if you have MyChart) OR A paper copy in the mail If you have any lab test that is abnormal or we need to change your treatment, we will call you to review the results.   Testing/Procedures: WatchPAT?  Is a FDA cleared portable home sleep study test that uses a watch and 3 points of contact to monitor 7 different channels, including your heart rate, oxygen saturations, body position, snoring, and chest motion.  The study is easy to use from the comfort of your own  home and accurately detect sleep apnea.  Before bed, you attach the chest sensor, attached the sleep apnea bracelet to your nondominant hand, and attach the finger probe.  After the study, the raw data is downloaded from the watch and scored for apnea events.   For more information: [email or web address]  Patient Testing Instructions:  Do not put battery into the device until bedtime when you are ready to begin the test. Please call the support number if you need assistance after following the instructions below: 24 hour support line- [phone] or [facility] support at [phone] (option 2)  Download the [employer] AmericanWatchPAT One" app through the google play store or App Store  Be sure to turn on or enable access to bluetooth in [place] on your smartphone/ device  Make sure no other bluetooth devices are on and within the vicinity of your smartphone/ device and WatchPAT watch during testing.  Make sure to leave your smart phone/ device plugged in and charging all night.  When ready for bed:  Follow the instructions step by step in the WatchPAT One App to activate the testing device. For additional instructions, including video instruction, visit the WatchPAT One video on Youtube. You can search for [place] One within Youtube (video is 4 minutes and 18 seconds) or enter: https://youtube/[email or web address]?[email or web address]_vbiwxE Please note: You will be prompted to enter a Pin to connect via bluetooth when starting the test. The PIN will be assigned to you when you receive the test.  The device is disposable, but it recommended that you retain the device until you receive a call letting you know the study has been received and the results have been interpreted.  We will let you know if the study did not transmit to [place] properly after the test is completed. You do not need to call us to confirm the receipt of the test.  Please complete the test within 48 hours of receiving PIN.   Frequently Asked Questions:   What is Watch [place] one?  A single use fully disposable home sleep apnea testing device and will not need to be returned after completion.  What are the requirements to use WatchPAT one?  The be able to have a successful watchpat one sleep study, you should have your Watch pat one device, your smart phone, watch pat one app, your  PIN number and Internet access What type of phone do I need?  You should have a smart phone that uses Android 5.1 and above or any Iphone with IOS 10 and above How can I download the WatchPAT one app?  Based on your device type search for WatchPAT one app either in google play for android devices or APP store for Iphone's Where will I get my PIN for the study?  Your PIN will be provided by your physician's office. It is used for authentication and if you lose/forget your PIN, please reach out to your providers office.  I do not have Internet at home. Can I do WatchPAT one study?  WatchPAT One needs Internet connection throughout the night to be able to transmit the sleep data. You can use your home/local internet or your cellular's data package. However, it is always recommended to use home/local Internet. It is estimated that between 20MB-30MB will be used with each study.However, the application will be looking for 80MB space in the phone to start the study.  What happens if I lose internet or bluetooth connection?  During the internet disconnection, your phone will not be able to transmit the sleep data. All the data, will be stored in your phone. As soon as the internet connection is back on, the phone will being sending the sleep data. During the bluetooth disconnection, WatchPAT one will not be able to to send the sleep data to your phone. Data will be kept in the [facility] one until two devices have bluetooth connection back on. As soon as the connection is back on, WatchPAT one will send the sleep data to the phone.  How long do I need to wear the WatchPAT one?  After  you start the study, you should wear the device at least 6 hours.  How far should I keep my phone from the device?  During the night, your phone should be within 15 feet.  What happens if I leave the room for restroom or other reasons?  Leaving the room for any reason will not cause any problem. As soon as your get back to the room, both devices will reconnect and will continue to send the sleep data. Can I use my phone during the sleep study?  Yes, you can use your phone as usual during the study. But it is recommended to put your watchpat one on when you are ready to go to bed.  How will I get my study results?  A soon as you completed your study, your sleep data will be sent to the provider. They will then share the results with you when they are ready.     Follow-Up: At [facility], you and your health needs are our priority.  As part of our continuing mission to provide you with exceptional heart care, we have created designated Provider Care Teams.  These Care Teams include your primary Cardiologist (physician) and Advanced Practice Providers (APPs -  Physician Assistants and Nurse Practitioners) who all work together to provide you with the care you need, when you need it.  We recommend signing up for the patient portal called "MyChart".  Sign up information is provided on this After Visit Summary.  MyChart is used to connect with patients for Virtual Visits (Telemedicine).  Patients are able to view lab/test results, encounter notes, upcoming appointments, etc.  Non-urgent messages can be sent to your provider as well.   To learn more about what you can do with MyChart,  go to [email or web address].    Your next appointment:   4 month(s)- Regular office visit  Provider:   Sanda Klein, MD     Wear Oxygen all the time     Signed, Sanda Klein, MD  10/25/2022 6:40 PM    [place]

## 2022-10-23 NOTE — Patient Instructions (Addendum)
Medication Instructions:  No changes *If you need a refill on your cardiac medications before your next appointment, please call your pharmacy*   Lab Work: TSH, CMET- in 2 months Please return for Blood Work in 2 months. No appointment needed, lab here at the office is open Monday-Friday from 8AM to 4PM and closed daily for lunch from 12:45-1:45.   If you have labs (blood work) drawn today and your tests are completely normal, you will receive your results only by: [place] (if you have MyChart) OR A paper copy in the mail If you have any lab test that is abnormal or we need to change your treatment, we will call you to review the results.   Testing/Procedures: WatchPAT?  Is a FDA cleared portable home sleep study test that uses a watch and 3 points of contact to monitor 7 different channels, including your heart rate, oxygen saturations, body position, snoring, and chest motion.  The study is easy to use from the comfort of your own home and accurately detect sleep apnea.  Before bed, you attach the chest sensor, attached the sleep apnea bracelet to your nondominant hand, and attach the finger probe.  After the study, the raw data is downloaded from the watch and scored for apnea events.   For more information: [email or web address]  Patient Testing Instructions:  Do not put battery into the device until bedtime when you are ready to begin the test. Please call the support number if you need assistance after following the instructions below: 24 hour support line- [phone] or ITAMAR support at [phone] (option 2)  Download the [employer] American[employer]PAT One" app through the google play store or App Store  Be sure to turn on or enable access to bluetooth in [place] on your smartphone/ device  Make sure no other bluetooth devices are on and within the vicinity of your smartphone/ device and WatchPAT watch during testing.  Make sure to leave your smart phone/ device  plugged in and charging all night.  When ready for bed:  Follow the instructions step by step in the WatchPAT One App to activate the testing device. For additional instructions, including video instruction, visit the WatchPAT One video on Youtube. You can search for [place] One within Youtube (video is 4 minutes and 18 seconds) or enter: https://youtube/watch?v=BCce_vbiwxE Please note: You will be prompted to enter a Pin to connect via bluetooth when starting the test. The PIN will be assigned to you when you receive the test.  The device is disposable, but it recommended that you retain the device until you receive a call letting you know the study has been received and the results have been interpreted.  We will let you know if the study did not transmit to [place] properly after the test is completed. You do not need to call us to confirm the receipt of the test.  Please complete the test within 48 hours of receiving PIN.   Frequently Asked Questions:  What is Watch [place] one?  A single use fully disposable home sleep apnea testing device and will not need to be returned after completion.  What are the requirements to use WatchPAT one?  The be able to have a successful watchpat one sleep study, you should have your Watch pat one device, your smart phone, watch pat one app, your PIN number and Internet access What type of phone do I need?  You should have a smart phone that uses Android 5.1 and above or any Iphone with  IOS 10 and above How can I download the WatchPAT one app?  Based on your device type search for WatchPAT one app either in google play for android devices or APP store for Iphone's Where will I get my PIN for the study?  Your PIN will be provided by your physician's office. It is used for authentication and if you lose/forget your PIN, please reach out to your providers office.  I do not have Internet at home. Can I do WatchPAT one study?  WatchPAT One needs Internet connection  throughout the night to be able to transmit the sleep data. You can use your home/local internet or your cellular's data package. However, it is always recommended to use home/local Internet. It is estimated that between 20MB-30MB will be used with each study.However, the application will be looking for 80MB space in the phone to start the study.  What happens if I lose internet or bluetooth connection?  During the internet disconnection, your phone will not be able to transmit the sleep data. All the data, will be stored in your phone. As soon as the internet connection is back on, the phone will being sending the sleep data. During the bluetooth disconnection, WatchPAT one will not be able to to send the sleep data to your phone. Data will be kept in the [facility] one until two devices have bluetooth connection back on. As soon as the connection is back on, WatchPAT one will send the sleep data to the phone.  How long do I need to wear the WatchPAT one?  After you start the study, you should wear the device at least 6 hours.  How far should I keep my phone from the device?  During the night, your phone should be within 15 feet.  What happens if I leave the room for restroom or other reasons?  Leaving the room for any reason will not cause any problem. As soon as your get back to the room, both devices will reconnect and will continue to send the sleep data. Can I use my phone during the sleep study?  Yes, you can use your phone as usual during the study. But it is recommended to put your watchpat one on when you are ready to go to bed.  How will I get my study results?  A soon as you completed your study, your sleep data will be sent to the provider. They will then share the results with you when they are ready.     Follow-Up: At [facility], you and your health needs are our priority.  As part of our continuing mission to provide you with exceptional heart care, we have created  designated Provider Care Teams.  These Care Teams include your primary Cardiologist (physician) and Advanced Practice Providers (APPs -  Physician Assistants and Nurse Practitioners) who all work together to provide you with the care you need, when you need it.  We recommend signing up for the patient portal called "MyChart".  Sign up information is provided on this After Visit Summary.  MyChart is used to connect with patients for Virtual Visits (Telemedicine).  Patients are able to view lab/test results, encounter notes, upcoming appointments, etc.  Non-urgent messages can be sent to your provider as well.   To learn more about what you can do with MyChart, go to [email or web address].    Your next appointment:   4 month(s)- Regular office visit  Provider:   Sanda Klein, MD     Wear  Oxygen all the time

## 2022-10-24 ENCOUNTER — Telehealth: Payer: Self-pay | Admitting: *Deleted

## 2022-10-24 NOTE — Telephone Encounter (Signed)
Secure chat message sent to [place] ok to activate itamar device. 

## 2022-10-25 ENCOUNTER — Encounter: Payer: Self-pay | Admitting: Cardiovascular Disease

## 2022-10-27 ENCOUNTER — Telehealth: Payer: Self-pay

## 2022-10-27 ENCOUNTER — Encounter (INDEPENDENT_AMBULATORY_CARE_PROVIDER_SITE_OTHER): Payer: Medicare Other | Admitting: Cardiology

## 2022-10-27 DIAGNOSIS — G4733 Obstructive sleep apnea (adult) (pediatric): Secondary | ICD-10-CM

## 2022-10-27 NOTE — Telephone Encounter (Signed)
Routed to [place] and [place]

## 2022-10-27 NOTE — Telephone Encounter (Signed)
This encounter was created in error - please disregard.

## 2022-10-27 NOTE — Telephone Encounter (Signed)
Called and made the patient aware that HE may proceed with the Itamar Home Sleep Study. PIN # provided to the patient. Patient made aware that HE will be contacted after the test has been read with the results and any recommendations. Patient verbalized understanding and thanked me for the call.   

## 2022-10-27 NOTE — Telephone Encounter (Signed)
Patient is following up, requesting to speak with CMA again. He says he is having issues installing equipment for home sleep study.

## 2022-10-28 ENCOUNTER — Encounter (INDEPENDENT_AMBULATORY_CARE_PROVIDER_SITE_OTHER): Payer: Self-pay | Admitting: Ophthalmology

## 2022-10-28 ENCOUNTER — Other Ambulatory Visit: Payer: Self-pay | Admitting: Cardiology

## 2022-10-28 ENCOUNTER — Ambulatory Visit (INDEPENDENT_AMBULATORY_CARE_PROVIDER_SITE_OTHER): Payer: Medicare Other | Admitting: Ophthalmology

## 2022-10-28 ENCOUNTER — Telehealth: Payer: Self-pay | Admitting: *Deleted

## 2022-10-28 DIAGNOSIS — H35033 Hypertensive retinopathy, bilateral: Secondary | ICD-10-CM

## 2022-10-28 DIAGNOSIS — I10 Essential (primary) hypertension: Secondary | ICD-10-CM | POA: Diagnosis not present

## 2022-10-28 DIAGNOSIS — H353111 Nonexudative age-related macular degeneration, right eye, early dry stage: Secondary | ICD-10-CM

## 2022-10-28 DIAGNOSIS — Z961 Presence of intraocular lens: Secondary | ICD-10-CM

## 2022-10-28 DIAGNOSIS — G4733 Obstructive sleep apnea (adult) (pediatric): Secondary | ICD-10-CM

## 2022-10-28 DIAGNOSIS — H353221 Exudative age-related macular degeneration, left eye, with active choroidal neovascularization: Secondary | ICD-10-CM | POA: Diagnosis not present

## 2022-10-28 MED ORDER — AFLIBERCEPT 2MG/0.05ML IZ SOLN FOR KALEIDOSCOPE
2.0000 mg | INTRAVITREAL | Status: AC | PRN
  Administered 2022-10-28: 2 mg via INTRAVITREAL

## 2022-10-28 NOTE — Telephone Encounter (Signed)
-----   Message from Sueanne Margarita, MD sent at 10/28/2022 10:10 AM EDT ----- Please let patient know that they have sleep apnea.  Recommend therapeutic CPAP titration for treatment of patient's sleep disordered breathing.  If unable to perform an in lab titration then initiate ResMed auto CPAP from 4 to 15cm H2O with heated humidity and mask of choice and overnight pulse ox on CPAP.

## 2022-10-28 NOTE — Procedures (Signed)
SLEEP STUDY REPORT Patient Information Study Date: 10/27/2022 Patient Name: Shane Diaz Patient ID: [id number] Birth Date: 09/08/1945 Age: 77 Gender: Male BMI: 51.6 (W=328 lb, H=5' 7'') Referring Physician: Sanda Klein, MD  TEST DESCRIPTION: Home sleep apnea testing was completed using the WatchPat, a Type 1 device, utilizing peripheral arterial tonometry (PAT), chest movement, actigraphy, pulse oximetry, pulse rate, body position and snore.  AHI was calculated with apnea and hypopnea using valid sleep time as the denominator. RDI includes apneas, hypopneas, and RERAs.  The data acquired and the scoring of sleep and all associated events were performed in accordance with the recommended standards and specifications as outlined in the AASM Manual for the Scoring of Sleep and Associated Events 2.2.0 (2015).  FINDINGS:  1.  Severe Obstructive Sleep Apnea with AHI 69/hr.   2.  No Central Sleep Apnea with pAHIc 0/hr.  3.  Oxygen desaturations as low as 53%.  4.  Moderate snoring was present. O2 sats were < 88% for 508 min.  5.  Total sleep time was 3 hrs and 0 min.  6.  0% of total sleep time was spent in REM sleep.   7.  Normal sleep onset latency at 25 min.   8.  No REM sleep was present.   9.  Total awakenings were 4.  10. Arrhythmia detection:  Suggestive of possible brief atrial fibrillation lasting 35 seconds.  This is not diagnostic and further testing with outpatient telemetry monitoring is recommended.  DIAGNOSIS:   Severe Obstructive Sleep Apnea (G47.33) Nocturnal Hypoxemia Possible Atrial Arrhythmias  RECOMMENDATIONS:   1.  Clinical correlation of these findings is necessary.  The decision to treat obstructive sleep apnea (OSA) is usually based on the presence of apnea symptoms or the presence of associated medical conditions such as Hypertension, Congestive Heart Failure, Atrial Fibrillation or Obesity.  The most common symptoms of OSA are snoring, gasping for breath  while sleeping, daytime sleepiness and fatigue.   2.  Initiating apnea therapy is recommended given the presence of symptoms and/or associated conditions. Recommend proceeding with one of the following:     a.  Auto-CPAP therapy with a pressure range of 5-20cm H2O.     b.  An oral appliance (OA) that can be obtained from certain dentists with expertise in sleep medicine.  These are primarily of use in non-obese patients with mild and moderate disease.     c.  An ENT consultation which may be useful to look for specific causes of obstruction and possible treatment options.     d.  If patient is intolerant to PAP therapy, consider referral to ENT for evaluation for hypoglossal nerve stimulator.   3.  Close follow-up is necessary to ensure success with CPAP or oral appliance therapy for maximum benefit.  4.  A follow-up oximetry study on CPAP is recommended to assess the adequacy of therapy and determine the need for supplemental oxygen or the potential need for Bi-level therapy.  An arterial blood gas to determine the adequacy of baseline ventilation and oxygenation should also be considered.  5.  Healthy sleep recommendations include:  adequate nightly sleep (normal 7-9 hrs/night), avoidance of caffeine after noon and alcohol near bedtime, and maintaining a sleep environment that is cool, dark and quiet.  6.  Weight loss for overweight patients is recommended.  Even modest amounts of weight loss can significantly improve the severity of sleep apnea.  7.  Snoring recommendations include:  weight loss where appropriate, side sleeping, and avoidance  of alcohol before bed.  8.  Operation of motor vehicle should be avoided when sleepy.  9.  Consider outpatient event monitor to assess for silent atrial arrhythmias if clinically indicated.  Signature: Fransico Him, MD; [facility]; Diplomat, American Board of Sleep Medicine Electronically Signed: 10/28/2022

## 2022-10-28 NOTE — Telephone Encounter (Signed)
Patient notified of sleep study results and recommendations. He states that he understands what I am telling him and agrees to proceed with CPAP titration.

## 2022-11-11 NOTE — Progress Notes (Signed)
[facility] - Clinic Note  11/25/2022     CHIEF COMPLAINT Patient presents for Retina Follow Up   HISTORY OF PRESENT ILLNESS: Shane Diaz is a 77 y.o. male who presents to the clinic today for:   HPI     Retina Follow Up   Patient presents with  Wet AMD.  In both eyes.  This started 4 weeks ago.  Duration of 4 weeks.  Since onset it is stable.  I, the attending physician,  performed the HPI with the patient and updated documentation appropriately.        Comments   4 week retina follow up AMD and I'VE OS pt is reporting vision seems stable since his last visit denies any flashes or floaters       Last edited by Rennis Chris, MD on 11/25/2022 11:40 AM.    Pt states he has been out of eplerenone for 2 days, he has not noticed any change in vision  Referring physician: Ralene Ok, MD [id number] [place],  [place] [address]  HISTORICAL INFORMATION:   Selected notes from the MEDICAL RECORD NUMBER Dr. Luciana Axe pt -- transferring care due to insurance LEE:  Ocular Hx- PMH-    CURRENT MEDICATIONS: No current outpatient medications on file. (Ophthalmic Drugs)   No current facility-administered medications for this visit. (Ophthalmic Drugs)   Current Outpatient Medications (Other)  Medication Sig   amiodarone (PACERONE) 200 MG tablet TAKE ONE TABLET BY MOUTH DAILY   apixaban (ELIQUIS) 5 MG TABS tablet Take 1 tablet (5 mg total) by mouth 2 (two) times daily.   apixaban (ELIQUIS) 5 MG TABS tablet Take 1 tablet (5 mg total) by mouth 2 (two) times daily.   bumetanide (BUMEX) 2 MG tablet Take 2 tablets (4 mg total) by mouth 2 (two) times daily.   doxycycline (VIBRAMYCIN) 100 MG capsule Take 100 mg by mouth 2 (two) times daily.   eplerenone (INSPRA) 25 MG tablet Take 1 tablet (25 mg total) by mouth daily.   Magnesium Oxide 400 MG CAPS Take 1 capsule (400 mg total) by mouth daily.   metoprolol succinate (TOPROL-XL) 50 MG 24 hr tablet TAKE ONE TABLET BY  MOUTH DAILY WITH OR IMMEDIATELY FOLLOWING A MEAL   nitroGLYCERIN (NITROSTAT) 0.4 MG SL tablet Place 1 tablet (0.4 mg total) under the tongue every 5 (five) minutes x 3 doses as needed for chest pain. (Patient not taking: Reported on 10/23/2022)   Potassium Chloride ER 20 MEQ TBCR Take 20 mEq by mouth 2 (two) times daily.   No current facility-administered medications for this visit. (Other)   REVIEW OF SYSTEMS: ROS   Positive for: Cardiovascular, Eyes, Respiratory Negative for: Constitutional, Gastrointestinal, Neurological, Skin, Genitourinary, Musculoskeletal, HENT, Endocrine, Psychiatric, Allergic/Imm, Heme/Lymph Last edited by Etheleen Mayhew, COT on 11/25/2022 10:00 AM.        ALLERGIES Allergies  Allergen Reactions   Diltiazem     rash   Amiodarone Rash   PAST MEDICAL HISTORY Past Medical History:  Diagnosis Date   Acute diastolic heart failure 08/03/2020   Arthritis    Diverticulosis 05/2015   Mild, noted on colonosocpy   Fatty liver 05/14/2015   noted on [place] ABD   History of colon polyps 05/2014   sessile in ascending colon, pedunculated in sigmoid colon, diverticulosis   Hyperlipidemia    pt denies   Hypertension    not currently taking medications per MD   OSA (obstructive sleep apnea) 08/03/2020   Renal cyst  05/2014   Small, Left, noted on [place] ABD   Umbilical hernia    Varicose veins    Past Surgical History:  Procedure Laterality Date   APPENDECTOMY     childhood   BIOPSY  07/11/2020   Procedure: BIOPSY;  Surgeon: Meryl Dare, MD;  Location[facility] ENDOSCOPY;  Service: Endoscopy;;   CARDIOVERSION N/A 07/13/2020   Procedure: CARDIOVERSION;  Surgeon: Wendall Stade, MD;  Location: [facility] ENDOSCOPY;  Service: Cardiovascular;  Laterality: N/A;   CARDIOVERSION N/A 08/02/2020   Procedure: CARDIOVERSION;  Surgeon: Christell Constant, MD;  Location: [facility] ENDOSCOPY;  Service: Cardiovascular;  Laterality: N/A;   CARDIOVERSION N/A 12/12/2021   Procedure:  CARDIOVERSION;  Surgeon: Pricilla Riffle, MD;  Location: [facility] ENDOSCOPY;  Service: Cardiovascular;  Laterality: N/A;   COLONOSCOPY W/ POLYPECTOMY  05/23/2014   COLONOSCOPY WITH PROPOFOL N/A 07/11/2020   Procedure: COLONOSCOPY WITH PROPOFOL;  Surgeon: Meryl Dare, MD;  Location: [facility] ENDOSCOPY;  Service: Endoscopy;  Laterality: N/A;   ESOPHAGOGASTRODUODENOSCOPY (EGD) WITH PROPOFOL N/A 07/11/2020   Procedure: ESOPHAGOGASTRODUODENOSCOPY (EGD) WITH PROPOFOL;  Surgeon: Meryl Dare, MD;  Location: [facility] ENDOSCOPY;  Service: Endoscopy;  Laterality: N/A;   TEE WITHOUT CARDIOVERSION N/A 07/13/2020   Procedure: TRANSESOPHAGEAL ECHOCARDIOGRAM (TEE);  Surgeon: Wendall Stade, MD;  Location: [facility] ENDOSCOPY;  Service: Cardiovascular;  Laterality: N/A;   UMBILICAL HERNIA REPAIR N/A 04/27/2018   Procedure: REPAIR UMBILICAL HERNIA ERAS PATHWAY;  Surgeon: Ovidio Kin, MD;  Location: [facility] ORS;  Service: General;  Laterality: N/A;   varicose veins     WRIST SURGERY     FAMILY HISTORY Family History  Problem Relation Age of Onset   Colon cancer Neg Hx    Esophageal cancer Neg Hx    Pancreatic cancer Neg Hx    Prostate cancer Neg Hx    Rectal cancer Neg Hx    Stomach cancer Neg Hx    SOCIAL HISTORY Social History   Tobacco Use   Smoking status: Never   Smokeless tobacco: Never  Vaping Use   Vaping Use: Never used  Substance Use Topics   Alcohol use: Yes    Alcohol/week: 0.0 standard drinks of alcohol    Comment: occassional wine, martini   Drug use: No       OPHTHALMIC EXAM:  Base Eye Exam     Visual Acuity (Snellen - Linear)       Right Left   Dist [place] 20/50 -2 20/80 -1   Dist ph [place] 20/40 NI         Tonometry (Tonopen, 10:06 AM)       Right Left   Pressure 20 18         Pupils       Pupils Dark Light Shape React APD   Right PERRL 3 2 Round Brisk None   Left PERRL 3 2 Round Brisk None         Visual Fields       Left Right    Full Full         Extraocular Movement        Right Left    Full, Ortho Full, Ortho         Neuro/Psych     Oriented x3: Yes   Mood/Affect: Normal         Dilation     Both eyes: 2.5% Phenylephrine @ 10:06 AM           Slit Lamp and Fundus Exam  External Exam       Right Left   External Normal Normal         Slit Lamp Exam       Right Left   Lids/Lashes Dermatochalasis - upper lid, Meibomian gland dysfunction, mild Ptosis Dermatochalasis - upper lid, Meibomian gland dysfunction, mild Ptosis   Conjunctiva/Sclera White and quiet Mild Conjunctivochalasis inferiorly   Cornea trace PEE, well healed cataract wound well healed cataract wound   Anterior Chamber deep and clear deep and clear   Iris Round and dilated Round and dilated   Lens PC IOL in good position PC IOL in good position, trace Posterior capsular opacification   Anterior Vitreous Vitreous syneresis Normal         Fundus Exam       Right Left   Posterior Vitreous Posterior vitreous detachment Partial PVD   Disc mild pallor, Sharp rim, +PPA trace Pallor, Sharp rim, mild PPA   C/D Ratio 0.3 0.3   Macula Flat, Good foveal reflex, RPE mottling, No heme or edema, mild ERM Blunted foveal reflex, RPE mottling and clumping, focal CNV with central SRF -- improved   Vessels attenuated, mild tortuosity attenuated, Tortuous   Periphery Attached, focal DBH temporal periphery, mild reticular degeneration Attached, focal DBH temporal periphery           IMAGING AND PROCEDURES  Imaging and Procedures for 11/25/2022  OCT, Retina - OU - Both Eyes       Right Eye Quality was good. Scan locations included subfoveal. Central Foveal Thickness: 311. Progression has been stable. Findings include no IRF, no SRF, abnormal foveal contour, retinal drusen , epiretinal membrane, macular pucker, pigment epithelial detachment (ERM with blunting of foveal contour, early pucker, partial PVD, focal peripapillary PED ST to disc).   Left Eye Quality was good.  Scan locations included subfoveal. Central Foveal Thickness: 269. Progression has improved. Findings include no IRF, abnormal foveal contour, retinal drusen , subretinal hyper-reflective material, pigment epithelial detachment, subretinal fluid, vitreomacular adhesion (Mild interval improvement in SRF with focal CNV/SRHM, partial PVD).   Notes *Images captured and stored on drive  Diagnosis / Impression:  OD: NFP; no IRF/SRF; ERM with blunting of foveal contour, early pucker, partial PVD, focal peripapillary PED ST to disc OS: Mild interval improvement in SRF with focal CNV/SRHM, partial PVD  Clinical management:  See below  Abbreviations: NFP - Normal foveal profile. CME - cystoid macular edema. PED - pigment epithelial detachment. IRF - intraretinal fluid. SRF - subretinal fluid. EZ - ellipsoid zone. ERM - epiretinal membrane. ORA - outer retinal atrophy. ORT - outer retinal tubulation. SRHM - subretinal hyper-reflective material. IRHM - intraretinal hyper-reflective material      Intravitreal Injection, Pharmacologic Agent - OS - Left Eye       Time Out 11/25/2022. 11:25 AM. Confirmed correct patient, procedure, site, and patient consented.   Anesthesia Topical anesthesia was used. Anesthetic medications included Lidocaine 2%, Proparacaine 0.5%.   Procedure Preparation included 5% betadine to ocular surface, eyelid speculum. A (32g) needle was used.   Injection: 2 mg aflibercept 2 MG/0.05ML   Route: Intravitreal, Site: Left Eye   NDC: [id number], Lot: [id number], Expiration date: 11/02/2023, Waste: 0 mL   Post-op Post injection exam found visual acuity of at least counting fingers. The patient tolerated the procedure well. There were no complications. The patient received written and verbal post procedure care education. Post injection medications were not given.  ASSESSMENT/PLAN:    ICD-10-CM   1. Exudative age-related macular degeneration of left eye with  active choroidal neovascularization  H35.3221 OCT, Retina - OU - Both Eyes    Intravitreal Injection, Pharmacologic Agent - OS - Left Eye    aflibercept (EYLEA) SOLN 2 mg    2. 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 Essential hypertension  I10     4. Hypertensive retinopathy of both eyes  H35.033     5. Pseudophakia, both eyes  Z96.1       Exudative age related macular degeneration, OS  - previously followed with Dr. Luciana Axe  - s/p IVA OS x2, last injection mid December -- ~6 wks  - s/p IVA OS #3 (01.24.24), #4 (02.21.24) -- IVA resistance  - s/p IVE OS #1 (03.26.24) - FA (01.24.24) shows Mild central staining, minimal leakage, no smokestack or dot blot pattern  - OCT shows OS: Mild interval improvement in SRF with focal CNV/SRHM   - recommend IVE OS #2 today, 04.23.24 w/ f/u in 4 wks - pt wishes to proceed with injection - RBA of procedure discussed, questions answered - IVA informed consent obtained and signed, 01.24.24 (OS) - IVE informed consent obtained and signed, 03.26.24 - see procedure note  - cont eplerenone 25mg  once a day for possible CSCR component -- pt has been out for 2 days  - f/u in 4 wks -- DFE/OCT, possible injection  2. Age related macular degeneration, non-exudative, right eye  - early stage w/ minimal drusen  - The incidence, anatomy, and pathology of dry AMD, risk of progression, and the AREDS and AREDS 2 study including smoking risks discussed with patient.  - Recommend amsler grid monitoring  3,4. Hypertensive retinopathy OU - discussed importance of tight BP control - continue to monitor  5. Pseudophakia OU  - s/p CE/IOL  - IOL in good position, doing well  - continue to monitor  Ophthalmic Meds Ordered this visit:  Meds ordered this encounter  Medications   aflibercept (EYLEA) SOLN 2 mg     Return in about 4 weeks (around 12/23/2022) for f/u exu ARMD OS, DFE.  There are no Patient  Instructions on file for this visit.   Explained the diagnoses, plan, and follow up with the patient and they expressed understanding.  Patient expressed understanding of the importance of proper follow up care.   This document serves as a record of services personally performed by Karie Chimera, MD, PhD. It was created on their behalf by Gerilyn Nestle, COT an ophthalmic technician. The creation of this record is the provider's dictation and/or activities during the visit.    Electronically signed by:  Gerilyn Nestle, COT  04.09.24 4:57 PM  This document serves as a record of services personally performed by Karie Chimera, MD, PhD. It was created on their behalf by Glee Arvin. Manson Passey, OA an ophthalmic technician. The creation of this record is the provider's dictation and/or activities during the visit.    Electronically signed by: Glee Arvin. Manson Passey, [place] 04.23.2024 4:57 PM  Karie Chimera, M.D., Ph.D. Diseases & Surgery of the Retina and Vitreous [facility]  I have reviewed the above documentation for accuracy and completeness, and I agree with the above. Karie Chimera, M.D., Ph.D. 11/26/22 4:59 PM   Abbreviations: M myopia (nearsighted); A astigmatism; H hyperopia (farsighted); P presbyopia; Mrx spectacle prescription;  CTL contact lenses; OD right  eye; OS left eye; OU both eyes  XT exotropia; ET esotropia; PEK punctate epithelial keratitis; PEE punctate epithelial erosions; DES dry eye syndrome; MGD meibomian gland dysfunction; ATs artificial tears; PFAT's preservative free artificial tears; NSC nuclear sclerotic cataract; PSC posterior subcapsular cataract; ERM epi-retinal membrane; PVD posterior vitreous detachment; RD retinal detachment; DM diabetes mellitus; DR diabetic retinopathy; NPDR non-proliferative diabetic retinopathy; PDR proliferative diabetic retinopathy; CSME clinically significant macular edema; DME diabetic macular edema; dbh dot blot  hemorrhages; CWS cotton wool spot; POAG primary open angle glaucoma; C/D cup-to-disc ratio; HVF humphrey visual field; GVF goldmann visual field; OCT optical coherence tomography; IOP intraocular pressure; BRVO Branch retinal vein occlusion; CRVO central retinal vein occlusion; CRAO central retinal artery occlusion; BRAO branch retinal artery occlusion; RT retinal tear; SB scleral buckle; PPV pars plana vitrectomy; VH Vitreous hemorrhage; PRP panretinal laser photocoagulation; IVK intravitreal kenalog; VMT vitreomacular traction; MH Macular hole;  NVD neovascularization of the disc; NVE neovascularization elsewhere; AREDS age related eye disease study; ARMD age related macular degeneration; POAG primary open angle glaucoma; EBMD epithelial/anterior basement membrane dystrophy; ACIOL anterior chamber intraocular lens; IOL intraocular lens; PCIOL posterior chamber intraocular lens; Phaco/IOL phacoemulsification with intraocular lens placement; PRK photorefractive keratectomy; LASIK laser assisted in situ keratomileusis; HTN hypertension; DM diabetes mellitus; COPD chronic obstructive pulmonary disease

## 2022-11-25 ENCOUNTER — Ambulatory Visit (INDEPENDENT_AMBULATORY_CARE_PROVIDER_SITE_OTHER): Payer: Medicare Other | Admitting: Ophthalmology

## 2022-11-25 ENCOUNTER — Encounter (INDEPENDENT_AMBULATORY_CARE_PROVIDER_SITE_OTHER): Payer: Self-pay | Admitting: Ophthalmology

## 2022-11-25 DIAGNOSIS — I10 Essential (primary) hypertension: Secondary | ICD-10-CM | POA: Diagnosis not present

## 2022-11-25 DIAGNOSIS — H353111 Nonexudative age-related macular degeneration, right eye, early dry stage: Secondary | ICD-10-CM

## 2022-11-25 DIAGNOSIS — H353221 Exudative age-related macular degeneration, left eye, with active choroidal neovascularization: Secondary | ICD-10-CM | POA: Diagnosis not present

## 2022-11-25 DIAGNOSIS — Z961 Presence of intraocular lens: Secondary | ICD-10-CM

## 2022-11-25 DIAGNOSIS — H35033 Hypertensive retinopathy, bilateral: Secondary | ICD-10-CM

## 2022-11-25 MED ORDER — AFLIBERCEPT 2MG/0.05ML IZ SOLN FOR KALEIDOSCOPE
2.0000 mg | INTRAVITREAL | Status: AC | PRN
Start: 2022-11-25 — End: 2022-11-25
  Administered 2022-11-25: 2 mg via INTRAVITREAL

## 2022-12-19 ENCOUNTER — Ambulatory Visit: Payer: Medicare Other | Admitting: Orthopedic Surgery

## 2022-12-25 NOTE — Progress Notes (Signed)
[facility] - Clinic Note  12/26/2022     CHIEF COMPLAINT Patient presents for Retina Follow Up   HISTORY OF PRESENT ILLNESS: Shane Diaz is a 77 y.o. male who presents to the clinic today for:   HPI     Retina Follow Up   Patient presents with  Wet AMD.  In left eye.  This started 4 weeks ago.  I, the attending physician,  performed the HPI with the patient and updated documentation appropriately.        Comments   Patient here for 4 weeks retina follow up for exu ARMD OS. Patient states vision just woke up. Feels tired. Vision worse than last time. No eye pain.      Last edited by Rennis Chris, MD on 12/26/2022  3:23 PM.     Pt states he can't read anything, even when his glasses, he is still taking eplereone, but ran out yesterday  Referring physician: Ralene Ok, MD [id number] [place],  [place] [address]  HISTORICAL INFORMATION:   Selected notes from the MEDICAL RECORD NUMBER Dr. Luciana Axe pt -- transferring care due to insurance LEE:  Ocular Hx- PMH-    CURRENT MEDICATIONS: No current outpatient medications on file. (Ophthalmic Drugs)   No current facility-administered medications for this visit. (Ophthalmic Drugs)   Current Outpatient Medications (Other)  Medication Sig   amiodarone (PACERONE) 200 MG tablet TAKE ONE TABLET BY MOUTH DAILY   apixaban (ELIQUIS) 5 MG TABS tablet Take 1 tablet (5 mg total) by mouth 2 (two) times daily.   apixaban (ELIQUIS) 5 MG TABS tablet Take 1 tablet (5 mg total) by mouth 2 (two) times daily.   bumetanide (BUMEX) 2 MG tablet Take 2 tablets (4 mg total) by mouth 2 (two) times daily.   doxycycline (VIBRAMYCIN) 100 MG capsule Take 100 mg by mouth 2 (two) times daily.   Magnesium Oxide 400 MG CAPS Take 1 capsule (400 mg total) by mouth daily.   metoprolol succinate (TOPROL-XL) 50 MG 24 hr tablet TAKE ONE TABLET BY MOUTH DAILY WITH OR IMMEDIATELY FOLLOWING A MEAL   Potassium Chloride ER 20 MEQ TBCR Take 20  mEq by mouth 2 (two) times daily.   eplerenone (INSPRA) 25 MG tablet Take 1 tablet (25 mg total) by mouth daily.   nitroGLYCERIN (NITROSTAT) 0.4 MG SL tablet Place 1 tablet (0.4 mg total) under the tongue every 5 (five) minutes x 3 doses as needed for chest pain. (Patient not taking: Reported on 10/23/2022)   No current facility-administered medications for this visit. (Other)   REVIEW OF SYSTEMS: ROS   Positive for: Cardiovascular, Eyes, Respiratory Negative for: Constitutional, Gastrointestinal, Neurological, Skin, Genitourinary, Musculoskeletal, HENT, Endocrine, Psychiatric, Allergic/Imm, Heme/Lymph Last edited by Laddie Aquas, COA on 12/26/2022 10:20 AM.         ALLERGIES Allergies  Allergen Reactions   Diltiazem     rash   Amiodarone Rash   PAST MEDICAL HISTORY Past Medical History:  Diagnosis Date   Acute diastolic heart failure (HCC) 08/03/2020   Arthritis    Diverticulosis 05/2015   Mild, noted on colonosocpy   Fatty liver 05/14/2015   noted on [place] ABD   History of colon polyps 05/2014   sessile in ascending colon, pedunculated in sigmoid colon, diverticulosis   Hyperlipidemia    pt denies   Hypertension    not currently taking medications per MD   OSA (obstructive sleep apnea) 08/03/2020   Renal cyst 05/2014   Small,  Left, noted on [place] ABD   Umbilical hernia    Varicose veins    Past Surgical History:  Procedure Laterality Date   APPENDECTOMY     childhood   BIOPSY  07/11/2020   Procedure: BIOPSY;  Surgeon: Meryl Dare, MD;  Location: [facility] ENDOSCOPY;  Service: Endoscopy;;   CARDIOVERSION N/A 07/13/2020   Procedure: CARDIOVERSION;  Surgeon: Wendall Stade, MD;  Location: [facility] ENDOSCOPY;  Service: Cardiovascular;  Laterality: N/A;   CARDIOVERSION N/A 08/02/2020   Procedure: CARDIOVERSION;  Surgeon: Christell Constant, MD;  Location: [facility] ENDOSCOPY;  Service: Cardiovascular;  Laterality: N/A;   CARDIOVERSION N/A 12/12/2021   Procedure: CARDIOVERSION;   Surgeon: Pricilla Riffle, MD;  Location: [facility] ENDOSCOPY;  Service: Cardiovascular;  Laterality: N/A;   COLONOSCOPY W/ POLYPECTOMY  05/23/2014   COLONOSCOPY WITH PROPOFOL N/A 07/11/2020   Procedure: COLONOSCOPY WITH PROPOFOL;  Surgeon: Meryl Dare, MD;  Location: [facility] ENDOSCOPY;  Service: Endoscopy;  Laterality: N/A;   ESOPHAGOGASTRODUODENOSCOPY (EGD) WITH PROPOFOL N/A 07/11/2020   Procedure: ESOPHAGOGASTRODUODENOSCOPY (EGD) WITH PROPOFOL;  Surgeon: Meryl Dare, MD;  Location: [facility] ENDOSCOPY;  Service: Endoscopy;  Laterality: N/A;   TEE WITHOUT CARDIOVERSION N/A 07/13/2020   Procedure: TRANSESOPHAGEAL ECHOCARDIOGRAM (TEE);  Surgeon: Wendall Stade, MD;  Location: [facility] ENDOSCOPY;  Service: Cardiovascular;  Laterality: N/A;   UMBILICAL HERNIA REPAIR N/A 04/27/2018   Procedure: REPAIR UMBILICAL HERNIA ERAS PATHWAY;  Surgeon: Ovidio Kin, MD;  Location: [facility] ORS;  Service: General;  Laterality: N/A;   varicose veins     WRIST SURGERY     FAMILY HISTORY Family History  Problem Relation Age of Onset   Colon cancer Neg Hx    Esophageal cancer Neg Hx    Pancreatic cancer Neg Hx    Prostate cancer Neg Hx    Rectal cancer Neg Hx    Stomach cancer Neg Hx    SOCIAL HISTORY Social History   Tobacco Use   Smoking status: Never   Smokeless tobacco: Never  Vaping Use   Vaping Use: Never used  Substance Use Topics   Alcohol use: Yes    Alcohol/week: 0.0 standard drinks of alcohol    Comment: occassional wine, martini   Drug use: No       OPHTHALMIC EXAM:  Base Eye Exam     Visual Acuity (Snellen - Linear)       Right Left   Dist [place] 20/50 20/80 -2   Dist ph  20/40 -1 NI         Tonometry (Tonopen, 10:18 AM)       Right Left   Pressure 20 21         Pupils       Dark Light Shape React APD   Right 3 2 Round Brisk None   Left 3 2 Round Brisk None         Visual Fields (Counting fingers)       Left Right    Full Full         Extraocular Movement       Right  Left    Full, Ortho Full, Ortho         Neuro/Psych     Oriented x3: Yes   Mood/Affect: Normal         Dilation     Both eyes: 1.0% Mydriacyl, 2.5% Phenylephrine @ 10:18 AM           Slit Lamp and Fundus Exam     External Exam  Right Left   External Normal Normal         Slit Lamp Exam       Right Left   Lids/Lashes Dermatochalasis - upper lid, Meibomian gland dysfunction, mild Ptosis Dermatochalasis - upper lid, Meibomian gland dysfunction, mild Ptosis   Conjunctiva/Sclera White and quiet Mild Conjunctivochalasis inferiorly   Cornea trace PEE, well healed cataract wound well healed cataract wound   Anterior Chamber deep and clear deep and clear   Iris Round and dilated Round and dilated   Lens PC IOL in good position PC IOL in good position, trace Posterior capsular opacification   Anterior Vitreous Vitreous syneresis Normal         Fundus Exam       Right Left   Posterior Vitreous Posterior vitreous detachment Partial PVD   Disc mild pallor, Sharp rim, +PPA trace Pallor, Sharp rim, mild PPA   C/D Ratio 0.3 0.3   Macula Flat, Good foveal reflex, RPE mottling, No heme or edema, mild ERM Blunted foveal reflex, RPE mottling and clumping, focal CNV with central SRF -- improved, no heme   Vessels attenuated, mild tortuosity attenuated, Tortuous   Periphery Attached, focal DBH temporal periphery, mild reticular degeneration Attached, focal DBH temporal periphery           IMAGING AND PROCEDURES  Imaging and Procedures for 12/26/2022  OCT, Retina - OU - Both Eyes       Right Eye Quality was good. Scan locations included subfoveal. Central Foveal Thickness: 319. Progression has been stable. Findings include no IRF, no SRF, abnormal foveal contour, retinal drusen , epiretinal membrane, macular pucker, pigment epithelial detachment (ERM with blunting of foveal contour, early pucker, partial PVD, focal peripapillary PED ST to disc).   Left Eye Quality  was good. Scan locations included subfoveal. Central Foveal Thickness: 230. Progression has improved. Findings include no IRF, abnormal foveal contour, retinal drusen , subretinal hyper-reflective material, pigment epithelial detachment, subretinal fluid, vitreomacular adhesion (interval improvement in SRF with focal CNV/SRHM -- just trace fluid remains, partial PVD).   Notes *Images captured and stored on drive  Diagnosis / Impression:  OD: NFP; no IRF/SRF; ERM with blunting of foveal contour, early pucker, partial PVD, focal peripapillary PED ST to disc OS: interval improvement in SRF with focal CNV/SRHM -- just trace fluid remains, partial PVD  Clinical management:  See below  Abbreviations: NFP - Normal foveal profile. CME - cystoid macular edema. PED - pigment epithelial detachment. IRF - intraretinal fluid. SRF - subretinal fluid. EZ - ellipsoid zone. ERM - epiretinal membrane. ORA - outer retinal atrophy. ORT - outer retinal tubulation. SRHM - subretinal hyper-reflective material. IRHM - intraretinal hyper-reflective material      Intravitreal Injection, Pharmacologic Agent - OS - Left Eye       Time Out 12/26/2022. 10:35 AM. Confirmed correct patient, procedure, site, and patient consented.   Anesthesia Topical anesthesia was used. Anesthetic medications included Lidocaine 2%, Proparacaine 0.5%.   Procedure Preparation included 5% betadine to ocular surface, eyelid speculum. A (32g) needle was used.   Injection: 2 mg aflibercept 2 MG/0.05ML   Route: Intravitreal, Site: Left Eye   NDC: L6038910, Lot: [id number], Expiration date: 12/02/2023, Waste: 0 mL   Post-op Post injection exam found visual acuity of at least counting fingers. The patient tolerated the procedure well. There were no complications. The patient received written and verbal post procedure care education. Post injection medications were not given.  ASSESSMENT/PLAN:    ICD-10-CM   1.  Exudative age-related macular degeneration of left eye with active choroidal neovascularization (HCC)  H35.3221 OCT, Retina - OU - Both Eyes    Intravitreal Injection, Pharmacologic Agent - OS - Left Eye    aflibercept (EYLEA) SOLN 2 mg    2. 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 Essential hypertension  I10     4. Hypertensive retinopathy of both eyes  H35.033     5. Pseudophakia, both eyes  Z96.1      Exudative age related macular degeneration, OS  - previously followed with Dr. Luciana Axe  - s/p IVA OS x2, last injection mid December -- ~6 wks  - s/p IVA OS #3 (01.24.24), #4 (02.21.24) -- IVA resistance  - s/p IVE OS #1 (03.26.24) #2 (04.23.24) - FA (01.24.24) shows Mild central staining, minimal leakage, no smokestack or dot blot pattern  - OCT shows interval improvement in SRF with focal CNV/SRHM -- just trace fluid remains, partial PVD at 4 wks  - recommend IVE OS #3 today, 05.24.24 w/ f/u in 4 wks - pt wishes to proceed with injection - RBA of procedure discussed, questions answered - IVA informed consent obtained and signed, 01.24.24 (OS) - IVE informed consent obtained and signed, 03.26.24 - see procedure note  - cont eplerenone 25mg  once a day for possible CSCR component -- pt has been out for 2 days  - f/u in 4 wks -- DFE/OCT, possible injection  2. Age related macular degeneration, non-exudative, right eye  - early stage w/ minimal drusen  - The incidence, anatomy, and pathology of dry AMD, risk of progression, and the AREDS and AREDS 2 study including smoking risks discussed with patient.  - Recommend amsler grid monitoring  3,4. Hypertensive retinopathy OU - discussed importance of tight BP control - continue to monitor  5. Pseudophakia OU  - s/p CE/IOL  - IOL in good position, doing well  - continue to monitor  Ophthalmic Meds Ordered this visit:  Meds ordered this encounter  Medications   eplerenone  (INSPRA) 25 MG tablet    Sig: Take 1 tablet (25 mg total) by mouth daily.    Dispense:  30 tablet    Refill:  6   aflibercept (EYLEA) SOLN 2 mg     Return in about 4 weeks (around 01/23/2023) for f/u exu ARMD OS, DFE, OCT.  There are no Patient Instructions on file for this visit.   This document serves as a record of services personally performed by Karie Chimera, MD, PhD. It was created on their behalf by Berlin Hun COT, an ophthalmic technician. The creation of this record is the provider's dictation and/or activities during the visit.    Electronically signed by: Berlin Hun COT 05.23.20242:15 AM  This document serves as a record of services personally performed by Karie Chimera, MD, PhD. It was created on their behalf by Glee Arvin. Manson Passey, OA an ophthalmic technician. The creation of this record is the provider's dictation and/or activities during the visit.    Electronically signed by: Glee Arvin. Manson Passey, [place] 05.24.2024 2:15 AM  Karie Chimera, M.D., Ph.D. Diseases & Surgery of the Retina and Vitreous [facility] 12/26/2022   I have reviewed the above documentation for accuracy and completeness, and I agree with the above. Karie Chimera, M.D., Ph.D. 12/29/22 2:16 AM   Abbreviations: M myopia (nearsighted); A astigmatism;  H hyperopia (farsighted); P presbyopia; Mrx spectacle prescription;  CTL contact lenses; OD right eye; OS left eye; OU both eyes  XT exotropia; ET esotropia; PEK punctate epithelial keratitis; PEE punctate epithelial erosions; DES dry eye syndrome; MGD meibomian gland dysfunction; ATs artificial tears; PFAT's preservative free artificial tears; NSC nuclear sclerotic cataract; PSC posterior subcapsular cataract; ERM epi-retinal membrane; PVD posterior vitreous detachment; RD retinal detachment; DM diabetes mellitus; DR diabetic retinopathy; NPDR non-proliferative diabetic retinopathy; PDR proliferative diabetic retinopathy;  CSME clinically significant macular edema; DME diabetic macular edema; dbh dot blot hemorrhages; CWS cotton wool spot; POAG primary open angle glaucoma; C/D cup-to-disc ratio; HVF humphrey visual field; GVF goldmann visual field; OCT optical coherence tomography; IOP intraocular pressure; BRVO Branch retinal vein occlusion; CRVO central retinal vein occlusion; CRAO central retinal artery occlusion; BRAO branch retinal artery occlusion; RT retinal tear; SB scleral buckle; PPV pars plana vitrectomy; VH Vitreous hemorrhage; PRP panretinal laser photocoagulation; IVK intravitreal kenalog; VMT vitreomacular traction; MH Macular hole;  NVD neovascularization of the disc; NVE neovascularization elsewhere; AREDS age related eye disease study; ARMD age related macular degeneration; POAG primary open angle glaucoma; EBMD epithelial/anterior basement membrane dystrophy; ACIOL anterior chamber intraocular lens; IOL intraocular lens; PCIOL posterior chamber intraocular lens; Phaco/IOL phacoemulsification with intraocular lens placement; PRK photorefractive keratectomy; LASIK laser assisted in situ keratomileusis; HTN hypertension; DM diabetes mellitus; COPD chronic obstructive pulmonary disease

## 2022-12-26 ENCOUNTER — Encounter (INDEPENDENT_AMBULATORY_CARE_PROVIDER_SITE_OTHER): Payer: Self-pay | Admitting: Ophthalmology

## 2022-12-26 ENCOUNTER — Ambulatory Visit (INDEPENDENT_AMBULATORY_CARE_PROVIDER_SITE_OTHER): Payer: Medicare Other | Admitting: Ophthalmology

## 2022-12-26 DIAGNOSIS — Z961 Presence of intraocular lens: Secondary | ICD-10-CM

## 2022-12-26 DIAGNOSIS — I10 Essential (primary) hypertension: Secondary | ICD-10-CM | POA: Diagnosis not present

## 2022-12-26 DIAGNOSIS — H353221 Exudative age-related macular degeneration, left eye, with active choroidal neovascularization: Secondary | ICD-10-CM | POA: Diagnosis not present

## 2022-12-26 DIAGNOSIS — H35033 Hypertensive retinopathy, bilateral: Secondary | ICD-10-CM | POA: Diagnosis not present

## 2022-12-26 DIAGNOSIS — H353111 Nonexudative age-related macular degeneration, right eye, early dry stage: Secondary | ICD-10-CM

## 2022-12-26 MED ORDER — EPLERENONE 25 MG PO TABS: 25.0000 mg | ORAL_TABLET | Freq: Every day | ORAL | 6 refills | Status: DC

## 2022-12-26 MED ORDER — AFLIBERCEPT 2MG/0.05ML IZ SOLN FOR KALEIDOSCOPE
2.0000 mg | INTRAVITREAL | Status: AC | PRN
Start: 2022-12-26 — End: 2022-12-26
  Administered 2022-12-26: 2 mg via INTRAVITREAL

## 2023-01-10 ENCOUNTER — Other Ambulatory Visit: Payer: Self-pay | Admitting: Cardiovascular Disease

## 2023-01-23 ENCOUNTER — Encounter (INDEPENDENT_AMBULATORY_CARE_PROVIDER_SITE_OTHER): Payer: Medicare Other | Admitting: Ophthalmology

## 2023-01-23 ENCOUNTER — Encounter (INDEPENDENT_AMBULATORY_CARE_PROVIDER_SITE_OTHER): Payer: Self-pay | Admitting: Ophthalmology

## 2023-01-23 ENCOUNTER — Ambulatory Visit (INDEPENDENT_AMBULATORY_CARE_PROVIDER_SITE_OTHER): Payer: Medicare Other | Admitting: Ophthalmology

## 2023-01-23 DIAGNOSIS — I10 Essential (primary) hypertension: Secondary | ICD-10-CM

## 2023-01-23 DIAGNOSIS — H35033 Hypertensive retinopathy, bilateral: Secondary | ICD-10-CM | POA: Diagnosis not present

## 2023-01-23 DIAGNOSIS — Z961 Presence of intraocular lens: Secondary | ICD-10-CM

## 2023-01-23 DIAGNOSIS — H353221 Exudative age-related macular degeneration, left eye, with active choroidal neovascularization: Secondary | ICD-10-CM

## 2023-01-23 DIAGNOSIS — H43822 Vitreomacular adhesion, left eye: Secondary | ICD-10-CM

## 2023-01-23 DIAGNOSIS — H353131 Nonexudative age-related macular degeneration, bilateral, early dry stage: Secondary | ICD-10-CM

## 2023-01-23 DIAGNOSIS — H353111 Nonexudative age-related macular degeneration, right eye, early dry stage: Secondary | ICD-10-CM | POA: Diagnosis not present

## 2023-01-23 DIAGNOSIS — H43811 Vitreous degeneration, right eye: Secondary | ICD-10-CM

## 2023-01-23 MED ORDER — AFLIBERCEPT 2MG/0.05ML IZ SOLN FOR KALEIDOSCOPE
2.0000 mg | INTRAVITREAL | Status: AC | PRN
Start: 2023-01-23 — End: 2023-01-23
  Administered 2023-01-23: 2 mg via INTRAVITREAL

## 2023-01-23 NOTE — Progress Notes (Signed)
[facility] - Clinic Note  01/23/2023     CHIEF COMPLAINT Patient presents for Retina Follow Up   HISTORY OF PRESENT ILLNESS: Shane Diaz is a 77 y.o. male who presents to the clinic today for:   HPI     Retina Follow Up   Patient presents with  Wet AMD.  In left eye.  This started 4 weeks ago.  I, the attending physician,  performed the HPI with the patient and updated documentation appropriately.        Comments   Patient here for 4 weeks retina follow up for exu ARMD OS. Patient states vision can see clearly. Not too bad. No eye pain.       Last edited by Rennis Chris, MD on 01/23/2023 12:26 PM.    Pt feels like vision has improved  Referring physician: Ralene Ok, MD [address] [facility] [address],  [place] [address]  HISTORICAL INFORMATION:   Selected notes from the MEDICAL RECORD NUMBER Dr. Luciana Axe pt -- transferring care due to insurance LEE:  Ocular Hx- PMH-    CURRENT MEDICATIONS: No current outpatient medications on file. (Ophthalmic Drugs)   No current facility-administered medications for this visit. (Ophthalmic Drugs)   Current Outpatient Medications (Other)  Medication Sig   amiodarone (PACERONE) 200 MG tablet TAKE ONE TABLET BY MOUTH DAILY   apixaban (ELIQUIS) 5 MG TABS tablet Take 1 tablet (5 mg total) by mouth 2 (two) times daily.   apixaban (ELIQUIS) 5 MG TABS tablet Take 1 tablet (5 mg total) by mouth 2 (two) times daily.   bumetanide (BUMEX) 2 MG tablet TAKE 2 TABLETS BY MOUTH TWICE A DAY   doxycycline (VIBRAMYCIN) 100 MG capsule Take 100 mg by mouth 2 (two) times daily.   eplerenone (INSPRA) 25 MG tablet Take 1 tablet (25 mg total) by mouth daily.   Magnesium Oxide 400 MG CAPS Take 1 capsule (400 mg total) by mouth daily.   metoprolol succinate (TOPROL-XL) 50 MG 24 hr tablet TAKE ONE TABLET BY MOUTH DAILY WITH OR IMMEDIATELY FOLLOWING A MEAL   Potassium Chloride ER 20 MEQ TBCR Take 20 mEq by mouth 2 (two) times daily.    nitroGLYCERIN (NITROSTAT) 0.4 MG SL tablet Place 1 tablet (0.4 mg total) under the tongue every 5 (five) minutes x 3 doses as needed for chest pain. (Patient not taking: Reported on 10/23/2022)   No current facility-administered medications for this visit. (Other)   REVIEW OF SYSTEMS: ROS   Positive for: Cardiovascular, Eyes, Respiratory Negative for: Constitutional, Gastrointestinal, Neurological, Skin, Genitourinary, Musculoskeletal, HENT, Endocrine, Psychiatric, Allergic/Imm, Heme/Lymph Last edited by Laddie Aquas, COA on 01/23/2023 10:11 AM.     ALLERGIES Allergies  Allergen Reactions   Diltiazem     rash   Amiodarone Rash   PAST MEDICAL HISTORY Past Medical History:  Diagnosis Date   Acute diastolic heart failure (HCC) 08/03/2020   Arthritis    Diverticulosis 05/2015   Mild, noted on colonosocpy   Fatty liver 05/14/2015   noted on [place] ABD   History of colon polyps 05/2014   sessile in ascending colon, pedunculated in sigmoid colon, diverticulosis   Hyperlipidemia    pt denies   Hypertension    not currently taking medications per MD   OSA (obstructive sleep apnea) 08/03/2020   Renal cyst 05/2014   Small, Left, noted on [place] ABD   Umbilical hernia    Varicose veins    Past Surgical History:  Procedure Laterality Date  APPENDECTOMY     childhood   BIOPSY  07/11/2020   Procedure: BIOPSY;  Surgeon: Meryl Dare, MD;  Location: [facility] ENDOSCOPY;  Service: Endoscopy;;   CARDIOVERSION N/A 07/13/2020   Procedure: CARDIOVERSION;  Surgeon: Wendall Stade, MD;  Location: [facility] ENDOSCOPY;  Service: Cardiovascular;  Laterality: N/A;   CARDIOVERSION N/A 08/02/2020   Procedure: CARDIOVERSION;  Surgeon: Christell Constant, MD;  Location: [facility] ENDOSCOPY;  Service: Cardiovascular;  Laterality: N/A;   CARDIOVERSION N/A 12/12/2021   Procedure: CARDIOVERSION;  Surgeon: Pricilla Riffle, MD;  Location: [facility] ENDOSCOPY;  Service: Cardiovascular;  Laterality: N/A;   COLONOSCOPY W/  POLYPECTOMY  05/23/2014   COLONOSCOPY WITH PROPOFOL N/A 07/11/2020   Procedure: COLONOSCOPY WITH PROPOFOL;  Surgeon: Meryl Dare, MD;  Location: [facility] ENDOSCOPY;  Service: Endoscopy;  Laterality: N/A;   ESOPHAGOGASTRODUODENOSCOPY (EGD) WITH PROPOFOL N/A 07/11/2020   Procedure: ESOPHAGOGASTRODUODENOSCOPY (EGD) WITH PROPOFOL;  Surgeon: Meryl Dare, MD;  Location: [facility] ENDOSCOPY;  Service: Endoscopy;  Laterality: N/A;   TEE WITHOUT CARDIOVERSION N/A 07/13/2020   Procedure: TRANSESOPHAGEAL ECHOCARDIOGRAM (TEE);  Surgeon: Wendall Stade, MD;  Location: [facility] ENDOSCOPY;  Service: Cardiovascular;  Laterality: N/A;   UMBILICAL HERNIA REPAIR N/A 04/27/2018   Procedure: REPAIR UMBILICAL HERNIA ERAS PATHWAY;  Surgeon: Ovidio Kin, MD;  Location: [facility] ORS;  Service: General;  Laterality: N/A;   varicose veins     WRIST SURGERY     FAMILY HISTORY Family History  Problem Relation Age of Onset   Colon cancer Neg Hx    Esophageal cancer Neg Hx    Pancreatic cancer Neg Hx    Prostate cancer Neg Hx    Rectal cancer Neg Hx    Stomach cancer Neg Hx    SOCIAL HISTORY Social History   Tobacco Use   Smoking status: Never   Smokeless tobacco: Never  Vaping Use   Vaping Use: Never used  Substance Use Topics   Alcohol use: Yes    Alcohol/week: 0.0 standard drinks of alcohol    Comment: occassional wine, martini   Drug use: No       OPHTHALMIC EXAM:  Base Eye Exam     Visual Acuity (Snellen - Linear)       Right Left   Dist [place] 20/40 -2 20/70 +2   Dist ph [place] NI NI         Tonometry (Tonopen, 10:08 AM)       Right Left   Pressure 16 16         Pupils       Dark Light Shape React APD   Right 3 2 Round Brisk None   Left 3 2 Round Brisk None         Visual Fields (Counting fingers)       Left Right    Full Full         Extraocular Movement       Right Left    Full, Ortho Full, Ortho         Neuro/Psych     Oriented x3: Yes   Mood/Affect: Normal          Dilation     Both eyes: 1.0% Mydriacyl, 2.5% Phenylephrine @ 10:08 AM           Slit Lamp and Fundus Exam     External Exam       Right Left   External Normal Normal         Slit Lamp Exam  Right Left   Lids/Lashes Dermatochalasis - upper lid, Meibomian gland dysfunction, mild Ptosis Dermatochalasis - upper lid, Meibomian gland dysfunction, mild Ptosis   Conjunctiva/Sclera White and quiet Mild Conjunctivochalasis inferiorly   Cornea trace PEE, well healed cataract wound well healed cataract wound   Anterior Chamber deep and clear deep and clear   Iris Round and dilated Round and dilated   Lens PC IOL in good position PC IOL in good position, trace Posterior capsular opacification   Anterior Vitreous Vitreous syneresis Normal         Fundus Exam       Right Left   Posterior Vitreous Posterior vitreous detachment Partial PVD   Disc mild pallor, Sharp rim, +PPA trace Pallor, Sharp rim, mild PPA   C/D Ratio 0.3 0.3   Macula Flat, Good foveal reflex, RPE mottling, No heme or edema, mild ERM Blunted foveal reflex, RPE mottling and clumping, focal CNV with central SRF -- improved / resolved, no heme   Vessels mild attenuation, mild tortuosity attenuated, Tortuous   Periphery Attached, focal DBH temporal periphery, mild reticular degeneration Attached, focal DBH temporal periphery           IMAGING AND PROCEDURES  Imaging and Procedures for 01/23/2023  OCT, Retina - OU - Both Eyes       Right Eye Quality was good. Scan locations included subfoveal. Central Foveal Thickness: 325. Progression has been stable. Findings include no IRF, no SRF, abnormal foveal contour, retinal drusen , epiretinal membrane, macular pucker, pigment epithelial detachment (ERM with blunting of foveal contour, early pucker, partial PVD, focal peripapillary PED ST to disc).   Left Eye Quality was good. Scan locations included subfoveal. Central Foveal Thickness: 203. Progression has  improved. Findings include no IRF, no SRF, abnormal foveal contour, retinal drusen , subretinal hyper-reflective material, pigment epithelial detachment, vitreomacular adhesion (interval resolution of SRF overlying focal CNV/SRHM ).   Notes *Images captured and stored on drive  Diagnosis / Impression:  OD: NFP; no IRF/SRF; ERM with blunting of foveal contour, early pucker, partial PVD, focal peripapillary PED ST to disc OS: interval resolution of SRF overlying focal CNV/SRHM   Clinical management:  See below  Abbreviations: NFP - Normal foveal profile. CME - cystoid macular edema. PED - pigment epithelial detachment. IRF - intraretinal fluid. SRF - subretinal fluid. EZ - ellipsoid zone. ERM - epiretinal membrane. ORA - outer retinal atrophy. ORT - outer retinal tubulation. SRHM - subretinal hyper-reflective material. IRHM - intraretinal hyper-reflective material      Intravitreal Injection, Pharmacologic Agent - OS - Left Eye       Time Out 01/23/2023. 10:53 AM. Confirmed correct patient, procedure, site, and patient consented.   Anesthesia Topical anesthesia was used. Anesthetic medications included Lidocaine 2%, Proparacaine 0.5%.   Procedure Preparation included 5% betadine to ocular surface, eyelid speculum. A (32g) needle was used.   Injection: 2 mg aflibercept 2 MG/0.05ML   Route: Intravitreal, Site: Left Eye   NDC: L6038910, Lot: [id number], Expiration date: 01/02/2024, Waste: 0 mL   Post-op Post injection exam found visual acuity of at least counting fingers. The patient tolerated the procedure well. There were no complications. The patient received written and verbal post procedure care education. Post injection medications were not given.            ASSESSMENT/PLAN:    ICD-10-CM   1. Exudative age-related macular degeneration of left eye with active choroidal neovascularization (HCC)  H35.3221 OCT, Retina - OU - Both Eyes  Intravitreal Injection,  Pharmacologic Agent - OS - Left Eye    aflibercept (EYLEA) SOLN 2 mg    2. 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 Essential hypertension  I10     4. Hypertensive retinopathy of both eyes  H35.033     5. Pseudophakia, both eyes  Z96.1     6. Early stage nonexudative age-related macular degeneration of both eyes  H35.3131     7. Vitreomacular adhesion of left eye  H43.822     8. Posterior vitreous detachment of right eye  H43.811       Exudative age related macular degeneration, OS  - previously followed with Dr. Luciana Axe  - s/p IVA OS x2, last injection mid December -- ~6 wks  - s/p IVA OS #3 (01.24.24), #4 (02.21.24) -- IVA resistance  - s/p IVE OS #1 (03.26.24) #2 (04.23.24), #3 (05.24.24) - FA (01.24.24) shows Mild central staining, minimal leakage, no smokestack or dot blot pattern  - OCT shows interval resolution of SRF overlying focal CNV/SRHM at 4 wks  - recommend IVE OS #4 today, 06.21.24 w/ f/u in 4-5 wks - pt wishes to proceed with injection - RBA of procedure discussed, questions answered - IVA informed consent obtained and signed, 01.24.24 (OS) - IVE informed consent obtained and signed, 03.26.24 - see procedure note  - cont eplerenone 25mg  once a day for possible CSCR component -- pt has been out for 2 days  - f/u in 5 wks -- DFE/OCT, possible injection  2. Age related macular degeneration, non-exudative, right eye  - early stage w/ minimal drusen  - The incidence, anatomy, and pathology of dry AMD, risk of progression, and the AREDS and AREDS 2 study including smoking risks discussed with patient.  - Recommend amsler grid monitoring  3,4. Hypertensive retinopathy OU - discussed importance of tight BP control - continue to monitor  5. Pseudophakia OU  - s/p CE/IOL  - IOL in good position, doing well  - continue to monitor  Ophthalmic Meds Ordered this visit:  Meds ordered this encounter  Medications    aflibercept (EYLEA) SOLN 2 mg     Return in 5 weeks (on 02/27/2023) for exu ARMD OS, DFE, OCT, Possible Injxn.  There are no Patient Instructions on file for this visit.   This document serves as a record of services personally performed by Karie Chimera, MD, PhD. It was created on their behalf by Glee Arvin. Manson Passey, OA an ophthalmic technician. The creation of this record is the provider's dictation and/or activities during the visit.    Electronically signed by: Glee Arvin. Kristopher Oppenheim 06.21.2024 12:28 PM  Karie Chimera, M.D., Ph.D. Diseases & Surgery of the Retina and Vitreous [facility] 01/23/2023   I have reviewed the above documentation for accuracy and completeness, and I agree with the above. Karie Chimera, M.D., Ph.D. 01/23/23 12:28 PM   Abbreviations: M myopia (nearsighted); A astigmatism; H hyperopia (farsighted); P presbyopia; Mrx spectacle prescription;  CTL contact lenses; OD right eye; OS left eye; OU both eyes  XT exotropia; ET esotropia; PEK punctate epithelial keratitis; PEE punctate epithelial erosions; DES dry eye syndrome; MGD meibomian gland dysfunction; ATs artificial tears; PFAT's preservative free artificial tears; NSC nuclear sclerotic cataract; PSC posterior subcapsular cataract; ERM epi-retinal membrane; PVD posterior vitreous detachment; RD retinal detachment; DM diabetes mellitus; DR diabetic retinopathy; NPDR non-proliferative diabetic retinopathy; PDR proliferative diabetic retinopathy; CSME  clinically significant macular edema; DME diabetic macular edema; dbh dot blot hemorrhages; CWS cotton wool spot; POAG primary open angle glaucoma; C/D cup-to-disc ratio; HVF humphrey visual field; GVF goldmann visual field; OCT optical coherence tomography; IOP intraocular pressure; BRVO Branch retinal vein occlusion; CRVO central retinal vein occlusion; CRAO central retinal artery occlusion; BRAO branch retinal artery occlusion; RT retinal tear; SB  scleral buckle; PPV pars plana vitrectomy; VH Vitreous hemorrhage; PRP panretinal laser photocoagulation; IVK intravitreal kenalog; VMT vitreomacular traction; MH Macular hole;  NVD neovascularization of the disc; NVE neovascularization elsewhere; AREDS age related eye disease study; ARMD age related macular degeneration; POAG primary open angle glaucoma; EBMD epithelial/anterior basement membrane dystrophy; ACIOL anterior chamber intraocular lens; IOL intraocular lens; PCIOL posterior chamber intraocular lens; Phaco/IOL phacoemulsification with intraocular lens placement; PRK photorefractive keratectomy; LASIK laser assisted in situ keratomileusis; HTN hypertension; DM diabetes mellitus; COPD chronic obstructive pulmonary disease

## 2023-02-06 ENCOUNTER — Emergency Department (HOSPITAL_COMMUNITY): Payer: Medicare Other

## 2023-02-06 ENCOUNTER — Other Ambulatory Visit: Payer: Self-pay

## 2023-02-06 ENCOUNTER — Encounter (HOSPITAL_COMMUNITY): Payer: Self-pay

## 2023-02-06 ENCOUNTER — Inpatient Hospital Stay (HOSPITAL_COMMUNITY)
Admission: EM | Admit: 2023-02-06 | Discharge: 2023-02-12 | DRG: 291 | Disposition: A | Discharge status: Home / Self Care | Payer: Medicare Other | Attending: Internal Medicine | Admitting: Internal Medicine

## 2023-02-06 DIAGNOSIS — J9612 Chronic respiratory failure with hypercapnia: Secondary | ICD-10-CM | POA: Diagnosis present

## 2023-02-06 DIAGNOSIS — E039 Hypothyroidism, unspecified: Secondary | ICD-10-CM | POA: Diagnosis not present

## 2023-02-06 DIAGNOSIS — I452 Bifascicular block: Secondary | ICD-10-CM | POA: Diagnosis present

## 2023-02-06 DIAGNOSIS — I4819 Other persistent atrial fibrillation: Secondary | ICD-10-CM | POA: Diagnosis present

## 2023-02-06 DIAGNOSIS — T148XXA Other injury of unspecified body region, initial encounter: Secondary | ICD-10-CM | POA: Diagnosis present

## 2023-02-06 DIAGNOSIS — I2729 Other secondary pulmonary hypertension: Secondary | ICD-10-CM | POA: Diagnosis present

## 2023-02-06 DIAGNOSIS — I890 Lymphedema, not elsewhere classified: Secondary | ICD-10-CM | POA: Diagnosis present

## 2023-02-06 DIAGNOSIS — J9622 Acute and chronic respiratory failure with hypercapnia: Secondary | ICD-10-CM | POA: Diagnosis present

## 2023-02-06 DIAGNOSIS — Z888 Allergy status to other drugs, medicaments and biological substances status: Secondary | ICD-10-CM

## 2023-02-06 DIAGNOSIS — Z6841 Body Mass Index (BMI) 40.0 and over, adult: Secondary | ICD-10-CM

## 2023-02-06 DIAGNOSIS — J9601 Acute respiratory failure with hypoxia: Secondary | ICD-10-CM | POA: Diagnosis present

## 2023-02-06 DIAGNOSIS — I739 Peripheral vascular disease, unspecified: Secondary | ICD-10-CM | POA: Diagnosis present

## 2023-02-06 DIAGNOSIS — R7989 Other specified abnormal findings of blood chemistry: Secondary | ICD-10-CM | POA: Diagnosis not present

## 2023-02-06 DIAGNOSIS — I2781 Cor pulmonale (chronic): Secondary | ICD-10-CM | POA: Diagnosis present

## 2023-02-06 DIAGNOSIS — N179 Acute kidney failure, unspecified: Secondary | ICD-10-CM | POA: Diagnosis present

## 2023-02-06 DIAGNOSIS — Z7901 Long term (current) use of anticoagulants: Secondary | ICD-10-CM

## 2023-02-06 DIAGNOSIS — Z86718 Personal history of other venous thrombosis and embolism: Secondary | ICD-10-CM

## 2023-02-06 DIAGNOSIS — I5033 Acute on chronic diastolic (congestive) heart failure: Secondary | ICD-10-CM | POA: Diagnosis not present

## 2023-02-06 DIAGNOSIS — I130 Hypertensive heart and chronic kidney disease with heart failure and stage 1 through stage 4 chronic kidney disease, or unspecified chronic kidney disease: Principal | ICD-10-CM | POA: Diagnosis present

## 2023-02-06 DIAGNOSIS — I959 Hypotension, unspecified: Secondary | ICD-10-CM | POA: Diagnosis present

## 2023-02-06 DIAGNOSIS — J9602 Acute respiratory failure with hypercapnia: Secondary | ICD-10-CM | POA: Diagnosis not present

## 2023-02-06 DIAGNOSIS — I441 Atrioventricular block, second degree: Secondary | ICD-10-CM | POA: Diagnosis present

## 2023-02-06 DIAGNOSIS — I2489 Other forms of acute ischemic heart disease: Secondary | ICD-10-CM | POA: Diagnosis present

## 2023-02-06 DIAGNOSIS — G9341 Metabolic encephalopathy: Secondary | ICD-10-CM | POA: Diagnosis not present

## 2023-02-06 DIAGNOSIS — I4430 Unspecified atrioventricular block: Secondary | ICD-10-CM | POA: Diagnosis not present

## 2023-02-06 DIAGNOSIS — L97919 Non-pressure chronic ulcer of unspecified part of right lower leg with unspecified severity: Secondary | ICD-10-CM | POA: Diagnosis present

## 2023-02-06 DIAGNOSIS — I50813 Acute on chronic right heart failure: Secondary | ICD-10-CM | POA: Diagnosis not present

## 2023-02-06 DIAGNOSIS — E878 Other disorders of electrolyte and fluid balance, not elsewhere classified: Secondary | ICD-10-CM | POA: Diagnosis present

## 2023-02-06 DIAGNOSIS — E66813 Obesity, class 3: Secondary | ICD-10-CM | POA: Diagnosis present

## 2023-02-06 DIAGNOSIS — E875 Hyperkalemia: Secondary | ICD-10-CM | POA: Diagnosis present

## 2023-02-06 DIAGNOSIS — Z8679 Personal history of other diseases of the circulatory system: Secondary | ICD-10-CM

## 2023-02-06 DIAGNOSIS — Z79899 Other long term (current) drug therapy: Secondary | ICD-10-CM

## 2023-02-06 DIAGNOSIS — I872 Venous insufficiency (chronic) (peripheral): Secondary | ICD-10-CM

## 2023-02-06 DIAGNOSIS — I4892 Unspecified atrial flutter: Secondary | ICD-10-CM | POA: Diagnosis not present

## 2023-02-06 DIAGNOSIS — L97929 Non-pressure chronic ulcer of unspecified part of left lower leg with unspecified severity: Secondary | ICD-10-CM | POA: Diagnosis present

## 2023-02-06 DIAGNOSIS — K760 Fatty (change of) liver, not elsewhere classified: Secondary | ICD-10-CM | POA: Diagnosis present

## 2023-02-06 DIAGNOSIS — I50810 Right heart failure, unspecified: Secondary | ICD-10-CM | POA: Insufficient documentation

## 2023-02-06 DIAGNOSIS — Z9981 Dependence on supplemental oxygen: Secondary | ICD-10-CM

## 2023-02-06 DIAGNOSIS — E662 Morbid (severe) obesity with alveolar hypoventilation: Secondary | ICD-10-CM | POA: Diagnosis not present

## 2023-02-06 DIAGNOSIS — R0602 Shortness of breath: Secondary | ICD-10-CM | POA: Diagnosis present

## 2023-02-06 DIAGNOSIS — I509 Heart failure, unspecified: Secondary | ICD-10-CM

## 2023-02-06 DIAGNOSIS — N1832 Chronic kidney disease, stage 3b: Secondary | ICD-10-CM

## 2023-02-06 DIAGNOSIS — E871 Hypo-osmolality and hyponatremia: Secondary | ICD-10-CM | POA: Diagnosis present

## 2023-02-06 DIAGNOSIS — J9621 Acute and chronic respiratory failure with hypoxia: Secondary | ICD-10-CM | POA: Diagnosis present

## 2023-02-06 DIAGNOSIS — I482 Chronic atrial fibrillation, unspecified: Secondary | ICD-10-CM | POA: Diagnosis present

## 2023-02-06 DIAGNOSIS — I480 Paroxysmal atrial fibrillation: Secondary | ICD-10-CM | POA: Diagnosis not present

## 2023-02-06 DIAGNOSIS — I5082 Biventricular heart failure: Secondary | ICD-10-CM | POA: Diagnosis present

## 2023-02-06 DIAGNOSIS — E785 Hyperlipidemia, unspecified: Secondary | ICD-10-CM | POA: Diagnosis present

## 2023-02-06 DIAGNOSIS — I5031 Acute diastolic (congestive) heart failure: Secondary | ICD-10-CM | POA: Diagnosis not present

## 2023-02-06 LAB — I-STAT ARTERIAL BLOOD GAS, ED
Acid-Base Excess: 15 mmol/L — ABNORMAL HIGH (ref 0.0–2.0)
Acid-Base Excess: 14 mmol/L — ABNORMAL HIGH (ref 0.0–2.0)
Bicarbonate: 46.6 mmol/L — ABNORMAL HIGH (ref 20.0–28.0)
Bicarbonate: 47.1 mmol/L — ABNORMAL HIGH (ref 20.0–28.0)
Calcium, Ion: 1.17 mmol/L (ref 1.15–1.40)
Calcium, Ion: 1.18 mmol/L (ref 1.15–1.40)
HCT: 55 % — ABNORMAL HIGH (ref 39.0–52.0)
HCT: 55 % — ABNORMAL HIGH (ref 39.0–52.0)
Hemoglobin: 18.7 g/dL — ABNORMAL HIGH (ref 13.0–17.0)
Hemoglobin: 18.7 g/dL — ABNORMAL HIGH (ref 13.0–17.0)
O2 Saturation: 97 %
O2 Saturation: 95 %
Patient temperature: 98.7
Patient temperature: 98.5
Potassium: 4.2 mmol/L (ref 3.5–5.1)
Potassium: 4.2 mmol/L (ref 3.5–5.1)
Sodium: 127 mmol/L — ABNORMAL LOW (ref 135–145)
Sodium: 130 mmol/L — ABNORMAL LOW (ref 135–145)
TCO2: 49 mmol/L — ABNORMAL HIGH (ref 22–32)
TCO2: 50 mmol/L — ABNORMAL HIGH (ref 22–32)
pCO2 arterial: 85.7 mmHg (ref 32–48)
pCO2 arterial: 94.8 mmHg (ref 32–48)
pH, Arterial: 7.343 — ABNORMAL LOW (ref 7.35–7.45)
pH, Arterial: 7.304 — ABNORMAL LOW (ref 7.35–7.45)
pO2, Arterial: 99 mmHg (ref 83–108)
pO2, Arterial: 87 mmHg (ref 83–108)

## 2023-02-06 LAB — I-STAT VENOUS BLOOD GAS, ED
Acid-Base Excess: 20 mmol/L — ABNORMAL HIGH (ref 0.0–2.0)
Bicarbonate: 51.9 mmol/L — ABNORMAL HIGH (ref 20.0–28.0)
Calcium, Ion: 1.05 mmol/L — ABNORMAL LOW (ref 1.15–1.40)
HCT: 54 % — ABNORMAL HIGH (ref 39.0–52.0)
Hemoglobin: 18.4 g/dL — ABNORMAL HIGH (ref 13.0–17.0)
O2 Saturation: 81 %
Potassium: 4.7 mmol/L (ref 3.5–5.1)
Sodium: 129 mmol/L — ABNORMAL LOW (ref 135–145)
TCO2: >50 mmol/L — ABNORMAL HIGH (ref 22–32)
pCO2, Ven: 87.2 mmHg (ref 44–60)
pH, Ven: 7.382 (ref 7.25–7.43)
pO2, Ven: 50 mmHg — ABNORMAL HIGH (ref 32–45)

## 2023-02-06 LAB — CBG MONITORING, ED: Glucose-Capillary: 86 mg/dL (ref 70–99)

## 2023-02-06 LAB — BASIC METABOLIC PANEL
Anion gap: 10 (ref 5–15)
BUN: 12 mg/dL (ref 8–23)
CO2: 42 mmol/L — ABNORMAL HIGH (ref 22–32)
Calcium: 8.9 mg/dL (ref 8.9–10.3)
Chloride: 80 mmol/L — ABNORMAL LOW (ref 98–111)
Creatinine, Ser: 1.76 mg/dL — ABNORMAL HIGH (ref 0.61–1.24)
GFR, Estimated: 39 mL/min — ABNORMAL LOW (ref >60)
Glucose, Bld: 90 mg/dL (ref 70–99)
Potassium: 4.6 mmol/L (ref 3.5–5.1)
Sodium: 132 mmol/L — ABNORMAL LOW (ref 135–145)

## 2023-02-06 LAB — HEPATIC FUNCTION PANEL
ALT: 15 U/L (ref 0–44)
AST: 41 U/L (ref 15–41)
Albumin: 3.3 g/dL — ABNORMAL LOW (ref 3.5–5.0)
Alkaline Phosphatase: 56 U/L (ref 38–126)
Bilirubin, Direct: 0.5 mg/dL — ABNORMAL HIGH (ref 0.0–0.2)
Indirect Bilirubin: 1 mg/dL — ABNORMAL HIGH (ref 0.3–0.9)
Total Bilirubin: 1.5 mg/dL — ABNORMAL HIGH (ref 0.3–1.2)
Total Protein: 6.5 g/dL (ref 6.5–8.1)

## 2023-02-06 LAB — T4, FREE: Free T4: <0.25 ng/dL — ABNORMAL LOW (ref 0.61–1.12)

## 2023-02-06 LAB — CBC
HCT: 51.8 % (ref 39.0–52.0)
Hemoglobin: 16 g/dL (ref 13.0–17.0)
MCH: 33.2 pg (ref 26.0–34.0)
MCHC: 30.9 g/dL (ref 30.0–36.0)
MCV: 107.5 fL — ABNORMAL HIGH (ref 80.0–100.0)
Platelets: 142 10*3/uL — ABNORMAL LOW (ref 150–400)
RBC: 4.82 MIL/uL (ref 4.22–5.81)
RDW: 15.7 % — ABNORMAL HIGH (ref 11.5–15.5)
WBC: 4.5 10*3/uL (ref 4.0–10.5)
nRBC: 0 % (ref 0.0–0.2)

## 2023-02-06 LAB — HEPARIN LEVEL (UNFRACTIONATED): Heparin Unfractionated: >1.1 IU/mL — ABNORMAL HIGH (ref 0.30–0.70)

## 2023-02-06 LAB — D-DIMER, QUANTITATIVE: D-Dimer, Quant: 0.63 ug/mL-FEU — ABNORMAL HIGH (ref 0.00–0.50)

## 2023-02-06 LAB — TSH: TSH: 83.727 u[IU]/mL — ABNORMAL HIGH (ref 0.350–4.500)

## 2023-02-06 LAB — BRAIN NATRIURETIC PEPTIDE: B Natriuretic Peptide: 174.1 pg/mL — ABNORMAL HIGH (ref 0.0–100.0)

## 2023-02-06 LAB — APTT: aPTT: 35 seconds (ref 24–36)

## 2023-02-06 LAB — TROPONIN I (HIGH SENSITIVITY)
Troponin I (High Sensitivity): 35 ng/L — ABNORMAL HIGH (ref <18)
Troponin I (High Sensitivity): 28 ng/L — ABNORMAL HIGH (ref <18)

## 2023-02-06 LAB — MAGNESIUM: Magnesium: 2.1 mg/dL (ref 1.7–2.4)

## 2023-02-06 LAB — AMMONIA: Ammonia: 21 umol/L (ref 9–35)

## 2023-02-06 MED ORDER — LEVOTHYROXINE SODIUM 100 MCG/5ML IV SOLN
12.5000 ug | Freq: Every day | INTRAVENOUS | Status: DC
  Filled 2023-02-06: qty 5

## 2023-02-06 MED ORDER — ONDANSETRON HCL 4 MG PO TABS: 4.0000 mg | ORAL_TABLET | Freq: Four times a day (QID) | ORAL | Status: DC | PRN

## 2023-02-06 MED ORDER — ONDANSETRON HCL 4 MG/2ML IJ SOLN: 4.0000 mg | Freq: Four times a day (QID) | INTRAMUSCULAR | Status: DC | PRN

## 2023-02-06 MED ORDER — SODIUM CHLORIDE 0.9 % IV BOLUS: 500.0000 mL | Freq: Once | INTRAVENOUS | Status: DC

## 2023-02-06 MED ORDER — HYDRALAZINE HCL 20 MG/ML IJ SOLN: 10.0000 mg | Freq: Three times a day (TID) | INTRAMUSCULAR | Status: DC | PRN

## 2023-02-06 MED ORDER — ACETAMINOPHEN 325 MG PO TABS: 650.0000 mg | ORAL_TABLET | Freq: Four times a day (QID) | ORAL | Status: DC | PRN

## 2023-02-06 MED ORDER — SODIUM CHLORIDE 0.9% FLUSH: 3.0000 mL | INTRAVENOUS | Status: DC | PRN

## 2023-02-06 MED ORDER — FUROSEMIDE 10 MG/ML IJ SOLN
80.0000 mg | Freq: Two times a day (BID) | INTRAMUSCULAR | Status: DC
  Administered 2023-02-06: 80 mg via INTRAVENOUS
  Filled 2023-02-06: qty 8

## 2023-02-06 MED ORDER — ALBUTEROL SULFATE HFA 108 (90 BASE) MCG/ACT IN AERS: 2.0000 | INHALATION_SPRAY | RESPIRATORY_TRACT | Status: DC | PRN

## 2023-02-06 MED ORDER — HEPARIN (PORCINE) 25000 UT/250ML-% IV SOLN
1500.0000 [IU]/h | INTRAVENOUS | Status: DC
  Administered 2023-02-06: 1500 [IU]/h via INTRAVENOUS
  Filled 2023-02-06: qty 250

## 2023-02-06 MED ORDER — BUMETANIDE 0.25 MG/ML IJ SOLN
2.0000 mg | Freq: Once | INTRAMUSCULAR | Status: AC
  Administered 2023-02-06: 2 mg via INTRAVENOUS
  Filled 2023-02-06: qty 8

## 2023-02-06 MED ORDER — ALBUTEROL SULFATE (2.5 MG/3ML) 0.083% IN NEBU
2.5000 mg | INHALATION_SOLUTION | RESPIRATORY_TRACT | Status: DC | PRN
  Administered 2023-02-08: 2.5 mg via RESPIRATORY_TRACT

## 2023-02-06 MED ORDER — IPRATROPIUM-ALBUTEROL 0.5-2.5 (3) MG/3ML IN SOLN
3.0000 mL | Freq: Four times a day (QID) | RESPIRATORY_TRACT | Status: DC
  Administered 2023-02-06 – 2023-02-11 (×17): 3 mL via RESPIRATORY_TRACT
  Filled 2023-02-06 (×16): qty 3

## 2023-02-06 MED ORDER — ACETAMINOPHEN 650 MG RE SUPP: 650.0000 mg | Freq: Four times a day (QID) | RECTAL | Status: DC | PRN

## 2023-02-06 MED ORDER — SODIUM CHLORIDE 0.9% FLUSH
3.0000 mL | Freq: Two times a day (BID) | INTRAVENOUS | Status: DC
  Administered 2023-02-06 – 2023-02-12 (×12): 3 mL via INTRAVENOUS

## 2023-02-06 MED ORDER — SODIUM CHLORIDE 0.9 % IV SOLN: 250.0000 mL | INTRAVENOUS | Status: DC | PRN

## 2023-02-06 NOTE — ED Notes (Signed)
Fulton Mole (friend): [phone]

## 2023-02-06 NOTE — ED Notes (Signed)
Visitors at the bedside.

## 2023-02-06 NOTE — Progress Notes (Signed)
ABG drawn with result showing critical values, PA notified.

## 2023-02-06 NOTE — Progress Notes (Signed)
Night time RT accepted patient from [facility] already on bipap.   02/06/23 2003  BiPAP/CPAP/SIPAP  BiPAP/CPAP/SIPAP Pt Type Adult  BiPAP/CPAP/SIPAP V60  Mask Type Full face mask  Mask Size Medium  Set Rate 20 breaths/min  Respiratory Rate 20 breaths/min  IPAP 20 cmH20  EPAP (S)  8 cmH2O (following ABG)  FiO2 (%) 40 %  Minute Ventilation 4.3  Leak  (none)  Peak Inspiratory Pressure (PIP) 20  Tidal Volume (Vt) 213  Patient Home Equipment No  Auto Titrate No  Press High Alarm 25 cmH2O  Press Low Alarm 5 cmH2O  Oxygen Percent 40 %  BiPAP/CPAP /SiPAP Vitals  Pulse Rate (!) 54  Resp 20  SpO2 94 %  Bilateral Breath Sounds Diminished  MEWS Score/Color  MEWS Score 0  MEWS Score Color Green

## 2023-02-06 NOTE — Progress Notes (Addendum)
ANTICOAGULATION CONSULT NOTE - Initial Consult  Pharmacy Consult for Heparin infusion Indication: atrial fibrillation  Allergies  Allergen Reactions   Diltiazem     rash   Amiodarone Rash    Patient Measurements: Height: 5\' 7"  (170.2 cm) Weight: (!) 148.8 kg (328 lb) (weight from 10/2022) IBW/kg (Calculated) : 66.1 Heparin Dosing Weight: 102.5 kg  Vital Signs: Temp: 98.8 F (37.1 C) (07/05 2017) Temp Source: Axillary (07/05 2017) BP: 114/78 (07/05 1930) Pulse Rate: 54 (07/05 2003)  Labs: Recent Labs    02/06/23 1434 02/06/23 1505 02/06/23 1628 02/06/23 1954  HGB 16.0 18.4* 18.7* 18.7*  HCT 51.8 54.0* 55.0* 55.0*  PLT 142*  --   --   --   CREATININE 1.76*  --   --   --     Estimated Creatinine Clearance: 49.3 mL/min (A) (by C-G formula based on SCr of 1.76 mg/dL (H)).   Medical History: Past Medical History:  Diagnosis Date   Acute diastolic heart failure (HCC) 08/03/2020   Arthritis    Diverticulosis 05/2015   Mild, noted on colonosocpy   Fatty liver 05/14/2015   noted on [place] ABD   History of colon polyps 05/2014   sessile in ascending colon, pedunculated in sigmoid colon, diverticulosis   Hyperlipidemia    pt denies   Hypertension    not currently taking medications per MD   OSA (obstructive sleep apnea) 08/03/2020   Renal cyst 05/2014   Small, Left, noted on [place] ABD   Umbilical hernia    Varicose veins     Medications:  (Not in a hospital admission)   Assessment: 77 yo M presents with AMS, visual hallucinations x 2 weeks. Patient has a history of Afib and takes apixaban 5mg  po BID at home and last dose was taken on 7/4 at 18:00. Pharmacy consulted to dose heparin infusion for Afib.   Hgb 18.7, Plt 142 No s/sx of bleeding  Goal of Therapy:  Heparin level 0.3-0.7 units/ml Monitor platelets by anticoagulation protocol: Yes   Plan:  Start heparin infusion at 1500 units/hr No bolus with recent apixaban administration Check baseline aPTT and  heparin level Check aPTT in 6 hours F/u neuro status and plan to transition back to apixaban   Wilburn Cornelia, PharmD, BCPS Clinical Pharmacist 02/06/2023 8:37 PM   Please refer to [facility]ION for pharmacy phone number

## 2023-02-06 NOTE — ED Notes (Signed)
Pt reports being unable to ambulate short distances without becoming short of breath.

## 2023-02-06 NOTE — ED Triage Notes (Signed)
Pt BIBGEMS from home with AMS, with visual hallucinations and speaking / referring to them. Per wife he has been like this for two weeks.   Wears 2 LPM at baseline  Pt c/o generalized weakness, difficulty speaking and difficulty walking  SB 56 w/ AV block 160/90 CBG 150   20 G LAC

## 2023-02-06 NOTE — ED Provider Notes (Signed)
[place] EMERGENCY DEPARTMENT AT [facility] Provider Note   CSN: [id number] Arrival date & time: 02/06/23  1353     History  Chief Complaint  Patient presents with   Shortness of Breath    Shane Diaz is a 77 y.o. male.  Patient is a 77 year old male with a past medical history of A-fib on Eliquis, obesity hypoventilation syndrome on 2 L nasal cannula, CHF, VTE presenting to the emergency department with weakness and fatigue.  Patient states that since Monday or Tuesday of this week he has been feeling more weak and off balance when he is trying to get up and walk.  He states that when he gets back from walking somewhere he feels very fatigued.  He states that he has had a decreased appetite but denies any nausea, vomiting or diarrhea.  His wife also reports that he has been needing his oxygen around-the-clock recently which she was previously using as needed.  She states that his oxygen saturations will be all over the place and sometimes dropping and sometimes going back to normal.  He denies any associated chest pain reports some mild shortness of breath.  He denies any fever or cough.  He denies significant lower extremity swelling.  His wife states that he does have 2 nonhealing wounds on his legs.  She states that she is doing regular dressing changes.  He denies any dysuria or hematuria.  He is wife and family friend also reports that he has been "hallucinating" where they state that they will be talking about something and then he will suddenly start talking nonsense and also stated that he claimed that he saw a dog under their bed.  The history is provided by the patient, the spouse and a friend.       Home Medications Prior to Admission medications   Medication Sig Start Date End Date Taking? Authorizing Provider  amiodarone (PACERONE) 200 MG tablet TAKE ONE TABLET BY MOUTH DAILY 05/13/22   [name], Rachelle Hora, MD  apixaban (ELIQUIS) 5 MG TABS tablet Take 1 tablet (5  mg total) by mouth 2 (two) times daily. 10/23/22   [name], [name], MD  apixaban (ELIQUIS) 5 MG TABS tablet Take 1 tablet (5 mg total) by mouth 2 (two) times daily. 10/23/22   [name], [name], MD  bumetanide (BUMEX) 2 MG tablet TAKE 2 TABLETS BY MOUTH TWICE A DAY 01/12/23   [name], [name], MD  doxycycline (VIBRAMYCIN) 100 MG capsule Take 100 mg by mouth 2 (two) times daily. 02/28/22   [provider]  eplerenone (INSPRA) 25 MG tablet Take 1 tablet (25 mg total) by mouth daily. 12/26/22   Rennis Chris, MD  Magnesium Oxide 400 MG CAPS Take 1 capsule (400 mg total) by mouth daily. 12/04/21   [name], [name], MD  metoprolol succinate (TOPROL-XL) 50 MG 24 hr tablet TAKE ONE TABLET BY MOUTH DAILY WITH OR IMMEDIATELY FOLLOWING A MEAL 06/17/22   [name], [name], MD  nitroGLYCERIN (NITROSTAT) 0.4 MG SL tablet Place 1 tablet (0.4 mg total) under the tongue every 5 (five) minutes x 3 doses as needed for chest pain. Patient not taking: Reported on 10/23/2022 12/04/21   [name], Rachelle Hora, MD  Potassium Chloride ER 20 MEQ TBCR Take 20 mEq by mouth 2 (two) times daily. 10/23/21   [name], [name], MD      Allergies    Diltiazem and Amiodarone    Review of Systems   Review of Systems  Physical Exam Updated Vital Signs BP 116/68 (BP Location:  Right Arm)   Pulse (!) 58   Temp 98.5 F (36.9 C)   Resp 20   SpO2 98%  Physical Exam Vitals and nursing note reviewed.  Constitutional:      General: He is not in acute distress.    Appearance: Normal appearance. He is obese.  HENT:     Head: Normocephalic and atraumatic.     Nose: Nose normal.     Mouth/Throat:     Mouth: Mucous membranes are moist.     Pharynx: Oropharynx is clear.  Eyes:     Extraocular Movements: Extraocular movements intact.     Conjunctiva/sclera: Conjunctivae normal.     Pupils: Pupils are equal, round, and reactive to light.  Cardiovascular:     Rate and Rhythm: Normal rate and regular rhythm.     Heart sounds: Normal heart  sounds.  Pulmonary:     Effort: Pulmonary effort is normal.     Breath sounds: Normal breath sounds.  Abdominal:     General: Abdomen is flat.     Palpations: Abdomen is soft.     Tenderness: There is no abdominal tenderness.  Musculoskeletal:        General: Normal range of motion.     Cervical back: Normal range of motion.     Right lower leg: Edema (1+) present.     Left lower leg: Edema (1+) present.  Skin:    General: Skin is warm and dry.     Comments: ~1cm superficial ulcer on R anterior shin, no surrounding erythema, warmth or drainage ~1cm superficial ulcer on L medial malleolus, oozing blood, no surrounding erythema, warmth or purulence   Neurological:     General: No focal deficit present.     Mental Status: He is alert and oriented to person, place, and time.     Cranial Nerves: No cranial nerve deficit.     Sensory: No sensory deficit.     Motor: No weakness.     Coordination: Coordination normal.  Psychiatric:        Mood and Affect: Mood normal.        Behavior: Behavior normal.     ED Results / Procedures / Treatments   Labs (all labs ordered are listed, but only abnormal results are displayed) Labs Reviewed  BASIC METABOLIC PANEL - Abnormal; Notable for the following components:      Result Value   Sodium 132 (*)    Chloride 80 (*)    CO2 42 (*)    Creatinine, Ser 1.76 (*)    GFR, Estimated 39 (*)    All other components within normal limits  CBC - Abnormal; Notable for the following components:   MCV 107.5 (*)    RDW 15.7 (*)    Platelets 142 (*)    All other components within normal limits  HEPATIC FUNCTION PANEL - Abnormal; Notable for the following components:   Albumin 3.3 (*)    Total Bilirubin 1.5 (*)    Bilirubin, Direct 0.5 (*)    Indirect Bilirubin 1.0 (*)    All other components within normal limits  BRAIN NATRIURETIC PEPTIDE - Abnormal; Notable for the following components:   B Natriuretic Peptide 174.1 (*)    All other components  within normal limits  TSH - Abnormal; Notable for the following components:   TSH 83.727 (*)    All other components within normal limits  I-STAT VENOUS BLOOD GAS, ED - Abnormal; Notable for the following components:   pCO2, Peter Minium  87.2 (*)    pO2, Ven 50 (*)    Bicarbonate 51.9 (*)    TCO2 >50 (*)    Acid-Base Excess 20.0 (*)    Sodium 129 (*)    Calcium, Ion 1.05 (*)    HCT 54.0 (*)    Hemoglobin 18.4 (*)    All other components within normal limits  I-STAT ARTERIAL BLOOD GAS, ED - Abnormal; Notable for the following components:   pH, Arterial 7.343 (*)    pCO2 arterial 85.7 (*)    Bicarbonate 46.6 (*)    TCO2 49 (*)    Acid-Base Excess 15.0 (*)    Sodium 127 (*)    HCT 55.0 (*)    Hemoglobin 18.7 (*)    All other components within normal limits  AMMONIA  URINALYSIS, ROUTINE W REFLEX MICROSCOPIC  RAPID URINE DRUG SCREEN, HOSP PERFORMED  BLOOD GAS, ARTERIAL  T4, FREE  CBG MONITORING, ED  I-STAT CHEM 8, ED  I-STAT ARTERIAL BLOOD GAS, ED    EKG EKG Interpretation Date/Time:  Friday February 06 2023 15:33:01 EDT Ventricular Rate:  56 PR Interval:    QRS Duration:  123 QT Interval:  500 QTC Calculation: 483 R Axis:   150  Text Interpretation: Junctional rhythm Nonspecific intraventricular conduction delay Anteroseptal infarct, age indeterminate No significant change since last tracing Confirmed by Elayne Snare ([id number]) on 02/06/2023 3:37:30 PM  Radiology CT Head Wo Contrast  Result Date: 02/06/2023 CLINICAL DATA:  Mental status change, unknown cause EXAM: CT HEAD WITHOUT CONTRAST TECHNIQUE: Contiguous axial images were obtained from the base of the skull through the vertex without intravenous contrast. RADIATION DOSE REDUCTION: This exam was performed according to the departmental dose-optimization program which includes automated exposure control, adjustment of the mA and/or kV according to patient size and/or use of iterative reconstruction technique. COMPARISON:  Head CT  09/24/2020 FINDINGS: Brain: No intracranial hemorrhage, mass effect, or midline shift. Age related atrophy. No hydrocephalus. The basilar cisterns are patent. Moderate periventricular and deep white matter hypodensity typical of chronic small vessel ischemic change. No evidence of territorial infarct or acute ischemia. No extra-axial or intracranial fluid collection. Vascular: Atherosclerosis of skullbase vasculature without hyperdense vessel or abnormal calcification. Skull: No fracture or focal lesion. Sinuses/Orbits: Bilateral cataract resection. Mucosal thickening throughout the left maxillary sinus is new from prior exam. Minimal opacification of lower left mastoid air cells. Other: None. IMPRESSION: 1. No acute intracranial abnormality. 2. 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 3. Left maxillary sinus mucosal thickening, new from prior exam. Recommend correlation for symptoms of sinusitis. Electronically Signed   By: Narda Rutherford M.D.   On: 02/06/2023 16:19   DG Chest 2 View  Result Date: 02/06/2023 CLINICAL DATA:  Shortness of breath EXAM: CHEST - 2 VIEW COMPARISON:  X-ray and CT angiogram 03/18/2021 FINDINGS: Under penetrated radiographs. Motion on lateral view as well. Enlarged cardiopericardial silhouette with vascular congestion and small effusions. No pneumothorax. No obvious consolidation but limited x-rays. Overlapping cardiac leads. IMPRESSION: Limited x-rays. Enlarged heart with vascular congestion and small effusions. Recommend follow-up Electronically Signed   By: Karen Kays M.D.   On: 02/06/2023 15:11    Procedures .Critical Care  Performed by: Rexford Maus, DO Authorized by: Rexford Maus, DO   Critical care provider statement:    Critical care time (minutes):  40   Critical care time was exclusive of:  Separately billable procedures and treating other patients   Critical care was necessary to treat or  prevent imminent or life-threatening  deterioration of the following conditions:  Respiratory failure   Critical care was time spent personally by me on the following activities:  Development of treatment plan with patient or surrogate, discussions with primary provider, evaluation of patient's response to treatment, examination of patient, obtaining history from patient or surrogate, ordering and performing treatments and interventions, ordering and review of laboratory studies, ordering and review of radiographic studies, pulse oximetry, re-evaluation of patient's condition and review of old charts   I assumed direction of critical care for this patient from another provider in my specialty: no     Care discussed with: admitting provider       Medications Ordered in ED Medications  albuterol (VENTOLIN HFA) 108 (90 Base) MCG/ACT inhaler 2 puff (has no administration in time range)  bumetanide (BUMEX) injection 2 mg (2 mg Intravenous Given 02/06/23 1754)    ED Course/ Medical Decision Making/ A&P Clinical Course as of 02/06/23 1919  Fri Feb 06, 2023  1721 Patient desatting into the 80's despite being on 5L [place]. Does appear to be a chronic CO2 retainer on ABG but will be placed on BiPAP for hypoxia and mild hypercarbia from likely baseline. Will be given IV bumex for some mild pulmonary edema on CXR. [VK]  1833 Mildly elevated BNP, ammonia normal. [VK]  1919 Elevated TSH, free T4 ordered. Patient signed out to hospitalist for acute hypoxic respiratory failure. [VK]    Clinical Course User Index [VK] Rexford Maus, DO                             Medical Decision Making This patient presents to the ED with chief complaint(s) of weakness, hallucinations with pertinent past medical history of A-fib and VTE on Eliquis, CHF, obesity hypoventilation syndrome on 2 L nasal cannula which further complicates the presenting complaint. The complaint involves an extensive differential diagnosis and also carries with it a high risk of  complications and morbidity.    The differential diagnosis includes hypercarbia, electrolyte abnormality, dehydration, intoxication, infection, considering ICH, mass effect, CVA, TIA  Additional history obtained: Additional history obtained from family Records reviewed outpatient cardiology records  ED Course and Reassessment: On patient's arrival he is hemodynamically stable in no acute distress without focal neurologic deficits.  He was initially evaluated by provider in triage and had EKG, labs, chest x-ray and CT head ordered.  Patient's labs thus far have showed what appears to be a chronic CO2 retention with a normal pH and elevated bicarb making acute hypercarbia unlikely cause of his symptoms.  Mild hyponatremia and hypochloremia, creatinine essentially at patient's baseline.  Remainder of patient's labs and head CT are pending at this time he will be closely reassessed.  Independent labs interpretation:  The following labs were independently interpreted: elevated TSH, mildly elevated BNP, chronic CO2 retaining  Independent visualization of imaging: - I independently visualized the following imaging with scope of interpretation limited to determining acute life threatening conditions related to emergency care: CXR, which revealed mild pulmonary edema  Consultation: - Consulted or discussed management/test interpretation w/ external professional: hospitalist  Consideration for admission or further workup: patient requires admission for his acute hypoxic respiratory failure Social Determinants of health: N/A    Amount and/or Complexity of Data Reviewed Labs: ordered. Radiology: ordered.  Risk Prescription drug management. Decision regarding hospitalization.          Final Clinical Impression(s) / ED Diagnoses Final diagnoses:  Acute on chronic respiratory failure with hypoxia [facility])    Rx / DC Orders ED Discharge Orders     None         Rexford Maus, DO 02/06/23 1919

## 2023-02-06 NOTE — ED Notes (Signed)
RT called to set up bipap as ordered by MD.

## 2023-02-06 NOTE — Progress Notes (Signed)
Pt transported from ED to 3E16 on BiPAP without complications. Report given to 3E RT.

## 2023-02-06 NOTE — H&P (Addendum)
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, obesity hypoventilation syndrome on 2 L nasal cannula, chronic hypoxic respiratory failure secondary from OSA and obesity on 2 L oxygen at baseline, obstructive sleep apnea complicated by chronic cor pulmonale and right-sided failure, severe venous insufficiency of bilateral lower extremities (superficial venous insufficiency of the lower extremities with ultrasound demonstrated reflux, history of distal femoral-popliteal DVT of the right lower extremity in 2015, 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  During my evaluation at the emergency department currently patient is on BiPAP.  He was able to open his eye with voice command.  Complaining about shortness of breath even on BiPAP, chest pain and abdominal pain.  Chart review of the emergency medicine physician note, patient progressively developing fatigued with walking over the course of 1 week.  He also has low appetite and denies any nausea, vomiting and diarrhea.  Patient's wife reported that he has been requiring more oxygen around-the-clock recently which he used to use previously as needed.  His oxygen saturation all over the place at home and sometimes comes back to normal.  He denies any fever,  cough, palpitation, headache and blurry vision.  Even the patient denies lower extremity swelling physical exam showed bilateral lower extremity pitting edema 1+.  Patient also has bilateral lower extremity chronic nonhealing ulcer/wound from chronic lymphedema and venous insufficiency.  Patient's wife does do regular dressing change.  Patient's wife also reporting that he has been hallucinating and sometimes talking without making any sense.   ED Course:  Initial presentation to ED heart rate 63, respiratory rate 20, blood pressure 112/68 and O2 sat 97% on 4 L nasal cannula oxygen.  However after a while patient desatted to 86% and Berkley bumped up to 5 L without any improvement. ABG showed pH 7.3/pCO2 elevated 87, pO2 low 50 and bicarb 51.    With concern for hypoxia and hypercapnia patient placed on BiPAP maintaining O2 sat in between 93 to 94%.  Chest x-ray showed enlarged heart with vascular congestion and small effusion.  As patient presented with altered mentation CT head obtained which ruled out any acute intracranial abnormality, age-related atrophy and chronic vessel ischemic change and left maxillary sinus mucosal thickening concern for symptoms sinusitis.  Lab work, been showed sodium 132, bicarb 40, creatinine 1.6 and GFR 39. CBC WBC 4.7, hemoglobin 16, and platelet 142. Hepatic function panel/low albumin 3.3, slight elevated bilirubin 1.5. BNP 174. Tsh found significant elevated 83.   Review of Systems:  Review of Systems  Constitutional:  Negative for malaise/fatigue.  Respiratory:  Negative for cough.   Cardiovascular:  Positive for orthopnea and PND. Negative for chest pain and palpitations.  Gastrointestinal:  Negative for abdominal pain.  Musculoskeletal:  Negative for myalgias and neck pain.  Neurological:  Negative for headaches.  Psychiatric/Behavioral:  The patient is not nervous/anxious.     Past Medical History:  Diagnosis Date   Acute diastolic heart failure (HCC)  21/30/8657   Arthritis    Diverticulosis 05/2015   Mild, noted on colonosocpy   Fatty liver 05/14/2015   noted on [place] ABD   History of colon polyps 05/2014   sessile in ascending colon, pedunculated in sigmoid colon, diverticulosis   Hyperlipidemia    pt denies   Hypertension    not currently taking medications per MD   OSA (obstructive sleep apnea) 08/03/2020   Renal cyst 05/2014   Small, Left, noted on [place] ABD   Umbilical hernia    Varicose veins     Past Surgical History:  Procedure Laterality Date   APPENDECTOMY     childhood   BIOPSY  07/11/2020   Procedure: BIOPSY;  Surgeon: Meryl Dare, MD;  Location: [facility] ENDOSCOPY;  Service: Endoscopy;;   CARDIOVERSION N/A 07/13/2020   Procedure: CARDIOVERSION;  Surgeon: Wendall Stade, MD;  Location: [facility] ENDOSCOPY;  Service: Cardiovascular;  Laterality: N/A;   CARDIOVERSION N/A 08/02/2020   Procedure: CARDIOVERSION;  Surgeon: Christell Constant, MD;  Location: [facility] ENDOSCOPY;  Service: Cardiovascular;  Laterality: N/A;   CARDIOVERSION N/A 12/12/2021   Procedure: CARDIOVERSION;  Surgeon: Pricilla Riffle, MD;  Location: [facility] ENDOSCOPY;  Service: Cardiovascular;  Laterality: N/A;   COLONOSCOPY W/ POLYPECTOMY  05/23/2014   COLONOSCOPY WITH PROPOFOL N/A 07/11/2020   Procedure: COLONOSCOPY WITH PROPOFOL;  Surgeon: Meryl Dare, MD;  Location: [facility] ENDOSCOPY;  Service: Endoscopy;  Laterality: N/A;   ESOPHAGOGASTRODUODENOSCOPY (EGD) WITH PROPOFOL N/A 07/11/2020   Procedure: ESOPHAGOGASTRODUODENOSCOPY (EGD) WITH PROPOFOL;  Surgeon: Meryl Dare, MD;  Location: [facility] ENDOSCOPY;  Service: Endoscopy;  Laterality: N/A;   TEE WITHOUT CARDIOVERSION N/A 07/13/2020   Procedure: TRANSESOPHAGEAL ECHOCARDIOGRAM (TEE);  Surgeon: Wendall Stade, MD;  Location: [facility] ENDOSCOPY;  Service: Cardiovascular;  Laterality: N/A;   UMBILICAL HERNIA REPAIR N/A 04/27/2018   Procedure: REPAIR UMBILICAL HERNIA ERAS PATHWAY;  Surgeon: Ovidio Kin, MD;  Location: [facility] ORS;   Service: General;  Laterality: N/A;   varicose veins     WRIST SURGERY       reports that he has never smoked. He has never used smokeless tobacco. He reports current alcohol use. He reports that he does not use drugs.  Allergies  Allergen Reactions   Diltiazem     rash   Amiodarone Rash    Family History  Problem Relation Age of Onset   Colon cancer Neg Hx    Esophageal cancer Neg Hx    Pancreatic cancer Neg Hx    Prostate cancer Neg Hx    Rectal cancer Neg Hx    Stomach cancer Neg Hx     Prior to Admission medications   Medication Sig Start Date End Date Taking? Authorizing Provider  amiodarone (PACERONE) 200 MG tablet TAKE ONE TABLET BY MOUTH DAILY 05/13/22   [name], Rachelle Hora, MD  apixaban (ELIQUIS) 5 MG TABS tablet Take 1 tablet (5 mg total) by mouth 2 (two) times daily. 10/23/22   [name], [name], MD  apixaban (ELIQUIS) 5 MG TABS tablet Take 1 tablet (5 mg total) by mouth 2 (two) times daily. 10/23/22   [name], [name], MD  bumetanide (BUMEX) 2 MG tablet TAKE 2 TABLETS BY MOUTH TWICE A DAY 01/12/23   [name], [name], MD  doxycycline (VIBRAMYCIN) 100 MG capsule Take 100 mg by mouth 2 (two) times daily. 02/28/22   [provider]  eplerenone (INSPRA) 25 MG tablet Take 1 tablet (25 mg total) by mouth daily. 12/26/22   Rennis Chris, MD  Magnesium Oxide 400 MG CAPS Take 1 capsule (400 mg total) by mouth daily. 12/04/21   [name], [name], MD  metoprolol succinate (TOPROL-XL) 50 MG 24 hr tablet TAKE ONE TABLET BY MOUTH DAILY WITH OR IMMEDIATELY FOLLOWING A MEAL 06/17/22   [name], [name], MD  nitroGLYCERIN (NITROSTAT) 0.4 MG SL tablet Place 1 tablet (0.4 mg total) under the tongue every 5 (five) minutes x 3 doses as needed for chest pain. Patient not taking: Reported on 10/23/2022 12/04/21   [name], Rachelle Hora, MD  Potassium Chloride ER 20 MEQ TBCR Take 20 mEq by mouth 2 (two) times daily. 10/23/21   [name], Rachelle Hora, MD     Physical Exam: Vitals:   02/06/23 2003 02/06/23 2017  02/06/23 2032 02/06/23 2111  BP:    114/73  Pulse: (!) 54  (!) 56 (!) 56  Resp: 20   15  Temp:  98.8 F (37.1 C)  98.5 F (36.9 C)  TempSrc:  Axillary  Axillary  SpO2: 94%   97%  Weight:   (!) 148.8 kg (!) 137.5 kg  Height:   5\' 7"  (1.702 m) 5\' 8"  (1.727 m)    Physical Exam Constitutional:      Appearance: He is obese.  HENT:     Head: Normocephalic.  Cardiovascular:     Rate and Rhythm: Bradycardia present. Rhythm irregular.  Pulmonary:     Effort: No tachypnea or respiratory distress.     Breath sounds: No decreased breath sounds, wheezing, rhonchi or rales.     Comments: Currently on BiPAP Chest:     Chest wall: No tenderness.  Musculoskeletal:     Cervical back: Neck supple.     Right lower leg: Edema present.     Left lower leg: Edema present.     Comments: Bilateral lower extremity chronic wound. Bilateral lower extremities pitting edema 1+.  Skin:    General: Skin is warm.     Capillary Refill: Capillary refill takes less than 2 seconds.  Neurological:     Mental Status: He is alert.     Comments: Alert and oriented to self only      Labs on Admission: I have personally reviewed following labs and imaging studies  CBC: Recent Labs  Lab 02/06/23 1434 02/06/23 1505 02/06/23 1628 02/06/23 1954  WBC 4.5  --   --   --   HGB 16.0 18.4* 18.7* 18.7*  HCT 51.8 54.0* 55.0* 55.0*  MCV 107.5*  --   --   --   PLT 142*  --   --   --    Basic Metabolic Panel: Recent Labs  Lab 02/06/23 1434 02/06/23 1505 02/06/23 1628 02/06/23 1954 02/06/23 2045  NA 132* 129* 127* 130*  --   K 4.6 4.7 4.2 4.2  --   CL 80*  --   --   --   --   CO2 42*  --   --   --   --   GLUCOSE 90  --   --   --   --   BUN 12  --   --   --   --   CREATININE 1.76*  --   --   --   --   CALCIUM 8.9  --   --   --   --   MG  --   --   --   --  2.1   GFR: Estimated Creatinine Clearance: 47.7 mL/min (A) (by C-G formula based on SCr of 1.76 mg/dL (H)). Liver Function Tests: Recent Labs   Lab 02/06/23 1434  AST 41  ALT 15  ALKPHOS 56  BILITOT 1.5*  PROT 6.5  ALBUMIN 3.3*   No results for input(s): "LIPASE", "AMYLASE" in the last 168 hours. Recent Labs  Lab 02/06/23 1705  AMMONIA 21   Coagulation Profile: No results for input(s): "INR", "PROTIME" in the last 168 hours. Cardiac Enzymes: Recent Labs  Lab 02/06/23 2045  TROPONINIHS 35*   BNP (last 3 results) Recent Labs    02/06/23 1705  BNP 174.1*   HbA1C: No results for input(s): "HGBA1C" in the last 72 hours. CBG: Recent Labs  Lab 02/06/23 1505  GLUCAP 86   Lipid Profile: No results for input(s): "CHOL", "HDL", "LDLCALC", "TRIG", "CHOLHDL", "LDLDIRECT" in the last 72 hours. Thyroid Function Tests: Recent Labs    02/06/23 1705 02/06/23 2045  TSH 83.727*  --   FREET4  --  <0.25*   Anemia Panel: No results for input(s): "VITAMINB12", "FOLATE", "FERRITIN", "TIBC", "IRON", "RETICCTPCT" in the last 72 hours. Urine analysis:    Component Value Date/Time   COLORURINE YELLOW 09/24/2020 1354   APPEARANCEUR CLEAR 09/24/2020 1354   LABSPEC 1.014 09/24/2020 1354   PHURINE 5.0 09/24/2020 1354   GLUCOSEU NEGATIVE 09/24/2020 1354   HGBUR SMALL (A) 09/24/2020 1354   BILIRUBINUR NEGATIVE 09/24/2020 1354   KETONESUR NEGATIVE 09/24/2020 1354   PROTEINUR NEGATIVE 09/24/2020 1354   NITRITE NEGATIVE 09/24/2020 1354   LEUKOCYTESUR NEGATIVE 09/24/2020 1354    Radiological Exams on Admission: I have personally reviewed images CT Head Wo Contrast  Result Date: 02/06/2023 CLINICAL DATA:  Mental status change, unknown cause EXAM: CT HEAD WITHOUT CONTRAST TECHNIQUE: Contiguous axial images were obtained from the base of the skull through the vertex without intravenous contrast. RADIATION DOSE REDUCTION: This exam was performed according to the departmental dose-optimization program which includes automated exposure control, adjustment of the mA and/or kV according to patient size and/or use of iterative  reconstruction technique. COMPARISON:  Head CT 09/24/2020 FINDINGS: Brain: No intracranial hemorrhage, mass effect, or midline shift. Age related atrophy. No hydrocephalus. The basilar cisterns are patent. Moderate periventricular and deep white matter hypodensity typical of chronic small vessel ischemic change. No evidence of territorial infarct or acute ischemia. No extra-axial or intracranial fluid collection. Vascular: Atherosclerosis of skullbase vasculature without hyperdense vessel or abnormal calcification. Skull: No fracture or focal lesion. Sinuses/Orbits: Bilateral cataract resection. Mucosal thickening throughout the left maxillary sinus is new from prior exam. Minimal opacification of lower left mastoid air cells. Other: None. IMPRESSION: 1. No acute intracranial abnormality. 2. 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 3. Left maxillary sinus mucosal thickening, new from prior exam. Recommend correlation for symptoms of sinusitis. Electronically Signed   By: Narda Rutherford M.D.   On: 02/06/2023 16:19   DG Chest 2 View  Result Date: 02/06/2023 CLINICAL DATA:  Shortness of breath EXAM: CHEST - 2 VIEW COMPARISON:  X-ray and CT angiogram 03/18/2021 FINDINGS: Under penetrated radiographs. Motion on lateral view as well. Enlarged cardiopericardial silhouette with vascular congestion and small effusions. No pneumothorax. No obvious consolidation but limited x-rays. Overlapping cardiac leads. IMPRESSION: Limited x-rays. Enlarged heart with vascular congestion and small effusions. Recommend follow-up Electronically Signed   By: Karen Kays M.D.   On: 02/06/2023 15:11    EKG: My personal interpretation of EKG shows: Junctional rhythm Nonspecific intraventricular conduction  delay    Assessment/Plan: Principal Problem:   Acute hypoxic on chronic hypercapnic respiratory failure (HCC) Active Problems:   Atrial fibrillation, chronic (HCC)   Obesity, Class III, BMI 40-49.9 (morbid  obesity) (HCC)   Atrial flutter (HCC)   Chronic wound bilateral lower extremity 2nd from chronic venous insufficiency   Acute hypoxic respiratory failure (HCC)   Acute on chronic respiratory failure with hypoxia and hypercapnia (HCC)   Obesity hypoventilation syndrome (HCC)   Right-sided heart failure (HCC)   Cor pulmonale (chronic) (HCC)   Diastolic heart failure preserved EF (congestive heart failure)   AV heart block   Chronic venous insufficiency   History of DVT 2015 (deep vein thrombosis)   Elevated TSH   Acute exacerbation of CHF (congestive heart failure) (HCC)   Acute metabolic encephalopathy    Assessment and Plan: Acute CHF exacerbation History of right-sided heart failure with preserved EF History of cor pulmonale - Per chart review patient has been seen by cardiology Dr. Rachelle Hora on 10/23/2022. For atrial flutter recommended continue amiodarone.  Given he has history of DVT in the past and currently A-fib and atrial flutter recommended lifelong anticoagulation.  Patient has CHF with right-sided heart failure due to chronic cor pulmonale due to OSA and obesity hypoventilation syndrome.  Recommended home sleep study. -Patient presented complaining of shortness of breath, generalized weakness and increased requirement of oxygen. - Elevated BNP 174 which might not be accurate in the setting of morbid obesity. - Chest x-ray show evidence of vascular congestion and small pleural effusion - At home patient is bumetanide 2 mg twice daily - In the ED patient received Moderna 2 mg IV once. - Blood pressure borderline low.  Starting Lasix 80 mg twice daily. -Patient is retaining pCO2 and hypoxic as well.  Continue BiPAP overnight. - Obtaining echocardiogram - Consulted with heart failure team.  Please reach out in the morning -Continue to monitoring overnight. Addendum: -Patient's blood pressure is borderline low MAP around 67-70.  Holding Bumex/Lasix for now.  Need to assess volume  status and blood pressure in the morning for adjustment of Lasix.   History of atrial fibrillation on Eliquis History of atrial flutter with 2:1 AV block -On presentation EKG showed bradycardia, junctional rhythm and nonspecific intraventricular conduction delay. -Patient is on amiodarone 200 mg daily. - As patient is currently BiPAP. holding amiodarone.  Amiodarone also have long half-life. -Once patient is out of acuity and able to take oral medication from tomorrow resume the amiodarone 200 mg daily. -Bedside telemetry did not show any evidence of arrhythmia as of now. -Repeat EKG in the morning -As patient is on BiPAP and acute metabolic encephalopathy as well cannot give oral Eliquis 5 mg twice daily which is patient's home dose.  Interim I have started heparin drip with pharmacy protocol.  Acute hypoxic and hypercarbic respiratory failure Chronic hypoxic and hypercarbic respiratory failure Chronic hypoxic respiratory failure on 2 L oxygen Obesity hypoventilation syndrome on 2 L oxygen at baseline History of OSA on oxygen -Patient has obesity hypoventilation syndrome and OSA on 2 to 3 L oxygen at the baseline - ABG showed 7.3/87/50/51.  Initially patient is on 4 L nasal cannula oxygen however as ABG showed  evidence of hypercapnia and hypoxia BiPAP was placed on the ED. -Plan to continue BiPAP overnight -Chest x-ray did not show any evidence of pneumonia.  Patient does not have any history of COPD and asthma. Not exactly sure heart cause acute exacerbation of chronic hypoxic respiratory failure.  Checked a D-dimer which is 0.63>.  Age adjusted D-dimer 0.77 which is unlikely PE. - Continuing scheduled DuoNeb 4 times daily and as needed albuterol. -Continue telemonitoring overnight  Hypothyroidism -Elevated TSH 83 and low free T4.25 Patient not have any history of hypothyroidism in the past. - Unable to find any medication on the chart however on the chart review it showed TSH was  7.87 months ago. -Starting levothyroxine 12.5 mcg IV daily can transition to oral once patient able to take oral medication.  Acute metabolic encephalopathy - Acute metabolic phlebitis secondary from acute hypoxic and hypercapnic respiratory failure. -Ammonia within normal range. -CT head ruled out any acute intracranial abnormality. -Continue for fall, aspiration precaution.  Elevated troponin - First troponin 35.  On presentation EKG showed bradycardia, junctional rhythm and nonspecific intraventricular conduction delay. -Elevated troponin secondary from demand ischemia in the setting of congestive heart failure and acute hypoxic and hypercapnic respiratory. - Continue to trend troponin. -No concern for ACS at this time - Obtaining echocardiogram. -Continue telemonitoring overnight for any development of arrhythmia  Chronic venous insufficiency Chronic bilateral lower extremity wound with venous insufficiency -Patient has chronic bilateral lower extremity wound from chronic venous insufficiency - Consulted with inpatient wound care for management.  CKD stage 3b -Creatinine 1.76 on presentation. - Per chart review patient's baseline creatinine between 1.4-1.6 and GFR in between 36 to 47 -Seems like that patient has underlying chronic kidney disease -Continue to monitor renal function - Continue to monitor urine output - Avoid nephrotoxic agents when possible. -On discharge patient need to follow-up with nephrology outpatient   DVT prophylaxis: IV heparin gtts Code Status: Full Code.  Discussed CODE STATUS with patient's daughter Chilton Greathouse over the phone and she has wished to resuscitate patient aggressively in any adverse event. Diet: Currently n.p.o. at on BiPAP Family Communication: Both patient's daughter and patient's wife over the phone Disposition Plan: Plan to discharge to home in 2 to 3 days Consults: Cardiology, heart failure team and wound care Admission status:  Inpatient, Step Down Unit  Severity of Illness: The appropriate patient status for this patient is INPATIENT. Inpatient status is judged to be reasonable and necessary in order to provide the required intensity of service to ensure the patient's safety. The patient's presenting symptoms, physical exam findings, and initial radiographic and laboratory data in the context of their chronic comorbidities is felt to place them at high risk for further clinical deterioration. Furthermore, it is not anticipated that the patient will be medically stable for discharge from the hospital within 2 midnights of admission.   * I certify that at the point of admission it is my clinical judgment that the patient will require inpatient hospital care spanning beyond 2 midnights from the point of admission due to high intensity of service, high risk for further deterioration and high frequency of surveillance required.Marland Kitchen    Tereasa Coop MD [facility]  How to contact the [facility] Attending or Consulting provider 7A - 7P or covering provider during after hours 7P -7A, for this patient?   Check the care team in [facility] and look for a) attending/consulting [facility] provider listed and b) the [facility] team listed Log into [email or web address] and use [place]'s universal password to access. If you do not have the password, please contact the hospital operator. Locate the [facility] provider you are looking for under [facility] and page to a number that you can be directly reached. If you still have difficulty reaching the provider, please page  the [facility] (Director on Call) for the Hospitalists listed on amion for assistance.  02/06/2023, 10:30 PM

## 2023-02-07 DIAGNOSIS — I480 Paroxysmal atrial fibrillation: Secondary | ICD-10-CM | POA: Diagnosis not present

## 2023-02-07 DIAGNOSIS — E039 Hypothyroidism, unspecified: Secondary | ICD-10-CM | POA: Diagnosis not present

## 2023-02-07 DIAGNOSIS — J9612 Chronic respiratory failure with hypercapnia: Secondary | ICD-10-CM | POA: Diagnosis not present

## 2023-02-07 DIAGNOSIS — I5033 Acute on chronic diastolic (congestive) heart failure: Secondary | ICD-10-CM | POA: Diagnosis not present

## 2023-02-07 DIAGNOSIS — I50810 Right heart failure, unspecified: Secondary | ICD-10-CM

## 2023-02-07 DIAGNOSIS — J9601 Acute respiratory failure with hypoxia: Secondary | ICD-10-CM | POA: Diagnosis not present

## 2023-02-07 DIAGNOSIS — N1832 Chronic kidney disease, stage 3b: Secondary | ICD-10-CM | POA: Insufficient documentation

## 2023-02-07 LAB — BLOOD GAS, ARTERIAL
Acid-Base Excess: 20.6 mmol/L — ABNORMAL HIGH (ref 0.0–2.0)
Acid-Base Excess: 20.7 mmol/L — ABNORMAL HIGH (ref 0.0–2.0)
Bicarbonate: 48.8 mmol/L — ABNORMAL HIGH (ref 20.0–28.0)
Bicarbonate: 50.1 mmol/L — ABNORMAL HIGH (ref 20.0–28.0)
Drawn by: 164
O2 Saturation: 96.3 %
O2 Saturation: 97 %
Patient temperature: 37
Patient temperature: 37
pCO2 arterial: 64 mmHg — ABNORMAL HIGH (ref 32–48)
pCO2 arterial: 79 mmHg (ref 32–48)
pH, Arterial: 7.41 (ref 7.35–7.45)
pH, Arterial: 7.49 — ABNORMAL HIGH (ref 7.35–7.45)
pO2, Arterial: 71 mmHg — ABNORMAL LOW (ref 83–108)
pO2, Arterial: 77 mmHg — ABNORMAL LOW (ref 83–108)

## 2023-02-07 LAB — COMPREHENSIVE METABOLIC PANEL
ALT: 17 U/L (ref 0–44)
AST: 53 U/L — ABNORMAL HIGH (ref 15–41)
Albumin: 3.6 g/dL (ref 3.5–5.0)
Alkaline Phosphatase: 55 U/L (ref 38–126)
Anion gap: 16 — ABNORMAL HIGH (ref 5–15)
BUN: 13 mg/dL (ref 8–23)
CO2: 37 mmol/L — ABNORMAL HIGH (ref 22–32)
Calcium: 9.2 mg/dL (ref 8.9–10.3)
Chloride: 80 mmol/L — ABNORMAL LOW (ref 98–111)
Creatinine, Ser: 1.48 mg/dL — ABNORMAL HIGH (ref 0.61–1.24)
GFR, Estimated: 48 mL/min — ABNORMAL LOW (ref >60)
Glucose, Bld: 65 mg/dL — ABNORMAL LOW (ref 70–99)
Potassium: 5.5 mmol/L — ABNORMAL HIGH (ref 3.5–5.1)
Sodium: 133 mmol/L — ABNORMAL LOW (ref 135–145)
Total Bilirubin: 2 mg/dL — ABNORMAL HIGH (ref 0.3–1.2)
Total Protein: 6.6 g/dL (ref 6.5–8.1)

## 2023-02-07 LAB — CBC WITH DIFFERENTIAL/PLATELET
Abs Immature Granulocytes: 0.02 10*3/uL (ref 0.00–0.07)
Basophils Absolute: 0.1 10*3/uL (ref 0.0–0.1)
Basophils Relative: 1 %
Eosinophils Absolute: 0.1 10*3/uL (ref 0.0–0.5)
Eosinophils Relative: 2 %
HCT: 52.2 % — ABNORMAL HIGH (ref 39.0–52.0)
Hemoglobin: 16.8 g/dL (ref 13.0–17.0)
Immature Granulocytes: 0 %
Lymphocytes Relative: 27 %
Lymphs Abs: 1.4 10*3/uL (ref 0.7–4.0)
MCH: 34.2 pg — ABNORMAL HIGH (ref 26.0–34.0)
MCHC: 32.2 g/dL (ref 30.0–36.0)
MCV: 106.3 fL — ABNORMAL HIGH (ref 80.0–100.0)
Monocytes Absolute: 0.6 10*3/uL (ref 0.1–1.0)
Monocytes Relative: 12 %
Neutro Abs: 3.1 10*3/uL (ref 1.7–7.7)
Neutrophils Relative %: 58 %
Platelets: 149 10*3/uL — ABNORMAL LOW (ref 150–400)
RBC: 4.91 MIL/uL (ref 4.22–5.81)
RDW: 15.9 % — ABNORMAL HIGH (ref 11.5–15.5)
WBC: 5.3 10*3/uL (ref 4.0–10.5)
nRBC: 0 % (ref 0.0–0.2)

## 2023-02-07 LAB — HEPARIN LEVEL (UNFRACTIONATED)
Heparin Unfractionated: >1.1 IU/mL — ABNORMAL HIGH (ref 0.30–0.70)
Heparin Unfractionated: >1.1 IU/mL — ABNORMAL HIGH (ref 0.30–0.70)

## 2023-02-07 LAB — APTT
aPTT: >200 seconds (ref 24–36)
aPTT: 172 seconds (ref 24–36)
aPTT: 93 seconds — ABNORMAL HIGH (ref 24–36)

## 2023-02-07 MED ORDER — HEPARIN (PORCINE) 25000 UT/250ML-% IV SOLN
1100.0000 [IU]/h | INTRAVENOUS | Status: DC
  Administered 2023-02-07: 1300 [IU]/h via INTRAVENOUS
  Filled 2023-02-07: qty 250

## 2023-02-07 MED ORDER — MIDODRINE HCL 5 MG PO TABS
10.0000 mg | ORAL_TABLET | Freq: Two times a day (BID) | ORAL | Status: DC
  Administered 2023-02-07 – 2023-02-10 (×6): 10 mg via ORAL
  Filled 2023-02-07 (×6): qty 2

## 2023-02-07 MED ORDER — HEPARIN (PORCINE) 25000 UT/250ML-% IV SOLN: 1300.0000 [IU]/h | INTRAVENOUS | Status: DC

## 2023-02-07 MED ORDER — LEVOTHYROXINE SODIUM 100 MCG/5ML IV SOLN
50.0000 ug | Freq: Every day | INTRAVENOUS | Status: DC
  Administered 2023-02-08 – 2023-02-09 (×2): 50 ug via INTRAVENOUS
  Filled 2023-02-07 (×2): qty 5

## 2023-02-07 MED ORDER — APIXABAN 5 MG PO TABS: 5.0000 mg | ORAL_TABLET | Freq: Two times a day (BID) | ORAL | Status: DC

## 2023-02-07 MED ORDER — FUROSEMIDE 10 MG/ML IJ SOLN
80.0000 mg | Freq: Three times a day (TID) | INTRAMUSCULAR | Status: DC
  Administered 2023-02-07 – 2023-02-08 (×2): 80 mg via INTRAVENOUS
  Filled 2023-02-07 (×3): qty 8

## 2023-02-07 MED ORDER — APIXABAN 5 MG PO TABS
5.0000 mg | ORAL_TABLET | Freq: Two times a day (BID) | ORAL | Status: DC
  Filled 2023-02-07: qty 1

## 2023-02-07 MED ORDER — AMIODARONE HCL 200 MG PO TABS
200.0000 mg | ORAL_TABLET | Freq: Every day | ORAL | Status: DC
  Filled 2023-02-07: qty 1

## 2023-02-07 MED ORDER — FUROSEMIDE 10 MG/ML IJ SOLN
40.0000 mg | Freq: Two times a day (BID) | INTRAMUSCULAR | Status: DC
  Administered 2023-02-07: 40 mg via INTRAVENOUS
  Filled 2023-02-07: qty 4

## 2023-02-07 MED ORDER — FUROSEMIDE 10 MG/ML IJ SOLN
40.0000 mg | Freq: Once | INTRAMUSCULAR | Status: AC
  Administered 2023-02-07: 40 mg via INTRAVENOUS
  Filled 2023-02-07: qty 4

## 2023-02-07 NOTE — Significant Event (Signed)
Pt desated to 82%, per charge nurse Darl Pikes. 2 persons Repositioned and increased 02 to 50%-02 back up to 97%. After settling decreased back down to 40% 02 sats 93%. Desated to 88% now back up to 50%. Pt has not urinated since I gave lasix.  RT notified to help manage.

## 2023-02-07 NOTE — Progress Notes (Signed)
   02/07/23 1323  BiPAP/CPAP/SIPAP  BiPAP/CPAP/SIPAP Pt Type (S)  Adult (Standby: placed pt on 7 L HFNC. Sp02 95%.)

## 2023-02-07 NOTE — Progress Notes (Addendum)
PROGRESS NOTE    Shane Diaz  ZOX:[id number] DOB: 01-30-1946 DOA: 02/06/2023 PCP: Ralene Ok, MD  77/M with history of severe OSA, right heart failure, chronic venous insufficiency, cor pulmonale, untreated OSA OHS, recurrent DVT, persistent A-fib presented to the ED with fatigue, dyspnea on exertion and fall. -Not very compliant with diuretics, has not been started on CPAP for severe sleep apnea -In the ED noted to have 1+ edema, hypoxic placed on 5 L O2, chest x-ray noted cardiomegaly, pulmonary vascular congestion and small pleural effusion, ABG noted hypercarbia, BNP 174, troponin 35, TSH 83 and free T4 less than 0.25 -Admitted, placed on BiPAP and diuretics, subsequently held with drop in blood pressure  Subjective: -Just taken off BiPAP this morning, reports feeling weak and dyspneic on exertion for few weeks, thinks he has gained a considerable amount of weight  Assessment and Plan:  Acute on chronic diastolic CHF Right heart failure, cor pulmonale -Has some edema and pulmonary vascular congestion and small effusions noted on x-ray -Continue IV Lasix today, add midodrine -Now off BiPAP, needs nightly CPAP -Volume status is very difficult to assess, will request cardiology input, patient reports approximately 20 to 30 pound weight gain -Will repeat echo, last echo in 8/22 noted preserved EF, severe LVH and normal RV -PT OT eval  Severe OSA/OHS -Recent sleep study with severe OSA, reportedly has not been started on CPAP, will resume  Severe hypothyroidism -TSH 83 with free T4 of 0.25, not on Synthroid at baseline, started IV Synthroid given severity of hypothyroidism, likely amiodarone induced -Will need repeat labs in 6 weeks  Paroxysmal atrial for flutter -Currently in sinus rhythm, amiodarone causing severe hypothyroidism, holding Amio, switch to apixaban  OSA/OHS -Add CPAP nightly, TOC consult for home CPAP -Weight loss recommended  Acute metabolic  encephalopathy -Likely from hypoxia and hypercarbia -Improving, monitor  Chronic venous insufficiency  DVT prophylaxis: Hep>Eliquis Code Status: Full code Family Communication: None present Disposition Plan: Home likely 2 to 3 days  Consultants:    Procedures:   Antimicrobials:    Objective: Vitals:   02/07/23 0958 02/07/23 1100 02/07/23 1128 02/07/23 1210  BP:  96/61 92/61   Pulse: 62 (!) 53 (!) 56 (!) 54  Resp: 20 19 16  (!) 21  Temp:  98.5 F (36.9 C)    TempSrc:  Axillary    SpO2: 97% 94% 96% 94%  Weight:      Height:        Intake/Output Summary (Last 24 hours) at 02/07/2023 1220 Last data filed at 02/07/2023 1100 Gross per 24 hour  Intake 92.51 ml  Output 2075 ml  Net -1982.49 ml   Filed Weights   02/06/23 2032 02/06/23 2111 02/07/23 0413  Weight: (!) 148.8 kg (!) 137.5 kg (!) 136.4 kg    Examination:  General exam: Obese chronically ill male sitting up in bed, AAO x 2 HEENT: Neck obese unable to assess JVD CVS: S1-S2, regular rhythm Lungs: Decreased breath sounds in the bases Abdomen: Soft, nontender, bowel sounds present Extremities: 1+ MR, chronic skin changes and hyperpigmentation Psychiatry: Flat affect    Data Reviewed:   CBC: Recent Labs  Lab 02/06/23 1434 02/06/23 1505 02/06/23 1628 02/06/23 1954 02/07/23 0054  WBC 4.5  --   --   --  5.3  NEUTROABS  --   --   --   --  3.1  HGB 16.0 18.4* 18.7* 18.7* 16.8  HCT 51.8 54.0* 55.0* 55.0* 52.2*  MCV 107.5*  --   --   --  106.3*  PLT 142*  --   --   --  149*   Basic Metabolic Panel: Recent Labs  Lab 02/06/23 1434 02/06/23 1505 02/06/23 1628 02/06/23 1954 02/06/23 2045 02/07/23 0600  NA 132* 129* 127* 130*  --  133*  K 4.6 4.7 4.2 4.2  --  5.5*  CL 80*  --   --   --   --  80*  CO2 42*  --   --   --   --  37*  GLUCOSE 90  --   --   --   --  65*  BUN 12  --   --   --   --  13  CREATININE 1.76*  --   --   --   --  1.48*  CALCIUM 8.9  --   --   --   --  9.2  MG  --   --   --   --   2.1  --    GFR: Estimated Creatinine Clearance: 56.5 mL/min (A) (by C-G formula based on SCr of 1.48 mg/dL (H)). Liver Function Tests: Recent Labs  Lab 02/06/23 1434 02/07/23 0600  AST 41 53*  ALT 15 17  ALKPHOS 56 55  BILITOT 1.5* 2.0*  PROT 6.5 6.6  ALBUMIN 3.3* 3.6   No results for input(s): "LIPASE", "AMYLASE" in the last 168 hours. Recent Labs  Lab 02/06/23 1705  AMMONIA 21   Coagulation Profile: No results for input(s): "INR", "PROTIME" in the last 168 hours. Cardiac Enzymes: No results for input(s): "CKTOTAL", "CKMB", "CKMBINDEX", "TROPONINI" in the last 168 hours. BNP (last 3 results) No results for input(s): "PROBNP" in the last 8760 hours. HbA1C: No results for input(s): "HGBA1C" in the last 72 hours. CBG: Recent Labs  Lab 02/06/23 1505  GLUCAP 86   Lipid Profile: No results for input(s): "CHOL", "HDL", "LDLCALC", "TRIG", "CHOLHDL", "LDLDIRECT" in the last 72 hours. Thyroid Function Tests: Recent Labs    02/06/23 1705 02/06/23 2045  TSH 83.727*  --   FREET4  --  <0.25*   Anemia Panel: No results for input(s): "VITAMINB12", "FOLATE", "FERRITIN", "TIBC", "IRON", "RETICCTPCT" in the last 72 hours. Urine analysis:    Component Value Date/Time   COLORURINE YELLOW 09/24/2020 1354   APPEARANCEUR CLEAR 09/24/2020 1354   LABSPEC 1.014 09/24/2020 1354   PHURINE 5.0 09/24/2020 1354   GLUCOSEU NEGATIVE 09/24/2020 1354   HGBUR SMALL (A) 09/24/2020 1354   BILIRUBINUR NEGATIVE 09/24/2020 1354   KETONESUR NEGATIVE 09/24/2020 1354   PROTEINUR NEGATIVE 09/24/2020 1354   NITRITE NEGATIVE 09/24/2020 1354   LEUKOCYTESUR NEGATIVE 09/24/2020 1354   Sepsis Labs: @LABRCNTIP (procalcitonin:4,lacticidven:4)  )No results found for this or any previous visit (from the past 240 hour(s)).   Radiology Studies: CT Head Wo Contrast  Result Date: 02/06/2023 CLINICAL DATA:  Mental status change, unknown cause EXAM: CT HEAD WITHOUT CONTRAST TECHNIQUE: Contiguous axial images  were obtained from the base of the skull through the vertex without intravenous contrast. RADIATION DOSE REDUCTION: This exam was performed according to the departmental dose-optimization program which includes automated exposure control, adjustment of the mA and/or kV according to patient size and/or use of iterative reconstruction technique. COMPARISON:  Head CT 09/24/2020 FINDINGS: Brain: No intracranial hemorrhage, mass effect, or midline shift. Age related atrophy. No hydrocephalus. The basilar cisterns are patent. Moderate periventricular and deep white matter hypodensity typical of chronic small vessel ischemic change. No evidence of territorial infarct or acute ischemia. No extra-axial or intracranial fluid collection. Vascular:  Atherosclerosis of skullbase vasculature without hyperdense vessel or abnormal calcification. Skull: No fracture or focal lesion. Sinuses/Orbits: Bilateral cataract resection. Mucosal thickening throughout the left maxillary sinus is new from prior exam. Minimal opacification of lower left mastoid air cells. Other: None. IMPRESSION: 1. No acute intracranial abnormality. 2. 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 3. Left maxillary sinus mucosal thickening, new from prior exam. Recommend correlation for symptoms of sinusitis. Electronically Signed   By: Narda Rutherford M.D.   On: 02/06/2023 16:19   DG Chest 2 View  Result Date: 02/06/2023 CLINICAL DATA:  Shortness of breath EXAM: CHEST - 2 VIEW COMPARISON:  X-ray and CT angiogram 03/18/2021 FINDINGS: Under penetrated radiographs. Motion on lateral view as well. Enlarged cardiopericardial silhouette with vascular congestion and small effusions. No pneumothorax. No obvious consolidation but limited x-rays. Overlapping cardiac leads. IMPRESSION: Limited x-rays. Enlarged heart with vascular congestion and small effusions. Recommend follow-up Electronically Signed   By: Karen Kays M.D.   On: 02/06/2023 15:11      Scheduled Meds:  apixaban  5 mg Oral BID   furosemide  40 mg Intravenous BID   ipratropium-albuterol  3 mL Nebulization Q6H   [START ON 02/08/2023] levothyroxine  50 mcg Intravenous Daily   midodrine  10 mg Oral BID WC   sodium chloride flush  3 mL Intravenous Q12H   Continuous Infusions:  sodium chloride       LOS: 1 day    Time spent:    Zannie Cove, MD [facility]   02/07/2023, 12:20 PM

## 2023-02-07 NOTE — Progress Notes (Addendum)
  Patient's blood pressure has been dropping to 89/59.  He got Bumex 2 mg around 5:30 PM in the emergency departmenT.  ABG showed hypercarbia 89 and hypoxia 70 patient placed on BiPAP around 6 PM.  Still on BiPAP for last 6 hours.  ABG showed slight improvement of pCO2 87 to 79 and O2 77 however patient is still is altered.  Responsive to sternal rub only. I have consulted pulmonary and critical care, spoken with Dr. Mechele Collin who gracefully agreed to evaluate the patient.  Appreciate it.

## 2023-02-07 NOTE — Progress Notes (Signed)
PT Cancellation Note  Patient Details Name: Shane Diaz MRN: [id number] DOB: 1945-12-03   Cancelled Treatment:    Reason Eval/Treat Not Completed: Medical issues which prohibited therapy (Pt is weaning off Bipap per nurse.  Will need to HOLD PT and check back as able.)   Bevelyn Buckles 02/07/2023, 10:13 AM Blanch Stang M,PT Acute Rehab Services [phone]

## 2023-02-07 NOTE — Progress Notes (Signed)
OT Cancellation Note  Patient Details Name: Shane Diaz MRN: [id number] DOB: 1945/09/09   Cancelled Treatment:    Reason Eval/Treat Not Completed: Medical issues which prohibited therapy. Pt with difficulties weaning off BiPap. OT will hold and check back as schedule allows.   Trish Mage, OTR/L [facility] Tyler Robidoux Elane Bing Plume 02/07/2023, 1:18 PM

## 2023-02-07 NOTE — Progress Notes (Signed)
   02/07/23 0958  BiPAP/CPAP/SIPAP  BiPAP/CPAP/SIPAP Pt Type Adult  BiPAP/CPAP/SIPAP V60 (RN called stating that the pt's mask was off, SP02 decreased in the 60s, RT went back to 3E16 and readjusted mask, lowered IPAP to 18, pt was safe with the bipap on at my last BIPAP check.  Unaware of who took the mask off.  Mask is secure, V60 safe.)  Mask Type Full face mask  Mask Size Large  Set Rate (S)  18 breaths/min (Weaned to IPAP 18, pt was complaining of the pressure too high.)  Respiratory Rate 20 breaths/min  IPAP 8 cmH20  FiO2 (%) 40 %  Minute Ventilation 22.5  Leak 38 (Adjusted mask.)  Peak Inspiratory Pressure (PIP) 20  Tidal Volume (Vt) 1098  Patient Home Equipment No  Auto Titrate No  Press High Alarm 30 cmH2O  Press Low Alarm 5 cmH2O  Nasal massage performed No (comment)  Oxygen Percent 40 %  BiPAP/CPAP /SiPAP Vitals  Pulse Rate 62  Resp 20  SpO2 97 %  Bilateral Breath Sounds Diminished  MEWS Score/Color  MEWS Score 2  MEWS Score Color Yellow

## 2023-02-07 NOTE — Significant Event (Signed)
Latest Reference Range & Units 02/07/23 06:00  COMPREHENSIVE METABOLIC PANEL  Rpt !  Sodium 135 - 145 mmol/L 133 (L)  Potassium 3.5 - 5.1 mmol/L 5.5 (H)  Chloride 98 - 111 mmol/L 80 (L)  CO2 22 - 32 mmol/L 37 (H)  Glucose 70 - 99 mg/dL 65 (L)  BUN 8 - 23 mg/dL 13  Creatinine 1.02 - 7.25 mg/dL 3.66 (H)  Calcium 8.9 - 10.3 mg/dL 9.2  Anion gap 5 - 15  16 (H)  Alkaline Phosphatase 38 - 126 U/L 55  Albumin 3.5 - 5.0 g/dL 3.6  AST 15 - 41 U/L 53 (H)  ALT 0 - 44 U/L 17  Total Protein 6.5 - 8.1 g/dL 6.6  Total Bilirubin 0.3 - 1.2 mg/dL 2.0 (H)  GFR, Estimated >60 mL/min 48 (L)  Heparin Unfractionated 0.30 - 0.70 IU/mL >1.10 (H)  APTT 24 - 36 seconds >200 (HH)  (HH): Data is critically high !: Data is abnormal (L): Data is abnormally low (H): Data is abnormally high Rpt: View report in Results Review for more information  Stopped Hep drip  Called Pharmacy and MD to make aware.

## 2023-02-07 NOTE — Progress Notes (Signed)
Patient's BP has been soft. This RN keeps on checking BP, latest was 94/56. MD notified and hold the due lasix tonight. RN made the patient aware regarding not getting his lasix tonight due to soft BP. Patient verbalized understanding.

## 2023-02-07 NOTE — Progress Notes (Signed)
PT Cancellation Note  Patient Details Name: Shane Diaz MRN: [id number] DOB: [birth year]   Cancelled Treatment:    Reason Eval/Treat Not Completed: Medical issues which prohibited therapy (Pt continues on Bipap.  Will HOLD until tomorrow.)   Bevelyn Buckles 02/07/2023, 1:16 PM Eveny Anastas M,PT Acute Rehab Services [phone]

## 2023-02-07 NOTE — Progress Notes (Signed)
Messaged by nurse regarding drop in O2 when try to come off of BiPAP, suspect this is result of untreated OSA and OSH. Checked on the patient, asleep on BIPAP. Since he is on 4mg  BID bumex at home, which equates to about 160mg  BID oral Lasix. Will give extra dose of 40mg  IV lasix now and change IV lasix to TID dosing. Plan to transition back to Bumex tomorrow

## 2023-02-07 NOTE — Progress Notes (Addendum)
ANTICOAGULATION CONSULT NOTE - Follow Up Consult  Pharmacy Consult for Heparin infusion Indication: atrial fibrillation  Allergies  Allergen Reactions   Diltiazem     rash   Amiodarone Rash    Patient Measurements: Height: 5\' 8"  (172.7 cm) Weight: (!) 136.4 kg (300 lb 11.2 oz) IBW/kg (Calculated) : 68.4 Heparin Dosing Weight: 102.5 kg  Vital Signs: Temp: 98.5 F (36.9 C) (07/06 1100) Temp Source: Axillary (07/06 1100) BP: 95/59 (07/06 2000) Pulse Rate: 59 (07/06 2000)  Labs: Recent Labs    02/06/23 1434 02/06/23 1505 02/06/23 1628 02/06/23 1954 02/06/23 2045 02/06/23 2045 02/06/23 2137 02/06/23 2206 02/07/23 0054 02/07/23 0600 02/07/23 0932 02/07/23 2024  HGB 16.0   < > 18.7* 18.7*  --   --   --   --  16.8  --   --   --   HCT 51.8   < > 55.0* 55.0*  --   --   --   --  52.2*  --   --   --   PLT 142*  --   --   --   --   --   --   --  149*  --   --   --   APTT  --   --   --   --  35   < >  --   --   --  >200* 172* 93*  HEPARINUNFRC  --   --   --   --   --   --  >1.10*  --   --  >1.10* >1.10*  --   CREATININE 1.76*  --   --   --   --   --   --   --   --  1.48*  --   --   TROPONINIHS  --   --   --   --  35*  --   --  28*  --   --   --   --    < > = values in this interval not displayed.     Estimated Creatinine Clearance: 56.5 mL/min (A) (by C-G formula based on SCr of 1.48 mg/dL (H)).   Assessment: 77 yo M presents with AMS, visual hallucinations x 2 weeks. Patient has a history of Afib and takes apixaban 5mg  po BID at home and last dose was taken on 7/4 at 18:00. Pharmacy consulted to dose heparin infusion for Afib.  Heparin drip 1500 uts/hr with heparin level >1.1 as expected - falsely elevated from apixaban  Aptt 175 sec - drawn from opposite arm as heparin infusion - greater than goal  Patient off Bipap so planned to resume apixaban but sats dropped and back on Bipap - no apixaban given yet in hospital.  aPTT 93 sec (therapeutic) on infusion at 1300  units/hr. No bleeding noted.  Goal of Therapy:  Heparin level 0.3-0.7 units/ml; aPTT 66-102 sec Monitor platelets by anticoagulation protocol: Yes   Plan:  Continue heparin 1300 uts/hr F/u daily heparin level and aPTT to confirm therapeutic  Christoper Fabian, PharmD, BCPS Please see amion for complete clinical pharmacist phone list 02/07/2023 9:02 PM

## 2023-02-07 NOTE — Significant Event (Signed)
Latest Reference Range & Units 02/07/23 09:32  Heparin Unfractionated 0.30 - 0.70 IU/mL >1.10 (H)  APTT 24 - 36 seconds 172 (HH)  (HH): Data is critically high (H): Data is abnormally high MD and Pharmacy aware. Pt will resume eliquis when able tolerate POs.

## 2023-02-07 NOTE — Progress Notes (Addendum)
ANTICOAGULATION CONSULT NOTE - Follow Up Consult  Pharmacy Consult for Heparin infusion Indication: atrial fibrillation  Allergies  Allergen Reactions   Diltiazem     rash   Amiodarone Rash    Patient Measurements: Height: 5\' 8"  (172.7 cm) Weight: (!) 136.4 kg (300 lb 11.2 oz) IBW/kg (Calculated) : 68.4 Heparin Dosing Weight: 102.5 kg  Vital Signs: Temp: 98.5 F (36.9 C) (07/06 1100) Temp Source: Axillary (07/06 1100) BP: 92/61 (07/06 1128) Pulse Rate: 54 (07/06 1210)  Labs: Recent Labs    02/06/23 1434 02/06/23 1505 02/06/23 1628 02/06/23 1954 02/06/23 2045 02/06/23 2137 02/06/23 2206 02/07/23 0054 02/07/23 0600 02/07/23 0932  HGB 16.0   < > 18.7* 18.7*  --   --   --  16.8  --   --   HCT 51.8   < > 55.0* 55.0*  --   --   --  52.2*  --   --   PLT 142*  --   --   --   --   --   --  149*  --   --   APTT  --   --   --   --  35  --   --   --  >200* 172*  HEPARINUNFRC  --   --   --   --   --  >1.10*  --   --  >1.10* >1.10*  CREATININE 1.76*  --   --   --   --   --   --   --  1.48*  --   TROPONINIHS  --   --   --   --  35*  --  28*  --   --   --    < > = values in this interval not displayed.     Estimated Creatinine Clearance: 56.5 mL/min (A) (by C-G formula based on SCr of 1.48 mg/dL (H)).   Medical History: Past Medical History:  Diagnosis Date   Acute diastolic heart failure (HCC) 08/03/2020   Arthritis    Diverticulosis 05/2015   Mild, noted on colonosocpy   Fatty liver 05/14/2015   noted on [place] ABD   History of colon polyps 05/2014   sessile in ascending colon, pedunculated in sigmoid colon, diverticulosis   Hyperlipidemia    pt denies   Hypertension    not currently taking medications per MD   OSA (obstructive sleep apnea) 08/03/2020   Renal cyst 05/2014   Small, Left, noted on [place] ABD   Umbilical hernia    Varicose veins     Medications:  Medications Prior to Admission  Medication Sig Dispense Refill Last Dose   amiodarone (PACERONE) 200 MG  tablet TAKE ONE TABLET BY MOUTH DAILY 90 tablet 3 02/05/2023   apixaban (ELIQUIS) 5 MG TABS tablet Take 1 tablet (5 mg total) by mouth 2 (two) times daily. 180 tablet 3 02/05/2023 at 1800   bumetanide (BUMEX) 2 MG tablet TAKE 2 TABLETS BY MOUTH TWICE A DAY 120 tablet 2 02/05/2023   eplerenone (INSPRA) 25 MG tablet Take 1 tablet (25 mg total) by mouth daily. 30 tablet 6 02/06/2023   metoprolol succinate (TOPROL-XL) 50 MG 24 hr tablet TAKE ONE TABLET BY MOUTH DAILY WITH OR IMMEDIATELY FOLLOWING A MEAL 90 tablet 3 02/05/2023 at 1800   nitroGLYCERIN (NITROSTAT) 0.4 MG SL tablet Place 1 tablet (0.4 mg total) under the tongue every 5 (five) minutes x 3 doses as needed for chest pain. 25 tablet 4 Unknown  Assessment: 77 yo M presents with AMS, visual hallucinations x 2 weeks. Patient has a history of Afib and takes apixaban 5mg  po BID at home and last dose was taken on 7/4 at 18:00. Pharmacy consulted to dose heparin infusion for Afib.  Heparin drip 1500 uts/hr with heparin level >1.1 as epected - falsely elevated from apixaban  Aptt 175 sec - drawn from opposite arm as heparin infusion - greater than goal No s/sx of bleeding, cbc stable Patient off Bipap  planned to resume apixaban but Sats dropped and back on Bipap Continue heparin today Goal of Therapy:  Heparin level 0.3-0.7 units/ml Monitor platelets by anticoagulation protocol: Yes   Plan:  Restart heparin 1300 uts/hr Draw aptt in 6hours from start  Daily aptt and heparin level  Monitor s/s bleeding     Leota Sauers Pharm.D. CPP, BCPS Clinical Pharmacist [phone] 02/07/2023 12:35 PM    Please refer to [facility] for pharmacy phone number

## 2023-02-07 NOTE — Progress Notes (Signed)
PCCM called to evaluate patient for respiratory failure and concern for worsening hypercarbia. Admitted w/ CHF exacerbation. Started on bipap and bumex. Per MD patient more lethargic. On arrival patient arouses w/ stimulation; Aox3; MAE. On bipap 20/8 40% w/ sats 94%. BP stable w/ map 70s. ABG showing improving in paco2 from 94 to 79. Dr. Everardo All and I have evaluated patient and no need for icu at this time. Continue bipap and diuresis per primary and PCCM available as needed.  JD Anselm Lis [facility] 02/07/2023, 12:55 AM  Please see [email or web address] for pager details.  From 7A-7P if no response, please call [phone]. After hours, please call ELink [phone].

## 2023-02-07 NOTE — Consult Note (Addendum)
Cardiology Consultation   Patient ID: Shane Diaz MRN: [id number]; DOB: 18-Dec-1945  Admit date: 02/06/2023 Date of Consult: 02/07/2023  PCP:  Ralene Ok, MD   [facility] Providers Cardiologist:  Thurmon Fair, MD        Patient Profile:   Shane Diaz is a 77 y.o. male with a hx of severe OSA, severe venous insufficiency, right heart failure with cor pulmonale secondary to untreated obstructive sleep apnea and obesity hypoventilation syndrome, recurrent DVT (2015, 2016 and 2020), history of persistent atrial fibrillation status post multiple cardioversion in December 2021 with recurrent atrial flutter on amiodarone who is being seen 02/07/2023 for the evaluation of fatigue and possible volume overload at the request of Dr. Jomarie Longs.  History of Present Illness:   Shane Diaz is a morbidly obese 77 year old male with past medical history of severe OSA, severe venous insufficiency, right heart failure with cor pulmonale secondary to untreated obstructive sleep apnea and obesity hypoventilation syndrome, recurrent DVT (2015, 2016 and 2020), history of persistent atrial fibrillation status post multiple cardioversion in December 2021 with recurrent atrial flutter on amiodarone.  Last echocardiogram obtained on 03/19/2021 showed EF 55%, severe LVH, no regional wall motion abnormality, moderate LAE, trivial MR.  Last venous Doppler obtained in February 2023 was negative for DVT.  According to previous office note, he has poor compliance with dietary discretion and loves to eat cured meat and pickles.  He is supposed to be on as needed oxygen at night.  His O2 saturation in the office is frequently in the 80s.  He has been previously advised by Dr. Royann Shivers to use oxygen throughout the day at night to avoid chronic hypoxemia.  He has been maintaining sinus rhythm since last cardioversion in May 2023.  He last underwent sleep study in March 2024 and was diagnosed with severe obstructive sleep  apnea with nocturnal hypoxemia.  According to the patient, he still does not wear CPAP therapy at night.  He has been on Bumex at home 2 mg twice a day which he has been compliant with.  He gets short of breath after walking 10 feet, this has not changed significantly.  For the past month, he has been having gradual worsening weakness.  His bedroom to the bathroom is only 10 feet and he would fall down walking to the bathroom.  This prompted the patient to seek urgent medical attention at [facility] on 02/06/2023.  On arrival, he reportedly had 1+ pitting edema.  Blood pressure was 97% on 4 L nasal cannula.  However shortly after arrival, O2 saturation dropped down to 86% and nasal cannula was increased to 5 L without improvement.  Chest x-ray showed enlarged heart with vascular congestion and a small effusion.  CT was negative for acute process.  Arterial blood gas showed pH 7.34, pCO2 85. BNP was 174. Trop 35 --> 28. D-dimer 0.63. TSH 83 and free T4 < 025. EKG showed sinus bradycardia with RBBB.  He was started on levothyroxine patient was given IV Bumex 2 mg and followed by 80 mg of IV Lasix hospital arrival.  So far urine output was 1.4 L.  Talking with the patient, he is able to sleep flat at night.  His shortness of breath has not really changed in the past 2 months.  He is primary concern when he arrived was the fatigue that made him fall.  Cardiology service was consulted for volume overload.   Past Medical History:  Diagnosis Date   Acute  diastolic heart failure (HCC) 08/03/2020   Arthritis    Diverticulosis 05/2015   Mild, noted on colonosocpy   Fatty liver 05/14/2015   noted on [place] ABD   History of colon polyps 05/2014   sessile in ascending colon, pedunculated in sigmoid colon, diverticulosis   Hyperlipidemia    pt denies   Hypertension    not currently taking medications per MD   OSA (obstructive sleep apnea) 08/03/2020   Renal cyst 05/2014   Small, Left, noted on [place] ABD    Umbilical hernia    Varicose veins     Past Surgical History:  Procedure Laterality Date   APPENDECTOMY     childhood   BIOPSY  07/11/2020   Procedure: BIOPSY;  Surgeon: Meryl Dare, MD;  Location: [facility] ENDOSCOPY;  Service: Endoscopy;;   CARDIOVERSION N/A 07/13/2020   Procedure: CARDIOVERSION;  Surgeon: Wendall Stade, MD;  Location: [facility] ENDOSCOPY;  Service: Cardiovascular;  Laterality: N/A;   CARDIOVERSION N/A 08/02/2020   Procedure: CARDIOVERSION;  Surgeon: Christell Constant, MD;  Location: [facility] ENDOSCOPY;  Service: Cardiovascular;  Laterality: N/A;   CARDIOVERSION N/A 12/12/2021   Procedure: CARDIOVERSION;  Surgeon: Pricilla Riffle, MD;  Location: [facility] ENDOSCOPY;  Service: Cardiovascular;  Laterality: N/A;   COLONOSCOPY W/ POLYPECTOMY  05/23/2014   COLONOSCOPY WITH PROPOFOL N/A 07/11/2020   Procedure: COLONOSCOPY WITH PROPOFOL;  Surgeon: Meryl Dare, MD;  Location: [facility] ENDOSCOPY;  Service: Endoscopy;  Laterality: N/A;   ESOPHAGOGASTRODUODENOSCOPY (EGD) WITH PROPOFOL N/A 07/11/2020   Procedure: ESOPHAGOGASTRODUODENOSCOPY (EGD) WITH PROPOFOL;  Surgeon: Meryl Dare, MD;  Location: [facility] ENDOSCOPY;  Service: Endoscopy;  Laterality: N/A;   TEE WITHOUT CARDIOVERSION N/A 07/13/2020   Procedure: TRANSESOPHAGEAL ECHOCARDIOGRAM (TEE);  Surgeon: Wendall Stade, MD;  Location: [facility] ENDOSCOPY;  Service: Cardiovascular;  Laterality: N/A;   UMBILICAL HERNIA REPAIR N/A 04/27/2018   Procedure: REPAIR UMBILICAL HERNIA ERAS PATHWAY;  Surgeon: Ovidio Kin, MD;  Location: [facility] ORS;  Service: General;  Laterality: N/A;   varicose veins     WRIST SURGERY       Home Medications:  Prior to Admission medications   Medication Sig Start Date End Date Taking? Authorizing Provider  amiodarone (PACERONE) 200 MG tablet TAKE ONE TABLET BY MOUTH DAILY 05/13/22  Yes [name], [name], MD  apixaban (ELIQUIS) 5 MG TABS tablet Take 1 tablet (5 mg total) by mouth 2 (two) times daily. 10/23/22  Yes [name], [name], MD   bumetanide (BUMEX) 2 MG tablet TAKE 2 TABLETS BY MOUTH TWICE A DAY 01/12/23  Yes [name], [name], MD  eplerenone (INSPRA) 25 MG tablet Take 1 tablet (25 mg total) by mouth daily. 12/26/22  Yes Rennis Chris, MD  metoprolol succinate (TOPROL-XL) 50 MG 24 hr tablet TAKE ONE TABLET BY MOUTH DAILY WITH OR IMMEDIATELY FOLLOWING A MEAL 06/17/22  Yes [name], [name], MD  nitroGLYCERIN (NITROSTAT) 0.4 MG SL tablet Place 1 tablet (0.4 mg total) under the tongue every 5 (five) minutes x 3 doses as needed for chest pain. 12/04/21   [name], [name], MD    Inpatient Medications: Scheduled Meds:  amiodarone  200 mg Oral Daily   apixaban  5 mg Oral BID   furosemide  40 mg Intravenous BID   ipratropium-albuterol  3 mL Nebulization Q6H   levothyroxine  12.5 mcg Intravenous Daily   midodrine  10 mg Oral BID WC   sodium chloride flush  3 mL Intravenous Q12H   Continuous Infusions:  sodium chloride     PRN Meds: sodium chloride,  acetaminophen **OR** acetaminophen, albuterol, hydrALAZINE, ondansetron **OR** ondansetron (ZOFRAN) IV, sodium chloride flush  Allergies:    Allergies  Allergen Reactions   Diltiazem     rash   Amiodarone Rash    Social History:   Social History   Socioeconomic History   Marital status: Married    Spouse name: Not on file   Number of children: 0   Years of education: Not on file   Highest education level: Not on file  Occupational History   Not on file  Tobacco Use   Smoking status: Never   Smokeless tobacco: Never  Vaping Use   Vaping Use: Never used  Substance and Sexual Activity   Alcohol use: Yes    Alcohol/week: 0.0 standard drinks of alcohol    Comment: occassional wine, martini   Drug use: No   Sexual activity: Not on file  Other Topics Concern   Not on file  Social History Narrative   Not on file   Social Determinants of Health   Financial Resource Strain: Not on file  Food Insecurity: Not on file  Transportation Needs: Not on file  Physical  Activity: Not on file  Stress: Not on file  Social Connections: Not on file  Intimate Partner Violence: Not on file    Family History:    Family History  Problem Relation Age of Onset   Colon cancer Neg Hx    Esophageal cancer Neg Hx    Pancreatic cancer Neg Hx    Prostate cancer Neg Hx    Rectal cancer Neg Hx    Stomach cancer Neg Hx      ROS:  Please see the history of present illness.   All other ROS reviewed and negative.     Physical Exam/Data:   Vitals:   02/07/23 0722 02/07/23 0823 02/07/23 0958 02/07/23 1100  BP: (!) 89/64   96/61  Pulse: (!) 59 (!) 58 62 (!) 53  Resp: 20 20 20 19   Temp:    98.5 F (36.9 C)  TempSrc:    Axillary  SpO2: 94% 97% 97% 94%  Weight:      Height:        Intake/Output Summary (Last 24 hours) at 02/07/2023 1140 Last data filed at 02/07/2023 1100 Gross per 24 hour  Intake 92.51 ml  Output 2075 ml  Net -1982.49 ml      02/07/2023    4:13 AM 02/06/2023    9:11 PM 02/06/2023    8:32 PM  Last 3 Weights  Weight (lbs) 300 lb 11.2 oz 303 lb 1.6 oz 328 lb  Weight (kg) 136.397 kg 137.485 kg 148.78 kg     Body mass index is 45.72 kg/m.  General: Morbidly obese, on BiPAP HEENT: normal Neck: no JVD Vascular: No carotid bruits; Distal pulses 2+ bilaterally Cardiac:  normal S1, S2; RRR; no murmur  Lungs:  clear to auscultation bilaterally, no wheezing, rhonchi or rales  Abd: soft, nontender, no hepatomegaly  Ext: Brown discoloration in the lower extremity consistent with venous insufficiency. Musculoskeletal:  No deformities, BUE and BLE strength normal and equal Skin: warm and dry  Neuro:  CNs 2-12 intact, no focal abnormalities noted Psych:  Normal affect   EKG:  The EKG was personally reviewed and demonstrates: Sinus rhythm, right bundle branch block. Telemetry:  Telemetry was personally reviewed and demonstrates: Sinus bradycardia, heart rate 50 to 60s.  Relevant CV Studies:  Echo 03/19/2021  1. Left ventricular ejection fraction,  by estimation, is 55%.  The left  ventricle has normal function. The left ventricle has no regional wall  motion abnormalities. There is severe left ventricular hypertrophy. Left  ventricular diastolic parameters are  indeterminate.   2. Right ventricular systolic function is normal. The right ventricular  size is normal.   3. Left atrial size was moderately dilated.   4. The mitral valve is abnormal. Trivial mitral valve regurgitation. No  evidence of mitral stenosis.   5. The aortic valve is tricuspid. Aortic valve regurgitation is not  visualized. Mild to moderate aortic valve sclerosis/calcification is  present, without any evidence of aortic stenosis.   6. The inferior vena cava is normal in size with greater than 50%  respiratory variability, suggesting right atrial pressure of 3 mmHg.    Laboratory Data:  High Sensitivity Troponin:   Recent Labs  Lab 02/06/23 2045 02/06/23 2206  TROPONINIHS 35* 28*     Chemistry Recent Labs  Lab 02/06/23 1434 02/06/23 1505 02/06/23 1628 02/06/23 1954 02/06/23 2045 02/07/23 0600  NA 132*   < > 127* 130*  --  133*  K 4.6   < > 4.2 4.2  --  5.5*  CL 80*  --   --   --   --  80*  CO2 42*  --   --   --   --  37*  GLUCOSE 90  --   --   --   --  65*  BUN 12  --   --   --   --  13  CREATININE 1.76*  --   --   --   --  1.48*  CALCIUM 8.9  --   --   --   --  9.2  MG  --   --   --   --  2.1  --   GFRNONAA 39*  --   --   --   --  48*  ANIONGAP 10  --   --   --   --  16*   < > = values in this interval not displayed.    Recent Labs  Lab 02/06/23 1434 02/07/23 0600  PROT 6.5 6.6  ALBUMIN 3.3* 3.6  AST 41 53*  ALT 15 17  ALKPHOS 56 55  BILITOT 1.5* 2.0*   Lipids No results for input(s): "CHOL", "TRIG", "HDL", "LABVLDL", "LDLCALC", "CHOLHDL" in the last 168 hours.  Hematology Recent Labs  Lab 02/06/23 1434 02/06/23 1505 02/06/23 1628 02/06/23 1954 02/07/23 0054  WBC 4.5  --   --   --  5.3  RBC 4.82  --   --   --  4.91  HGB  16.0   < > 18.7* 18.7* 16.8  HCT 51.8   < > 55.0* 55.0* 52.2*  MCV 107.5*  --   --   --  106.3*  MCH 33.2  --   --   --  34.2*  MCHC 30.9  --   --   --  32.2  RDW 15.7*  --   --   --  15.9*  PLT 142*  --   --   --  149*   < > = values in this interval not displayed.   Thyroid  Recent Labs  Lab 02/06/23 1705 02/06/23 2045  TSH 83.727*  --   FREET4  --  <0.25*    BNP Recent Labs  Lab 02/06/23 1705  BNP 174.1*    DDimer  Recent Labs  Lab 02/06/23 2045  DDIMER 0.63*  Radiology/Studies:  CT Head Wo Contrast  Result Date: 02/06/2023 CLINICAL DATA:  Mental status change, unknown cause EXAM: CT HEAD WITHOUT CONTRAST TECHNIQUE: Contiguous axial images were obtained from the base of the skull through the vertex without intravenous contrast. RADIATION DOSE REDUCTION: This exam was performed according to the departmental dose-optimization program which includes automated exposure control, adjustment of the mA and/or kV according to patient size and/or use of iterative reconstruction technique. COMPARISON:  Head CT 09/24/2020 FINDINGS: Brain: No intracranial hemorrhage, mass effect, or midline shift. Age related atrophy. No hydrocephalus. The basilar cisterns are patent. Moderate periventricular and deep white matter hypodensity typical of chronic small vessel ischemic change. No evidence of territorial infarct or acute ischemia. No extra-axial or intracranial fluid collection. Vascular: Atherosclerosis of skullbase vasculature without hyperdense vessel or abnormal calcification. Skull: No fracture or focal lesion. Sinuses/Orbits: Bilateral cataract resection. Mucosal thickening throughout the left maxillary sinus is new from prior exam. Minimal opacification of lower left mastoid air cells. Other: None. IMPRESSION: 1. No acute intracranial abnormality. 2. 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 3. Left maxillary sinus mucosal thickening, new from prior exam. Recommend  correlation for symptoms of sinusitis. Electronically Signed   By: Narda Rutherford M.D.   On: 02/06/2023 16:19   DG Chest 2 View  Result Date: 02/06/2023 CLINICAL DATA:  Shortness of breath EXAM: CHEST - 2 VIEW COMPARISON:  X-ray and CT angiogram 03/18/2021 FINDINGS: Under penetrated radiographs. Motion on lateral view as well. Enlarged cardiopericardial silhouette with vascular congestion and small effusions. No pneumothorax. No obvious consolidation but limited x-rays. Overlapping cardiac leads. IMPRESSION: Limited x-rays. Enlarged heart with vascular congestion and small effusions. Recommend follow-up Electronically Signed   By: Karen Kays M.D.   On: 02/06/2023 15:11     Assessment and Plan:   Fatigue: Patient was found to have severe hypothyroidism with TSH 83.7, free T4 less than 0.25.  Managed by primary service.  Likely contributing to worsening fatigue.  On exam, there was no lower extremity edema after overnight diuresis.  He is lung is clear on physical exam.  There was no obvious JVD.  History of right heart failure with cor pulmonale: Patient appears to be at baseline when comes to volume status.  Primary contributor to fatigue likely hypothyroidism.  Hypotension: Mild hypotension overnight after IV diuresis.  Placed on midodrine.  IV Lasix lowered to 40 mg twice a day.  Reasonable to transition to home dose of Bumex.  Chronic respiratory failure with hypoxemia: Secondary to obesity ventilation syndrome.  Has frequent nocturnal hypoxia  Severe untreated obstructive sleep apnea: Followed by Dr. Mayford Knife.  Has not been very compliant with CPAP therapy.  Severe venous insufficiency: No significant lower extremity edema  Persistent A-fib/atrial flutter: Maintaining sinus rhythm since May 2023.  He is on amiodarone.  Found to have new hide severe hypothyroidism.  However without amiodarone, his choice of antiarrhythmic is going to be very limited.  He likely will not do well in A-fib or  atrial flutter.    Risk Assessment/Risk Scores:     CHA2DS2-VASc Score = 4   This indicates a 4.8% annual risk of stroke. The patient's score is based upon: CHF History: 1 HTN History: 1 Diabetes History: 0 Stroke History: 0 Vascular Disease History: 0 Age Score: 2 Gender Score: 0    For questions or updates, please contact [facility] Please consult [email or web address] for contact info under   Signed, Azalee Course, PA  02/07/2023 11:40 AM As  above, patient seen and examined.  Briefly he is a 77 year old male with past medical history of severe obstructive sleep apnea, right heart failure felt secondary to obesity hypoventilation/OSA, paroxysmal atrial fibrillation/flutter, hypertension for evaluation of acute on chronic right-sided heart failure.  Patient has chronic dyspnea on exertion.  He states that is unchanged.  He becomes dyspneic with walking 10 feet.  He denies orthopnea or PND.  He has chronic pedal edema but states this is actually improved compared to previous.  He has not had chest pain, palpitations or syncope.  He complains predominantly of lack of energy and fatigue.  He was admitted and cardiology asked to evaluate.  Chest x-ray shows vascular congestion and small pleural effusions.  Creatinine 1.48, BUN 13, BNP 174, troponin 35 and 28, hemoglobin 16.8, TSH 83.727, free T4 less than 0.25.  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 1 acute on chronic diastolic/right heart failure-patient does not appear to be markedly volume overloaded on examination.  He states his dyspnea is at baseline and his pedal edema is actually improved.  He also states he has lost 10 pounds recently.  Will continue Lasix 40 mg IV twice daily today and likely transition to home dose of Bumex tomorrow morning.  2 hypothyroidism-TSH is markedly elevated and free T4 less than 0.25.  This is likely the cause of his  generalized weakness and fatigue.  I agree with initiating levothyroxine.  3 paroxysmal atrial fibrillation/flutter-he is in sinus rhythm.  I am concerned that amiodarone is likely causing his hypothyroidism.  Note TSH July 02, 2022 7.820.  Free T4 was normal.  I will hold amiodarone for now.  CHA2DS2-VASc 4.  Continue apixaban.  4 obstructive sleep apnea-needs to be compliant with CPAP at home.  5 obesity hypoventilation syndrome-needs weight loss.  Olga Millers, MD

## 2023-02-08 ENCOUNTER — Inpatient Hospital Stay (HOSPITAL_COMMUNITY): Payer: Medicare Other

## 2023-02-08 DIAGNOSIS — I5033 Acute on chronic diastolic (congestive) heart failure: Secondary | ICD-10-CM

## 2023-02-08 DIAGNOSIS — I5031 Acute diastolic (congestive) heart failure: Secondary | ICD-10-CM | POA: Diagnosis not present

## 2023-02-08 LAB — ECHOCARDIOGRAM COMPLETE
AR max vel: 2.33 cm2
AV Peak grad: 5.2 mmHg
Ao pk vel: 1.14 m/s
Area-P 1/2: 2.91 cm2
Height: 68 in
S' Lateral: 3.4 cm
Weight: 4729.6 oz

## 2023-02-08 LAB — BASIC METABOLIC PANEL
Anion gap: 10 (ref 5–15)
BUN: 13 mg/dL (ref 8–23)
CO2: 39 mmol/L — ABNORMAL HIGH (ref 22–32)
Calcium: 9 mg/dL (ref 8.9–10.3)
Chloride: 82 mmol/L — ABNORMAL LOW (ref 98–111)
Creatinine, Ser: 1.64 mg/dL — ABNORMAL HIGH (ref 0.61–1.24)
GFR, Estimated: 43 mL/min — ABNORMAL LOW (ref >60)
Glucose, Bld: 88 mg/dL (ref 70–99)
Potassium: 3.7 mmol/L (ref 3.5–5.1)
Sodium: 131 mmol/L — ABNORMAL LOW (ref 135–145)

## 2023-02-08 LAB — CBC
HCT: 51.9 % (ref 39.0–52.0)
Hemoglobin: 16.6 g/dL (ref 13.0–17.0)
MCH: 33.9 pg (ref 26.0–34.0)
MCHC: 32 g/dL (ref 30.0–36.0)
MCV: 105.9 fL — ABNORMAL HIGH (ref 80.0–100.0)
Platelets: 147 10*3/uL — ABNORMAL LOW (ref 150–400)
RBC: 4.9 MIL/uL (ref 4.22–5.81)
RDW: 15.9 % — ABNORMAL HIGH (ref 11.5–15.5)
WBC: 5.3 10*3/uL (ref 4.0–10.5)
nRBC: 0 % (ref 0.0–0.2)

## 2023-02-08 LAB — APTT
aPTT: 115 seconds — ABNORMAL HIGH (ref 24–36)
aPTT: 111 seconds — ABNORMAL HIGH (ref 24–36)

## 2023-02-08 LAB — HEPARIN LEVEL (UNFRACTIONATED): Heparin Unfractionated: >1.1 IU/mL — ABNORMAL HIGH (ref 0.30–0.70)

## 2023-02-08 MED ORDER — HEPARIN (PORCINE) 25000 UT/250ML-% IV SOLN: 1100.0000 [IU]/h | INTRAVENOUS | Status: AC

## 2023-02-08 MED ORDER — APIXABAN 5 MG PO TABS
5.0000 mg | ORAL_TABLET | Freq: Two times a day (BID) | ORAL | Status: DC
  Administered 2023-02-08 – 2023-02-12 (×8): 5 mg via ORAL
  Filled 2023-02-08 (×8): qty 1

## 2023-02-08 MED ORDER — FUROSEMIDE 10 MG/ML IJ SOLN
80.0000 mg | Freq: Two times a day (BID) | INTRAMUSCULAR | Status: DC
  Administered 2023-02-08 – 2023-02-09 (×2): 80 mg via INTRAVENOUS
  Filled 2023-02-08 (×2): qty 8

## 2023-02-08 MED ORDER — PERFLUTREN LIPID MICROSPHERE
1.0000 mL | INTRAVENOUS | Status: AC | PRN
  Administered 2023-02-08: 3 mL via INTRAVENOUS

## 2023-02-08 NOTE — Evaluation (Signed)
Physical Therapy Evaluation Patient Details Name: Shane Diaz MRN: [id number] DOB: 03-19-1946 Today's Date: 02/08/2023  History of Present Illness  The pt is a 77 yo male presenting 7/5 with AMS, visual hallucinations, generalized weakness. Found to have acute CHF exacerbation, also requiring BiPAP due to hypercapnia and hypoxia. PMH includes: morbid obesity, HTN, afib s/p cardioversion x2 in 2021, atrial flutter with AV block, obesity hypoventilation syndrome on 2L O2, OSA, chronic hypoxic resp failure, venous insufficiency, DVT.   Clinical Impression  Pt in bed upon arrival of PT, agreeable to evaluation at this time. The pt reports he was independent, driving, and mobilizing in the community until 2 weeks ago, after his fall his mobility has declined and he has been limited to short distance mobility in the home with significant SOB and need for assist from his wife. The pt required modA of 1-2 to complete bed mobility and initial sit-stand transfers this session, but was unable to progress to gait training due to fatigue in LE after 15-30 sec static stance. The pt now presents with limitations in functional mobility, strength, ROM, power, activity tolerance, and stability due to above dx, and will continue to benefit from skilled PT to address these deficits. VSS on 7L O2 for session. He is highly motivated to return to independence, will benefit from short stint of intensive therapies to allow for full return to independence with little burden on his wife to physically assist.     Assistance Recommended at Discharge Frequent or constant Supervision/Assistance  If plan is discharge home, recommend the following:  Can travel by private vehicle  Two people to help with walking and/or transfers;A lot of help with bathing/dressing/bathroom;Assistance with cooking/housework;Direct supervision/assist for medications management;Direct supervision/assist for financial management;Assist for  transportation;Help with stairs or ramp for entrance        Equipment Recommendations Rolling walker (2 wheels);BSC/3in1  Recommendations for Other Services  Rehab consult    Functional Status Assessment Patient has had a recent decline in their functional status and demonstrates the ability to make significant improvements in function in a reasonable and predictable amount of time.     Precautions / Restrictions Precautions Precautions: Fall Precaution Comments: watch O2, on 7L this session Restrictions Weight Bearing Restrictions: No      Mobility  Bed Mobility Overal bed mobility: Needs Assistance Bed Mobility: Supine to Sit, Sit to Supine     Supine to sit: Mod assist, HOB elevated Sit to supine: Mod assist, +2 for physical assistance   General bed mobility comments: pt able to initiate movement to EOB with modA, completed scooting with some assist with pad. modA of 2 to manage return to supine and repositioning. pt with poor body awareness and unable to reposition without significant cues    Transfers Overall transfer level: Needs assistance Equipment used: 2 person hand held assist Transfers: Sit to/from Stand Sit to Stand: Mod assist, +2 physical assistance           General transfer comment: moda to rise and steady, maintained standing for 15-30 seconds. no buckling in stance. VSS but pt reports fatigued    Ambulation/Gait               General Gait Details: pt unable this session    Balance Overall balance assessment: Needs assistance Sitting-balance support: No upper extremity supported, Feet supported Sitting balance-Leahy Scale: Fair     Standing balance support: Bilateral upper extremity supported, During functional activity Standing balance-Leahy Scale: Poor Standing balance comment: dependent on  UE support and external support                             Pertinent Vitals/Pain Pain Assessment Pain Assessment: Faces Faces  Pain Scale: Hurts a little bit Pain Location: generalized grimace with movement Pain Descriptors / Indicators: Grimacing Pain Intervention(s): Limited activity within patient's tolerance, Monitored during session, Repositioned    Home Living Family/patient expects to be discharged to:: Private residence Living Arrangements: Spouse/significant other Available Help at Discharge: Family;Available 24 hours/day Type of Home: House Home Access: Stairs to enter Entrance Stairs-Rails: Right;Left;Can reach both Entrance Stairs-Number of Steps: 3   Home Layout: One level Home Equipment: Agricultural consultant (2 wheels)      Prior Function Prior Level of Function : Independent/Modified Independent             Mobility Comments: pt uses RW to walk short distances, reports 2 falls this year ADLs Comments: pt reports sponge bath at baseline, wanting to renovate bathroom.     Hand Dominance        Extremity/Trunk Assessment   Upper Extremity Assessment Upper Extremity Assessment: Defer to OT evaluation    Lower Extremity Assessment Lower Extremity Assessment: RLE deficits/detail;LLE deficits/detail RLE Deficits / Details: chronic LE wounds and edema, red/purple in color below knee. pt with good anti-gravity movements and power for standing RLE Sensation: WNL RLE Coordination: WNL LLE Deficits / Details: chronic LE wounds and edema, red/purple in color below knee. pt with good anti-gravity movements and power for standing LLE Sensation: WNL LLE Coordination: WNL    Cervical / Trunk Assessment Cervical / Trunk Assessment: Other exceptions Cervical / Trunk Exceptions: large body habitus  Communication   Communication: No difficulties  Cognition Arousal/Alertness: Awake/alert Behavior During Therapy: WFL for tasks assessed/performed Overall Cognitive Status: No family/caregiver present to determine baseline cognitive functioning                                 General  Comments: pt able to follow all instructions given in session, express concerns regarding safety, and express needs. answering questions appropriately. not formally assessed        General Comments General comments (skin integrity, edema, etc.): SpO2 stable on 7L, BP stable despite pt reports of dizziness with changes in position        Assessment/Plan    PT Assessment Patient needs continued PT services  PT Problem List Decreased strength;Decreased activity tolerance;Decreased balance;Decreased mobility;Decreased safety awareness;Cardiopulmonary status limiting activity;Obesity       PT Treatment Interventions DME instruction;Gait training;Stair training;Functional mobility training;Therapeutic activities;Therapeutic exercise;Balance training;Patient/family education    PT Goals (Current goals can be found in the Care Plan section)  Acute Rehab PT Goals Patient Stated Goal: return to independence PT Goal Formulation: With patient Time For Goal Achievement: 02/22/23 Potential to Achieve Goals: Good    Frequency Min 1X/week     Co-evaluation PT/OT/SLP Co-Evaluation/Treatment: Yes Reason for Co-Treatment: Necessary to address cognition/behavior during functional activity;For patient/therapist safety;To address functional/ADL transfers PT goals addressed during session: Mobility/safety with mobility;Balance;Strengthening/ROM         AM-PAC PT "6 Clicks" Mobility  Outcome Measure Help needed turning from your back to your side while in a flat bed without using bedrails?: A Lot Help needed moving from lying on your back to sitting on the side of a flat bed without using bedrails?: A Lot Help needed moving  to and from a bed to a chair (including a wheelchair)?: Total Help needed standing up from a chair using your arms (e.g., wheelchair or bedside chair)?: Total Help needed to walk in hospital room?: Total Help needed climbing 3-5 steps with a railing? : Total 6 Click Score:  8    End of Session Equipment Utilized During Treatment: Gait belt;Oxygen Activity Tolerance: Patient tolerated treatment well Patient left: in bed;with call bell/phone within reach;with bed alarm set Nurse Communication: Mobility status PT Visit Diagnosis: Unsteadiness on feet (R26.81);Other abnormalities of gait and mobility (R26.89);Muscle weakness (generalized) (M62.81)    Time: 4098-1191 PT Time Calculation (min) (ACUTE ONLY): 29 min   Charges:   PT Evaluation $PT Eval Low Complexity: 1 Low   PT General Charges $$ ACUTE PT VISIT: 1 Visit         Vickki Muff, PT, DPT   Acute Rehabilitation Department Office [phone] Secure Chat Communication Preferred  Ronnie Derby 02/08/2023, 3:09 PM

## 2023-02-08 NOTE — Progress Notes (Signed)
Echocardiogram 2D Echocardiogram has been performed.  Shane Diaz 02/08/2023, 12:15 PM

## 2023-02-08 NOTE — Progress Notes (Addendum)
PROGRESS NOTE    Shane Diaz  WUJ:[id number] DOB: February 22, 1946 DOA: 02/06/2023 PCP: Ralene Ok, MD  77/M with history of severe OSA, right heart failure, chronic venous insufficiency, cor pulmonale, untreated OSA OHS, chronic respiratory failure on home O2 recurrent DVT, persistent A-fib presented to the ED with fatigue, dyspnea on exertion and fall. -Not very compliant with diuretics, has not been started on CPAP for severe sleep apnea -In the ED noted to have 1+ edema, hypoxic placed on 5 L O2, chest x-ray noted cardiomegaly, pulmonary vascular congestion and small pleural effusion, ABG noted hypercarbia, BNP 174, troponin 35, TSH 83 and free T4 less than 0.25 -Admitted, placed on BiPAP and diuretics, subsequently held with drop in blood pressure  Subjective: -Breathing better, off BiPAP yesterday afternoon, on 8 L O2  Assessment and Plan:  Acute on chronic diastolic CHF Right heart failure, cor pulmonale -Has some edema and pulmonary vascular congestion and small effusions noted on x-ray -Continue IV Lasix today, added midodrine yesterday -Now off BiPAP, needs nightly CPAP -Appreciate cardiology input, 3.6 L negative, attempt to wean O2, currently on 8 L -Follow-up repeat echo, last echo in 8/22 noted preserved EF, severe LVH and normal RV -PT OT eval -TOC consult for home CPAP  Severe OSA/OHS -Recent sleep study with severe OSA, reportedly has not been started on CPAP,  -Started here  Severe hypothyroidism -TSH 83 with free T4 of <0.25, not on Synthroid at baseline, started IV Synthroid given severity of hypothyroidism, likely amiodarone induced -Will need repeat labs in 6 weeks -Switch to oral Synthroid tomorrow  Paroxysmal atrial for flutter -Currently in sinus rhythm, amiodarone causing severe hypothyroidism, holding Amio, switch to apixaban today  OSA/OHS -Add CPAP nightly, TOC consult for home CPAP -Weight loss recommended  Acute metabolic encephalopathy -Likely  from hypoxia and hypercarbia -Improving, monitor  Chronic venous insufficiency  DVT prophylaxis: Hep>Eliquis Code Status: Full code Family Communication: None present Disposition Plan: Home likely 1 to 2 days  Consultants: Cards   Procedures:   Antimicrobials:    Objective: Vitals:   02/08/23 0354 02/08/23 0401 02/08/23 0756 02/08/23 0801  BP: (!) 104/55  101/66   Pulse: 61  (!) 56   Resp: 20  18   Temp: 97.6 F (36.4 C)  97.9 F (36.6 C)   TempSrc: Oral  Oral   SpO2: 97%  96% 97%  Weight:  134.1 kg    Height:        Intake/Output Summary (Last 24 hours) at 02/08/2023 1040 Last data filed at 02/08/2023 0800 Gross per 24 hour  Intake 227.86 ml  Output 2400 ml  Net -2172.14 ml   Filed Weights   02/06/23 2111 02/07/23 0413 02/08/23 0401  Weight: (!) 137.5 kg (!) 136.4 kg 134.1 kg    Examination:  General exam: Obese chronically ill male sitting up in bed, AAO x 2 HEENT: Neck obese unable to assess JVD CVS: S1-S2, regular rhythm Lungs: Decreased breath sounds in the bases Abdomen: Soft, nontender, bowel sounds present Extremities: 1+ MR, chronic skin changes and hyperpigmentation Psychiatry: Flat affect    Data Reviewed:   CBC: Recent Labs  Lab 02/06/23 1434 02/06/23 1505 02/06/23 1628 02/06/23 1954 02/07/23 0054 02/08/23 0105  WBC 4.5  --   --   --  5.3 5.3  NEUTROABS  --   --   --   --  3.1  --   HGB 16.0 18.4* 18.7* 18.7* 16.8 16.6  HCT 51.8 54.0* 55.0* 55.0* 52.2* 51.9  MCV 107.5*  --   --   --  106.3* 105.9*  PLT 142*  --   --   --  149* 147*   Basic Metabolic Panel: Recent Labs  Lab 02/06/23 1434 02/06/23 1505 02/06/23 1628 02/06/23 1954 02/06/23 2045 02/07/23 0600 02/08/23 0105  NA 132* 129* 127* 130*  --  133* 131*  K 4.6 4.7 4.2 4.2  --  5.5* 3.7  CL 80*  --   --   --   --  80* 82*  CO2 42*  --   --   --   --  37* 39*  GLUCOSE 90  --   --   --   --  65* 88  BUN 12  --   --   --   --  13 13  CREATININE 1.76*  --   --   --   --   1.48* 1.64*  CALCIUM 8.9  --   --   --   --  9.2 9.0  MG  --   --   --   --  2.1  --   --    GFR: Estimated Creatinine Clearance: 50.5 mL/min (A) (by C-G formula based on SCr of 1.64 mg/dL (H)). Liver Function Tests: Recent Labs  Lab 02/06/23 1434 02/07/23 0600  AST 41 53*  ALT 15 17  ALKPHOS 56 55  BILITOT 1.5* 2.0*  PROT 6.5 6.6  ALBUMIN 3.3* 3.6   No results for input(s): "LIPASE", "AMYLASE" in the last 168 hours. Recent Labs  Lab 02/06/23 1705  AMMONIA 21   Coagulation Profile: No results for input(s): "INR", "PROTIME" in the last 168 hours. Cardiac Enzymes: No results for input(s): "CKTOTAL", "CKMB", "CKMBINDEX", "TROPONINI" in the last 168 hours. BNP (last 3 results) No results for input(s): "PROBNP" in the last 8760 hours. HbA1C: No results for input(s): "HGBA1C" in the last 72 hours. CBG: Recent Labs  Lab 02/06/23 1505  GLUCAP 86   Lipid Profile: No results for input(s): "CHOL", "HDL", "LDLCALC", "TRIG", "CHOLHDL", "LDLDIRECT" in the last 72 hours. Thyroid Function Tests: Recent Labs    02/06/23 1705 02/06/23 2045  TSH 83.727*  --   FREET4  --  <0.25*   Anemia Panel: No results for input(s): "VITAMINB12", "FOLATE", "FERRITIN", "TIBC", "IRON", "RETICCTPCT" in the last 72 hours. Urine analysis:    Component Value Date/Time   COLORURINE YELLOW 09/24/2020 1354   APPEARANCEUR CLEAR 09/24/2020 1354   LABSPEC 1.014 09/24/2020 1354   PHURINE 5.0 09/24/2020 1354   GLUCOSEU NEGATIVE 09/24/2020 1354   HGBUR SMALL (A) 09/24/2020 1354   BILIRUBINUR NEGATIVE 09/24/2020 1354   KETONESUR NEGATIVE 09/24/2020 1354   PROTEINUR NEGATIVE 09/24/2020 1354   NITRITE NEGATIVE 09/24/2020 1354   LEUKOCYTESUR NEGATIVE 09/24/2020 1354   Sepsis Labs: @LABRCNTIP (procalcitonin:4,lacticidven:4)  )No results found for this or any previous visit (from the past 240 hour(s)).   Radiology Studies: CT Head Wo Contrast  Result Date: 02/06/2023 CLINICAL DATA:  Mental status  change, unknown cause EXAM: CT HEAD WITHOUT CONTRAST TECHNIQUE: Contiguous axial images were obtained from the base of the skull through the vertex without intravenous contrast. RADIATION DOSE REDUCTION: This exam was performed according to the departmental dose-optimization program which includes automated exposure control, adjustment of the mA and/or kV according to patient size and/or use of iterative reconstruction technique. COMPARISON:  Head CT 09/24/2020 FINDINGS: Brain: No intracranial hemorrhage, mass effect, or midline shift. Age related atrophy. No hydrocephalus. The basilar cisterns are patent. Moderate periventricular and  deep white matter hypodensity typical of chronic small vessel ischemic change. No evidence of territorial infarct or acute ischemia. No extra-axial or intracranial fluid collection. Vascular: Atherosclerosis of skullbase vasculature without hyperdense vessel or abnormal calcification. Skull: No fracture or focal lesion. Sinuses/Orbits: Bilateral cataract resection. Mucosal thickening throughout the left maxillary sinus is new from prior exam. Minimal opacification of lower left mastoid air cells. Other: None. IMPRESSION: 1. No acute intracranial abnormality. 2. 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 3. Left maxillary sinus mucosal thickening, new from prior exam. Recommend correlation for symptoms of sinusitis. Electronically Signed   By: Narda Rutherford M.D.   On: 02/06/2023 16:19   DG Chest 2 View  Result Date: 02/06/2023 CLINICAL DATA:  Shortness of breath EXAM: CHEST - 2 VIEW COMPARISON:  X-ray and CT angiogram 03/18/2021 FINDINGS: Under penetrated radiographs. Motion on lateral view as well. Enlarged cardiopericardial silhouette with vascular congestion and small effusions. No pneumothorax. No obvious consolidation but limited x-rays. Overlapping cardiac leads. IMPRESSION: Limited x-rays. Enlarged heart with vascular congestion and small effusions.  Recommend follow-up Electronically Signed   By: Karen Kays M.D.   On: 02/06/2023 15:11     Scheduled Meds:  furosemide  80 mg Intravenous BID   ipratropium-albuterol  3 mL Nebulization Q6H   levothyroxine  50 mcg Intravenous Daily   midodrine  10 mg Oral BID WC   sodium chloride flush  3 mL Intravenous Q12H   Continuous Infusions:  sodium chloride     heparin 1,100 Units/hr (02/08/23 0800)     LOS: 2 days    Time spent:    Zannie Cove, MD [facility]   02/08/2023, 10:40 AM

## 2023-02-08 NOTE — Progress Notes (Signed)
Rounding Note    Patient Name: Shane Diaz Date of Encounter: 02/08/2023  [facility] Cardiologist: Thurmon Fair, MD   Subjective   No CP or dyspnea  Inpatient Medications    Scheduled Meds:  furosemide  80 mg Intravenous TID   ipratropium-albuterol  3 mL Nebulization Q6H   levothyroxine  50 mcg Intravenous Daily   midodrine  10 mg Oral BID WC   sodium chloride flush  3 mL Intravenous Q12H   Continuous Infusions:  sodium chloride     heparin 1,100 Units/hr (02/08/23 0221)   PRN Meds: sodium chloride, acetaminophen **OR** acetaminophen, albuterol, hydrALAZINE, ondansetron **OR** ondansetron (ZOFRAN) IV, sodium chloride flush   Vital Signs    Vitals:   02/08/23 0354 02/08/23 0401 02/08/23 0756 02/08/23 0801  BP: (!) 104/55  101/66   Pulse: 61  (!) 56   Resp: 20  18   Temp: 97.6 F (36.4 C)  97.9 F (36.6 C)   TempSrc: Oral  Oral   SpO2: 97%  96% 97%  Weight:  134.1 kg    Height:        Intake/Output Summary (Last 24 hours) at 02/08/2023 1003 Last data filed at 02/08/2023 0358 Gross per 24 hour  Intake 5 ml  Output 1900 ml  Net -1895 ml      02/08/2023    4:01 AM 02/07/2023    4:13 AM 02/06/2023    9:11 PM  Last 3 Weights  Weight (lbs) 295 lb 9.6 oz 300 lb 11.2 oz 303 lb 1.6 oz  Weight (kg) 134.083 kg 136.397 kg 137.485 kg      Telemetry    Sinus with nonconducted pac- Personally Reviewed   Physical Exam   GEN: No acute distress.  Obese Neck: No JVD Cardiac: RRR Respiratory: Clear to auscultation bilaterally. GI: Soft, nontender, non-distended  MS: trace edema; chronic skin changes Neuro:  Nonfocal  Psych: Normal affect   Labs    High Sensitivity Troponin:   Recent Labs  Lab 02/06/23 2045 02/06/23 2206  TROPONINIHS 35* 28*     Chemistry Recent Labs  Lab 02/06/23 1434 02/06/23 1505 02/06/23 1954 02/06/23 2045 02/07/23 0600 02/08/23 0105  NA 132*   < > 130*  --  133* 131*  K 4.6   < > 4.2  --  5.5* 3.7  CL 80*  --   --    --  80* 82*  CO2 42*  --   --   --  37* 39*  GLUCOSE 90  --   --   --  65* 88  BUN 12  --   --   --  13 13  CREATININE 1.76*  --   --   --  1.48* 1.64*  CALCIUM 8.9  --   --   --  9.2 9.0  MG  --   --   --  2.1  --   --   PROT 6.5  --   --   --  6.6  --   ALBUMIN 3.3*  --   --   --  3.6  --   AST 41  --   --   --  53*  --   ALT 15  --   --   --  17  --   ALKPHOS 56  --   --   --  55  --   BILITOT 1.5*  --   --   --  2.0*  --  GFRNONAA 39*  --   --   --  48* 43*  ANIONGAP 10  --   --   --  16* 10   < > = values in this interval not displayed.     Hematology Recent Labs  Lab 02/06/23 1434 02/06/23 1505 02/06/23 1954 02/07/23 0054 02/08/23 0105  WBC 4.5  --   --  5.3 5.3  RBC 4.82  --   --  4.91 4.90  HGB 16.0   < > 18.7* 16.8 16.6  HCT 51.8   < > 55.0* 52.2* 51.9  MCV 107.5*  --   --  106.3* 105.9*  MCH 33.2  --   --  34.2* 33.9  MCHC 30.9  --   --  32.2 32.0  RDW 15.7*  --   --  15.9* 15.9*  PLT 142*  --   --  149* 147*   < > = values in this interval not displayed.   Thyroid  Recent Labs  Lab 02/06/23 1705 02/06/23 2045  TSH 83.727*  --   FREET4  --  <0.25*    BNP Recent Labs  Lab 02/06/23 1705  BNP 174.1*    DDimer  Recent Labs  Lab 02/06/23 2045  DDIMER 0.63*     Radiology    CT Head Wo Contrast  Result Date: 02/06/2023 CLINICAL DATA:  Mental status change, unknown cause EXAM: CT HEAD WITHOUT CONTRAST TECHNIQUE: Contiguous axial images were obtained from the base of the skull through the vertex without intravenous contrast. RADIATION DOSE REDUCTION: This exam was performed according to the departmental dose-optimization program which includes automated exposure control, adjustment of the mA and/or kV according to patient size and/or use of iterative reconstruction technique. COMPARISON:  Head CT 09/24/2020 FINDINGS: Brain: No intracranial hemorrhage, mass effect, or midline shift. Age related atrophy. No hydrocephalus. The basilar cisterns are patent.  Moderate periventricular and deep white matter hypodensity typical of chronic small vessel ischemic change. No evidence of territorial infarct or acute ischemia. No extra-axial or intracranial fluid collection. Vascular: Atherosclerosis of skullbase vasculature without hyperdense vessel or abnormal calcification. Skull: No fracture or focal lesion. Sinuses/Orbits: Bilateral cataract resection. Mucosal thickening throughout the left maxillary sinus is new from prior exam. Minimal opacification of lower left mastoid air cells. Other: None. IMPRESSION: 1. No acute intracranial abnormality. 2. 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 3. Left maxillary sinus mucosal thickening, new from prior exam. Recommend correlation for symptoms of sinusitis. Electronically Signed   By: Narda Rutherford M.D.   On: 02/06/2023 16:19   DG Chest 2 View  Result Date: 02/06/2023 CLINICAL DATA:  Shortness of breath EXAM: CHEST - 2 VIEW COMPARISON:  X-ray and CT angiogram 03/18/2021 FINDINGS: Under penetrated radiographs. Motion on lateral view as well. Enlarged cardiopericardial silhouette with vascular congestion and small effusions. No pneumothorax. No obvious consolidation but limited x-rays. Overlapping cardiac leads. IMPRESSION: Limited x-rays. Enlarged heart with vascular congestion and small effusions. Recommend follow-up Electronically Signed   By: Karen Kays M.D.   On: 02/06/2023 15:11      Patient Profile     77 year old male with past medical history of severe obstructive sleep apnea, right heart failure felt secondary to obesity hypoventilation/OSA, paroxysmal atrial fibrillation/flutter, hypertension for evaluation of acute on chronic right-sided heart failure.  Patient has chronic dyspnea on exertion.  He states that is unchanged.  He becomes dyspneic with walking 10 feet.  He denies orthopnea or PND.  He has chronic pedal  edema but states this is actually improved compared to previous.  He has  not had chest pain, palpitations or syncope.  He complains predominantly of lack of energy and fatigue.  He was admitted and cardiology asked to evaluate.  Chest x-ray shows vascular congestion and small pleural effusions.  Creatinine 1.48, BUN 13, BNP 174, troponin 35 and 28, hemoglobin 16.8, TSH 83.727, free T4 less than 0.25.  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   Assessment & Plan    1 acute on chronic diastolic/right heart failure-appears to be euvolemic; change lasix to 80 mg IV BID. Transition to home dose of bumex in AM.    2 hypothyroidism-TSH is markedly elevated and free T4 less than 0.25.  This is likely the cause of his generalized weakness and fatigue.  Continue levothyroxine.   3 paroxysmal atrial fibrillation/flutter-he is in sinus rhythm.  I am concerned that amiodarone is likely causing his hypothyroidism.  Note TSH July 02, 2022 7.820.  Free T4 was normal.  I will hold amiodarone for now.  CHA2DS2-VASc 4.  Patient is presently on IV heparin.  Will transition back to apixaban when able.   4 obstructive sleep apnea-needs to be compliant with CPAP at home.   5 obesity hypoventilation syndrome-needs weight loss.  For questions or updates, please contact [facility] Please consult [email or web address] for contact info under        Signed, Olga Millers, MD  02/08/2023, 10:03 AM

## 2023-02-08 NOTE — Progress Notes (Signed)
ANTICOAGULATION CONSULT NOTE - Follow Up Consult  Pharmacy Consult for Heparin infusion Indication: atrial fibrillation  Allergies  Allergen Reactions   Diltiazem     rash   Amiodarone Rash    Patient Measurements: Height: 5\' 8"  (172.7 cm) Weight: 134.1 kg (295 lb 9.6 oz) IBW/kg (Calculated) : 68.4 Heparin Dosing Weight: 102.5 kg  Vital Signs: Temp: 97.9 F (36.6 C) (07/07 0756) Temp Source: Oral (07/07 0756) BP: 104/60 (07/07 1050) Pulse Rate: 52 (07/07 1050)  Labs: Recent Labs     0000 02/06/23 1434 02/06/23 1505 02/06/23 1954 02/06/23 2045 02/06/23 2137 02/06/23 2206 02/07/23 0054 02/07/23 0600 02/07/23 0932 02/07/23 2024 02/08/23 0105 02/08/23 1015  HGB  --  16.0   < > 18.7*  --   --   --  16.8  --   --   --  16.6  --   HCT  --  51.8   < > 55.0*  --   --   --  52.2*  --   --   --  51.9  --   PLT  --  142*  --   --   --   --   --  149*  --   --   --  147*  --   APTT   < >  --   --   --  35  --   --   --  >200* 172* 93* 115* 111*  HEPARINUNFRC  --   --   --   --   --    < >  --   --  >1.10* >1.10*  --  >1.10*  --   CREATININE  --  1.76*  --   --   --   --   --   --  1.48*  --   --  1.64*  --   TROPONINIHS  --   --   --   --  35*  --  28*  --   --   --   --   --   --    < > = values in this interval not displayed.     Estimated Creatinine Clearance: 50.5 mL/min (A) (by C-G formula based on SCr of 1.64 mg/dL (H)).   Assessment: 77 yo M presents with AMS, visual hallucinations x 2 weeks. Patient has a history of Afib and takes apixaban 5mg  po BID at home and last dose was taken on 7/4 at 18:00. Pharmacy consulted to dose heparin infusion for Afib.  Heparin drip 1500 uts/hr with heparin level >1.1 as expected - falsely elevated from apixaban  Aptt 175 sec - drawn from opposite arm as heparin infusion - greater than goal  Patient off Bipap so planned to resume apixaban but sats dropped and back on Bipap - no apixaban given yet in hospital.  aPTT 111 sec this  am at top of goal range on heparin infusion at 1100 units/hr. No bleeding noted. Plan to continue this afternoon then change back to pta apixaban   Goal of Therapy:  Heparin level 0.3-0.7 units/ml; aPTT 66-102 sec Monitor platelets by anticoagulation protocol: Yes   Plan:  Continue heparin 1100 uts/hr until tests complete  Once back in room and able to take po meds this evening stop heparin and resume apixaban 5mg  BID   Leota Sauers Pharm.D. CPP, BCPS Clinical Pharmacist [phone] 02/08/2023 12:19 PM   Please see amion for complete clinical pharmacist phone list 02/08/2023 12:17 PM

## 2023-02-08 NOTE — Progress Notes (Signed)
Inpatient Rehab Admissions Coordinator:   Per therapy recommendations, patient was screened for CIR candidacy by Jwan Hornbaker, MS, CCC-SLP. At this time, Pt. does not appear to demonstrate medical necessity to justify in hospital rehabilitation/CIR. will not pursue a rehab consult for this Pt.   Recommend other rehab venues to be pursued.  Please contact me with any questions.  Madasyn Heath, MS, CCC-SLP Rehab Admissions Coordinator  [phone] (celll) [phone] (office)   

## 2023-02-08 NOTE — Evaluation (Signed)
Occupational Therapy Evaluation Patient Details Name: Shane Diaz MRN: [id number] DOB: [birth year] Today's Date: 02/08/2023   History of Present Illness The pt is a 77 yo male presenting 7/5 with AMS, visual hallucinations, generalized weakness. Found to have acute CHF exacerbation, also requiring BiPAP due to hypercapnia and hypoxia. PMH includes: morbid obesity, HTN, afib s/p cardioversion x2 in 2021, atrial flutter with AV block, obesity hypoventilation syndrome on 2L O2, OSA, chronic hypoxic resp failure, venous insufficiency, DVT.   Clinical Impression     Pt in bed upon arrival of OT and PT, agreeable to evaluation at this time. The pt reports he was independent, driving, and mobilizing in the community until 2 weeks ago, after his fall his mobility has declined and he has been limited to short distance mobility in the home with significant SOB and need for assist from his wife. The pt required modA of 1-2 to complete bed mobility and initial sit-stand transfers this session, but was unable to progress to gait training due to fatigue in LE after 15-30 sec static stance. The pt now presents with limitations in ADL activity, functional mobility,  activity tolerance, and stability due to above dx, and will continue to benefit from skilled OT to address these deficits. VSS on 7L O2 for session. He is highly motivated to return to independence, will benefit from short stint of intensive therapies to allow for full return to independence with little burden on his wife to physically assist.           Recommendations for follow up therapy are one component of a multi-disciplinary discharge planning process, led by the attending physician.  Recommendations may be updated based on patient status, additional functional criteria and insurance authorization.   Assistance Recommended at Discharge Intermittent Supervision/Assistance  Patient can return home with the following A little help with walking  and/or transfers;A little help with bathing/dressing/bathroom    Functional Status Assessment  Patient has had a recent decline in their functional status and demonstrates the ability to make significant improvements in function in a reasonable and predictable amount of time.  Equipment Recommendations  BSC/3in1    Recommendations for Other Services Rehab consult     Precautions / Restrictions Precautions Precautions: Fall Precaution Comments: watch O2, on 7L this session Restrictions Weight Bearing Restrictions: No      Mobility Bed Mobility Overal bed mobility: Needs Assistance Bed Mobility: Supine to Sit, Sit to Supine     Supine to sit: Mod assist, HOB elevated Sit to supine: Mod assist, +2 for physical assistance   General bed mobility comments: pt able to initiate movement to EOB with modA, completed scooting with some assist with pad. modA of 2 to manage return to supine and repositioning. pt with poor body awareness and unable to reposition without significant cues    Transfers Overall transfer level: Needs assistance Equipment used: 2 person hand held assist Transfers: Sit to/from Stand Sit to Stand: Mod assist, +2 physical assistance           General transfer comment: moda to rise and steady, maintained standing for 15-30 seconds. no buckling in stance. VSS but pt reports fatigued      Balance Overall balance assessment: Needs assistance Sitting-balance support: No upper extremity supported, Feet supported Sitting balance-Leahy Scale: Fair     Standing balance support: Bilateral upper extremity supported, During functional activity Standing balance-Leahy Scale: Poor Standing balance comment: dependent on UE support and external support  ADL either performed or assessed with clinical judgement   ADL Overall ADL's : Needs assistance/impaired Eating/Feeding: Minimal assistance;Sitting   Grooming: Minimal  assistance;Sitting   Upper Body Bathing: Moderate assistance;Standing   Lower Body Bathing: Maximal assistance;+2 for safety/equipment;Sitting/lateral leans;Sit to/from stand   Upper Body Dressing : Moderate assistance;Sitting   Lower Body Dressing: Maximal assistance;+2 for safety/equipment;Sit to/from stand;Sitting/lateral leans                 General ADL Comments: pt currently needs significant A with ADL activity and will benefit from rehab prior to DC home with wife     Vision   Vision Assessment?: No apparent visual deficits            Pertinent Vitals/Pain Pain Assessment Faces Pain Scale: Hurts a little bit Pain Location: generalized grimace with movement Pain Descriptors / Indicators: Grimacing        Extremity/Trunk Assessment Upper Extremity Assessment Upper Extremity Assessment: Generalized weakness   Lower Extremity Assessment Lower Extremity Assessment: RLE deficits/detail;LLE deficits/detail RLE Deficits / Details: chronic LE wounds and edema, red/purple in color below knee. pt with good anti-gravity movements and power for standing RLE Sensation: WNL RLE Coordination: WNL LLE Deficits / Details: chronic LE wounds and edema, red/purple in color below knee. pt with good anti-gravity movements and power for standing LLE Sensation: WNL LLE Coordination: WNL   Cervical / Trunk Assessment Cervical / Trunk Assessment: Other exceptions Cervical / Trunk Exceptions: large body habitus   Communication Communication Communication: No difficulties   Cognition Arousal/Alertness: Awake/alert Behavior During Therapy: WFL for tasks assessed/performed Overall Cognitive Status: No family/caregiver present to determine baseline cognitive functioning                                 General Comments: pt able to follow all instructions given in session, express concerns regarding safety, and express needs. answering questions appropriately. not  formally assessed     General Comments  SpO2 stable on 7L, BP stable despite pt reports of dizziness with changes in position            Home Living Family/patient expects to be discharged to:: Private residence Living Arrangements: Spouse/significant other Available Help at Discharge: Family;Available 24 hours/day Type of Home: House Home Access: Stairs to enter [employer] of Steps: 3 Entrance Stairs-Rails: Right;Left;Can reach both Home Layout: One level     Bathroom Shower/Tub: Chief Strategy Officer: Handicapped height Bathroom Accessibility: Yes   Home Equipment: Agricultural consultant (2 wheels)          Prior Functioning/Environment Prior Level of Function : Independent/Modified Independent             Mobility Comments: pt uses RW to walk short distances, reports 2 falls this year ADLs Comments: pt reports sponge bath at baseline, wanting to renovate bathroom.        OT Problem List: Decreased strength;Decreased activity tolerance;Impaired balance (sitting and/or standing);Decreased safety awareness;Decreased knowledge of use of DME or AE      OT Treatment/Interventions: Self-care/ADL training;Patient/family education;Therapeutic activities    OT Goals(Current goals can be found in the care plan section) Acute Rehab OT Goals Patient Stated Goal: get stronger OT Goal Formulation: With patient Time For Goal Achievement: 02/22/23 Potential to Achieve Goals: Good  OT Frequency: Min 2X/week    Co-evaluation PT/OT/SLP Co-Evaluation/Treatment: Yes Reason for Co-Treatment: Necessary to address cognition/behavior during functional activity;For patient/therapist safety;To address functional/ADL  transfers PT goals addressed during session: Mobility/safety with mobility;Balance;Strengthening/ROM        AM-PAC OT "6 Clicks" Daily Activity     Outcome Measure Help from another person eating meals?: A Little Help from another person taking  care of personal grooming?: A Little Help from another person toileting, which includes using toliet, bedpan, or urinal?: A Lot Help from another person bathing (including washing, rinsing, drying)?: A Lot Help from another person to put on and taking off regular upper body clothing?: A Lot Help from another person to put on and taking off regular lower body clothing?: A Lot 6 Click Score: 14   End of Session Equipment Utilized During Treatment: Gait belt;Rolling walker (2 wheels) Nurse Communication: Mobility status  Activity Tolerance: Patient limited by fatigue Patient left: in bed;with call bell/phone within reach  OT Visit Diagnosis: Unsteadiness on feet (R26.81);Other abnormalities of gait and mobility (R26.89);History of falling (Z91.81);Muscle weakness (generalized) (M62.81)                Time: 1340-1410 OT Time Calculation (min): 30 min Charges:  OT General Charges $OT Visit: 1 Visit OT Evaluation $OT Eval Moderate Complexity: 1 Mod  Lise Auer, OT Acute Rehabilitation Services  [phone], Karin Golden D 02/08/2023, 3:22 PM

## 2023-02-08 NOTE — Progress Notes (Signed)
OT Cancellation Note  Patient Details Name: Shane Diaz MRN: [id number] DOB: Nov 07, 1945   Cancelled Treatment:    Reason Eval/Treat Not Completed: Other (comment) Pt having echo at time OT checked on pt- will check on pt later in day as schedule allows or next day Lise Auer, OT Acute Rehabilitation Services  [phone], [place] Kung 02/08/2023, 12:18 PM

## 2023-02-08 NOTE — Progress Notes (Signed)
ANTICOAGULATION CONSULT NOTE - Follow Up Consult  Pharmacy Consult for heparin Indication: atrial fibrillation  Labs: Recent Labs     0000 02/06/23 1434 02/06/23 1505 02/06/23 1954 02/06/23 2045 02/06/23 2137 02/06/23 2206 02/07/23 0054 02/07/23 0600 02/07/23 0932 02/07/23 2024 02/08/23 0105  HGB  --  16.0   < > 18.7*  --   --   --  16.8  --   --   --  16.6  HCT  --  51.8   < > 55.0*  --   --   --  52.2*  --   --   --  51.9  PLT  --  142*  --   --   --   --   --  149*  --   --   --  147*  APTT   < >  --   --   --  35  --   --   --  >200* 172* 93* 115*  HEPARINUNFRC  --   --   --   --   --    < >  --   --  >1.10* >1.10*  --  >1.10*  CREATININE  --  1.76*  --   --   --   --   --   --  1.48*  --   --  1.64*  TROPONINIHS  --   --   --   --  35*  --  28*  --   --   --   --   --    < > = values in this interval not displayed.    Assessment: 77yo male supratherapeutic on heparin after one PTT at goal; no infusion issues or signs of bleeding per RN though she notes that earlier in the shift there was a small wound that was oozing, no longer actively bleeding.  Goal of Therapy:  aPTT 66-102 seconds   Plan:  Decrease heparin infusion by 1-2 units/kg/hr to 1100 units/hr. Check PTT in 8 hours.   Vernard Gambles, PharmD, BCPS 02/08/2023 2:21 AM

## 2023-02-09 ENCOUNTER — Encounter (HOSPITAL_COMMUNITY): Payer: Self-pay | Admitting: Internal Medicine

## 2023-02-09 DIAGNOSIS — J9601 Acute respiratory failure with hypoxia: Secondary | ICD-10-CM | POA: Diagnosis not present

## 2023-02-09 DIAGNOSIS — J9612 Chronic respiratory failure with hypercapnia: Secondary | ICD-10-CM | POA: Diagnosis not present

## 2023-02-09 DIAGNOSIS — I50813 Acute on chronic right heart failure: Secondary | ICD-10-CM

## 2023-02-09 DIAGNOSIS — I480 Paroxysmal atrial fibrillation: Secondary | ICD-10-CM

## 2023-02-09 LAB — BASIC METABOLIC PANEL
Anion gap: 15 (ref 5–15)
BUN: 15 mg/dL (ref 8–23)
CO2: 36 mmol/L — ABNORMAL HIGH (ref 22–32)
Calcium: 9.3 mg/dL (ref 8.9–10.3)
Chloride: 81 mmol/L — ABNORMAL LOW (ref 98–111)
Creatinine, Ser: 1.95 mg/dL — ABNORMAL HIGH (ref 0.61–1.24)
GFR, Estimated: 35 mL/min — ABNORMAL LOW (ref >60)
Glucose, Bld: 134 mg/dL — ABNORMAL HIGH (ref 70–99)
Potassium: 3.9 mmol/L (ref 3.5–5.1)
Sodium: 132 mmol/L — ABNORMAL LOW (ref 135–145)

## 2023-02-09 LAB — CBC
HCT: 51.3 % (ref 39.0–52.0)
Hemoglobin: 16.3 g/dL (ref 13.0–17.0)
MCH: 33.6 pg (ref 26.0–34.0)
MCHC: 31.8 g/dL (ref 30.0–36.0)
MCV: 105.8 fL — ABNORMAL HIGH (ref 80.0–100.0)
Platelets: 150 10*3/uL (ref 150–400)
RBC: 4.85 MIL/uL (ref 4.22–5.81)
RDW: 16 % — ABNORMAL HIGH (ref 11.5–15.5)
WBC: 5.9 10*3/uL (ref 4.0–10.5)
nRBC: 0 % (ref 0.0–0.2)

## 2023-02-09 LAB — GLUCOSE, CAPILLARY: Glucose-Capillary: 89 mg/dL (ref 70–99)

## 2023-02-09 MED ORDER — LEVOTHYROXINE SODIUM 100 MCG PO TABS
100.0000 ug | ORAL_TABLET | Freq: Every day | ORAL | Status: DC
  Administered 2023-02-10 – 2023-02-12 (×3): 100 ug via ORAL
  Filled 2023-02-09 (×3): qty 1

## 2023-02-09 MED ORDER — BUMETANIDE 2 MG PO TABS
2.0000 mg | ORAL_TABLET | Freq: Two times a day (BID) | ORAL | Status: DC
  Administered 2023-02-10 – 2023-02-12 (×5): 2 mg via ORAL
  Filled 2023-02-09 (×6): qty 1

## 2023-02-09 NOTE — Progress Notes (Signed)
Heart Failure Nurse Navigator Progress Note  PCP: Ralene Ok, MD PCP-Cardiologist: [name] Admission Diagnosis: Acute on chronic respiratory failure with hypoxia.  Admitted from: Home via EMS  Presentation:   Shane Diaz presented with visual hallucinations, per wife been like this for 2 weeks, generalized weakness, difficulty walking, shortness of breath. Dietary non compliance at home, and not wearing CPAP at night. CXR with cardiomegaly, vascular congestion, and small effusion. CTA negative for PE.   Patient was educated on sign and symptoms of heart failure, daily weights, when to call his doctor or go to the ED. Diet/ fluid restrictions, taking all medications as prescribed and attending all medical appointments. A HF TOC appointment was scheduled per Dr. Jomarie Longs for 02/20/2023 @ 12 noon.   ECHO/ LVEF: 65-70% G2DD  Clinical Course:  Past Medical History:  Diagnosis Date   Acute diastolic heart failure (HCC) 08/03/2020   Arthritis    Diverticulosis 05/2015   Mild, noted on colonosocpy   Fatty liver 05/14/2015   noted on [place] ABD   History of colon polyps 05/2014   sessile in ascending colon, pedunculated in sigmoid colon, diverticulosis   Hyperlipidemia    pt denies   Hypertension    not currently taking medications per MD   OSA (obstructive sleep apnea) 08/03/2020   Renal cyst 05/2014   Small, Left, noted on [place] ABD   Umbilical hernia    Varicose veins      Social History   Socioeconomic History   Marital status: Married    Spouse name: Not on file   Number of children: 0   Years of education: Not on file   Highest education level: Not on file  Occupational History   Not on file  Tobacco Use   Smoking status: Never   Smokeless tobacco: Never  Vaping Use   Vaping Use: Never used  Substance and Sexual Activity   Alcohol use: Yes    Alcohol/week: 0.0 standard drinks of alcohol    Comment: occassional wine, martini   Drug use: No   Sexual activity: Not on  file  Other Topics Concern   Not on file  Social History Narrative   Not on file   Social Determinants of Health   Financial Resource Strain: Not on file  Food Insecurity: No Food Insecurity (02/07/2023)   Hunger Vital Sign    Worried About Running Out of Food in the Last Year: Never true    Ran Out of Food in the Last Year: Never true  Transportation Needs: No Transportation Needs (02/07/2023)   PRAPARE - Administrator, Civil Service (Medical): No    Lack of Transportation (Non-Medical): No  Physical Activity: Not on file  Stress: Not on file  Social Connections: Not on file   Education Assessment and Provision:  Detailed education and instructions provided on heart failure disease management including the following:  Signs and symptoms of Heart Failure When to call the physician Importance of daily weights Low sodium diet Fluid restriction Medication management Anticipated future follow-up appointments  Patient education given on each of the above topics.  Patient acknowledges understanding via teach back method and acceptance of all instructions.  Education Materials:  "Living Better With Heart Failure" Booklet, HF zone tool, & Daily Weight Tracker Tool.  Patient has scale at home: Yes Patient has pill box at home: No    High Risk Criteria for Readmission and/or Poor Patient Outcomes: Heart failure hospital admissions (last 6 months): 0  No Show  rate: 7 % Difficult social situation: No Demonstrates medication adherence: Yes Primary Language: Romanian/ does speak English too Literacy level: Reading, writing  Barriers of Care:   Language Diet/ fluid restrictions Daily weights  Considerations/Referrals:   Referral made to Heart Failure Pharmacist Stewardship: Yes Referral made to Heart Failure CSW/NCM TOC: No Referral made to Heart & Vascular TOC clinic: Yes, per Dr. Jomarie Longs 02/20/2023 @ 12 noon.   Items for Follow-up on DC/TOC: Diet/ fluid  restrictions daily weights Continued HF education    Rhae Hammock, BSN, RN Heart Failure Print production planner Chat Only

## 2023-02-09 NOTE — Care Management Important Message (Signed)
Important Message  Patient Details  Name: Shane Diaz MRN: [id number] Date of Birth: 12-03-1945   Medicare Important Message Given:  Yes     Renie Ora 02/09/2023, 8:19 AM

## 2023-02-09 NOTE — Progress Notes (Addendum)
Paged by nurse regarding bradycardia, on telemetry, it appears patient had brief 2:1 AV block with HR 30s. Now HR is normalized with normal BP. Note amiodarone stopped. Not on any AV nodal blocking agent. Untreated OSA and cor pulmonale likely contributing to bradycardia.   Spoke with the patient who complained of freezing and dizziness. However the timing of his dizzy spell has been going on for some time and does not correlate with the onset of 2:1 AV block.  Discuss with Dr. Rennis Golden. Likely won't be a good pacer candidate  Ramond Dial PA Pager: [phone]

## 2023-02-09 NOTE — Consult Note (Signed)
WOC Nurse Consult Note: patient with history of venous insufficiency and venous ulcers as noted in cardiology notes; patient states he is not followed by anyone for these ulcers; there is some mention of previously wearing compression hose; history of chronic R leg DVT as well  Reason for Consult: bilateral lower extremity wounds  Wound type: venous  Pressure Injury POA: NA  Measurement: 1.  Full thickness L medial lower leg 2 cm x 1 cm x 0.1 cm 80% pink and moist 20% yellow  2.  Full thickness R anterior lower leg 2 cm x 3 cm area of soft hyperkeratotic tissue likely venous related   Drainage (amount, consistency, odor) minimal serosanguinous to L medial lower leg; R leg dry  Periwound: legs edematous, dark dusky purple red in color  Dressing procedure/placement/frequency:   L lower leg cover with foam to protect area. Lift foam daily to assess area. Change foam dressing q3 days and prn soiling.  R anterior lower leg wound clean with NS, apply small piece of Silver Hydrofiber [place] [phone]) to wound bed daily and cover with silicone foam. May lift foam daily to replace Silver.  MOISTEN SILVER WITH NS IF STUCK TO WOUND BED AT REMOVAL.  Change foam dressing q3 days and prn soiling.   Patient would benefit from compression stockings to lower legs if primary MD and patient are agreeable.  Legs should be elevated as much as possible.    POC discussed with patient and bedside nurse. WOC team will not follow at this time. Re-consult if further needs arise.   Thank you,    Priscella Mann MSN, RN-BC, [employer] [phone]

## 2023-02-09 NOTE — Progress Notes (Signed)
Physical Therapy Treatment Patient Details Name: Shane Diaz MRN: [id number] DOB: 01/30/1946 Today's Date: 02/09/2023   History of Present Illness The pt is a 77 yo male presenting 7/5 with AMS, visual hallucinations, generalized weakness. Found to have acute CHF exacerbation, also requiring BiPAP due to hypercapnia and hypoxia. PMH includes: morbid obesity, HTN, afib s/p cardioversion x2 in 2021, atrial flutter with AV block, obesity hypoventilation syndrome on 2L O2, OSA, chronic hypoxic resp failure, venous insufficiency, DVT.    PT Comments  Pt resting in bed upon arrival to room, states he cannot progress OOB given he is too weak and may need "one more day" before getting up. Pt agreeable to EOB, and once EOB pt requesting OOB to toilet. Pt mobilizing to and from bathroom with light steadying and RW assist, pt letting go of RW multiple times and reaching for environment to self-steady. Pt requires max safety cues throughout mobility, and is limited during session by stool incontinence on the way to the bathroom. VSS on 7LO2. PT updated plan to recommend lower-intensity rehabilitation post-acutely.     Assistance Recommended at Discharge Frequent or constant Supervision/Assistance  If plan is discharge home, recommend the following:  Can travel by private vehicle    A lot of help with walking and/or transfers;A lot of help with bathing/dressing/bathroom   Yes  Equipment Recommendations  Other (comment) (defer to next venue)    Recommendations for Other Services Rehab consult     Precautions / Restrictions Precautions Precautions: Fall Precaution Comments: watch O2, 7LO2 Restrictions Weight Bearing Restrictions: No     Mobility  Bed Mobility Overal bed mobility: Needs Assistance Bed Mobility: Supine to Sit     Supine to sit: Min assist, HOB elevated     General bed mobility comments: assist for trunk elevation via HHA, use of bedrails and HOB elevation     Transfers Overall transfer level: Needs assistance Equipment used: Rolling walker (2 wheels) Transfers: Sit to/from Stand Sit to Stand: Min assist           General transfer comment: assist for rise and steady, cues for hand placement. stand x2, from EOB and toilet.    Ambulation/Gait Ambulation/Gait assistance: Min assist Gait Distance (Feet): 15 Feet Assistive device: Rolling walker (2 wheels) Gait Pattern/deviations: Step-through pattern, Decreased stride length, Drifts right/left, Trunk flexed Gait velocity: decr     General Gait Details: assist to steady and guide RW, cues for upright posture, holding onto RW as pt frequently letting go   Optometrist     Tilt Bed    Modified Rankin (Stroke Patients Only)       Balance Overall balance assessment: Needs assistance Sitting-balance support: No upper extremity supported, Feet supported Sitting balance-Leahy Scale: Fair     Standing balance support: Bilateral upper extremity supported, During functional activity Standing balance-Leahy Scale: Poor Standing balance comment: dependent on UE support and external support                            Cognition Arousal/Alertness: Awake/alert Behavior During Therapy: WFL for tasks assessed/performed Overall Cognitive Status: No family/caregiver present to determine baseline cognitive functioning                                 General Comments: stool incontinence during session, follows commands with increased  time        Exercises General Exercises - Lower Extremity Long Arc Quad: AROM, Both, 5 reps, Seated    General Comments        Pertinent Vitals/Pain Pain Assessment Pain Assessment: Faces Faces Pain Scale: Hurts little more Pain Location: generalized Pain Descriptors / Indicators: Discomfort Pain Intervention(s): Limited activity within patient's tolerance, Monitored during session,  Repositioned    Home Living                          Prior Function            PT Goals (current goals can now be found in the care plan section) Acute Rehab PT Goals Patient Stated Goal: return to independence PT Goal Formulation: With patient Time For Goal Achievement: 02/22/23 Potential to Achieve Goals: Good Progress towards PT goals: Progressing toward goals    Frequency    Min 1X/week      PT Plan Discharge plan needs to be updated    Co-evaluation              AM-PAC PT "6 Clicks" Mobility   Outcome Measure  Help needed turning from your back to your side while in a flat bed without using bedrails?: A Little Help needed moving from lying on your back to sitting on the side of a flat bed without using bedrails?: A Lot Help needed moving to and from a bed to a chair (including a wheelchair)?: A Lot Help needed standing up from a chair using your arms (e.g., wheelchair or bedside chair)?: A Lot Help needed to walk in hospital room?: A Lot Help needed climbing 3-5 steps with a railing? : Total 6 Click Score: 12    End of Session Equipment Utilized During Treatment: Gait belt;Oxygen Activity Tolerance: Patient tolerated treatment well Patient left: in bed;with call bell/phone within reach;with bed alarm set Nurse Communication: Mobility status PT Visit Diagnosis: Unsteadiness on feet (R26.81);Other abnormalities of gait and mobility (R26.89);Muscle weakness (generalized) (M62.81)     Time: 1610-9604 PT Time Calculation (min) (ACUTE ONLY): 28 min  Charges:    $Therapeutic Activity: 8-22 mins $Self Care/Home Management: 8-22 PT General Charges $$ ACUTE PT VISIT: 1 Visit                     Marye Round, PT DPT Acute Rehabilitation Services Secure Chat Preferred  Office [phone]    Anneliese Leblond Sheliah Plane 02/09/2023, 12:28 PM

## 2023-02-09 NOTE — Progress Notes (Signed)
PROGRESS NOTE    Shane Diaz  ZOX:[id number] DOB: Feb 20, 1946 DOA: 02/06/2023 PCP: Ralene Ok, MD  77/M with history of severe OSA, right heart failure, chronic venous insufficiency, cor pulmonale, untreated OSA OHS, chronic respiratory failure on home O2 recurrent DVT, persistent A-fib presented to the ED with fatigue, dyspnea on exertion and fall. -Not very compliant with diuretics, has not been started on CPAP for severe sleep apnea -In the ED noted to have 1+ edema, hypoxic placed on 5 L O2, chest x-ray noted cardiomegaly, pulmonary vascular congestion and small pleural effusion, ABG noted hypercarbia, BNP 174, troponin 35, TSH 83 and free T4 less than 0.25 -Admitted, placed on BiPAP and diuretics, subsequently held with drop in blood pressure  Subjective: -Breathing better, down to 6-7 L O2, tolerated BiPAP last night  Assessment and Plan:  Acute on chronic diastolic CHF Right heart failure, cor pulmonale -Has some edema and pulmonary vascular congestion and small effusions noted on x-ray -Diuresed with IV Lasix, 4.2 L negative, weight down 8 LB -Now off BiPAP, needs nightly CPAP, wean O2 as tolerated, currently on 7 L -Repeat echo with preserved EF, LVH, mildly reduced RV, grade 2 diastolic dysfunction -PT/OT following, he declines SNF -TOC consult for home CPAP  Severe OSA/OHS -Recent sleep study with severe OSA, reportedly has not been started on CPAP,  -Started on CPAP here, TOC consult for home CPAP -Needs to lose weight as well  Severe hypothyroidism -TSH 83 with free T4 of <0.25, not on Synthroid at baseline, started IV Synthroid given severity of hypothyroidism, likely amiodarone induced -Will need repeat labs in 6 weeks -Switch to oral Synthroid today  Paroxysmal atrial for flutter -Currently in sinus rhythm, amiodarone causing severe hypothyroidism, holding Amio, switch to apixaban   Acute metabolic encephalopathy -Likely from hypoxia and  hypercarbia -Improving, monitor  Chronic venous insufficiency  DVT prophylaxis: Hep>Eliquis Code Status: Full code Family Communication: None present Disposition Plan: Home likely 1 to 2 days  Consultants: Cards   Procedures:   Antimicrobials:    Objective: Vitals:   02/09/23 0000 02/09/23 0030 02/09/23 0330 02/09/23 0749  BP:  109/77 117/60 104/79  Pulse:  (!) 57 (!) 55 70  Resp: 15 19 16 19   Temp:   (!) 97.2 F (36.2 C) 99.2 F (37.3 C)  TempSrc:   Axillary Oral  SpO2: 96% 96% 95% 96%  Weight:      Height:        Intake/Output Summary (Last 24 hours) at 02/09/2023 1205 Last data filed at 02/09/2023 0857 Gross per 24 hour  Intake 520.68 ml  Output 975 ml  Net -454.32 ml   Filed Weights   02/06/23 2111 02/07/23 0413 02/08/23 0401  Weight: (!) 137.5 kg (!) 136.4 kg 134.1 kg    Examination:  General exam: Obese chronically ill male sitting up in bed, AAO x 2 HEENT: Neck obese unable to assess JVD CVS: S1-S2, regular rhythm Lungs: Decreased breath sounds at the bases Abdomen: Soft, nontender, bowel sounds present Extremities: 1+ edema, chronic skin changes and hyperpigmentation Psychiatry: Flat affect    Data Reviewed:   CBC: Recent Labs  Lab 02/06/23 1434 02/06/23 1505 02/06/23 1628 02/06/23 1954 02/07/23 0054 02/08/23 0105 02/09/23 0104  WBC 4.5  --   --   --  5.3 5.3 5.9  NEUTROABS  --   --   --   --  3.1  --   --   HGB 16.0   < > 18.7* 18.7* 16.8 16.6 16.3  HCT 51.8   < > 55.0* 55.0* 52.2* 51.9 51.3  MCV 107.5*  --   --   --  106.3* 105.9* 105.8*  PLT 142*  --   --   --  149* 147* 150   < > = values in this interval not displayed.   Basic Metabolic Panel: Recent Labs  Lab 02/06/23 1434 02/06/23 1505 02/06/23 1628 02/06/23 1954 02/06/23 2045 02/07/23 0600 02/08/23 0105 02/09/23 1036  NA 132*   < > 127* 130*  --  133* 131* 132*  K 4.6   < > 4.2 4.2  --  5.5* 3.7 3.9  CL 80*  --   --   --   --  80* 82* 81*  CO2 42*  --   --   --    --  37* 39* 36*  GLUCOSE 90  --   --   --   --  65* 88 134*  BUN 12  --   --   --   --  13 13 15   CREATININE 1.76*  --   --   --   --  1.48* 1.64* 1.95*  CALCIUM 8.9  --   --   --   --  9.2 9.0 9.3  MG  --   --   --   --  2.1  --   --   --    < > = values in this interval not displayed.   GFR: Estimated Creatinine Clearance: 42.5 mL/min (A) (by C-G formula based on SCr of 1.95 mg/dL (H)). Liver Function Tests: Recent Labs  Lab 02/06/23 1434 02/07/23 0600  AST 41 53*  ALT 15 17  ALKPHOS 56 55  BILITOT 1.5* 2.0*  PROT 6.5 6.6  ALBUMIN 3.3* 3.6   No results for input(s): "LIPASE", "AMYLASE" in the last 168 hours. Recent Labs  Lab 02/06/23 1705  AMMONIA 21   Coagulation Profile: No results for input(s): "INR", "PROTIME" in the last 168 hours. Cardiac Enzymes: No results for input(s): "CKTOTAL", "CKMB", "CKMBINDEX", "TROPONINI" in the last 168 hours. BNP (last 3 results) No results for input(s): "PROBNP" in the last 8760 hours. HbA1C: No results for input(s): "HGBA1C" in the last 72 hours. CBG: Recent Labs  Lab 02/06/23 1505  GLUCAP 86   Lipid Profile: No results for input(s): "CHOL", "HDL", "LDLCALC", "TRIG", "CHOLHDL", "LDLDIRECT" in the last 72 hours. Thyroid Function Tests: Recent Labs    02/06/23 1705 02/06/23 2045  TSH 83.727*  --   FREET4  --  <0.25*   Anemia Panel: No results for input(s): "VITAMINB12", "FOLATE", "FERRITIN", "TIBC", "IRON", "RETICCTPCT" in the last 72 hours. Urine analysis:    Component Value Date/Time   COLORURINE YELLOW 09/24/2020 1354   APPEARANCEUR CLEAR 09/24/2020 1354   LABSPEC 1.014 09/24/2020 1354   PHURINE 5.0 09/24/2020 1354   GLUCOSEU NEGATIVE 09/24/2020 1354   HGBUR SMALL (A) 09/24/2020 1354   BILIRUBINUR NEGATIVE 09/24/2020 1354   KETONESUR NEGATIVE 09/24/2020 1354   PROTEINUR NEGATIVE 09/24/2020 1354   NITRITE NEGATIVE 09/24/2020 1354   LEUKOCYTESUR NEGATIVE 09/24/2020 1354   Sepsis  Labs: @LABRCNTIP (procalcitonin:4,lacticidven:4)  )No results found for this or any previous visit (from the past 240 hour(s)).   Radiology Studies: ECHOCARDIOGRAM COMPLETE  Result Date: 02/08/2023    ECHOCARDIOGRAM REPORT   Patient Name:   KEEGEN FURNER Date of Exam: 02/08/2023 Medical Rec #:  [id number]     Height:       68.0 in Accession #:  [id number]    Weight:       295.6 lb Date of Birth:  [birth year]     BSA:          2.413 m Patient Age:    77 years      BP:           104/55 mmHg Patient Gender: M             HR:           56 bpm. Exam Location:  Inpatient Procedure: 2D Echo, Cardiac Doppler, Color Doppler and Intracardiac            Opacification Agent Indications:    CHF-Acute Diastolic I50.31  History:        Patient has prior history of Echocardiogram examinations, most                 recent 03/19/2021. CHF, Arrythmias:Atrial Fibrillation and Atrial                 Flutter; Risk Factors:Hypertension and Sleep Apnea. CKD, stage                 3.  Sonographer:    Lucendia Herrlich Referring Phys: [id number] SUBRINA SUNDIL  Sonographer Comments: Technically difficult study due to poor echo windows, suboptimal apical window, suboptimal subcostal window and patient is obese. IMPRESSIONS  1. Left ventricular ejection fraction, by estimation, is 65 to 70%. The left ventricle has normal function. The left ventricle has no regional wall motion abnormalities. There is mild asymmetric left ventricular hypertrophy of the basal segment. Left ventricular diastolic parameters are consistent with Grade II diastolic dysfunction (pseudonormalization).  2. Right ventricular systolic function is mildly reduced. The right ventricular size is normal. Tricuspid regurgitation signal is inadequate for assessing PA pressure.  3. Left atrial size was upper normal.  4. The mitral valve is grossly normal. Trivial mitral valve regurgitation.  5. The aortic valve is tricuspid. There is mild calcification of the aortic valve. Aortic  valve regurgitation is not visualized. Aortic valve sclerosis is present, with no evidence of aortic valve stenosis.  6. The inferior vena cava is normal in size with greater than 50% respiratory variability, suggesting right atrial pressure of 3 mmHg. Comparison(s): Prior images reviewed side by side. LVEF vigorous at 65-70% with moderate diastolic dysfunction. FINDINGS  Left Ventricle: Left ventricular ejection fraction, by estimation, is 65 to 70%. The left ventricle has normal function. The left ventricle has no regional wall motion abnormalities. Definity contrast agent was given IV to delineate the left ventricular  endocardial borders. The left ventricular internal cavity size was normal in size. There is mild asymmetric left ventricular hypertrophy of the basal segment. Left ventricular diastolic parameters are consistent with Grade II diastolic dysfunction (pseudonormalization). Right Ventricle: The right ventricular size is normal. No increase in right ventricular wall thickness. Right ventricular systolic function is mildly reduced. Tricuspid regurgitation signal is inadequate for assessing PA pressure. The tricuspid regurgitant velocity is 0.81 m/s, and with an assumed right atrial pressure of 3 mmHg, the estimated right ventricular systolic pressure is 5.6 mmHg. Left Atrium: Left atrial size was upper normal. Right Atrium: Right atrial size was normal in size. Pericardium: There is no evidence of pericardial effusion. Presence of epicardial fat layer. Mitral Valve: The mitral valve is grossly normal. Trivial mitral valve regurgitation. Tricuspid Valve: The tricuspid valve is grossly normal. Tricuspid valve regurgitation is trivial. Aortic Valve: The aortic valve is tricuspid. There is mild calcification  of the aortic valve. There is mild aortic valve annular calcification. Aortic valve regurgitation is not visualized. Aortic valve sclerosis is present, with no evidence of aortic valve stenosis. Aortic  valve peak gradient measures 5.2 mmHg. Pulmonic Valve: The pulmonic valve was grossly normal. Pulmonic valve regurgitation is trivial. Aorta: The aortic root is normal in size and structure. Venous: The inferior vena cava is normal in size with greater than 50% respiratory variability, suggesting right atrial pressure of 3 mmHg. IAS/Shunts: No atrial level shunt detected by color flow Doppler.  LEFT VENTRICLE PLAX 2D LVIDd:         4.90 cm   Diastology LVIDs:         3.40 cm   LV e' medial:    5.26 cm/s LV PW:         0.80 cm   LV E/e' medial:  13.3 LV IVS:        1.20 cm   LV e' lateral:   5.59 cm/s LVOT diam:     2.00 cm   LV E/e' lateral: 12.5 LV SV:         53 LV SV Index:   22 LVOT Area:     3.14 cm  RIGHT VENTRICLE            IVC RV S prime:     7.70 cm/s  IVC diam: 1.60 cm LEFT ATRIUM           Index        RIGHT ATRIUM           Index LA diam:      3.60 cm 1.49 cm/m   RA Area:     21.80 cm LA Vol (A4C): 79.9 ml 33.11 ml/m  RA Volume:   59.60 ml  24.70 ml/m  AORTIC VALVE AV Area (Vmax): 2.33 cm AV Vmax:        114.00 cm/s AV Peak Grad:   5.2 mmHg LVOT Vmax:      84.40 cm/s LVOT Vmean:     54.300 cm/s LVOT VTI:       0.169 m  AORTA Ao Root diam: 3.60 cm Ao Asc diam:  3.50 cm MITRAL VALVE               TRICUSPID VALVE MV Area (PHT): 2.91 cm    TR Peak grad:   2.6 mmHg MV Decel Time: 261 msec    TR Vmax:        80.80 cm/s MV E velocity: 70.10 cm/s MV A velocity: 53.90 cm/s  SHUNTS MV E/A ratio:  1.30        Systemic VTI:  0.17 m                            Systemic Diam: 2.00 cm Nona Dell MD Electronically signed by Nona Dell MD Signature Date/Time: 02/08/2023/12:56:47 PM    Final      Scheduled Meds:  apixaban  5 mg Oral BID   [START ON 02/10/2023] bumetanide  2 mg Oral BID   ipratropium-albuterol  3 mL Nebulization Q6H   levothyroxine  50 mcg Intravenous Daily   midodrine  10 mg Oral BID WC   sodium chloride flush  3 mL Intravenous Q12H   Continuous Infusions:  sodium chloride        LOS: 3 days    Time spent:    Zannie Cove, MD [facility]   02/09/2023, 12:05  PM

## 2023-02-09 NOTE — Progress Notes (Signed)
Rounding Note    Patient Name: [employer] Date of Encounter: 02/09/2023   [facility] Cardiologist: Thurmon Fair, MD   Subjective   No issues overnight- net negative another 600 cc, now 4.2L negative. Plan to switch to home Bumex dosing.   Inpatient Medications    Scheduled Meds:  apixaban  5 mg Oral BID   furosemide  80 mg Intravenous BID   ipratropium-albuterol  3 mL Nebulization Q6H   levothyroxine  50 mcg Intravenous Daily   midodrine  10 mg Oral BID WC   sodium chloride flush  3 mL Intravenous Q12H   Continuous Infusions:  sodium chloride     PRN Meds: sodium chloride, acetaminophen **OR** acetaminophen, albuterol, hydrALAZINE, ondansetron **OR** ondansetron (ZOFRAN) IV, sodium chloride flush   Vital Signs    Vitals:   02/09/23 0000 02/09/23 0030 02/09/23 0330 02/09/23 0749  BP:  109/77 117/60 104/79  Pulse:  (!) 57 (!) 55 70  Resp: 15 19 16 19   Temp:   (!) 97.2 F (36.2 C) 99.2 F (37.3 C)  TempSrc:   Axillary Oral  SpO2: 96% 96% 95% 96%  Weight:      Height:        Intake/Output Summary (Last 24 hours) at 02/09/2023 0906 Last data filed at 02/09/2023 0857 Gross per 24 hour  Intake 552.01 ml  Output 1175 ml  Net -622.99 ml      02/08/2023    4:01 AM 02/07/2023    4:13 AM 02/06/2023    9:11 PM  Last 3 Weights  Weight (lbs) 295 lb 9.6 oz 300 lb 11.2 oz 303 lb 1.6 oz  Weight (kg) 134.083 kg 136.397 kg 137.485 kg      Telemetry    Sinus rhythm- Personally Reviewed  Physical Exam   GEN: No acute distress.  Obese Neck: No JVD Cardiac: RRR Respiratory: Clear to auscultation bilaterally. GI: Soft, nontender, non-distended  MS: no edema; chronic skin changes Neuro:  Nonfocal  Psych: Normal affect   Labs    High Sensitivity Troponin:   Recent Labs  Lab 02/06/23 2045 02/06/23 2206  TROPONINIHS 35* 28*     Chemistry Recent Labs  Lab 02/06/23 1434 02/06/23 1505 02/06/23 1954 02/06/23 2045 02/07/23 0600 02/08/23 0105  NA  132*   < > 130*  --  133* 131*  K 4.6   < > 4.2  --  5.5* 3.7  CL 80*  --   --   --  80* 82*  CO2 42*  --   --   --  37* 39*  GLUCOSE 90  --   --   --  65* 88  BUN 12  --   --   --  13 13  CREATININE 1.76*  --   --   --  1.48* 1.64*  CALCIUM 8.9  --   --   --  9.2 9.0  MG  --   --   --  2.1  --   --   PROT 6.5  --   --   --  6.6  --   ALBUMIN 3.3*  --   --   --  3.6  --   AST 41  --   --   --  53*  --   ALT 15  --   --   --  17  --   ALKPHOS 56  --   --   --  55  --   BILITOT 1.5*  --   --   --  2.0*  --   GFRNONAA 39*  --   --   --  48* 43*  ANIONGAP 10  --   --   --  16* 10   < > = values in this interval not displayed.     Hematology Recent Labs  Lab 02/07/23 0054 02/08/23 0105 02/09/23 0104  WBC 5.3 5.3 5.9  RBC 4.91 4.90 4.85  HGB 16.8 16.6 16.3  HCT 52.2* 51.9 51.3  MCV 106.3* 105.9* 105.8*  MCH 34.2* 33.9 33.6  MCHC 32.2 32.0 31.8  RDW 15.9* 15.9* 16.0*  PLT 149* 147* 150   Thyroid  Recent Labs  Lab 02/06/23 1705 02/06/23 2045  TSH 83.727*  --   FREET4  --  <0.25*    BNP Recent Labs  Lab 02/06/23 1705  BNP 174.1*    DDimer  Recent Labs  Lab 02/06/23 2045  DDIMER 0.63*     Radiology    ECHOCARDIOGRAM COMPLETE  Result Date: 02/08/2023    ECHOCARDIOGRAM REPORT   Patient Name:   DORAIN CICOTTE Date of Exam: 02/08/2023 Medical Rec #:  [id number]     Height:       68.0 in Accession #:    [id number]    Weight:       295.6 lb Date of Birth:  14-Sep-1945     BSA:          2.413 m Patient Age:    77 years      BP:           104/55 mmHg Patient Gender: M             HR:           56 bpm. Exam Location:  Inpatient Procedure: 2D Echo, Cardiac Doppler, Color Doppler and Intracardiac            Opacification Agent Indications:    CHF-Acute Diastolic I50.31  History:        Patient has prior history of Echocardiogram examinations, most                 recent 03/19/2021. CHF, Arrythmias:Atrial Fibrillation and Atrial                 Flutter; Risk Factors:Hypertension and  Sleep Apnea. CKD, stage                 3.  Sonographer:    Lucendia Herrlich Referring Phys: [id number] SUBRINA SUNDIL  Sonographer Comments: Technically difficult study due to poor echo windows, suboptimal apical window, suboptimal subcostal window and patient is obese. IMPRESSIONS  1. Left ventricular ejection fraction, by estimation, is 65 to 70%. The left ventricle has normal function. The left ventricle has no regional wall motion abnormalities. There is mild asymmetric left ventricular hypertrophy of the basal segment. Left ventricular diastolic parameters are consistent with Grade II diastolic dysfunction (pseudonormalization).  2. Right ventricular systolic function is mildly reduced. The right ventricular size is normal. Tricuspid regurgitation signal is inadequate for assessing PA pressure.  3. Left atrial size was upper normal.  4. The mitral valve is grossly normal. Trivial mitral valve regurgitation.  5. The aortic valve is tricuspid. There is mild calcification of the aortic valve. Aortic valve regurgitation is not visualized. Aortic valve sclerosis is present, with no evidence of aortic valve stenosis.  6. The inferior vena cava is normal in size with greater than 50% respiratory variability, suggesting right atrial pressure of 3 mmHg. Comparison(s): Prior images reviewed side by  side. LVEF vigorous at 65-70% with moderate diastolic dysfunction. FINDINGS  Left Ventricle: Left ventricular ejection fraction, by estimation, is 65 to 70%. The left ventricle has normal function. The left ventricle has no regional wall motion abnormalities. Definity contrast agent was given IV to delineate the left ventricular  endocardial borders. The left ventricular internal cavity size was normal in size. There is mild asymmetric left ventricular hypertrophy of the basal segment. Left ventricular diastolic parameters are consistent with Grade II diastolic dysfunction (pseudonormalization). Right Ventricle: The right  ventricular size is normal. No increase in right ventricular wall thickness. Right ventricular systolic function is mildly reduced. Tricuspid regurgitation signal is inadequate for assessing PA pressure. The tricuspid regurgitant velocity is 0.81 m/s, and with an assumed right atrial pressure of 3 mmHg, the estimated right ventricular systolic pressure is 5.6 mmHg. Left Atrium: Left atrial size was upper normal. Right Atrium: Right atrial size was normal in size. Pericardium: There is no evidence of pericardial effusion. Presence of epicardial fat layer. Mitral Valve: The mitral valve is grossly normal. Trivial mitral valve regurgitation. Tricuspid Valve: The tricuspid valve is grossly normal. Tricuspid valve regurgitation is trivial. Aortic Valve: The aortic valve is tricuspid. There is mild calcification of the aortic valve. There is mild aortic valve annular calcification. Aortic valve regurgitation is not visualized. Aortic valve sclerosis is present, with no evidence of aortic valve stenosis. Aortic valve peak gradient measures 5.2 mmHg. Pulmonic Valve: The pulmonic valve was grossly normal. Pulmonic valve regurgitation is trivial. Aorta: The aortic root is normal in size and structure. Venous: The inferior vena cava is normal in size with greater than 50% respiratory variability, suggesting right atrial pressure of 3 mmHg. IAS/Shunts: No atrial level shunt detected by color flow Doppler.  LEFT VENTRICLE PLAX 2D LVIDd:         4.90 cm   Diastology LVIDs:         3.40 cm   LV e' medial:    5.26 cm/s LV PW:         0.80 cm   LV E/e' medial:  13.3 LV IVS:        1.20 cm   LV e' lateral:   5.59 cm/s LVOT diam:     2.00 cm   LV E/e' lateral: 12.5 LV SV:         53 LV SV Index:   22 LVOT Area:     3.14 cm  RIGHT VENTRICLE            IVC RV S prime:     7.70 cm/s  IVC diam: 1.60 cm LEFT ATRIUM           Index        RIGHT ATRIUM           Index LA diam:      3.60 cm 1.49 cm/m   RA Area:     21.80 cm LA Vol (A4C):  79.9 ml 33.11 ml/m  RA Volume:   59.60 ml  24.70 ml/m  AORTIC VALVE AV Area (Vmax): 2.33 cm AV Vmax:        114.00 cm/s AV Peak Grad:   5.2 mmHg LVOT Vmax:      84.40 cm/s LVOT Vmean:     54.300 cm/s LVOT VTI:       0.169 m  AORTA Ao Root diam: 3.60 cm Ao Asc diam:  3.50 cm MITRAL VALVE  TRICUSPID VALVE MV Area (PHT): 2.91 cm    TR Peak grad:   2.6 mmHg MV Decel Time: 261 msec    TR Vmax:        80.80 cm/s MV E velocity: 70.10 cm/s MV A velocity: 53.90 cm/s  SHUNTS MV E/A ratio:  1.30        Systemic VTI:  0.17 m                            Systemic Diam: 2.00 cm Nona Dell MD Electronically signed by Nona Dell MD Signature Date/Time: 02/08/2023/12:56:47 PM    Final       Patient Profile     77 year old male with past medical history of severe obstructive sleep apnea, right heart failure felt secondary to obesity hypoventilation/OSA, paroxysmal atrial fibrillation/flutter, hypertension for evaluation of acute on chronic right-sided heart failure.  Patient has chronic dyspnea on exertion.  He states that is unchanged.  He becomes dyspneic with walking 10 feet.  He denies orthopnea or PND.  He has chronic pedal edema but states this is actually improved compared to previous.  He has not had chest pain, palpitations or syncope.  He complains predominantly of lack of energy and fatigue.  He was admitted and cardiology asked to evaluate.  Chest x-ray shows vascular congestion and small pleural effusions.  Creatinine 1.48, BUN 13, BNP 174, troponin 35 and 28, hemoglobin 16.8, TSH 83.727, free T4 less than 0.25.  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   Assessment & Plan    1 acute on chronic diastolic/right heart failure-appears to be euvolemic; Given lasix 80 mg this am - Transition to home dose of bumex 2 mg BID in AM tomorrow. BMET not available today.   2 hypothyroidism-TSH is markedly  elevated and free T4 less than 0.25.  This is likely the cause of his generalized weakness and fatigue.  Continue levothyroxine.   3 paroxysmal atrial fibrillation/flutter-he is in sinus rhythm.  I am concerned that amiodarone is likely causing his hypothyroidism.  Note TSH July 02, 2022 7.820.  Free T4 was normal.  Amiodarone on hold. CHA2DS2-VASc 4.  Patient is presently on IV heparin.  Will transition back to apixaban at discharged. If amiodarone is necessary to maintain afib, then will have to treat around hypothyroidism.   4 obstructive sleep apnea-needs to be compliant with CPAP at home.   5 obesity hypoventilation syndrome-needs weight loss.  For questions or updates, please contact [facility] Please consult [email or web address] for contact info under   Chrystie Nose, MD, Milagros Loll  [place]  [facility]  Medical Director of the [facility]  [facility] Diplomate of the American Board of Clinical Lipidology Attending Cardiologist  Direct Dial: [phone]  Fax: [phone]  Website:  [email or web address]  Chrystie Nose, MD  02/09/2023, 9:06 AM

## 2023-02-10 DIAGNOSIS — E662 Morbid (severe) obesity with alveolar hypoventilation: Secondary | ICD-10-CM | POA: Diagnosis not present

## 2023-02-10 DIAGNOSIS — I4430 Unspecified atrioventricular block: Secondary | ICD-10-CM | POA: Diagnosis not present

## 2023-02-10 DIAGNOSIS — J9612 Chronic respiratory failure with hypercapnia: Secondary | ICD-10-CM | POA: Diagnosis not present

## 2023-02-10 DIAGNOSIS — I50813 Acute on chronic right heart failure: Secondary | ICD-10-CM | POA: Diagnosis not present

## 2023-02-10 DIAGNOSIS — I480 Paroxysmal atrial fibrillation: Secondary | ICD-10-CM | POA: Diagnosis not present

## 2023-02-10 DIAGNOSIS — J9601 Acute respiratory failure with hypoxia: Secondary | ICD-10-CM | POA: Diagnosis not present

## 2023-02-10 LAB — BASIC METABOLIC PANEL
Anion gap: 11 (ref 5–15)
BUN: 12 mg/dL (ref 8–23)
CO2: 39 mmol/L — ABNORMAL HIGH (ref 22–32)
Calcium: 9.2 mg/dL (ref 8.9–10.3)
Chloride: 80 mmol/L — ABNORMAL LOW (ref 98–111)
Creatinine, Ser: 1.84 mg/dL — ABNORMAL HIGH (ref 0.61–1.24)
GFR, Estimated: 37 mL/min — ABNORMAL LOW (ref >60)
Glucose, Bld: 90 mg/dL (ref 70–99)
Potassium: 3.4 mmol/L — ABNORMAL LOW (ref 3.5–5.1)
Sodium: 130 mmol/L — ABNORMAL LOW (ref 135–145)

## 2023-02-10 LAB — CBC
HCT: 49 % (ref 39.0–52.0)
Hemoglobin: 15.8 g/dL (ref 13.0–17.0)
MCH: 33.8 pg (ref 26.0–34.0)
MCHC: 32.2 g/dL (ref 30.0–36.0)
MCV: 104.7 fL — ABNORMAL HIGH (ref 80.0–100.0)
Platelets: 148 10*3/uL — ABNORMAL LOW (ref 150–400)
RBC: 4.68 MIL/uL (ref 4.22–5.81)
RDW: 15.8 % — ABNORMAL HIGH (ref 11.5–15.5)
WBC: 10.1 10*3/uL (ref 4.0–10.5)
nRBC: 0 % (ref 0.0–0.2)

## 2023-02-10 MED ORDER — SENNOSIDES-DOCUSATE SODIUM 8.6-50 MG PO TABS
2.0000 | ORAL_TABLET | Freq: Every evening | ORAL | Status: DC | PRN
  Administered 2023-02-10: 2 via ORAL
  Filled 2023-02-10: qty 2

## 2023-02-10 MED ORDER — MIDODRINE HCL 5 MG PO TABS
5.0000 mg | ORAL_TABLET | Freq: Two times a day (BID) | ORAL | Status: DC
  Administered 2023-02-10 – 2023-02-12 (×4): 5 mg via ORAL
  Filled 2023-02-10 (×4): qty 1

## 2023-02-10 MED ORDER — POTASSIUM CHLORIDE CRYS ER 20 MEQ PO TBCR
40.0000 meq | EXTENDED_RELEASE_TABLET | Freq: Two times a day (BID) | ORAL | Status: AC
  Administered 2023-02-10 (×2): 40 meq via ORAL
  Filled 2023-02-10 (×2): qty 2

## 2023-02-10 NOTE — TOC Initial Note (Signed)
Transition of Care [facility]) - Initial/Assessment Note    Patient Details  Name: Shane Diaz MRN: [id number] Date of Birth: 02-05-1946  Transition [facility]) CM/SW Contact:    Delilah Shan, LCSWA Phone Number: 02/10/2023, 11:45 AM  Clinical Narrative:                  CSW spoke with patient at bedside using specific interpreters. CSW spoke with Rax # [phone]. Patient reports PTA patient comes from home with support of spouse.  CSW spoke with patient regarding PT recommendation of SNF placement at time of discharge. Patient expressed understanding of PT recommendation and politely declined SNF placement at time of discharge. Patient reports he would like to return home at dc. No further questions reported at this time. CSW to continue to follow and assist with discharge planning needs.   Expected Discharge Plan: Home/Self Care Barriers to Discharge: Continued Medical Work up   Patient Goals and CMS Choice Patient states their goals for this hospitalization and ongoing recovery are:: to return home          Expected Discharge Plan and Services In-house Referral: Clinical Social Work                                            Prior Living Arrangements/Services   Lives with:: Spouse Patient language and need for interpreter reviewed:: Yes Do you feel safe going back to the place where you live?: Yes      Need for Family Participation in Patient Care: Yes (Comment) Care giver support system in place?: Yes (comment)   Criminal Activity/Legal Involvement Pertinent to Current Situation/Hospitalization: No - Comment as needed  Activities of Daily Living Home Assistive Devices/Equipment: BIPAP ADL Screening (condition at time of admission) Patient's cognitive ability adequate to safely complete daily activities?: Yes Is the patient deaf or have difficulty hearing?: No Does the patient have difficulty seeing, even when wearing glasses/contacts?: Yes Does the patient  have difficulty concentrating, remembering, or making decisions?: Yes Patient able to express need for assistance with ADLs?: No Does the patient have difficulty dressing or bathing?: Yes Independently performs ADLs?: No Communication: Independent Dressing (OT): Needs assistance Is this a change from baseline?: Pre-admission baseline Grooming: Independent Feeding: Independent Bathing: Needs assistance Is this a change from baseline?: Pre-admission baseline Toileting: Independent In/Out Bed: Needs assistance Is this a change from baseline?: Pre-admission baseline (Fall at home) [facility] in Home: Needs assistance Is this a change from baseline?: Pre-admission baseline Does the patient have difficulty walking or climbing stairs?: Yes Weakness of Legs: Both Weakness of Arms/Hands: None  Permission Sought/Granted                  Emotional Assessment Appearance:: Appears stated age Attitude/Demeanor/Rapport: Gracious Affect (typically observed): Calm Orientation: : Oriented to Self, Oriented to Place, Oriented to  Time, Oriented to Situation Alcohol / Substance Use: Not Applicable Psych Involvement: No (comment)  Admission diagnosis:  Acute on chronic respiratory failure with hypoxia (HCC) [J96.21] Acute hypoxic on chronic hypercapnic respiratory failure (HCC) [J96.01, J96.12] Patient Active Problem List   Diagnosis Date Noted   CKD stage 3b, GFR 30-44 ml/min (HCC) 02/07/2023   Acute hypoxic respiratory failure (HCC) 02/06/2023   Acute on chronic respiratory failure with hypoxia and hypercapnia (HCC) 02/06/2023   Obesity hypoventilation syndrome (HCC) 02/06/2023   Right-sided heart failure (HCC) 02/06/2023  Cor pulmonale (chronic) (HCC) 02/06/2023   Diastolic heart failure preserved EF (congestive heart failure) 02/06/2023   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   Chronic venous insufficiency 02/06/2023   History of DVT 2015 (deep vein thrombosis) 02/06/2023   Elevated TSH 02/06/2023    Acute hypoxic on chronic hypercapnic respiratory failure (HCC) 02/06/2023   Acute exacerbation of CHF (congestive heart failure) (HCC) 02/06/2023   Acute metabolic encephalopathy 02/06/2023   Posterior vitreous detachment of right eye 04/15/2022   Exudative age-related macular degeneration of left eye with active choroidal neovascularization (HCC) 04/15/2022   Early stage nonexudative age-related macular degeneration of both eyes 04/15/2022   Vitreomacular adhesion of left eye 04/15/2022   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   Hyperkalemia 09/16/2021   Acute on chronic diastolic HF (heart failure) (HCC)    Chronic wound bilateral lower extremity 2nd from chronic venous insufficiency 09/14/2021   Cellulitis- left leg 09/13/2021   Fall 09/13/2021   Chronic respiratory failure with hypoxia-2 to 4L L oxygen at baseline (HCC) 09/13/2021   Atrial flutter (HCC) 03/18/2021   Chronic heart failure with preserved ejection fraction (HFpEF) (HCC) 03/18/2021   Prolonged QT interval 03/18/2021   Acute on chronic diastolic congestive heart failure (HCC)    Cellulitis of leg, right 09/24/2020   Pneumonia due to infectious organism 09/24/2020   Obesity, Class III, BMI 40-49.9 (morbid obesity) (HCC) 09/24/2020   Lactic acidosis 09/24/2020   Sepsis due to pneumonia (HCC) 09/24/2020   Acute diastolic heart failure (HCC) 08/03/2020   OSA (obstructive sleep apnea) 08/03/2020   DVT, bilateral lower limbs (HCC) hx of 08/03/2020   Coronary artery calcification seen on CT scan 08/03/2020   Atrial fibrillation with RVR (HCC) 08/01/2020   Iron deficiency anemia    Occult blood in stools    Atrial fibrillation, chronic (HCC) 07/07/2020   Pneumonia due to COVID-19 virus 05/08/2020   Sepsis (HCC) 05/08/2020   Atrial fibrillation with rapid ventricular response (HCC) 05/08/2020   Elevated troponin 05/08/2020   Essential hypertension 05/08/2020   Varicose veins of leg with complications 06/25/2015   Varicose  veins of left lower extremity with complications 06/18/2015   Varicose veins of bilateral lower extremities with other complications 05/29/2015   Special screening for malignant neoplasms, colon 04/11/2014   PCP:  Ralene Ok, MD Pharmacy:   [facility] [id number] - [place], [place] - [address] [place] [address] Phone: [phone] Fax: [phone]  [facility] [address] [place] [address] Phone: [phone] Fax: [phone]     Social Determinants of Health (SDOH) Social History: SDOH Screenings   Food Insecurity: No Food Insecurity (02/07/2023)  Housing: Low Risk  (02/07/2023)  Transportation Needs: No Transportation Needs (02/07/2023)  Utilities: Not At Risk (02/07/2023)  Alcohol Screen: Low Risk  (02/09/2023)  Financial Resource Strain: Low Risk  (02/09/2023)  Tobacco Use: Low Risk  (02/09/2023)   SDOH Interventions: Alcohol Usage Interventions: Intervention Not Indicated (Score <7) Financial Strain Interventions: Intervention Not Indicated   Readmission Risk Interventions     No data to display

## 2023-02-10 NOTE — Progress Notes (Signed)
Rounding Note    Patient Name: Shane Diaz Date of Encounter: 02/10/2023  [facility] Cardiologist: Thurmon Fair, MD   Subjective   Cased discussed with Mr. Fuehrer' primary cardiologist Dr. Royann Shivers today- he has been titrated for Bipap by Dr. Mayford Knife- we have ordered it and he is agreeable to treatment. Noted to have 2:1 AV block yesterday, not clearly symptomatic - may be driving by OHS. No further events overnight, but appears to have episodic slow wide-complex rhythm, may be VT.  Inpatient Medications    Scheduled Meds:  apixaban  5 mg Oral BID   bumetanide  2 mg Oral BID   ipratropium-albuterol  3 mL Nebulization Q6H   levothyroxine  100 mcg Oral Q0600   midodrine  5 mg Oral BID WC   potassium chloride  40 mEq Oral BID   sodium chloride flush  3 mL Intravenous Q12H   Continuous Infusions:  sodium chloride     PRN Meds: sodium chloride, acetaminophen **OR** acetaminophen, albuterol, ondansetron **OR** ondansetron (ZOFRAN) IV, senna-docusate, sodium chloride flush   Vital Signs    Vitals:   02/09/23 2031 02/10/23 0041 02/10/23 0421 02/10/23 0830  BP:  128/67 119/65 111/61  Pulse:  60 (!) 55 68  Resp:  16 20 20   Temp:  99.1 F (37.3 C) 98.8 F (37.1 C) 98.6 F (37 C)  TempSrc:  Oral Axillary Oral  SpO2: 96% 96% 97% 92%  Weight:   133.8 kg   Height:        Intake/Output Summary (Last 24 hours) at 02/10/2023 1116 Last data filed at 02/10/2023 0648 Gross per 24 hour  Intake 640 ml  Output 325 ml  Net 315 ml      02/10/2023    4:21 AM 02/08/2023    4:01 AM 02/07/2023    4:13 AM  Last 3 Weights  Weight (lbs) 294 lb 15.6 oz 295 lb 9.6 oz 300 lb 11.2 oz  Weight (kg) 133.8 kg 134.083 kg 136.397 kg      Telemetry    Sinus rhythm with episodic slow wide complex rhythm- Personally Reviewed  Physical Exam   GEN: No acute distress.  Obese Neck: No JVD Cardiac: RRR Respiratory: Clear to auscultation bilaterally. GI: Soft, nontender, non-distended  MS:  no edema; chronic skin changes Neuro:  Nonfocal  Psych: Normal affect   Labs    High Sensitivity Troponin:   Recent Labs  Lab 02/06/23 2045 02/06/23 2206  TROPONINIHS 35* 28*     Chemistry Recent Labs  Lab 02/06/23 1434 02/06/23 1505 02/06/23 2045 02/07/23 0600 02/08/23 0105 02/09/23 1036 02/10/23 0051  NA 132*   < >  --  133* 131* 132* 130*  K 4.6   < >  --  5.5* 3.7 3.9 3.4*  CL 80*  --   --  80* 82* 81* 80*  CO2 42*  --   --  37* 39* 36* 39*  GLUCOSE 90  --   --  65* 88 134* 90  BUN 12  --   --  13 13 15 12   CREATININE 1.76*  --   --  1.48* 1.64* 1.95* 1.84*  CALCIUM 8.9  --   --  9.2 9.0 9.3 9.2  MG  --   --  2.1  --   --   --   --   PROT 6.5  --   --  6.6  --   --   --   ALBUMIN 3.3*  --   --  3.6  --   --   --   AST 41  --   --  53*  --   --   --   ALT 15  --   --  17  --   --   --   ALKPHOS 56  --   --  55  --   --   --   BILITOT 1.5*  --   --  2.0*  --   --   --   GFRNONAA 39*  --   --  48* 43* 35* 37*  ANIONGAP 10  --   --  16* 10 15 11    < > = values in this interval not displayed.     Hematology Recent Labs  Lab 02/08/23 0105 02/09/23 0104 02/10/23 0051  WBC 5.3 5.9 10.1  RBC 4.90 4.85 4.68  HGB 16.6 16.3 15.8  HCT 51.9 51.3 49.0  MCV 105.9* 105.8* 104.7*  MCH 33.9 33.6 33.8  MCHC 32.0 31.8 32.2  RDW 15.9* 16.0* 15.8*  PLT 147* 150 148*   Thyroid  Recent Labs  Lab 02/06/23 1705 02/06/23 2045  TSH 83.727*  --   FREET4  --  <0.25*    BNP Recent Labs  Lab 02/06/23 1705  BNP 174.1*    DDimer  Recent Labs  Lab 02/06/23 2045  DDIMER 0.63*     Radiology    ECHOCARDIOGRAM COMPLETE  Result Date: 02/08/2023    ECHOCARDIOGRAM REPORT   Patient Name:   Shane Diaz Date of Exam: 02/08/2023 Medical Rec #:  [id number]     Height:       68.0 in Accession #:    [id number]    Weight:       295.6 lb Date of Birth:  [birth year]     BSA:          2.413 m Patient Age:    77 years      BP:           104/55 mmHg Patient Gender: M             HR:            56 bpm. Exam Location:  Inpatient Procedure: 2D Echo, Cardiac Doppler, Color Doppler and Intracardiac            Opacification Agent Indications:    CHF-Acute Diastolic I50.31  History:        Patient has prior history of Echocardiogram examinations, most                 recent 03/19/2021. CHF, Arrythmias:Atrial Fibrillation and Atrial                 Flutter; Risk Factors:Hypertension and Sleep Apnea. CKD, stage                 3.  Sonographer:    Lucendia Herrlich Referring Phys: [id number] SUBRINA SUNDIL  Sonographer Comments: Technically difficult study due to poor echo windows, suboptimal apical window, suboptimal subcostal window and patient is obese. IMPRESSIONS  1. Left ventricular ejection fraction, by estimation, is 65 to 70%. The left ventricle has normal function. The left ventricle has no regional wall motion abnormalities. There is mild asymmetric left ventricular hypertrophy of the basal segment. Left ventricular diastolic parameters are consistent with Grade II diastolic dysfunction (pseudonormalization).  2. Right ventricular systolic function is mildly reduced. The right ventricular size is normal. Tricuspid regurgitation signal is inadequate for assessing PA pressure.  3.  Left atrial size was upper normal.  4. The mitral valve is grossly normal. Trivial mitral valve regurgitation.  5. The aortic valve is tricuspid. There is mild calcification of the aortic valve. Aortic valve regurgitation is not visualized. Aortic valve sclerosis is present, with no evidence of aortic valve stenosis.  6. The inferior vena cava is normal in size with greater than 50% respiratory variability, suggesting right atrial pressure of 3 mmHg. Comparison(s): Prior images reviewed side by side. LVEF vigorous at 65-70% with moderate diastolic dysfunction. FINDINGS  Left Ventricle: Left ventricular ejection fraction, by estimation, is 65 to 70%. The left ventricle has normal function. The left ventricle has no regional wall  motion abnormalities. Definity contrast agent was given IV to delineate the left ventricular  endocardial borders. The left ventricular internal cavity size was normal in size. There is mild asymmetric left ventricular hypertrophy of the basal segment. Left ventricular diastolic parameters are consistent with Grade II diastolic dysfunction (pseudonormalization). Right Ventricle: The right ventricular size is normal. No increase in right ventricular wall thickness. Right ventricular systolic function is mildly reduced. Tricuspid regurgitation signal is inadequate for assessing PA pressure. The tricuspid regurgitant velocity is 0.81 m/s, and with an assumed right atrial pressure of 3 mmHg, the estimated right ventricular systolic pressure is 5.6 mmHg. Left Atrium: Left atrial size was upper normal. Right Atrium: Right atrial size was normal in size. Pericardium: There is no evidence of pericardial effusion. Presence of epicardial fat layer. Mitral Valve: The mitral valve is grossly normal. Trivial mitral valve regurgitation. Tricuspid Valve: The tricuspid valve is grossly normal. Tricuspid valve regurgitation is trivial. Aortic Valve: The aortic valve is tricuspid. There is mild calcification of the aortic valve. There is mild aortic valve annular calcification. Aortic valve regurgitation is not visualized. Aortic valve sclerosis is present, with no evidence of aortic valve stenosis. Aortic valve peak gradient measures 5.2 mmHg. Pulmonic Valve: The pulmonic valve was grossly normal. Pulmonic valve regurgitation is trivial. Aorta: The aortic root is normal in size and structure. Venous: The inferior vena cava is normal in size with greater than 50% respiratory variability, suggesting right atrial pressure of 3 mmHg. IAS/Shunts: No atrial level shunt detected by color flow Doppler.  LEFT VENTRICLE PLAX 2D LVIDd:         4.90 cm   Diastology LVIDs:         3.40 cm   LV e' medial:    5.26 cm/s LV PW:         0.80 cm   LV  E/e' medial:  13.3 LV IVS:        1.20 cm   LV e' lateral:   5.59 cm/s LVOT diam:     2.00 cm   LV E/e' lateral: 12.5 LV SV:         53 LV SV Index:   22 LVOT Area:     3.14 cm  RIGHT VENTRICLE            IVC RV S prime:     7.70 cm/s  IVC diam: 1.60 cm LEFT ATRIUM           Index        RIGHT ATRIUM           Index LA diam:      3.60 cm 1.49 cm/m   RA Area:     21.80 cm LA Vol (A4C): 79.9 ml 33.11 ml/m  RA Volume:   59.60 ml  24.70 ml/m  AORTIC VALVE AV Area (Vmax): 2.33 cm AV Vmax:        114.00 cm/s AV Peak Grad:   5.2 mmHg LVOT Vmax:      84.40 cm/s LVOT Vmean:     54.300 cm/s LVOT VTI:       0.169 m  AORTA Ao Root diam: 3.60 cm Ao Asc diam:  3.50 cm MITRAL VALVE               TRICUSPID VALVE MV Area (PHT): 2.91 cm    TR Peak grad:   2.6 mmHg MV Decel Time: 261 msec    TR Vmax:        80.80 cm/s MV E velocity: 70.10 cm/s MV A velocity: 53.90 cm/s  SHUNTS MV E/A ratio:  1.30        Systemic VTI:  0.17 m                            Systemic Diam: 2.00 cm Nona Dell MD Electronically signed by Nona Dell MD Signature Date/Time: 02/08/2023/12:56:47 PM    Final       Patient Profile     77 year old male with past medical history of severe obstructive sleep apnea, right heart failure felt secondary to obesity hypoventilation/OSA, paroxysmal atrial fibrillation/flutter, hypertension for evaluation of acute on chronic right-sided heart failure.  Patient has chronic dyspnea on exertion.  He states that is unchanged.  He becomes dyspneic with walking 10 feet.  He denies orthopnea or PND.  He has chronic pedal edema but states this is actually improved compared to previous.  He has not had chest pain, palpitations or syncope.  He complains predominantly of lack of energy and fatigue.  He was admitted and cardiology asked to evaluate.  Chest x-ray shows vascular congestion and small pleural effusions.  Creatinine 1.48, BUN 13, BNP 174, troponin 35 and 28, hemoglobin 16.8, TSH 83.727, free T4 less than  0.25.  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 Creatinine improved to 1.84 today (from 1.95)   2 hypothyroidism-TSH is markedly elevated  at 83 and free T4 less than 0.25.  This is likely the cause of his generalized weakness and fatigue.  Continue levothyroxine.   3 paroxysmal atrial fibrillation/flutter-he is in sinus rhythm.  I am concerned that amiodarone is likely causing his hypothyroidism.  Note TSH July 02, 2022 7.820.  Free T4 was normal.  Amiodarone on hold. CHA2DS2-VASc 4.  Patient is presently on IV heparin.  Will transition back to apixaban at discharged. Has had bradycardia with 2:1 AVB and slow wide=complex rhythms while here, but asymptomatic.   4 obstructive sleep apnea-needs to be compliant with BIPAP at home. Ordered and fitted for BIPAP here, he said he would use it at night or with naps.   5 obesity hypoventilation syndrome-needs weight loss.   For questions or updates, please contact [facility] Please consult [email or web address] for contact info under   Chrystie Nose, MD, Milagros Loll  [place]  [facility]  Medical Director of the [facility]  [facility] Diplomate of the American Board of Clinical Lipidology Attending Cardiologist  Direct Dial: [phone]  Fax: [phone]  Website:  [email or web address]  Chrystie Nose, MD  02/10/2023, 11:16 AM

## 2023-02-10 NOTE — Progress Notes (Signed)
   02/10/23 0147  BiPAP/CPAP/SIPAP  $ Non-Invasive Ventilator  Non-Invasive Vent Subsequent  BiPAP/CPAP/SIPAP Pt Type Adult  BiPAP/CPAP/SIPAP V60  Mask Type Full face mask  Mask Size Large  Set Rate 20 breaths/min  Respiratory Rate 22 breaths/min  IPAP (S)  14 cmH20 (for pt comfort)  EPAP 8 cmH2O  FiO2 (%) 50 %  Minute Ventilation 11.4  Leak 46  Peak Inspiratory Pressure (PIP) 14  Tidal Volume (Vt) 573  Patient Home Equipment No  Auto Titrate No  Press High Alarm 30 cmH2O  Press Low Alarm 5 cmH2O  CPAP/SIPAP surface wiped down Yes   Pt resting comfortably on bipap.  RT will continue to monitor

## 2023-02-10 NOTE — TOC Progression Note (Addendum)
Transition of Care [facility]) - Progression Note    Patient Details  Name: Shane Diaz MRN: [id number] Date of Birth: May 20, 1946  [facility] CM/SW Contact  Leone Haven, RN Phone Number: 02/10/2023, 3:12 PM  Clinical Narrative:    From home with spouse, he speaks English also, NCM notified that he does not want to go to SNF , he wants to go home with wife.  NCM also spoke with patient and he expressed the same information.  His PCP is Dr. Ludwig Clarks, he currently has no HH services in place, he has a walekr, and home oxygen 3 liters at home.  He states his wife will transport him home at dc.  He gets his medications from [facility] on [address].  NCM offered choice for [facility] ,he has no preference.  Will need HHRN, HHPT.HHOT, Child psychotherapist,  he also has sleep apnea per MD and will need a cpap, he has had a sleep study done ,   he has no preference of agency for cpap, NCM made referral to [facility] with [employer] .  NCM made referral to [place] with [facility], she is able to take referral.  Soc will begin 24 to 48 hrs post dc.    Expected Discharge Plan: Home/Self Care Barriers to Discharge: Continued Medical Work up  Expected Discharge Plan and Services In-house Referral: Clinical Social Work                                             Social Determinants of Health (SDOH) Interventions SDOH Screenings   Food Insecurity: No Food Insecurity (02/07/2023)  Housing: Low Risk  (02/07/2023)  Transportation Needs: No Transportation Needs (02/07/2023)  Utilities: Not At Risk (02/07/2023)  Alcohol Screen: Low Risk  (02/09/2023)  Financial Resource Strain: Low Risk  (02/09/2023)  Tobacco Use: Low Risk  (02/09/2023)    Readmission Risk Interventions     No data to display

## 2023-02-10 NOTE — Progress Notes (Signed)
Occupational Therapy Treatment Patient Details Name: Shane Diaz MRN: [id number] DOB: 1945-09-20 Today's Date: 02/10/2023   History of present illness The pt is a 77 yo male presenting 7/5 with AMS, visual hallucinations, generalized weakness. Found to have acute CHF exacerbation, also requiring BiPAP due to hypercapnia and hypoxia. PMH includes: morbid obesity, HTN, afib s/p cardioversion x2 in 2021, atrial flutter with AV block, obesity hypoventilation syndrome on 2L O2, OSA, chronic hypoxic resp failure, venous insufficiency, DVT.   OT comments  Patient supine in bed upon arrival into room.  Patient requires verbal cues of  encouragement for to participate in OT treatment session this date.  Patient stated " I am too weak and I need to rest" but  did eventually agree to sitting EOB which patient completed with mod A and HOB elevated.  Patient educated on importance of getting OOB and for patient asked to attempt standing at sink to complete grooming task, patient refusing and stated that he would complete UB grooming seated EOB, which patient completed seated grooming task with Supervision s/p setup.  Patient returned to supine in bed with max A  and max A for supine scoot to HOB.  Patient would benefit from additional OT intervention to address functional deficits in order for patient to return to PLOF   Recommendations for follow up therapy are one component of a multi-disciplinary discharge planning process, led by the attending physician.  Recommendations may be updated based on patient status, additional functional criteria and insurance authorization.    Assistance Recommended at Discharge Intermittent Supervision/Assistance  Patient can return home with the following  A little help with walking and/or transfers;A little help with bathing/dressing/bathroom   Equipment Recommendations  BSC/3in1    Recommendations for Other Services Rehab consult    Precautions / Restrictions  Precautions Precautions: Fall Precaution Comments: watch O2, 7LO2 Restrictions Weight Bearing Restrictions: No       Mobility Bed Mobility Overal bed mobility: Needs Assistance Bed Mobility: Supine to Sit     Supine to sit: Min assist, HOB elevated Sit to supine: Max assist        Transfers                         Balance                                           ADL either performed or assessed with clinical judgement   ADL Overall ADL's : Needs assistance/impaired     Grooming: Oral care;Wash/dry face;Wash/dry hands;Set up;Supervision/safety           Upper Body Dressing : Moderate assistance;Sitting (to doff jacket inorder for lotion to be placed on back)                          Extremity/Trunk Assessment Upper Extremity Assessment Upper Extremity Assessment: Generalized weakness            Vision       Perception     Praxis      Cognition Arousal/Alertness: Awake/alert Behavior During Therapy: WFL for tasks assessed/performed Overall Cognitive Status: No family/caregiver present to determine baseline cognitive functioning  Exercises      Shoulder Instructions       General Comments      Pertinent Vitals/ Pain       Pain Assessment Pain Assessment: No/denies pain  Home Living                                          Prior Functioning/Environment              Frequency  Min 2X/week        Progress Toward Goals  OT Goals(current goals can now be found in the care plan section)  Progress towards OT goals: Progressing toward goals  Acute Rehab OT Goals OT Goal Formulation: With patient Time For Goal Achievement: 02/22/23 Potential to Achieve Goals: Good  Plan Discharge plan remains appropriate    Co-evaluation                 AM-PAC OT "6 Clicks" Daily Activity     Outcome Measure   Help  from another person eating meals?: A Little Help from another person taking care of personal grooming?: A Little Help from another person toileting, which includes using toliet, bedpan, or urinal?: A Lot Help from another person bathing (including washing, rinsing, drying)?: A Lot Help from another person to put on and taking off regular upper body clothing?: A Lot Help from another person to put on and taking off regular lower body clothing?: A Lot 6 Click Score: 14    End of Session    OT Visit Diagnosis: Unsteadiness on feet (R26.81);Other abnormalities of gait and mobility (R26.89);History of falling (Z91.81);Muscle weakness (generalized) (M62.81)   Activity Tolerance Patient limited by fatigue   Patient Left in bed;with call bell/phone within reach   Nurse Communication Mobility status        Time: 4540-9811 OT Time Calculation (min): 27 min  Charges: OT General Charges $OT Visit: 1 Visit OT Treatments $Self Care/Home Management : 23-37 mins  Governor Specking  OT/L  Denice Paradise 02/10/2023, 12:24 PM

## 2023-02-10 NOTE — Progress Notes (Signed)
PROGRESS NOTE    Shane Diaz  GMW:[id number] DOB: 07-23-1946 DOA: 02/06/2023 PCP: Ralene Ok, MD  77/M with history of severe OSA, right heart failure, chronic venous insufficiency, cor pulmonale, untreated OSA OHS, chronic respiratory failure on home O2 recurrent DVT, persistent A-fib presented to the ED with fatigue, dyspnea on exertion and fall. -Not very compliant with diuretics, has not been started on CPAP for severe sleep apnea -In the ED noted to have 1+ edema, hypoxic placed on 5 L O2, chest x-ray noted cardiomegaly, pulmonary vascular congestion and small pleural effusion, ABG noted hypercarbia, BNP 174, troponin 35, TSH 83 and free T4 less than 0.25 -Admitted, placed on BiPAP and diuretics, subsequently held with drop in blood pressure  Subjective: -Feels better, breathing improving  Assessment and Plan:  Acute on chronic diastolic CHF Right heart failure, cor pulmonale -Has some edema and pulmonary vascular congestion and small effusions noted on x-ray -Required BiPAP on admission, now off -Diuresed with IV Lasix, 4.2 L negative, weight down 9 LB -Changed to oral Bumex today -Continue nightly CPAP, wean O2 as tolerated, currently on 7 L -Repeat echo with preserved EF, LVH, mildly reduced RV, grade 2 diastolic dysfunction -PT/OT following, he declines SNF -TOC consult for home CPAP  Severe OSA/OHS -Recent sleep study with severe OSA, reportedly has not been started on CPAP,  -Started on CPAP here, TOC consult for home CPAP -Needs to lose weight as well  Severe hypothyroidism -TSH 83 with free T4 of <0.25, not on Synthroid at baseline, started IV Synthroid given severity of hypothyroidism, likely amiodarone induced -Will need repeat labs in 6 weeks -Switched to oral Synthroid  Paroxysmal atrial for flutter -Currently in sinus rhythm, amiodarone causing severe hypothyroidism, holding Amio, switch to apixaban   Acute metabolic encephalopathy -Likely from hypoxia and  hypercarbia -Improving, monitor  Chronic venous insufficiency  DVT prophylaxis: Hep>Eliquis Code Status: Full code Family Communication: None present Disposition Plan: Home likely 1 to 2 days once oxygen requirement has improved, declines SNF  Consultants: Cards   Procedures:   Antimicrobials:    Objective: Vitals:   02/10/23 0041 02/10/23 0421 02/10/23 0830 02/10/23 1125  BP: 128/67 119/65 111/61 107/61  Pulse: 60 (!) 55 68 (!) 56  Resp: 16 20 20 20   Temp: 99.1 F (37.3 C) 98.8 F (37.1 C) 98.6 F (37 C) 98.3 F (36.8 C)  TempSrc: Oral Axillary Oral Oral  SpO2: 96% 97% 92% 93%  Weight:  133.8 kg    Height:        Intake/Output Summary (Last 24 hours) at 02/10/2023 1158 Last data filed at 02/10/2023 4403 Gross per 24 hour  Intake 640 ml  Output 325 ml  Net 315 ml   Filed Weights   02/07/23 0413 02/08/23 0401 02/10/23 0421  Weight: (!) 136.4 kg 134.1 kg 133.8 kg    Examination:  General exam: Obese chronically ill male sitting up in bed, AAOx3 2 HEENT: Neck obese unable to assess JVD CVS: S1-S2, regular rhythm Lungs: Decreased breath sounds at the bases Abdomen: Soft, nontender, bowel sounds present  Extremities: 1+ edema, chronic skin changes and hyperpigmentation Psychiatry: Flat affect    Data Reviewed:   CBC: Recent Labs  Lab 02/06/23 1434 02/06/23 1505 02/06/23 1954 02/07/23 0054 02/08/23 0105 02/09/23 0104 02/10/23 0051  WBC 4.5  --   --  5.3 5.3 5.9 10.1  NEUTROABS  --   --   --  3.1  --   --   --   HGB  16.0   < > 18.7* 16.8 16.6 16.3 15.8  HCT 51.8   < > 55.0* 52.2* 51.9 51.3 49.0  MCV 107.5*  --   --  106.3* 105.9* 105.8* 104.7*  PLT 142*  --   --  149* 147* 150 148*   < > = values in this interval not displayed.   Basic Metabolic Panel: Recent Labs  Lab 02/06/23 1434 02/06/23 1505 02/06/23 1954 02/06/23 2045 02/07/23 0600 02/08/23 0105 02/09/23 1036 02/10/23 0051  NA 132*   < > 130*  --  133* 131* 132* 130*  K 4.6   < >  4.2  --  5.5* 3.7 3.9 3.4*  CL 80*  --   --   --  80* 82* 81* 80*  CO2 42*  --   --   --  37* 39* 36* 39*  GLUCOSE 90  --   --   --  65* 88 134* 90  BUN 12  --   --   --  13 13 15 12   CREATININE 1.76*  --   --   --  1.48* 1.64* 1.95* 1.84*  CALCIUM 8.9  --   --   --  9.2 9.0 9.3 9.2  MG  --   --   --  2.1  --   --   --   --    < > = values in this interval not displayed.   GFR: Estimated Creatinine Clearance: 45 mL/min (A) (by C-G formula based on SCr of 1.84 mg/dL (H)). Liver Function Tests: Recent Labs  Lab 02/06/23 1434 02/07/23 0600  AST 41 53*  ALT 15 17  ALKPHOS 56 55  BILITOT 1.5* 2.0*  PROT 6.5 6.6  ALBUMIN 3.3* 3.6   No results for input(s): "LIPASE", "AMYLASE" in the last 168 hours. Recent Labs  Lab 02/06/23 1705  AMMONIA 21   Coagulation Profile: No results for input(s): "INR", "PROTIME" in the last 168 hours. Cardiac Enzymes: No results for input(s): "CKTOTAL", "CKMB", "CKMBINDEX", "TROPONINI" in the last 168 hours. BNP (last 3 results) No results for input(s): "PROBNP" in the last 8760 hours. HbA1C: No results for input(s): "HGBA1C" in the last 72 hours. CBG: Recent Labs  Lab 02/06/23 1505 02/09/23 1547  GLUCAP 86 89   Lipid Profile: No results for input(s): "CHOL", "HDL", "LDLCALC", "TRIG", "CHOLHDL", "LDLDIRECT" in the last 72 hours. Thyroid Function Tests: No results for input(s): "TSH", "T4TOTAL", "FREET4", "T3FREE", "THYROIDAB" in the last 72 hours.  Anemia Panel: No results for input(s): "VITAMINB12", "FOLATE", "FERRITIN", "TIBC", "IRON", "RETICCTPCT" in the last 72 hours. Urine analysis:    Component Value Date/Time   COLORURINE YELLOW 09/24/2020 1354   APPEARANCEUR CLEAR 09/24/2020 1354   LABSPEC 1.014 09/24/2020 1354   PHURINE 5.0 09/24/2020 1354   GLUCOSEU NEGATIVE 09/24/2020 1354   HGBUR SMALL (A) 09/24/2020 1354   BILIRUBINUR NEGATIVE 09/24/2020 1354   KETONESUR NEGATIVE 09/24/2020 1354   PROTEINUR NEGATIVE 09/24/2020 1354    NITRITE NEGATIVE 09/24/2020 1354   LEUKOCYTESUR NEGATIVE 09/24/2020 1354   Sepsis Labs: @LABRCNTIP (procalcitonin:4,lacticidven:4)  )No results found for this or any previous visit (from the past 240 hour(s)).   Radiology Studies: ECHOCARDIOGRAM COMPLETE  Result Date: 02/08/2023    ECHOCARDIOGRAM REPORT   Patient Name:   JODEE MARQUARD Date of Exam: 02/08/2023 Medical Rec #:  [id number]     Height:       68.0 in Accession #:    [id number]    Weight:  295.6 lb Date of Birth:  April 15, 1946     BSA:          2.413 m Patient Age:    42 years      BP:           104/55 mmHg Patient Gender: M             HR:           56 bpm. Exam Location:  Inpatient Procedure: 2D Echo, Cardiac Doppler, Color Doppler and Intracardiac            Opacification Agent Indications:    CHF-Acute Diastolic I50.31  History:        Patient has prior history of Echocardiogram examinations, most                 recent 03/19/2021. CHF, Arrythmias:Atrial Fibrillation and Atrial                 Flutter; Risk Factors:Hypertension and Sleep Apnea. CKD, stage                 3.  Sonographer:    Lucendia Herrlich Referring Phys: [id number] SUBRINA SUNDIL  Sonographer Comments: Technically difficult study due to poor echo windows, suboptimal apical window, suboptimal subcostal window and patient is obese. IMPRESSIONS  1. Left ventricular ejection fraction, by estimation, is 65 to 70%. The left ventricle has normal function. The left ventricle has no regional wall motion abnormalities. There is mild asymmetric left ventricular hypertrophy of the basal segment. Left ventricular diastolic parameters are consistent with Grade II diastolic dysfunction (pseudonormalization).  2. Right ventricular systolic function is mildly reduced. The right ventricular size is normal. Tricuspid regurgitation signal is inadequate for assessing PA pressure.  3. Left atrial size was upper normal.  4. The mitral valve is grossly normal. Trivial mitral valve regurgitation.  5.  The aortic valve is tricuspid. There is mild calcification of the aortic valve. Aortic valve regurgitation is not visualized. Aortic valve sclerosis is present, with no evidence of aortic valve stenosis.  6. The inferior vena cava is normal in size with greater than 50% respiratory variability, suggesting right atrial pressure of 3 mmHg. Comparison(s): Prior images reviewed side by side. LVEF vigorous at 65-70% with moderate diastolic dysfunction. FINDINGS  Left Ventricle: Left ventricular ejection fraction, by estimation, is 65 to 70%. The left ventricle has normal function. The left ventricle has no regional wall motion abnormalities. Definity contrast agent was given IV to delineate the left ventricular  endocardial borders. The left ventricular internal cavity size was normal in size. There is mild asymmetric left ventricular hypertrophy of the basal segment. Left ventricular diastolic parameters are consistent with Grade II diastolic dysfunction (pseudonormalization). Right Ventricle: The right ventricular size is normal. No increase in right ventricular wall thickness. Right ventricular systolic function is mildly reduced. Tricuspid regurgitation signal is inadequate for assessing PA pressure. The tricuspid regurgitant velocity is 0.81 m/s, and with an assumed right atrial pressure of 3 mmHg, the estimated right ventricular systolic pressure is 5.6 mmHg. Left Atrium: Left atrial size was upper normal. Right Atrium: Right atrial size was normal in size. Pericardium: There is no evidence of pericardial effusion. Presence of epicardial fat layer. Mitral Valve: The mitral valve is grossly normal. Trivial mitral valve regurgitation. Tricuspid Valve: The tricuspid valve is grossly normal. Tricuspid valve regurgitation is trivial. Aortic Valve: The aortic valve is tricuspid. There is mild calcification of the aortic valve. There is mild aortic valve annular calcification.  Aortic valve regurgitation is not visualized.  Aortic valve sclerosis is present, with no evidence of aortic valve stenosis. Aortic valve peak gradient measures 5.2 mmHg. Pulmonic Valve: The pulmonic valve was grossly normal. Pulmonic valve regurgitation is trivial. Aorta: The aortic root is normal in size and structure. Venous: The inferior vena cava is normal in size with greater than 50% respiratory variability, suggesting right atrial pressure of 3 mmHg. IAS/Shunts: No atrial level shunt detected by color flow Doppler.  LEFT VENTRICLE PLAX 2D LVIDd:         4.90 cm   Diastology LVIDs:         3.40 cm   LV e' medial:    5.26 cm/s LV PW:         0.80 cm   LV E/e' medial:  13.3 LV IVS:        1.20 cm   LV e' lateral:   5.59 cm/s LVOT diam:     2.00 cm   LV E/e' lateral: 12.5 LV SV:         53 LV SV Index:   22 LVOT Area:     3.14 cm  RIGHT VENTRICLE            IVC RV S prime:     7.70 cm/s  IVC diam: 1.60 cm LEFT ATRIUM           Index        RIGHT ATRIUM           Index LA diam:      3.60 cm 1.49 cm/m   RA Area:     21.80 cm LA Vol (A4C): 79.9 ml 33.11 ml/m  RA Volume:   59.60 ml  24.70 ml/m  AORTIC VALVE AV Area (Vmax): 2.33 cm AV Vmax:        114.00 cm/s AV Peak Grad:   5.2 mmHg LVOT Vmax:      84.40 cm/s LVOT Vmean:     54.300 cm/s LVOT VTI:       0.169 m  AORTA Ao Root diam: 3.60 cm Ao Asc diam:  3.50 cm MITRAL VALVE               TRICUSPID VALVE MV Area (PHT): 2.91 cm    TR Peak grad:   2.6 mmHg MV Decel Time: 261 msec    TR Vmax:        80.80 cm/s MV E velocity: 70.10 cm/s MV A velocity: 53.90 cm/s  SHUNTS MV E/A ratio:  1.30        Systemic VTI:  0.17 m                            Systemic Diam: 2.00 cm Nona Dell MD Electronically signed by Nona Dell MD Signature Date/Time: 02/08/2023/12:56:47 PM    Final      Scheduled Meds:  apixaban  5 mg Oral BID   bumetanide  2 mg Oral BID   ipratropium-albuterol  3 mL Nebulization Q6H   levothyroxine  100 mcg Oral Q0600   midodrine  5 mg Oral BID WC   potassium chloride  40 mEq Oral BID    sodium chloride flush  3 mL Intravenous Q12H   Continuous Infusions:  sodium chloride       LOS: 4 days    Time spent:    Zannie Cove, MD [facility]   02/10/2023, 11:58 AM

## 2023-02-10 NOTE — Progress Notes (Signed)
   Heart Failure Stewardship Pharmacist Progress Note   PCP: Ralene Ok, MD PCP-Cardiologist: Thurmon Fair, MD    HPI:  77 yo M with PMH of afib, obesity hypoventilation syndrome, CHF, OSA complicated by chronic cor pulmonale and right sided failure, severe venous insufficiency, PVD, and VTE.   Presented to the ED on 7/5 with shortness of breath, weakness, AMS, and visual hallucinations. Reported increased oxygen requirements at home, dietary noncompliance, and has not been wearing his CPAP at night. Reports compliance with medications. CXR with cardiomegaly, vascular congestion, and small effusion. CTA negative for PE. BNP 174. TSH 83 and free T4 < 025, thought to be due from amiodarone. Started on levothyroxine and given IV diuretics. ECHO 7/7 showed LVEF 65-70%, no regional wall motion abnormalities, mild asymmetric LVH, G2DD, RV mildly reduced.   Current HF Medications: Diuretic: bumex 2 mg PO BID *also on midodrine 10 mg BID  Prior to admission HF Medications: Diuretic: bumex 2 mg BID Beta blocker: metoprolol XL 50 mg daily MRA: eplerenone 25 mg daily  Pertinent Lab Values: Serum creatinine 1.84, BUN 12, Potassium 3.4, Sodium 130, BNP 174.1, Magnesium 2.1   Vital Signs: Weight: 294 lbs (admission weight: 303 lbs) Blood pressure: 110/60s  Heart rate: 50-60s  I/O: net -3.6L since admission  Medication Assistance / Insurance Benefits Check: Does the patient have prescription insurance?  Yes Type of insurance plan: [facility] Medicare  Outpatient Pharmacy:  Prior to admission [facility] Is the patient willing to use [facility] TOC pharmacy at discharge? Yes Is the patient willing to transition their outpatient pharmacy to utilize a [facility] outpatient pharmacy?   No    Assessment: 1. Acute on chronic diastolic CHF (LVEF 65-70%). NYHA class II symptoms. - Continue bumex 2 mg PO BID. Strict I/Os and daily weights. Keep K>4 and Mg>2. - Holding PTA metoprolol  with bradycardia - BP low requiring midodrine. May be able to wean to 5 mg BID and assess BP. GDMT limited.   - Caution SGLT2i with body habitus   Plan: 1) Medication changes recommended at this time: - Reduce midodrine to 5 mg BID  2) Patient assistance: - None pending  3)  Education  - Patient has been educated on current HF medications and potential additions to HF medication regimen - Patient verbalizes understanding that over the next few months, these medication doses may change and more medications may be added to optimize HF regimen - Patient has been educated on basic disease state pathophysiology and goals of therapy   Sharen Hones, PharmD, BCPS Heart Failure Stewardship Pharmacist Phone [phone]

## 2023-02-11 DIAGNOSIS — I482 Chronic atrial fibrillation, unspecified: Secondary | ICD-10-CM

## 2023-02-11 DIAGNOSIS — G9341 Metabolic encephalopathy: Secondary | ICD-10-CM

## 2023-02-11 DIAGNOSIS — E662 Morbid (severe) obesity with alveolar hypoventilation: Secondary | ICD-10-CM | POA: Diagnosis not present

## 2023-02-11 DIAGNOSIS — J9601 Acute respiratory failure with hypoxia: Secondary | ICD-10-CM | POA: Diagnosis not present

## 2023-02-11 DIAGNOSIS — I5033 Acute on chronic diastolic (congestive) heart failure: Secondary | ICD-10-CM | POA: Diagnosis not present

## 2023-02-11 DIAGNOSIS — N1832 Chronic kidney disease, stage 3b: Secondary | ICD-10-CM

## 2023-02-11 DIAGNOSIS — J9612 Chronic respiratory failure with hypercapnia: Secondary | ICD-10-CM | POA: Diagnosis not present

## 2023-02-11 LAB — BASIC METABOLIC PANEL
Anion gap: 10 (ref 5–15)
BUN: 15 mg/dL (ref 8–23)
CO2: 38 mmol/L — ABNORMAL HIGH (ref 22–32)
Calcium: 9.2 mg/dL (ref 8.9–10.3)
Chloride: 84 mmol/L — ABNORMAL LOW (ref 98–111)
Creatinine, Ser: 1.72 mg/dL — ABNORMAL HIGH (ref 0.61–1.24)
GFR, Estimated: 40 mL/min — ABNORMAL LOW (ref >60)
Glucose, Bld: 79 mg/dL (ref 70–99)
Potassium: 3.8 mmol/L (ref 3.5–5.1)
Sodium: 132 mmol/L — ABNORMAL LOW (ref 135–145)

## 2023-02-11 MED ORDER — IPRATROPIUM-ALBUTEROL 0.5-2.5 (3) MG/3ML IN SOLN
3.0000 mL | Freq: Two times a day (BID) | RESPIRATORY_TRACT | Status: DC
  Administered 2023-02-11 – 2023-02-12 (×2): 3 mL via RESPIRATORY_TRACT
  Filled 2023-02-11 (×2): qty 3

## 2023-02-11 NOTE — Assessment & Plan Note (Addendum)
Multifactorial, including respiratory and heart failure, now has resolved.  Patient very weak and deconditioned, he has declined SNF. Will arrange home health services.

## 2023-02-11 NOTE — Assessment & Plan Note (Addendum)
OSA.  Acute on chronic hypercapnic and hypoxemic respiratory failure.   Patient had diuresis with improvement in his respiratory failure.  Patient will continue home Bipap and supplemental -02 per [place].  Follow up as outpatient.

## 2023-02-11 NOTE — Hospital Course (Signed)
Mr. Wolgamott was admitted to the hospital with the working diagnosis of acute on chronic heart failure decompensation.   77/M with history of severe OSA, right heart failure, chronic venous insufficiency, cor pulmonale, untreated OSA OHS, chronic respiratory failure on home O2 recurrent DVT, persistent A-fib presented to the ED with fatigue, dyspnea on exertion and fall. Apparently patient has progressive fatigue at home for over 14 days. He had worsening dyspnea with increased 02 requirements, positive hallucinations, prompting his family to call EMS. In the ED he was in respiratory distress and he was placed on Bipap, his blood pressure was 112/68, HR 63, RR 20 and 02 saturation 97%. Lungs with no wheezing or rhonchi, heart with S1 and S2 present and bradycardic, irregular, abdomen with no distention, and positive lower extremity edema. Bilateral lower extremity ulcerated wounds.   ABG 7.34/ 85.7/ 99/ 49/ 98% ABG 7.30/ 94.8/ 87/ 98.5%  Na 133, K 5,5, Cl 80, bicarbonate 37, bun 13 cr 1,48 AST 53, ALT 17  High sensitive troponin 35 and 28  Wbc 5,3 hgb 16,8 plt 149  D dimer 0,63  TSH 83.7 and free T 4 ,0.25   Chest radiograph with hypoinflation, poor penetration due to body habitus, cardiomegaly, bilateral hilar vascular congestion.  Head CT with no acute changes.   EKG 50 bpm, right axis deviation, interventricular conduction delay, junctional rhythm with no significant ST segment or T wave changes.   EKG 54 bpm, right axis deviation, interventricular conduction delay, sinus rhythm with 1st degree AV block, no significant ST segment changes, negative T wave V1 and V2.   -Admitted, placed on BiPAP and diuretics, subsequently held with drop in blood pressure  07/09 transitioned to oral diuretic therapy.  07/10 patient with improved volume status, home Bipap has been arranged. Plan to follow up as outpatient, home health services.

## 2023-02-11 NOTE — Assessment & Plan Note (Signed)
Calculated BMI 99.7

## 2023-02-11 NOTE — Assessment & Plan Note (Addendum)
AKI, Hyponatremia. Hyperkalemia   Renal function with serum cr at 1,72 with K at 3,8 and serum bicarbonate at 38. Na 132.   Plan to continue diuresis with bumetanide.  Follow up renal function as outpatient.

## 2023-02-11 NOTE — Assessment & Plan Note (Signed)
Peripheral vascular disease and history of DVT. Continue local wound care and anticoagulation. Follow up as outpatient.

## 2023-02-11 NOTE — Progress Notes (Signed)
Progress Note   Patient: Shane Diaz VHQ:[id number] DOB: [birth year] DOA: 02/06/2023     5 DOS: the patient was seen and examined on 02/11/2023   Brief hospital course: 77/M with history of severe OSA, right heart failure, chronic venous insufficiency, cor pulmonale, untreated OSA OHS, chronic respiratory failure on home O2 recurrent DVT, persistent A-fib presented to the ED with fatigue, dyspnea on exertion and fall. -Not very compliant with diuretics, has not been started on CPAP for severe sleep apnea -In the ED noted to have 1+ edema, hypoxic placed on 5 L O2, chest x-ray noted cardiomegaly, pulmonary vascular congestion and small pleural effusion, ABG noted hypercarbia, BNP 174, troponin 35, TSH 83 and free T4 less than 0.25 -Admitted, placed on BiPAP and diuretics, subsequently held with drop in blood pressure  07/09 transitioned to oral diuretic therapy.   Assessment and Plan: * Acute on chronic diastolic CHF (congestive heart failure) (HCC) Echocardiogram with preserved LV systolic function EF 65 to 70%, mild asymmetric left ventricular hypertrophy of the basal segment. RV systolic function with mild reduction. No significant valvular disease.   Pulmonary hypertension. Acute on chronic core pulmonale.   Patient has been diuresed with furosemide, his fluid balance is negative -4,372 ml, since admission.  Systolic blood pressure 102 to 106 mmHg.   Plan to continue diuresis with bumetanide. Blood pressure support with midodrine.   Acute hypoxic on chronic hypercapnic respiratory failure (HCC) OSA.   Improved ventilation, considering his hypercapnic respiratory failure, he will benefit from Bipap at home. Continue supplemental 02 per [place].    Acute metabolic encephalopathy Multifactorial, including respiratory and heart failure, now has resolved.  Patient very weak and deconditioned, he has declined SNF. Will arrange home health services.   Atrial fibrillation, chronic  (HCC) Patient has been on sinus rhythm, no further ectopy or arrhythmia on telemetry monitor. Considering uncontrolled hypothyroidism, will continue to hold on amiodarone.   CKD stage 3b, GFR 30-44 ml/min (HCC) AKI, Hyponatremia.   Renal function with serum cr at 1,72 with K at 3,8 and serum bicarbonate at 38. Na 132.   Plan to continue diuresis with bumetanide.  Follow up renal function as outpatient.   Hypothyroidism Continue with levothyroxine.   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 Continue local wound care and anticoagulation. Follow up as outpatient.   Obesity, Class III, BMI 40-49.9 (morbid obesity) (HCC) Calculated BMI 99.7         Subjective: patient is feeling better, dyspnea has improved, no PND or orthopnea. Edema is back to baseline, he continue very weak and deconditioned.   Physical Exam: Vitals:   02/11/23 0442 02/11/23 0700 02/11/23 0903 02/11/23 1100  BP: 107/66 109/72 (!) 106/58 (!) 141/78  Pulse: (!) 54 (!) 55 66 61  Resp: 20 16 18 20   Temp: 98.4 F (36.9 C) 98.4 F (36.9 C)  98.4 F (36.9 C)  TempSrc: Oral Oral  Oral  SpO2: 93% 94% 92% 94%  Weight: (!) 297.6 kg     Height:       Neurology awake and alert ENT with mild pallor with no icterus Cardiovascular with S1 and S2 present and rhythmic with no gallops, rubs or murmurs No JVD Trace non pitting lower extremity edema. Positive discoloration of lower extremities and distal medial ulcerated wounds, more left than right. Respiratory with no rales or wheezing, no rhonchi Abdomen with no distention  Data Reviewed:    Family Communication: no  family at the bedside   Disposition: Status is: Inpatient Remains inpatient appropriate because: heart failure and respiratory failure, possible discharge tomorrow with Bipap.   Planned Discharge Destination: Home      Author: Coralie Keens,  MD 02/11/2023 2:32 PM  For on call review [email or web address].

## 2023-02-11 NOTE — Progress Notes (Signed)
Rounding Note    Patient Name: Shane Diaz Date of Encounter: 02/11/2023  [facility] Cardiologist: Thurmon Fair, MD   Subjective   No issues overnight - feels better. Used CPAP. Net negative. Rhythm stable, HR in 60's  Inpatient Medications    Scheduled Meds:  apixaban  5 mg Oral BID   bumetanide  2 mg Oral BID   ipratropium-albuterol  3 mL Nebulization BID   levothyroxine  100 mcg Oral Q0600   midodrine  5 mg Oral BID WC   sodium chloride flush  3 mL Intravenous Q12H   Continuous Infusions:  sodium chloride     PRN Meds: sodium chloride, acetaminophen **OR** acetaminophen, albuterol, ondansetron **OR** ondansetron (ZOFRAN) IV, senna-docusate, sodium chloride flush   Vital Signs    Vitals:   02/10/23 2250 02/10/23 2336 02/11/23 0442 02/11/23 0700  BP:  112/70 107/66 109/72  Pulse:  (!) 56 (!) 54 (!) 55  Resp:  16 20 16   Temp:  98.1 F (36.7 C) 98.4 F (36.9 C) 98.4 F (36.9 C)  TempSrc:  Oral Oral Oral  SpO2: 95% 93% 93% 94%  Weight:   (!) 297.6 kg   Height:        Intake/Output Summary (Last 24 hours) at 02/11/2023 0901 Last data filed at 02/10/2023 2338 Gross per 24 hour  Intake 0 ml  Output 800 ml  Net -800 ml      02/11/2023    4:42 AM 02/10/2023    4:21 AM 02/08/2023    4:01 AM  Last 3 Weights  Weight (lbs) 656 lb 1.4 oz 294 lb 15.6 oz 295 lb 9.6 oz  Weight (kg) 297.6 kg 133.8 kg 134.083 kg      Telemetry    Sinus rhythm with episodic slow wide complex rhythm- Personally Reviewed  Physical Exam   GEN: No acute distress.  Obese Neck: No JVD Cardiac: RRR Respiratory: Clear to auscultation bilaterally. GI: Soft, nontender, non-distended  MS: no edema; chronic skin changes Neuro:  Nonfocal  Psych: Normal affect   Labs    High Sensitivity Troponin:   Recent Labs  Lab 02/06/23 2045 02/06/23 2206  TROPONINIHS 35* 28*     Chemistry Recent Labs  Lab 02/06/23 1434 02/06/23 1505 02/06/23 2045 02/07/23 0600 02/08/23 0105  02/09/23 1036 02/10/23 0051 02/11/23 0049  NA 132*   < >  --  133*   < > 132* 130* 132*  K 4.6   < >  --  5.5*   < > 3.9 3.4* 3.8  CL 80*  --   --  80*   < > 81* 80* 84*  CO2 42*  --   --  37*   < > 36* 39* 38*  GLUCOSE 90  --   --  65*   < > 134* 90 79  BUN 12  --   --  13   < > 15 12 15   CREATININE 1.76*  --   --  1.48*   < > 1.95* 1.84* 1.72*  CALCIUM 8.9  --   --  9.2   < > 9.3 9.2 9.2  MG  --   --  2.1  --   --   --   --   --   PROT 6.5  --   --  6.6  --   --   --   --   ALBUMIN 3.3*  --   --  3.6  --   --   --   --  AST 41  --   --  53*  --   --   --   --   ALT 15  --   --  17  --   --   --   --   ALKPHOS 56  --   --  55  --   --   --   --   BILITOT 1.5*  --   --  2.0*  --   --   --   --   GFRNONAA 39*  --   --  48*   < > 35* 37* 40*  ANIONGAP 10  --   --  16*   < > 15 11 10    < > = values in this interval not displayed.     Hematology Recent Labs  Lab 02/08/23 0105 02/09/23 0104 02/10/23 0051  WBC 5.3 5.9 10.1  RBC 4.90 4.85 4.68  HGB 16.6 16.3 15.8  HCT 51.9 51.3 49.0  MCV 105.9* 105.8* 104.7*  MCH 33.9 33.6 33.8  MCHC 32.0 31.8 32.2  RDW 15.9* 16.0* 15.8*  PLT 147* 150 148*   Thyroid  Recent Labs  Lab 02/06/23 1705 02/06/23 2045  TSH 83.727*  --   FREET4  --  <0.25*    BNP Recent Labs  Lab 02/06/23 1705  BNP 174.1*    DDimer  Recent Labs  Lab 02/06/23 2045  DDIMER 0.63*     Radiology    No results found.    Patient Profile     77 year old male with past medical history of severe obstructive sleep apnea, right heart failure felt secondary to obesity hypoventilation/OSA, paroxysmal atrial fibrillation/flutter, hypertension for evaluation of acute on chronic right-sided heart failure.  Patient has chronic dyspnea on exertion.  He states that is unchanged.  He becomes dyspneic with walking 10 feet.  He denies orthopnea or PND.  He has chronic pedal edema but states this is actually improved compared to previous.  He has not had chest pain,  palpitations or syncope.  He complains predominantly of lack of energy and fatigue.  He was admitted and cardiology asked to evaluate.  Chest x-ray shows vascular congestion and small pleural effusions.  Creatinine 1.48, BUN 13, BNP 174, troponin 35 and 28, hemoglobin 16.8, TSH 83.727, free T4 less than 0.25.  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 Creatinine further improved to 1.72.   2 hypothyroidism-TSH is markedly elevated  at 83 and free T4 less than 0.25.  This is likely the cause of his generalized weakness and fatigue.  Continue levothyroxine.   3 paroxysmal atrial fibrillation/flutter-he is in sinus rhythm.  I am concerned that amiodarone is likely causing his hypothyroidism.  Note TSH July 02, 2022 7.820.  Free T4 was normal.  Amiodarone on hold. CHA2DS2-VASc 4.  Patient is presently on IV heparin.  Will transition back to apixaban at discharged. Has had bradycardia with 2:1 AVB and slow wide=complex rhythms while here, but asymptomatic.   4 obstructive sleep apnea-needs to be compliant with BIPAP at home. Ordered and fitted for BIPAP here, he said he would use it at night or with naps.   5 obesity hypoventilation syndrome-needs weight loss.   No further suggestions at this time. Will sign-off.   [facility] will sign off.   Medication Recommendations:  as above Other recommendations (labs, testing, etc):  none Follow up as an outpatient:  Dr. Royann Shivers or APP   For questions or updates, please contact [facility] Please consult [email or web address] for contact info under   Chrystie Nose, MD, Milagros Loll  [place]  [facility]  Medical Director of the [facility]  [facility] Diplomate of the American  Board of Clinical Lipidology Attending Cardiologist  Direct Dial: [phone]  Fax: [phone]  Website:  [email or web address]  Chrystie Nose, MD  02/11/2023, 9:01 AM

## 2023-02-11 NOTE — Assessment & Plan Note (Addendum)
Patient has been on sinus rhythm, no further ectopy or arrhythmia on telemetry monitor. Considering uncontrolled hypothyroidism, will continue to hold on amiodarone.  Continue anticoagulation with apixaban.

## 2023-02-11 NOTE — Assessment & Plan Note (Addendum)
Echocardiogram with preserved LV systolic function EF 65 to 70%, mild asymmetric left ventricular hypertrophy of the basal segment. RV systolic function with mild reduction. No significant valvular disease.   Pulmonary hypertension. Acute on chronic core pulmonale.   Patient has been diuresed with furosemide, his fluid balance is negative -5,882 ml, since admission.  Systolic blood pressure 1116 to 117 mmHg.   Plan to continue diuresis with bumetanide. Blood pressure support with midodrine.  Limited pharmacologic, no RAAS inhibition due to risk of hypotension and worsening GFR. No B blocker due to bradycardia. No SGLT 2 inh due to poor mobility, obesity and risk of groin infections.

## 2023-02-11 NOTE — Assessment & Plan Note (Signed)
Continue with levothyroxine.   Will need follow up thyroid function testing in 2 to 3 weeks as outpatient.

## 2023-02-11 NOTE — Progress Notes (Signed)
Mobility Specialist Progress Note:   02/11/23 1100  Mobility  Activity Stood at bedside (+ lateral steps toward [facility])  Level of Assistance Moderate assist, patient does 50-74% (+2)  Assistive Device Front wheel walker  Distance Ambulated (ft) 3 ft  Activity Response Tolerated fair  Mobility Referral Yes  $Mobility charge 1 Mobility  Mobility Specialist Start Time (ACUTE ONLY) 1055  Mobility Specialist Stop Time (ACUTE ONLY) 1115  Mobility Specialist Time Calculation (min) (ACUTE ONLY) 20 min   Post Mobility: HR 86bpm; O2 90% 4LO2; BP 141/78 (96)  Pt agreeable to mobility session with encouragement. Pt believes he needs to "start slow-like massaging muscles". Pt agreed to attempt to stand, which required heavy modA+2 at first before pt giving full effort. Able to take lateral steps toward [facility] with minA+2. Pt became dizzy and declined further mobility. Back in bed with all needs met, bed alarm on.   Addison Lank Mobility Specialist Please contact via SecureChat or  Rehab office at [phone]

## 2023-02-11 NOTE — Progress Notes (Signed)
   Heart Failure Stewardship Pharmacist Progress Note   PCP: Ralene Ok, MD PCP-Cardiologist: Thurmon Fair, MD    HPI:  77 yo M with PMH of afib, obesity hypoventilation syndrome, CHF, OSA complicated by chronic cor pulmonale and right sided failure, severe venous insufficiency, PVD, and VTE.   Presented to the ED on 7/5 with shortness of breath, weakness, AMS, and visual hallucinations. Reported increased oxygen requirements at home, dietary noncompliance, and has not been wearing his CPAP at night. Reports compliance with medications. CXR with cardiomegaly, vascular congestion, and small effusion. CTA negative for PE. BNP 174. TSH 83 and free T4 < 025, thought to be due from amiodarone. Started on levothyroxine and given IV diuretics. ECHO 7/7 showed LVEF 65-70%, no regional wall motion abnormalities, mild asymmetric LVH, G2DD, RV mildly reduced.   Current HF Medications: Diuretic: bumex 2 mg PO BID *also on midodrine 5 mg BID  Prior to admission HF Medications: Diuretic: bumex 2 mg BID Beta blocker: metoprolol XL 50 mg daily MRA: eplerenone 25 mg daily  Pertinent Lab Values: Serum creatinine 1.72, BUN 15, Potassium 3.8, Sodium 132, BNP 174.1, Magnesium 2.1   Vital Signs: Weight: 297 lbs (admission weight: 303 lbs) Blood pressure: 100-110/60s  Heart rate: 50-60s  I/O: net -4.4L since admission  Medication Assistance / Insurance Benefits Check: Does the patient have prescription insurance?  Yes Type of insurance plan: [facility] Medicare  Outpatient Pharmacy:  Prior to admission outpatient pharmacy[facility] Is the patient willing to use [facility] TOC pharmacy at discharge? Yes Is the patient willing to transition their outpatient pharmacy to utilize a [facility] outpatient pharmacy?   No    Assessment: 1. Acute on chronic diastolic CHF (LVEF 65-70%). NYHA class II symptoms. - Continue bumex 2 mg PO BID. Strict I/Os and daily weights. Keep K>4 and Mg>2. - Holding PTA  metoprolol with bradycardia - BP low requiring midodrine. - Caution SGLT2i with body habitus   Plan: 1) Medication changes recommended at this time: - Continue current regimen  2) Patient assistance: - None pending  3)  Education  - Patient has been educated on current HF medications and potential additions to HF medication regimen - Patient verbalizes understanding that over the next few months, these medication doses may change and more medications may be added to optimize HF regimen - Patient has been educated on basic disease state pathophysiology and goals of therapy   Sharen Hones, PharmD, BCPS Heart Failure Stewardship Pharmacist Phone [phone]

## 2023-02-12 ENCOUNTER — Other Ambulatory Visit (HOSPITAL_COMMUNITY): Payer: Self-pay

## 2023-02-12 DIAGNOSIS — G9341 Metabolic encephalopathy: Secondary | ICD-10-CM | POA: Diagnosis not present

## 2023-02-12 DIAGNOSIS — J9601 Acute respiratory failure with hypoxia: Secondary | ICD-10-CM | POA: Diagnosis not present

## 2023-02-12 DIAGNOSIS — I482 Chronic atrial fibrillation, unspecified: Secondary | ICD-10-CM | POA: Diagnosis not present

## 2023-02-12 DIAGNOSIS — I5033 Acute on chronic diastolic (congestive) heart failure: Secondary | ICD-10-CM | POA: Diagnosis not present

## 2023-02-12 DIAGNOSIS — J9602 Acute respiratory failure with hypercapnia: Secondary | ICD-10-CM

## 2023-02-12 MED ORDER — LEVOTHYROXINE SODIUM 100 MCG PO TABS
100.0000 ug | ORAL_TABLET | Freq: Every day | ORAL | 0 refills | Status: AC
  Filled 2023-02-12: qty 30, 30d supply, fill #0

## 2023-02-12 MED ORDER — MIDODRINE HCL 5 MG PO TABS
5.0000 mg | ORAL_TABLET | Freq: Two times a day (BID) | ORAL | 0 refills | Status: AC
  Filled 2023-02-12: qty 60, 30d supply, fill #0

## 2023-02-12 NOTE — Plan of Care (Signed)
  Problem: Education: Goal: Knowledge of General Education information will improve Description: Including pain rating scale, medication(s)/side effects and non-pharmacologic comfort measures 02/12/2023 0446 by Colon Flattery, RN Outcome: Progressing 02/11/2023 2322 by Colon Flattery, RN Outcome: Progressing   Problem: Education: Goal: Ability to demonstrate management of disease process will improve 02/12/2023 0446 by Colon Flattery, RN Outcome: Progressing 02/11/2023 2322 by Colon Flattery, RN Outcome: Progressing   Problem: Cardiac: Goal: Ability to achieve and maintain adequate cardiopulmonary perfusion will improve 02/12/2023 0446 by Colon Flattery, RN Outcome: Progressing 02/11/2023 2322 by Colon Flattery, RN Outcome: Progressing

## 2023-02-12 NOTE — Progress Notes (Signed)
   Heart Failure Stewardship Pharmacist Progress Note   PCP: Ralene Ok, MD PCP-Cardiologist: Thurmon Fair, MD    HPI:  77 yo M with PMH of afib, obesity hypoventilation syndrome, CHF, OSA complicated by chronic cor pulmonale and right sided failure, severe venous insufficiency, PVD, and VTE.   Presented to the ED on 7/5 with shortness of breath, weakness, AMS, and visual hallucinations. Reported increased oxygen requirements at home, dietary noncompliance, and has not been wearing his CPAP at night. Reports compliance with medications. CXR with cardiomegaly, vascular congestion, and small effusion. CTA negative for PE. BNP 174. TSH 83 and free T4 < 025, thought to be due from amiodarone. Started on levothyroxine and given IV diuretics. ECHO 7/7 showed LVEF 65-70%, no regional wall motion abnormalities, mild asymmetric LVH, G2DD, RV mildly reduced.   Discharge HF Medications: Diuretic: bumex 2 mg PO BID *also on midodrine 5 mg BID  Prior to admission HF Medications: Diuretic: bumex 2 mg BID Beta blocker: metoprolol XL 50 mg daily MRA: eplerenone 25 mg daily  Pertinent Lab Values: Serum creatinine 1.72, BUN 15, Potassium 3.8, Sodium 132, BNP 174.1, Magnesium 2.1   Vital Signs: Weight: 294 lbs (admission weight: 303 lbs) Blood pressure: 110/60s  Heart rate: 50-60s  I/O: net -5.9L since admission  Medication Assistance / Insurance Benefits Check: Does the patient have prescription insurance?  Yes Type of insurance plan: [facility] Medicare  Outpatient Pharmacy:  [facility] to admission [facility] pharmacy[facility] Is the patient willing to use [facility] TOC pharmacy at discharge? Yes Is the patient willing to transition their outpatient pharmacy to utilize a [facility] outpatient pharmacy?   No    Assessment: 1. Acute on chronic diastolic CHF (LVEF 65-70%). NYHA class II symptoms. - Continue bumex 2 mg PO BID. Strict I/Os and daily weights. Keep K>4 and Mg>2. - Holding PTA metoprolol  with bradycardia - BP low requiring midodrine. May be able to discontinue at follow up. - Caution SGLT2i with body habitus   Plan: 1) Medication changes recommended at this time: - Continue current regimen, discharge today. May be able to discontinue midodrine at follow up.  2) Patient assistance: - None pending  3)  Education  - Patient has been educated on current HF medications and potential additions to HF medication regimen - Patient verbalizes understanding that over the next few months, these medication doses may change and more medications may be added to optimize HF regimen - Patient has been educated on basic disease state pathophysiology and goals of therapy   Sharen Hones, PharmD, BCPS Heart Failure Stewardship Pharmacist Phone [phone]

## 2023-02-12 NOTE — TOC Transition Note (Addendum)
Transition of Care [facility]) - CM/SW Discharge Note   Patient Details  Name: Shane Diaz MRN: [id number] Date of Birth: 02-28-1946  Transition of Care [facility]) CM/SW Contact:  Leone Haven, RN Phone Number: 02/12/2023, 11:43 AM   Clinical Narrative:    Patient is for dc today, his ride will be here at 3 pm,  [employer] to set up bipap with respiratory at patient's home, confirmed with patient TOC to fill meds.     Barriers to Discharge: Continued Medical Work up   Patient Goals and CMS Choice      Discharge Placement                         Discharge Plan and Services Additional resources added to the After Visit Summary for   In-house Referral: Clinical Social Work                                   Social Determinants of Health (SDOH) Interventions SDOH Screenings   Food Insecurity: No Food Insecurity (02/07/2023)  Housing: Low Risk  (02/07/2023)  Transportation Needs: No Transportation Needs (02/07/2023)  Utilities: Not At Risk (02/07/2023)  Alcohol Screen: Low Risk  (02/09/2023)  Financial Resource Strain: Low Risk  (02/09/2023)  Tobacco Use: Low Risk  (02/09/2023)     Readmission Risk Interventions     No data to display

## 2023-02-12 NOTE — Discharge Summary (Addendum)
Physician Discharge Summary   Patient: Shane Diaz MRN: [id number] DOB: July 16, 1946  Admit date:     02/06/2023  Discharge date: 02/12/23  Discharge Physician: [name] Wilberto Console   PCP: Ralene Ok, MD   Recommendations at discharge:    Continue diuresis with bumetanide. Discontinue metoprolol due to bradycardia. Discontinue amiodarone due to hypothyroidism. Holding on eplerenone due to risk of hyperkalemia.  Added midodrine for blood pressure support.  Patient will continue home Bipap (non invasive mechanical ventilation at home).  Patient placed on Levothyroxine, follow up thyroid function testing in 2 to 3 weeks. Follow up renal function in 7 days as outpatient.  Follow up with Dr Ludwig Clarks in 7 to 10 days.  Follow up with Heart Failure as scheduled.   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 [facility] Course: Shane. Diaz was admitted to the hospital with the working diagnosis of acute on chronic heart failure decompensation.   77/M with history of severe OSA, right heart failure, chronic venous insufficiency, cor pulmonale, untreated OSA OHS, chronic respiratory failure on home O2 recurrent DVT, persistent A-fib presented to the ED with fatigue, dyspnea on exertion and fall. Apparently patient has progressive fatigue at home for over 14 days. He had worsening dyspnea with increased 02 requirements, positive hallucinations, prompting his family to call EMS. In the ED he was in respiratory distress and he was placed on  Bipap, his blood pressure was 112/68, HR 63, RR 20 and 02 saturation 97%. Lungs with no wheezing or rhonchi, heart with S1 and S2 present and bradycardic, irregular, abdomen with no distention, and positive lower extremity edema. Bilateral lower extremity ulcerated wounds.   ABG 7.34/ 85.7/ 99/ 49/ 98% ABG 7.30/ 94.8/ 87/ 98.5%  Na 133, K 5,5, Cl 80, bicarbonate 37, bun 13 cr 1,48 AST 53, ALT 17  High sensitive troponin 35 and 28  Wbc 5,3 hgb 16,8 plt 149  D dimer 0,63  TSH 83.7 and free T 4 ,0.25   Chest radiograph with hypoinflation, poor penetration due to body habitus, cardiomegaly, bilateral hilar vascular congestion.  Head CT with no acute changes.   EKG 50 bpm, right axis deviation, interventricular conduction delay, junctional rhythm with no significant ST segment or T wave changes.   EKG 54 bpm, right axis deviation, interventricular conduction delay, sinus rhythm with 1st degree AV block, no significant ST segment changes, negative T wave V1 and V2.   -Admitted, placed on BiPAP and diuretics, subsequently held with drop in blood pressure  07/09 transitioned to oral diuretic therapy.  07/10 patient with improved volume status, home Bipap has been arranged. Plan to follow up as outpatient, home health services.   Assessment and Plan: * Acute on chronic diastolic CHF (congestive heart failure) (HCC) Echocardiogram with preserved LV systolic function EF 65 to 70%, mild asymmetric left ventricular hypertrophy of the basal segment. RV systolic function with mild reduction. No significant valvular disease.   Pulmonary hypertension. Acute on chronic core pulmonale.   Patient has been diuresed with furosemide, his fluid balance is negative -5,882 ml,  since admission.  Systolic blood pressure 1116 to 117 mmHg.   Plan to continue diuresis with bumetanide. Blood pressure support with midodrine.  Limited pharmacologic, no RAAS inhibition due to risk of hypotension and worsening  GFR. No B blocker due to bradycardia. No SGLT 2 inh due to poor mobility, obesity and risk of groin infections.   Acute respiratory failure with hypoxia and hypercapnia (HCC) OSA.  Acute on chronic hypercapnic and hypoxemic respiratory failure.   Patient had diuresis with improvement in his respiratory failure.  Patient will continue home Bipap and supplemental -02 per [place].  Follow up as outpatient.    Acute metabolic encephalopathy Multifactorial, including respiratory and heart failure, now has resolved.  Patient very weak and deconditioned, he has declined SNF. Will arrange home health services.   Atrial fibrillation, chronic (HCC) Patient has been on sinus rhythm, no further ectopy or arrhythmia on telemetry monitor. Considering uncontrolled hypothyroidism, will continue to hold on amiodarone.  Continue anticoagulation with apixaban.   CKD stage 3b, GFR 30-44 ml/min (HCC) AKI, Hyponatremia. Hyperkalemia   Renal function with serum cr at 1,72 with K at 3,8 and serum bicarbonate at 38. Na 132.   Plan to continue diuresis with bumetanide.  Follow up renal function as outpatient.   Hypothyroidism Continue with levothyroxine.   Will need follow up thyroid function testing in 2 to 3 weeks as outpatient.   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 Continue local wound care and anticoagulation. Follow up as outpatient.   Obesity, Class III, BMI 40-49.9 (morbid obesity) (HCC) Calculated BMI 99.7    Consultants: cardiology  Procedures performed: none   Disposition: Home Diet recommendation:  Discharge Diet Orders (From admission, onward)     Start     Ordered   02/12/23 0000  Diet - low sodium heart healthy        02/12/23 1100           Cardiac diet DISCHARGE MEDICATION: Allergies as of 02/12/2023       Reactions   Diltiazem    rash   Amiodarone Rash   Amiodarone DC'd  02/07/2023 due to concern causing hypothyroidism.         Medication List     STOP taking these medications    amiodarone 200 MG tablet Commonly known as: PACERONE   eplerenone 25 MG tablet Commonly known as: INSPRA   metoprolol succinate 50 MG 24 hr tablet Commonly known as: TOPROL-XL       TAKE these medications    apixaban 5 MG Tabs tablet Commonly known as: Eliquis Take 1 tablet (5 mg total) by mouth 2 (two) times daily.   bumetanide 2 MG tablet Commonly known as: BUMEX TAKE 2 TABLETS BY MOUTH TWICE A DAY   levothyroxine 100 MCG tablet Commonly known as: SYNTHROID Take 1 tablet (100 mcg total) by mouth daily at 6 (six) AM. Start taking on: February 13, 2023   midodrine 5 MG tablet Commonly known as: PROAMATINE Take 1 tablet (5 mg total) by mouth 2 (two) times daily with a meal.   nitroGLYCERIN 0.4 MG SL tablet Commonly known as: NITROSTAT Place 1 tablet (0.4 mg total) under the tongue every 5 (five) minutes x 3 doses as needed for chest pain.               Durable Medical Equipment  (From admission, onward)           Start  Ordered   02/11/23 1437  For home use only DME Bipap  Once       Question:  Length of Need  Answer:  12 Months   02/11/23 1436   02/10/23 1406  For home use only DME continuous positive airway pressure (CPAP)  Once       Comments: 4-20  Question Answer Comment  Length of Need Lifetime   Patient has OSA or probable OSA Yes   Is the patient currently using CPAP in the home No   Settings Other see comments   Signs and symptoms of probable OSA  (select all that apply) Snoring   CPAP supplies needed Mask, headgear, cushions, filters, heated tubing and water chamber      02/10/23 1406              Discharge Care Instructions  (From admission, onward)           Start     Ordered   02/12/23 0000  Discharge wound care:       Comments: L anterior lower leg ulcer cover with foam to protect area. Lift foam daily to  assess area. Change foam dressing q3 days and prn soiling.  2.         R anterior lower leg wound clean with NS, apply small piece of Silver Hydrofiber [place] [phone]) to wound bed daily and cover with silicone foam. May lift foam daily to replace Silver.  MOISTEN SILVER WITH NS IF STUCK TO WOUND BED AT REMOVAL.  Change foam dressing q3 days and prn soiling   02/12/23 1100            Follow-up Information     [facility]. Go in 9 day(s).   Specialty: Cardiology Why: Hospital follow up 02/20/2023 @ 12 noon PLEASE bring a current medication list to appointment FREE valet parking, Entrance C, off [employer] information: [address] [id number] mc [place] [address] [phone]        [facility] Follow up.   Why: cpap Contact information: [phone]        [facility] Follow up.   Specialty: Home Health Services Contact information: [address] [place] [address] [place] [address] [phone]                Discharge Exam: Ceasar Mons Weights   02/10/23 0421 02/11/23 0442 02/12/23 0010  Weight: 133.8 kg (!) 297.6 kg (!) 294.2 kg   BP 117/65 (BP Location: Right Arm)   Pulse (!) 58   Temp 97.8 F (36.6 C) (Oral)   Resp 17   Ht 5\' 8"  (1.727 m)   Wt (!) 294.2 kg   SpO2 95%   BMI 98.62 kg/m   Patient with no chest pain or dyspnea, edema has improved.   Neurology awake and alert ENT with mild pallor Cardiovascular with S1 and S2 present and regular with no gallops, or rubs, positive systolic murmur at the right lower sternal border. Respiratory with no rales or wheezing, no rhonchi Abdomen with no distention  Lower extremity with + non pitting edema, positive discoloration and bilateral distal medial ulcerated wounds with dressing in place.   Condition at discharge: stable  The results of significant diagnostics from this hospitalization (including imaging, microbiology,  ancillary and laboratory) are listed below for reference.   Imaging Studies: ECHOCARDIOGRAM COMPLETE  Result Date: 02/08/2023    ECHOCARDIOGRAM REPORT   Patient Name:  Manfred Arch Date of Exam: 02/08/2023 Medical Rec #:  [id number]     Height:       68.0 in Accession #:    [id number]    Weight:       295.6 lb Date of Birth:  1945/09/27     BSA:          2.413 m Patient Age:    77 years      BP:           104/55 mmHg Patient Gender: M             HR:           56 bpm. Exam Location:  Inpatient Procedure: 2D Echo, Cardiac Doppler, Color Doppler and Intracardiac            Opacification Agent Indications:    CHF-Acute Diastolic I50.31  History:        Patient has prior history of Echocardiogram examinations, most                 recent 03/19/2021. CHF, Arrythmias:Atrial Fibrillation and Atrial                 Flutter; Risk Factors:Hypertension and Sleep Apnea. CKD, stage                 3.  Sonographer:    Lucendia Herrlich Referring Phys: [id number] SUBRINA SUNDIL  Sonographer Comments: Technically difficult study due to poor echo windows, suboptimal apical window, suboptimal subcostal window and patient is obese. IMPRESSIONS  1. Left ventricular ejection fraction, by estimation, is 65 to 70%. The left ventricle has normal function. The left ventricle has no regional wall motion abnormalities. There is mild asymmetric left ventricular hypertrophy of the basal segment. Left ventricular diastolic parameters are consistent with Grade II diastolic dysfunction (pseudonormalization).  2. Right ventricular systolic function is mildly reduced. The right ventricular size is normal. Tricuspid regurgitation signal is inadequate for assessing PA pressure.  3. Left atrial size was upper normal.  4. The mitral valve is grossly normal. Trivial mitral valve regurgitation.  5. The aortic valve is tricuspid. There is mild calcification of the aortic valve. Aortic valve regurgitation is not visualized. Aortic valve sclerosis is present,  with no evidence of aortic valve stenosis.  6. The inferior vena cava is normal in size with greater than 50% respiratory variability, suggesting right atrial pressure of 3 mmHg. Comparison(s): Prior images reviewed side by side. LVEF vigorous at 65-70% with moderate diastolic dysfunction. FINDINGS  Left Ventricle: Left ventricular ejection fraction, by estimation, is 65 to 70%. The left ventricle has normal function. The left ventricle has no regional wall motion abnormalities. Definity contrast agent was given IV to delineate the left ventricular  endocardial borders. The left ventricular internal cavity size was normal in size. There is mild asymmetric left ventricular hypertrophy of the basal segment. Left ventricular diastolic parameters are consistent with Grade II diastolic dysfunction (pseudonormalization). Right Ventricle: The right ventricular size is normal. No increase in right ventricular wall thickness. Right ventricular systolic function is mildly reduced. Tricuspid regurgitation signal is inadequate for assessing PA pressure. The tricuspid regurgitant velocity is 0.81 m/s, and with an assumed right atrial pressure of 3 mmHg, the estimated right ventricular systolic pressure is 5.6 mmHg. Left Atrium: Left atrial size was upper normal. Right Atrium: Right atrial size was normal in size. Pericardium: There is no evidence of pericardial effusion. Presence of epicardial fat layer. Mitral Valve: The mitral valve is grossly  normal. Trivial mitral valve regurgitation. Tricuspid Valve: The tricuspid valve is grossly normal. Tricuspid valve regurgitation is trivial. Aortic Valve: The aortic valve is tricuspid. There is mild calcification of the aortic valve. There is mild aortic valve annular calcification. Aortic valve regurgitation is not visualized. Aortic valve sclerosis is present, with no evidence of aortic valve stenosis. Aortic valve peak gradient measures 5.2 mmHg. Pulmonic Valve: The pulmonic valve  was grossly normal. Pulmonic valve regurgitation is trivial. Aorta: The aortic root is normal in size and structure. Venous: The inferior vena cava is normal in size with greater than 50% respiratory variability, suggesting right atrial pressure of 3 mmHg. IAS/Shunts: No atrial level shunt detected by color flow Doppler.  LEFT VENTRICLE PLAX 2D LVIDd:         4.90 cm   Diastology LVIDs:         3.40 cm   LV e' medial:    5.26 cm/s LV PW:         0.80 cm   LV E/e' medial:  13.3 LV IVS:        1.20 cm   LV e' lateral:   5.59 cm/s LVOT diam:     2.00 cm   LV E/e' lateral: 12.5 LV SV:         53 LV SV Index:   22 LVOT Area:     3.14 cm  RIGHT VENTRICLE            IVC RV S prime:     7.70 cm/s  IVC diam: 1.60 cm LEFT ATRIUM           Index        RIGHT ATRIUM           Index LA diam:      3.60 cm 1.49 cm/m   RA Area:     21.80 cm LA Vol (A4C): 79.9 ml 33.11 ml/m  RA Volume:   59.60 ml  24.70 ml/m  AORTIC VALVE AV Area (Vmax): 2.33 cm AV Vmax:        114.00 cm/s AV Peak Grad:   5.2 mmHg LVOT Vmax:      84.40 cm/s LVOT Vmean:     54.300 cm/s LVOT VTI:       0.169 m  AORTA Ao Root diam: 3.60 cm Ao Asc diam:  3.50 cm MITRAL VALVE               TRICUSPID VALVE MV Area (PHT): 2.91 cm    TR Peak grad:   2.6 mmHg MV Decel Time: 261 msec    TR Vmax:        80.80 cm/s MV E velocity: 70.10 cm/s MV A velocity: 53.90 cm/s  SHUNTS MV E/A ratio:  1.30        Systemic VTI:  0.17 m                            Systemic Diam: 2.00 cm Nona Dell MD Electronically signed by Nona Dell MD Signature Date/Time: 02/08/2023/12:56:47 PM    Final    CT Head Wo Contrast  Result Date: 02/06/2023 CLINICAL DATA:  Mental status change, unknown cause EXAM: CT HEAD WITHOUT CONTRAST TECHNIQUE: Contiguous axial images were obtained from the base of the skull through the vertex without intravenous contrast. RADIATION DOSE REDUCTION: This exam was performed according to the departmental dose-optimization program which includes automated  exposure control, adjustment of the mA and/or  kV according to patient size and/or use of iterative reconstruction technique. COMPARISON:  Head CT 09/24/2020 FINDINGS: Brain: No intracranial hemorrhage, mass effect, or midline shift. Age related atrophy. No hydrocephalus. The basilar cisterns are patent. Moderate periventricular and deep white matter hypodensity typical of chronic small vessel ischemic change. No evidence of territorial infarct or acute ischemia. No extra-axial or intracranial fluid collection. Vascular: Atherosclerosis of skullbase vasculature without hyperdense vessel or abnormal calcification. Skull: No fracture or focal lesion. Sinuses/Orbits: Bilateral cataract resection. Mucosal thickening throughout the left maxillary sinus is new from prior exam. Minimal opacification of lower left mastoid air cells. Other: None. IMPRESSION: 1. No acute intracranial abnormality. 2. 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 3. Left maxillary sinus mucosal thickening, new from prior exam. Recommend correlation for symptoms of sinusitis. Electronically Signed   By: Narda Rutherford M.D.   On: 02/06/2023 16:19   DG Chest 2 View  Result Date: 02/06/2023 CLINICAL DATA:  Shortness of breath EXAM: CHEST - 2 VIEW COMPARISON:  X-ray and CT angiogram 03/18/2021 FINDINGS: Under penetrated radiographs. Motion on lateral view as well. Enlarged cardiopericardial silhouette with vascular congestion and small effusions. No pneumothorax. No obvious consolidation but limited x-rays. Overlapping cardiac leads. IMPRESSION: Limited x-rays. Enlarged heart with vascular congestion and small effusions. Recommend follow-up Electronically Signed   By: Karen Kays M.D.   On: 02/06/2023 15:11   Intravitreal Injection, Pharmacologic Agent - OS - Left Eye  Result Date: 01/23/2023 Time Out 01/23/2023. 10:53 AM. Confirmed correct patient, procedure, site, and patient consented. Anesthesia Topical anesthesia was  used. Anesthetic medications included Lidocaine 2%, Proparacaine 0.5%. Procedure Preparation included 5% betadine to ocular surface, eyelid speculum. A (32g) needle was used. Injection: 2 mg aflibercept 2 MG/0.05ML   Route: Intravitreal, Site: Left Eye   NDC: L6038910, Lot: [id number], Expiration date: 01/02/2024, Waste: 0 mL Post-op Post injection exam found visual acuity of at least counting fingers. The patient tolerated the procedure well. There were no complications. The patient received written and verbal post procedure care education. Post injection medications were not given.   OCT, Retina - OU - Both Eyes  Result Date: 01/23/2023 Right Eye Quality was good. Scan locations included subfoveal. Central Foveal Thickness: 325. Progression has been stable. Findings include no IRF, no SRF, abnormal foveal contour, retinal drusen , epiretinal membrane, macular pucker, pigment epithelial detachment (ERM with blunting of foveal contour, early pucker, partial PVD, focal peripapillary PED ST to disc). Left Eye Quality was good. Scan locations included subfoveal. Central Foveal Thickness: 203. Progression has improved. Findings include no IRF, no SRF, abnormal foveal contour, retinal drusen , subretinal hyper-reflective material, pigment epithelial detachment, vitreomacular adhesion (interval resolution of SRF overlying focal CNV/SRHM ). Notes *Images captured and stored on drive Diagnosis / Impression: OD: NFP; no IRF/SRF; ERM with blunting of foveal contour, early pucker, partial PVD, focal peripapillary PED ST to disc OS: interval resolution of SRF overlying focal CNV/SRHM Clinical management: See below Abbreviations: NFP - Normal foveal profile. CME - cystoid macular edema. PED - pigment epithelial detachment. IRF - intraretinal fluid. SRF - subretinal fluid. EZ - ellipsoid zone. ERM - epiretinal membrane. ORA - outer retinal atrophy. ORT - outer retinal tubulation. SRHM - subretinal hyper-reflective  material. IRHM - intraretinal hyper-reflective material    Microbiology: Results for orders placed or performed during the hospital encounter of 09/13/21  Culture, blood (routine x 2)     Status: None   Collection Time: 09/13/21 10:22 AM   Specimen: BLOOD  Result Value Ref Range Status   Specimen Description   Final    BLOOD LEFT ANTECUBITAL Performed at [facility], [address], [place], [place] [address]    Special Requests   Final    BOTTLES DRAWN AEROBIC AND ANAEROBIC Blood Culture adequate volume Performed at [facility], [address] [place], [place] [address]    Culture   Final    NO GROWTH 5 DAYS Performed at [facility], [address], [place], [place] [address]    Report Status 09/18/2021 FINAL  Final  Culture, blood (routine x 2)     Status: None   Collection Time: 09/13/21 10:40 AM   Specimen: BLOOD  Result Value Ref Range Status   Specimen Description   Final    BLOOD SITE NOT SPECIFIED Performed at [facility], [address] [place], [place] [address]    Special Requests   Final    BOTTLES DRAWN AEROBIC AND ANAEROBIC Blood Culture adequate volume Performed at [facility], [address] [place], [place] [address]    Culture   Final    NO GROWTH 5 DAYS Performed at [facility], [address], [place], [place] [address]    Report Status 09/18/2021 FINAL  Final  Resp Panel by RT-PCR (Flu A&B, Covid) Nasopharyngeal Swab     Status: None   Collection Time: 09/13/21 10:47 AM   Specimen: Nasopharyngeal Swab; Nasopharyngeal(NP) swabs in vial transport medium  Result Value Ref Range Status   SARS Coronavirus 2 by RT PCR NEGATIVE NEGATIVE Final    Comment: (NOTE) SARS-CoV-2 target nucleic acids are NOT DETECTED.  The SARS-CoV-2 RNA is generally detectable in upper respiratory specimens during the acute phase of infection. The lowest concentration of SARS-CoV-2  viral copies this assay can detect is 138 copies/mL. A negative result does not preclude SARS-Cov-2 infection and should not be used as the sole basis for treatment or other patient management decisions. A negative result may occur with  improper specimen collection/handling, submission of specimen other than nasopharyngeal swab, presence of viral mutation(s) within the areas targeted by this assay, and inadequate number of viral copies(<138 copies/mL). A negative result must be combined with clinical observations, patient history, and epidemiological information. The expected result is Negative.  Fact Sheet for Patients:  BloggerCourse.com  Fact Sheet for Healthcare Providers:  SeriousBroker.it  This test is no t yet approved or cleared by the [place] FDA and  has been authorized for detection and/or diagnosis of SARS-CoV-2 by FDA under an Emergency Use Authorization (EUA). This EUA will remain  in effect (meaning this test can be used) for the duration of the COVID-19 declaration under Section 564(b)(1) of the Act, 21 U.S.C.section 360bbb-3(b)(1), unless the authorization is terminated  or revoked sooner.       Influenza A by PCR NEGATIVE NEGATIVE Final   Influenza B by PCR NEGATIVE NEGATIVE Final    Comment: (NOTE) The Xpert Xpress SARS-CoV-2/FLU/RSV plus assay is intended as an aid in the diagnosis of influenza from Nasopharyngeal swab specimens and should not be used as a sole basis for treatment. Nasal washings and aspirates are unacceptable for Xpert Xpress SARS-CoV-2/FLU/RSV testing.  Fact Sheet for Patients: BloggerCourse.com  Fact Sheet for Healthcare Providers: SeriousBroker.it  This test is not yet approved or cleared by the [place] FDA and has been authorized for detection and/or diagnosis of SARS-CoV-2 by FDA under an Emergency Use Authorization  (EUA).  This EUA will remain in effect (meaning this test can be used) for the duration of the COVID-19 declaration under Section 564(b)(1) of the Act, 21 U.S.C. section 360bbb-3(b)(1), unless the authorization is terminated or revoked.  Performed at [facility], [address], [place], [place] [address]     Labs: CBC: Recent Labs  Lab 02/06/23 1434 02/06/23 1505 02/06/23 1954 02/07/23 0054 02/08/23 0105 02/09/23 0104 02/10/23 0051  WBC 4.5  --   --  5.3 5.3 5.9 10.1  NEUTROABS  --   --   --  3.1  --   --   --   HGB 16.0   < > 18.7* 16.8 16.6 16.3 15.8  HCT 51.8   < > 55.0* 52.2* 51.9 51.3 49.0  MCV 107.5*  --   --  106.3* 105.9* 105.8* 104.7*  PLT 142*  --   --  149* 147* 150 148*   < > = values in this interval not displayed.   Basic Metabolic Panel: Recent Labs  Lab 02/06/23 2045 02/07/23 0600 02/08/23 0105 02/09/23 1036 02/10/23 0051 02/11/23 0049  NA  --  133* 131* 132* 130* 132*  K  --  5.5* 3.7 3.9 3.4* 3.8  CL  --  80* 82* 81* 80* 84*  CO2  --  37* 39* 36* 39* 38*  GLUCOSE  --  65* 88 134* 90 79  BUN  --  13 13 15 12 15   CREATININE  --  1.48* 1.64* 1.95* 1.84* 1.72*  CALCIUM  --  9.2 9.0 9.3 9.2 9.2  MG 2.1  --   --   --   --   --    Liver Function Tests: Recent Labs  Lab 02/06/23 1434 02/07/23 0600  AST 41 53*  ALT 15 17  ALKPHOS 56 55  BILITOT 1.5* 2.0*  PROT 6.5 6.6  ALBUMIN 3.3* 3.6   CBG: Recent Labs  Lab 02/06/23 1505 02/09/23 1547  GLUCAP 86 89    Discharge time spent: greater than 30 minutes.  Signed: Coralie Keens, MD [facility] 02/12/2023

## 2023-02-12 NOTE — Progress Notes (Signed)
Physical Therapy Treatment Patient Details Name: Shane Diaz MRN: [id number] DOB: November 05, 1945 Today's Date: 02/12/2023   History of Present Illness The pt is a 77 yo male presenting 7/5 with AMS, visual hallucinations, generalized weakness. Found to have acute CHF exacerbation, also requiring BiPAP due to hypercapnia and hypoxia. PMH includes: morbid obesity, HTN, afib s/p cardioversion x2 in 2021, atrial flutter with AV block, obesity hypoventilation syndrome on 2L O2, OSA, chronic hypoxic resp failure, venous insufficiency, DVT.    PT Comments  Pt received in supine and agreeable to session with encouragement.  Pt able to improve bed mobility requiring min guard, however relies heavily on BUE support on bedrail. Pt able to tolerate a short gait trial in the room, but is limited by DOE and fatigue. Pt's SpO2 noted to drop to 84% on 4L and improve to >90% with a seated rest break and pursed lip breathing. Pt declining further gait and stair trial stating he doesn't want to get more tired. Pt reporting that his wife will help him up the stairs and he will be able to walk the length of his house. Education provided on activity pacing and energy conservation. Pt continues to benefit from PT services to progress toward functional mobility goals.     Assistance Recommended at Discharge Frequent or constant Supervision/Assistance  If plan is discharge home, recommend the following:  Can travel by private vehicle    A lot of help with walking and/or transfers;A lot of help with bathing/dressing/bathroom   Yes  Equipment Recommendations       Recommendations for Other Services       Precautions / Restrictions Precautions Precautions: Fall Precaution Comments: watch O2 Restrictions Weight Bearing Restrictions: No     Mobility  Bed Mobility Overal bed mobility: Needs Assistance Bed Mobility: Supine to Sit     Supine to sit: Min guard, HOB elevated     General bed mobility comments:  use of bedrails and increased time    Transfers Overall transfer level: Needs assistance Equipment used: Rolling walker (2 wheels) Transfers: Sit to/from Stand Sit to Stand: Min assist           General transfer comment: from low EOB with min A for power up    Ambulation/Gait Ambulation/Gait assistance: Min guard Gait Distance (Feet): 15 Feet Assistive device: Rolling walker (2 wheels) Gait Pattern/deviations: Step-through pattern, Decreased stride length, Drifts right/left, Trunk flexed Gait velocity: decr     General Gait Details: Cues for RW proximity and upright posture       Balance Overall balance assessment: Needs assistance Sitting-balance support: No upper extremity supported, Feet supported Sitting balance-Leahy Scale: Fair Sitting balance - Comments: sitting EOB   Standing balance support: Bilateral upper extremity supported, During functional activity, Reliant on assistive device for balance Standing balance-Leahy Scale: Poor Standing balance comment: with RW support                            Cognition Arousal/Alertness: Awake/alert Behavior During Therapy: WFL for tasks assessed/performed Overall Cognitive Status: No family/caregiver present to determine baseline cognitive functioning                                 General Comments: Increased cues        Exercises      General Comments General comments (skin integrity, edema, etc.): SpO2 noted to drop to 84% during  ambulation and improved to >90% with a seated rest break and pursed lip breathing      Pertinent Vitals/Pain Pain Assessment Pain Assessment: No/denies pain     PT Goals (current goals can now be found in the care plan section) Acute Rehab PT Goals Patient Stated Goal: return to independence PT Goal Formulation: With patient Time For Goal Achievement: 02/22/23 Potential to Achieve Goals: Good Progress towards PT goals: Progressing toward goals     Frequency    Min 1X/week      PT Plan Current plan remains appropriate       AM-PAC PT "6 Clicks" Mobility   Outcome Measure  Help needed turning from your back to your side while in a flat bed without using bedrails?: A Little Help needed moving from lying on your back to sitting on the side of a flat bed without using bedrails?: A Little Help needed moving to and from a bed to a chair (including a wheelchair)?: A Little Help needed standing up from a chair using your arms (e.g., wheelchair or bedside chair)?: A Little Help needed to walk in hospital room?: A Little Help needed climbing 3-5 steps with a railing? : A Lot 6 Click Score: 17    End of Session Equipment Utilized During Treatment: Gait belt;Oxygen Activity Tolerance: Patient limited by fatigue Patient left: in chair;with call bell/phone within reach;with nursing/sitter in room Nurse Communication: Mobility status PT Visit Diagnosis: Unsteadiness on feet (R26.81);Other abnormalities of gait and mobility (R26.89);Muscle weakness (generalized) (M62.81)     Time: 1610-9604 PT Time Calculation (min) (ACUTE ONLY): 34 min  Charges:    $Gait Training: 8-22 mins $Therapeutic Activity: 8-22 mins PT General Charges $$ ACUTE PT VISIT: 1 Visit                     Johny Shock, PTA Acute Rehabilitation Services Secure Chat Preferred  [phone]    Johny Shock 02/12/2023, 1:44 PM

## 2023-02-12 NOTE — Plan of Care (Signed)
Patient ID: Shane Diaz, male   DOB: 1946/01/18, 77 y.o.   MRN: [id number]  Problem: Education: Goal: Knowledge of General Education information will improve Description: Including pain rating scale, medication(s)/side effects and non-pharmacologic comfort measures Outcome: Adequate for Discharge   Problem: Health Behavior/Discharge Planning: Goal: Ability to manage health-related needs will improve Outcome: Adequate for Discharge   Problem: Clinical Measurements: Goal: Ability to maintain clinical measurements within normal limits will improve Outcome: Adequate for Discharge Goal: Will remain free from infection Outcome: Adequate for Discharge Goal: Diagnostic test results will improve Outcome: Adequate for Discharge Goal: Respiratory complications will improve Outcome: Adequate for Discharge Goal: Cardiovascular complication will be avoided Outcome: Adequate for Discharge   Problem: Activity: Goal: Risk for activity intolerance will decrease Outcome: Adequate for Discharge   Problem: Nutrition: Goal: Adequate nutrition will be maintained Outcome: Adequate for Discharge   Problem: Coping: Goal: Level of anxiety will decrease Outcome: Adequate for Discharge   Problem: Elimination: Goal: Will not experience complications related to bowel motility Outcome: Adequate for Discharge Goal: Will not experience complications related to urinary retention Outcome: Adequate for Discharge   Problem: Pain Managment: Goal: General experience of comfort will improve Outcome: Adequate for Discharge   Problem: Safety: Goal: Ability to remain free from injury will improve Outcome: Adequate for Discharge   Problem: Skin Integrity: Goal: Risk for impaired skin integrity will decrease Outcome: Adequate for Discharge   Problem: Education: Goal: Ability to demonstrate management of disease process will improve Outcome: Adequate for Discharge Goal: Ability to verbalize understanding of  medication therapies will improve Outcome: Adequate for Discharge Goal: Individualized Educational Video(s) Outcome: Adequate for Discharge   Problem: Activity: Goal: Capacity to carry out activities will improve Outcome: Adequate for Discharge   Problem: Cardiac: Goal: Ability to achieve and maintain adequate cardiopulmonary perfusion will improve Outcome: Adequate for Discharge   Problem: Acute Rehab PT Goals(only PT should resolve) Goal: Pt will Roll Supine to Side Outcome: Adequate for Discharge Goal: Pt Will Go Supine/Side To Sit Outcome: Adequate for Discharge Goal: Patient Will Perform Sitting Balance Outcome: Adequate for Discharge Goal: Patient Will Transfer Sit To/From Stand Outcome: Adequate for Discharge Goal: Pt Will Ambulate Outcome: Adequate for Discharge Goal: Pt/caregiver will Perform Home Exercise Program Outcome: Adequate for Discharge    Lidia Collum, RN

## 2023-02-20 ENCOUNTER — Other Ambulatory Visit: Payer: Self-pay

## 2023-02-20 ENCOUNTER — Inpatient Hospital Stay (HOSPITAL_COMMUNITY): Payer: Medicare Other

## 2023-02-20 ENCOUNTER — Encounter (HOSPITAL_COMMUNITY): Payer: Medicare Other

## 2023-02-20 ENCOUNTER — Emergency Department (HOSPITAL_COMMUNITY): Payer: Medicare Other

## 2023-02-20 ENCOUNTER — Encounter (HOSPITAL_COMMUNITY): Payer: Self-pay

## 2023-02-20 ENCOUNTER — Inpatient Hospital Stay (HOSPITAL_COMMUNITY)
Admission: EM | Admit: 2023-02-20 | Discharge: 2023-02-28 | DRG: 871 | Disposition: A | Discharge status: Home Health | Payer: Medicare Other | Attending: Internal Medicine | Admitting: Internal Medicine

## 2023-02-20 DIAGNOSIS — K529 Noninfective gastroenteritis and colitis, unspecified: Secondary | ICD-10-CM | POA: Diagnosis present

## 2023-02-20 DIAGNOSIS — A4151 Sepsis due to Escherichia coli [E. coli]: Secondary | ICD-10-CM | POA: Diagnosis not present

## 2023-02-20 DIAGNOSIS — I482 Chronic atrial fibrillation, unspecified: Secondary | ICD-10-CM | POA: Diagnosis not present

## 2023-02-20 DIAGNOSIS — K8000 Calculus of gallbladder with acute cholecystitis without obstruction: Secondary | ICD-10-CM | POA: Diagnosis not present

## 2023-02-20 DIAGNOSIS — K760 Fatty (change of) liver, not elsewhere classified: Secondary | ICD-10-CM | POA: Diagnosis present

## 2023-02-20 DIAGNOSIS — N184 Chronic kidney disease, stage 4 (severe): Secondary | ICD-10-CM | POA: Diagnosis present

## 2023-02-20 DIAGNOSIS — E876 Hypokalemia: Secondary | ICD-10-CM | POA: Diagnosis present

## 2023-02-20 DIAGNOSIS — N1832 Chronic kidney disease, stage 3b: Secondary | ICD-10-CM | POA: Diagnosis not present

## 2023-02-20 DIAGNOSIS — R001 Bradycardia, unspecified: Secondary | ICD-10-CM | POA: Diagnosis present

## 2023-02-20 DIAGNOSIS — J9811 Atelectasis: Secondary | ICD-10-CM | POA: Diagnosis present

## 2023-02-20 DIAGNOSIS — K658 Other peritonitis: Secondary | ICD-10-CM | POA: Diagnosis not present

## 2023-02-20 DIAGNOSIS — R7989 Other specified abnormal findings of blood chemistry: Secondary | ICD-10-CM | POA: Diagnosis not present

## 2023-02-20 DIAGNOSIS — Z1152 Encounter for screening for COVID-19: Secondary | ICD-10-CM

## 2023-02-20 DIAGNOSIS — Z603 Acculturation difficulty: Secondary | ICD-10-CM | POA: Diagnosis present

## 2023-02-20 DIAGNOSIS — I5082 Biventricular heart failure: Secondary | ICD-10-CM | POA: Diagnosis present

## 2023-02-20 DIAGNOSIS — I4892 Unspecified atrial flutter: Secondary | ICD-10-CM | POA: Diagnosis present

## 2023-02-20 DIAGNOSIS — R7881 Bacteremia: Secondary | ICD-10-CM | POA: Diagnosis present

## 2023-02-20 DIAGNOSIS — N170 Acute kidney failure with tubular necrosis: Secondary | ICD-10-CM | POA: Diagnosis not present

## 2023-02-20 DIAGNOSIS — I5033 Acute on chronic diastolic (congestive) heart failure: Secondary | ICD-10-CM | POA: Diagnosis not present

## 2023-02-20 DIAGNOSIS — R6521 Severe sepsis with septic shock: Secondary | ICD-10-CM | POA: Diagnosis present

## 2023-02-20 DIAGNOSIS — B9620 Unspecified Escherichia coli [E. coli] as the cause of diseases classified elsewhere: Secondary | ICD-10-CM | POA: Diagnosis present

## 2023-02-20 DIAGNOSIS — A419 Sepsis, unspecified organism: Secondary | ICD-10-CM | POA: Diagnosis not present

## 2023-02-20 DIAGNOSIS — J9602 Acute respiratory failure with hypercapnia: Secondary | ICD-10-CM | POA: Diagnosis present

## 2023-02-20 DIAGNOSIS — Z79890 Hormone replacement therapy (postmenopausal): Secondary | ICD-10-CM

## 2023-02-20 DIAGNOSIS — G9341 Metabolic encephalopathy: Secondary | ICD-10-CM | POA: Diagnosis present

## 2023-02-20 DIAGNOSIS — I4819 Other persistent atrial fibrillation: Secondary | ICD-10-CM | POA: Diagnosis present

## 2023-02-20 DIAGNOSIS — I4891 Unspecified atrial fibrillation: Secondary | ICD-10-CM | POA: Diagnosis not present

## 2023-02-20 DIAGNOSIS — J9622 Acute and chronic respiratory failure with hypercapnia: Secondary | ICD-10-CM | POA: Diagnosis not present

## 2023-02-20 DIAGNOSIS — K7460 Unspecified cirrhosis of liver: Secondary | ICD-10-CM | POA: Diagnosis present

## 2023-02-20 DIAGNOSIS — I2510 Atherosclerotic heart disease of native coronary artery without angina pectoris: Secondary | ICD-10-CM | POA: Diagnosis present

## 2023-02-20 DIAGNOSIS — E039 Hypothyroidism, unspecified: Secondary | ICD-10-CM | POA: Diagnosis present

## 2023-02-20 DIAGNOSIS — K810 Acute cholecystitis: Secondary | ICD-10-CM | POA: Diagnosis present

## 2023-02-20 DIAGNOSIS — E662 Morbid (severe) obesity with alveolar hypoventilation: Secondary | ICD-10-CM | POA: Diagnosis present

## 2023-02-20 DIAGNOSIS — R609 Edema, unspecified: Secondary | ICD-10-CM | POA: Diagnosis not present

## 2023-02-20 DIAGNOSIS — E8881 Metabolic syndrome: Secondary | ICD-10-CM | POA: Diagnosis present

## 2023-02-20 DIAGNOSIS — Z8719 Personal history of other diseases of the digestive system: Secondary | ICD-10-CM

## 2023-02-20 DIAGNOSIS — Z79899 Other long term (current) drug therapy: Secondary | ICD-10-CM

## 2023-02-20 DIAGNOSIS — I878 Other specified disorders of veins: Secondary | ICD-10-CM | POA: Diagnosis present

## 2023-02-20 DIAGNOSIS — E785 Hyperlipidemia, unspecified: Secondary | ICD-10-CM | POA: Diagnosis present

## 2023-02-20 DIAGNOSIS — Z888 Allergy status to other drugs, medicaments and biological substances status: Secondary | ICD-10-CM

## 2023-02-20 DIAGNOSIS — I5032 Chronic diastolic (congestive) heart failure: Secondary | ICD-10-CM | POA: Diagnosis not present

## 2023-02-20 DIAGNOSIS — R55 Syncope and collapse: Secondary | ICD-10-CM | POA: Diagnosis present

## 2023-02-20 DIAGNOSIS — R0602 Shortness of breath: Principal | ICD-10-CM

## 2023-02-20 DIAGNOSIS — I480 Paroxysmal atrial fibrillation: Secondary | ICD-10-CM | POA: Diagnosis not present

## 2023-02-20 DIAGNOSIS — G928 Other toxic encephalopathy: Secondary | ICD-10-CM | POA: Diagnosis not present

## 2023-02-20 DIAGNOSIS — I441 Atrioventricular block, second degree: Secondary | ICD-10-CM | POA: Diagnosis present

## 2023-02-20 DIAGNOSIS — I4510 Unspecified right bundle-branch block: Secondary | ICD-10-CM | POA: Diagnosis present

## 2023-02-20 DIAGNOSIS — N179 Acute kidney failure, unspecified: Secondary | ICD-10-CM | POA: Diagnosis not present

## 2023-02-20 DIAGNOSIS — I872 Venous insufficiency (chronic) (peripheral): Secondary | ICD-10-CM | POA: Diagnosis present

## 2023-02-20 DIAGNOSIS — J9601 Acute respiratory failure with hypoxia: Secondary | ICD-10-CM | POA: Diagnosis not present

## 2023-02-20 DIAGNOSIS — K5900 Constipation, unspecified: Secondary | ICD-10-CM | POA: Diagnosis not present

## 2023-02-20 DIAGNOSIS — I130 Hypertensive heart and chronic kidney disease with heart failure and stage 1 through stage 4 chronic kidney disease, or unspecified chronic kidney disease: Secondary | ICD-10-CM | POA: Diagnosis not present

## 2023-02-20 DIAGNOSIS — G4733 Obstructive sleep apnea (adult) (pediatric): Secondary | ICD-10-CM | POA: Diagnosis present

## 2023-02-20 DIAGNOSIS — Z9981 Dependence on supplemental oxygen: Secondary | ICD-10-CM

## 2023-02-20 DIAGNOSIS — E038 Other specified hypothyroidism: Secondary | ICD-10-CM | POA: Diagnosis not present

## 2023-02-20 DIAGNOSIS — E871 Hypo-osmolality and hyponatremia: Secondary | ICD-10-CM | POA: Diagnosis present

## 2023-02-20 DIAGNOSIS — Z6841 Body Mass Index (BMI) 40.0 and over, adult: Secondary | ICD-10-CM | POA: Diagnosis not present

## 2023-02-20 DIAGNOSIS — J9621 Acute and chronic respiratory failure with hypoxia: Secondary | ICD-10-CM | POA: Diagnosis not present

## 2023-02-20 DIAGNOSIS — T426X5A Adverse effect of other antiepileptic and sedative-hypnotic drugs, initial encounter: Secondary | ICD-10-CM | POA: Diagnosis not present

## 2023-02-20 DIAGNOSIS — Z86718 Personal history of other venous thrombosis and embolism: Secondary | ICD-10-CM

## 2023-02-20 DIAGNOSIS — J9600 Acute respiratory failure, unspecified whether with hypoxia or hypercapnia: Secondary | ICD-10-CM | POA: Diagnosis not present

## 2023-02-20 DIAGNOSIS — Z7901 Long term (current) use of anticoagulants: Secondary | ICD-10-CM

## 2023-02-20 LAB — COMPREHENSIVE METABOLIC PANEL
ALT: 32 U/L (ref 0–44)
AST: 63 U/L — ABNORMAL HIGH (ref 15–41)
Albumin: 4.2 g/dL (ref 3.5–5.0)
Alkaline Phosphatase: 129 U/L — ABNORMAL HIGH (ref 38–126)
Anion gap: 22 — ABNORMAL HIGH (ref 5–15)
BUN: 21 mg/dL (ref 8–23)
CO2: 32 mmol/L (ref 22–32)
Calcium: 9.8 mg/dL (ref 8.9–10.3)
Chloride: 76 mmol/L — ABNORMAL LOW (ref 98–111)
Creatinine, Ser: 2.02 mg/dL — ABNORMAL HIGH (ref 0.61–1.24)
GFR, Estimated: 33 mL/min — ABNORMAL LOW (ref >60)
Glucose, Bld: 125 mg/dL — ABNORMAL HIGH (ref 70–99)
Potassium: 3.2 mmol/L — ABNORMAL LOW (ref 3.5–5.1)
Sodium: 130 mmol/L — ABNORMAL LOW (ref 135–145)
Total Bilirubin: 2.6 mg/dL — ABNORMAL HIGH (ref 0.3–1.2)
Total Protein: 9.6 g/dL — ABNORMAL HIGH (ref 6.5–8.1)

## 2023-02-20 LAB — I-STAT CHEM 8, ED
BUN: 26 mg/dL — ABNORMAL HIGH (ref 8–23)
Calcium, Ion: 0.97 mmol/L — ABNORMAL LOW (ref 1.15–1.40)
Chloride: 85 mmol/L — ABNORMAL LOW (ref 98–111)
Creatinine, Ser: 1.9 mg/dL — ABNORMAL HIGH (ref 0.61–1.24)
Glucose, Bld: 131 mg/dL — ABNORMAL HIGH (ref 70–99)
HCT: 65 % — ABNORMAL HIGH (ref 39.0–52.0)
Hemoglobin: 22.1 g/dL (ref 13.0–17.0)
Potassium: 3.1 mmol/L — ABNORMAL LOW (ref 3.5–5.1)
Sodium: 132 mmol/L — ABNORMAL LOW (ref 135–145)
TCO2: 35 mmol/L — ABNORMAL HIGH (ref 22–32)

## 2023-02-20 LAB — LACTIC ACID, PLASMA
Lactic Acid, Venous: 2.5 mmol/L (ref 0.5–1.9)
Lactic Acid, Venous: 1.6 mmol/L (ref 0.5–1.9)

## 2023-02-20 LAB — CBC WITH DIFFERENTIAL/PLATELET
Abs Immature Granulocytes: 0.18 10*3/uL — ABNORMAL HIGH (ref 0.00–0.07)
Basophils Absolute: 0.1 10*3/uL (ref 0.0–0.1)
Basophils Relative: 0 %
Eosinophils Absolute: 0 10*3/uL (ref 0.0–0.5)
Eosinophils Relative: 0 %
HCT: 59.3 % — ABNORMAL HIGH (ref 39.0–52.0)
Hemoglobin: 19.2 g/dL — ABNORMAL HIGH (ref 13.0–17.0)
Immature Granulocytes: 1 %
Lymphocytes Relative: 7 %
Lymphs Abs: 1.1 10*3/uL (ref 0.7–4.0)
MCH: 33 pg (ref 26.0–34.0)
MCHC: 32.4 g/dL (ref 30.0–36.0)
MCV: 101.9 fL — ABNORMAL HIGH (ref 80.0–100.0)
Monocytes Absolute: 0.2 10*3/uL (ref 0.1–1.0)
Monocytes Relative: 1 %
Neutro Abs: 13.9 10*3/uL — ABNORMAL HIGH (ref 1.7–7.7)
Neutrophils Relative %: 91 %
Platelets: 230 10*3/uL (ref 150–400)
RBC: 5.82 MIL/uL — ABNORMAL HIGH (ref 4.22–5.81)
RDW: 14.9 % (ref 11.5–15.5)
WBC: 15.5 10*3/uL — ABNORMAL HIGH (ref 4.0–10.5)
nRBC: 0 % (ref 0.0–0.2)

## 2023-02-20 LAB — POCT I-STAT 7, (LYTES, BLD GAS, ICA,H+H)
Acid-Base Excess: 10 mmol/L — ABNORMAL HIGH (ref 0.0–2.0)
Bicarbonate: 36.1 mmol/L — ABNORMAL HIGH (ref 20.0–28.0)
Calcium, Ion: 1.14 mmol/L — ABNORMAL LOW (ref 1.15–1.40)
HCT: 55 % — ABNORMAL HIGH (ref 39.0–52.0)
Hemoglobin: 18.7 g/dL — ABNORMAL HIGH (ref 13.0–17.0)
O2 Saturation: 100 %
Patient temperature: 99.3
Potassium: 2.6 mmol/L — CL (ref 3.5–5.1)
Sodium: 133 mmol/L — ABNORMAL LOW (ref 135–145)
TCO2: 38 mmol/L — ABNORMAL HIGH (ref 22–32)
pCO2 arterial: 49.9 mmHg — ABNORMAL HIGH (ref 32–48)
pH, Arterial: 7.469 — ABNORMAL HIGH (ref 7.35–7.45)
pO2, Arterial: 202 mmHg — ABNORMAL HIGH (ref 83–108)

## 2023-02-20 LAB — TROPONIN I (HIGH SENSITIVITY): Troponin I (High Sensitivity): 31 ng/L — ABNORMAL HIGH (ref <18)

## 2023-02-20 LAB — BRAIN NATRIURETIC PEPTIDE: B Natriuretic Peptide: 187.5 pg/mL — ABNORMAL HIGH (ref 0.0–100.0)

## 2023-02-20 LAB — PROCALCITONIN: Procalcitonin: 2.19 ng/mL

## 2023-02-20 LAB — GLUCOSE, CAPILLARY
Glucose-Capillary: 159 mg/dL — ABNORMAL HIGH (ref 70–99)
Glucose-Capillary: 118 mg/dL — ABNORMAL HIGH (ref 70–99)

## 2023-02-20 LAB — SARS CORONAVIRUS 2 BY RT PCR: SARS Coronavirus 2 by RT PCR: NEGATIVE

## 2023-02-20 MED ORDER — MIDODRINE HCL 5 MG PO TABS
5.0000 mg | ORAL_TABLET | Freq: Three times a day (TID) | ORAL | Status: DC
  Administered 2023-02-22: 5 mg via ORAL

## 2023-02-20 MED ORDER — LACTATED RINGERS IV BOLUS
1000.0000 mL | Freq: Once | INTRAVENOUS | Status: AC
  Administered 2023-02-20: 1000 mL via INTRAVENOUS

## 2023-02-20 MED ORDER — METRONIDAZOLE 500 MG/100ML IV SOLN
500.0000 mg | Freq: Once | INTRAVENOUS | Status: AC
  Administered 2023-02-20: 500 mg via INTRAVENOUS

## 2023-02-20 MED ORDER — LEVOTHYROXINE SODIUM 100 MCG PO TABS
100.0000 ug | ORAL_TABLET | Freq: Every day | ORAL | Status: DC
  Administered 2023-02-25 – 2023-02-28 (×4): 100 ug via ORAL
  Filled 2023-02-20 (×4): qty 1

## 2023-02-20 MED ORDER — POTASSIUM CHLORIDE 10 MEQ/50ML IV SOLN
10.0000 meq | INTRAVENOUS | Status: AC
  Administered 2023-02-20 – 2023-02-21 (×5): 10 meq via INTRAVENOUS
  Filled 2023-02-20: qty 50

## 2023-02-20 MED ORDER — NOREPINEPHRINE 16 MG/250ML-% IV SOLN
INTRAVENOUS | Status: AC
  Filled 2023-02-20: qty 250

## 2023-02-20 MED ORDER — APIXABAN 5 MG PO TABS
5.0000 mg | ORAL_TABLET | Freq: Two times a day (BID) | ORAL | Status: DC
  Filled 2023-02-20: qty 1

## 2023-02-20 MED ORDER — POTASSIUM CHLORIDE 10 MEQ/100ML IV SOLN
10.0000 meq | INTRAVENOUS | Status: DC
  Administered 2023-02-20: 10 meq via INTRAVENOUS
  Filled 2023-02-20: qty 100

## 2023-02-20 MED ORDER — VANCOMYCIN HCL IN DEXTROSE 1-5 GM/200ML-% IV SOLN: 1000.0000 mg | Freq: Once | INTRAVENOUS | Status: AC

## 2023-02-20 MED ORDER — MAGNESIUM SULFATE 2 GM/50ML IV SOLN
2.0000 g | Freq: Once | INTRAVENOUS | Status: AC
  Administered 2023-02-20: 2 g via INTRAVENOUS
  Filled 2023-02-20: qty 50

## 2023-02-20 MED ORDER — VANCOMYCIN HCL 10 G IV SOLR
2250.0000 mg | INTRAVENOUS | Status: DC
  Filled 2023-02-20: qty 22.5

## 2023-02-20 MED ORDER — ATROPINE SULFATE 1 MG/10ML IJ SOSY
0.5000 mg | PREFILLED_SYRINGE | Freq: Once | INTRAMUSCULAR | Status: AC | PRN
  Administered 2023-02-23: 0.5 mg via INTRAVENOUS

## 2023-02-20 MED ORDER — DOCUSATE SODIUM 100 MG PO CAPS
100.0000 mg | ORAL_CAPSULE | Freq: Two times a day (BID) | ORAL | Status: DC | PRN
  Administered 2023-02-24: 100 mg via ORAL
  Filled 2023-02-20: qty 1

## 2023-02-20 MED ORDER — NOREPINEPHRINE 4 MG/250ML-% IV SOLN
0.0000 ug/min | INTRAVENOUS | Status: DC
  Administered 2023-02-20: 10 ug/min via INTRAVENOUS

## 2023-02-20 MED ORDER — VANCOMYCIN HCL 1500 MG/300ML IV SOLN
1500.0000 mg | Freq: Once | INTRAVENOUS | Status: AC
  Administered 2023-02-20: 1500 mg via INTRAVENOUS
  Filled 2023-02-20: qty 300

## 2023-02-20 MED ORDER — FENTANYL CITRATE PF 50 MCG/ML IJ SOSY
PREFILLED_SYRINGE | INTRAMUSCULAR | Status: AC
  Filled 2023-02-20: qty 2

## 2023-02-20 MED ORDER — NOREPINEPHRINE 16 MG/250ML-% IV SOLN
0.0000 ug/min | INTRAVENOUS | Status: DC
  Administered 2023-02-20: 14 ug/min via INTRAVENOUS

## 2023-02-20 MED ORDER — ATROPINE SULFATE 1 MG/10ML IJ SOSY
PREFILLED_SYRINGE | INTRAMUSCULAR | Status: AC
  Filled 2023-02-20: qty 10

## 2023-02-20 MED ORDER — LORAZEPAM 2 MG/ML IJ SOLN
INTRAMUSCULAR | Status: AC
  Filled 2023-02-20: qty 1

## 2023-02-20 MED ORDER — HEPARIN SODIUM (PORCINE) 5000 UNIT/ML IJ SOLN: 5000.0000 [IU] | Freq: Three times a day (TID) | INTRAMUSCULAR | Status: DC

## 2023-02-20 MED ORDER — PIPERACILLIN-TAZOBACTAM 3.375 G IVPB
3.3750 g | Freq: Three times a day (TID) | INTRAVENOUS | Status: DC
  Administered 2023-02-21: 3.375 g via INTRAVENOUS
  Filled 2023-02-20 (×2): qty 50

## 2023-02-20 MED ORDER — MIDODRINE HCL 5 MG PO TABS
5.0000 mg | ORAL_TABLET | Freq: Once | ORAL | Status: AC
  Administered 2023-02-20: 5 mg via ORAL

## 2023-02-20 MED ORDER — POLYETHYLENE GLYCOL 3350 17 G PO PACK
17.0000 g | PACK | Freq: Every day | ORAL | Status: DC | PRN
  Administered 2023-02-24: 17 g via ORAL
  Filled 2023-02-20: qty 1

## 2023-02-20 MED ORDER — LACTATED RINGERS IV SOLN: INTRAVENOUS | Status: AC

## 2023-02-20 MED ORDER — SODIUM CHLORIDE 0.9 % IV SOLN
2.0000 g | Freq: Once | INTRAVENOUS | Status: AC
  Administered 2023-02-20: 2 g via INTRAVENOUS

## 2023-02-20 MED ORDER — EPINEPHRINE 1 MG/10ML IJ SOSY
PREFILLED_SYRINGE | INTRAMUSCULAR | Status: AC
  Filled 2023-02-20: qty 10

## 2023-02-20 NOTE — H&P (Addendum)
NAME:  Shane Diaz, MRN:  [id number], DOB:  10/11/1945, LOS: 0 ADMISSION DATE:  02/20/2023, CONSULTATION DATE:  02/20/23 REFERRING MD:  Dalene Seltzer, CHIEF COMPLAINT:  shock   History of Present Illness:  Shane Diaz is a 77 y.o. M with PMH of HFpEF, HTN, OSA, CKD 3b, Atrial Fibrillation on Apixaban, venous insufficiency, hypothyroidism,  HL, chronic respiratory failure on home O2 who presented to the ED on 7/19 with acutely worsening shortness of breath and worsening cough.   He was recently admitted for acute CHF exacerbation and discharged home on 7/11 improved after diuresis.   He arrived to the ED on CPAP after having a syncopal event the night before per wife.  CXR was consistent with bilateral pleural effusions without clear infiltrate.   He was hypotensive, so given one L bolus with minimal improvement and started on Levophed.  Labs significant for lactic acid of 2.5, Na 132, K 3.1, creatinine near baseline of 1.9, BNP 187, WBC 15.   In this setting PCCM consulted for admission  Wife reports chest, back, abdomen pain as well as confusion last night after fall.  Confusion has resolved.  Pertinent  Medical History   has a past medical history of Acute diastolic heart failure (HCC) (27/25/3664), Arthritis, Diverticulosis (05/2015), Fatty liver (05/14/2015), History of colon polyps (05/2014), Hyperlipidemia, Hypertension, OSA (obstructive sleep apnea) (08/03/2020), Renal cyst (05/2014), Umbilical hernia, and Varicose veins.   Significant Hospital Events: Including procedures, antibiotic start and stop dates in addition to other pertinent events   7/19 Presented to the ED after syncopal event with increased shortness of breath and hypotension requiring pressors   Interim History / Subjective:  As above   Objective   Blood pressure (!) 98/55, pulse 85, temperature 99.2 F (37.3 C), temperature source Axillary, resp. rate 18, SpO2 97%.    FiO2 (%):  [100 %] 100 %  No intake or output  data in the 24 hours ending 02/20/23 1715 There were no vitals filed for this visit.  General:  chronically ill man on BIPAP HEENT: BIPAP in place with good seal Neuro: moves all 4 ext to command, pupils equal, no facial droop CV: s1s2 bedside echo function looks okay, no m/r/g PULM:  diminished bases, no accessory muscle use GI: soft, bsx4 active  Extremities: warm/dry, no rashes Skin: severe venous stasis  Mild AKI K low BNP not high Lactate mildly up All cell lines look high Febrile on admit  Pan CT pending  Resolved Hospital Problem list     Assessment & Plan:  Shock state- some combination septic and hypovolemic; baseline vasoplegia complicating Afib w/o RVR- on eliquis PTA Metabolic syndrome: >600 lbs Chronic diastolic heart failure Hypothyroidism- last TSH was 83; thought related to amio, started on synthroid recently Hypokalemia, hyponatremia, AKI on CKD4 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  - Vanc/zosyn, f/u cultures, Pct, pan-scan - BIPAP for WOB - Levophed for MAP 65, LR another 1L bolus and give 1 day @ 100cc/hr - Will see if we can get a PICC if not we can place line if does not respond to fluids - RUQ [place] - Resume home midodrine, eliquis, synthroid if able to take PO  Best Practice (right click and "Reselect all SmartList Selections" daily)   Diet/type: NPO ok for sips w/ meds DVT prophylaxis: DOAC GI prophylaxis: N/A Lines: N/A Foley:  N/A Code Status:  full code Last date of multidisciplinary goals of care discussion [Updated patient and wife at bedside, they confirm full code]  Labs  CBC: Recent Labs  Lab 02/20/23 1500 02/20/23 1529  WBC 15.5*  --   NEUTROABS 13.9*  --   HGB 19.2* 22.1*  HCT 59.3* 65.0*  MCV 101.9*  --   PLT 230  --     Basic Metabolic Panel: Recent Labs  Lab 02/20/23 1500 02/20/23 1529  NA 130* 132*  K 3.2* 3.1*  CL 76* 85*  CO2 32  --   GLUCOSE 125* 131*  BUN 21 26*  CREATININE 2.02* 1.90*  CALCIUM 9.8  --     GFR: Estimated Creatinine Clearance: 73.1 mL/min (A) (by C-G formula based on SCr of 1.9 mg/dL (H)). Recent Labs  Lab 02/20/23 1500 02/20/23 1553  WBC 15.5*  --   LATICACIDVEN  --  2.5*    Liver Function Tests: Recent Labs  Lab 02/20/23 1500  AST 63*  ALT 32  ALKPHOS 129*  BILITOT 2.6*  PROT 9.6*  ALBUMIN 4.2   No results for input(s): "LIPASE", "AMYLASE" in the last 168 hours. No results for input(s): "AMMONIA" in the last 168 hours.  ABG    Component Value Date/Time   PHART 7.49 (H) 02/07/2023 0805   PCO2ART 64 (H) 02/07/2023 0805   PO2ART 71 (L) 02/07/2023 0805   HCO3 48.8 (H) 02/07/2023 0805   TCO2 35 (H) 02/20/2023 1529   O2SAT 96.3 02/07/2023 0805     Coagulation Profile: No results for input(s): "INR", "PROTIME" in the last 168 hours.  Cardiac Enzymes: No results for input(s): "CKTOTAL", "CKMB", "CKMBINDEX", "TROPONINI" in the last 168 hours.  HbA1C: Hgb A1c MFr Bld  Date/Time Value Ref Range Status  05/08/2020 03:35 AM 5.8 (H) 4.8 - 5.6 % Final    Comment:    (NOTE) Pre diabetes:          5.7%-6.4%  Diabetes:              >6.4%  Glycemic control for   <7.0% adults with diabetes     CBG: No results for input(s): "GLUCAP" in the last 168 hours.  Review of Systems:    Positive Symptoms in bold:  Constitutional fevers, chills, weight loss, fatigue, anorexia, malaise  Eyes decreased vision, double vision, eye irritation  Ears, Nose, Mouth, Throat sore throat, trouble swallowing, sinus congestion  Cardiovascular chest pain, paroxysmal nocturnal dyspnea, lower ext edema, palpitations   Respiratory SOB, cough, DOE, hemoptysis, wheezing  Gastrointestinal nausea, vomiting, diarrhea  Genitourinary burning with urination, trouble urinating  Musculoskeletal joint aches, joint swelling, back pain  Integumentary  rashes, skin lesions  Neurological focal weakness, focal numbness, trouble speaking, headaches  Psychiatric depression, anxiety,  confusion  Endocrine polyuria, polydipsia, cold intolerance, heat intolerance  Hematologic abnormal bruising, abnormal bleeding, unexplained nose bleeds  Allergic/Immunologic recurrent infections, hives, swollen lymph nodes     Past Medical History:  He,  has a past medical history of Acute diastolic heart failure (HCC) (95/62/1308), Arthritis, Diverticulosis (05/2015), Fatty liver (05/14/2015), History of colon polyps (05/2014), Hyperlipidemia, Hypertension, OSA (obstructive sleep apnea) (08/03/2020), Renal cyst (05/2014), Umbilical hernia, and Varicose veins.   Surgical History:   Past Surgical History:  Procedure Laterality Date   APPENDECTOMY     childhood   BIOPSY  07/11/2020   Procedure: BIOPSY;  Surgeon: Meryl Dare, MD;  Location: [facility] ENDOSCOPY;  Service: Endoscopy;;   CARDIOVERSION N/A 07/13/2020   Procedure: CARDIOVERSION;  Surgeon: Wendall Stade, MD;  Location: [facility] ENDOSCOPY;  Service: Cardiovascular;  Laterality: N/A;   CARDIOVERSION N/A 08/02/2020   Procedure: CARDIOVERSION;  Surgeon: Christell Constant, MD;  Location: [facility] ENDOSCOPY;  Service: Cardiovascular;  Laterality: N/A;   CARDIOVERSION N/A 12/12/2021   Procedure: CARDIOVERSION;  Surgeon: Pricilla Riffle, MD;  Location: [facility] ENDOSCOPY;  Service: Cardiovascular;  Laterality: N/A;   COLONOSCOPY W/ POLYPECTOMY  05/23/2014   COLONOSCOPY WITH PROPOFOL N/A 07/11/2020   Procedure: COLONOSCOPY WITH PROPOFOL;  Surgeon: Meryl Dare, MD;  Location: [facility] ENDOSCOPY;  Service: Endoscopy;  Laterality: N/A;   ESOPHAGOGASTRODUODENOSCOPY (EGD) WITH PROPOFOL N/A 07/11/2020   Procedure: ESOPHAGOGASTRODUODENOSCOPY (EGD) WITH PROPOFOL;  Surgeon: Meryl Dare, MD;  Location: [facility] ENDOSCOPY;  Service: Endoscopy;  Laterality: N/A;   TEE WITHOUT CARDIOVERSION N/A 07/13/2020   Procedure: TRANSESOPHAGEAL ECHOCARDIOGRAM (TEE);  Surgeon: Wendall Stade, MD;  Location: [facility] ENDOSCOPY;  Service: Cardiovascular;  Laterality: N/A;   UMBILICAL  HERNIA REPAIR N/A 04/27/2018   Procedure: REPAIR UMBILICAL HERNIA ERAS PATHWAY;  Surgeon: Ovidio Kin, MD;  Location: [facility] ORS;  Service: General;  Laterality: N/A;   varicose veins     WRIST SURGERY       Social History:   reports that he has never smoked. He has never used smokeless tobacco. He reports current alcohol use. He reports that he does not use drugs.   Family History:  His family history is negative for Colon cancer, Esophageal cancer, Pancreatic cancer, Prostate cancer, Rectal cancer, and Stomach cancer.   Allergies Allergies  Allergen Reactions   Diltiazem     rash   Amiodarone Rash    Amiodarone DC'd 02/07/2023 due to concern causing hypothyroidism.      Home Medications  Prior to Admission medications   Medication Sig Start Date End Date Taking? Authorizing Provider  apixaban (ELIQUIS) 5 MG TABS tablet Take 1 tablet (5 mg total) by mouth 2 (two) times daily. 10/23/22   [name], [name], MD  bumetanide (BUMEX) 2 MG tablet TAKE 2 TABLETS BY MOUTH TWICE A DAY 01/12/23   [name], [name], MD  levothyroxine (SYNTHROID) 100 MCG tablet Take 1 tablet (100 mcg total) by mouth daily at 6 (six) AM. 02/13/23 03/15/23  [name], [name], MD  midodrine (PROAMATINE) 5 MG tablet Take 1 tablet (5 mg total) by mouth 2 (two) times daily with a meal. 02/12/23 03/14/23  [name], [name], MD  nitroGLYCERIN (NITROSTAT) 0.4 MG SL tablet Place 1 tablet (0.4 mg total) under the tongue every 5 (five) minutes x 3 doses as needed for chest pain. 12/04/21   [name], [name], MD     Critical care time: 34 min independent of procedures

## 2023-02-20 NOTE — ED Provider Notes (Signed)
  Physical Exam  BP (!) 87/51   Pulse (!) 33   Temp (!) 101.7 F (38.7 C) (Axillary)   Resp (!) 30   SpO2 97%   Physical Exam  Procedures  Procedures  ED Course / MDM   This 77 year old male with a history of OSA, right heart failure, chronic venous sufficiency, cor pulmonale, obesity hypoventilation syndrome, chronic respiratory failure on home O2 with recurrent DVT, persistent atrial fibrillation, recent admission for acute on chronic diastolic congestive heart failure with acute respiratory failure with hypoxia and hypercapnia, acute metabolic encephalopathy, who presents with concern for syncopal episode, chest pain, shortness of breath

## 2023-02-20 NOTE — Progress Notes (Signed)
Patient transported on Bipap from ED to CT to 2M08 without any complications.

## 2023-02-20 NOTE — Progress Notes (Signed)
Pt arrived to unit @ 1830. On BIPAP and NP called to bedside. CVC/ Art line now in place. Pt unable to answer orientation questions due to English not being primary language.

## 2023-02-20 NOTE — ED Triage Notes (Signed)
Pt BIB EMS for [facility]. Pt had syncopal episode last night eitnessed by wife, pt was unconscious for 10 min per wife.  Pt c/o chest pain and shob. Pt arrives on cpap and mottled

## 2023-02-20 NOTE — Procedures (Signed)
Central Venous Catheter Insertion Procedure Note  Cashus Halterman  [id number]  1945/11/10  Date:02/20/23  Time:7:15 PM   Provider Performing:Farra Nikolic R Billy Rocco   Procedure: Insertion of Non-tunneled Central Venous Catheter(36556) with US guidance (08657)   Indication(s) Medication administration  Consent Unable to obtain consent due to emergent nature of procedure.  No interpreter available,   Anesthesia Topical only with 1% lidocaine   Timeout Verified patient identification, verified procedure, site/side was marked, verified correct patient position, special equipment/implants available, medications/allergies/relevant history reviewed, required imaging and test results available.  Sterile Technique Maximal sterile technique including full sterile barrier drape, hand hygiene, sterile gown, sterile gloves, mask, hair covering, sterile ultrasound probe cover (if used).  Procedure Description Area of catheter insertion was cleaned with chlorhexidine and draped in sterile fashion.  With real-time ultrasound guidance a central venous catheter was placed into the right femoral vein. Nonpulsatile blood flow and easy flushing noted in all ports.  The catheter was sutured in place and sterile dressing applied.  Complications/Tolerance None; patient tolerated the procedure well. Chest X-ray is ordered to verify placement for internal jugular or subclavian cannulation.   Chest x-ray is not ordered for femoral cannulation.  EBL Minimal  Specimen(s) None   Darcella Gasman Sheree Lalla, PA-C

## 2023-02-20 NOTE — ED Notes (Signed)
Pt on bipap

## 2023-02-20 NOTE — Progress Notes (Signed)
ED Pharmacy Antibiotic Sign Off An antibiotic consult was received from an ED provider for Cefepime and Vancomycin per pharmacy dosing for bacteremia. A chart review was completed to assess appropriateness.   The following one time order(s) were placed:  Cefepime 2g IV x1 Vancomycin 1500mg  IV x1 followed by 1000mg  IV x1 for a total of 2500mg  loading dose  Further antibiotic and/or antibiotic pharmacy consults should be ordered by the admitting provider if indicated.   Thank you for allowing pharmacy to be a part of this patient's care.   Wilburn Cornelia, PharmD, BCPS Clinical Pharmacist 02/20/2023 4:50 PM   Please refer to [facility] for pharmacy phone number

## 2023-02-20 NOTE — Progress Notes (Signed)
eLink Physician-Brief Progress Note Patient Name: Shane Diaz DOB: 11-Aug-1945 MRN: [id number]   Date of Service  02/20/2023  HPI/Events of Note  ABG reviewed, Bedside RN will assist patient to try to pee and bladder scan him if he is unable to pee. Patient is having asymptomatic bradycardia with heart rate dropping to the 30's to 40's.  eICU Interventions  Reduce FiO2, Quad concentrate Levo, will order a dose of 0.5 mg of Atropine PRN heart rate drop accompanied by hypotension only, cardiology consultation.        Thomasene Lot Elah Avellino 02/20/2023, 8:30 PM

## 2023-02-20 NOTE — Progress Notes (Addendum)
Pharmacy Antibiotic Note  Gunnar Hereford is a 77 y.o. male admitted on 02/20/2023 with pneumonia and bacteremia.  Pharmacy has been consulted for Zosyn(piperacillin-tazobactam)) and vancomycin dosing.  Cefepime 2g IV x1, Metronidazole 500mg  IV x1, Vancomycin 2500mg  IV loading dose given in ED  Wbc:15.5, Temp:99.44F, sCr: 2.02  Plan: Zosyn 3.375g IV q8h  infused over 4 hours Vancomycin 2250 IV every 24 hours. eAUC~ 528  Goal AUC: 400-550 Check levels at steady state Monitor daily CBC, temp, SCr, and for clinical signs of improvement  F/u cultures and de-escalate antibiotics as able    Temp (24hrs), Avg:100.5 F (38.1 C), Min:99.2 F (37.3 C), Max:101.7 F (38.7 C)  Recent Labs  Lab 02/20/23 1500 02/20/23 1529 02/20/23 1553  WBC 15.5*  --   --   CREATININE 2.02* 1.90*  --   LATICACIDVEN  --   --  2.5*    Estimated Creatinine Clearance: 73.1 mL/min (A) (by C-G formula based on SCr of 1.9 mg/dL (H)).    Allergies  Allergen Reactions   Diltiazem     rash   Amiodarone Rash    Amiodarone DC'd 02/07/2023 due to concern causing hypothyroidism.     Antimicrobials this admission: Zosyn 07/19 >>  Vancomycin 07/19 >> Cefepime 07/19 x1 Metronidazole 07/19 x1  Dose adjustments this admission: N/a  Microbiology results: 07/19 BCx: sent x2  Thank you for allowing pharmacy to be a part of this patient's care.  Caprice Beaver PharmD Candidate 2025 02/20/2023 9:26 PM

## 2023-02-20 NOTE — Procedures (Signed)
Arterial Catheter Insertion Procedure Note  Shane Diaz  [id number]  May 14, 1946  Date:02/20/23  Time:7:17 PM    Provider Performing: Darcella Gasman Cyanna Neace    Procedure: Insertion of Arterial Line (21308) with US guidance (65784)   Indication(s) Blood pressure monitoring and/or need for frequent ABGs  Consent Unable to obtain consent due to emergent nature of procedure. No interpreter available   Anesthesia None   Time Out Verified patient identification, verified procedure, site/side was marked, verified correct patient position, special equipment/implants available, medications/allergies/relevant history reviewed, required imaging and test results available.   Sterile Technique Maximal sterile technique including full sterile barrier drape, hand hygiene, sterile gown, sterile gloves, mask, hair covering, sterile ultrasound probe cover (if used).   Procedure Description Area of catheter insertion was cleaned with chlorhexidine and draped in sterile fashion. With real-time ultrasound guidance an arterial catheter was placed into the right femoral artery.  Appropriate arterial tracings confirmed on monitor.     Complications/Tolerance None; patient tolerated the procedure well.   EBL Minimal   Specimen(s) None  Darcella Gasman Loralie Malta, PA-C

## 2023-02-20 NOTE — ED Provider Notes (Signed)
[place] EMERGENCY DEPARTMENT AT [facility] Provider Note   CSN: [id number] Arrival date & time: 02/20/23  1437     History  Chief Complaint  Patient presents with   Shortness of Breath    Shane Diaz is a 77 y.o. male.  77 yo M with a cc of sob.  Had a syncopal event last night.  Developed chest pain post.  This morning continued called EMS.  Resolved before their arrival.  Significantly sob.  Thinks maybe he's had more fluid on him recently.  Just admitted about a week ago.    Shortness of Breath      Home Medications Prior to Admission medications   Medication Sig Start Date End Date Taking? Authorizing Provider  apixaban (ELIQUIS) 5 MG TABS tablet Take 1 tablet (5 mg total) by mouth 2 (two) times daily. 10/23/22   [name], [name], MD  bumetanide (BUMEX) 2 MG tablet TAKE 2 TABLETS BY MOUTH TWICE A DAY 01/12/23   [name], [name], MD  levothyroxine (SYNTHROID) 100 MCG tablet Take 1 tablet (100 mcg total) by mouth daily at 6 (six) AM. 02/13/23 03/15/23  [name], [name], MD  midodrine (PROAMATINE) 5 MG tablet Take 1 tablet (5 mg total) by mouth 2 (two) times daily with a meal. 02/12/23 03/14/23  [name], [name], MD  nitroGLYCERIN (NITROSTAT) 0.4 MG SL tablet Place 1 tablet (0.4 mg total) under the tongue every 5 (five) minutes x 3 doses as needed for chest pain. 12/04/21   [name], [name], MD      Allergies    Diltiazem and Amiodarone    Review of Systems   Review of Systems  Respiratory:  Positive for shortness of breath.     Physical Exam Updated Vital Signs BP 115/74   Pulse (!) 39   Temp (!) 101.7 F (38.7 C) (Axillary)   Resp (!) 32   SpO2 99%  Physical Exam Vitals and nursing note reviewed.  Constitutional:      Appearance: He is well-developed.  HENT:     Head: Normocephalic and atraumatic.  Eyes:     Pupils: Pupils are equal, round, and reactive to light.  Neck:     Vascular: No JVD.  Cardiovascular:     Rate and Rhythm:  Normal rate and regular rhythm.     Heart sounds: No murmur heard.    No friction rub. No gallop.  Pulmonary:     Effort: No respiratory distress.     Breath sounds: No wheezing.     Comments: tachypnea Abdominal:     General: There is no distension.     Tenderness: There is no abdominal tenderness. There is no guarding or rebound.  Musculoskeletal:        General: Normal range of motion.     Cervical back: Normal range of motion and neck supple.     Right lower leg: Edema present.     Left lower leg: Edema present.     Comments: Leg edema 2+ up to the abdomen  Chronic skin changes.   Skin:    Coloration: Skin is not pale.     Findings: No rash.  Neurological:     Mental Status: He is alert and oriented to person, place, and time.  Psychiatric:        Behavior: Behavior normal.     ED Results / Procedures / Treatments   Labs (all labs ordered are listed, but only abnormal results are displayed) Labs Reviewed  CBC WITH DIFFERENTIAL/PLATELET -  Abnormal; Notable for the following components:      Result Value   WBC 15.5 (*)    RBC 5.82 (*)    Hemoglobin 19.2 (*)    HCT 59.3 (*)    MCV 101.9 (*)    Neutro Abs 13.9 (*)    Abs Immature Granulocytes 0.18 (*)    All other components within normal limits  I-STAT CHEM 8, ED - Abnormal; Notable for the following components:   Sodium 132 (*)    Potassium 3.1 (*)    Chloride 85 (*)    BUN 26 (*)    Creatinine, Ser 1.90 (*)    Glucose, Bld 131 (*)    Calcium, Ion 0.97 (*)    TCO2 35 (*)    Hemoglobin 22.1 (*)    HCT 65.0 (*)    All other components within normal limits  CULTURE, BLOOD (ROUTINE X 2)  CULTURE, BLOOD (ROUTINE X 2)  SARS CORONAVIRUS 2 BY RT PCR  COMPREHENSIVE METABOLIC PANEL  BRAIN NATRIURETIC PEPTIDE  LACTIC ACID, PLASMA  LACTIC ACID, PLASMA  URINALYSIS, W/ REFLEX TO CULTURE (INFECTION SUSPECTED)  TROPONIN I (HIGH SENSITIVITY)    EKG EKG Interpretation Date/Time:  Friday February 20 2023 14:38:35  EDT Ventricular Rate:  118 PR Interval:    QRS Duration:  159 QT Interval:  440 QTC Calculation: 593 R Axis:   103  Text Interpretation: Atrial fibrillation Paired ventricular premature complexes RBBB and LPFB Lateral infarct, acute Artifact in lead(s) I background noise TECHNICALLY DIFFICULT Confirmed by Melene Plan [phone]) on 02/20/2023 3:08:33 PM  Radiology DG Chest Port 1 View  Result Date: 02/20/2023 CLINICAL DATA:  Shortness of breath. EXAM: PORTABLE CHEST 1 VIEW COMPARISON:  02/06/2023. FINDINGS: Small bilateral pleural effusions with adjacent left basilar atelectasis. Low lung volumes accentuate the cardiomediastinal silhouette. No pneumothorax. IMPRESSION: Small bilateral pleural effusions with adjacent left basilar atelectasis. Electronically Signed   By: Orvan Falconer M.D.   On: 02/20/2023 15:13    Procedures .Critical Care  Performed by: Melene Plan, DO Authorized by: Melene Plan, DO   Critical care provider statement:    Critical care time (minutes):  35   Critical care time was exclusive of:  Separately billable procedures and treating other patients   Critical care was time spent personally by me on the following activities:  Development of treatment plan with patient or surrogate, discussions with consultants, evaluation of patient's response to treatment, examination of patient, ordering and review of laboratory studies, ordering and review of radiographic studies, ordering and performing treatments and interventions, pulse oximetry, re-evaluation of patient's condition and review of old charts   Care discussed with: admitting provider       Medications Ordered in ED Medications  metroNIDAZOLE (FLAGYL) IVPB 500 mg (has no administration in time range)    ED Course/ Medical Decision Making/ A&P                             Medical Decision Making Amount and/or Complexity of Data Reviewed Labs: ordered. Radiology: ordered.  Risk Prescription drug  management.   77 yo M with a cc of sob.  Had a syncopal event last night with some chest pain, found with significant work of breathing on his 5L.  Started on cpap with improvement.    CXR without obvious fluid overload, no obvious focal infiltrate on my independent interpretation.    Signed out to Dr. Dalene Seltzer, please see their note for  further details of care in the ED.  The patients results and plan were reviewed and discussed.   Any x-rays performed were independently reviewed by myself.   Differential diagnosis were considered with the presenting HPI.  Medications  metroNIDAZOLE (FLAGYL) IVPB 500 mg (has no administration in time range)    Vitals:   02/20/23 1445 02/20/23 1506  BP: 115/74   Pulse: (!) 39   Resp: (!) 32   Temp:  (!) 101.7 F (38.7 C)  TempSrc:  Axillary  SpO2: 99%     Final diagnoses:  SOB (shortness of breath)           Final Clinical Impression(s) / ED Diagnoses Final diagnoses:  SOB (shortness of breath)    Rx / DC Orders ED Discharge Orders     None         Melene Plan, DO 02/20/23 1547

## 2023-02-21 ENCOUNTER — Inpatient Hospital Stay (HOSPITAL_COMMUNITY): Payer: Medicare Other

## 2023-02-21 DIAGNOSIS — R7989 Other specified abnormal findings of blood chemistry: Secondary | ICD-10-CM

## 2023-02-21 DIAGNOSIS — A4151 Sepsis due to Escherichia coli [E. coli]: Principal | ICD-10-CM

## 2023-02-21 DIAGNOSIS — R6521 Severe sepsis with septic shock: Secondary | ICD-10-CM | POA: Diagnosis not present

## 2023-02-21 DIAGNOSIS — N179 Acute kidney failure, unspecified: Secondary | ICD-10-CM

## 2023-02-21 DIAGNOSIS — J9600 Acute respiratory failure, unspecified whether with hypoxia or hypercapnia: Secondary | ICD-10-CM | POA: Diagnosis not present

## 2023-02-21 DIAGNOSIS — I4891 Unspecified atrial fibrillation: Secondary | ICD-10-CM | POA: Diagnosis not present

## 2023-02-21 DIAGNOSIS — A419 Sepsis, unspecified organism: Secondary | ICD-10-CM | POA: Diagnosis not present

## 2023-02-21 DIAGNOSIS — I5032 Chronic diastolic (congestive) heart failure: Secondary | ICD-10-CM

## 2023-02-21 DIAGNOSIS — R609 Edema, unspecified: Secondary | ICD-10-CM | POA: Diagnosis not present

## 2023-02-21 LAB — BLOOD CULTURE ID PANEL (REFLEXED) - BCID2

## 2023-02-21 LAB — CBC
HCT: 51.2 % (ref 39.0–52.0)
Hemoglobin: 16.8 g/dL (ref 13.0–17.0)
MCH: 33.1 pg (ref 26.0–34.0)
MCHC: 32.8 g/dL (ref 30.0–36.0)
MCV: 101 fL — ABNORMAL HIGH (ref 80.0–100.0)
Platelets: 209 10*3/uL (ref 150–400)
RBC: 5.07 MIL/uL (ref 4.22–5.81)
RDW: 14.9 % (ref 11.5–15.5)
WBC: 35 10*3/uL — ABNORMAL HIGH (ref 4.0–10.5)
nRBC: 0 % (ref 0.0–0.2)

## 2023-02-21 LAB — HEPATIC FUNCTION PANEL
ALT: 25 U/L (ref 0–44)
AST: 39 U/L (ref 15–41)
Albumin: 2.7 g/dL — ABNORMAL LOW (ref 3.5–5.0)
Alkaline Phosphatase: 86 U/L (ref 38–126)
Bilirubin, Direct: 0.6 mg/dL — ABNORMAL HIGH (ref 0.0–0.2)
Indirect Bilirubin: 1 mg/dL — ABNORMAL HIGH (ref 0.3–0.9)
Total Bilirubin: 1.6 mg/dL — ABNORMAL HIGH (ref 0.3–1.2)
Total Protein: 6.8 g/dL (ref 6.5–8.1)

## 2023-02-21 LAB — BASIC METABOLIC PANEL
Anion gap: 14 (ref 5–15)
Anion gap: 13 (ref 5–15)
BUN: 28 mg/dL — ABNORMAL HIGH (ref 8–23)
BUN: 33 mg/dL — ABNORMAL HIGH (ref 8–23)
CO2: 31 mmol/L (ref 22–32)
CO2: 30 mmol/L (ref 22–32)
Calcium: 8.8 mg/dL — ABNORMAL LOW (ref 8.9–10.3)
Calcium: 8.6 mg/dL — ABNORMAL LOW (ref 8.9–10.3)
Chloride: 88 mmol/L — ABNORMAL LOW (ref 98–111)
Chloride: 90 mmol/L — ABNORMAL LOW (ref 98–111)
Creatinine, Ser: 2.05 mg/dL — ABNORMAL HIGH (ref 0.61–1.24)
Creatinine, Ser: 2.03 mg/dL — ABNORMAL HIGH (ref 0.61–1.24)
GFR, Estimated: 33 mL/min — ABNORMAL LOW (ref >60)
GFR, Estimated: 33 mL/min — ABNORMAL LOW (ref >60)
Glucose, Bld: 121 mg/dL — ABNORMAL HIGH (ref 70–99)
Glucose, Bld: 85 mg/dL (ref 70–99)
Potassium: 3.5 mmol/L (ref 3.5–5.1)
Potassium: 3.3 mmol/L — ABNORMAL LOW (ref 3.5–5.1)
Sodium: 133 mmol/L — ABNORMAL LOW (ref 135–145)
Sodium: 133 mmol/L — ABNORMAL LOW (ref 135–145)

## 2023-02-21 LAB — GLUCOSE, CAPILLARY
Glucose-Capillary: 125 mg/dL — ABNORMAL HIGH (ref 70–99)
Glucose-Capillary: 105 mg/dL — ABNORMAL HIGH (ref 70–99)
Glucose-Capillary: 99 mg/dL (ref 70–99)
Glucose-Capillary: 77 mg/dL (ref 70–99)

## 2023-02-21 LAB — HEPATITIS PANEL, ACUTE
HCV Ab: NONREACTIVE
Hep A IgM: NONREACTIVE
Hep B C IgM: NONREACTIVE
Hepatitis B Surface Ag: NONREACTIVE

## 2023-02-21 LAB — HEPARIN LEVEL (UNFRACTIONATED): Heparin Unfractionated: >1.1 IU/mL — ABNORMAL HIGH (ref 0.30–0.70)

## 2023-02-21 LAB — TROPONIN I (HIGH SENSITIVITY)
Troponin I (High Sensitivity): 52 ng/L — ABNORMAL HIGH (ref <18)
Troponin I (High Sensitivity): 54 ng/L — ABNORMAL HIGH (ref <18)
Troponin I (High Sensitivity): 44 ng/L — ABNORMAL HIGH (ref <18)

## 2023-02-21 LAB — APTT: aPTT: 98 seconds — ABNORMAL HIGH (ref 24–36)

## 2023-02-21 LAB — MAGNESIUM
Magnesium: 2 mg/dL (ref 1.7–2.4)
Magnesium: 1.9 mg/dL (ref 1.7–2.4)

## 2023-02-21 LAB — CULTURE, BLOOD (ROUTINE X 2): Special Requests: ADEQUATE

## 2023-02-21 LAB — PHOSPHORUS: Phosphorus: 3.6 mg/dL (ref 2.5–4.6)

## 2023-02-21 LAB — MRSA NEXT GEN BY PCR, NASAL: MRSA by PCR Next Gen: NOT DETECTED

## 2023-02-21 MED ORDER — HEPARIN (PORCINE) 25000 UT/250ML-% IV SOLN
1300.0000 [IU]/h | INTRAVENOUS | Status: DC
  Administered 2023-02-21: 1400 [IU]/h via INTRAVENOUS
  Administered 2023-02-22 – 2023-02-23 (×2): 1300 [IU]/h via INTRAVENOUS
  Filled 2023-02-21 (×3): qty 250

## 2023-02-21 MED ORDER — ORAL CARE MOUTH RINSE
15.0000 mL | OROMUCOSAL | Status: DC
  Administered 2023-02-21 – 2023-02-28 (×26): 15 mL via OROMUCOSAL

## 2023-02-21 MED ORDER — ORAL CARE MOUTH RINSE: 15.0000 mL | OROMUCOSAL | Status: DC | PRN

## 2023-02-21 MED ORDER — MORPHINE SULFATE (PF) 2 MG/ML IV SOLN
1.0000 mg | Freq: Four times a day (QID) | INTRAVENOUS | Status: DC | PRN
  Administered 2023-02-22 – 2023-02-23 (×2): 1 mg via INTRAVENOUS

## 2023-02-21 MED ORDER — MIDODRINE HCL 5 MG PO TABS
ORAL_TABLET | ORAL | Status: AC
  Filled 2023-02-21: qty 1

## 2023-02-21 MED ORDER — LACTATED RINGERS IV BOLUS
500.0000 mL | Freq: Once | INTRAVENOUS | Status: AC
  Administered 2023-02-21: 500 mL via INTRAVENOUS

## 2023-02-21 MED ORDER — MORPHINE SULFATE (PF) 2 MG/ML IV SOLN
1.0000 mg | Freq: Four times a day (QID) | INTRAVENOUS | Status: DC | PRN
  Administered 2023-02-21: 1 mg via INTRAVENOUS
  Filled 2023-02-21: qty 1

## 2023-02-21 MED ORDER — SODIUM CHLORIDE 0.9 % IV SOLN
1.0000 g | Freq: Two times a day (BID) | INTRAVENOUS | Status: AC
  Administered 2023-02-21 – 2023-02-27 (×14): 1 g via INTRAVENOUS
  Filled 2023-02-21 (×14): qty 20

## 2023-02-21 MED ORDER — CHLORHEXIDINE GLUCONATE CLOTH 2 % EX PADS
6.0000 | MEDICATED_PAD | Freq: Every day | CUTANEOUS | Status: DC
  Administered 2023-02-21 – 2023-02-28 (×7): 6 via TOPICAL

## 2023-02-21 NOTE — Progress Notes (Addendum)
ANTICOAGULATION CONSULT NOTE - Follow Up Consult  Pharmacy Consult for Eliquis > Heparin  Indication: atrial fibrillation  Allergies  Allergen Reactions   Diltiazem     rash   Amiodarone Rash    Amiodarone DC'd 02/07/2023 due to concern causing hypothyroidism.     Patient Measurements: Weight: 128.8 kg (283 lb 15.2 oz) Heparin Dosing Weight: 98.5kg   Vital Signs: Temp: 97.6 F (36.4 C) (07/20 1205) Temp Source: Axillary (07/20 1205) BP: 97/51 (07/20 0400) Pulse Rate: 69 (07/20 1230)  Labs: Recent Labs    02/20/23 1500 02/20/23 1529 02/20/23 1927 02/21/23 0341 02/21/23 0845 02/21/23 1045  HGB 19.2* 22.1* 18.7* 16.8  --   --   HCT 59.3* 65.0* 55.0* 51.2  --   --   PLT 230  --   --  209  --   --   CREATININE 2.02* 1.90*  --  2.05*  --   --   TROPONINIHS 31*  --   --   --  52* 54*    Estimated Creatinine Clearance: 39.5 mL/min (A) (by C-G formula based on SCr of 2.05 mg/dL (H)).  Assessment: Patient presenting with septic shock from ESBL E.Coli bacteremia from cholecystitis. On Eliquis PTA, was ordered however not given as patient was on BiPAP. Unknown last dose but at least > 24 hours. CBC stable, HgB 16.8 and PLTS 209.   Potential for surgery, pharmacy consulted to dose heparin.   Goal of Therapy:  Heparin level 0.3-0.7 units/ml aPTT 66-103  Monitor platelets by anticoagulation protocol: Yes   Plan:  No bolus with recent DOAC intake.  Start heparin infusion at 1400 units/hr Check anti-Xa level in 8 hours and daily while on heparin Continue to monitor H&H and platelets  Estill Batten, PharmD, BCCCP  02/21/2023,1:39 PM

## 2023-02-21 NOTE — Progress Notes (Signed)
BLE venous duplex has been completed.   Results can be found under chart review under CV PROC. 02/21/2023 3:52 PM Amonie Wisser RVT, RDMS

## 2023-02-21 NOTE — Progress Notes (Signed)
Reviewed patient with Dr. Marchelle Gearing.  Given concern for acute cholecystitis, will consult general surgery and GI.  Will switch patient from Eliquis to heparin drip just in case patient needs a procedure.  Did discuss with pharmacy.

## 2023-02-21 NOTE — Progress Notes (Signed)
Per MD hold PO meds. Pt on BIPAP

## 2023-02-21 NOTE — Progress Notes (Addendum)
NAME:  Shane Diaz, MRN:  [id number], DOB:  1946-01-31, LOS: 1 ADMISSION DATE:  02/20/2023, CONSULTATION DATE:  02/21/23 REFERRING MD:  Dalene Seltzer, CHIEF COMPLAINT:  shock   History of Present Illness:  Shane Diaz is a 77 y.o. M with PMH of HFpEF, HTN, OSA, CKD 3b, Atrial Fibrillation on Apixaban, venous insufficiency, hypothyroidism,  HL, chronic respiratory failure on home O2 who presented to the ED on 7/19 with acutely worsening shortness of breath and worsening cough.   He was recently admitted for acute CHF exacerbation and discharged home on 7/11 improved after diuresis.   He arrived to the ED on CPAP after having a syncopal event the night before per wife.  CXR was consistent with bilateral pleural effusions without clear infiltrate.   He was hypotensive, so given one L bolus with minimal improvement and started on Levophed.  Labs significant for lactic acid of 2.5, Na 132, K 3.1, creatinine near baseline of 1.9, BNP 187, WBC 15.   In this setting PCCM consulted for admission  Wife reports chest, back, abdomen pain as well as confusion last night after fall.  Confusion has resolved.  Pertinent  Medical History   has a past medical history of Acute diastolic heart failure (HCC) (40/98/1191), Arthritis, Diverticulosis (05/2015), Fatty liver (05/14/2015), History of colon polyps (05/2014), Hyperlipidemia, Hypertension, OSA (obstructive sleep apnea) (08/03/2020), Renal cyst (05/2014), Umbilical hernia, and Varicose veins.   Significant Hospital Events: Including procedures, antibiotic start and stop dates in addition to other pertinent events   7/19 Presented to the ED after syncopal event with increased shortness of breath and hypotension requiring pressors.    Interim History / Subjective:  Overnight events: Patient had placement of arterial line and central venous catheter. Patient was bradycardic requiring atropine.  Patient did have some ST segment elevation noted on telemetry  strip, ordered EKG, did not see true ST segment elevation.  Ordered trop  Patient evaluated bedside this morning.  He is up and talking.  Denies any chest pain or shortness of breath.  He denies any abdominal pain, nausea, or vomiting.  Did get more history with daughter at bedside who states he was getting up from his bed yesterday, and went to sit on the bedside urinal to pee.  Patient did urinate well, and then passed out.  Larey Seat backwards hitting the back of his head.  After that EMS was called.  Patient denies any hematuria, urinary frequency, dysuria, or any other urinary concerns.  No other people in the house are sick.  Daughter is concerned, the patient not making much urine.  Objective   Blood pressure (!) 97/51, pulse 70, temperature (!) 97.4 F (36.3 C), temperature source Axillary, resp. rate (!) 21, weight 128.8 kg, SpO2 94%.  Tmax yesterday at 3:06 PM 101.7.  Presents afebrile.  Blood pressure MAP 77-92 on 6 of Levophed.  Satting at 94-97% on BiPAP FiO2 50%.  Pulse 43-70s    Vent Mode: PCV FiO2 (%):  [50 %-100 %] 50 % Set Rate:  [24 bmp] 24 bmp PEEP:  [6 cmH20] 6 cmH20   Intake/Output Summary (Last 24 hours) at 02/21/2023 0925 Last data filed at 02/21/2023 0600 Gross per 24 hour  Intake 2815.04 ml  Output 480 ml  Net 2335.04 ml   Filed Weights   02/21/23 0354  Weight: 128.8 kg   General: Patient on BiPAP, resting bed, no acute distress Eyes: Pupil is equal, reactive to light, extra ocular motion intact Head: Normocephalic, atraumatic  Cardio: Regular  rate and rhythm, no murmurs, rubs, or gallops Pulmonary: Diminished breath sounds bilaterally Abdomen: Soft,, some discomfort on right upper quadrant palpation, negative Murphy sign Neuro: Alert and orientated x3. CN II-XII intact.  No focal neurological deficits appreciated. Extremities: Bilateral chronic venous changes noted to bilateral lower extremities, 1+ pitting edema  Labs reviewed: Glucose 105-159 Phosphorus  3.6 Magnesium 2.0 BMP sodium 133, potassium 3.5, chloride 88, creatinine 2.5 CBC white count 35, hemoglobin 16.8 MCV 101  Blood cultures: Growing gram-negative rods with ID panel showing E. coli.  Right upper quadrant ultrasound showing stones and sludge in gallbladder wall with no gallbladder wall thickening, no sonographic Murphy sign.  There is some pericholecystic edema.  CT chest abdomen pelvis showing gallbladder imaging concerning for acute cholecystitis.  Bilateral subpleural consolidation within both lung bases representing atelectasis or pneumonia.  Short segment of mild wall thickening adjacent to gallbladder reflecting inflammation of colon.  Chest x-ray showing bilateral pleural effusions with adjacent left basilar atelectasis  Resolved Hospital Problem list     Assessment & Plan:   This is a 77 year old male with a past medical history of HFpEF, hyperlipidemia, hypertension who presents to the emergency department after having syncopal episode.  Patient found to have E. coli bacteremia and refractory hypotension requiring Levophed and admitted for further evaluation and management of E. coli bacteremia and septic shock.  #Septic shock #Colitis #E. coli bacteremia #Leukocytosis Patient evaluated bedside this morning.  Levophed down to 6.  Blood cultures did grow E. coli.  Abdominal imaging did show concern for some colitis.  There was some concern for acute cholecystitis, but on exam this morning, patient did not have any right upper quadrant pain.  He did have very minor tenderness to palpation on right upper quadrant, but no rebound or guarding.  Likely colitis versus acute cholecystitis.  Patient does develop worsening abdominal pain, would consult surgery.  Will hold off for now. -Continue to follow blood cultures -Wean off Levophed as tolerated to keep MAP goal greater than 65 -Continue meropenem day 2 -Can discontinue vancomycin -Monitor white count -Monitor fever  curve -Monitor for further abdominal pain -Blood pressure is improving -Continue midodrine 5 mg every 8 hours  #Micturition syncope Given history, do not think patient wanted to arrhythmia causing his syncopal episode.  Does not sound orthostatic in nature as patient was sitting down when this happened.  Could be related to strong immune response causing sepsis and septic shock.  Could also be related to hydration. -Obtain orthostatics once off Levophed -Monitor on telemetry -No need for echo given most recent echo 1 week ago showing benign valvular etiologies  #Acute on chronic hypoxemic respiratory failure likely secondary to heart failure #HFpEF #OSA Patient is on home oxygen 2-4 L daily per daughter at bedside.  Currently on 4 L nasal cannula.  Do not see PFTs on file, so likely related to OSA versus underlying heart failure.  No acute concern for exacerbation at this time.  Patient does have decreased breath sounds bilaterally.  Patient was on BiPAP this morning, and has been weaned off to nasal cannula.  Will try to wean off down to home levels. -Continue to wean oxygen to 2-4 L goal 92% or greater -No need for repeat echo -Could benefit from SGLT2 inhibitor in the future if kidney function allows -BiPAP nightly  #Atrial fibrillation Patient is rate controlled at this time.  Patient is actually bradycardic.  Currently on Eliquis.  No acute concerns at this time. -continue Eliquis 5  mg twice daily -continue on telemetry  #Chronic venous stasis skin changes These are chronic, do not see any worsening wounds.  Patient does have right mid shin wound appreciated with dressing.  No other wounds appreciated on bilateral lower extremities. Will obtain ABIs today. -Obtain ABIs -Obtain bilateral lower extremity duplexes  #Hypothyroidism Patient did have TSH at 83.7 with free T4 decreased at less than 0.25 on previous hospitalization.  Did start Synthroid back.  Too early to check repeat  TSH and free T4. -Continue Synthroid 100 mcg daily  #Electrolyte derangements #AKI on CKD 4 Patient likely had prerenal AKI causing ischemic ATN in the setting of septic shock.  Patient also had hypokalemia and hyponatremia.  Repleting aggressively.  Magnesium 2.0.  Baseline creatinine seems to be around 1.3-1.5.  Creatinine this morning at 2.05. -Sodium up to 133 -K up to 3.5.  Will continue to replete -Cautious with fluids given history of heart failure -Monitor urine output  #Elevated bilirubin Elevated bilirubin at 2.6.  Could be reactive.  Did also have elevated liver enzymes.  Could be in the setting of shock liver. -Repeat hepatic function panel pending  Best Practice (right click and "Reselect all SmartList Selections" daily)   Diet/type: NPO ok for sips w/ meds DVT prophylaxis: DOAC GI prophylaxis: N/A Lines: N/A Foley:  N/A Code Status:  full code Last date of multidisciplinary goals of care discussion [updated daughter at bedside]  Labs   CBC: Recent Labs  Lab 02/20/23 1500 02/20/23 1529 02/20/23 1927 02/21/23 0341  WBC 15.5*  --   --  35.0*  NEUTROABS 13.9*  --   --   --   HGB 19.2* 22.1* 18.7* 16.8  HCT 59.3* 65.0* 55.0* 51.2  MCV 101.9*  --   --  101.0*  PLT 230  --   --  209    Basic Metabolic Panel: Recent Labs  Lab 02/20/23 1500 02/20/23 1529 02/20/23 1927 02/21/23 0341  NA 130* 132* 133* 133*  K 3.2* 3.1* 2.6* 3.5  CL 76* 85*  --  88*  CO2 32  --   --  31  GLUCOSE 125* 131*  --  121*  BUN 21 26*  --  28*  CREATININE 2.02* 1.90*  --  2.05*  CALCIUM 9.8  --   --  8.8*  MG  --   --   --  2.0  PHOS  --   --   --  3.6   GFR: Estimated Creatinine Clearance: 39.5 mL/min (A) (by C-G formula based on SCr of 2.05 mg/dL (H)). Recent Labs  Lab 02/20/23 1500 02/20/23 1553 02/20/23 2008 02/21/23 0341  PROCALCITON 2.19  --   --   --   WBC 15.5*  --   --  35.0*  LATICACIDVEN  --  2.5* 1.6  --     Liver Function Tests: Recent Labs  Lab  02/20/23 1500  AST 63*  ALT 32  ALKPHOS 129*  BILITOT 2.6*  PROT 9.6*  ALBUMIN 4.2   No results for input(s): "LIPASE", "AMYLASE" in the last 168 hours. No results for input(s): "AMMONIA" in the last 168 hours.  ABG    Component Value Date/Time   PHART 7.469 (H) 02/20/2023 1927   PCO2ART 49.9 (H) 02/20/2023 1927   PO2ART 202 (H) 02/20/2023 1927   HCO3 36.1 (H) 02/20/2023 1927   TCO2 38 (H) 02/20/2023 1927   O2SAT 100 02/20/2023 1927     Coagulation Profile: No results for input(s): "INR", "PROTIME" in  the last 168 hours.  Cardiac Enzymes: No results for input(s): "CKTOTAL", "CKMB", "CKMBINDEX", "TROPONINI" in the last 168 hours.  HbA1C: Hgb A1c MFr Bld  Date/Time Value Ref Range Status  05/08/2020 03:35 AM 5.8 (H) 4.8 - 5.6 % Final    Comment:    (NOTE) Pre diabetes:          5.7%-6.4%  Diabetes:              >6.4%  Glycemic control for   <7.0% adults with diabetes     CBG: Recent Labs  Lab 02/20/23 1925 02/20/23 2323 02/21/23 0313 02/21/23 0730  GLUCAP 159* 118* 125* 105*    Review of Systems:    Positive Symptoms in bold:  Constitutional fevers, chills, weight loss, fatigue, anorexia, malaise  Eyes decreased vision, double vision, eye irritation  Ears, Nose, Mouth, Throat sore throat, trouble swallowing, sinus congestion  Cardiovascular chest pain, paroxysmal nocturnal dyspnea, lower ext edema, palpitations   Respiratory SOB, cough, DOE, hemoptysis, wheezing  Gastrointestinal nausea, vomiting, diarrhea  Genitourinary burning with urination, trouble urinating  Musculoskeletal joint aches, joint swelling, back pain  Integumentary  rashes, skin lesions  Neurological focal weakness, focal numbness, trouble speaking, headaches  Psychiatric depression, anxiety, confusion  Endocrine polyuria, polydipsia, cold intolerance, heat intolerance  Hematologic abnormal bruising, abnormal bleeding, unexplained nose bleeds  Allergic/Immunologic recurrent  infections, hives, swollen lymph nodes     Past Medical History:  He,  has a past medical history of Acute diastolic heart failure (HCC) (78/29/5621), Arthritis, Diverticulosis (05/2015), Fatty liver (05/14/2015), History of colon polyps (05/2014), Hyperlipidemia, Hypertension, OSA (obstructive sleep apnea) (08/03/2020), Renal cyst (05/2014), Umbilical hernia, and Varicose veins.   Surgical History:   Past Surgical History:  Procedure Laterality Date   APPENDECTOMY     childhood   BIOPSY  07/11/2020   Procedure: BIOPSY;  Surgeon: Meryl Dare, MD;  Location: [facility] ENDOSCOPY;  Service: Endoscopy;;   CARDIOVERSION N/A 07/13/2020   Procedure: CARDIOVERSION;  Surgeon: Wendall Stade, MD;  Location[facility] ENDOSCOPY;  Service: Cardiovascular;  Laterality: N/A;   CARDIOVERSION N/A 08/02/2020   Procedure: CARDIOVERSION;  Surgeon: Christell Constant, MD;  Location: [facility] ENDOSCOPY;  Service: Cardiovascular;  Laterality: N/A;   CARDIOVERSION N/A 12/12/2021   Procedure: CARDIOVERSION;  Surgeon: Pricilla Riffle, MD;  Location: [facility] ENDOSCOPY;  Service: Cardiovascular;  Laterality: N/A;   COLONOSCOPY W/ POLYPECTOMY  05/23/2014   COLONOSCOPY WITH PROPOFOL N/A 07/11/2020   Procedure: COLONOSCOPY WITH PROPOFOL;  Surgeon: Meryl Dare, MD;  Location: [facility] ENDOSCOPY;  Service: Endoscopy;  Laterality: N/A;   ESOPHAGOGASTRODUODENOSCOPY (EGD) WITH PROPOFOL N/A 07/11/2020   Procedure: ESOPHAGOGASTRODUODENOSCOPY (EGD) WITH PROPOFOL;  Surgeon: Meryl Dare, MD;  Location: [facility] ENDOSCOPY;  Service: Endoscopy;  Laterality: N/A;   TEE WITHOUT CARDIOVERSION N/A 07/13/2020   Procedure: TRANSESOPHAGEAL ECHOCARDIOGRAM (TEE);  Surgeon: Wendall Stade, MD;  Location: [facility] ENDOSCOPY;  Service: Cardiovascular;  Laterality: N/A;   UMBILICAL HERNIA REPAIR N/A 04/27/2018   Procedure: REPAIR UMBILICAL HERNIA ERAS PATHWAY;  Surgeon: Ovidio Kin, MD;  Location: [facility] ORS;  Service: General;  Laterality: N/A;   varicose veins      WRIST SURGERY       Social History:   reports that he has never smoked. He has never used smokeless tobacco. He reports current alcohol use. He reports that he does not use drugs.   Family History:  His family history is negative for Colon cancer, Esophageal cancer, Pancreatic cancer, Prostate cancer, Rectal cancer, and Stomach cancer.  Allergies Allergies  Allergen Reactions   Diltiazem     rash   Amiodarone Rash    Amiodarone DC'd 02/07/2023 due to concern causing hypothyroidism.      Home Medications  Prior to Admission medications   Medication Sig Start Date End Date Taking? Authorizing Provider  apixaban (ELIQUIS) 5 MG TABS tablet Take 1 tablet (5 mg total) by mouth 2 (two) times daily. 10/23/22   [name], [name], MD  bumetanide (BUMEX) 2 MG tablet TAKE 2 TABLETS BY MOUTH TWICE A DAY 01/12/23   [name], [name], MD  levothyroxine (SYNTHROID) 100 MCG tablet Take 1 tablet (100 mcg total) by mouth daily at 6 (six) AM. 02/13/23 03/15/23  [name], [name], MD  midodrine (PROAMATINE) 5 MG tablet Take 1 tablet (5 mg total) by mouth 2 (two) times daily with a meal. 02/12/23 03/14/23  [name], [name], MD  nitroGLYCERIN (NITROSTAT) 0.4 MG SL tablet Place 1 tablet (0.4 mg total) under the tongue every 5 (five) minutes x 3 doses as needed for chest pain. 12/04/21   [name], Rachelle Hora, MD     Critical care time: 34 min     Modena Slater, DO Internal medicine resident PGY 2 [phone]

## 2023-02-21 NOTE — Progress Notes (Signed)
PHARMACY - PHYSICIAN COMMUNICATION CRITICAL VALUE ALERT - BLOOD CULTURE IDENTIFICATION (BCID)  TRUE Shane Diaz is an 77 y.o. male who presented to [facility] on 02/20/2023 with a chief complaint of pneumonia and bacteremia previously on zosyn and vancomycin. Bcx: 4/4 E.coli with CTX-M ESBL.   Name of physician (or Provider) Contacted: Dr. Warrick Parisian  Current antibiotics: Zosyn and vancomycin  Changes to prescribed antibiotics recommended:  -I recommend changing zosyn to meropenem given possibility of clinical failure to meropenem 1g IV every 8 hours- accepted  Results for orders placed or performed during the hospital encounter of 02/20/23  Blood Culture ID Panel (Reflexed) (Collected: 02/20/2023  3:53 PM)  Result Value Ref Range   Enterococcus faecalis NOT DETECTED NOT DETECTED   Enterococcus Faecium NOT DETECTED NOT DETECTED   Listeria monocytogenes NOT DETECTED NOT DETECTED   Staphylococcus species NOT DETECTED NOT DETECTED   Staphylococcus aureus (BCID) NOT DETECTED NOT DETECTED   Staphylococcus epidermidis NOT DETECTED NOT DETECTED   Staphylococcus lugdunensis NOT DETECTED NOT DETECTED   Streptococcus species NOT DETECTED NOT DETECTED   Streptococcus agalactiae NOT DETECTED NOT DETECTED   Streptococcus pneumoniae NOT DETECTED NOT DETECTED   Streptococcus pyogenes NOT DETECTED NOT DETECTED   A.calcoaceticus-baumannii NOT DETECTED NOT DETECTED   Bacteroides fragilis NOT DETECTED NOT DETECTED   Enterobacterales DETECTED (A) NOT DETECTED   Enterobacter cloacae complex NOT DETECTED NOT DETECTED   Escherichia coli DETECTED (A) NOT DETECTED   Klebsiella aerogenes NOT DETECTED NOT DETECTED   Klebsiella oxytoca NOT DETECTED NOT DETECTED   Klebsiella pneumoniae NOT DETECTED NOT DETECTED   Proteus species NOT DETECTED NOT DETECTED   Salmonella species NOT DETECTED NOT DETECTED   Serratia marcescens NOT DETECTED NOT DETECTED   Haemophilus influenzae NOT DETECTED NOT DETECTED   Neisseria  meningitidis NOT DETECTED NOT DETECTED   Pseudomonas aeruginosa NOT DETECTED NOT DETECTED   Stenotrophomonas maltophilia NOT DETECTED NOT DETECTED   Candida albicans NOT DETECTED NOT DETECTED   Candida auris NOT DETECTED NOT DETECTED   Candida glabrata NOT DETECTED NOT DETECTED   Candida krusei NOT DETECTED NOT DETECTED   Candida parapsilosis NOT DETECTED NOT DETECTED   Candida tropicalis NOT DETECTED NOT DETECTED   Cryptococcus neoformans/gattii NOT DETECTED NOT DETECTED   CTX-M ESBL DETECTED (A) NOT DETECTED   Carbapenem resistance IMP NOT DETECTED NOT DETECTED   Carbapenem resistance KPC NOT DETECTED NOT DETECTED   Carbapenem resistance NDM NOT DETECTED NOT DETECTED   Carbapenem resist OXA 48 LIKE NOT DETECTED NOT DETECTED   Carbapenem resistance VIM NOT DETECTED NOT DETECTED    Shane Diaz, PharmD. Clinical Pharmacist 02/21/2023 4:05 AM

## 2023-02-21 NOTE — Progress Notes (Signed)
eLink Physician-Brief Progress Note Patient Name: Shane Diaz DOB: 07-07-1946 MRN: [id number]   Date of Service  02/21/2023  HPI/Events of Note  9/10 RUQ pain.   Seen by surgery.  Awaiting HIDA which will not be done until tomorrow.  Off of pressors.  eICU Interventions  Morphine prn for pain He is to wear Bipap tonight If pain does not improve discuss with surgery     Intervention Category Intermediate Interventions: Pain - evaluation and management  Henry Russel, P 02/21/2023, 9:55 PM

## 2023-02-21 NOTE — Consult Note (Signed)
Reason for Consult: Abdominal pain, sepsis Referring Physician: Dr. Clarita Crane Even is an 77 y.o. male.  HPI: Patient is a 77 year old male who comes in secondary to sepsis.  Patient does have a history of hypertension monocyte, chronic kidney disease A-fib on apixaban, venous insufficiency and hypothyroidism.  Patient was chronic respiratory failure on home O2.  Patient came in secondary to shortness of breath that was worsening with cough.  There is concern for CHF exacerbation as he recently was discharged.  On arrival per patient's wife he was having chest, back, abdominal pain as well as confusion after a fall.  Upon evaluation in with his workup patient underwent CT scan as well as ultrasound.  There was concern for thickening of the gallbladder wall and stones within the gallbladder.  Patient also with elevated LFTs.  Patient did have a elevated bilirubin of 1.6 today however this was down .   Patient was started on antibiotics.  General surgery was consulted for further evaluation and management.  Past Medical History:  Diagnosis Date   Acute diastolic heart failure (HCC) 08/03/2020   Arthritis    Diverticulosis 05/2015   Mild, noted on colonosocpy   Fatty liver 05/14/2015   noted on [place] ABD   History of colon polyps 05/2014   sessile in ascending colon, pedunculated in sigmoid colon, diverticulosis   Hyperlipidemia    pt denies   Hypertension    not currently taking medications per MD   OSA (obstructive sleep apnea) 08/03/2020   Renal cyst 05/2014   Small, Left, noted on [place] ABD   Umbilical hernia    Varicose veins     Past Surgical History:  Procedure Laterality Date   APPENDECTOMY     childhood   BIOPSY  07/11/2020   Procedure: BIOPSY;  Surgeon: Meryl Dare, MD;  Location: [facility] ENDOSCOPY;  Service: Endoscopy;;   CARDIOVERSION N/A 07/13/2020   Procedure: CARDIOVERSION;  Surgeon: Wendall Stade, MD;  Location: [facility] ENDOSCOPY;  Service: Cardiovascular;   Laterality: N/A;   CARDIOVERSION N/A 08/02/2020   Procedure: CARDIOVERSION;  Surgeon: Christell Constant, MD;  Location: [facility] ENDOSCOPY;  Service: Cardiovascular;  Laterality: N/A;   CARDIOVERSION N/A 12/12/2021   Procedure: CARDIOVERSION;  Surgeon: Pricilla Riffle, MD;  Location: [facility] ENDOSCOPY;  Service: Cardiovascular;  Laterality: N/A;   COLONOSCOPY W/ POLYPECTOMY  05/23/2014   COLONOSCOPY WITH PROPOFOL N/A 07/11/2020   Procedure: COLONOSCOPY WITH PROPOFOL;  Surgeon: Meryl Dare, MD;  Location: [facility] ENDOSCOPY;  Service: Endoscopy;  Laterality: N/A;   ESOPHAGOGASTRODUODENOSCOPY (EGD) WITH PROPOFOL N/A 07/11/2020   Procedure: ESOPHAGOGASTRODUODENOSCOPY (EGD) WITH PROPOFOL;  Surgeon: Meryl Dare, MD;  Location: [facility] ENDOSCOPY;  Service: Endoscopy;  Laterality: N/A;   TEE WITHOUT CARDIOVERSION N/A 07/13/2020   Procedure: TRANSESOPHAGEAL ECHOCARDIOGRAM (TEE);  Surgeon: Wendall Stade, MD;  Location: [facility] ENDOSCOPY;  Service: Cardiovascular;  Laterality: N/A;   UMBILICAL HERNIA REPAIR N/A 04/27/2018   Procedure: REPAIR UMBILICAL HERNIA ERAS PATHWAY;  Surgeon: Ovidio Kin, MD;  Location: [facility] ORS;  Service: General;  Laterality: N/A;   varicose veins     WRIST SURGERY      Family History  Problem Relation Age of Onset   Colon cancer Neg Hx    Esophageal cancer Neg Hx    Pancreatic cancer Neg Hx    Prostate cancer Neg Hx    Rectal cancer Neg Hx    Stomach cancer Neg Hx     Social History:  reports that he has never smoked.  He has never used smokeless tobacco. He reports current alcohol use. He reports that he does not use drugs.  Allergies:  Allergies  Allergen Reactions   Diltiazem     rash   Amiodarone Rash    Amiodarone DC'd 02/07/2023 due to concern causing hypothyroidism.     Medications: I have reviewed the patient's current medications.  Results for orders placed or performed during the hospital encounter of 02/20/23 (from the past 48 hour(s))  CBC with Differential      Status: Abnormal   Collection Time: 02/20/23  3:00 PM  Result Value Ref Range   WBC 15.5 (H) 4.0 - 10.5 K/uL   RBC 5.82 (H) 4.22 - 5.81 MIL/uL   Hemoglobin 19.2 (H) 13.0 - 17.0 g/dL   HCT 01.0 (H) 27.2 - 53.6 %    Comment: REPEATED TO VERIFY   MCV 101.9 (H) 80.0 - 100.0 fL   MCH 33.0 26.0 - 34.0 pg   MCHC 32.4 30.0 - 36.0 g/dL   RDW 64.4 03.4 - 74.2 %   Platelets 230 150 - 400 K/uL   nRBC 0.0 0.0 - 0.2 %   Neutrophils Relative % 91 %   Neutro Abs 13.9 (H) 1.7 - 7.7 K/uL   Lymphocytes Relative 7 %   Lymphs Abs 1.1 0.7 - 4.0 K/uL   Monocytes Relative 1 %   Monocytes Absolute 0.2 0.1 - 1.0 K/uL   Eosinophils Relative 0 %   Eosinophils Absolute 0.0 0.0 - 0.5 K/uL   Basophils Relative 0 %   Basophils Absolute 0.1 0.0 - 0.1 K/uL   Immature Granulocytes 1 %   Abs Immature Granulocytes 0.18 (H) 0.00 - 0.07 K/uL    Comment: Performed at [facility], [address] [place], [place] [address]  Comprehensive metabolic panel     Status: Abnormal   Collection Time: 02/20/23  3:00 PM  Result Value Ref Range   Sodium 130 (L) 135 - 145 mmol/L   Potassium 3.2 (L) 3.5 - 5.1 mmol/L   Chloride 76 (L) 98 - 111 mmol/L   CO2 32 22 - 32 mmol/L   Glucose, Bld 125 (H) 70 - 99 mg/dL    Comment: Glucose reference range applies only to samples taken after fasting for at least 8 hours.   BUN 21 8 - 23 mg/dL   Creatinine, Ser 8.75 (H) 0.61 - 1.24 mg/dL   Calcium 9.8 8.9 - 64.3 mg/dL   Total Protein 9.6 (H) 6.5 - 8.1 g/dL   Albumin 4.2 3.5 - 5.0 g/dL   AST 63 (H) 15 - 41 U/L   ALT 32 0 - 44 U/L   Alkaline Phosphatase 129 (H) 38 - 126 U/L   Total Bilirubin 2.6 (H) 0.3 - 1.2 mg/dL   GFR, Estimated 33 (L) >60 mL/min    Comment: (NOTE) Calculated using the CKD-EPI Creatinine Equation (2021)    Anion gap 22 (H) 5 - 15    Comment: Electrolytes repeated to confirm. Electrolytes repeated to confirm. Performed at [facility], [address] [place], [place] [address]   Troponin I (High  Sensitivity)     Status: Abnormal   Collection Time: 02/20/23  3:00 PM  Result Value Ref Range   Troponin I (High Sensitivity) 31 (H) <18 ng/L    Comment: (NOTE) Elevated high sensitivity troponin I (hsTnI) values and significant  changes across serial measurements may suggest ACS but many other  chronic and acute conditions are known to elevate hsTnI  results.  Refer to the "Links" section for chest pain algorithms and additional  guidance. Performed at [facility], [address], [place], [place] [address]   Brain natriuretic peptide     Status: Abnormal   Collection Time: 02/20/23  3:00 PM  Result Value Ref Range   B Natriuretic Peptide 187.5 (H) 0.0 - 100.0 pg/mL    Comment: Performed at [facility], [address] [place], [place] [address]  Procalcitonin     Status: None   Collection Time: 02/20/23  3:00 PM  Result Value Ref Range   Procalcitonin 2.19 ng/mL    Comment:        Interpretation: PCT > 2 ng/mL: Systemic infection (sepsis) is likely, unless other causes are known. (NOTE)       Sepsis PCT Algorithm           Lower Respiratory Tract                                      Infection PCT Algorithm    ----------------------------     ----------------------------         PCT < 0.25 ng/mL                PCT < 0.10 ng/mL          Strongly encourage             Strongly discourage   discontinuation of antibiotics    initiation of antibiotics    ----------------------------     -----------------------------       PCT 0.25 - 0.50 ng/mL            PCT 0.10 - 0.25 ng/mL               OR       >80% decrease in PCT            Discourage initiation of                                            antibiotics      Encourage discontinuation           of antibiotics    ----------------------------     -----------------------------         PCT >= 0.50 ng/mL              PCT 0.26 - 0.50 ng/mL               AND       <80% decrease in PCT              Encourage  initiation of                                             antibiotics       Encourage continuation           of antibiotics    ----------------------------     -----------------------------        PCT >= 0.50 ng/mL                  PCT > 0.50 ng/mL  AND         increase in PCT                  Strongly encourage                                      initiation of antibiotics    Strongly encourage escalation           of antibiotics                                     -----------------------------                                           PCT <= 0.25 ng/mL                                                 OR                                        > 80% decrease in PCT                                      Discontinue / Do not initiate                                             antibiotics  Performed at [facility], [address], [place], [place] [address]   I-stat chem 8, ED (not at [facility], [facility] or [facility])     Status: Abnormal   Collection Time: 02/20/23  3:29 PM  Result Value Ref Range   Sodium 132 (L) 135 - 145 mmol/L   Potassium 3.1 (L) 3.5 - 5.1 mmol/L   Chloride 85 (L) 98 - 111 mmol/L   BUN 26 (H) 8 - 23 mg/dL   Creatinine, Ser 6.04 (H) 0.61 - 1.24 mg/dL   Glucose, Bld 540 (H) 70 - 99 mg/dL    Comment: Glucose reference range applies only to samples taken after fasting for at least 8 hours.   Calcium, Ion 0.97 (L) 1.15 - 1.40 mmol/L   TCO2 35 (H) 22 - 32 mmol/L   Hemoglobin 22.1 (HH) 13.0 - 17.0 g/dL   HCT 98.1 (H) 19.1 - 47.8 %   Comment NOTIFIED PHYSICIAN   SARS Coronavirus 2 by RT PCR (hospital order, performed in [facility] hospital lab) *cepheid single result test*     Status: None   Collection Time: 02/20/23  3:30 PM   Specimen: Nasal Swab  Result Value Ref Range   SARS Coronavirus 2 by RT PCR NEGATIVE NEGATIVE    Comment: Performed at [facility], [address], [place], [place] [address]  Blood culture (routine x 2)     Status: None  (Preliminary result)  Collection Time: 02/20/23  3:53 PM   Specimen: BLOOD  Result Value Ref Range   Specimen Description BLOOD SITE NOT SPECIFIED    Special Requests      BOTTLES DRAWN AEROBIC AND ANAEROBIC Blood Culture adequate volume   Culture  Setup Time      GRAM NEGATIVE RODS IN BOTH AEROBIC AND ANAEROBIC BOTTLES CRITICAL RESULT CALLED TO, READ BACK BY AND VERIFIED WITH: J [facility]  02/21/23 MK Performed at [facility], [address] [place], [place] [address]    Culture GRAM NEGATIVE RODS    Report Status PENDING   Lactic acid, plasma     Status: Abnormal   Collection Time: 02/20/23  3:53 PM  Result Value Ref Range   Lactic Acid, Venous 2.5 (HH) 0.5 - 1.9 mmol/L    Comment: CRITICAL RESULT CALLED TO, READ BACK BY AND VERIFIED WITH A.JAMES,RN @1700  02/20/2023 VANG.J Performed at [facility], [address] [place], [place] [address]   Blood Culture ID Panel (Reflexed)     Status: Abnormal   Collection Time: 02/20/23  3:53 PM  Result Value Ref Range   Enterococcus faecalis NOT DETECTED NOT DETECTED   Enterococcus Faecium NOT DETECTED NOT DETECTED   Listeria monocytogenes NOT DETECTED NOT DETECTED   Staphylococcus species NOT DETECTED NOT DETECTED   Staphylococcus aureus (BCID) NOT DETECTED NOT DETECTED   Staphylococcus epidermidis NOT DETECTED NOT DETECTED   Staphylococcus lugdunensis NOT DETECTED NOT DETECTED   Streptococcus species NOT DETECTED NOT DETECTED   Streptococcus agalactiae NOT DETECTED NOT DETECTED   Streptococcus pneumoniae NOT DETECTED NOT DETECTED   Streptococcus pyogenes NOT DETECTED NOT DETECTED   A.calcoaceticus-baumannii NOT DETECTED NOT DETECTED   Bacteroides fragilis NOT DETECTED NOT DETECTED   Enterobacterales DETECTED (A) NOT DETECTED    Comment: Enterobacterales represent a large order of gram negative bacteria, not a single organism. CRITICAL RESULT CALLED TO, READ BACK BY AND VERIFIED WITH: J [facility]   02/21/23 MK    Enterobacter cloacae complex NOT DETECTED NOT DETECTED   Escherichia coli DETECTED (A) NOT DETECTED    Comment: CRITICAL RESULT CALLED TO, READ BACK BY AND VERIFIED WITH: J WYLAND,PHARMD@0350  02/21/23 MK    Klebsiella aerogenes NOT DETECTED NOT DETECTED   Klebsiella oxytoca NOT DETECTED NOT DETECTED   Klebsiella pneumoniae NOT DETECTED NOT DETECTED   Proteus species NOT DETECTED NOT DETECTED   Salmonella species NOT DETECTED NOT DETECTED   Serratia marcescens NOT DETECTED NOT DETECTED   Haemophilus influenzae NOT DETECTED NOT DETECTED   Neisseria meningitidis NOT DETECTED NOT DETECTED   Pseudomonas aeruginosa NOT DETECTED NOT DETECTED   Stenotrophomonas maltophilia NOT DETECTED NOT DETECTED   Candida albicans NOT DETECTED NOT DETECTED   Candida auris NOT DETECTED NOT DETECTED   Candida glabrata NOT DETECTED NOT DETECTED   Candida krusei NOT DETECTED NOT DETECTED   Candida parapsilosis NOT DETECTED NOT DETECTED   Candida tropicalis NOT DETECTED NOT DETECTED   Cryptococcus neoformans/gattii NOT DETECTED NOT DETECTED   CTX-M ESBL DETECTED (A) NOT DETECTED    Comment: CRITICAL RESULT CALLED TO, READ BACK BY AND VERIFIED WITH: J WYLAND,PHARMD@0350  02/21/23 MK (NOTE) Extended spectrum beta-lactamase detected. Recommend a carbapenem as initial therapy.      Carbapenem resistance IMP NOT DETECTED NOT DETECTED   Carbapenem resistance KPC NOT DETECTED NOT DETECTED   Carbapenem resistance NDM NOT DETECTED NOT DETECTED   Carbapenem resist OXA 48 LIKE NOT DETECTED NOT DETECTED   Carbapenem resistance VIM NOT DETECTED NOT DETECTED  Comment: Performed at [facility], [address] [place], [place] [address]  Blood culture (routine x 2)     Status: None (Preliminary result)   Collection Time: 02/20/23  4:02 PM   Specimen: BLOOD  Result Value Ref Range   Specimen Description BLOOD SITE NOT SPECIFIED    Special Requests      BOTTLES DRAWN AEROBIC AND ANAEROBIC  Blood Culture adequate volume   Culture  Setup Time      GRAM NEGATIVE RODS IN BOTH AEROBIC AND ANAEROBIC BOTTLES CRITICAL VALUE NOTED.  VALUE IS CONSISTENT WITH PREVIOUSLY REPORTED AND CALLED VALUE. Performed at [facility], [address], [place], [place] [address]    Culture GRAM NEGATIVE RODS    Report Status PENDING   Glucose, capillary     Status: Abnormal   Collection Time: 02/20/23  7:25 PM  Result Value Ref Range   Glucose-Capillary 159 (H) 70 - 99 mg/dL    Comment: Glucose reference range applies only to samples taken after fasting for at least 8 hours.  I-STAT 7, (LYTES, BLD GAS, ICA, H+H)     Status: Abnormal   Collection Time: 02/20/23  7:27 PM  Result Value Ref Range   pH, Arterial 7.469 (H) 7.35 - 7.45   pCO2 arterial 49.9 (H) 32 - 48 mmHg   pO2, Arterial 202 (H) 83 - 108 mmHg   Bicarbonate 36.1 (H) 20.0 - 28.0 mmol/L   TCO2 38 (H) 22 - 32 mmol/L   O2 Saturation 100 %   Acid-Base Excess 10.0 (H) 0.0 - 2.0 mmol/L   Sodium 133 (L) 135 - 145 mmol/L   Potassium 2.6 (LL) 3.5 - 5.1 mmol/L   Calcium, Ion 1.14 (L) 1.15 - 1.40 mmol/L   HCT 55.0 (H) 39.0 - 52.0 %   Hemoglobin 18.7 (H) 13.0 - 17.0 g/dL   Patient temperature 03.4 F    Sample type ARTERIAL    Comment NOTIFIED PHYSICIAN   Lactic acid, plasma     Status: None   Collection Time: 02/20/23  8:08 PM  Result Value Ref Range   Lactic Acid, Venous 1.6 0.5 - 1.9 mmol/L    Comment: Performed at [facility], [address] [place], [place] [address]  Glucose, capillary     Status: Abnormal   Collection Time: 02/20/23 11:23 PM  Result Value Ref Range   Glucose-Capillary 118 (H) 70 - 99 mg/dL    Comment: Glucose reference range applies only to samples taken after fasting for at least 8 hours.  Glucose, capillary     Status: Abnormal   Collection Time: 02/21/23  3:13 AM  Result Value Ref Range   Glucose-Capillary 125 (H) 70 - 99 mg/dL    Comment: Glucose reference range applies only to samples taken  after fasting for at least 8 hours.  CBC     Status: Abnormal   Collection Time: 02/21/23  3:41 AM  Result Value Ref Range   WBC 35.0 (H) 4.0 - 10.5 K/uL   RBC 5.07 4.22 - 5.81 MIL/uL   Hemoglobin 16.8 13.0 - 17.0 g/dL   HCT 56.3 87.5 - 64.3 %   MCV 101.0 (H) 80.0 - 100.0 fL   MCH 33.1 26.0 - 34.0 pg   MCHC 32.8 30.0 - 36.0 g/dL   RDW 32.9 51.8 - 84.1 %   Platelets 209 150 - 400 K/uL   nRBC 0.0 0.0 - 0.2 %    Comment: Performed at [facility]  Lab, [address], [place], [place] [address]  Basic metabolic panel     Status: Abnormal   Collection Time: 02/21/23  3:41 AM  Result Value Ref Range   Sodium 133 (L) 135 - 145 mmol/L   Potassium 3.5 3.5 - 5.1 mmol/L   Chloride 88 (L) 98 - 111 mmol/L   CO2 31 22 - 32 mmol/L   Glucose, Bld 121 (H) 70 - 99 mg/dL    Comment: Glucose reference range applies only to samples taken after fasting for at least 8 hours.   BUN 28 (H) 8 - 23 mg/dL   Creatinine, Ser 1.91 (H) 0.61 - 1.24 mg/dL   Calcium 8.8 (L) 8.9 - 10.3 mg/dL   GFR, Estimated 33 (L) >60 mL/min    Comment: (NOTE) Calculated using the CKD-EPI Creatinine Equation (2021)    Anion gap 14 5 - 15    Comment: Performed at [facility], [address] [place], [place] [address]  Magnesium     Status: None   Collection Time: 02/21/23  3:41 AM  Result Value Ref Range   Magnesium 2.0 1.7 - 2.4 mg/dL    Comment: Performed at [facility], [address] [place], [place] [address]  Phosphorus     Status: None   Collection Time: 02/21/23  3:41 AM  Result Value Ref Range   Phosphorus 3.6 2.5 - 4.6 mg/dL    Comment: Performed at [facility], [address], [place], [place] [address]  MRSA Next Gen by PCR, Nasal     Status: None   Collection Time: 02/21/23  7:15 AM   Specimen: Nasal Mucosa; Nasal Swab  Result Value Ref Range   MRSA by PCR Next Gen NOT DETECTED NOT DETECTED    Comment: (NOTE) The GeneXpert MRSA Assay (FDA approved for NASAL specimens only), is one  component of a comprehensive MRSA colonization surveillance program. It is not intended to diagnose MRSA infection nor to guide or monitor treatment for MRSA infections. Test performance is not FDA approved in patients less than 2 years old. Performed at [facility], [address] [place], [place] [address]   Glucose, capillary     Status: Abnormal   Collection Time: 02/21/23  7:30 AM  Result Value Ref Range   Glucose-Capillary 105 (H) 70 - 99 mg/dL    Comment: Glucose reference range applies only to samples taken after fasting for at least 8 hours.  Troponin I (High Sensitivity)     Status: Abnormal   Collection Time: 02/21/23  8:45 AM  Result Value Ref Range   Troponin I (High Sensitivity) 52 (H) <18 ng/L    Comment: DELTA CHECK NOTED RESULT CALLED TO, READ BACK BY AND VERIFIED WITH ERIBERTO RUYNA RN @1115  07.20.2024 E.AHMED (NOTE) Elevated high sensitivity troponin I (hsTnI) values and significant  changes across serial measurements may suggest ACS but many other  chronic and acute conditions are known to elevate hsTnI results.  Refer to the "Links" section for chest pain algorithms and additional  guidance. Performed at [facility], [address] [place], [place] [address]   Hepatic function panel     Status: Abnormal   Collection Time: 02/21/23 10:45 AM  Result Value Ref Range   Total Protein 6.8 6.5 - 8.1 g/dL   Albumin 2.7 (L) 3.5 - 5.0 g/dL   AST 39 15 - 41 U/L   ALT 25 0 - 44 U/L   Alkaline Phosphatase  86 38 - 126 U/L   Total Bilirubin 1.6 (H) 0.3 - 1.2 mg/dL   Bilirubin, Direct 0.6 (H) 0.0 - 0.2 mg/dL   Indirect Bilirubin 1.0 (H) 0.3 - 0.9 mg/dL    Comment: Performed at [facility], [address], [place], [place] [address]  Troponin I (High Sensitivity)     Status: Abnormal   Collection Time: 02/21/23 10:45 AM  Result Value Ref Range   Troponin I (High Sensitivity) 54 (H) <18 ng/L    Comment: DELTA CHECK NOTED RESULT CALLED TO, READ  BACK BY AND VERIFIED WITH READ Isac Sarna RN @1207  07.20.2024 E.AHMED (NOTE) Elevated high sensitivity troponin I (hsTnI) values and significant  changes across serial measurements may suggest ACS but many other  chronic and acute conditions are known to elevate hsTnI results.  Refer to the "Links" section for chest pain algorithms and additional  guidance. Performed at [facility], [address] [place], [place] [address]   Glucose, capillary     Status: None   Collection Time: 02/21/23 12:04 PM  Result Value Ref Range   Glucose-Capillary 99 70 - 99 mg/dL    Comment: Glucose reference range applies only to samples taken after fasting for at least 8 hours.    US Abdomen Limited RUQ (LIVER/GB)  Result Date: 02/20/2023 CLINICAL DATA:  Cholecystitis. EXAM: ULTRASOUND ABDOMEN LIMITED RIGHT UPPER QUADRANT COMPARISON:  02/20/2023. FINDINGS: Gallbladder: Stones and sludge are present within the gallbladder. No gallbladder wall thickening by measurement. There is questionable mild pericholecystic edema. No sonographic Murphy sign noted by sonographer. Common bile duct: Diameter: 4.7 mm Liver: No focal lesion identified. The liver has a slightly nodular contour inferiorly suggesting underlying cirrhosis increased parenchymal echogenicity. Portal vein is patent on color Doppler imaging with normal direction of blood flow towards the liver. Other: None. IMPRESSION: 1. Stones and sludge in the gallbladder with no gallbladder wall thickening. There is questionable pericholecystic edema. The sonographer reports no sonographic Murphy sign. Correlate clinically to exclude acute cholecystitis. 2. Hepatic steatosis. Electronically Signed   By: Thornell Sartorius M.D.   On: 02/20/2023 23:42   CT CHEST ABDOMEN PELVIS WO CONTRAST  Result Date: 02/20/2023 CLINICAL DATA:  Shortness of breath. Syncope. Chest pain. Sepsis suspected. EXAM: CT CHEST, ABDOMEN AND PELVIS WITHOUT CONTRAST TECHNIQUE: Multidetector CT  imaging of the chest, abdomen and pelvis was performed following the standard protocol without IV contrast. RADIATION DOSE REDUCTION: This exam was performed according to the departmental dose-optimization program which includes automated exposure control, adjustment of the mA and/or kV according to patient size and/or use of iterative reconstruction technique. COMPARISON:  CT angio chest from 03/18/2021 FINDINGS: CT CHEST FINDINGS Cardiovascular: Heart size and mediastinal contours are unremarkable. Aortic atherosclerosis and coronary artery calcifications. No pericardial effusion. Mediastinum/Nodes: Thyroid gland, trachea, and esophagus appear normal. No enlarged the mediastinal or axillary lymph nodes. Lungs/Pleura: Lungs are hypoinflated. No pleural effusion. Bilateral subpleural consolidation is identified within both lung bases. Subsegmental atelectasis noted within the lingula. No signs of interstitial edema. No pneumothorax. Musculoskeletal: Spondylosis within the thoracic spine. No acute or suspicious osseous findings. Similar to the previous exam there is bilateral chest wall areas of increased soft tissue. On the right this measures 6.6 x 2.9 by 5.4 cm, image 36/3 and image 34/6. This is not appreciably changed from 07/14/2020. CT ABDOMEN PELVIS FINDINGS Hepatobiliary: No focal liver abnormality identified. Gallbladder appears distended and is filled with multiple tiny stones clustered in the fundus. There is pericholecystic inflammatory  fat stranding identified. Gallbladder wall thickness is suboptimally evaluated. No signs of bile duct dilatation. Pancreas: Unremarkable. No pancreatic ductal dilatation or surrounding inflammatory changes. Spleen: Normal in size without focal abnormality. Adrenals/Urinary Tract: Normal adrenal glands. Simple appearing cyst arises off the upper pole of the left kidney measuring 2.2 cm, image 64/3. No follow-up imaging recommended. No nephrolithiasis or signs of  obstructive uropathy. Urinary bladder appears normal. Stomach/Bowel: Stomach is nondistended. The appendix is surgically absent. There is a short segment of mild wall thickening adjacent to the gallbladder which may reflect secondary inflammation of the colon, image 80/3. No pathologic dilatation of the large or small bowel loops. Vascular/Lymphatic: Aortic atherosclerosis. No abdominal adenopathy. Borderline left common iliac lymph node measures 1 cm, image 98/3. Right common iliac node measures 0.9 cm. Reproductive: TURP defect identified within the prostate. Other: Fat containing bilateral inguinal hernias. No ascites or focal fluid collections. No signs of pneumoperitoneum. Musculoskeletal: No acute or significant osseous findings. IMPRESSION: 1. Gallbladder appears distended and is filled with multiple tiny stones clustered in the fundus. There is pericholecystic inflammatory fat stranding identified. Gallbladder wall thickness is suboptimally evaluated. Findings are concerning for acute cholecystitis. Consider further evaluation with right upper quadrant ultrasound. 2. Bilateral subpleural consolidation within both lung bases. This may represent atelectasis or pneumonia. 3. Short segment of mild wall thickening adjacent to the gallbladder may reflect secondary inflammation of the colon. 4. Coronary artery calcifications. 5. Fat containing bilateral inguinal hernias. 6.  Aortic Atherosclerosis (ICD10-I70.0). Electronically Signed   By: Signa Kell M.D.   On: 02/20/2023 18:40   [place] EKG SITE RITE  Result Date: 02/20/2023 If Site Rite image not attached, placement could not be confirmed due to current cardiac rhythm.  DG Chest Port 1 View  Result Date: 02/20/2023 CLINICAL DATA:  Shortness of breath. EXAM: PORTABLE CHEST 1 VIEW COMPARISON:  02/06/2023. FINDINGS: Small bilateral pleural effusions with adjacent left basilar atelectasis. Low lung volumes accentuate the cardiomediastinal silhouette. No  pneumothorax. IMPRESSION: Small bilateral pleural effusions with adjacent left basilar atelectasis. Electronically Signed   By: Orvan Falconer M.D.   On: 02/20/2023 15:13    Review of Systems  Constitutional:  Negative for chills and fever.  HENT:  Negative for ear discharge, hearing loss and sore throat.   Eyes:  Negative for discharge.  Respiratory:  Negative for cough and shortness of breath.   Cardiovascular:  Negative for chest pain and leg swelling.  Gastrointestinal:  Negative for abdominal pain, constipation, diarrhea, nausea and vomiting.  Musculoskeletal:  Negative for myalgias and neck pain.  Skin:  Negative for rash.  Allergic/Immunologic: Negative for environmental allergies.  Neurological:  Negative for dizziness and seizures.  Hematological:  Does not bruise/bleed easily.  Psychiatric/Behavioral:  Negative for suicidal ideas.   All other systems reviewed and are negative.  Blood pressure (!) 97/51, pulse 69, temperature 97.6 F (36.4 C), temperature source Axillary, resp. rate 20, weight 128.8 kg, SpO2 94%. Physical Exam Constitutional:      Appearance: He is well-developed.     Comments: Conversant No acute distress  HENT:     Head: Normocephalic and atraumatic.  Eyes:     General: Lids are normal. No scleral icterus.    Pupils: Pupils are equal, round, and reactive to light.     Comments: Pupils are equal round and reactive No lid lag Moist conjunctiva  Neck:     Thyroid: No thyromegaly.     Trachea: No tracheal tenderness.     Comments: No  cervical lymphadenopathy Cardiovascular:     Rate and Rhythm: Normal rate and regular rhythm.     Heart sounds: No murmur heard. Pulmonary:     Effort: Pulmonary effort is normal.     Breath sounds: Normal breath sounds. No wheezing or rales.  Abdominal:     Tenderness: There is abdominal tenderness in the right upper quadrant. Positive signs include Murphy's sign.     Hernia: No hernia is present.  Musculoskeletal:      Cervical back: Normal range of motion and neck supple.  Skin:    General: Skin is warm.     Findings: No rash.     Nails: There is no clubbing.     Comments: Normal skin turgor  Neurological:     Mental Status: He is alert and oriented to person, place, and time.     Comments: Normal gait and station  Psychiatric:        Mood and Affect: Mood normal.        Thought Content: Thought content normal.        Judgment: Judgment normal.     Comments: Appropriate affect     Assessment/Plan: 77 year old male with possible cholecystitis. Septic shock History of A-fib Hypertension OSA DVTs CAD Chronic heart failure  1.  At this point recommend HIDA scan to evaluate for possible cholecystitis as this is equivocal on CT scan and ultrasound.  If HIDA scan is positive for cholecystitis would recommend IR cholecystostomy tube. 2.  Agree with continued antibiotics. 3.  Due to patient's elevated T. bili would recommend GI consult for possible ERCP needs. 4.  Will follow along.  Axel Filler 02/21/2023, 12:46 PM

## 2023-02-21 NOTE — Progress Notes (Signed)
ST elevation noted at change of shift. MD notified, new orders in. EKG handed to resident.

## 2023-02-21 NOTE — Progress Notes (Signed)
ANTICOAGULATION CONSULT NOTE - Follow Up Consult  Pharmacy Consult for Eliquis > Heparin  Indication: atrial fibrillation  Allergies  Allergen Reactions   Diltiazem     rash   Amiodarone Rash    Amiodarone DC'd 02/07/2023 due to concern causing hypothyroidism.     Patient Measurements: Height: 5\' 8"  (172.7 cm) Weight: 128.8 kg (283 lb 15.2 oz) IBW/kg (Calculated) : 68.4 Heparin Dosing Weight: 98.5kg   Vital Signs: Temp: 99.1 F (37.3 C) (07/20 2000) Temp Source: Oral (07/20 2000) Pulse Rate: 80 (07/20 2238)  Labs: Recent Labs    02/20/23 1500 02/20/23 1529 02/20/23 1927 02/21/23 0341 02/21/23 0845 02/21/23 1045 02/21/23 2109  HGB 19.2* 22.1* 18.7* 16.8  --   --   --   HCT 59.3* 65.0* 55.0* 51.2  --   --   --   PLT 230  --   --  209  --   --   --   APTT  --   --   --   --   --   --  98*  HEPARINUNFRC  --   --   --   --   --   --  >1.10*  CREATININE 2.02* 1.90*  --  2.05*  --   --  2.03*  TROPONINIHS 31*  --   --   --  52* 54* 44*    Estimated Creatinine Clearance: 39.9 mL/min (A) (by C-G formula based on SCr of 2.03 mg/dL (H)).  Assessment: Patient presenting with septic shock from ESBL E.Coli bacteremia from cholecystitis. On Eliquis PTA, was ordered however not given as patient was on BiPAP. Unknown last dose but at least > 24 hours. CBC stable, HgB 16.8 and PLTS 209.   Potential for surgery, pharmacy consulted to dose heparin.   2100 Heparin level >1.1, supratherapeutic on heparin infusion at 1400 units/h. APTT 98, therapeutic. Heparin level likely does not correlate due PTA Eliquis. Will continue to dose based on APTT until they correlate.   No issues with the infusion or signs or symptoms of bleeding per RN.  Goal of Therapy:  Heparin level 0.3-0.7 units/ml aPTT 66-103  Monitor platelets by anticoagulation protocol: Yes   Plan:  Continue heparin infusion at 1400 units/hr Check anti-Xa level and APTT in 8 hours  Continue to monitor H&H, platelets, and  for signs or symptoms of bleeding  Stephenie Acres, PharmD PGY1 Pharmacy Resident 02/21/2023 11:02 PM

## 2023-02-21 NOTE — Progress Notes (Signed)
Pt placed on BIPAP via servo to sleep per order  02/21/23 2239  Vent Select  Invasive or Noninvasive Noninvasive  Adult Vent Y  Adult Ventilator Settings  Vent Type Servo i  Vent Mode PSV;BIPAP  FiO2 (%) 50 %  Pressure Support 6 cmH20 (above peep)  PEEP 5 cmH20  Adult Ventilator Measurements  Peak Airway Pressure 18 L/min  Mean Airway Pressure 10 cmH20  Resp Rate Spontaneous 25 br/min  Resp Rate Total 25 br/min  Exhaled Vt 547 mL  Measured Ve 13.2 L  I:E Ratio Measured 1:2.1  Total PEEP 6 cmH20  SpO2 100 %  VAP Prevention  HOB> 30 Degrees Y  Equipment wiped down Yes  Breath Sounds  Bilateral Breath Sounds Diminished

## 2023-02-21 NOTE — Consult Note (Addendum)
Referring Provider: Dr. Allena Katz  Primary Care Physician:  Ralene Ok, MD Primary Gastroenterologist:  Gentry Fitz   Reason for Consultation: Elevated LFTs, possible acute cholecystitis with E. coli bacteremia  HPI: Shane Diaz is a 77 y.o. [place] male with a past medical history of obesity, hypertension, diastolic CHF, atrial fibrillation on Eliquis, chronic venous insufficiency with chronic bilateral lower extremity wounds, cor pulmonale, OSA, chronic respiratory failure on home oxygen, recurrent DVT, CKD stage IIIb, hypothyroidism,   He was admitted to the hospital 02/06/2023 with progressive fatigue and dyspnea secondary to acute on chronic heart failure.  He required BiPAP, was diuresed and required midodrine for BP support.  Echo showed LVEF 65 to 70% with mildly reduced RV systolic function.  His clinical status stabilized and discharged to SNF was recommended but he deferred.  He was discharged home on 02/12/2023 on Bumetanide 2 mg twice daily, Midodrine 5 mg twice daily and Eliquis was continued.  He developed chest pain and shortness of breath with associated syncopal episode and presented to the ED 02/20/2023 via EMS.  Labs in the ED showed a WBC count of 15.5.  Hemoglobin 19.2. (Baseline hemoglobin 15.8 on 02/10/2023 ).  Sodium 130.  Potassium 3.2.  BUN 21.  Creatinine 2.02 up from 1.72.  Total bili 2.6.  Alk phos 129.  AST 63.  ALT 32.  Ion gap 22.  Troponin 31.  BNP 187.5.  Procalcitonin 2.19.  Blood cultures positive E. coli and Enterobacterales bacteremia.  RUQ sonogram showed stones and sludge in the gallbladder without CBD dilatation, the liver with slightly nodule contour suggestive of possible underlying cirrhosis with hepatic steatosis, the portal vein was patent. Chest/abdominal/pelvic CT without contrast the gallbladder distended filled with multiple tiny stones clustered in the fundus with pericholecystic inflammatory fat stranding concerning for acute cholecystitis.  A short  segment of mild wall thickening adjacent to the gallbladder was noted, possibly reflecting inflammation of the colon.  Labs today showed a WBC count of 35.0.  Hemoglobin 16.8.  Hematocrit 51.2.  MCV 101.0.  Sodium 133.  BUN 28.  Creatinine 2.05.  Magnesium 2.0.  Total bili 1.6.  Alk phos 86.  AST 39.  ALT 25.  Acute hepatitis panel, lipase and amylase levels ordered.   Patient is on Eliquis for atrial fibrillation.  Eliquis was ordered at time of admission but was not given to the patient as confirmed by [facility] pharmacist.  Eliquis order was discontinued today.  No family was present at this time.  I attempted to call the patient's daughter who speaks [place], however, she did not answer her phone.   ECHO 02/08/2023: IMPRESSIONS Left ventricular ejection fraction, by estimation, is 65 to 70%. The left ventricle has normal function. The left ventricle has no regional wall motion abnormalities. There is mild asymmetric left ventricular hypertrophy of the basal segment. Left ventricular diastolic parameters are consistent with Grade II diastolic dysfunction (pseudonormalization). 1. Right ventricular systolic function is mildly reduced. The right ventricular size is normal. Tricuspid regurgitation signal is inadequate for assessing PA pressure. 2. 3. Left atrial size was upper normal. 4. The mitral valve is grossly normal. Trivial mitral valve regurgitation. The aortic valve is tricuspid. There is mild calcification of the aortic valve. Aortic valve regurgitation is not visualized. Aortic valve sclerosis is present, with no evidence of aortic valve stenosis. 5. The inferior vena cava is normal in size with greater than 50% respiratory variability, suggesting right atrial pressure of 3 mmHg  GI PROCEDURES:  EGD 07/11/2020  by Dr. Russella Dar as an inpatient secondary to IDA: - Normal esophagus.  - Gastritis. Biopsied.  - Normal duodenal bulb and second portion of the duodenum. Biopsied.  Colonoscopy  07/11/2020: - Preparation of the colon was fair.  - Moderate diverticulosis in the left colon. There was no evidence of diverticular bleeding. - Internal hemorrhoids.  - The examination was otherwise normal on direct and retroflexion views. - No specimens collected  Past Medical History:  Diagnosis Date   Acute diastolic heart failure (HCC) 08/03/2020   Arthritis    Diverticulosis 05/2015   Mild, noted on colonosocpy   Fatty liver 05/14/2015   noted on [place] ABD   History of colon polyps 05/2014   sessile in ascending colon, pedunculated in sigmoid colon, diverticulosis   Hyperlipidemia    pt denies   Hypertension    not currently taking medications per MD   OSA (obstructive sleep apnea) 08/03/2020   Renal cyst 05/2014   Small, Left, noted on [place] ABD   Umbilical hernia    Varicose veins     Past Surgical History:  Procedure Laterality Date   APPENDECTOMY     childhood   BIOPSY  07/11/2020   Procedure: BIOPSY;  Surgeon: Meryl Dare, MD;  Location: [facility] ENDOSCOPY;  Service: Endoscopy;;   CARDIOVERSION N/A 07/13/2020   Procedure: CARDIOVERSION;  Surgeon: Wendall Stade, MD;  Location: [facility] ENDOSCOPY;  Service: Cardiovascular;  Laterality: N/A;   CARDIOVERSION N/A 08/02/2020   Procedure: CARDIOVERSION;  Surgeon: Christell Constant, MD;  Location: [facility] ENDOSCOPY;  Service: Cardiovascular;  Laterality: N/A;   CARDIOVERSION N/A 12/12/2021   Procedure: CARDIOVERSION;  Surgeon: Pricilla Riffle, MD;  Location: [facility] ENDOSCOPY;  Service: Cardiovascular;  Laterality: N/A;   COLONOSCOPY W/ POLYPECTOMY  05/23/2014   COLONOSCOPY WITH PROPOFOL N/A 07/11/2020   Procedure: COLONOSCOPY WITH PROPOFOL;  Surgeon: Meryl Dare, MD;  Location: [facility] ENDOSCOPY;  Service: Endoscopy;  Laterality: N/A;   ESOPHAGOGASTRODUODENOSCOPY (EGD) WITH PROPOFOL N/A 07/11/2020   Procedure: ESOPHAGOGASTRODUODENOSCOPY (EGD) WITH PROPOFOL;  Surgeon: Meryl Dare, MD;  Location: [facility] ENDOSCOPY;  Service: Endoscopy;   Laterality: N/A;   TEE WITHOUT CARDIOVERSION N/A 07/13/2020   Procedure: TRANSESOPHAGEAL ECHOCARDIOGRAM (TEE);  Surgeon: Wendall Stade, MD;  Location: [facility] ENDOSCOPY;  Service: Cardiovascular;  Laterality: N/A;   UMBILICAL HERNIA REPAIR N/A 04/27/2018   Procedure: REPAIR UMBILICAL HERNIA ERAS PATHWAY;  Surgeon: Ovidio Kin, MD;  Location: [facility] ORS;  Service: General;  Laterality: N/A;   varicose veins     WRIST SURGERY      Prior to Admission medications   Medication Sig Start Date End Date Taking? Authorizing Provider  apixaban (ELIQUIS) 5 MG TABS tablet Take 1 tablet (5 mg total) by mouth 2 (two) times daily. 10/23/22  Yes [name], [name], MD  bisacodyl 5 MG EC tablet Take 10 mg by mouth daily as needed for moderate constipation.   Yes [provider]  bismuth subsalicylate (PEPTO BISMOL) 262 MG/15ML suspension Take 30 mLs by mouth as needed for diarrhea or loose stools or indigestion.   Yes [provider]  bumetanide (BUMEX) 2 MG tablet TAKE 2 TABLETS BY MOUTH TWICE A DAY 01/12/23  Yes [name], [name], MD  levothyroxine (SYNTHROID) 100 MCG tablet Take 1 tablet (100 mcg total) by mouth daily at 6 (six) AM. 02/13/23 03/15/23 Yes [name], [name], MD  midodrine (PROAMATINE) 5 MG tablet Take 1 tablet (5 mg total) by mouth 2 (two) times daily with a meal. 02/12/23 03/14/23 Yes [name],  [name], MD  nitroGLYCERIN (NITROSTAT) 0.4 MG SL tablet Place 1 tablet (0.4 mg total) under the tongue every 5 (five) minutes x 3 doses as needed for chest pain. 12/04/21  Yes [name], [name], MD    Current Facility-Administered Medications  Medication Dose Route Frequency Provider Last Rate Last Admin   apixaban (ELIQUIS) tablet 5 mg  5 mg Oral BID Lorin Glass, MD       atropine 1 MG/10ML injection 0.5 mg  0.5 mg Intravenous Once PRN Migdalia Dk, MD       Chlorhexidine Gluconate Cloth 2 % PADS 6 each  6 each Topical Q0600 Lorin Glass, MD   6 each at 02/21/23 1100   docusate  sodium (COLACE) capsule 100 mg  100 mg Oral BID PRN Lorin Glass, MD       lactated ringers bolus 500 mL  500 mL Intravenous Once Kalman Shan, MD       lactated ringers infusion   Intravenous Continuous Lorin Glass, MD 100 mL/hr at 02/21/23 0900 Infusion Verify at 02/21/23 0900   levothyroxine (SYNTHROID) tablet 100 mcg  100 mcg Oral Q0600 Lorin Glass, MD       meropenem [facility]) 1 g in sodium chloride 0.9 % 100 mL IVPB  1 g Intravenous Q12H Ogan, Okoronkwo U, MD 200 mL/hr at 02/21/23 1037 1 g at 02/21/23 1037   midodrine (PROAMATINE) tablet 5 mg  5 mg Oral Q8H Lorin Glass, MD       norepinephrine (LEVOPHED) 16 mg in (0.064 mg/mL) premix infusion  0-40 mcg/min Intravenous Titrated Migdalia Dk, MD 3.75 mL/hr at 02/21/23 0900 4 mcg/min at 02/21/23 0900   Oral care mouth rinse  15 mL Mouth Rinse 4 times per day Lorin Glass, MD   15 mL at 02/21/23 1200   Oral care mouth rinse  15 mL Mouth Rinse PRN Lorin Glass, MD       polyethylene glycol (MIRALAX / GLYCOLAX) packet 17 g  17 g Oral Daily PRN Lorin Glass, MD        Allergies as of 02/20/2023 - Review Complete 02/20/2023  Allergen Reaction Noted   Diltiazem  09/17/2021   Amiodarone Rash 10/30/2020    Family History  Problem Relation Age of Onset   Colon cancer Neg Hx    Esophageal cancer Neg Hx    Pancreatic cancer Neg Hx    Prostate cancer Neg Hx    Rectal cancer Neg Hx    Stomach cancer Neg Hx     Social History   Socioeconomic History   Marital status: Unknown    Spouse name: Not on file   Number of children: 1   Years of education: Not on file   Highest education level: High school graduate  Occupational History   Occupation: retired  Tobacco Use   Smoking status: Never   Smokeless tobacco: Never  Vaping Use   Vaping status: Never Used  Substance and Sexual Activity   Alcohol use: Yes    Alcohol/week: 0.0 standard drinks of alcohol    Comment: occassional wine, martini   Drug  use: No   Sexual activity: Not on file  Other Topics Concern   Not on file  Social History Narrative   Not on file   Social Determinants of Health   Financial Resource Strain: Low Risk  (02/09/2023)   Overall Financial Resource Strain (CARDIA)    Difficulty of Paying Living Expenses: Not very  hard  Food Insecurity: No Food Insecurity (02/07/2023)   Hunger Vital Sign    Worried About Running Out of Food in the Last Year: Never true    Ran Out of Food in the Last Year: Never true  Transportation Needs: No Transportation Needs (02/07/2023)   PRAPARE - Administrator, Civil Service (Medical): No    Lack of Transportation (Non-Medical): No  Physical Activity: Not on file  Stress: Not on file  Social Connections: Not on file  Intimate Partner Violence: Not At Risk (02/07/2023)   Humiliation, Afraid, Rape, and Kick questionnaire    Fear of Current or Ex-Partner: No    Emotionally Abused: No    Physically Abused: No    Sexually Abused: No    Review of Systems: Unable to obtain ROS as patient is currently on BIPAP.  Physical Exam: Vital signs in last 24 hours: Temp:  [97.2 F (36.2 C)-101.7 F (38.7 C)] 97.6 F (36.4 C) (07/20 1205) Pulse Rate:  [33-98] 70 (07/20 0900) Resp:  [8-32] 21 (07/20 0900) BP: (68-116)/(30-82) 97/51 (07/20 0400) SpO2:  [92 %-100 %] 94 % (07/20 0900) Arterial Line BP: (109-162)/(54-82) 144/68 (07/20 0900) FiO2 (%):  [50 %-100 %] 50 % (07/20 0738) Weight:  [128.8 kg] 128.8 kg (07/20 0354) Last BM Date :  (PTA) General: Critically ill-appearing 77 year old male on BiPAP. Head:  Normocephalic and atraumatic. Eyes: Pupils 1 mm and sluggish to react.  No scleral icterus. Conjunctiva pink. Ears:  Normal auditory acuity. Nose:  No deformity, discharge or lesions. Mouth:  Dentition intact. No ulcers or lesions.  Neck:  Supple. No lymphadenopathy or thyromegaly.  Lungs: Coarse breath sounds throughout.  No wheezes, rhonchi or crackles.  Heart: Rate and  rhythm, no murmurs. Abdomen: Obese abdomen, tenderness to the RUQ (when patient asked if he was having any pain he placed his hand on top of his RUQ area) Rectal: Deferred. Musculoskeletal:  Symmetrical without gross deformities.  Pulses:  DP pulses nonpalpable.  Extremities: Bilateral lower extremities with gross edema, stasis dermatitis. Neurologic: Nonconversant on BiPAP.  Follows simple commands. Skin: Chronic wounds to the lower extremities with dressings intact. Psych: Nonagitated.  Intake/Output from previous day: 07/19 0701 - 07/20 0700 In: 2815 [I.V.:1066.3; IV Piggyback:1748.8] Out: 480 [Urine:480] Intake/Output this shift: Total I/O In: 307.2 [I.V.:307.2] Out: 500 [Urine:500]  Lab Results: Recent Labs    02/20/23 1500 02/20/23 1529 02/20/23 1927 02/21/23 0341  WBC 15.5*  --   --  35.0*  HGB 19.2* 22.1* 18.7* 16.8  HCT 59.3* 65.0* 55.0* 51.2  PLT 230  --   --  209   BMET Recent Labs    02/20/23 1500 02/20/23 1529 02/20/23 1927 02/21/23 0341  NA 130* 132* 133* 133*  K 3.2* 3.1* 2.6* 3.5  CL 76* 85*  --  88*  CO2 32  --   --  31  GLUCOSE 125* 131*  --  121*  BUN 21 26*  --  28*  CREATININE 2.02* 1.90*  --  2.05*  CALCIUM 9.8  --   --  8.8*   LFT Recent Labs    02/21/23 1045  PROT 6.8  ALBUMIN 2.7*  AST 39  ALT 25  ALKPHOS 86  BILITOT 1.6*  BILIDIR 0.6*  IBILI 1.0*   PT/INR No results for input(s): "LABPROT", "INR" in the last 72 hours. Hepatitis Panel No results for input(s): "HEPBSAG", "HCVAB", "HEPAIGM", "HEPBIGM" in the last 72 hours.    Studies/Results: US Abdomen Limited RUQ (LIVER/GB)  Result Date: 02/20/2023 CLINICAL DATA:  Cholecystitis. EXAM: ULTRASOUND ABDOMEN LIMITED RIGHT UPPER QUADRANT COMPARISON:  02/20/2023. FINDINGS: Gallbladder: Stones and sludge are present within the gallbladder. No gallbladder wall thickening by measurement. There is questionable mild pericholecystic edema. No sonographic Murphy sign noted by  sonographer. Common bile duct: Diameter: 4.7 mm Liver: No focal lesion identified. The liver has a slightly nodular contour inferiorly suggesting underlying cirrhosis increased parenchymal echogenicity. Portal vein is patent on color Doppler imaging with normal direction of blood flow towards the liver. Other: None. IMPRESSION: 1. Stones and sludge in the gallbladder with no gallbladder wall thickening. There is questionable pericholecystic edema. The sonographer reports no sonographic Murphy sign. Correlate clinically to exclude acute cholecystitis. 2. Hepatic steatosis. Electronically Signed   By: Thornell Sartorius M.D.   On: 02/20/2023 23:42   CT CHEST ABDOMEN PELVIS WO CONTRAST  Result Date: 02/20/2023 CLINICAL DATA:  Shortness of breath. Syncope. Chest pain. Sepsis suspected. EXAM: CT CHEST, ABDOMEN AND PELVIS WITHOUT CONTRAST TECHNIQUE: Multidetector CT imaging of the chest, abdomen and pelvis was performed following the standard protocol without IV contrast. RADIATION DOSE REDUCTION: This exam was performed according to the departmental dose-optimization program which includes automated exposure control, adjustment of the mA and/or kV according to patient size and/or use of iterative reconstruction technique. COMPARISON:  CT angio chest from 03/18/2021 FINDINGS: CT CHEST FINDINGS Cardiovascular: Heart size and mediastinal contours are unremarkable. Aortic atherosclerosis and coronary artery calcifications. No pericardial effusion. Mediastinum/Nodes: Thyroid gland, trachea, and esophagus appear normal. No enlarged the mediastinal or axillary lymph nodes. Lungs/Pleura: Lungs are hypoinflated. No pleural effusion. Bilateral subpleural consolidation is identified within both lung bases. Subsegmental atelectasis noted within the lingula. No signs of interstitial edema. No pneumothorax. Musculoskeletal: Spondylosis within the thoracic spine. No acute or suspicious osseous findings. Similar to the previous exam  there is bilateral chest wall areas of increased soft tissue. On the right this measures 6.6 x 2.9 by 5.4 cm, image 36/3 and image 34/6. This is not appreciably changed from 07/14/2020. CT ABDOMEN PELVIS FINDINGS Hepatobiliary: No focal liver abnormality identified. Gallbladder appears distended and is filled with multiple tiny stones clustered in the fundus. There is pericholecystic inflammatory fat stranding identified. Gallbladder wall thickness is suboptimally evaluated. No signs of bile duct dilatation. Pancreas: Unremarkable. No pancreatic ductal dilatation or surrounding inflammatory changes. Spleen: Normal in size without focal abnormality. Adrenals/Urinary Tract: Normal adrenal glands. Simple appearing cyst arises off the upper pole of the left kidney measuring 2.2 cm, image 64/3. No follow-up imaging recommended. No nephrolithiasis or signs of obstructive uropathy. Urinary bladder appears normal. Stomach/Bowel: Stomach is nondistended. The appendix is surgically absent. There is a short segment of mild wall thickening adjacent to the gallbladder which may reflect secondary inflammation of the colon, image 80/3. No pathologic dilatation of the large or small bowel loops. Vascular/Lymphatic: Aortic atherosclerosis. No abdominal adenopathy. Borderline left common iliac lymph node measures 1 cm, image 98/3. Right common iliac node measures 0.9 cm. Reproductive: TURP defect identified within the prostate. Other: Fat containing bilateral inguinal hernias. No ascites or focal fluid collections. No signs of pneumoperitoneum. Musculoskeletal: No acute or significant osseous findings. IMPRESSION: 1. Gallbladder appears distended and is filled with multiple tiny stones clustered in the fundus. There is pericholecystic inflammatory fat stranding identified. Gallbladder wall thickness is suboptimally evaluated. Findings are concerning for acute cholecystitis. Consider further evaluation with right upper quadrant  ultrasound. 2. Bilateral subpleural consolidation within both lung bases. This may represent atelectasis or pneumonia. 3. Short  segment of mild wall thickening adjacent to the gallbladder may reflect secondary inflammation of the colon. 4. Coronary artery calcifications. 5. Fat containing bilateral inguinal hernias. 6.  Aortic Atherosclerosis (ICD10-I70.0). Electronically Signed   By: Signa Kell M.D.   On: 02/20/2023 18:40   [place] EKG SITE RITE  Result Date: 02/20/2023 If Site Rite image not attached, placement could not be confirmed due to current cardiac rhythm.  DG Chest Port 1 View  Result Date: 02/20/2023 CLINICAL DATA:  Shortness of breath. EXAM: PORTABLE CHEST 1 VIEW COMPARISON:  02/06/2023. FINDINGS: Small bilateral pleural effusions with adjacent left basilar atelectasis. Low lung volumes accentuate the cardiomediastinal silhouette. No pneumothorax. IMPRESSION: Small bilateral pleural effusions with adjacent left basilar atelectasis. Electronically Signed   By: Orvan Falconer M.D.   On: 02/20/2023 15:13    IMPRESSION/PLAN:  78 year old male admitted with septic shock secondary to E. coli sepsis suspected due to cholecystitis. Elevated LFTs. RUQ sonogram showed stones and sludge in the gallbladder without CBD dilatation. Chest/abdominal/pelvic CT without contrast showed a gallbladder distended filled with multiple tiny stones clustered in the fundus with pericholecystic inflammatory fat stranding concerning for acute cholecystitis.  A short segment of mild wall thickening adjacent to the gallbladder was noted, possibly reflecting inflammation of the colon.  Received IV Vanco x 1. On Meropenem , Cefepime and Flagyl IV. On Midodrine.  Levophed weaned off. -NPO -Continue Meropenem IV -Seen by general surgery, HIDA ordered -Case reviewed by our biliary specialist Dr. Leone Payor. CT and RUQ sonogram without evidence of biliary ductal dilatation/choledocholithiasis therefore no recommendations for  MRCP or ERCP at this time -May require percutaneous cholecystostomy tube -CBC, hepatic panel, BMP in a.m. -Await further recommendations per Dr. Lavon Paganini  Acute respiratory failure on BiPAP.  Chest x-ray showed bilateral pleural effusions with left bibasilar atelectasis.  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  AKI. Cr 1.90 -> 2.05.  Atrial fibrillation.  Last dose of Eliquis was given  -Hold Eliquis  Chronic diastolic CHF. Echo showed LVEF 65 to 70% with mildly reduced RV systolic function.    Arnaldo Natal  02/21/2023, 1:43PM    Attending physician's note  I have taken a history, reviewed the chart and examined the patient. I performed a substantive portion of this encounter, including complete performance of at least one of the key components, in conjunction with the APP. I agree with the APP's note, impression and recommendations.   77 year old male with E. coli sepsis, suspected acute cholecystitis He has mild elevation in total bilirubin, could be in the setting of acute cholecystitis, no signs of biliary duct dilation or choledocholithiasis on imaging.  He has multiple cholelithiasis with pericholecystic inflammatory fat stranding suggestive of acute cystitis  Patient has multiple comorbid conditions.  General surgery ordered HIDA scan His presentation is not consistent with biliary obstruction or acute cholangitis Discussed with ERCP backup, Dr. Leone Payor, no plan for ERCP  If patient is not a surgical candidate, please consult IR for cholecystostomy tube   The patient was provided an opportunity to ask questions and all were answered. The patient agreed with the plan and demonstrated an understanding of the instructions.  Iona Beard , MD [phone]

## 2023-02-22 DIAGNOSIS — R7989 Other specified abnormal findings of blood chemistry: Secondary | ICD-10-CM | POA: Diagnosis not present

## 2023-02-22 DIAGNOSIS — R6521 Severe sepsis with septic shock: Secondary | ICD-10-CM | POA: Diagnosis not present

## 2023-02-22 DIAGNOSIS — N179 Acute kidney failure, unspecified: Secondary | ICD-10-CM | POA: Diagnosis not present

## 2023-02-22 DIAGNOSIS — J9600 Acute respiratory failure, unspecified whether with hypoxia or hypercapnia: Secondary | ICD-10-CM | POA: Diagnosis not present

## 2023-02-22 DIAGNOSIS — A419 Sepsis, unspecified organism: Secondary | ICD-10-CM | POA: Diagnosis not present

## 2023-02-22 DIAGNOSIS — I4891 Unspecified atrial fibrillation: Secondary | ICD-10-CM | POA: Diagnosis not present

## 2023-02-22 LAB — CBC
HCT: 45.6 % (ref 39.0–52.0)
Hemoglobin: 14.7 g/dL (ref 13.0–17.0)
MCH: 33 pg (ref 26.0–34.0)
MCHC: 32.2 g/dL (ref 30.0–36.0)
MCV: 102.5 fL — ABNORMAL HIGH (ref 80.0–100.0)
Platelets: 142 10*3/uL — ABNORMAL LOW (ref 150–400)
RBC: 4.45 MIL/uL (ref 4.22–5.81)
RDW: 14.9 % (ref 11.5–15.5)
WBC: 19.1 10*3/uL — ABNORMAL HIGH (ref 4.0–10.5)
nRBC: 0 % (ref 0.0–0.2)

## 2023-02-22 LAB — COMPREHENSIVE METABOLIC PANEL
ALT: 21 U/L (ref 0–44)
AST: 27 U/L (ref 15–41)
Albumin: 2.4 g/dL — ABNORMAL LOW (ref 3.5–5.0)
Alkaline Phosphatase: 90 U/L (ref 38–126)
Anion gap: 14 (ref 5–15)
BUN: 39 mg/dL — ABNORMAL HIGH (ref 8–23)
CO2: 29 mmol/L (ref 22–32)
Calcium: 8.6 mg/dL — ABNORMAL LOW (ref 8.9–10.3)
Chloride: 90 mmol/L — ABNORMAL LOW (ref 98–111)
Creatinine, Ser: 2.21 mg/dL — ABNORMAL HIGH (ref 0.61–1.24)
GFR, Estimated: 30 mL/min — ABNORMAL LOW (ref >60)
Glucose, Bld: 87 mg/dL (ref 70–99)
Potassium: 4 mmol/L (ref 3.5–5.1)
Sodium: 133 mmol/L — ABNORMAL LOW (ref 135–145)
Total Bilirubin: 1.1 mg/dL (ref 0.3–1.2)
Total Protein: 6.2 g/dL — ABNORMAL LOW (ref 6.5–8.1)

## 2023-02-22 LAB — TSH: TSH: 36.521 u[IU]/mL — ABNORMAL HIGH (ref 0.350–4.500)

## 2023-02-22 LAB — APTT
aPTT: 113 seconds — ABNORMAL HIGH (ref 24–36)
aPTT: 93 seconds — ABNORMAL HIGH (ref 24–36)
aPTT: 97 seconds — ABNORMAL HIGH (ref 24–36)

## 2023-02-22 LAB — POCT I-STAT 7, (LYTES, BLD GAS, ICA,H+H)
Acid-Base Excess: 6 mmol/L — ABNORMAL HIGH (ref 0.0–2.0)
Bicarbonate: 33.4 mmol/L — ABNORMAL HIGH (ref 20.0–28.0)
Calcium, Ion: 1.15 mmol/L (ref 1.15–1.40)
HCT: 43 % (ref 39.0–52.0)
Hemoglobin: 14.6 g/dL (ref 13.0–17.0)
O2 Saturation: 98 %
Patient temperature: 98.7
Potassium: 3.8 mmol/L (ref 3.5–5.1)
Sodium: 132 mmol/L — ABNORMAL LOW (ref 135–145)
TCO2: 35 mmol/L — ABNORMAL HIGH (ref 22–32)
pCO2 arterial: 58 mmHg — ABNORMAL HIGH (ref 32–48)
pH, Arterial: 7.368 (ref 7.35–7.45)
pO2, Arterial: 108 mmHg (ref 83–108)

## 2023-02-22 LAB — MAGNESIUM: Magnesium: 2.4 mg/dL (ref 1.7–2.4)

## 2023-02-22 LAB — TROPONIN I (HIGH SENSITIVITY): Troponin I (High Sensitivity): 29 ng/L — ABNORMAL HIGH (ref <18)

## 2023-02-22 LAB — GLUCOSE, CAPILLARY
Glucose-Capillary: 96 mg/dL (ref 70–99)
Glucose-Capillary: 79 mg/dL (ref 70–99)

## 2023-02-22 LAB — LIPASE, BLOOD: Lipase: 24 U/L (ref 11–51)

## 2023-02-22 LAB — LACTIC ACID, PLASMA: Lactic Acid, Venous: 0.8 mmol/L (ref 0.5–1.9)

## 2023-02-22 LAB — PHOSPHORUS: Phosphorus: 4.2 mg/dL (ref 2.5–4.6)

## 2023-02-22 LAB — AMYLASE: Amylase: 27 U/L — ABNORMAL LOW (ref 28–100)

## 2023-02-22 MED ORDER — MORPHINE SULFATE (PF) 2 MG/ML IV SOLN
INTRAVENOUS | Status: AC
  Filled 2023-02-22: qty 1

## 2023-02-22 MED ORDER — MAGNESIUM SULFATE 2 GM/50ML IV SOLN
2.0000 g | Freq: Once | INTRAVENOUS | Status: AC
  Administered 2023-02-22: 2 g via INTRAVENOUS
  Filled 2023-02-22: qty 50

## 2023-02-22 MED ORDER — LACTATED RINGERS IV BOLUS
500.0000 mL | Freq: Once | INTRAVENOUS | Status: AC
  Administered 2023-02-22: 500 mL via INTRAVENOUS

## 2023-02-22 MED ORDER — POTASSIUM CHLORIDE 10 MEQ/50ML IV SOLN
10.0000 meq | INTRAVENOUS | Status: AC
  Administered 2023-02-22 (×5): 10 meq via INTRAVENOUS
  Filled 2023-02-22: qty 50

## 2023-02-22 MED ORDER — MIDODRINE HCL 5 MG PO TABS
ORAL_TABLET | ORAL | Status: AC
  Filled 2023-02-22: qty 1

## 2023-02-22 NOTE — Progress Notes (Signed)
ANTICOAGULATION CONSULT NOTE - Follow Up Consult  Pharmacy Consult for Eliquis > Heparin  Indication: atrial fibrillation  Allergies  Allergen Reactions   Diltiazem     rash   Amiodarone Rash    Amiodarone DC'd 02/07/2023 due to concern causing hypothyroidism.     Patient Measurements: Height: 5\' 8"  (172.7 cm) Weight: 133.4 kg (294 lb 1.5 oz) IBW/kg (Calculated) : 68.4 Heparin Dosing Weight: 98.5kg   Vital Signs: Temp: 97.1 F (36.2 C) (07/21 0332) Temp Source: Axillary (07/21 0332) Pulse Rate: 70 (07/21 0600)  Labs: Recent Labs    02/20/23 1500 02/20/23 1529 02/20/23 1927 02/21/23 0341 02/21/23 0845 02/21/23 1045 02/21/23 2109 02/22/23 0340  HGB 19.2* 22.1* 18.7* 16.8  --   --   --   --   HCT 59.3* 65.0* 55.0* 51.2  --   --   --   --   PLT 230  --   --  209  --   --   --   --   APTT  --   --   --   --   --   --  98* 113*  HEPARINUNFRC  --   --   --   --   --   --  >1.10*  --   CREATININE 2.02* 1.90*  --  2.05*  --   --  2.03*  --   TROPONINIHS 31*  --   --   --  52* 54* 44*  --     Estimated Creatinine Clearance: 40.7 mL/min (A) (by C-G formula based on SCr of 2.03 mg/dL (H)).  Assessment: Patient presenting with septic shock from ESBL E.Coli bacteremia from cholecystitis. On Eliquis PTA, was ordered however not given as patient was on BiPAP. Unknown last dose but at least > 24 hours. CBC stable, HgB 16.8 and PLTS 209.   Potential for surgery, pharmacy consulted to dose heparin.   2100 Heparin level >1.1, supratherapeutic on heparin infusion at 1400 units/h. APTT 98, therapeutic. Heparin level likely does not correlate due PTA Eliquis. Will continue to dose based on APTT until they correlate.   No issues with the infusion or signs or symptoms of bleeding per RN.  7/21 AM: aPTT 113 from 98 on 1400 units/hr. Per RN the level was drawn appropriately and no signs/symptoms of bleeding. Will decrease rate to keep within goal  Goal of Therapy:  Heparin level 0.3-0.7  units/ml aPTT 66-102 Monitor platelets by anticoagulation protocol: Yes   Plan:  Decrease heparin infusion to 1300 units/hr Check aPTT in 8 hours Check anti-Xa and aPTT level daily until correlation Continue to monitor H&H, platelets, and for signs or symptoms of bleeding  Arabella Merles, PharmD. Clinical Pharmacist 02/22/2023 6:42 AM

## 2023-02-22 NOTE — Progress Notes (Signed)
Daughter at bedside and this RN gave pt's ring to take home.

## 2023-02-22 NOTE — Progress Notes (Signed)
ANTICOAGULATION CONSULT NOTE - Follow Up Consult  Pharmacy Consult for Eliquis > Heparin  Indication: atrial fibrillation  Allergies  Allergen Reactions   Diltiazem     rash   Amiodarone Rash    Amiodarone DC'd 02/07/2023 due to concern causing hypothyroidism.     Patient Measurements: Height: 5\' 8"  (172.7 cm) Weight: 133.4 kg (294 lb 1.5 oz) IBW/kg (Calculated) : 68.4 Heparin Dosing Weight: 98.5kg   Vital Signs: Temp: 97.7 F (36.5 C) (07/21 0700) Temp Source: Axillary (07/21 0700) BP: 79/50 (07/21 0900) Pulse Rate: 75 (07/21 1115)  Labs: Recent Labs    02/20/23 1500 02/20/23 1529 02/21/23 0341 02/21/23 0845 02/21/23 1045 02/21/23 2109 02/22/23 0340 02/22/23 0618 02/22/23 1351 02/22/23 1415  HGB 19.2*   < > 16.8  --   --   --   --  14.7 14.6  --   HCT 59.3*   < > 51.2  --   --   --   --  45.6 43.0  --   PLT 230  --  209  --   --   --   --  142*  --   --   APTT  --   --   --   --   --  98* 113*  --   --  93*  HEPARINUNFRC  --   --   --   --   --  >1.10*  --   --   --   --   CREATININE 2.02*   < > 2.05*  --   --  2.03*  --  2.21*  --   --   TROPONINIHS 31*  --   --    < > 54* 44*  --  29*  --   --    < > = values in this interval not displayed.    Estimated Creatinine Clearance: 37.4 mL/min (A) (by C-G formula based on SCr of 2.21 mg/dL (H)).  Assessment: Patient presenting with septic shock from ESBL E.Coli bacteremia from cholecystitis. On Eliquis PTA, was ordered however not given as patient was on BiPAP. Unknown last dose but at least > 24 hours. CBC stable, HgB 16.8 and PLTS 209.   APTT 93, therapeutic. No issues with the infusion or signs or symptoms of bleeding per RN.  Goal of Therapy:  Heparin level 0.3-0.7 units/ml aPTT 66-102 Monitor platelets by anticoagulation protocol: Yes   Plan:  Continue heparin infusion at 1300 units/hr Check confirmatory aPTT at 8p Check anti-Xa and aPTT level daily until correlation Continue to monitor H&H, platelets,  and for signs or symptoms of bleeding  Loralee Pacas, PharmD, BCPS 02/22/2023 3:11 PM   Please check AMION for all [facility] Pharmacy phone numbers After 10:00 PM, call Main Pharmacy [phone]

## 2023-02-22 NOTE — Progress Notes (Signed)
Eriberto RN aware of order to remove CVC.

## 2023-02-22 NOTE — Progress Notes (Signed)
Contacted pharmacy regarding PRN morphine not showing at all in Pyxis even though patient has order for. Per pharmacy pyxis still not fully back and order to have another nurse observe me pull and waste 1mg  morphine before administering to patient. Wasted 1mg  of morphine with RN Payton Emerald, RN

## 2023-02-22 NOTE — Progress Notes (Signed)
eLink Physician-Brief Progress Note Patient Name: Shane Diaz DOB: 1945/08/10 MRN: [id number]   Date of Service  02/22/2023  HPI/Events of Note  77 year old male who initially presented with septic shock secondary to peritonitis complicated by respiratory failure in the setting of likely cholecystitis.  Pending HIDA scan in the morning and is currently NPO.  CBGs 96.  eICU Interventions  Change CBG checks to every 4 hours  Hold maintenance fluids for the time being.     Intervention Category Minor Interventions: Routine modifications to care plan (e.g. PRN medications for pain, fever)  Kimbly Eanes 02/22/2023, 8:18 PM

## 2023-02-22 NOTE — Progress Notes (Signed)
eLink Physician-Brief Progress Note Patient Name: Shane Diaz DOB: Feb 25, 1946 MRN: [id number]   Date of Service  02/22/2023  HPI/Events of Note  K 3.3 and mg 1.9  eICU Interventions  Replacement ordered     Intervention Category Intermediate Interventions: Electrolyte abnormality - evaluation and management  Henry Russel, P 02/22/2023, 12:44 AM

## 2023-02-22 NOTE — Progress Notes (Signed)
ANTICOAGULATION CONSULT NOTE - Follow Up Consult  Pharmacy Consult for Eliquis > Heparin  Indication: atrial fibrillation  Allergies  Allergen Reactions   Diltiazem     rash   Amiodarone Rash    Amiodarone DC'd 02/07/2023 due to concern causing hypothyroidism.     Patient Measurements: Height: 5\' 8"  (172.7 cm) Weight: 133.4 kg (294 lb 1.5 oz) IBW/kg (Calculated) : 68.4 Heparin Dosing Weight: 98.5kg   Vital Signs: Temp: 98.2 F (36.8 C) (07/21 1928) Temp Source: Oral (07/21 1928) BP: 115/59 (07/21 2000) Pulse Rate: 72 (07/21 2000)  Labs: Recent Labs    02/20/23 1500 02/20/23 1529 02/21/23 0341 02/21/23 0845 02/21/23 1045 02/21/23 2109 02/21/23 2109 02/22/23 0340 02/22/23 0618 02/22/23 1351 02/22/23 1415 02/22/23 1945  HGB 19.2*   < > 16.8  --   --   --   --   --  14.7 14.6  --   --   HCT 59.3*   < > 51.2  --   --   --   --   --  45.6 43.0  --   --   PLT 230  --  209  --   --   --   --   --  142*  --   --   --   APTT  --   --   --   --   --  98*   < > 113*  --   --  93* 97*  HEPARINUNFRC  --   --   --   --   --  >1.10*  --   --   --   --   --   --   CREATININE 2.02*   < > 2.05*  --   --  2.03*  --   --  2.21*  --   --   --   TROPONINIHS 31*  --   --    < > 54* 44*  --   --  29*  --   --   --    < > = values in this interval not displayed.    Estimated Creatinine Clearance: 37.4 mL/min (A) (by C-G formula based on SCr of 2.21 mg/dL (H)).  Assessment: Patient presenting with septic shock from ESBL E.Coli bacteremia from cholecystitis. On Eliquis PTA, was ordered however not given as patient was on BiPAP. Unknown last dose but at least > 24 hours. CBC stable, HgB 16.8 and PLTS 209.   aPTT is therapeutic at 97, on 1300 units/hr. No s/sx of bleeding or infusion issues.   Goal of Therapy:  Heparin level 0.3-0.7 units/ml aPTT 66-102 Monitor platelets by anticoagulation protocol: Yes   Plan:  Continue heparin infusion at 1300 units/hr Check anti-Xa and aPTT level  daily until correlation Continue to monitor H&H, platelets, and for signs or symptoms of bleeding  Thank you for allowing pharmacy to participate in this patient's care,  Sherron Monday, PharmD, BCCCP Clinical Pharmacist  Phone: [phone] 02/22/2023 8:29 PM  Please check AMION for all [facility] Pharmacy phone numbers After 10:00 PM, call Main Pharmacy [phone]

## 2023-02-22 NOTE — Progress Notes (Signed)
Subjective/Chief Complaint: Pt with no acute changes Pressors off HIDA pending   Objective: Vital signs in last 24 hours: Temp:  [97.1 F (36.2 C)-99.1 F (37.3 C)] 97.1 F (36.2 C) (07/21 0332) Pulse Rate:  [64-82] 70 (07/21 0600) Resp:  [0-28] 19 (07/21 0600) SpO2:  [87 %-100 %] 100 % (07/21 0600) Arterial Line BP: (97-144)/(51-70) 109/56 (07/21 0600) FiO2 (%):  [50 %] 50 % (07/21 0600) Weight:  [128.8 kg-133.4 kg] 133.4 kg (07/21 0500) Last BM Date :  (PTA)  Intake/Output from previous day: 07/20 0701 - 07/21 0700 In: 2283.2 [I.V.:1340.8; IV Piggyback:942.4] Out: 800 [Urine:800] Intake/Output this shift: No intake/output data recorded.  PE:  Constitutional: No acute distress, conversant, appears states age. Eyes: Anicteric sclerae, moist conjunctiva, no lid lag Lungs: Clear to auscultation bilaterally, normal respiratory effort CV: regular rate and rhythm, no murmurs, no peripheral edema, pedal pulses 2+ GI: Soft, no masses or hepatosplenomegaly, tender to palpation RUQ Skin: No rashes, palpation reveals normal turgor Psychiatric: appropriate judgment and insight, oriented to person, place, and time   Lab Results:  Recent Labs    02/20/23 1500 02/20/23 1529 02/20/23 1927 02/21/23 0341  WBC 15.5*  --   --  35.0*  HGB 19.2*   < > 18.7* 16.8  HCT 59.3*   < > 55.0* 51.2  PLT 230  --   --  209   < > = values in this interval not displayed.   BMET Recent Labs    02/21/23 2109 02/22/23 0618  NA 133* 133*  K 3.3* 4.0  CL 90* 90*  CO2 30 29  GLUCOSE 85 87  BUN 33* 39*  CREATININE 2.03* 2.21*  CALCIUM 8.6* 8.6*   PT/INR No results for input(s): "LABPROT", "INR" in the last 72 hours. ABG Recent Labs    02/20/23 1927  PHART 7.469*  HCO3 36.1*    Studies/Results: VAS [place] LOWER EXTREMITY VENOUS (DVT)  Result Date: 02/21/2023  Lower Venous DVT Study Patient Name:  Shane Diaz  Date of Exam:   02/21/2023 Medical Rec #: [id number]      Accession #:     [id number] Date of Birth: 07-29-1946      Patient Gender: M Patient Age:   77 years Exam Location:  [facility] Procedure:      VAS [place] LOWER EXTREMITY VENOUS (DVT) Referring Phys: MURALI RAMASWAMY --------------------------------------------------------------------------------  Indications: Edema.  Risk Factors: HX of LE DVT, chronic venous insufficiency, varicose veins with complications,. Anticoagulation: Eliquis prior to admission & heparin during this admission. Comparison Study: Previous exam on 09/25/21 was negative for DVT. Performing Technologist: Ernestene Mention RVT, RDMS  Examination Guidelines: A complete evaluation includes B-mode imaging, spectral Doppler, color Doppler, and power Doppler as needed of all accessible portions of each vessel. Bilateral testing is considered an integral part of a complete examination. Limited examinations for reoccurring indications may be performed as noted. The reflux portion of the exam is performed with the patient in reverse Trendelenburg.  +---------+---------------+---------+-----------+----------+-------------------+ RIGHT    CompressibilityPhasicitySpontaneityPropertiesThrombus Aging      +---------+---------------+---------+-----------+----------+-------------------+ CFV      Full           Yes      Yes                  Not well visualized +---------+---------------+---------+-----------+----------+-------------------+ SFJ  Not visualized      +---------+---------------+---------+-----------+----------+-------------------+ FV Prox  Full           Yes      Yes                                      +---------+---------------+---------+-----------+----------+-------------------+ FV Mid   Full           Yes      Yes                                      +---------+---------------+---------+-----------+----------+-------------------+ FV DistalFull           Yes      Yes                                       +---------+---------------+---------+-----------+----------+-------------------+ PFV      Full                                                             +---------+---------------+---------+-----------+----------+-------------------+ POP      Full           Yes      Yes                                      +---------+---------------+---------+-----------+----------+-------------------+ PTV      Full                                         Not well visualized +---------+---------------+---------+-----------+----------+-------------------+ PERO     Full                                         Not well visualized +---------+---------------+---------+-----------+----------+-------------------+ Difficulty visualizing CFV & SFJ due to line placement/bandage  Right Technical Findings: Multiphasic arterial flow noted from groin to ankle.  +---------+---------------+---------+-----------+----------+-------------------+ LEFT     CompressibilityPhasicitySpontaneityPropertiesThrombus Aging      +---------+---------------+---------+-----------+----------+-------------------+ CFV      Full           Yes      Yes                                      +---------+---------------+---------+-----------+----------+-------------------+ SFJ      Full                                                             +---------+---------------+---------+-----------+----------+-------------------+ FV Prox  Full           Yes  Yes                                      +---------+---------------+---------+-----------+----------+-------------------+ FV Mid   Full           Yes      Yes                                      +---------+---------------+---------+-----------+----------+-------------------+ FV DistalFull           Yes      Yes                                       +---------+---------------+---------+-----------+----------+-------------------+ PFV      Full                                                             +---------+---------------+---------+-----------+----------+-------------------+ POP      Partial        Yes      Yes                                      +---------+---------------+---------+-----------+----------+-------------------+ PTV      Full                                                             +---------+---------------+---------+-----------+----------+-------------------+ PERO     Full                                         Not well visualized +---------+---------------+---------+-----------+----------+-------------------+   Left Technical Findings: Multiphasic arterial flow noted from groin to ankle.   Summary: BILATERAL: -No evidence of popliteal cyst, bilaterally. RIGHT: - There is no evidence of deep vein thrombosis in the lower extremity. However, portions of this examination were limited- see technologist comments above.  LEFT: - There is no evidence of deep vein thrombosis in the lower extremity.  - Ultrasound characteristics of enlarged lymph nodes noted in the groin.  *See table(s) above for measurements and observations. Electronically signed by Sherald Hess MD on 02/21/2023 at 4:16:32 PM.    Final    US Abdomen Limited RUQ (LIVER/GB)  Result Date: 02/20/2023 CLINICAL DATA:  Cholecystitis. EXAM: ULTRASOUND ABDOMEN LIMITED RIGHT UPPER QUADRANT COMPARISON:  02/20/2023. FINDINGS: Gallbladder: Stones and sludge are present within the gallbladder. No gallbladder wall thickening by measurement. There is questionable mild pericholecystic edema. No sonographic Murphy sign noted by sonographer. Common bile duct: Diameter: 4.7 mm Liver: No focal lesion identified. The liver has a slightly nodular contour inferiorly suggesting underlying cirrhosis increased parenchymal echogenicity. Portal vein is patent on color  Doppler imaging with normal direction of blood flow towards the liver. Other: None.  IMPRESSION: 1. Stones and sludge in the gallbladder with no gallbladder wall thickening. There is questionable pericholecystic edema. The sonographer reports no sonographic Murphy sign. Correlate clinically to exclude acute cholecystitis. 2. Hepatic steatosis. Electronically Signed   By: Thornell Sartorius M.D.   On: 02/20/2023 23:42   CT CHEST ABDOMEN PELVIS WO CONTRAST  Result Date: 02/20/2023 CLINICAL DATA:  Shortness of breath. Syncope. Chest pain. Sepsis suspected. EXAM: CT CHEST, ABDOMEN AND PELVIS WITHOUT CONTRAST TECHNIQUE: Multidetector CT imaging of the chest, abdomen and pelvis was performed following the standard protocol without IV contrast. RADIATION DOSE REDUCTION: This exam was performed according to the departmental dose-optimization program which includes automated exposure control, adjustment of the mA and/or kV according to patient size and/or use of iterative reconstruction technique. COMPARISON:  CT angio chest from 03/18/2021 FINDINGS: CT CHEST FINDINGS Cardiovascular: Heart size and mediastinal contours are unremarkable. Aortic atherosclerosis and coronary artery calcifications. No pericardial effusion. Mediastinum/Nodes: Thyroid gland, trachea, and esophagus appear normal. No enlarged the mediastinal or axillary lymph nodes. Lungs/Pleura: Lungs are hypoinflated. No pleural effusion. Bilateral subpleural consolidation is identified within both lung bases. Subsegmental atelectasis noted within the lingula. No signs of interstitial edema. No pneumothorax. Musculoskeletal: Spondylosis within the thoracic spine. No acute or suspicious osseous findings. Similar to the previous exam there is bilateral chest wall areas of increased soft tissue. On the right this measures 6.6 x 2.9 by 5.4 cm, image 36/3 and image 34/6. This is not appreciably changed from 07/14/2020. CT ABDOMEN PELVIS FINDINGS Hepatobiliary: No focal  liver abnormality identified. Gallbladder appears distended and is filled with multiple tiny stones clustered in the fundus. There is pericholecystic inflammatory fat stranding identified. Gallbladder wall thickness is suboptimally evaluated. No signs of bile duct dilatation. Pancreas: Unremarkable. No pancreatic ductal dilatation or surrounding inflammatory changes. Spleen: Normal in size without focal abnormality. Adrenals/Urinary Tract: Normal adrenal glands. Simple appearing cyst arises off the upper pole of the left kidney measuring 2.2 cm, image 64/3. No follow-up imaging recommended. No nephrolithiasis or signs of obstructive uropathy. Urinary bladder appears normal. Stomach/Bowel: Stomach is nondistended. The appendix is surgically absent. There is a short segment of mild wall thickening adjacent to the gallbladder which may reflect secondary inflammation of the colon, image 80/3. No pathologic dilatation of the large or small bowel loops. Vascular/Lymphatic: Aortic atherosclerosis. No abdominal adenopathy. Borderline left common iliac lymph node measures 1 cm, image 98/3. Right common iliac node measures 0.9 cm. Reproductive: TURP defect identified within the prostate. Other: Fat containing bilateral inguinal hernias. No ascites or focal fluid collections. No signs of pneumoperitoneum. Musculoskeletal: No acute or significant osseous findings. IMPRESSION: 1. Gallbladder appears distended and is filled with multiple tiny stones clustered in the fundus. There is pericholecystic inflammatory fat stranding identified. Gallbladder wall thickness is suboptimally evaluated. Findings are concerning for acute cholecystitis. Consider further evaluation with right upper quadrant ultrasound. 2. Bilateral subpleural consolidation within both lung bases. This may represent atelectasis or pneumonia. 3. Short segment of mild wall thickening adjacent to the gallbladder may reflect secondary inflammation of the colon. 4.  Coronary artery calcifications. 5. Fat containing bilateral inguinal hernias. 6.  Aortic Atherosclerosis (ICD10-I70.0). Electronically Signed   By: Signa Kell M.D.   On: 02/20/2023 18:40   [place] EKG SITE RITE  Result Date: 02/20/2023 If Site Rite image not attached, placement could not be confirmed due to current cardiac rhythm.  DG Chest Port 1 View  Result Date: 02/20/2023 CLINICAL DATA:  Shortness of breath. EXAM: PORTABLE CHEST 1  VIEW COMPARISON:  02/06/2023. FINDINGS: Small bilateral pleural effusions with adjacent left basilar atelectasis. Low lung volumes accentuate the cardiomediastinal silhouette. No pneumothorax. IMPRESSION: Small bilateral pleural effusions with adjacent left basilar atelectasis. Electronically Signed   By: Orvan Falconer M.D.   On: 02/20/2023 15:13    Anti-infectives: Anti-infectives (From admission, onward)    Start     Dose/Rate Route Frequency Ordered Stop   02/21/23 1700  vancomycin (VANCOCIN) 2,250 mg in sodium chloride 0.9 % 500 mL IVPB  Status:  Discontinued        2,250 mg 261.3 mL/hr over 120 Minutes Intravenous Every 24 hours 02/20/23 2048 02/21/23 1122   02/21/23 1000  meropenem (MERREM) 1 g in sodium chloride 0.9 % 100 mL IVPB        1 g 200 mL/hr over 30 Minutes Intravenous Every 12 hours 02/21/23 0407     02/21/23 0100  piperacillin-tazobactam (ZOSYN) IVPB 3.375 g  Status:  Discontinued        3.375 g 12.5 mL/hr over 240 Minutes Intravenous Every 8 hours 02/20/23 2048 02/21/23 0407   02/20/23 1800  vancomycin (VANCOCIN) IVPB 1000 mg/200 mL premix       Placed in "Followed by" Linked Group   1,000 mg 200 mL/hr over 60 Minutes Intravenous  Once 02/20/23 1623 02/21/23 0028   02/20/23 1630  vancomycin (VANCOREADY) IVPB 1500 mg/300 mL       Placed in "Followed by" Linked Group   1,500 mg 150 mL/hr over 120 Minutes Intravenous  Once 02/20/23 1623 02/20/23 2300   02/20/23 1630  ceFEPIme (MAXIPIME) 2 g in sodium chloride 0.9 % 100 mL IVPB        2  g 200 mL/hr over 30 Minutes Intravenous  Once 02/20/23 1623 02/20/23 1728   02/20/23 1530  metroNIDAZOLE (FLAGYL) IVPB 500 mg        500 mg 100 mL/hr over 60 Minutes Intravenous  Once 02/20/23 1528 02/20/23 1705       Assessment/Plan: 77 year old male with possible cholecystitis. Septic shock History of A-fib Hypertension OSA DVTs CAD Chronic heart failure   1. HIDA pending.  If positive I think he would benefit from IR cholecystostomy tube 2.  Agree with continued antibiotics. 3.   GI with no plans for ERCP. TB WNL 4.  Will follow along.  LOS: 2 days    Axel Filler 02/22/2023

## 2023-02-22 NOTE — Progress Notes (Signed)
Ring off and at bedside inside specimen cup. Pt label on cup and wife called

## 2023-02-22 NOTE — Progress Notes (Signed)
NAME:  Shane Diaz, MRN:  [id number], DOB:  Jun 13, 1946, LOS: 2 ADMISSION DATE:  02/20/2023, CONSULTATION DATE:  02/22/23 REFERRING MD:  Dalene Seltzer, CHIEF COMPLAINT:  shock   History of Present Illness:  Shane Diaz is a 77 y.o. M with PMH of HFpEF, HTN, OSA, CKD 3b, Atrial Fibrillation on Apixaban, venous insufficiency, hypothyroidism,  HL, chronic respiratory failure on home O2 who presented to the ED on 7/19 with acutely worsening shortness of breath and worsening cough.   He was recently admitted for acute CHF exacerbation and discharged home on 7/11 improved after diuresis.   He arrived to the ED on CPAP after having a syncopal event the night before per wife.  CXR was consistent with bilateral pleural effusions without clear infiltrate.   He was hypotensive, so given one L bolus with minimal improvement and started on Levophed.  Labs significant for lactic acid of 2.5, Na 132, K 3.1, creatinine near baseline of 1.9, BNP 187, WBC 15.   In this setting PCCM consulted for admission  Wife reports chest, back, abdomen pain as well as confusion last night after fall.  Confusion has resolved.  Pertinent  Medical History   has a past medical history of Acute diastolic heart failure (HCC) (40/98/1191), Arthritis, Diverticulosis (05/2015), Fatty liver (05/14/2015), History of colon polyps (05/2014), Hyperlipidemia, Hypertension, OSA (obstructive sleep apnea) (08/03/2020), Renal cyst (05/2014), Umbilical hernia, and Varicose veins.   Significant Hospital Events: Including procedures, antibiotic start and stop dates in addition to other pertinent events   7/19 Presented to the ED after syncopal event with increased shortness of breath and hypotension requiring pressors.   7/21 Some issues with pain overnight but well controlled this am   Interim History / Subjective:  Denis any acute complaints this am  Asking with HIDA will be done   Objective   Blood pressure (!) 79/50, pulse 75, temperature  97.7 F (36.5 C), temperature source Axillary, resp. rate 18, height 5\' 8"  (1.727 m), weight 133.4 kg, SpO2 98%.  Tmax yesterday at 3:06 PM 101.7.  Presents afebrile.  Blood pressure MAP 77-92 on 6 of Levophed.  Satting at 94-97% on BiPAP FiO2 50%.  Pulse 43-70s    Vent Mode: PCV;BIPAP FiO2 (%):  [50 %] 50 % Set Rate:  [16 bmp] 16 bmp PEEP:  [5 cmH20] 5 cmH20 Pressure Support:  [6 cmH20] 6 cmH20   Intake/Output Summary (Last 24 hours) at 02/22/2023 1202 Last data filed at 02/22/2023 1000 Gross per 24 hour  Intake 1732.3 ml  Output 300 ml  Net 1432.3 ml   Filed Weights   02/21/23 0354 02/21/23 1300 02/22/23 0500  Weight: 128.8 kg 128.8 kg 133.4 kg   Physical Exam  General: Acute on chronically ill appearing elderly male lying in bed, in NAD HEENT: [place]/AT, MM pink/moist, PERRL,  Neuro: Alert and oriented x3, non-focal  CV: s1s2 regular rate and rhythm, no murmur, rubs, or gallops,  PULM:  Clear to auscultation, no increased work of breathing, no added breath sounds  GI: soft, bowel sounds active in all 4 quadrants, non-tender, non-distended Extremities: warm/dry, no edema  Skin: no rashes or lesions   Resolved Hospital Problem list   Septic shock   Assessment & Plan:  E. coli bacteremia due to suspected acute cholecystitis  Elevated LFTs Elevated bilirubin -Severe RUQ pain overnight improved with opoids  -RUQ sonogram showed stones and sludge in the gallbladder without CBD dilatation.  -CT Chest/abdominal/pelvic without contrast showed a distended gallbladder filled with multiple tiny  stones clustered in the fundus with pericholecystic inflammatory fat stranding concerning for acute cholecystitis.  A short segment of mild wall thickening adjacent to the gallbladder was noted, possibly reflecting inflammation of the colon.  P: GI and Surgery following  NPO  HIDA scan pending  May need IR consult for percutaneous cholecystectomy tube  Pain control  Continue Meropenem   Acute  on chronic hypoxemic respiratory failure likely secondary to heart failure OSA Patient is on home oxygen 2-4 L daily per daughter at bedside.   P: Continue supplemental oxygen for sat goal greater than 92% Pulmonary hygiene Mobilize as able Continue nocturnal BiPAP  HFpEF -Echo 7/7 EF 65 to 70% with grade 2 diastolic dysfunction Chronic atrial fibrillation anticoagulated with Eliquis P: Eliquis on hold Continue heparin drip Continuous telemetry Optimize electrolytes Strict intake and output GDMT as able  Micturition syncope -ECHO WNL P: Obtain orthostatics once able  Continuous telemetry   Chronic venous stasis skin changes -Dopplers negative for DVT P: Local wound care  Hypothyroidism P: Continue Synthroid  AKI on CKD 4 -Baseline creatinine seems to be around 1.3-1.5.  P: P: Follow renal function  Monitor urine output Trend Bmet Avoid nephrotoxins Ensure adequate renal perfusion    Best Practice (right click and "Reselect all SmartList Selections" daily)   Diet/type: NPO ok for sips w/ meds DVT prophylaxis: DOAC GI prophylaxis: N/A Lines: N/A Foley:  N/A Code Status:  full code Last date of multidisciplinary goals of care discussion [updated daughter at bedside]  Critical care time: NA  Juana Montini D. Harris, NP-C [facility] Personal contact information can be found on Amion  If no contact or response made please call [phone] 02/22/2023, 1:46 PM

## 2023-02-22 NOTE — Progress Notes (Addendum)
[facility] Progress Note  CC:  Elevated LFTs, suspected acute cholecystitis with E. coli bacteremia   Subjective: He had severe RUQ pain around midnight which diminished after he received morphine.  No nausea or vomiting.  No chest pain or shortness of breath.  He is off BiPAP.  No family at the bedside.   Objective:  Vital signs in last 24 hours: Temp:  [97.1 F (36.2 C)-99.1 F (37.3 C)] 97.1 F (36.2 C) (07/21 0332) Pulse Rate:  [64-82] 70 (07/21 0600) Resp:  [0-28] 19 (07/21 0600) SpO2:  [87 %-100 %] 100 % (07/21 0600) Arterial Line BP: (97-144)/(51-70) 109/56 (07/21 0600) FiO2 (%):  [50 %] 50 % (07/21 0600) Weight:  [128.8 kg-133.4 kg] 133.4 kg (07/21 0500) Last BM Date :  (PTA) General: Alert,  much more awake this morning. Eyes: No scleral icterus. Heart: Regular rate and rhythm, no murmurs. Pulm: Breath sounds clear.  On oxygen 2 L nasal cannula. Abdomen: Obese abdomen, mild RUQ tenderness without rebound or guarding.  Positive bowel sounds to all 4 quadrants.  No bruit. Extremities: No edema. Neurologic:  Alert and  oriented x 4.  Moves all extremities.  Speech is clear.  Psych:  Alert and cooperative. Normal mood and affect.  Intake/Output from previous day: 07/20 0701 - 07/21 0700 In: 2283.2 [I.V.:1340.8; IV Piggyback:942.4] Out: 800 [Urine:800] Intake/Output this shift: No intake/output data recorded.  Lab Results: Recent Labs    02/20/23 1500 02/20/23 1529 02/20/23 1927 02/21/23 0341  WBC 15.5*  --   --  35.0*  HGB 19.2* 22.1* 18.7* 16.8  HCT 59.3* 65.0* 55.0* 51.2  PLT 230  --   --  209   BMET Recent Labs    02/20/23 1500 02/20/23 1529 02/20/23 1927 02/21/23 0341 02/21/23 2109  NA 130* 132* 133* 133* 133*  K 3.2* 3.1* 2.6* 3.5 3.3*  CL 76* 85*  --  88* 90*  CO2 32  --   --  31 30  GLUCOSE 125* 131*  --  121* 85  BUN 21 26*  --  28* 33*  CREATININE 2.02* 1.90*  --  2.05* 2.03*  CALCIUM 9.8  --   --  8.8* 8.6*    LFT Recent Labs    02/21/23 1045  PROT 6.8  ALBUMIN 2.7*  AST 39  ALT 25  ALKPHOS 86  BILITOT 1.6*  BILIDIR 0.6*  IBILI 1.0*   PT/INR No results for input(s): "LABPROT", "INR" in the last 72 hours. Hepatitis Panel Recent Labs    02/21/23 1445  HEPBSAG NON REACTIVE  HCVAB NON REACTIVE  HEPAIGM NON REACTIVE  HEPBIGM NON REACTIVE    VAS [place] LOWER EXTREMITY VENOUS (DVT)  Result Date: 02/21/2023  Lower Venous DVT Study Patient Name:  Shane Diaz  Date of Exam:   02/21/2023 Medical Rec #: [id number]      Accession #:    [id number] Date of Birth: [birth year]      Patient Gender: M Patient Age:   77 years Exam Location:  [facility] Procedure:      VAS [place] LOWER EXTREMITY VENOUS (DVT) Referring Phys: MURALI RAMASWAMY --------------------------------------------------------------------------------  Indications: Edema.  Risk Factors: HX of LE DVT, chronic venous insufficiency, varicose veins with complications,. Anticoagulation: Eliquis prior to admission & heparin during this admission. Comparison Study: Previous exam on 09/25/21 was negative for DVT. Performing Technologist: Ernestene Mention RVT, RDMS  Examination Guidelines: A complete evaluation includes B-mode imaging, spectral Doppler, color Doppler, and power Doppler as  needed of all accessible portions of each vessel. Bilateral testing is considered an integral part of a complete examination. Limited examinations for reoccurring indications may be performed as noted. The reflux portion of the exam is performed with the patient in reverse Trendelenburg.  +---------+---------------+---------+-----------+----------+-------------------+ RIGHT    CompressibilityPhasicitySpontaneityPropertiesThrombus Aging      +---------+---------------+---------+-----------+----------+-------------------+ CFV      Full           Yes      Yes                  Not well visualized  +---------+---------------+---------+-----------+----------+-------------------+ SFJ                                                   Not visualized      +---------+---------------+---------+-----------+----------+-------------------+ FV Prox  Full           Yes      Yes                                      +---------+---------------+---------+-----------+----------+-------------------+ FV Mid   Full           Yes      Yes                                      +---------+---------------+---------+-----------+----------+-------------------+ FV DistalFull           Yes      Yes                                      +---------+---------------+---------+-----------+----------+-------------------+ PFV      Full                                                             +---------+---------------+---------+-----------+----------+-------------------+ POP      Full           Yes      Yes                                      +---------+---------------+---------+-----------+----------+-------------------+ PTV      Full                                         Not well visualized +---------+---------------+---------+-----------+----------+-------------------+ PERO     Full                                         Not well visualized +---------+---------------+---------+-----------+----------+-------------------+ Difficulty visualizing CFV & SFJ due to line placement/bandage  Right Technical Findings: Multiphasic arterial flow noted from groin to ankle.  +---------+---------------+---------+-----------+----------+-------------------+ LEFT     CompressibilityPhasicitySpontaneityPropertiesThrombus Aging      +---------+---------------+---------+-----------+----------+-------------------+  CFV      Full           Yes      Yes                                      +---------+---------------+---------+-----------+----------+-------------------+ SFJ      Full                                                              +---------+---------------+---------+-----------+----------+-------------------+ FV Prox  Full           Yes      Yes                                      +---------+---------------+---------+-----------+----------+-------------------+ FV Mid   Full           Yes      Yes                                      +---------+---------------+---------+-----------+----------+-------------------+ FV DistalFull           Yes      Yes                                      +---------+---------------+---------+-----------+----------+-------------------+ PFV      Full                                                             +---------+---------------+---------+-----------+----------+-------------------+ POP      Partial        Yes      Yes                                      +---------+---------------+---------+-----------+----------+-------------------+ PTV      Full                                                             +---------+---------------+---------+-----------+----------+-------------------+ PERO     Full                                         Not well visualized +---------+---------------+---------+-----------+----------+-------------------+   Left Technical Findings: Multiphasic arterial flow noted from groin to ankle.   Summary: BILATERAL: -No evidence of popliteal cyst, bilaterally. RIGHT: - There is no evidence of deep vein thrombosis in the lower extremity. However, portions of this examination were limited- see technologist comments above.  LEFT: - There is no evidence of deep vein thrombosis in the lower extremity.  - Ultrasound characteristics of enlarged lymph nodes noted in the groin.  *See table(s) above for measurements and observations. Electronically signed by Sherald Hess MD on 02/21/2023 at 4:16:32 PM.    Final    US Abdomen Limited RUQ (LIVER/GB)  Result Date: 02/20/2023 CLINICAL  DATA:  Cholecystitis. EXAM: ULTRASOUND ABDOMEN LIMITED RIGHT UPPER QUADRANT COMPARISON:  02/20/2023. FINDINGS: Gallbladder: Stones and sludge are present within the gallbladder. No gallbladder wall thickening by measurement. There is questionable mild pericholecystic edema. No sonographic Murphy sign noted by sonographer. Common bile duct: Diameter: 4.7 mm Liver: No focal lesion identified. The liver has a slightly nodular contour inferiorly suggesting underlying cirrhosis increased parenchymal echogenicity. Portal vein is patent on color Doppler imaging with normal direction of blood flow towards the liver. Other: None. IMPRESSION: 1. Stones and sludge in the gallbladder with no gallbladder wall thickening. There is questionable pericholecystic edema. The sonographer reports no sonographic Murphy sign. Correlate clinically to exclude acute cholecystitis. 2. Hepatic steatosis. Electronically Signed   By: Thornell Sartorius M.D.   On: 02/20/2023 23:42   CT CHEST ABDOMEN PELVIS WO CONTRAST  Result Date: 02/20/2023 CLINICAL DATA:  Shortness of breath. Syncope. Chest pain. Sepsis suspected. EXAM: CT CHEST, ABDOMEN AND PELVIS WITHOUT CONTRAST TECHNIQUE: Multidetector CT imaging of the chest, abdomen and pelvis was performed following the standard protocol without IV contrast. RADIATION DOSE REDUCTION: This exam was performed according to the departmental dose-optimization program which includes automated exposure control, adjustment of the mA and/or kV according to patient size and/or use of iterative reconstruction technique. COMPARISON:  CT angio chest from 03/18/2021 FINDINGS: CT CHEST FINDINGS Cardiovascular: Heart size and mediastinal contours are unremarkable. Aortic atherosclerosis and coronary artery calcifications. No pericardial effusion. Mediastinum/Nodes: Thyroid gland, trachea, and esophagus appear normal. No enlarged the mediastinal or axillary lymph nodes. Lungs/Pleura: Lungs are hypoinflated. No pleural  effusion. Bilateral subpleural consolidation is identified within both lung bases. Subsegmental atelectasis noted within the lingula. No signs of interstitial edema. No pneumothorax. Musculoskeletal: Spondylosis within the thoracic spine. No acute or suspicious osseous findings. Similar to the previous exam there is bilateral chest wall areas of increased soft tissue. On the right this measures 6.6 x 2.9 by 5.4 cm, image 36/3 and image 34/6. This is not appreciably changed from 07/14/2020. CT ABDOMEN PELVIS FINDINGS Hepatobiliary: No focal liver abnormality identified. Gallbladder appears distended and is filled with multiple tiny stones clustered in the fundus. There is pericholecystic inflammatory fat stranding identified. Gallbladder wall thickness is suboptimally evaluated. No signs of bile duct dilatation. Pancreas: Unremarkable. No pancreatic ductal dilatation or surrounding inflammatory changes. Spleen: Normal in size without focal abnormality. Adrenals/Urinary Tract: Normal adrenal glands. Simple appearing cyst arises off the upper pole of the left kidney measuring 2.2 cm, image 64/3. No follow-up imaging recommended. No nephrolithiasis or signs of obstructive uropathy. Urinary bladder appears normal. Stomach/Bowel: Stomach is nondistended. The appendix is surgically absent. There is a short segment of mild wall thickening adjacent to the gallbladder which may reflect secondary inflammation of the colon, image 80/3. No pathologic dilatation of the large or small bowel loops. Vascular/Lymphatic: Aortic atherosclerosis. No abdominal adenopathy. Borderline left common iliac lymph node measures 1 cm, image 98/3. Right common iliac node measures 0.9 cm. Reproductive: TURP defect identified within the prostate. Other: Fat containing bilateral inguinal hernias. No ascites or focal fluid collections. No signs of pneumoperitoneum. Musculoskeletal: No acute or significant osseous findings. IMPRESSION: 1.  Gallbladder  appears distended and is filled with multiple tiny stones clustered in the fundus. There is pericholecystic inflammatory fat stranding identified. Gallbladder wall thickness is suboptimally evaluated. Findings are concerning for acute cholecystitis. Consider further evaluation with right upper quadrant ultrasound. 2. Bilateral subpleural consolidation within both lung bases. This may represent atelectasis or pneumonia. 3. Short segment of mild wall thickening adjacent to the gallbladder may reflect secondary inflammation of the colon. 4. Coronary artery calcifications. 5. Fat containing bilateral inguinal hernias. 6.  Aortic Atherosclerosis (ICD10-I70.0). Electronically Signed   By: Signa Kell M.D.   On: 02/20/2023 18:40   [place] EKG SITE RITE  Result Date: 02/20/2023 If Site Rite image not attached, placement could not be confirmed due to current cardiac rhythm.  DG Chest Port 1 View  Result Date: 02/20/2023 CLINICAL DATA:  Shortness of breath. EXAM: PORTABLE CHEST 1 VIEW COMPARISON:  02/06/2023. FINDINGS: Small bilateral pleural effusions with adjacent left basilar atelectasis. Low lung volumes accentuate the cardiomediastinal silhouette. No pneumothorax. IMPRESSION: Small bilateral pleural effusions with adjacent left basilar atelectasis. Electronically Signed   By: Orvan Falconer M.D.   On: 02/20/2023 15:13    Brief narrative:  Shane Diaz is a 77 y.o. male with a past medical history of obesity, hypertension, diastolic CHF, atrial fibrillation on Eliquis, chronic venous insufficiency with chronic bilateral lower extremity wounds, cor pulmonale, OSA, chronic respiratory failure on home oxygen, recurrent DVT, CKD stage IIIb and hypothyroidism admitted to the hospital 02/20/2023 with CP, SOB and syncope. In septic shock due to suspected acute cholecystitis.   Assessment / Plan:  Septic shock secondary to E. coli sepsis suspected due to cholecystitis. Elevated LFTs. RUQ sonogram showed stones and  sludge in the gallbladder without CBD dilatation. Chest/abdominal/pelvic CT without contrast showed a distended gallbladder filled with multiple tiny stones clustered in the fundus with pericholecystic inflammatory fat stranding concerning for acute cholecystitis.  A short segment of mild wall thickening adjacent to the gallbladder was noted, possibly reflecting inflammation of the colon. On Meropenem IV. On Midodrine.  Levophed weaned off.  T. Bili 1.6 -> 1.1. Alk phos 90. AST 27. ALT 21. Lipase 24. -Today's CBC results pending -NPO -Continue IV Meropenem -Seen by general surgery, HIDA ordered -Case reviewed by our biliary specialist Dr. Leone Payor. CT and RUQ sonogram without evidence of biliary ductal dilatation/choledocholithiasis therefore no recommendations for MRCP or ERCP at this time -If not a surgical candidate, recommend IR consult fpr percutaneous cholecystostomy tube -CBC, hepatic panel, BMP in a.m. -Await further recommendations per Dr. Lavon Paganini   Acute respiratory failure. Chest x-ray showed bilateral pleural effusions with left bibasilar atelectasis. Off BiPAP.    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 T. Bili 1.6 -> 1.1. Alk phos 90. AST 27. ALT 21.   AKI. Cr 1.90 -> 2.05 -> 2.21.  Hyponatremia. Na+ 133.    Atrial fibrillation. Off Eliquis. Started on Heparin IV 7/20.   Chronic diastolic CHF. Echo showed LVEF 65 to 70% with mildly reduced RV systolic function.       Principal Problem:   Septic shock (HCC)     LOS: 2 days   Shane Diaz  02/22/2023, 08:57AM   Attending physician's note   I have taken a history, reviewed the chart and examined the patient. I performed a substantive portion of this encounter, including complete performance of at least one of the key components, in conjunction with the APP. I agree with the APP's note, impression and recommendations.    Leukocytosis  and T. bili trended down Off pressors E. coli bacteremia  and suspected acute cholecystitis Plan for HIDA scan and cholecystostomy tube by IR per surgery  Supportive care per PCCM No indication for ERCP at this point GI will sign off, available if have any questions please call us back   The patient was provided an opportunity to ask questions and all were answered. The patient agreed with the plan and demonstrated an understanding of the instructions.   Iona Beard , MD [phone]

## 2023-02-23 ENCOUNTER — Inpatient Hospital Stay (HOSPITAL_COMMUNITY): Payer: Medicare Other

## 2023-02-23 DIAGNOSIS — I480 Paroxysmal atrial fibrillation: Secondary | ICD-10-CM

## 2023-02-23 DIAGNOSIS — R6521 Severe sepsis with septic shock: Secondary | ICD-10-CM | POA: Diagnosis not present

## 2023-02-23 DIAGNOSIS — J9601 Acute respiratory failure with hypoxia: Secondary | ICD-10-CM

## 2023-02-23 DIAGNOSIS — K810 Acute cholecystitis: Secondary | ICD-10-CM | POA: Diagnosis present

## 2023-02-23 DIAGNOSIS — I5033 Acute on chronic diastolic (congestive) heart failure: Secondary | ICD-10-CM

## 2023-02-23 DIAGNOSIS — J9602 Acute respiratory failure with hypercapnia: Secondary | ICD-10-CM

## 2023-02-23 DIAGNOSIS — R7881 Bacteremia: Secondary | ICD-10-CM | POA: Diagnosis present

## 2023-02-23 DIAGNOSIS — B9620 Unspecified Escherichia coli [E. coli] as the cause of diseases classified elsewhere: Secondary | ICD-10-CM | POA: Diagnosis present

## 2023-02-23 DIAGNOSIS — I482 Chronic atrial fibrillation, unspecified: Secondary | ICD-10-CM

## 2023-02-23 DIAGNOSIS — E038 Other specified hypothyroidism: Secondary | ICD-10-CM

## 2023-02-23 DIAGNOSIS — A419 Sepsis, unspecified organism: Secondary | ICD-10-CM | POA: Diagnosis not present

## 2023-02-23 HISTORY — PX: IR BILIARY DRAIN PLACEMENT WITH CHOLANGIOGRAM: IMG6043

## 2023-02-23 LAB — POCT I-STAT 7, (LYTES, BLD GAS, ICA,H+H)
Acid-Base Excess: 3 mmol/L — ABNORMAL HIGH (ref 0.0–2.0)
Acid-Base Excess: 6 mmol/L — ABNORMAL HIGH (ref 0.0–2.0)
Bicarbonate: 29.2 mmol/L — ABNORMAL HIGH (ref 20.0–28.0)
Bicarbonate: 34.1 mmol/L — ABNORMAL HIGH (ref 20.0–28.0)
Calcium, Ion: 1.16 mmol/L (ref 1.15–1.40)
Calcium, Ion: 1.18 mmol/L (ref 1.15–1.40)
HCT: 50 % (ref 39.0–52.0)
HCT: 45 % (ref 39.0–52.0)
Hemoglobin: 17 g/dL (ref 13.0–17.0)
Hemoglobin: 15.3 g/dL (ref 13.0–17.0)
O2 Saturation: 95 %
O2 Saturation: 94 %
Patient temperature: 98.4
Potassium: 4.1 mmol/L (ref 3.5–5.1)
Potassium: 3.2 mmol/L — ABNORMAL LOW (ref 3.5–5.1)
Sodium: 134 mmol/L — ABNORMAL LOW (ref 135–145)
Sodium: 138 mmol/L (ref 135–145)
TCO2: 31 mmol/L (ref 22–32)
TCO2: 36 mmol/L — ABNORMAL HIGH (ref 22–32)
pCO2 arterial: 50.6 mmHg — ABNORMAL HIGH (ref 32–48)
pCO2 arterial: 61.8 mmHg — ABNORMAL HIGH (ref 32–48)
pH, Arterial: 7.37 (ref 7.35–7.45)
pH, Arterial: 7.35 (ref 7.35–7.45)
pO2, Arterial: 79 mmHg — ABNORMAL LOW (ref 83–108)
pO2, Arterial: 77 mmHg — ABNORMAL LOW (ref 83–108)

## 2023-02-23 LAB — PHOSPHORUS
Phosphorus: 4 mg/dL (ref 2.5–4.6)
Phosphorus: 2.5 mg/dL (ref 2.5–4.6)

## 2023-02-23 LAB — CBC
HCT: 43.3 % (ref 39.0–52.0)
HCT: 43.9 % (ref 39.0–52.0)
Hemoglobin: 13.7 g/dL (ref 13.0–17.0)
Hemoglobin: 13.9 g/dL (ref 13.0–17.0)
MCH: 32.9 pg (ref 26.0–34.0)
MCH: 32.6 pg (ref 26.0–34.0)
MCHC: 31.6 g/dL (ref 30.0–36.0)
MCHC: 31.7 g/dL (ref 30.0–36.0)
MCV: 103.8 fL — ABNORMAL HIGH (ref 80.0–100.0)
MCV: 103.1 fL — ABNORMAL HIGH (ref 80.0–100.0)
Platelets: 126 10*3/uL — ABNORMAL LOW (ref 150–400)
Platelets: 113 10*3/uL — ABNORMAL LOW (ref 150–400)
RBC: 4.17 MIL/uL — ABNORMAL LOW (ref 4.22–5.81)
RBC: 4.26 MIL/uL (ref 4.22–5.81)
RDW: 15.1 % (ref 11.5–15.5)
RDW: 15.2 % (ref 11.5–15.5)
WBC: 12.6 10*3/uL — ABNORMAL HIGH (ref 4.0–10.5)
WBC: 15.1 10*3/uL — ABNORMAL HIGH (ref 4.0–10.5)
nRBC: 0 % (ref 0.0–0.2)
nRBC: 0 % (ref 0.0–0.2)

## 2023-02-23 LAB — BRAIN NATRIURETIC PEPTIDE: B Natriuretic Peptide: 134.6 pg/mL — ABNORMAL HIGH (ref 0.0–100.0)

## 2023-02-23 LAB — AEROBIC/ANAEROBIC CULTURE W GRAM STAIN (SURGICAL/DEEP WOUND)

## 2023-02-23 LAB — HEPARIN LEVEL (UNFRACTIONATED): Heparin Unfractionated: >1.1 IU/mL — ABNORMAL HIGH (ref 0.30–0.70)

## 2023-02-23 LAB — GLUCOSE, CAPILLARY
Glucose-Capillary: 82 mg/dL (ref 70–99)
Glucose-Capillary: 82 mg/dL (ref 70–99)
Glucose-Capillary: 95 mg/dL (ref 70–99)
Glucose-Capillary: 78 mg/dL (ref 70–99)
Glucose-Capillary: 75 mg/dL (ref 70–99)
Glucose-Capillary: 93 mg/dL (ref 70–99)

## 2023-02-23 LAB — BASIC METABOLIC PANEL
Anion gap: 13 (ref 5–15)
Anion gap: 14 (ref 5–15)
BUN: 44 mg/dL — ABNORMAL HIGH (ref 8–23)
BUN: 47 mg/dL — ABNORMAL HIGH (ref 8–23)
CO2: 30 mmol/L (ref 22–32)
CO2: 27 mmol/L (ref 22–32)
Calcium: 8.2 mg/dL — ABNORMAL LOW (ref 8.9–10.3)
Calcium: 8.5 mg/dL — ABNORMAL LOW (ref 8.9–10.3)
Chloride: 90 mmol/L — ABNORMAL LOW (ref 98–111)
Chloride: 91 mmol/L — ABNORMAL LOW (ref 98–111)
Creatinine, Ser: 2.04 mg/dL — ABNORMAL HIGH (ref 0.61–1.24)
Creatinine, Ser: 2.35 mg/dL — ABNORMAL HIGH (ref 0.61–1.24)
GFR, Estimated: 33 mL/min — ABNORMAL LOW (ref >60)
GFR, Estimated: 28 mL/min — ABNORMAL LOW (ref >60)
Glucose, Bld: 95 mg/dL (ref 70–99)
Glucose, Bld: 251 mg/dL — ABNORMAL HIGH (ref 70–99)
Potassium: 3.6 mmol/L (ref 3.5–5.1)
Potassium: 3 mmol/L — ABNORMAL LOW (ref 3.5–5.1)
Sodium: 133 mmol/L — ABNORMAL LOW (ref 135–145)
Sodium: 132 mmol/L — ABNORMAL LOW (ref 135–145)

## 2023-02-23 LAB — PROTIME-INR
INR: 1.4 — ABNORMAL HIGH (ref 0.8–1.2)
Prothrombin Time: 17.2 seconds — ABNORMAL HIGH (ref 11.4–15.2)

## 2023-02-23 LAB — CULTURE, BLOOD (ROUTINE X 2)

## 2023-02-23 LAB — MAGNESIUM
Magnesium: 2.2 mg/dL (ref 1.7–2.4)
Magnesium: 1.9 mg/dL (ref 1.7–2.4)

## 2023-02-23 LAB — TROPONIN I (HIGH SENSITIVITY): Troponin I (High Sensitivity): 40 ng/L — ABNORMAL HIGH (ref <18)

## 2023-02-23 LAB — APTT: aPTT: 82 seconds — ABNORMAL HIGH (ref 24–36)

## 2023-02-23 MED ORDER — HYDROCODONE-ACETAMINOPHEN 5-325 MG PO TABS: 1.0000 | ORAL_TABLET | ORAL | Status: DC | PRN

## 2023-02-23 MED ORDER — LIDOCAINE HCL 1 % IJ SOLN
INTRAMUSCULAR | Status: AC
  Filled 2023-02-23: qty 20

## 2023-02-23 MED ORDER — MIDODRINE HCL 5 MG PO TABS
10.0000 mg | ORAL_TABLET | Freq: Three times a day (TID) | ORAL | Status: DC
  Administered 2023-02-23 – 2023-02-26 (×7): 10 mg via ORAL
  Filled 2023-02-23 (×9): qty 2

## 2023-02-23 MED ORDER — SODIUM CHLORIDE 0.9 % IV SOLN
INTRAVENOUS | Status: AC | PRN
  Administered 2023-02-23: 2 g via INTRAVENOUS

## 2023-02-23 MED ORDER — POTASSIUM CHLORIDE 10 MEQ/100ML IV SOLN
10.0000 meq | INTRAVENOUS | Status: AC
  Administered 2023-02-23 – 2023-02-24 (×6): 10 meq via INTRAVENOUS
  Filled 2023-02-23 (×2): qty 100

## 2023-02-23 MED ORDER — HEPARIN (PORCINE) 25000 UT/250ML-% IV SOLN
1450.0000 [IU]/h | INTRAVENOUS | Status: DC
  Administered 2023-02-24: 1300 [IU]/h via INTRAVENOUS
  Administered 2023-02-24: 1450 [IU]/h via INTRAVENOUS
  Filled 2023-02-23: qty 250

## 2023-02-23 MED ORDER — TRAMADOL HCL 50 MG PO TABS: 50.0000 mg | ORAL_TABLET | Freq: Four times a day (QID) | ORAL | Status: DC | PRN

## 2023-02-23 MED ORDER — MIDAZOLAM HCL 2 MG/2ML IJ SOLN
INTRAMUSCULAR | Status: AC | PRN
  Administered 2023-02-23: 1 mg via INTRAVENOUS
  Administered 2023-02-23: .5 mg via INTRAVENOUS

## 2023-02-23 MED ORDER — SODIUM CHLORIDE 0.9 % IV SOLN
INTRAVENOUS | Status: AC
  Filled 2023-02-23: qty 2

## 2023-02-23 MED ORDER — FUROSEMIDE 10 MG/ML IJ SOLN
20.0000 mg | Freq: Once | INTRAMUSCULAR | Status: AC
  Administered 2023-02-23: 20 mg via INTRAVENOUS
  Filled 2023-02-23: qty 2

## 2023-02-23 MED ORDER — FENTANYL CITRATE (PF) 100 MCG/2ML IJ SOLN
INTRAMUSCULAR | Status: AC | PRN
  Administered 2023-02-23: 25 ug via INTRAVENOUS

## 2023-02-23 MED ORDER — SODIUM CHLORIDE 0.9 % IV BOLUS
500.0000 mL | Freq: Once | INTRAVENOUS | Status: AC
  Administered 2023-02-23: 500 mL via INTRAVENOUS

## 2023-02-23 MED ORDER — LACTATED RINGERS IV BOLUS
500.0000 mL | Freq: Once | INTRAVENOUS | Status: AC
  Administered 2023-02-23: 500 mL via INTRAVENOUS

## 2023-02-23 MED ORDER — SODIUM CHLORIDE 0.9 % IV SOLN: INTRAVENOUS | Status: DC | PRN

## 2023-02-23 MED ORDER — MORPHINE SULFATE (PF) 2 MG/ML IV SOLN
INTRAVENOUS | Status: AC
  Filled 2023-02-23: qty 1

## 2023-02-23 MED ORDER — MIDODRINE HCL 5 MG PO TABS: 10.0000 mg | ORAL_TABLET | Freq: Three times a day (TID) | ORAL | Status: DC

## 2023-02-23 MED ORDER — MIDAZOLAM HCL 2 MG/2ML IJ SOLN
INTRAMUSCULAR | Status: AC
  Filled 2023-02-23: qty 2

## 2023-02-23 MED ORDER — ATROPINE SULFATE 1 MG/10ML IJ SOSY
PREFILLED_SYRINGE | INTRAMUSCULAR | Status: AC
  Filled 2023-02-23: qty 10

## 2023-02-23 MED ORDER — SODIUM CHLORIDE 0.9% FLUSH
5.0000 mL | Freq: Three times a day (TID) | INTRAVENOUS | Status: DC
  Administered 2023-02-23 – 2023-02-28 (×13): 5 mL

## 2023-02-23 MED ORDER — NOREPINEPHRINE 4 MG/250ML-% IV SOLN
2.0000 ug/min | INTRAVENOUS | Status: DC
  Administered 2023-02-24: 8 ug/min via INTRAVENOUS
  Administered 2023-02-24: 10 ug/min via INTRAVENOUS
  Filled 2023-02-23 (×3): qty 250

## 2023-02-23 MED ORDER — ACETAMINOPHEN 325 MG PO TABS: 650.0000 mg | ORAL_TABLET | ORAL | Status: DC | PRN

## 2023-02-23 MED ORDER — FENTANYL CITRATE (PF) 100 MCG/2ML IJ SOLN
INTRAMUSCULAR | Status: AC
  Filled 2023-02-23: qty 2

## 2023-02-23 MED ORDER — VASOPRESSIN 20 UNITS/100 ML INFUSION FOR SHOCK
0.0400 [IU]/min | INTRAVENOUS | Status: DC
  Administered 2023-02-23 – 2023-02-24 (×2): 0.04 [IU]/min via INTRAVENOUS
  Filled 2023-02-23 (×2): qty 100

## 2023-02-23 MED ORDER — NOREPINEPHRINE 4 MG/250ML-% IV SOLN
INTRAVENOUS | Status: AC
  Administered 2023-02-23: 10 ug/min via INTRAVENOUS
  Filled 2023-02-23: qty 250

## 2023-02-23 MED ORDER — SODIUM CHLORIDE 0.9 % IV SOLN
250.0000 mL | INTRAVENOUS | Status: DC
  Administered 2023-02-23: 250 mL via INTRAVENOUS

## 2023-02-23 NOTE — Progress Notes (Signed)
Pt placed on BIPAP via servo to rest for the night. Pt tolerating well, RT will monitor as needed.   02/23/23 0014  Vent Select  $ Ventilator Initial/Subsequent  Subsequent  Invasive or Noninvasive Noninvasive  Adult Vent Y  Adult Ventilator Settings  Vent Type Servo i  Vent Mode PCV;BIPAP  Set Rate 16 bmp  FiO2 (%) 50 %  Pressure Control 8 cmH20  PEEP 5 cmH20  Adult Ventilator Measurements  Peak Airway Pressure 14 L/min  Mean Airway Pressure 8 cmH20  Resp Rate Spontaneous 4 br/min  Resp Rate Total 20 br/min  Exhaled Vt 479 mL  Measured Ve 10.6 L  I:E Ratio Measured 1:2.6  Auto PEEP 0 cmH20  Total PEEP 5 cmH20  SpO2 98 %

## 2023-02-23 NOTE — Consult Note (Signed)
Chief Complaint: Patient was seen in consultation today for acute cholecystitis  Referring Physician(s): Ripudeep Rai, MD  Supervising Physician: Gilmer Mor  Patient Status: [facility] - In-pt  History of Present Illness: Shane Diaz is a 77 y.o. male with PMH significant for chronic diastolic CHF, hypertension, CKD stage IIIb, atrial fibrillation, chronic respiratory failure on home oxygen, and obstructive sleep apnea being seen today in relation to acute cholecystitis with E. coli bacteremia felt to be secondary to acute cholecystitis. Patient was initially admitted on 02/20/23 from [facility] ED for worsening SOB and cough following recent hospitalization with discharge on 02/12/23 for acute CHF exacerbation. CT on 7/19 showed a distended gallbladder filled with multiple stones with concern for acute cholecystitis. There is clinical concern for acute cholecystitis and IR has been consulted for image-guided percutaneous cholecystostomy drain placement.   Past Medical History:  Diagnosis Date   Acute diastolic heart failure (HCC) 08/03/2020   Arthritis    Diverticulosis 05/2015   Mild, noted on colonosocpy   Fatty liver 05/14/2015   noted on [place] ABD   History of colon polyps 05/2014   sessile in ascending colon, pedunculated in sigmoid colon, diverticulosis   Hyperlipidemia    pt denies   Hypertension    not currently taking medications per MD   OSA (obstructive sleep apnea) 08/03/2020   Renal cyst 05/2014   Small, Left, noted on [place] ABD   Umbilical hernia    Varicose veins     Past Surgical History:  Procedure Laterality Date   APPENDECTOMY     childhood   BIOPSY  07/11/2020   Procedure: BIOPSY;  Surgeon: Meryl Dare, MD;  Location: [facility] ENDOSCOPY;  Service: Endoscopy;;   CARDIOVERSION N/A 07/13/2020   Procedure: CARDIOVERSION;  Surgeon: Wendall Stade, MD;  Location: [facility] ENDOSCOPY;  Service: Cardiovascular;  Laterality: N/A;   CARDIOVERSION N/A 08/02/2020   Procedure:  CARDIOVERSION;  Surgeon: Christell Constant, MD;  Location: [facility] ENDOSCOPY;  Service: Cardiovascular;  Laterality: N/A;   CARDIOVERSION N/A 12/12/2021   Procedure: CARDIOVERSION;  Surgeon: Pricilla Riffle, MD;  Location: [facility] ENDOSCOPY;  Service: Cardiovascular;  Laterality: N/A;   COLONOSCOPY W/ POLYPECTOMY  05/23/2014   COLONOSCOPY WITH PROPOFOL N/A 07/11/2020   Procedure: COLONOSCOPY WITH PROPOFOL;  Surgeon: Meryl Dare, MD;  Location: [facility] ENDOSCOPY;  Service: Endoscopy;  Laterality: N/A;   ESOPHAGOGASTRODUODENOSCOPY (EGD) WITH PROPOFOL N/A 07/11/2020   Procedure: ESOPHAGOGASTRODUODENOSCOPY (EGD) WITH PROPOFOL;  Surgeon: Meryl Dare, MD;  Location: [facility] ENDOSCOPY;  Service: Endoscopy;  Laterality: N/A;   TEE WITHOUT CARDIOVERSION N/A 07/13/2020   Procedure: TRANSESOPHAGEAL ECHOCARDIOGRAM (TEE);  Surgeon: Wendall Stade, MD;  Location: [facility] ENDOSCOPY;  Service: Cardiovascular;  Laterality: N/A;   UMBILICAL HERNIA REPAIR N/A 04/27/2018   Procedure: REPAIR UMBILICAL HERNIA ERAS PATHWAY;  Surgeon: Ovidio Kin, MD;  Location: [facility] ORS;  Service: General;  Laterality: N/A;   varicose veins     WRIST SURGERY      Allergies: Diltiazem and Amiodarone  Medications: Prior to Admission medications   Medication Sig Start Date End Date Taking? Authorizing Provider  apixaban (ELIQUIS) 5 MG TABS tablet Take 1 tablet (5 mg total) by mouth 2 (two) times daily. 10/23/22  Yes [name], [name], MD  bisacodyl 5 MG EC tablet Take 10 mg by mouth daily as needed for moderate constipation.   Yes [provider]  bismuth subsalicylate (PEPTO BISMOL) 262 MG/15ML suspension Take 30 mLs by mouth as needed for diarrhea or loose stools or indigestion.  Yes [provider]  bumetanide (BUMEX) 2 MG tablet TAKE 2 TABLETS BY MOUTH TWICE A DAY 01/12/23  Yes [name], [name], MD  levothyroxine (SYNTHROID) 100 MCG tablet Take 1 tablet (100 mcg total) by mouth daily at 6 (six) AM. 02/13/23 03/15/23 Yes [name],  [name], MD  midodrine (PROAMATINE) 5 MG tablet Take 1 tablet (5 mg total) by mouth 2 (two) times daily with a meal. 02/12/23 03/14/23 Yes [name], [name], MD  nitroGLYCERIN (NITROSTAT) 0.4 MG SL tablet Place 1 tablet (0.4 mg total) under the tongue every 5 (five) minutes x 3 doses as needed for chest pain. 12/04/21  Yes [name], [name], MD     Family History  Problem Relation Age of Onset   Colon cancer Neg Hx    Esophageal cancer Neg Hx    Pancreatic cancer Neg Hx    Prostate cancer Neg Hx    Rectal cancer Neg Hx    Stomach cancer Neg Hx     Social History   Socioeconomic History   Marital status: Unknown    Spouse name: Not on file   Number of children: 1   Years of education: Not on file   Highest education level: High school graduate  Occupational History   Occupation: retired  Tobacco Use   Smoking status: Never   Smokeless tobacco: Never  Vaping Use   Vaping status: Never Used  Substance and Sexual Activity   Alcohol use: Yes    Alcohol/week: 0.0 standard drinks of alcohol    Comment: occassional wine, martini   Drug use: No   Sexual activity: Not on file  Other Topics Concern   Not on file  Social History Narrative   Not on file   Social Determinants of Health   Financial Resource Strain: Low Risk  (02/09/2023)   Overall Financial Resource Strain (CARDIA)    Difficulty of Paying Living Expenses: Not very hard  Food Insecurity: No Food Insecurity (02/07/2023)   Hunger Vital Sign    Worried About Running Out of Food in the Last Year: Never true    Ran Out of Food in the Last Year: Never true  Transportation Needs: No Transportation Needs (02/07/2023)   PRAPARE - Administrator, Civil Service (Medical): No    Lack of Transportation (Non-Medical): No  Physical Activity: Not on file  Stress: Not on file  Social Connections: Not on file    Code Status: Full code  Review of Systems: A 12 point ROS discussed and pertinent positives are  indicated in the HPI above.  All other systems are negative.  Review of Systems  Constitutional:  Negative for chills and fever.  Respiratory:  Negative for chest tightness and shortness of breath.   Cardiovascular:  Negative for chest pain and leg swelling.  Gastrointestinal:  Positive for abdominal pain. Negative for diarrhea, nausea and vomiting.       Patient endorses RUQ abdominal pain  Neurological:  Negative for dizziness and headaches.  Psychiatric/Behavioral:  Negative for confusion.     Vital Signs: BP 100/61   Pulse 76   Temp 97.7 F (36.5 C) (Oral)   Resp (!) 24   Ht 5\' 8"  (1.727 m)   Wt 298 lb 4.5 oz (135.3 kg)   SpO2 96%   BMI 45.35 kg/m    Physical Exam Vitals reviewed.  Constitutional:      General: He is not in acute distress.    Appearance: He is ill-appearing.  Cardiovascular:  Rate and Rhythm: Normal rate and regular rhythm.     Pulses: Normal pulses.     Heart sounds: Normal heart sounds.  Pulmonary:     Effort: Pulmonary effort is normal.     Comments: Breath sounds diminished at bilateral lung bases Abdominal:     Tenderness: There is abdominal tenderness.     Comments: RUQ with tenderness to palpation  Musculoskeletal:     Right lower leg: Edema present.     Left lower leg: Edema present.     Comments: Venous stasis changes of lower extremity  Skin:    General: Skin is warm and dry.  Neurological:     Mental Status: He is alert and oriented to person, place, and time.  Psychiatric:        Mood and Affect: Mood normal.        Behavior: Behavior normal.        Thought Content: Thought content normal.        Judgment: Judgment normal.     Imaging: VAS [place] LOWER EXTREMITY VENOUS (DVT)  Result Date: 02/21/2023  Lower Venous DVT Study Patient Name:  MICKELL BIRDWELL  Date of Exam:   02/21/2023 Medical Rec #: [id number]      Accession #:    [id number] Date of Birth: Sep 18, 1945      Patient Gender: M Patient Age:   25 years Exam Location:  [facility] Procedure:      VAS [place] LOWER EXTREMITY VENOUS (DVT) Referring Phys: MURALI RAMASWAMY --------------------------------------------------------------------------------  Indications: Edema.  Risk Factors: HX of LE DVT, chronic venous insufficiency, varicose veins with complications,. Anticoagulation: Eliquis prior to admission & heparin during this admission. Comparison Study: Previous exam on 09/25/21 was negative for DVT. Performing Technologist: Ernestene Mention RVT, RDMS  Examination Guidelines: A complete evaluation includes B-mode imaging, spectral Doppler, color Doppler, and power Doppler as needed of all accessible portions of each vessel. Bilateral testing is considered an integral part of a complete examination. Limited examinations for reoccurring indications may be performed as noted. The reflux portion of the exam is performed with the patient in reverse Trendelenburg.  +---------+---------------+---------+-----------+----------+-------------------+ RIGHT    CompressibilityPhasicitySpontaneityPropertiesThrombus Aging      +---------+---------------+---------+-----------+----------+-------------------+ CFV      Full           Yes      Yes                  Not well visualized +---------+---------------+---------+-----------+----------+-------------------+ SFJ                                                   Not visualized      +---------+---------------+---------+-----------+----------+-------------------+ FV Prox  Full           Yes      Yes                                      +---------+---------------+---------+-----------+----------+-------------------+ FV Mid   Full           Yes      Yes                                      +---------+---------------+---------+-----------+----------+-------------------+  FV DistalFull           Yes      Yes                                       +---------+---------------+---------+-----------+----------+-------------------+ PFV      Full                                                             +---------+---------------+---------+-----------+----------+-------------------+ POP      Full           Yes      Yes                                      +---------+---------------+---------+-----------+----------+-------------------+ PTV      Full                                         Not well visualized +---------+---------------+---------+-----------+----------+-------------------+ PERO     Full                                         Not well visualized +---------+---------------+---------+-----------+----------+-------------------+ Difficulty visualizing CFV & SFJ due to line placement/bandage  Right Technical Findings: Multiphasic arterial flow noted from groin to ankle.  +---------+---------------+---------+-----------+----------+-------------------+ LEFT     CompressibilityPhasicitySpontaneityPropertiesThrombus Aging      +---------+---------------+---------+-----------+----------+-------------------+ CFV      Full           Yes      Yes                                      +---------+---------------+---------+-----------+----------+-------------------+ SFJ      Full                                                             +---------+---------------+---------+-----------+----------+-------------------+ FV Prox  Full           Yes      Yes                                      +---------+---------------+---------+-----------+----------+-------------------+ FV Mid   Full           Yes      Yes                                      +---------+---------------+---------+-----------+----------+-------------------+ FV DistalFull           Yes      Yes                                      +---------+---------------+---------+-----------+----------+-------------------+  PFV      Full                                                              +---------+---------------+---------+-----------+----------+-------------------+ POP      Partial        Yes      Yes                                      +---------+---------------+---------+-----------+----------+-------------------+ PTV      Full                                                             +---------+---------------+---------+-----------+----------+-------------------+ PERO     Full                                         Not well visualized +---------+---------------+---------+-----------+----------+-------------------+   Left Technical Findings: Multiphasic arterial flow noted from groin to ankle.   Summary: BILATERAL: -No evidence of popliteal cyst, bilaterally. RIGHT: - There is no evidence of deep vein thrombosis in the lower extremity. However, portions of this examination were limited- see technologist comments above.  LEFT: - There is no evidence of deep vein thrombosis in the lower extremity.  - Ultrasound characteristics of enlarged lymph nodes noted in the groin.  *See table(s) above for measurements and observations. Electronically signed by Sherald Hess MD on 02/21/2023 at 4:16:32 PM.    Final    US Abdomen Limited RUQ (LIVER/GB)  Result Date: 02/20/2023 CLINICAL DATA:  Cholecystitis. EXAM: ULTRASOUND ABDOMEN LIMITED RIGHT UPPER QUADRANT COMPARISON:  02/20/2023. FINDINGS: Gallbladder: Stones and sludge are present within the gallbladder. No gallbladder wall thickening by measurement. There is questionable mild pericholecystic edema. No sonographic Murphy sign noted by sonographer. Common bile duct: Diameter: 4.7 mm Liver: No focal lesion identified. The liver has a slightly nodular contour inferiorly suggesting underlying cirrhosis increased parenchymal echogenicity. Portal vein is patent on color Doppler imaging with normal direction of blood flow towards the liver. Other: None. IMPRESSION: 1.  Stones and sludge in the gallbladder with no gallbladder wall thickening. There is questionable pericholecystic edema. The sonographer reports no sonographic Murphy sign. Correlate clinically to exclude acute cholecystitis. 2. Hepatic steatosis. Electronically Signed   By: Thornell Sartorius M.D.   On: 02/20/2023 23:42   CT CHEST ABDOMEN PELVIS WO CONTRAST  Result Date: 02/20/2023 CLINICAL DATA:  Shortness of breath. Syncope. Chest pain. Sepsis suspected. EXAM: CT CHEST, ABDOMEN AND PELVIS WITHOUT CONTRAST TECHNIQUE: Multidetector CT imaging of the chest, abdomen and pelvis was performed following the standard protocol without IV contrast. RADIATION DOSE REDUCTION: This exam was performed according to the departmental dose-optimization program which includes automated exposure control, adjustment of the mA and/or kV according to patient size and/or use of iterative reconstruction technique. COMPARISON:  CT angio chest from 03/18/2021 FINDINGS: CT CHEST FINDINGS Cardiovascular: Heart size and mediastinal contours are unremarkable. Aortic atherosclerosis and coronary artery  calcifications. No pericardial effusion. Mediastinum/Nodes: Thyroid gland, trachea, and esophagus appear normal. No enlarged the mediastinal or axillary lymph nodes. Lungs/Pleura: Lungs are hypoinflated. No pleural effusion. Bilateral subpleural consolidation is identified within both lung bases. Subsegmental atelectasis noted within the lingula. No signs of interstitial edema. No pneumothorax. Musculoskeletal: Spondylosis within the thoracic spine. No acute or suspicious osseous findings. Similar to the previous exam there is bilateral chest wall areas of increased soft tissue. On the right this measures 6.6 x 2.9 by 5.4 cm, image 36/3 and image 34/6. This is not appreciably changed from 07/14/2020. CT ABDOMEN PELVIS FINDINGS Hepatobiliary: No focal liver abnormality identified. Gallbladder appears distended and is filled with multiple tiny stones  clustered in the fundus. There is pericholecystic inflammatory fat stranding identified. Gallbladder wall thickness is suboptimally evaluated. No signs of bile duct dilatation. Pancreas: Unremarkable. No pancreatic ductal dilatation or surrounding inflammatory changes. Spleen: Normal in size without focal abnormality. Adrenals/Urinary Tract: Normal adrenal glands. Simple appearing cyst arises off the upper pole of the left kidney measuring 2.2 cm, image 64/3. No follow-up imaging recommended. No nephrolithiasis or signs of obstructive uropathy. Urinary bladder appears normal. Stomach/Bowel: Stomach is nondistended. The appendix is surgically absent. There is a short segment of mild wall thickening adjacent to the gallbladder which may reflect secondary inflammation of the colon, image 80/3. No pathologic dilatation of the large or small bowel loops. Vascular/Lymphatic: Aortic atherosclerosis. No abdominal adenopathy. Borderline left common iliac lymph node measures 1 cm, image 98/3. Right common iliac node measures 0.9 cm. Reproductive: TURP defect identified within the prostate. Other: Fat containing bilateral inguinal hernias. No ascites or focal fluid collections. No signs of pneumoperitoneum. Musculoskeletal: No acute or significant osseous findings. IMPRESSION: 1. Gallbladder appears distended and is filled with multiple tiny stones clustered in the fundus. There is pericholecystic inflammatory fat stranding identified. Gallbladder wall thickness is suboptimally evaluated. Findings are concerning for acute cholecystitis. Consider further evaluation with right upper quadrant ultrasound. 2. Bilateral subpleural consolidation within both lung bases. This may represent atelectasis or pneumonia. 3. Short segment of mild wall thickening adjacent to the gallbladder may reflect secondary inflammation of the colon. 4. Coronary artery calcifications. 5. Fat containing bilateral inguinal hernias. 6.  Aortic  Atherosclerosis (ICD10-I70.0). Electronically Signed   By: Signa Kell M.D.   On: 02/20/2023 18:40   [place] EKG SITE RITE  Result Date: 02/20/2023 If Site Rite image not attached, placement could not be confirmed due to current cardiac rhythm.  DG Chest Port 1 View  Result Date: 02/20/2023 CLINICAL DATA:  Shortness of breath. EXAM: PORTABLE CHEST 1 VIEW COMPARISON:  02/06/2023. FINDINGS: Small bilateral pleural effusions with adjacent left basilar atelectasis. Low lung volumes accentuate the cardiomediastinal silhouette. No pneumothorax. IMPRESSION: Small bilateral pleural effusions with adjacent left basilar atelectasis. Electronically Signed   By: Orvan Falconer M.D.   On: 02/20/2023 15:13   ECHOCARDIOGRAM COMPLETE  Result Date: 02/08/2023    ECHOCARDIOGRAM REPORT   Patient Name:   REMO KIRSCHENMANN Date of Exam: 02/08/2023 Medical Rec #:  [id number]     Height:       68.0 in Accession #:    [id number]    Weight:       295.6 lb Date of Birth:  26-Mar-1946     BSA:          2.413 m Patient Age:    77 years      BP:           104/55 mmHg Patient Gender:  M             HR:           56 bpm. Exam Location:  Inpatient Procedure: 2D Echo, Cardiac Doppler, Color Doppler and Intracardiac            Opacification Agent Indications:    CHF-Acute Diastolic I50.31  History:        Patient has prior history of Echocardiogram examinations, most                 recent 03/19/2021. CHF, Arrythmias:Atrial Fibrillation and Atrial                 Flutter; Risk Factors:Hypertension and Sleep Apnea. CKD, stage                 3.  Sonographer:    Lucendia Herrlich Referring Phys: [id number] SUBRINA SUNDIL  Sonographer Comments: Technically difficult study due to poor echo windows, suboptimal apical window, suboptimal subcostal window and patient is obese. IMPRESSIONS  1. Left ventricular ejection fraction, by estimation, is 65 to 70%. The left ventricle has normal function. The left ventricle has no regional wall motion abnormalities.  There is mild asymmetric left ventricular hypertrophy of the basal segment. Left ventricular diastolic parameters are consistent with Grade II diastolic dysfunction (pseudonormalization).  2. Right ventricular systolic function is mildly reduced. The right ventricular size is normal. Tricuspid regurgitation signal is inadequate for assessing PA pressure.  3. Left atrial size was upper normal.  4. The mitral valve is grossly normal. Trivial mitral valve regurgitation.  5. The aortic valve is tricuspid. There is mild calcification of the aortic valve. Aortic valve regurgitation is not visualized. Aortic valve sclerosis is present, with no evidence of aortic valve stenosis.  6. The inferior vena cava is normal in size with greater than 50% respiratory variability, suggesting right atrial pressure of 3 mmHg. Comparison(s): Prior images reviewed side by side. LVEF vigorous at 65-70% with moderate diastolic dysfunction. FINDINGS  Left Ventricle: Left ventricular ejection fraction, by estimation, is 65 to 70%. The left ventricle has normal function. The left ventricle has no regional wall motion abnormalities. Definity contrast agent was given IV to delineate the left ventricular  endocardial borders. The left ventricular internal cavity size was normal in size. There is mild asymmetric left ventricular hypertrophy of the basal segment. Left ventricular diastolic parameters are consistent with Grade II diastolic dysfunction (pseudonormalization). Right Ventricle: The right ventricular size is normal. No increase in right ventricular wall thickness. Right ventricular systolic function is mildly reduced. Tricuspid regurgitation signal is inadequate for assessing PA pressure. The tricuspid regurgitant velocity is 0.81 m/s, and with an assumed right atrial pressure of 3 mmHg, the estimated right ventricular systolic pressure is 5.6 mmHg. Left Atrium: Left atrial size was upper normal. Right Atrium: Right atrial size was normal  in size. Pericardium: There is no evidence of pericardial effusion. Presence of epicardial fat layer. Mitral Valve: The mitral valve is grossly normal. Trivial mitral valve regurgitation. Tricuspid Valve: The tricuspid valve is grossly normal. Tricuspid valve regurgitation is trivial. Aortic Valve: The aortic valve is tricuspid. There is mild calcification of the aortic valve. There is mild aortic valve annular calcification. Aortic valve regurgitation is not visualized. Aortic valve sclerosis is present, with no evidence of aortic valve stenosis. Aortic valve peak gradient measures 5.2 mmHg. Pulmonic Valve: The pulmonic valve was grossly normal. Pulmonic valve regurgitation is trivial. Aorta: The aortic root is normal in size and structure. Venous:  The inferior vena cava is normal in size with greater than 50% respiratory variability, suggesting right atrial pressure of 3 mmHg. IAS/Shunts: No atrial level shunt detected by color flow Doppler.  LEFT VENTRICLE PLAX 2D LVIDd:         4.90 cm   Diastology LVIDs:         3.40 cm   LV e' medial:    5.26 cm/s LV PW:         0.80 cm   LV E/e' medial:  13.3 LV IVS:        1.20 cm   LV e' lateral:   5.59 cm/s LVOT diam:     2.00 cm   LV E/e' lateral: 12.5 LV SV:         53 LV SV Index:   22 LVOT Area:     3.14 cm  RIGHT VENTRICLE            IVC RV S prime:     7.70 cm/s  IVC diam: 1.60 cm LEFT ATRIUM           Index        RIGHT ATRIUM           Index LA diam:      3.60 cm 1.49 cm/m   RA Area:     21.80 cm LA Vol (A4C): 79.9 ml 33.11 ml/m  RA Volume:   59.60 ml  24.70 ml/m  AORTIC VALVE AV Area (Vmax): 2.33 cm AV Vmax:        114.00 cm/s AV Peak Grad:   5.2 mmHg LVOT Vmax:      84.40 cm/s LVOT Vmean:     54.300 cm/s LVOT VTI:       0.169 m  AORTA Ao Root diam: 3.60 cm Ao Asc diam:  3.50 cm MITRAL VALVE               TRICUSPID VALVE MV Area (PHT): 2.91 cm    TR Peak grad:   2.6 mmHg MV Decel Time: 261 msec    TR Vmax:        80.80 cm/s MV E velocity: 70.10 cm/s MV A  velocity: 53.90 cm/s  SHUNTS MV E/A ratio:  1.30        Systemic VTI:  0.17 m                            Systemic Diam: 2.00 cm Nona Dell MD Electronically signed by Nona Dell MD Signature Date/Time: 02/08/2023/12:56:47 PM    Final    CT Head Wo Contrast  Result Date: 02/06/2023 CLINICAL DATA:  Mental status change, unknown cause EXAM: CT HEAD WITHOUT CONTRAST TECHNIQUE: Contiguous axial images were obtained from the base of the skull through the vertex without intravenous contrast. RADIATION DOSE REDUCTION: This exam was performed according to the departmental dose-optimization program which includes automated exposure control, adjustment of the mA and/or kV according to patient size and/or use of iterative reconstruction technique. COMPARISON:  Head CT 09/24/2020 FINDINGS: Brain: No intracranial hemorrhage, mass effect, or midline shift. Age related atrophy. No hydrocephalus. The basilar cisterns are patent. Moderate periventricular and deep white matter hypodensity typical of chronic small vessel ischemic change. No evidence of territorial infarct or acute ischemia. No extra-axial or intracranial fluid collection. Vascular: Atherosclerosis of skullbase vasculature without hyperdense vessel or abnormal calcification. Skull: No fracture or focal lesion. Sinuses/Orbits: Bilateral cataract resection. Mucosal thickening throughout the left  maxillary sinus is new from prior exam. Minimal opacification of lower left mastoid air cells. Other: None. IMPRESSION: 1. No acute intracranial abnormality. 2. 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 3. Left maxillary sinus mucosal thickening, new from prior exam. Recommend correlation for symptoms of sinusitis. Electronically Signed   By: Narda Rutherford M.D.   On: 02/06/2023 16:19   DG Chest 2 View  Result Date: 02/06/2023 CLINICAL DATA:  Shortness of breath EXAM: CHEST - 2 VIEW COMPARISON:  X-ray and CT angiogram 03/18/2021 FINDINGS: Under  penetrated radiographs. Motion on lateral view as well. Enlarged cardiopericardial silhouette with vascular congestion and small effusions. No pneumothorax. No obvious consolidation but limited x-rays. Overlapping cardiac leads. IMPRESSION: Limited x-rays. Enlarged heart with vascular congestion and small effusions. Recommend follow-up Electronically Signed   By: Karen Kays M.D.   On: 02/06/2023 15:11    Labs:  CBC: Recent Labs    02/20/23 1500 02/20/23 1529 02/21/23 0341 02/22/23 0618 02/22/23 1351 02/23/23 0439  WBC 15.5*  --  35.0* 19.1*  --  12.6*  HGB 19.2*   < > 16.8 14.7 14.6 13.7  HCT 59.3*   < > 51.2 45.6 43.0 43.3  PLT 230  --  209 142*  --  126*   < > = values in this interval not displayed.    COAGS: Recent Labs    02/22/23 0340 02/22/23 1415 02/22/23 1945 02/23/23 0855  APTT 113* 93* 97* 82*    BMP: Recent Labs    02/21/23 0341 02/21/23 2109 02/22/23 0618 02/22/23 1351 02/23/23 0439  NA 133* 133* 133* 132* 133*  K 3.5 3.3* 4.0 3.8 3.6  CL 88* 90* 90*  --  90*  CO2 31 30 29   --  30  GLUCOSE 121* 85 87  --  95  BUN 28* 33* 39*  --  44*  CALCIUM 8.8* 8.6* 8.6*  --  8.2*  CREATININE 2.05* 2.03* 2.21*  --  2.04*  GFRNONAA 33* 33* 30*  --  33*    LIVER FUNCTION TESTS: Recent Labs    02/07/23 0600 02/20/23 1500 02/21/23 1045 02/22/23 0618  BILITOT 2.0* 2.6* 1.6* 1.1  AST 53* 63* 39 27  ALT 17 32 25 21  ALKPHOS 55 129* 86 90  PROT 6.6 9.6* 6.8 6.2*  ALBUMIN 3.6 4.2 2.7* 2.4*    TUMOR MARKERS: No results for input(s): "AFPTM", "CEA", "CA199", "CHROMGRNA" in the last 8760 hours.  Assessment and Plan:  Shane Diaz is a 77 yo male being seen today in relation to acute cholecystitis. Patient has E coli bacteremia thought to be secondary to acute cholecystitis. Surgery team recommended IR consult for image-guided percutaneous cholecystostomy drain placement. Case reviewed and approved by Dr Loreta Ave.   Risks and benefits discussed with the  patient including, but not limited to bleeding, infection, gallbladder perforation, bile leak, sepsis or even death.  All of the patient's questions were answered, patient is agreeable to proceed. Consent signed and in chart.   Thank you for this interesting consult.  I greatly enjoyed meeting Shane Diaz and look forward to participating in their care.  A copy of this report was sent to the requesting provider on this date.  Electronically Signed: Kennieth Francois, PA-C 02/23/2023, 11:56 AM   I spent a total of 20 Minutes in face to face in clinical consultation, greater than 50% of which was counseling/coordinating care for acute cholecystitis.

## 2023-02-23 NOTE — Consult Note (Addendum)
Cardiology Consultation   Patient ID: Shane Diaz MRN: [id number]; DOB: 08/14/1945  Admit date: 02/20/2023 Date of Consult: 02/23/2023  PCP:  Ralene Ok, MD   [facility] Providers Cardiologist:  Thurmon Fair, MD  Patient Profile:   Shane Diaz is a 77 y.o. 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 Isidoro Donning.  History of Present Illness:   Mr. [name] has history of atrial fibrillation/atrial flutter and followed by Dr. Royann Shivers.  First noted in October 2021 in the setting of decompensated heart failure and a flutter with RVR.  TEE showed LVEF of 45 to 50% with severely enlarged and depressed RV function (however seems to have normalized now) with moderate TR.  He underwent TEE guided cardioversion on December 2021 discharged on beta-blockers and anticoagulation however had return hospitalization later that month with another attempted cardioversion that was not successful. IV amiodarone was initiated and cardioversion was tried again next day however was unsuccessful.  The plan thereafter was to further load amiodarone and reattempt however patient was evaluated outpatient in January and had declined procedure.  Also has reported to have a lobster red pruritic rash on his neck and  shoulders thought to possibly be attributed to amiodarone.  However this seems to be less likely considering interruption of the medication has not worsened his rash despite continuation of amiodarone.  Amiodarone was restarted in April 2023 without any worsening of the rash which continues to still wax and wane.  Eventually patient underwent cardioversion in May 2023 and has been maintained normal sinus rhythm since then.  Additionally peripheral edema has been difficult to manage likely due to multifactorial illnesses cor pulmonale/RV failure, venous insufficiency, morbid obesity).  Patient is morbidly obese with poor dietary restrictions/lack of restriction of sodium.  Additionally, patient has required very large doses of loop diuretics to help with his lower extremity edema and has had issues with blisters, weeping, cellulitis.  Patient with recent admission on 02/06/2023 with complaints of progressive fatigue for the past 2 weeks and respiratory complaints and evaluated for acute on chronic HFpEF.  During this admission he was diuresed on IV Lasix but had some further blood pressure needs requiring midodrine.  Initially he presented with ectopy/junctional rhythm but had been maintaining sinus bradycardia throughout the admission.  Labs indicated hypothyroidism so amiodarone was put on hold.  Overall status was poor and he was deconditioned so SNF was recommended however patient refused.  Patient was admitted on 02/20/2023 with complaints of worsening shortness of breath, cough, syncopal episode.  Patient was initially admitted to critical care medicine for shock thought to be due to sepsis and hypovolemia secondary to acute cholecystitis with E. coli bacteremia.  He has been started on IV antibiotics.  He was hypotensive in the ED so he was given a bolus of fluids and started on Levophed and started on BiPAP.  Patient was seen  today by IR for percutaneous cholecystectomy drain.  Since his procedure patient  has had episodes of tachycardia reaching 120s, became hypotensive with BPs in the 70s, and cyanotic with oxygen in 60s 70s.  However blood pressure and heart rate has now normalized back to normal ranges. It appears patient has been going in and out of atrial fibrillation and NSR throughout the admission, but more erratic since procedure.  Patient has now been placed back on BiPAP, but weaning off. Pressures also have been soft since procedure. Prior to this episode patient was going to be transferred to hospital services and had been recently obtaining [place] 2 to 4 L on home regimen.  Patient evaluated at the bedside and seems lethargic.  He is difficult to communicate with due to lethargy and BiPAP.  Patient however voices no significant complaints.  Denied any chest pain, shortness of breath, palpitations, dizziness.  Patient overall asymptomatic of his A-fib.  In-depth history was not obtained due to patient's current mentation.  Additionally, patient's wife at the bedside however she speaks [place].  Google translate services were used due to our services being down.  Venous Dopplers on 7/20 negative for DVT.  Troponins minimally elevated and flat.  Creatinine 2.0.   Past Medical History:  Diagnosis Date   Acute diastolic heart failure (HCC) 08/03/2020   Arthritis    Diverticulosis 05/2015   Mild, noted on colonosocpy   Fatty liver 05/14/2015   noted on [place] ABD   History of colon polyps 05/2014   sessile in ascending colon, pedunculated in sigmoid colon, diverticulosis   Hyperlipidemia    pt denies   Hypertension    not currently taking medications per MD   OSA (obstructive sleep apnea) 08/03/2020   Renal cyst 05/2014   Small, Left, noted on [place] ABD   Umbilical hernia    Varicose veins     Past Surgical History:  Procedure Laterality Date   APPENDECTOMY     childhood   BIOPSY  07/11/2020   Procedure: BIOPSY;  Surgeon: Meryl Dare, MD;  Location: [facility] ENDOSCOPY;  Service:  Endoscopy;;   CARDIOVERSION N/A 07/13/2020   Procedure: CARDIOVERSION;  Surgeon: Wendall Stade, MD;  Location: [facility] ENDOSCOPY;  Service: Cardiovascular;  Laterality: N/A;   CARDIOVERSION N/A 08/02/2020   Procedure: CARDIOVERSION;  Surgeon: Christell Constant, MD;  Location: [facility] ENDOSCOPY;  Service: Cardiovascular;  Laterality: N/A;   CARDIOVERSION N/A 12/12/2021   Procedure: CARDIOVERSION;  Surgeon: Pricilla Riffle, MD;  Location: [facility] ENDOSCOPY;  Service: Cardiovascular;  Laterality: N/A;   COLONOSCOPY W/ POLYPECTOMY  05/23/2014   COLONOSCOPY WITH PROPOFOL N/A 07/11/2020   Procedure: COLONOSCOPY WITH PROPOFOL;  Surgeon: Meryl Dare, MD;  Location: [facility] ENDOSCOPY;  Service: Endoscopy;  Laterality: N/A;   ESOPHAGOGASTRODUODENOSCOPY (EGD) WITH PROPOFOL N/A 07/11/2020   Procedure: ESOPHAGOGASTRODUODENOSCOPY (EGD) WITH PROPOFOL;  Surgeon: Meryl Dare, MD;  Location: [facility] ENDOSCOPY;  Service: Endoscopy;  Laterality: N/A;   TEE WITHOUT CARDIOVERSION N/A 07/13/2020   Procedure: TRANSESOPHAGEAL ECHOCARDIOGRAM (TEE);  Surgeon: Wendall Stade, MD;  Location: [facility] ENDOSCOPY;  Service: Cardiovascular;  Laterality: N/A;   UMBILICAL HERNIA REPAIR N/A 04/27/2018   Procedure: REPAIR UMBILICAL HERNIA ERAS PATHWAY;  Surgeon: Ovidio Kin, MD;  Location: [facility] ORS;  Service: General;  Laterality: N/A;   varicose veins     WRIST SURGERY      Inpatient Medications: Scheduled Meds:  Chlorhexidine Gluconate Cloth  6 each Topical Q0600   levothyroxine  100 mcg Oral Q0600  midodrine  10 mg Oral Q8H   mouth rinse  15 mL Mouth Rinse 4 times per day   Continuous Infusions:  heparin     meropenem (MERREM) IV Stopped (02/23/23 1026)   PRN Meds: acetaminophen, atropine, docusate sodium, mouth rinse, polyethylene glycol, traMADol  Allergies:    Allergies  Allergen Reactions   Diltiazem     rash   Amiodarone Rash    Amiodarone DC'd 02/07/2023 due to concern causing hypothyroidism.     Social History:    Social History   Socioeconomic History   Marital status: Unknown    Spouse name: Not on file   Number of children: 1   Years of education: Not on file   Highest education level: High school graduate  Occupational History   Occupation: retired  Tobacco Use   Smoking status: Never   Smokeless tobacco: Never  Vaping Use   Vaping status: Never Used  Substance and Sexual Activity   Alcohol use: Yes    Alcohol/week: 0.0 standard drinks of alcohol    Comment: occassional wine, martini   Drug use: No   Sexual activity: Not on file  Other Topics Concern   Not on file  Social History Narrative   Not on file   Social Determinants of Health   Financial Resource Strain: Low Risk  (02/09/2023)   Overall Financial Resource Strain (CARDIA)    Difficulty of Paying Living Expenses: Not very hard  Food Insecurity: No Food Insecurity (02/07/2023)   Hunger Vital Sign    Worried About Running Out of Food in the Last Year: Never true    Ran Out of Food in the Last Year: Never true  Transportation Needs: No Transportation Needs (02/07/2023)   PRAPARE - Administrator, Civil Service (Medical): No    Lack of Transportation (Non-Medical): No  Physical Activity: Not on file  Stress: Not on file  Social Connections: Not on file  Intimate Partner Violence: Not At Risk (02/07/2023)   Humiliation, Afraid, Rape, and Kick questionnaire    Fear of Current or Ex-Partner: No    Emotionally Abused: No    Physically Abused: No    Sexually Abused: No    Family History:    Family History  Problem Relation Age of Onset   Colon cancer Neg Hx    Esophageal cancer Neg Hx    Pancreatic cancer Neg Hx    Prostate cancer Neg Hx    Rectal cancer Neg Hx    Stomach cancer Neg Hx      ROS:  Please see the history of present illness.  All other ROS reviewed and negative.     Physical Exam/Data:   Vitals:   02/23/23 1548 02/23/23 1550 02/23/23 1600 02/23/23 1601  BP:   123/80   Pulse: (!) 101 (!)  114 (!) 119 (!) 128  Resp: (!) 31 (!) 30 (!) 27 (!) 27  Temp:      TempSrc:      SpO2: (!) 74% (!) 74% 93% (!) 61%  Weight:      Height:        Intake/Output Summary (Last 24 hours) at 02/23/2023 1613 Last data filed at 02/23/2023 1500 Gross per 24 hour  Intake 965.28 ml  Output 300 ml  Net 665.28 ml      02/23/2023    4:40 AM 02/22/2023    5:00 AM 02/21/2023    1:00 PM  Last 3 Weights  Weight (lbs) 298 lb 4.5 oz  294 lb 1.5 oz 283 lb 15.2 oz  Weight (kg) 135.3 kg 133.4 kg 128.8 kg     Body mass index is 45.35 kg/m.  General: Lethargic and ill-appearing, drowsy on BiPAP. HEENT: normal Neck: Difficult to assess Vascular: No carotid bruits; Distal pulses 2+ bilaterally Cardiac: Irregularly irregular Lungs:  clear to auscultation bilaterally, no wheezing, rhonchi or rales  Abd: soft, nontender, no hepatomegaly  Ext: no edema Musculoskeletal:  No deformities, BUE and BLE strength normal and equal Skin: brawny with evidence of stasis Neuro:  CNs 2-12 intact, no focal abnormalities noted Psych:  Normal affect   EKG:  The EKG was personally reviewed and demonstrates:  Atrial fibrillation, heart rate 87.  Right bundle branch block.  Nonspecific T wave changes. QTC 570 however in irregular rhythm Telemetry:  Telemetry was personally reviewed and demonstrates:  Afib with variable rates as low as 48 and as high as 130.  Relevant CV Studies: Echocardiogram 02/08/2023  1. Left ventricular ejection fraction, by estimation, is 65 to 70%. The  left ventricle has normal function. The left ventricle has no regional  wall motion abnormalities. There is mild asymmetric left ventricular  hypertrophy of the basal segment. Left  ventricular diastolic parameters are consistent with Grade II diastolic  dysfunction (pseudonormalization).   2. Right ventricular systolic function is mildly reduced. The right  ventricular size is normal. Tricuspid regurgitation signal is inadequate  for assessing PA  pressure.   3. Left atrial size was upper normal.   4. The mitral valve is grossly normal. Trivial mitral valve  regurgitation.   5. The aortic valve is tricuspid. There is mild calcification of the  aortic valve. Aortic valve regurgitation is not visualized. Aortic valve  sclerosis is present, with no evidence of aortic valve stenosis.   6. The inferior vena cava is normal in size with greater than 50%  respiratory variability, suggesting right atrial pressure of 3 mmHg.   Comparison(s): Prior images reviewed side by side. LVEF vigorous at 65-70%  with moderate diastolic dysfunction.   Laboratory Data:  High Sensitivity Troponin:   Recent Labs  Lab 02/20/23 1500 02/21/23 0845 02/21/23 1045 02/21/23 2109 02/22/23 0618  TROPONINIHS 31* 52* 54* 44* 29*     Chemistry Recent Labs  Lab 02/21/23 2109 02/22/23 0618 02/22/23 1351 02/23/23 0439  NA 133* 133* 132* 133*  K 3.3* 4.0 3.8 3.6  CL 90* 90*  --  90*  CO2 30 29  --  30  GLUCOSE 85 87  --  95  BUN 33* 39*  --  44*  CREATININE 2.03* 2.21*  --  2.04*  CALCIUM 8.6* 8.6*  --  8.2*  MG 1.9 2.4  --  2.2  GFRNONAA 33* 30*  --  33*  ANIONGAP 13 14  --  13    Recent Labs  Lab 02/20/23 1500 02/21/23 1045 02/22/23 0618  PROT 9.6* 6.8 6.2*  ALBUMIN 4.2 2.7* 2.4*  AST 63* 39 27  ALT 32 25 21  ALKPHOS 129* 86 90  BILITOT 2.6* 1.6* 1.1   Lipids No results for input(s): "CHOL", "TRIG", "HDL", "LABVLDL", "LDLCALC", "CHOLHDL" in the last 168 hours.  Hematology Recent Labs  Lab 02/21/23 0341 02/22/23 0618 02/22/23 1351 02/23/23 0439  WBC 35.0* 19.1*  --  12.6*  RBC 5.07 4.45  --  4.17*  HGB 16.8 14.7 14.6 13.7  HCT 51.2 45.6 43.0 43.3  MCV 101.0* 102.5*  --  103.8*  MCH 33.1 33.0  --  32.9  MCHC 32.8 32.2  --  31.6  RDW 14.9 14.9  --  15.1  PLT 209 142*  --  126*   Thyroid  Recent Labs  Lab 02/22/23 0618  TSH 36.521*    BNP Recent Labs  Lab 02/20/23 1500 02/23/23 0855  BNP 187.5* 134.6*    DDimer No  results for input(s): "DDIMER" in the last 168 hours.   Radiology/Studies:  VAS [place] LOWER EXTREMITY VENOUS (DVT)  Result Date: 02/21/2023  Lower Venous DVT Study Patient Name:  CASPIAN DELEONARDIS  Date of Exam:   02/21/2023 Medical Rec #: [id number]      Accession #:    [id number] Date of Birth: 01/03/1946      Patient Gender: M Patient Age:   76 years Exam Location:  [facility] Procedure:      VAS [place] LOWER EXTREMITY VENOUS (DVT) Referring Phys: MURALI RAMASWAMY --------------------------------------------------------------------------------  Indications: Edema.  Risk Factors: HX of LE DVT, chronic venous insufficiency, varicose veins with complications,. Anticoagulation: Eliquis prior to admission & heparin during this admission. Comparison Study: Previous exam on 09/25/21 was negative for DVT. Performing Technologist: Ernestene Mention RVT, RDMS  Examination Guidelines: A complete evaluation includes B-mode imaging, spectral Doppler, color Doppler, and power Doppler as needed of all accessible portions of each vessel. Bilateral testing is considered an integral part of a complete examination. Limited examinations for reoccurring indications may be performed as noted. The reflux portion of the exam is performed with the patient in reverse Trendelenburg.  +---------+---------------+---------+-----------+----------+-------------------+ RIGHT    CompressibilityPhasicitySpontaneityPropertiesThrombus Aging      +---------+---------------+---------+-----------+----------+-------------------+ CFV      Full           Yes      Yes                  Not well visualized +---------+---------------+---------+-----------+----------+-------------------+ SFJ                                                   Not visualized      +---------+---------------+---------+-----------+----------+-------------------+ FV Prox  Full           Yes      Yes                                       +---------+---------------+---------+-----------+----------+-------------------+ FV Mid   Full           Yes      Yes                                      +---------+---------------+---------+-----------+----------+-------------------+ FV DistalFull           Yes      Yes                                      +---------+---------------+---------+-----------+----------+-------------------+ PFV      Full                                                             +---------+---------------+---------+-----------+----------+-------------------+  POP      Full           Yes      Yes                                      +---------+---------------+---------+-----------+----------+-------------------+ PTV      Full                                         Not well visualized +---------+---------------+---------+-----------+----------+-------------------+ PERO     Full                                         Not well visualized +---------+---------------+---------+-----------+----------+-------------------+ Difficulty visualizing CFV & SFJ due to line placement/bandage  Right Technical Findings: Multiphasic arterial flow noted from groin to ankle.  +---------+---------------+---------+-----------+----------+-------------------+ LEFT     CompressibilityPhasicitySpontaneityPropertiesThrombus Aging      +---------+---------------+---------+-----------+----------+-------------------+ CFV      Full           Yes      Yes                                      +---------+---------------+---------+-----------+----------+-------------------+ SFJ      Full                                                             +---------+---------------+---------+-----------+----------+-------------------+ FV Prox  Full           Yes      Yes                                      +---------+---------------+---------+-----------+----------+-------------------+ FV Mid   Full            Yes      Yes                                      +---------+---------------+---------+-----------+----------+-------------------+ FV DistalFull           Yes      Yes                                      +---------+---------------+---------+-----------+----------+-------------------+ PFV      Full                                                             +---------+---------------+---------+-----------+----------+-------------------+ POP      Partial        Yes      Yes                                      +---------+---------------+---------+-----------+----------+-------------------+  PTV      Full                                                             +---------+---------------+---------+-----------+----------+-------------------+ PERO     Full                                         Not well visualized +---------+---------------+---------+-----------+----------+-------------------+   Left Technical Findings: Multiphasic arterial flow noted from groin to ankle.   Summary: BILATERAL: -No evidence of popliteal cyst, bilaterally. RIGHT: - There is no evidence of deep vein thrombosis in the lower extremity. However, portions of this examination were limited- see technologist comments above.  LEFT: - There is no evidence of deep vein thrombosis in the lower extremity.  - Ultrasound characteristics of enlarged lymph nodes noted in the groin.  *See table(s) above for measurements and observations. Electronically signed by Sherald Hess MD on 02/21/2023 at 4:16:32 PM.    Final    US Abdomen Limited RUQ (LIVER/GB)  Result Date: 02/20/2023 CLINICAL DATA:  Cholecystitis. EXAM: ULTRASOUND ABDOMEN LIMITED RIGHT UPPER QUADRANT COMPARISON:  02/20/2023. FINDINGS: Gallbladder: Stones and sludge are present within the gallbladder. No gallbladder wall thickening by measurement. There is questionable mild pericholecystic edema. No sonographic Murphy sign noted by sonographer.  Common bile duct: Diameter: 4.7 mm Liver: No focal lesion identified. The liver has a slightly nodular contour inferiorly suggesting underlying cirrhosis increased parenchymal echogenicity. Portal vein is patent on color Doppler imaging with normal direction of blood flow towards the liver. Other: None. IMPRESSION: 1. Stones and sludge in the gallbladder with no gallbladder wall thickening. There is questionable pericholecystic edema. The sonographer reports no sonographic Murphy sign. Correlate clinically to exclude acute cholecystitis. 2. Hepatic steatosis. Electronically Signed   By: Thornell Sartorius M.D.   On: 02/20/2023 23:42   CT CHEST ABDOMEN PELVIS WO CONTRAST  Result Date: 02/20/2023 CLINICAL DATA:  Shortness of breath. Syncope. Chest pain. Sepsis suspected. EXAM: CT CHEST, ABDOMEN AND PELVIS WITHOUT CONTRAST TECHNIQUE: Multidetector CT imaging of the chest, abdomen and pelvis was performed following the standard protocol without IV contrast. RADIATION DOSE REDUCTION: This exam was performed according to the departmental dose-optimization program which includes automated exposure control, adjustment of the mA and/or kV according to patient size and/or use of iterative reconstruction technique. COMPARISON:  CT angio chest from 03/18/2021 FINDINGS: CT CHEST FINDINGS Cardiovascular: Heart size and mediastinal contours are unremarkable. Aortic atherosclerosis and coronary artery calcifications. No pericardial effusion. Mediastinum/Nodes: Thyroid gland, trachea, and esophagus appear normal. No enlarged the mediastinal or axillary lymph nodes. Lungs/Pleura: Lungs are hypoinflated. No pleural effusion. Bilateral subpleural consolidation is identified within both lung bases. Subsegmental atelectasis noted within the lingula. No signs of interstitial edema. No pneumothorax. Musculoskeletal: Spondylosis within the thoracic spine. No acute or suspicious osseous findings. Similar to the previous exam there is  bilateral chest wall areas of increased soft tissue. On the right this measures 6.6 x 2.9 by 5.4 cm, image 36/3 and image 34/6. This is not appreciably changed from 07/14/2020. CT ABDOMEN PELVIS FINDINGS Hepatobiliary: No focal liver abnormality identified. Gallbladder appears distended and is filled with multiple tiny stones clustered in the fundus. There is pericholecystic inflammatory  fat stranding identified. Gallbladder wall thickness is suboptimally evaluated. No signs of bile duct dilatation. Pancreas: Unremarkable. No pancreatic ductal dilatation or surrounding inflammatory changes. Spleen: Normal in size without focal abnormality. Adrenals/Urinary Tract: Normal adrenal glands. Simple appearing cyst arises off the upper pole of the left kidney measuring 2.2 cm, image 64/3. No follow-up imaging recommended. No nephrolithiasis or signs of obstructive uropathy. Urinary bladder appears normal. Stomach/Bowel: Stomach is nondistended. The appendix is surgically absent. There is a short segment of mild wall thickening adjacent to the gallbladder which may reflect secondary inflammation of the colon, image 80/3. No pathologic dilatation of the large or small bowel loops. Vascular/Lymphatic: Aortic atherosclerosis. No abdominal adenopathy. Borderline left common iliac lymph node measures 1 cm, image 98/3. Right common iliac node measures 0.9 cm. Reproductive: TURP defect identified within the prostate. Other: Fat containing bilateral inguinal hernias. No ascites or focal fluid collections. No signs of pneumoperitoneum. Musculoskeletal: No acute or significant osseous findings. IMPRESSION: 1. Gallbladder appears distended and is filled with multiple tiny stones clustered in the fundus. There is pericholecystic inflammatory fat stranding identified. Gallbladder wall thickness is suboptimally evaluated. Findings are concerning for acute cholecystitis. Consider further evaluation with right upper quadrant ultrasound. 2.  Bilateral subpleural consolidation within both lung bases. This may represent atelectasis or pneumonia. 3. Short segment of mild wall thickening adjacent to the gallbladder may reflect secondary inflammation of the colon. 4. Coronary artery calcifications. 5. Fat containing bilateral inguinal hernias. 6.  Aortic Atherosclerosis (ICD10-I70.0). Electronically Signed   By: Signa Kell M.D.   On: 02/20/2023 18:40   [place] EKG SITE RITE  Result Date: 02/20/2023 If Site Rite image not attached, placement could not be confirmed due to current cardiac rhythm.  DG Chest Port 1 View  Result Date: 02/20/2023 CLINICAL DATA:  Shortness of breath. EXAM: PORTABLE CHEST 1 VIEW COMPARISON:  02/06/2023. FINDINGS: Small bilateral pleural effusions with adjacent left basilar atelectasis. Low lung volumes accentuate the cardiomediastinal silhouette. No pneumothorax. IMPRESSION: Small bilateral pleural effusions with adjacent left basilar atelectasis. Electronically Signed   By: Orvan Falconer M.D.   On: 02/20/2023 15:13     Assessment and Plan:   Septic shock secondary to acute cholecystitis/E. coli bacteremia Overall seemed to be improving until post procedure today.  Weaned off vasopressor support.  Underwent IR percutaneous drainage today.  Had plans for transitioning from critical care medicine to [facility].  Given above episode now remains in the ICU.  Prior to episode patient was back to home oxygen requirements 2 to 4 L and had been doing well.  Still currently requiring BiPAP however this continues to be weaned off.  Follow-up chest x-ray showed worsening left basilar airspace disease with opacities at the left lung base.  Continue IV antibiotics and further management per CCM/TRH.  Paroxysmal atrial fibrillation Hx of A flutter  Patient with variable rates as low as in the high 40s to RVR 130 after IR percutaneous drainage today.  I suspect variable rates and episode of hypoxia requiring BiPAP and subsequent  hypotension likely due to sedation from procedure.  Prior to this episode patient has had normally controlled rates without medication.  Would defer initiation of amio due to intermittent bradycardia and above episode likely exacerbating/driving arrhythmia. However, can consider reinitiating if patients rates become labile sporadic again. He does have hypothyroidism however per prior notes this appears to be due to cessation of Synthroid and perhaps not related to amiodarone.  Additionally, TSH has improved since reinitiating from previous admission.  Continue IV heparin and transition to Eliquis once stable and no more upcoming procedures.  Chronic HFpEF Fluid status very difficult to assess, however seems to be overall euvolemic.  Echocardiogram on 7/7 showed EF of 65 to 70% with dynamic function and grade 2 diastolic dysfunction.  Titrate GDMT when more hemodynamically stable.  AKI on CKD Likely due to shock and hypoperfusion.  Creatinine 2.04. Unclear of baseline given progressive renal decline.   Hypothyroidism Continue Synthroid 100 mcg.  Prolonged QTc 570 Likely not entirely reliable given irregular rhythm and BBB.  Continue to monitor and repeat EKG when back in normal sinus rhythm.  Just in case avoid QT prolonging drugs if able.  Risk Assessment/Risk Scores:   New York Heart Association (NYHA) Functional Class NYHA Class III  CHA2DS2-VASc Score = 4  } This indicates a 4.8% annual risk of stroke. The patient's score is based upon: CHF History: 1 HTN History: 1 Diabetes History: 0 Stroke History: 0 Vascular Disease History: 0 Age Score: 2 Gender Score: 0    For questions or updates, please contact [facility] Please consult [email or web address] for contact info under    Signed, Abagail Kitchens, PA-C  02/23/2023 4:13 PM   Personally seen and examined. Agree with above.  77 year old with paroxysmal atrial fibrillation status post prior cardioversion, prior use of  amiodarone until significant hypothyroidism noted, currently on Synthroid here with cholecystitis, septic shock, E. coli bacteremia, percutaneous drain placed by interventional radiology.  Periprocedure developed arrhythmia, appear to be atrial fibrillation at first.  Currently on telemetry he is demonstrating sinus rhythm with 2 consecutive to 3 consecutive PVCs following a normally conducted beat.  He is not currently in atrial fibrillation.  He is on BiPAP, able to acknowledge me in the room.  Ill-appearing.  Morbidly obese.  Echocardiogram 02/08/2023-EF 70% with grade 2 diastolic dysfunction.  Troponin flat between 44 and 29. Creatinine 2.04, white count decreasing from 35 down to 12.6.  Paroxysmal atrial fibrillation/history of atrial flutter - Earlier heart rates were up to 130 directly after interventional radiology percutaneous drainage.  Current underlying medical conditions have triggered his arrhythmia.  Thankfully, he does appear to be in sinus rhythm with PVCs. -I think it makes sense for [place] to continue to monitor this.  I would not place him back on amiodarone at this point.  This was recently stopped in early July during hospitalization when TSH was found to be highly elevated.  He is currently on Synthroid 100 mcg. -As his illness continues to improve, we should be able to see improvement in his overall rhythm status as well.  Once again, he is not currently in atrial fibrillation. -Chronic anticoagulation with Eliquis currently on hold.  He is on IV heparin drip.  Donato Schultz, MD

## 2023-02-23 NOTE — Progress Notes (Signed)
An USGPIV (ultrasound guided PIV) has been placed for short-term vasopressor infusion. A correctly placed ivWatch must be used when administering Vasopressors. Should this treatment be needed beyond 72 hours, central line access should be obtained.  It will be the responsibility of the bedside nurse to follow best practice to prevent extravasations.   

## 2023-02-23 NOTE — Procedures (Signed)
  Procedure:  [place] and fluoro guided 36f percutaneous cholecystostomy catheter placement   Preprocedure diagnosis: The encounter diagnosis was SOB (shortness of breath). Postprocedure diagnosis: same EBL:    minimal Complications:   none immediate  See full dictation in [employer].  Thora Lance MD Main # [phone] Pager  [phone] Mobile [phone]

## 2023-02-23 NOTE — Progress Notes (Signed)
[facility]                                                                              Shane Diaz, is a 77 y.o. male, DOB - July 24, 1946, [id number] Admit date - 02/20/2023    Outpatient Primary MD for the patient is Ralene Ok, MD  LOS - 3  days  Chief Complaint  Patient presents with   Shortness of Breath       Brief summary   Patient is a 77 year old male with chronic diastolic CHF, HTN, CKD stage IIIb, A-fib on apixaban, chronic venous insufficiency, hypothyroidism, chronic respiratory failure on home O2, OSA on CPAP presented to ED on 7/19 with acutely worsening shortness of breath and cough.  He was recently admitted for acute CHF exacerbation and was discharged home on 7/11 after diuresis. He arrived to the ED on CPAP after having a syncopal event the night before per wife.  Wife also reported chest, back, abdominal pain, confusion    CXR with bilateral pleural effusions without clear infiltrate.  He was hypotensive, so given one L bolus with minimal improvement and started on Levophed. Labs significant for lactic acid of 2.5, Na 132, K 3.1, creatinine near baseline of 1.9, BNP 187, WBC 15. In this setting PCCM consulted for admission  Patient was admitted to ICU by CCM.  He was found to have septic shock, E. coli bacteremia due to suspected acute cholecystitis.  Acute on chronic respiratory failure, has not been tolerating BiPAP at night.  Surgery following.  7/22: Patient transferred to [facility]    Assessment & Plan    Principal Problem:   Septic shock (HCC), E. coli bacteremia -Weaned off of vasopressors.  Sepsis physiology improved. -Continue IV meropenem, follow blood cultures and sensitivities -Repeat blood cultures today  Active Problems: Suspected acute cholecystitis with E. coli bacteremia, transaminitis, hyperbilirubinemia -Still has right upper quadrant abdominal pain -CT chest/abdomen/pelvis showed distended gallbladder with multiple  tiny stones clustered in the fundus with pericholecystic inflammatory fat stranding concerning for acute cholecystitis.  Short segment of mild wall thickening adjacent to the gallbladder possibly reflecting inflammation of the colon. -GI, surgery following -N.p.o. for HIDA scan however HIDA scan could not be completed today as patient was still on morphine -Seen by general surgery, recommended IR consult for percutaneous cholecystostomy tube, clinically has acute cholecystitis, consult placed     Acute on chronic diastolic CHF (congestive heart failure) (HCC),    Acute respiratory failure with hypoxia and hypercapnia (HCC) -On home O2 2 to 4 L daily.  2D echo 02/08/2023 had shown EF of 65 to 70% with G2 DD - overnight requiring BiPAP however per RN, patient has been refusing -Net + 4.6 L, BNP 134.6, will give Lasix 20 mg IV x 1 -Patient is on CPAP at home nightly, will place on CPAP and counseled on compliance -Wean O2 as tolerated    Atrial fibrillation, chronic (HCC) -Eliquis currently on hold, continue IV heparin drip -Heart rate controlled  AKI on CKD stage 3b, GFR 30-44 ml/min (HCC) -Creatinine on presentation 2.0, was discharged on 7/10 with creatinine of 1.7.  Baseline ~1.6-1.9, has been  gradually trending up -Continue to follow     Hypothyroidism -Continue Synthroid    OSA (obstructive sleep apnea) -Overnight on BiPAP, if refuses, will try CPAP  Chronic venous insufficiency -Venous Dopplers lower extremities negative for DVT     Obesity, Class III, BMI 40-49.9 (morbid obesity) (HCC) Estimated body mass index is 45.35 kg/m as calculated from the following:   Height as of this encounter: 5\' 8"  (1.727 m).   Weight as of this encounter: 135.3 kg.  Code Status: Full code DVT Prophylaxis:  SCDs Start: 02/20/23 1715   Level of Care: Level of care: Progressive Family Communication: Updated patient Disposition Plan:      Remains inpatient appropriate: Transfer to progressive  floor   Procedures:  None  Consultants:   Admitted by CCM GI General surgery  Antimicrobials:   Anti-infectives (From admission, onward)    Start     Dose/Rate Route Frequency Ordered Stop   02/21/23 1700  vancomycin (VANCOCIN) 2,250 mg in sodium chloride 0.9 % 500 mL IVPB  Status:  Discontinued        2,250 mg 261.3 mL/hr over 120 Minutes Intravenous Every 24 hours 02/20/23 2048 02/21/23 1122   02/21/23 1000  meropenem (MERREM) 1 g in sodium chloride 0.9 % 100 mL IVPB        1 g 200 mL/hr over 30 Minutes Intravenous Every 12 hours 02/21/23 0407     02/21/23 0100  piperacillin-tazobactam (ZOSYN) IVPB 3.375 g  Status:  Discontinued        3.375 g 12.5 mL/hr over 240 Minutes Intravenous Every 8 hours 02/20/23 2048 02/21/23 0407   02/20/23 1800  vancomycin (VANCOCIN) IVPB 1000 mg/200 mL premix       Placed in "Followed by" Linked Group   1,000 mg 200 mL/hr over 60 Minutes Intravenous  Once 02/20/23 1623 02/21/23 0028   02/20/23 1630  vancomycin (VANCOREADY) IVPB 1500 mg/300 mL       Placed in "Followed by" Linked Group   1,500 mg 150 mL/hr over 120 Minutes Intravenous  Once 02/20/23 1623 02/20/23 2300   02/20/23 1630  ceFEPIme (MAXIPIME) 2 g in sodium chloride 0.9 % 100 mL IVPB        2 g 200 mL/hr over 30 Minutes Intravenous  Once 02/20/23 1623 02/20/23 1728   02/20/23 1530  metroNIDAZOLE (FLAGYL) IVPB 500 mg        500 mg 100 mL/hr over 60 Minutes Intravenous  Once 02/20/23 1528 02/20/23 1705          Medications  Chlorhexidine Gluconate Cloth  6 each Topical Q0600   levothyroxine  100 mcg Oral Q0600   midodrine  5 mg Oral Q8H   mouth rinse  15 mL Mouth Rinse 4 times per day      Subjective:   Shane Diaz was seen and examined today.  Still has right upper quadrant abdominal pain, no acute nausea vomiting, fevers.  Overnight requires BiPAP  Objective:   Vitals:   02/23/23 0559 02/23/23 0600 02/23/23 0630 02/23/23 0723  BP: 132/70 132/70 106/64   Pulse:  71 71 64   Resp: 17 (!) 23 20   Temp:    97.7 F (36.5 C)  TempSrc:    Oral  SpO2: 100% 100% 100%   Weight:      Height:        Intake/Output Summary (Last 24 hours) at 02/23/2023 0943 Last data filed at 02/23/2023 0600 Gross per 24 hour  Intake 973.75 ml  Output  300 ml  Net 673.75 ml     Wt Readings from Last 3 Encounters:  02/23/23 135.3 kg  02/12/23 (!) 294.2 kg  10/23/22 (!) 149.1 kg     Exam General: Alert and oriented x 3, NAD, ill-appearing Cardiovascular: S1 S2 auscultated,  RRR Respiratory: Diminished breath sounds at the bases, no acute wheezing Gastrointestinal: RUQ TTP , + bowel sounds Ext: chronic venous stasis changes Neuro: moving all 4 extremities Psych: Normal affect     Data Reviewed:  I have personally reviewed following labs    CBC Lab Results  Component Value Date   WBC 12.6 (H) 02/23/2023   RBC 4.17 (L) 02/23/2023   HGB 13.7 02/23/2023   HCT 43.3 02/23/2023   MCV 103.8 (H) 02/23/2023   MCH 32.9 02/23/2023   PLT 126 (L) 02/23/2023   MCHC 31.6 02/23/2023   RDW 15.1 02/23/2023   LYMPHSABS 1.1 02/20/2023   MONOABS 0.2 02/20/2023   EOSABS 0.0 02/20/2023   BASOSABS 0.1 02/20/2023     Last metabolic panel Lab Results  Component Value Date   NA 133 (L) 02/23/2023   K 3.6 02/23/2023   CL 90 (L) 02/23/2023   CO2 30 02/23/2023   BUN 44 (H) 02/23/2023   CREATININE 2.04 (H) 02/23/2023   GLUCOSE 95 02/23/2023   GFRNONAA 33 (L) 02/23/2023   GFRAA 79 08/23/2020   CALCIUM 8.2 (L) 02/23/2023   PHOS 4.0 02/23/2023   PROT 6.2 (L) 02/22/2023   ALBUMIN 2.4 (L) 02/22/2023   LABGLOB 2.9 07/02/2022   AGRATIO 1.5 07/02/2022   BILITOT 1.1 02/22/2023   ALKPHOS 90 02/22/2023   AST 27 02/22/2023   ALT 21 02/22/2023   ANIONGAP 13 02/23/2023    CBG (last 3)  Recent Labs    02/22/23 2317 02/23/23 0321 02/23/23 0722  GLUCAP 79 82 82      Coagulation Profile: No results for input(s): "INR", "PROTIME" in the last 168 hours.   Radiology  Studies: I have personally reviewed the imaging studies  VAS [place] LOWER EXTREMITY VENOUS (DVT)  Result Date: 02/21/2023  Lower Venous DVT Study Patient Name:  Shane Diaz  Date of Exam:   02/21/2023 Medical Rec #: [id number]      Accession #:    [id number] Date of Birth: [birth year]      Patient Gender: M Patient Age:   22 years Exam Location:  [facility] Procedure:      VAS [place] LOWER EXTREMITY VENOUS (DVT) Referring Phys: MURALI RAMASWAMY --------------------------------------------------------------------------------  Indications: Edema.  Risk Factors: HX of LE DVT, chronic venous insufficiency, varicose veins with complications,. Anticoagulation: Eliquis prior to admission & heparin during this admission. Comparison Study: Previous exam on 09/25/21 was negative for DVT. Performing Technologist: Ernestene Mention RVT, RDMS  Examination Guidelines: A complete evaluation includes B-mode imaging, spectral Doppler, color Doppler, and power Doppler as needed of all accessible portions of each vessel. Bilateral testing is considered an integral part of a complete examination. Limited examinations for reoccurring indications may be performed as noted. The reflux portion of the exam is performed with the patient in reverse Trendelenburg.  +---------+---------------+---------+-----------+----------+-------------------+ RIGHT    CompressibilityPhasicitySpontaneityPropertiesThrombus Aging      +---------+---------------+---------+-----------+----------+-------------------+ CFV      Full           Yes      Yes                  Not well visualized +---------+---------------+---------+-----------+----------+-------------------+ SFJ  Not visualized      +---------+---------------+---------+-----------+----------+-------------------+ FV Prox  Full           Yes      Yes                                       +---------+---------------+---------+-----------+----------+-------------------+ FV Mid   Full           Yes      Yes                                      +---------+---------------+---------+-----------+----------+-------------------+ FV DistalFull           Yes      Yes                                      +---------+---------------+---------+-----------+----------+-------------------+ PFV      Full                                                             +---------+---------------+---------+-----------+----------+-------------------+ POP      Full           Yes      Yes                                      +---------+---------------+---------+-----------+----------+-------------------+ PTV      Full                                         Not well visualized +---------+---------------+---------+-----------+----------+-------------------+ PERO     Full                                         Not well visualized +---------+---------------+---------+-----------+----------+-------------------+ Difficulty visualizing CFV & SFJ due to line placement/bandage  Right Technical Findings: Multiphasic arterial flow noted from groin to ankle.  +---------+---------------+---------+-----------+----------+-------------------+ LEFT     CompressibilityPhasicitySpontaneityPropertiesThrombus Aging      +---------+---------------+---------+-----------+----------+-------------------+ CFV      Full           Yes      Yes                                      +---------+---------------+---------+-----------+----------+-------------------+ SFJ      Full                                                             +---------+---------------+---------+-----------+----------+-------------------+ FV Prox  Full           Yes  Yes                                      +---------+---------------+---------+-----------+----------+-------------------+ FV Mid   Full            Yes      Yes                                      +---------+---------------+---------+-----------+----------+-------------------+ FV DistalFull           Yes      Yes                                      +---------+---------------+---------+-----------+----------+-------------------+ PFV      Full                                                             +---------+---------------+---------+-----------+----------+-------------------+ POP      Partial        Yes      Yes                                      +---------+---------------+---------+-----------+----------+-------------------+ PTV      Full                                                             +---------+---------------+---------+-----------+----------+-------------------+ PERO     Full                                         Not well visualized +---------+---------------+---------+-----------+----------+-------------------+   Left Technical Findings: Multiphasic arterial flow noted from groin to ankle.   Summary: BILATERAL: -No evidence of popliteal cyst, bilaterally. RIGHT: - There is no evidence of deep vein thrombosis in the lower extremity. However, portions of this examination were limited- see technologist comments above.  LEFT: - There is no evidence of deep vein thrombosis in the lower extremity.  - Ultrasound characteristics of enlarged lymph nodes noted in the groin.  *See table(s) above for measurements and observations. Electronically signed by Sherald Hess MD on 02/21/2023 at 4:16:32 PM.    Final        Thad Ranger M.D. [facility] 02/23/2023, 9:43 AM  Available via Epic secure chat 7am-7pm After 7 pm, please refer to night coverage provider listed on amion.

## 2023-02-23 NOTE — Progress Notes (Addendum)
NAME:  Shane Diaz, MRN:  [id number], DOB:  1945-10-06, LOS: 3 ADMISSION DATE:  02/20/2023, CONSULTATION DATE:  02/23/23 REFERRING MD:  Dalene Seltzer - EDP, CHIEF COMPLAINT: Shock   History of Present Illness:  77 year old man who presented to [facility] ED 7/19 with acutely worsening SOB and cough. PMHx significant for HTN, HLD, Afib (on Eliquis), HFpEF, chronic respiratory failure on home O2 (2-4LNC), OSA, CKD stage 3b, venous insufficiency, hypothyroidism. Recently admitted for acute CHF exacerbation and discharged home on 7/11 improved after diuresis.   History is obtained from chart review and from patient's wife, at bedside. Patient arrived to the ED on CPAP after having a syncopal event the night before presentation. CXR was consistent with bilateral pleural effusions without clear infiltrate. Patient was hypotensive and given 1L bolus with minimal improvement and started on Levophed. Labs significant for LA 2.5, Na 132, K 3.1, Cr near baseline of 1.9, BNP 187, WBC 15. PCCM consulted for admission.   Patient initially progressed well with transfer to [facility] service and plan for transfer out of ICU 7/22 when he went to IR for percutaneous cholecystostomy placement; on return from IR patient was lethargic, cyanotic with SpO2 in 60s-70s and hypotensive with BP 70s/50s, suspect related to procedural sedation. Patient was placed back on BiPAP with improvement and PCCM reconsulted in this setting.  Pertinent Medical History:   has a past medical history of Acute diastolic heart failure (HCC) (41/32/4401), Arthritis, Diverticulosis (05/2015), Fatty liver (05/14/2015), History of colon polyps (05/2014), Hyperlipidemia, Hypertension, OSA (obstructive sleep apnea) (08/03/2020), Renal cyst (05/2014), Umbilical hernia, and Varicose veins.  Significant Hospital Events: Including procedures, antibiotic start and stop dates in addition to other pertinent events   7/19 Presented to the ED after syncopal event with  increased shortness of breath and hypotension requiring pressors.   7/21 Some issues with pain overnight but well controlled this am. 7/22 IR for percutaneous cholecystostomy.  Interim History / Subjective:  Transferred to [facility] service and plan for transfer out of ICU 7/22 Went to IR for percutaneous cholecystostomy placement On return from IR patient was lethargic, cyanotic with SpO2 in 60s-70s and hypotensive with BP 70s/50s, suspect related to procedural sedation Patient was placed back on BiPAP with improvement and PCCM reconsulted CXR pending Wife updated at bedside  Objective   Blood pressure 123/80, pulse (!) 128, temperature 98.4 F (36.9 C), temperature source Oral, resp. rate (!) 27, height 5\' 8"  (1.727 m), weight 135.3 kg, SpO2 (!) 61%.  Tmax yesterday at 3:06 PM 101.7.  Presents afebrile.  Blood pressure MAP 77-92 on 6 of Levophed.  Satting at 94-97% on BiPAP FiO2 50%.  Pulse 43-70s    Vent Mode: PCV;BIPAP FiO2 (%):  [50 %-100 %] 100 % Set Rate:  [16 bmp] 16 bmp PEEP:  [5 cmH20-8 cmH20] 8 cmH20   Intake/Output Summary (Last 24 hours) at 02/23/2023 1648 Last data filed at 02/23/2023 1500 Gross per 24 hour  Intake 965.28 ml  Output 300 ml  Net 665.28 ml   Filed Weights   02/21/23 1300 02/22/23 0500 02/23/23 0440  Weight: 128.8 kg 133.4 kg 135.3 kg   Physical Examination: General: Acutely ill-appearing elderly man in NAD. Mildly drowsy on BiPAP. HEENT: [place]/AT, anicteric sclera, PERRL, dry mucous membranes. Neuro:  Awake, slightly drowsy but nodding appropriately to questions/following commands.  Responds to verbal stimuli. Following commands consistently. CV: Irregular rhythm, no m/g/r. Rates variable. PULM: Breathing even and unlabored on BiPAP. Lung fields diminished throughout. GI: Obese, soft, mildly distended; focally  TTP near cholecystostomy site. Hypoactive bowel sounds. Extremities: Trace bilateral symmetric LE edema noted. Skin: Warm/dry, no rashes. Lips with  mild cyanosis.  Resolved Hospital Problem List   Septic shock   Assessment & Plan:  E. coli bacteremia due to suspected acute cholecystitis  Elevated LFTs Elevated bilirubin Severe RUQ pain overnight improved with opoids. RUQ sonogram showed stones and sludge in the gallbladder without CBD dilatation. Noncontrast CT Chest/A/P showed a distended gallbladder filled with multiple tiny stones clustered in the fundus with pericholecystic inflammatory fat stranding concerning for acute cholecystitis. A short segment of mild wall thickening adjacent to the gallbladder was noted, possibly reflecting inflammation of the colon. Underwent IR cholecystostomy tube placement 7/22. - GI and CCS following, appreciate recommendations - S/p IR cholecystostomy placement 7/22 - Episode of lethargy with hypotension and desaturation to 70s post-procedure, suspect sedation-related - Recovered with BiPAP, no additional interventions required - Continue meropenem - Multimodal pain control - HIDA scan pending  Acute on chronic hypoxemic respiratory failure likely secondary to heart failure Transient hypoxemia s/p IR procedure OSA Patient is on home oxygen 2-4 L daily per daughter at bedside; per patient's wife primarily only uses at night. Previous sleep study/notes mention need for auto-regulating CPAP machine but cannot find DME order in chart, unclear if patient actually utilizes this at home. - Supplemental O2 support for SpO2 > 90% - BiPAP QHS + PRN - Pulmonary hygiene - F/u CXR  HFpEF Echo 7/7 EF 65 to 70% with grade 2 diastolic dysfunction Chronic atrial fibrillation anticoagulated with Eliquis - Cardiac monitoring - Optimize electrolytes for K > 4, Mg > 2 - Heparin gtt for AC, transition back to Eliquis once no further procedures planned - Monitor I&Os - Resumption of GDMT once able to tolerate from a hemodynamic standpoint  Micturition syncope Echo 7/7 EF 65-70%, no RWMAs, mild asymmetric LVH,  G2DD. - Cardiac monitoring - EKG wi incomplete RBBB, ?R ventricular hypertrophy with repolarization abnormality, T wave abnormality, prolonged QT (QTc 570) - Optimize electrolytes for K > 4, Mg > 2  Chronic venous stasis skin changes LE Dopplers 7/20 negative for DVT. - Local wound care  Hypothyroidism - Continue Synthroid  AKI on CKD stage 4 Baseline creatinine seems to be around 1.3-1.5. - Trend BMP - Replete electrolytes as indicated - Monitor I&Os - Avoid nephrotoxic agents as able - Ensure adequate renal perfusion  Best Practice: (right click and "Reselect all SmartList Selections" daily)   Diet/type: NPO ok for sips w/ meds DVT prophylaxis: DOAC GI prophylaxis: N/A Lines: N/A Foley:  N/A Code Status:  full code Last date of multidisciplinary goals of care discussion [updated daughter at bedside]  Critical care time:   The patient is critically ill with multiple organ system failure and requires high complexity decision making for assessment and support, frequent evaluation and titration of therapies, advanced monitoring, review of radiographic studies and interpretation of complex data.   Critical Care Time devoted to patient care services, exclusive of separately billable procedures, described in this note is 31 minutes.  Tim Lair, PA-C [facility] 02/23/23 5:13 PM  Please see [email or web address] for pager details.  From 7A-7P if no response, please call [phone] After hours, please call ELink [phone]

## 2023-02-23 NOTE — Progress Notes (Signed)
Subjective/Chief Complaint: C/o ruq pain   Objective: Vital signs in last 24 hours: Temp:  [97.4 F (36.3 C)-98.6 F (37 C)] 97.7 F (36.5 C) (07/22 0723) Pulse Rate:  [48-79] 64 (07/22 0630) Resp:  [15-26] 20 (07/22 0630) BP: (89-132)/(51-81) 106/64 (07/22 0630) SpO2:  [82 %-100 %] 100 % (07/22 0630) Arterial Line BP: (106-126)/(51-63) 126/63 (07/21 1430) FiO2 (%):  [50 %] 50 % (07/22 0600) Weight:  [135.3 kg] 135.3 kg (07/22 0440) Last BM Date :  (PTA)  Intake/Output from previous day: 07/21 0701 - 07/22 0700 In: 999.7 [I.V.:299; IV Piggyback:700.7] Out: 300 [Urine:300] Intake/Output this shift: No intake/output data recorded.  Sclera anicteric Ab soft obese tender ruq with murphys sign  Lab Results:  Recent Labs    02/22/23 0618 02/22/23 1351 02/23/23 0439  WBC 19.1*  --  12.6*  HGB 14.7 14.6 13.7  HCT 45.6 43.0 43.3  PLT 142*  --  126*   BMET Recent Labs    02/22/23 0618 02/22/23 1351 02/23/23 0439  NA 133* 132* 133*  K 4.0 3.8 3.6  CL 90*  --  90*  CO2 29  --  30  GLUCOSE 87  --  95  BUN 39*  --  44*  CREATININE 2.21*  --  2.04*  CALCIUM 8.6*  --  8.2*   PT/INR No results for input(s): "LABPROT", "INR" in the last 72 hours. ABG Recent Labs    02/20/23 1927 02/22/23 1351  PHART 7.469* 7.368  HCO3 36.1* 33.4*    Studies/Results: VAS [place] LOWER EXTREMITY VENOUS (DVT)  Result Date: 02/21/2023  Lower Venous DVT Study Patient Name:  Shane Diaz  Date of Exam:   02/21/2023 Medical Rec #: [id number]      Accession #:    [id number] Date of Birth: October 18, 1945      Patient Gender: M Patient Age:   77 years Exam Location:  [facility] Procedure:      VAS [place] LOWER EXTREMITY VENOUS (DVT) Referring Phys: MURALI RAMASWAMY --------------------------------------------------------------------------------  Indications: Edema.  Risk Factors: HX of LE DVT, chronic venous insufficiency, varicose veins with complications,. Anticoagulation: Eliquis prior to  admission & heparin during this admission. Comparison Study: Previous exam on 09/25/21 was negative for DVT. Performing Technologist: Ernestene Mention RVT, RDMS  Examination Guidelines: A complete evaluation includes B-mode imaging, spectral Doppler, color Doppler, and power Doppler as needed of all accessible portions of each vessel. Bilateral testing is considered an integral part of a complete examination. Limited examinations for reoccurring indications may be performed as noted. The reflux portion of the exam is performed with the patient in reverse Trendelenburg.  +---------+---------------+---------+-----------+----------+-------------------+ RIGHT    CompressibilityPhasicitySpontaneityPropertiesThrombus Aging      +---------+---------------+---------+-----------+----------+-------------------+ CFV      Full           Yes      Yes                  Not well visualized +---------+---------------+---------+-----------+----------+-------------------+ SFJ                                                   Not visualized      +---------+---------------+---------+-----------+----------+-------------------+ FV Prox  Full           Yes      Yes                                      +---------+---------------+---------+-----------+----------+-------------------+  FV Mid   Full           Yes      Yes                                      +---------+---------------+---------+-----------+----------+-------------------+ FV DistalFull           Yes      Yes                                      +---------+---------------+---------+-----------+----------+-------------------+ PFV      Full                                                             +---------+---------------+---------+-----------+----------+-------------------+ POP      Full           Yes      Yes                                      +---------+---------------+---------+-----------+----------+-------------------+  PTV      Full                                         Not well visualized +---------+---------------+---------+-----------+----------+-------------------+ PERO     Full                                         Not well visualized +---------+---------------+---------+-----------+----------+-------------------+ Difficulty visualizing CFV & SFJ due to line placement/bandage  Right Technical Findings: Multiphasic arterial flow noted from groin to ankle.  +---------+---------------+---------+-----------+----------+-------------------+ LEFT     CompressibilityPhasicitySpontaneityPropertiesThrombus Aging      +---------+---------------+---------+-----------+----------+-------------------+ CFV      Full           Yes      Yes                                      +---------+---------------+---------+-----------+----------+-------------------+ SFJ      Full                                                             +---------+---------------+---------+-----------+----------+-------------------+ FV Prox  Full           Yes      Yes                                      +---------+---------------+---------+-----------+----------+-------------------+ FV Mid   Full           Yes      Yes                                      +---------+---------------+---------+-----------+----------+-------------------+  FV DistalFull           Yes      Yes                                      +---------+---------------+---------+-----------+----------+-------------------+ PFV      Full                                                             +---------+---------------+---------+-----------+----------+-------------------+ POP      Partial        Yes      Yes                                      +---------+---------------+---------+-----------+----------+-------------------+ PTV      Full                                                              +---------+---------------+---------+-----------+----------+-------------------+ PERO     Full                                         Not well visualized +---------+---------------+---------+-----------+----------+-------------------+   Left Technical Findings: Multiphasic arterial flow noted from groin to ankle.   Summary: BILATERAL: -No evidence of popliteal cyst, bilaterally. RIGHT: - There is no evidence of deep vein thrombosis in the lower extremity. However, portions of this examination were limited- see technologist comments above.  LEFT: - There is no evidence of deep vein thrombosis in the lower extremity.  - Ultrasound characteristics of enlarged lymph nodes noted in the groin.  *See table(s) above for measurements and observations. Electronically signed by Sherald Hess MD on 02/21/2023 at 4:16:32 PM.    Final     Anti-infectives: Anti-infectives (From admission, onward)    Start     Dose/Rate Route Frequency Ordered Stop   02/21/23 1700  vancomycin (VANCOCIN) 2,250 mg in sodium chloride 0.9 % 500 mL IVPB  Status:  Discontinued        2,250 mg 261.3 mL/hr over 120 Minutes Intravenous Every 24 hours 02/20/23 2048 02/21/23 1122   02/21/23 1000  meropenem (MERREM) 1 g in sodium chloride 0.9 % 100 mL IVPB        1 g 200 mL/hr over 30 Minutes Intravenous Every 12 hours 02/21/23 0407     02/21/23 0100  piperacillin-tazobactam (ZOSYN) IVPB 3.375 g  Status:  Discontinued        3.375 g 12.5 mL/hr over 240 Minutes Intravenous Every 8 hours 02/20/23 2048 02/21/23 0407   02/20/23 1800  vancomycin (VANCOCIN) IVPB 1000 mg/200 mL premix       Placed in "Followed by" Linked Group   1,000 mg 200 mL/hr over 60 Minutes Intravenous  Once 02/20/23 1623 02/21/23 0028   02/20/23 1630  vancomycin (VANCOREADY) IVPB 1500 mg/300 mL       Placed in "  Followed by" Linked Group   1,500 mg 150 mL/hr over 120 Minutes Intravenous  Once 02/20/23 1623 02/20/23 2300   02/20/23 1630  ceFEPIme (MAXIPIME) 2 g  in sodium chloride 0.9 % 100 mL IVPB        2 g 200 mL/hr over 30 Minutes Intravenous  Once 02/20/23 1623 02/20/23 1728   02/20/23 1530  metroNIDAZOLE (FLAGYL) IVPB 500 mg        500 mg 100 mL/hr over 60 Minutes Intravenous  Once 02/20/23 1528 02/20/23 1705       Assessment/Plan: 77 year old male with possible cholecystitis. Septic shock History of A-fib Hypertension OSA DVTs CAD Chronic heart failure   1. HIDA pending.  If positive I think he would benefit from IR cholecystostomy tube. He should get this today regardless. I think he clinically has cholecystitis so if will not do hida would ask IR just to see him.  2.  Agree with continued antibiotics. WBC improving  Emelia Loron 02/23/2023

## 2023-02-23 NOTE — Progress Notes (Signed)
Administered morphine 1mg  and wasted 1mg  of morphine. Witnessed by Agapito Games, RN

## 2023-02-23 NOTE — Procedures (Signed)
Arterial Catheter Insertion Procedure Note  Shane Diaz  [id number]  1946/03/07  Date:02/23/23  Time:10:24 PM    Provider Performing: Allena Napoleon    Procedure: Insertion of Arterial Line (40981) without US guidance  Indication(s) Blood pressure monitoring and/or need for frequent ABGs  Consent Unable to obtain consent due to emergent nature of procedure.  Anesthesia None   Time Out Verified patient identification, verified procedure, site/side was marked, verified correct patient position, special equipment/implants available, medications/allergies/relevant history reviewed, required imaging and test results available.   Sterile Technique Maximal sterile technique including full sterile barrier drape, hand hygiene, sterile gown, sterile gloves, mask, hair covering, sterile ultrasound probe cover (if used).   Procedure Description Area of catheter insertion was cleaned with chlorhexidine and draped in sterile fashion. Without real-time ultrasound guidance an arterial catheter was placed into the left radial artery.  Appropriate arterial tracings confirmed on monitor.     Complications/Tolerance None; patient tolerated the procedure well.   EBL Minimal   Specimen(s) None

## 2023-02-23 NOTE — TOC Initial Note (Addendum)
Transition of Care [facility]) - Initial/Assessment Note    Patient Details  Name: Shane Diaz MRN: [id number] Date of Birth: [birth year]  [facility] CM/SW Contact:    Harriet Masson, RN Phone Number: 02/23/2023, 3:29 PM  Clinical Narrative:                 Patient recently discharge from hospital on 02/12/23 with [name] RN, PT,OT and new bipap. Patient lives with wife and has home 02 from [facility].   PCP verified. Information obtained from chart due to patient at procedure.   Expected Discharge Plan: Home w Home Health Services Barriers to Discharge: Continued Medical Work up   Patient Goals and CMS Choice            Expected Discharge Plan and Services                                              Prior Living Arrangements/Services     Patient language and need for interpreter reviewed:: Yes Do you feel safe going back to the place where you live?: Yes      Need for Family Participation in Patient Care: Yes (Comment) Care giver support system in place?: Yes (comment)   Criminal Activity/Legal Involvement Pertinent to Current Situation/Hospitalization: No - Comment as needed  Activities of Daily Living      Permission Sought/Granted                  Emotional Assessment              Admission diagnosis:  SOB (shortness of breath) [R06.02] Septic shock (HCC) [A41.9, R65.21] Patient Active Problem List   Diagnosis Date Noted   Acute cholecystitis 02/23/2023   E coli bacteremia 02/23/2023   Septic shock (HCC) 02/20/2023   CKD stage 3b, GFR 30-44 ml/min (HCC) 02/07/2023   Acute hypoxic respiratory failure (HCC) 02/06/2023   Acute on chronic respiratory failure with hypoxia and hypercapnia (HCC) 02/06/2023   Obesity hypoventilation syndrome (HCC) 02/06/2023   Right-sided heart failure (HCC) 02/06/2023   Cor pulmonale (chronic) (HCC) 02/06/2023   Diastolic heart failure preserved EF (congestive heart failure)  02/06/2023   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   Chronic venous insufficiency 02/06/2023   History of DVT 2015 (deep vein thrombosis) 02/06/2023   Hypothyroidism 02/06/2023   Acute respiratory failure with hypoxia and hypercapnia (HCC) 02/06/2023   Acute on chronic diastolic CHF (congestive heart failure) (HCC) 02/06/2023   Acute metabolic encephalopathy 02/06/2023   Posterior vitreous detachment of right eye 04/15/2022   Exudative age-related macular degeneration of left eye with active choroidal neovascularization (HCC) 04/15/2022   Early stage nonexudative age-related macular degeneration of both eyes 04/15/2022   Vitreomacular adhesion of left eye 04/15/2022   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   Hyperkalemia 09/16/2021   Acute on chronic diastolic HF (heart failure) (HCC)    Chronic wound bilateral lower extremity 2nd from chronic venous insufficiency 09/14/2021   Cellulitis- left leg 09/13/2021   Fall 09/13/2021   Chronic respiratory failure with hypoxia-2 to 4L L oxygen at baseline (HCC) 09/13/2021   Atrial flutter (HCC) 03/18/2021   Chronic heart failure with preserved ejection fraction (HFpEF) (HCC) 03/18/2021   Prolonged QT interval 03/18/2021   Acute on chronic diastolic congestive heart failure (HCC)    Cellulitis of leg, right 09/24/2020   Pneumonia due to infectious  organism 09/24/2020   Obesity, Class III, BMI 40-49.9 (morbid obesity) (HCC) 09/24/2020   Lactic acidosis 09/24/2020   Sepsis due to pneumonia (HCC) 09/24/2020   Acute diastolic heart failure (HCC) 08/03/2020   OSA (obstructive sleep apnea) 08/03/2020   DVT, bilateral lower limbs (HCC) hx of 08/03/2020   Coronary artery calcification seen on CT scan 08/03/2020   Atrial fibrillation with RVR (HCC) 08/01/2020   Iron deficiency anemia    Occult blood in stools    Atrial fibrillation, chronic (HCC) 07/07/2020   Pneumonia due to COVID-19 virus 05/08/2020   Sepsis (HCC) 05/08/2020   Atrial fibrillation with rapid  ventricular response (HCC) 05/08/2020   Elevated troponin 05/08/2020   Essential hypertension 05/08/2020   Varicose veins of leg with complications 06/25/2015   Varicose veins of left lower extremity with complications 06/18/2015   Varicose veins of bilateral lower extremities with other complications 05/29/2015   Special screening for malignant neoplasms, colon 04/11/2014   PCP:  Ralene Ok, MD Pharmacy:   [facility] [id number] - [place], [place] - [address] [place] [address] Phone: [phone] Fax: [phone]  [facility] [address] [place] [address] Phone: [phone] Fax: [phone]     Social Determinants of Health (SDOH) Social History: SDOH Screenings   Food Insecurity: No Food Insecurity (02/07/2023)  Housing: Low Risk  (02/07/2023)  Transportation Needs: No Transportation Needs (02/07/2023)  Utilities: Not At Risk (02/07/2023)  Alcohol Screen: Low Risk  (02/09/2023)  Financial Resource Strain: Low Risk  (02/09/2023)  Tobacco Use: Low Risk  (02/20/2023)   SDOH Interventions:     Readmission Risk Interventions     No data to display

## 2023-02-23 NOTE — Progress Notes (Signed)
Patient returned from IR w/ HR in 120s and O2 sats in the 60s/70s. Got EKG and placed patient on BiPAP. Notified Rai, MD and PCCM. CXR and Blood gas ordered.

## 2023-02-23 NOTE — Progress Notes (Addendum)
ANTICOAGULATION CONSULT NOTE - Follow Up Consult  Pharmacy Consult for Eliquis > Heparin  Indication: atrial fibrillation  Allergies  Allergen Reactions   Diltiazem     rash   Amiodarone Rash    Amiodarone DC'd 02/07/2023 due to concern causing hypothyroidism.     Patient Measurements: Height: 5\' 8"  (172.7 cm) Weight: 135.3 kg (298 lb 4.5 oz) IBW/kg (Calculated) : 68.4 Heparin Dosing Weight: 98.5kg   Vital Signs: Temp: 97.8 F (36.6 C) (07/22 1123) Temp Source: Oral (07/22 1123) BP: 104/59 (07/22 1230) Pulse Rate: 70 (07/22 1230)  Labs: Recent Labs    02/21/23 0341 02/21/23 0845 02/21/23 1045 02/21/23 2109 02/22/23 0340 02/22/23 0618 02/22/23 1351 02/22/23 1415 02/22/23 1945 02/23/23 0439 02/23/23 0855 02/23/23 1109  HGB 16.8  --   --   --   --  14.7 14.6  --   --  13.7  --   --   HCT 51.2  --   --   --   --  45.6 43.0  --   --  43.3  --   --   PLT 209  --   --   --   --  142*  --   --   --  126*  --   --   APTT  --   --   --  98*   < >  --   --  93* 97*  --  82*  --   LABPROT  --   --   --   --   --   --   --   --   --   --   --  17.2*  INR  --   --   --   --   --   --   --   --   --   --   --  1.4*  HEPARINUNFRC  --   --   --  >1.10*  --   --   --   --   --   --  >1.10*  --   CREATININE 2.05*  --   --  2.03*  --  2.21*  --   --   --  2.04*  --   --   TROPONINIHS  --    < > 54* 44*  --  29*  --   --   --   --   --   --    < > = values in this interval not displayed.    Estimated Creatinine Clearance: 40.8 mL/min (A) (by C-G formula based on SCr of 2.04 mg/dL (H)).  Assessment: 26 yom presenting with septic shock from ESBL E.Coli bacteremia from cholecystitis. On Eliquis PTA for afib, was ordered, however not given as patient was on BiPAP. Unknown last dose but at least > 24 hours prior to heparin start.  aPTT remained therapeutic at 82 this morning. Heparin level elevated due to apixaban, as expected. Heparin infusion currently off for IR procedure with plan  to resume 2 hrs post-op per IR team. Hg WNL, plt trending down to 126. No bleed issues reported.  Goal of Therapy:  Heparin level 0.3-0.7 units/ml aPTT 66-102 Monitor platelets by anticoagulation protocol: Yes   Plan:  Plan to resume heparin infusion at previous therapeutic rate 1300 units/hr 2 hrs post-IR procedure Monitor daily aPTT/heparin level until correlating, CBC, s/sx bleeding   Leia Alf, PharmD, BCPS Please check AMION for all [facility] contact numbers Clinical Pharmacist  02/23/2023 12:58 PM  ADDENDUM: Patient s/p percutaneous cholecystostomy drain in IR ~1500. Per IR team request, heparin to resume 2 hrs post-procedure.  Plan: Resume heparin at previous therapeutic rate 1300 units/hr at 1700 Monitor daily aPTT/heparin level until correlating, CBC, s/sx bleeding   Leia Alf, PharmD, BCPS Please check AMION for all [facility] contact numbers Clinical Pharmacist 02/23/2023 3:21 PM

## 2023-02-23 NOTE — Progress Notes (Addendum)
eLink Physician-Brief Progress Note Patient Name: Shane Diaz DOB: 1946-07-21 MRN: [id number]   Date of Service  02/23/2023  HPI/Events of Note  77 year old male who initially presented with septic shock secondary to peritonitis complicated by respiratory failure in the setting of likely cholecystitis.  Status post IR guided cholecystostomy tube placement today.  Return from procedural area with increased somnolence and some hypotension.  Of note, he received a 500 cc LR bolus earlier in the day.  Had moderate response to this but he also held his midodrine earlier today.  Ongoing ectopy.  Neurologically stable, respiratory status is stable.  eICU Interventions  Will initiate norepinephrine as needed to maintain MAP greater than 60  Resume his midodrine, hopefully he will need nor epi for long once this is administered  Additional 500 cc NS bolus.,  Unmeasured output from cholecystostomy drain lost due to tube malfunction.   2105 -having periods of intermittent bradycardia.  Previously, he had electrolyte disturbances contributing to his arrhythmias.  Will recheck labs.    2134 -ongoing intermittent bradycardia.  Persistent hypotension, peripheral norepinephrine up at 12.  Will obtain ABG and place arterial line.  Pending labs.  2229 - Add 60 KCL IV , add vasopressin, will request ground team eval for CVC   Intervention Category Major Interventions: Hypotension - evaluation and management  Ardath Lepak 02/23/2023, 7:47 PM

## 2023-02-23 NOTE — Progress Notes (Signed)
At 1930 Patient BP 76/40 (51), diaphoretic. MD notified ordered 500NS bolus, and levophed PIV, US guided ordered and placed, IV watch in place.  Update 2120: Levo rate at PIV elink notified. BP 84/42 (54).

## 2023-02-24 DIAGNOSIS — A419 Sepsis, unspecified organism: Secondary | ICD-10-CM | POA: Diagnosis not present

## 2023-02-24 DIAGNOSIS — R6521 Severe sepsis with septic shock: Secondary | ICD-10-CM | POA: Diagnosis not present

## 2023-02-24 DIAGNOSIS — E662 Morbid (severe) obesity with alveolar hypoventilation: Secondary | ICD-10-CM

## 2023-02-24 DIAGNOSIS — J9601 Acute respiratory failure with hypoxia: Secondary | ICD-10-CM | POA: Diagnosis not present

## 2023-02-24 LAB — BASIC METABOLIC PANEL
Anion gap: 17 — ABNORMAL HIGH (ref 5–15)
BUN: 50 mg/dL — ABNORMAL HIGH (ref 8–23)
CO2: 23 mmol/L (ref 22–32)
Calcium: 8.4 mg/dL — ABNORMAL LOW (ref 8.9–10.3)
Chloride: 95 mmol/L — ABNORMAL LOW (ref 98–111)
Creatinine, Ser: 2.37 mg/dL — ABNORMAL HIGH (ref 0.61–1.24)
GFR, Estimated: 28 mL/min — ABNORMAL LOW (ref >60)
Glucose, Bld: 148 mg/dL — ABNORMAL HIGH (ref 70–99)
Potassium: 4.4 mmol/L (ref 3.5–5.1)
Sodium: 135 mmol/L (ref 135–145)

## 2023-02-24 LAB — CBC
HCT: 43.6 % (ref 39.0–52.0)
Hemoglobin: 14.2 g/dL (ref 13.0–17.0)
MCH: 33.1 pg (ref 26.0–34.0)
MCHC: 32.6 g/dL (ref 30.0–36.0)
MCV: 101.6 fL — ABNORMAL HIGH (ref 80.0–100.0)
Platelets: 139 10*3/uL — ABNORMAL LOW (ref 150–400)
RBC: 4.29 MIL/uL (ref 4.22–5.81)
RDW: 15.3 % (ref 11.5–15.5)
WBC: 26 10*3/uL — ABNORMAL HIGH (ref 4.0–10.5)
nRBC: 0 % (ref 0.0–0.2)

## 2023-02-24 LAB — GLUCOSE, CAPILLARY
Glucose-Capillary: 142 mg/dL — ABNORMAL HIGH (ref 70–99)
Glucose-Capillary: 143 mg/dL — ABNORMAL HIGH (ref 70–99)
Glucose-Capillary: 147 mg/dL — ABNORMAL HIGH (ref 70–99)
Glucose-Capillary: 106 mg/dL — ABNORMAL HIGH (ref 70–99)
Glucose-Capillary: 109 mg/dL — ABNORMAL HIGH (ref 70–99)
Glucose-Capillary: 101 mg/dL — ABNORMAL HIGH (ref 70–99)

## 2023-02-24 LAB — POCT I-STAT 7, (LYTES, BLD GAS, ICA,H+H)
Acid-Base Excess: 6 mmol/L — ABNORMAL HIGH (ref 0.0–2.0)
Bicarbonate: 31.7 mmol/L — ABNORMAL HIGH (ref 20.0–28.0)
Calcium, Ion: 1.09 mmol/L — ABNORMAL LOW (ref 1.15–1.40)
HCT: 45 % (ref 39.0–52.0)
Hemoglobin: 15.3 g/dL (ref 13.0–17.0)
O2 Saturation: 100 %
Patient temperature: 98.8
Potassium: 6.2 mmol/L — ABNORMAL HIGH (ref 3.5–5.1)
Sodium: 132 mmol/L — ABNORMAL LOW (ref 135–145)
TCO2: 33 mmol/L — ABNORMAL HIGH (ref 22–32)
pCO2 arterial: 51.1 mmHg — ABNORMAL HIGH (ref 32–48)
pH, Arterial: 7.402 (ref 7.35–7.45)
pO2, Arterial: 196 mmHg — ABNORMAL HIGH (ref 83–108)

## 2023-02-24 LAB — APTT
aPTT: 58 seconds — ABNORMAL HIGH (ref 24–36)
aPTT: 68 seconds — ABNORMAL HIGH (ref 24–36)

## 2023-02-24 LAB — CULTURE, BLOOD (ROUTINE X 2): Culture: NO GROWTH

## 2023-02-24 LAB — MAGNESIUM: Magnesium: 2.1 mg/dL (ref 1.7–2.4)

## 2023-02-24 LAB — AEROBIC/ANAEROBIC CULTURE W GRAM STAIN (SURGICAL/DEEP WOUND)

## 2023-02-24 LAB — HEPARIN LEVEL (UNFRACTIONATED): Heparin Unfractionated: >1.1 IU/mL — ABNORMAL HIGH (ref 0.30–0.70)

## 2023-02-24 LAB — PHOSPHORUS: Phosphorus: 3.3 mg/dL (ref 2.5–4.6)

## 2023-02-24 LAB — TROPONIN I (HIGH SENSITIVITY): Troponin I (High Sensitivity): 37 ng/L — ABNORMAL HIGH (ref <18)

## 2023-02-24 LAB — AMMONIA: Ammonia: 24 umol/L (ref 9–35)

## 2023-02-24 NOTE — Progress Notes (Signed)
ANTICOAGULATION CONSULT NOTE - Follow Up Consult  Pharmacy Consult for Eliquis > Heparin  Indication: atrial fibrillation  Allergies  Allergen Reactions   Diltiazem     rash   Amiodarone Rash    Amiodarone DC'd 02/07/2023 due to concern causing hypothyroidism.     Patient Measurements: Height: 5\' 8"  (172.7 cm) Weight: 135.1 kg (297 lb 13.5 oz) IBW/kg (Calculated) : 68.4 Heparin Dosing Weight: 98.5kg   Vital Signs: Temp: 98.1 F (36.7 C) (07/23 1522) Temp Source: Oral (07/23 1522) BP: 100/52 (07/23 1400) Pulse Rate: 67 (07/23 1400)  Labs: Recent Labs    02/21/23 2109 02/22/23 0340 02/22/23 0618 02/22/23 1351 02/23/23 0439 02/23/23 0855 02/23/23 1109 02/23/23 1611 02/23/23 2145 02/23/23 2225 02/24/23 0306 02/24/23 0325 02/24/23 0404 02/24/23 0600 02/24/23 1545  HGB  --   --  14.7   < > 13.7  --   --    < > 13.9 15.3 15.3 14.2  --   --   --   HCT  --   --  45.6   < > 43.3  --   --    < > 43.9 45.0 45.0 43.6  --   --   --   PLT  --    < > 142*  --  126*  --   --   --  113*  --   --  139*  --   --   --   APTT 98*   < >  --    < >  --  82*  --   --   --   --   --   --  58*  --  68*  LABPROT  --   --   --   --   --   --  17.2*  --   --   --   --   --   --   --   --   INR  --   --   --   --   --   --  1.4*  --   --   --   --   --   --   --   --   HEPARINUNFRC >1.10*  --   --   --   --  >1.10*  --   --   --   --   --   --  >1.10*  --   --   CREATININE 2.03*  --  2.21*  --  2.04*  --   --   --  2.35*  --   --   --   --  2.37*  --   TROPONINIHS 44*  --  29*  --   --   --   --   --  40*  --   --  37*  --   --   --    < > = values in this interval not displayed.    Estimated Creatinine Clearance: 35.1 mL/min (A) (by C-G formula based on SCr of 2.37 mg/dL (H)).  Assessment: 64 yom presenting with septic shock from ESBL E.Coli bacteremia from cholecystitis. On Eliquis PTA for afib, was ordered, however not given as patient was on BiPAP. Unknown last dose but at least > 24  hours prior to heparin start.  aPTT remained therapeutic at 82 this morning. Heparin level elevated due to apixaban, as expected. Heparin infusion currently off for IR procedure with plan to resume 2 hrs post-op per  IR team. Hg WNL, plt trending down to 126. No bleed issues reported.  Patient s/p percutaneous cholecystostomy drain in IR ~1500. Per IR team request, heparin to resume 2 hrs post-procedure at 1700  7/23: aPTT 68 seconds (therapeutic) on 1450 units/hr. No issues with infusion or bleeding problems reported.  Goal of Therapy:  Heparin level 0.3-0.7 units/ml aPTT 66-102 Monitor platelets by anticoagulation protocol: Yes   Plan:  Continue heparin infusion at 1450 units/hr Check aPTT in 8 hours to confirm therapeutic Monitor daily aPTT/heparin level until correlating, CBC, s/sx bleeding  Loralee Pacas, PharmD, BCPS 02/24/2023 4:38 PM   Please check AMION for all [facility] phone numbers After 10:00 PM, call Main Pharmacy [phone]

## 2023-02-24 NOTE — Progress Notes (Signed)
   Patient Name: Zoey Gilkeson Date of Encounter: 02/24/2023 [facility] Cardiologist: Thurmon Fair, MD   Interval Summary  .    More alert in bed  Vital Signs .    Vitals:   02/24/23 0600 02/24/23 0615 02/24/23 0630 02/24/23 0727  BP:      Pulse: (!) 57 (!) 54 60   Resp: 14 15 15    Temp:    98.6 F (37 C)  TempSrc:    Axillary  SpO2: 100% 100% 100%   Weight:      Height:        Intake/Output Summary (Last 24 hours) at 02/24/2023 1012 Last data filed at 02/24/2023 0600 Gross per 24 hour  Intake 1929.82 ml  Output 775 ml  Net 1154.82 ml      02/24/2023    4:34 AM 02/23/2023    4:40 AM 02/22/2023    5:00 AM  Last 3 Weights  Weight (lbs) 297 lb 13.5 oz 298 lb 4.5 oz 294 lb 1.5 oz  Weight (kg) 135.1 kg 135.3 kg 133.4 kg      Telemetry/ECG    Intermittent Wenckebach - Personally Reviewed  Physical Exam .   GEN: No acute distress.   Neck: No JVD Cardiac: Ectopy RRR, no murmurs, rubs, or gallops.  Respiratory: Clear to auscultation bilaterally. GI: Soft, nontender, non-distended  MS: No edema  Assessment & Plan .     Wenckebach --avoiding Bb. On Norepi. No pacer indications. Now NSR 60's. Excellent.   PAF --Now in SR with ectopy PAC --On heparin IV , off eliquis  Ecoli sepsis, cholecystitis - perc drain. Surgery notes reviewed --discussed with wife  Will sign off. Please call if ?  For questions or updates, please contact [facility] Please consult [email or web address] for contact info under        Signed, Donato Schultz, MD

## 2023-02-24 NOTE — Progress Notes (Signed)
ANTICOAGULATION CONSULT NOTE - Follow Up Consult  Pharmacy Consult for Eliquis > Heparin  Indication: atrial fibrillation  Allergies  Allergen Reactions   Diltiazem     rash   Amiodarone Rash    Amiodarone DC'd 02/07/2023 due to concern causing hypothyroidism.     Patient Measurements: Height: 5\' 8"  (172.7 cm) Weight: 135.1 kg (297 lb 13.5 oz) IBW/kg (Calculated) : 68.4 Heparin Dosing Weight: 98.5kg   Vital Signs: Temp: 98.7 F (37.1 C) (07/23 0321) Temp Source: Axillary (07/23 0321) BP: 127/55 (07/23 0400) Pulse Rate: 62 (07/23 0445)  Labs: Recent Labs    02/21/23 2109 02/22/23 0340 02/22/23 0618 02/22/23 1351 02/22/23 1945 02/23/23 0439 02/23/23 0855 02/23/23 1109 02/23/23 1611 02/23/23 2145 02/23/23 2225 02/24/23 0306 02/24/23 0325 02/24/23 0404  HGB  --   --  14.7   < >  --  13.7  --   --    < > 13.9 15.3 15.3 14.2  --   HCT  --   --  45.6   < >  --  43.3  --   --    < > 43.9 45.0 45.0 43.6  --   PLT  --    < > 142*  --   --  126*  --   --   --  113*  --   --  139*  --   APTT 98*   < >  --    < > 97*  --  82*  --   --   --   --   --   --  58*  LABPROT  --   --   --   --   --   --   --  17.2*  --   --   --   --   --   --   INR  --   --   --   --   --   --   --  1.4*  --   --   --   --   --   --   HEPARINUNFRC >1.10*  --   --   --   --   --  >1.10*  --   --   --   --   --   --  >1.10*  CREATININE 2.03*  --  2.21*  --   --  2.04*  --   --   --  2.35*  --   --   --   --   TROPONINIHS 44*  --  29*  --   --   --   --   --   --  40*  --   --  37*  --    < > = values in this interval not displayed.    Estimated Creatinine Clearance: 35.4 mL/min (A) (by C-G formula based on SCr of 2.35 mg/dL (H)).  Assessment: 36 yom presenting with septic shock from ESBL E.Coli bacteremia from cholecystitis. On Eliquis PTA for afib, was ordered, however not given as patient was on BiPAP. Unknown last dose but at least > 24 hours prior to heparin start.  aPTT remained therapeutic  at 82 this morning. Heparin level elevated due to apixaban, as expected. Heparin infusion currently off for IR procedure with plan to resume 2 hrs post-op per IR team. Hg WNL, plt trending down to 126. No bleed issues reported.  Patient s/p percutaneous cholecystostomy drain in IR ~1500. Per IR team request, heparin  to resume 2 hrs post-procedure at 1700  7/23 AM: heparin level >1.1 (due to recent Eliquis) and aPTT 58 seconds (subtherapeutic) on 1300 units/hr. aPTT drawn at 0400 and heparin restarted at 1700. Per RN, no issues with infusion running continuously.  Goal of Therapy:  Heparin level 0.3-0.7 units/ml aPTT 66-102 Monitor platelets by anticoagulation protocol: Yes   Plan:  Increase heparin infusion to 1450 units/hr Check aPTT in 8 hours  Monitor daily aPTT/heparin level until correlating, CBC, s/sx bleeding  Arabella Merles, PharmD. Clinical Pharmacist 02/24/2023 5:24 AM

## 2023-02-24 NOTE — Progress Notes (Signed)
NAME:  Shane Diaz, MRN:  [id number], DOB:  [birth year], LOS: 4 ADMISSION DATE:  02/20/2023, CONSULTATION DATE:  02/24/23 REFERRING MD:  Dalene Seltzer - EDP, CHIEF COMPLAINT: Shock   History of Present Illness:  77 year old man who presented to [facility] ED 7/19 with acutely worsening SOB and cough. PMHx significant for HTN, HLD, Afib (on Eliquis), HFpEF, chronic respiratory failure on home O2 (2-4LNC), OSA, CKD stage 3b, venous insufficiency, hypothyroidism. Recently admitted for acute CHF exacerbation and discharged home on 7/11 improved after diuresis.   History is obtained from chart review and from patient's wife, at bedside. Patient arrived to the ED on CPAP after having a syncopal event the night before presentation. CXR was consistent with bilateral pleural effusions without clear infiltrate. Patient was hypotensive and given 1L bolus with minimal improvement and started on Levophed. Labs significant for LA 2.5, Na 132, K 3.1, Cr near baseline of 1.9, BNP 187, WBC 15. PCCM consulted for admission.   Patient initially progressed well with transfer to [facility] service and plan for transfer out of ICU 7/22 when he went to IR for percutaneous cholecystostomy placement; on return from IR patient was lethargic, cyanotic with SpO2 in 60s-70s and hypotensive with BP 70s/50s, suspect related to procedural sedation. Patient was placed back on BiPAP with improvement and PCCM reconsulted in this setting.  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  Significant Hospital Events: Including procedures, antibiotic start and stop dates in addition to other pertinent events   7/19 Presented to the ED after syncopal event with  increased shortness of breath and hypotension requiring pressors.   7/21 Some issues with pain overnight but well controlled this am. 7/22 IR for percutaneous cholecystostomy >> hypotensive, hypercarbic postprocedure requiring BiPAP and pressors  Interim History / Subjective:   Remains critically ill. On BiPAP On Levophed up to 12 mics and vasopressin 500 cc urine output charted Afebrile Objective   Blood pressure (!) 151/68, pulse 61, temperature 98.1 F (36.7 C), temperature source Oral, resp. rate (!) 22, height 5\' 8"  (1.727 m), weight 135.1 kg, SpO2 98%.      Vent Mode: BIPAP;PCV FiO2 (%):  [40 %-100 %] 40 % Set Rate:  [10 bmp-16 bmp] 10 bmp PEEP:  [8 cmH20] 8 cmH20   Intake/Output Summary (Last 24 hours) at 02/24/2023 1253 Last data filed at 02/24/2023 1200 Gross per 24 hour  Intake 1964.83 ml  Output 775 ml  Net 1189.83 ml   Filed Weights   02/22/23 0500 02/23/23 0440 02/24/23 0434  Weight: 133.4 kg 135.3 kg 135.1 kg   Physical Examination: General: Acutely ill-appearing elderly man in NAD.  HEENT: [place]/AT, anicteric sclera, PERRL, dry mucous membranes. Neuro: Awake, interactive, nonfocal CV: Irregular rhythm, no m/g/r. Rates variable. PULM: No accessory muscle use, decreased breath sounds bilateral GI: Obese, soft, nontender hypoactive bowel sounds.  Bilious drainage in tube Extremities: Trace bilateral symmetric LE edema noted. Skin: Warm/dry, no rashes  Labs show normal electrolytes, BUN/creatinine 50/2.4, increased leukocytosis  Resolved Hospital Problem List   Septic shock   Assessment & Plan:  Septic shock due to ESBL E. coli bacteremia from acute cholecystitis  Elevated LFTs Elevated bilirubin Severe RUQ pain overnight improved with opoids. RUQ sonogram showed stones and sludge in the gallbladder without CBD dilatation. Noncontrast CT Chest/A/P showed  a distended gallbladder filled with multiple tiny stones clustered in the fundus with pericholecystic  inflammatory fat stranding concerning for acute cholecystitis. A short segment of mild wall thickening adjacent to the gallbladder was noted, possibly reflecting inflammation of the colon. Underwent IR cholecystostomy tube placement 7/22 with translocation/shock - GI and CCS following, appreciate recommendations - Continue meropenem -DC vasopressin, titrate down Levophed as blood pressure improves  Acute on chronic hypoxemic respiratory failure likely secondary to heart failure Transient hypoxemia s/p IR procedure Severe OSA  Compensated hypercarbia suggest obesity hypoventilation Patient is on home oxygen 2-4 L daily per daughter at bedside; per patient's wife primarily only uses at night.  He does have CPAP machine at home which he uses - Supplemental O2 support for SpO2 > 90% - BiPAP QHS + PRN - Pulmonary hygiene -Will need formal titration study as outpatient to decide need for BiPAP  HFpEF Echo 7/7 EF 65 to 70% with grade 2 diastolic dysfunction Chronic atrial fibrillation anticoagulated with Eliquis - Cardiac monitoring - Optimize electrolytes for K > 4, Mg > 2 - Heparin gtt for AC, transition back to Eliquis once no further procedures planned - Monitor I&Os - Resumption of GDMT once able to tolerate from a hemodynamic standpoint  Micturition syncope Echo 7/7 EF 65-70%, no RWMAs, mild asymmetric LVH, G2DD. - Cardiac monitoring - EKG wi incomplete RBBB, ?R ventricular hypertrophy with repolarization abnormality, T wave abnormality, prolonged QT (QTc 570) - Optimize electrolytes for K > 4, Mg > 2  Chronic venous stasis skin changes LE Dopplers 7/20 negative for DVT. - Local wound care  Hypothyroidism - Continue Synthroid  AKI on CKD stage 4 Baseline creatinine seems to be around 1.3-1.5. - Trend BMP - Monitor I&Os - Avoid nephrotoxic agents as able   Best Practice: (right click and "Reselect all SmartList Selections" daily)   Diet/type: NPO ok for sips w/ meds DVT  prophylaxis: DOAC GI prophylaxis: N/A Lines: N/A Foley:  N/A Code Status:  full code Last date of multidisciplinary goals of care discussion [ 7/23 updated wife at bedside]  Critical care time:   The patient is critically ill with multiple organ system failure and requires high complexity decision making for assessment and support, frequent evaluation and titration of therapies, advanced monitoring, review of radiographic studies and interpretation of complex data.   Critical Care Time devoted to patient care services, exclusive of separately billable procedures, described in this note is 32 minutes.  Comer Locket Vassie Loll, MD [facility] 02/24/23 12:53 PM  Please see [email or web address] for pager details.  From 7A-7P if no response, please call [phone] After hours, please call ELink [phone]

## 2023-02-24 NOTE — Progress Notes (Signed)
Referring Physician(s): Dr Dwain Sarna  Supervising Physician: Marliss Coots  Patient Status:  [facility] - In-pt  Chief Complaint:  Male with cholecystitis S/P percutaneous cholecystostomy tube 7/22  Subjective:  Up in bed Feeling no pain Not well though- still with some nausea In ICU with Levophed  Allergies: Diltiazem and Amiodarone  Medications: Prior to Admission medications   Medication Sig Start Date End Date Taking? Authorizing Provider  apixaban (ELIQUIS) 5 MG TABS tablet Take 1 tablet (5 mg total) by mouth 2 (two) times daily. 10/23/22  Yes [name], [name], MD  bisacodyl 5 MG EC tablet Take 10 mg by mouth daily as needed for moderate constipation.   Yes [provider]  bismuth subsalicylate (PEPTO BISMOL) 262 MG/15ML suspension Take 30 mLs by mouth as needed for diarrhea or loose stools or indigestion.   Yes [provider]  bumetanide (BUMEX) 2 MG tablet TAKE 2 TABLETS BY MOUTH TWICE A DAY 01/12/23  Yes [name], [name], MD  levothyroxine (SYNTHROID) 100 MCG tablet Take 1 tablet (100 mcg total) by mouth daily at 6 (six) AM. 02/13/23 03/15/23 Yes [name], [name], MD  midodrine (PROAMATINE) 5 MG tablet Take 1 tablet (5 mg total) by mouth 2 (two) times daily with a meal. 02/12/23 03/14/23 Yes [name], [name], MD  nitroGLYCERIN (NITROSTAT) 0.4 MG SL tablet Place 1 tablet (0.4 mg total) under the tongue every 5 (five) minutes x 3 doses as needed for chest pain. 12/04/21  Yes [name], [name], MD     Vital Signs: BP (!) 151/68   Pulse 61   Temp 98.1 F (36.7 C) (Oral)   Resp (!) 22   Ht 5\' 8"  (1.727 m)   Wt 297 lb 13.5 oz (135.1 kg)   SpO2 98%   BMI 45.29 kg/m   Physical Exam Vitals reviewed.  Skin:    General: Skin is warm.     Comments: Site of chole drain is NT; no bleeding Clean and dry Drain OP dark bile ~40 cc in bag Flushes easily  Neurological:     Mental Status: He is alert.     Imaging: DG CHEST PORT 1 VIEW  Result  Date: 02/23/2023 CLINICAL DATA:  Respiratory distress EXAM: PORTABLE CHEST 1 VIEW COMPARISON:  Film from earlier in the same day. FINDINGS: Stable cardiomegaly is noted. Aortic calcifications are seen. Lungs are well aerated bilaterally. Increasing basilar atelectasis is noted when compared with prior exam particularly in the left base. No bony abnormality is noted. IMPRESSION: Increasing basilar atelectasis particularly on the left. Electronically Signed   By: Alcide Clever M.D.   On: 02/23/2023 23:29   DG CHEST PORT 1 VIEW  Result Date: 02/23/2023 CLINICAL DATA:  84696 Acute respiratory distress [id number] EXAM: PORTABLE CHEST - 1 VIEW COMPARISON:  02/20/2023 FINDINGS: No pneumothorax. Some worsening of patchy airspace opacities at the left lung base. Heart size and mediastinal contours are within normal limits. Aortic Atherosclerosis (ICD10-170.0). Probable small left pleural effusion. Visualized bones unremarkable. IMPRESSION: Worsening left basilar airspace disease with small effusion. Electronically Signed   By: Corlis Leak M.D.   On: 02/23/2023 16:53   IR BILIARY DRAIN PLACEMENT WITH CHOLANGIOGRAM  Result Date: 02/23/2023 CLINICAL DATA:  Clinical and imaging evidence of acute cholecystitis EXAM: PERCUTANEOUS CHOLECYSTOSTOMY TUBE PLACEMENT WITH ULTRASOUND AND FLUOROSCOPIC GUIDANCE FLUOROSCOPY: Radiation Exposure Index (as provided by the fluoroscopic device): 8 mGy air Kerma TECHNIQUE: The procedure, risks (including but not limited to bleeding, infection, organ damage ), benefits, and alternatives were explained to  the patient. Questions regarding the procedure were encouraged and answered. The patient understands and consents to the procedure. Survey ultrasound of the abdomen was performed and an appropriate skin entry site was identified. Skin site was marked, prepped with chlorhexidine, and draped in usual sterile fashion, and infiltrated locally with 1% lidocaine. Intravenous Fentanyl and Versed  1.5mg  were administered as conscious sedation during continuous monitoring of the patient's level of consciousness and physiological / cardiorespiratory status by the radiology RN, with a total moderate sedation time of 11 minutes. Under real-time ultrasound guidance, gallbladder was accessed using a transhepatic approach with a 21-gauge needle. Ultrasound image documentation was saved. Bile returned through the hub. Needle was exchanged over a 018 guidewire for transitional dilator which allowed placement of 035 J wire. Over this, a 10.2 French pigtail catheter was advanced and formed centrally in the gallbladder lumen. 50 mL of dark bile were aspirated, sample sent for Gram stain and culture. Small contrast injection confirmed appropriate position. Catheter secured externally with 0 Prolene suture and placed external drain bag. Patient tolerated the procedure well. COMPLICATIONS: COMPLICATIONS none IMPRESSION: 1. Technically successful percutaneous cholecystostomy tube placement with ultrasound and fluoroscopic guidance. Electronically Signed   By: Corlis Leak M.D.   On: 02/23/2023 16:51   VAS [place] LOWER EXTREMITY VENOUS (DVT)  Result Date: 02/21/2023  Lower Venous DVT Study Patient Name:  CHARLI LIBERATORE  Date of Exam:   02/21/2023 Medical Rec #: [id number]      Accession #:    [id number] Date of Birth: May 31, 1946      Patient Gender: M Patient Age:   77 years Exam Location:  [facility] Procedure:      VAS [place] LOWER EXTREMITY VENOUS (DVT) Referring Phys: MURALI RAMASWAMY --------------------------------------------------------------------------------  Indications: Edema.  Risk Factors: HX of LE DVT, chronic venous insufficiency, varicose veins with complications,. Anticoagulation: Eliquis prior to admission & heparin during this admission. Comparison Study: Previous exam on 09/25/21 was negative for DVT. Performing Technologist: Ernestene Mention RVT, RDMS  Examination Guidelines: A complete evaluation includes B-mode  imaging, spectral Doppler, color Doppler, and power Doppler as needed of all accessible portions of each vessel. Bilateral testing is considered an integral part of a complete examination. Limited examinations for reoccurring indications may be performed as noted. The reflux portion of the exam is performed with the patient in reverse Trendelenburg.  +---------+---------------+---------+-----------+----------+-------------------+ RIGHT    CompressibilityPhasicitySpontaneityPropertiesThrombus Aging      +---------+---------------+---------+-----------+----------+-------------------+ CFV      Full           Yes      Yes                  Not well visualized +---------+---------------+---------+-----------+----------+-------------------+ SFJ                                                   Not visualized      +---------+---------------+---------+-----------+----------+-------------------+ FV Prox  Full           Yes      Yes                                      +---------+---------------+---------+-----------+----------+-------------------+ FV Mid   Full           Yes  Yes                                      +---------+---------------+---------+-----------+----------+-------------------+ FV DistalFull           Yes      Yes                                      +---------+---------------+---------+-----------+----------+-------------------+ PFV      Full                                                             +---------+---------------+---------+-----------+----------+-------------------+ POP      Full           Yes      Yes                                      +---------+---------------+---------+-----------+----------+-------------------+ PTV      Full                                         Not well visualized +---------+---------------+---------+-----------+----------+-------------------+ PERO     Full                                          Not well visualized +---------+---------------+---------+-----------+----------+-------------------+ Difficulty visualizing CFV & SFJ due to line placement/bandage  Right Technical Findings: Multiphasic arterial flow noted from groin to ankle.  +---------+---------------+---------+-----------+----------+-------------------+ LEFT     CompressibilityPhasicitySpontaneityPropertiesThrombus Aging      +---------+---------------+---------+-----------+----------+-------------------+ CFV      Full           Yes      Yes                                      +---------+---------------+---------+-----------+----------+-------------------+ SFJ      Full                                                             +---------+---------------+---------+-----------+----------+-------------------+ FV Prox  Full           Yes      Yes                                      +---------+---------------+---------+-----------+----------+-------------------+ FV Mid   Full           Yes      Yes                                      +---------+---------------+---------+-----------+----------+-------------------+  FV DistalFull           Yes      Yes                                      +---------+---------------+---------+-----------+----------+-------------------+ PFV      Full                                                             +---------+---------------+---------+-----------+----------+-------------------+ POP      Partial        Yes      Yes                                      +---------+---------------+---------+-----------+----------+-------------------+ PTV      Full                                                             +---------+---------------+---------+-----------+----------+-------------------+ PERO     Full                                         Not well visualized +---------+---------------+---------+-----------+----------+-------------------+    Left Technical Findings: Multiphasic arterial flow noted from groin to ankle.   Summary: BILATERAL: -No evidence of popliteal cyst, bilaterally. RIGHT: - There is no evidence of deep vein thrombosis in the lower extremity. However, portions of this examination were limited- see technologist comments above.  LEFT: - There is no evidence of deep vein thrombosis in the lower extremity.  - Ultrasound characteristics of enlarged lymph nodes noted in the groin.  *See table(s) above for measurements and observations. Electronically signed by Sherald Hess MD on 02/21/2023 at 4:16:32 PM.    Final    US Abdomen Limited RUQ (LIVER/GB)  Result Date: 02/20/2023 CLINICAL DATA:  Cholecystitis. EXAM: ULTRASOUND ABDOMEN LIMITED RIGHT UPPER QUADRANT COMPARISON:  02/20/2023. FINDINGS: Gallbladder: Stones and sludge are present within the gallbladder. No gallbladder wall thickening by measurement. There is questionable mild pericholecystic edema. No sonographic Murphy sign noted by sonographer. Common bile duct: Diameter: 4.7 mm Liver: No focal lesion identified. The liver has a slightly nodular contour inferiorly suggesting underlying cirrhosis increased parenchymal echogenicity. Portal vein is patent on color Doppler imaging with normal direction of blood flow towards the liver. Other: None. IMPRESSION: 1. Stones and sludge in the gallbladder with no gallbladder wall thickening. There is questionable pericholecystic edema. The sonographer reports no sonographic Murphy sign. Correlate clinically to exclude acute cholecystitis. 2. Hepatic steatosis. Electronically Signed   By: Thornell Sartorius M.D.   On: 02/20/2023 23:42   CT CHEST ABDOMEN PELVIS WO CONTRAST  Result Date: 02/20/2023 CLINICAL DATA:  Shortness of breath. Syncope. Chest pain. Sepsis suspected. EXAM: CT CHEST, ABDOMEN AND PELVIS WITHOUT CONTRAST TECHNIQUE: Multidetector CT imaging of the chest, abdomen and pelvis was performed following the standard protocol  without IV contrast. RADIATION  DOSE REDUCTION: This exam was performed according to the departmental dose-optimization program which includes automated exposure control, adjustment of the mA and/or kV according to patient size and/or use of iterative reconstruction technique. COMPARISON:  CT angio chest from 03/18/2021 FINDINGS: CT CHEST FINDINGS Cardiovascular: Heart size and mediastinal contours are unremarkable. Aortic atherosclerosis and coronary artery calcifications. No pericardial effusion. Mediastinum/Nodes: Thyroid gland, trachea, and esophagus appear normal. No enlarged the mediastinal or axillary lymph nodes. Lungs/Pleura: Lungs are hypoinflated. No pleural effusion. Bilateral subpleural consolidation is identified within both lung bases. Subsegmental atelectasis noted within the lingula. No signs of interstitial edema. No pneumothorax. Musculoskeletal: Spondylosis within the thoracic spine. No acute or suspicious osseous findings. Similar to the previous exam there is bilateral chest wall areas of increased soft tissue. On the right this measures 6.6 x 2.9 by 5.4 cm, image 36/3 and image 34/6. This is not appreciably changed from 07/14/2020. CT ABDOMEN PELVIS FINDINGS Hepatobiliary: No focal liver abnormality identified. Gallbladder appears distended and is filled with multiple tiny stones clustered in the fundus. There is pericholecystic inflammatory fat stranding identified. Gallbladder wall thickness is suboptimally evaluated. No signs of bile duct dilatation. Pancreas: Unremarkable. No pancreatic ductal dilatation or surrounding inflammatory changes. Spleen: Normal in size without focal abnormality. Adrenals/Urinary Tract: Normal adrenal glands. Simple appearing cyst arises off the upper pole of the left kidney measuring 2.2 cm, image 64/3. No follow-up imaging recommended. No nephrolithiasis or signs of obstructive uropathy. Urinary bladder appears normal. Stomach/Bowel: Stomach is nondistended. The  appendix is surgically absent. There is a short segment of mild wall thickening adjacent to the gallbladder which may reflect secondary inflammation of the colon, image 80/3. No pathologic dilatation of the large or small bowel loops. Vascular/Lymphatic: Aortic atherosclerosis. No abdominal adenopathy. Borderline left common iliac lymph node measures 1 cm, image 98/3. Right common iliac node measures 0.9 cm. Reproductive: TURP defect identified within the prostate. Other: Fat containing bilateral inguinal hernias. No ascites or focal fluid collections. No signs of pneumoperitoneum. Musculoskeletal: No acute or significant osseous findings. IMPRESSION: 1. Gallbladder appears distended and is filled with multiple tiny stones clustered in the fundus. There is pericholecystic inflammatory fat stranding identified. Gallbladder wall thickness is suboptimally evaluated. Findings are concerning for acute cholecystitis. Consider further evaluation with right upper quadrant ultrasound. 2. Bilateral subpleural consolidation within both lung bases. This may represent atelectasis or pneumonia. 3. Short segment of mild wall thickening adjacent to the gallbladder may reflect secondary inflammation of the colon. 4. Coronary artery calcifications. 5. Fat containing bilateral inguinal hernias. 6.  Aortic Atherosclerosis (ICD10-I70.0). Electronically Signed   By: Signa Kell M.D.   On: 02/20/2023 18:40   [place] EKG SITE RITE  Result Date: 02/20/2023 If Site Rite image not attached, placement could not be confirmed due to current cardiac rhythm.  DG Chest Port 1 View  Result Date: 02/20/2023 CLINICAL DATA:  Shortness of breath. EXAM: PORTABLE CHEST 1 VIEW COMPARISON:  02/06/2023. FINDINGS: Small bilateral pleural effusions with adjacent left basilar atelectasis. Low lung volumes accentuate the cardiomediastinal silhouette. No pneumothorax. IMPRESSION: Small bilateral pleural effusions with adjacent left basilar atelectasis.  Electronically Signed   By: Orvan Falconer M.D.   On: 02/20/2023 15:13    Labs:  CBC: Recent Labs    02/22/23 0618 02/22/23 1351 02/23/23 0439 02/23/23 1611 02/23/23 2145 02/23/23 2225 02/24/23 0306 02/24/23 0325  WBC 19.1*  --  12.6*  --  15.1*  --   --  26.0*  HGB 14.7   < >  13.7   < > 13.9 15.3 15.3 14.2  HCT 45.6   < > 43.3   < > 43.9 45.0 45.0 43.6  PLT 142*  --  126*  --  113*  --   --  139*   < > = values in this interval not displayed.    COAGS: Recent Labs    02/22/23 1415 02/22/23 1945 02/23/23 0855 02/23/23 1109 02/24/23 0404  INR  --   --   --  1.4*  --   APTT 93* 97* 82*  --  58*    BMP: Recent Labs    02/22/23 0618 02/22/23 1351 02/23/23 0439 02/23/23 1611 02/23/23 2145 02/23/23 2225 02/24/23 0306 02/24/23 0600  NA 133*   < > 133*   < > 132* 138 132* 135  K 4.0   < > 3.6   < > 3.0* 3.2* 6.2* 4.4  CL 90*  --  90*  --  91*  --   --  95*  CO2 29  --  30  --  27  --   --  23  GLUCOSE 87  --  95  --  251*  --   --  148*  BUN 39*  --  44*  --  47*  --   --  50*  CALCIUM 8.6*  --  8.2*  --  8.5*  --   --  8.4*  CREATININE 2.21*  --  2.04*  --  2.35*  --   --  2.37*  GFRNONAA 30*  --  33*  --  28*  --   --  28*   < > = values in this interval not displayed.    LIVER FUNCTION TESTS: Recent Labs    02/07/23 0600 02/20/23 1500 02/21/23 1045 02/22/23 0618  BILITOT 2.0* 2.6* 1.6* 1.1  AST 53* 63* 39 27  ALT 17 32 25 21  ALKPHOS 55 129* 86 90  PROT 6.6 9.6* 6.8 6.2*  ALBUMIN 3.6 4.2 2.7* 2.4*    Drain Location: RUQ Size: Fr size: 10 Fr Date of placement: 02/23/23  Currently to: Drain collection device: gravity 24 hour output:  Output by Drain (mL) 02/22/23 0701 - 02/22/23 1900 02/22/23 1901 - 02/23/23 0700 02/23/23 0701 - 02/23/23 1900 02/23/23 1901 - 02/24/23 0700 02/24/23 0701 - 02/24/23 1234  Biliary Tube Cook slip-coat 10 Fr. RUQ   80 195     Interval imaging/drain manipulation:  none  Current examination: Flushes/aspirates  easily.  Insertion site unremarkable. Suture and stat lock in place. Dressed appropriately.   Plan: Continue TID flushes with 5 cc NS. Record output Q shift. Dressing changes QD or PRN if soiled.  Call IR APP or on call IR MD if difficulty flushing or sudden change in drain output.  Discharge planning: Please contact IR APP or on call IR MD prior to patient d/c to ensure appropriate follow up plans are in place. Typically patient will follow up with IR clinic 6-8 weeks post d/c for repeat imaging/possible drain injection. IR scheduler will contact patient with date/time of appointment. Patient will need to flush drain QD with 5 cc NS, record output QD, dressing changes every 2-3 days or earlier if soiled.   IR will continue to follow - please call with questions or concerns.  Assessment and Plan:  Percutaneous chole drain placed in IR 02/23/23 Likely home with drain Will follow up in IR OP Clinic 8 weeks Will need Rx for flushes at DC--- flush  5-10 cc daily- record output He will hear from IR scheduler for time and date  Electronically Signed: Robet Leu, PA-C 02/24/2023, 12:34 PM   I spent a total of 15 Minutes at the the patient's bedside AND on the patient's hospital floor or unit, greater than 50% of which was counseling/coordinating care for perc chole drain

## 2023-02-24 NOTE — Progress Notes (Signed)
RT noticed small amount of breakdown on bridge of nose from BIPAP mask. RT adjusted tightness of mask, and mepilex bandage applied to skin.

## 2023-02-24 NOTE — Progress Notes (Addendum)
[facility] Progress Note     Subjective: CC:  Resting comfortably On one pressor  Objective: Vital signs in last 24 hours: Temp:  [97.4 F (36.3 C)-98.8 F (37.1 C)] 98.6 F (37 C) (07/23 0727) Pulse Rate:  [54-128] 60 (07/23 0630) Resp:  [14-31] 15 (07/23 0630) BP: (76-127)/(40-80) 127/55 (07/23 0400) SpO2:  [61 %-100 %] 100 % (07/23 0630) Arterial Line BP: (85-138)/(40-68) 138/68 (07/23 0630) FiO2 (%):  [60 %-100 %] 60 % (07/23 0258) Weight:  [135.1 kg] 135.1 kg (07/23 0434) Last BM Date :  (pta)  Intake/Output from previous day: 07/22 0701 - 07/23 0700 In: 1979 [I.V.:744.7; IV Piggyback:1229.4] Out: 775 [Urine:500; Drains:275] Intake/Output this shift: No intake/output data recorded.  PE: Gen:  ill appearing Card:  Regular rate and rhythm, pedal pulses 2+ BL Pulm:  BiPAP Abd: Soft, interval placement of perc chole tube with small amt bilious effluent Skin: warm and dry, no rashes    Lab Results:  Recent Labs    02/23/23 2145 02/23/23 2225 02/24/23 0306 02/24/23 0325  WBC 15.1*  --   --  26.0*  HGB 13.9   < > 15.3 14.2  HCT 43.9   < > 45.0 43.6  PLT 113*  --   --  139*   < > = values in this interval not displayed.   BMET Recent Labs    02/23/23 2145 02/23/23 2225 02/24/23 0306 02/24/23 0600  NA 132*   < > 132* 135  K 3.0*   < > 6.2* 4.4  CL 91*  --   --  95*  CO2 27  --   --  23  GLUCOSE 251*  --   --  148*  BUN 47*  --   --  50*  CREATININE 2.35*  --   --  2.37*  CALCIUM 8.5*  --   --  8.4*   < > = values in this interval not displayed.   PT/INR Recent Labs    02/23/23 1109  LABPROT 17.2*  INR 1.4*   CMP     Component Value Date/Time   NA 135 02/24/2023 0600   NA 141 07/02/2022 0951   K 4.4 02/24/2023 0600   CL 95 (L) 02/24/2023 0600   CO2 23 02/24/2023 0600   GLUCOSE 148 (H) 02/24/2023 0600   BUN 50 (H) 02/24/2023 0600   BUN 19 07/02/2022 0951   CREATININE 2.37 (H) 02/24/2023 0600   CALCIUM 8.4 (L) 02/24/2023  0600   PROT 6.2 (L) 02/22/2023 0618   PROT 7.3 07/02/2022 0951   ALBUMIN 2.4 (L) 02/22/2023 0618   ALBUMIN 4.4 07/02/2022 0951   AST 27 02/22/2023 0618   ALT 21 02/22/2023 0618   ALKPHOS 90 02/22/2023 0618   BILITOT 1.1 02/22/2023 0618   BILITOT 0.7 07/02/2022 0951   GFRNONAA 28 (L) 02/24/2023 0600   GFRAA 79 08/23/2020 0925   Lipase     Component Value Date/Time   LIPASE 24 02/22/2023 0618       Studies/Results: DG CHEST PORT 1 VIEW  Result Date: 02/23/2023 CLINICAL DATA:  Respiratory distress EXAM: PORTABLE CHEST 1 VIEW COMPARISON:  Film from earlier in the same day. FINDINGS: Stable cardiomegaly is noted. Aortic calcifications are seen. Lungs are well aerated bilaterally. Increasing basilar atelectasis is noted when compared with prior exam particularly in the left base. No bony abnormality is noted. IMPRESSION: Increasing basilar atelectasis particularly on the left. Electronically Signed   By: Eulah Pont.D.  On: 02/23/2023 23:29   DG CHEST PORT 1 VIEW  Result Date: 02/23/2023 CLINICAL DATA:  62130 Acute respiratory distress 66502 EXAM: PORTABLE CHEST - 1 VIEW COMPARISON:  02/20/2023 FINDINGS: No pneumothorax. Some worsening of patchy airspace opacities at the left lung base. Heart size and mediastinal contours are within normal limits. Aortic Atherosclerosis (ICD10-170.0). Probable small left pleural effusion. Visualized bones unremarkable. IMPRESSION: Worsening left basilar airspace disease with small effusion. Electronically Signed   By: Corlis Leak M.D.   On: 02/23/2023 16:53   IR BILIARY DRAIN PLACEMENT WITH CHOLANGIOGRAM  Result Date: 02/23/2023 CLINICAL DATA:  Clinical and imaging evidence of acute cholecystitis EXAM: PERCUTANEOUS CHOLECYSTOSTOMY TUBE PLACEMENT WITH ULTRASOUND AND FLUOROSCOPIC GUIDANCE FLUOROSCOPY: Radiation Exposure Index (as provided by the fluoroscopic device): 8 mGy air Kerma TECHNIQUE: The procedure, risks (including but not limited to bleeding,  infection, organ damage ), benefits, and alternatives were explained to the patient. Questions regarding the procedure were encouraged and answered. The patient understands and consents to the procedure. Survey ultrasound of the abdomen was performed and an appropriate skin entry site was identified. Skin site was marked, prepped with chlorhexidine, and draped in usual sterile fashion, and infiltrated locally with 1% lidocaine. Intravenous Fentanyl and Versed 1.5mg  were administered as conscious sedation during continuous monitoring of the patient's level of consciousness and physiological / cardiorespiratory status by the radiology RN, with a total moderate sedation time of 11 minutes. Under real-time ultrasound guidance, gallbladder was accessed using a transhepatic approach with a 21-gauge needle. Ultrasound image documentation was saved. Bile returned through the hub. Needle was exchanged over a 018 guidewire for transitional dilator which allowed placement of 035 J wire. Over this, a 10.2 French pigtail catheter was advanced and formed centrally in the gallbladder lumen. 50 mL of dark bile were aspirated, sample sent for Gram stain and culture. Small contrast injection confirmed appropriate position. Catheter secured externally with 0 Prolene suture and placed external drain bag. Patient tolerated the procedure well. COMPLICATIONS: COMPLICATIONS none IMPRESSION: 1. Technically successful percutaneous cholecystostomy tube placement with ultrasound and fluoroscopic guidance. Electronically Signed   By: Corlis Leak M.D.   On: 02/23/2023 16:51    Anti-infectives: Anti-infectives (From admission, onward)    Start     Dose/Rate Route Frequency Ordered Stop   02/23/23 1430  cefOXitin (MEFOXIN) 2 g in sodium chloride 0.9 % 100 mL IVPB        over 30 Minutes  Continuous PRN 02/23/23 1430 02/23/23 1430   02/21/23 1700  vancomycin (VANCOCIN) 2,250 mg in sodium chloride 0.9 % 500 mL IVPB  Status:  Discontinued         2,250 mg 261.3 mL/hr over 120 Minutes Intravenous Every 24 hours 02/20/23 2048 02/21/23 1122   02/21/23 1000  meropenem (MERREM) 1 g in sodium chloride 0.9 % 100 mL IVPB        1 g 200 mL/hr over 30 Minutes Intravenous Every 12 hours 02/21/23 0407     02/21/23 0100  piperacillin-tazobactam (ZOSYN) IVPB 3.375 g  Status:  Discontinued        3.375 g 12.5 mL/hr over 240 Minutes Intravenous Every 8 hours 02/20/23 2048 02/21/23 0407   02/20/23 1800  vancomycin (VANCOCIN) IVPB 1000 mg/200 mL premix       Placed in "Followed by" Linked Group   1,000 mg 200 mL/hr over 60 Minutes Intravenous  Once 02/20/23 1623 02/21/23 0028   02/20/23 1630  vancomycin (VANCOREADY) IVPB 1500 mg/300 mL  Placed in "Followed by" Linked Group   1,500 mg 150 mL/hr over 120 Minutes Intravenous  Once 02/20/23 1623 02/20/23 2300   02/20/23 1630  ceFEPIme (MAXIPIME) 2 g in sodium chloride 0.9 % 100 mL IVPB        2 g 200 mL/hr over 30 Minutes Intravenous  Once 02/20/23 1623 02/20/23 1728   02/20/23 1530  metroNIDAZOLE (FLAGYL) IVPB 500 mg        500 mg 100 mL/hr over 60 Minutes Intravenous  Once 02/20/23 1528 02/20/23 1705        Assessment/Plan  77 year old male with cholecystitis S/P percutaneous cholecystostomy tube 7/22 - GS with abundant GNR Continue IV abx CCS will follow  Septic shock E.Coli bacteremia History of A-fib Hypertension OSA DVTs CAD Chronic heart failure  AKI on CKD   LOS: 4 days   I reviewed nursing notes, hospitalist notes, last 24 h vitals and pain scores, last 48 h intake and output, last 24 h labs and trends, and last 24 h imaging results.  This care required straight forward level of medical decision making.   Hosie Spangle, PA-C [facility] Please see Amion for pager number during day hours 7:00am-4:30pm

## 2023-02-24 NOTE — Progress Notes (Signed)
Patient taken off Bipap and placed on [place] 4L. No increased WOB at this time. Patient states his breathing feels fine and is asking for water. RN at bedside will do oral care. Patient does have breakdown on the bridge of his nose.

## 2023-02-25 DIAGNOSIS — R6521 Severe sepsis with septic shock: Secondary | ICD-10-CM | POA: Diagnosis not present

## 2023-02-25 DIAGNOSIS — A419 Sepsis, unspecified organism: Secondary | ICD-10-CM | POA: Diagnosis not present

## 2023-02-25 LAB — GLUCOSE, CAPILLARY
Glucose-Capillary: 87 mg/dL (ref 70–99)
Glucose-Capillary: 79 mg/dL (ref 70–99)
Glucose-Capillary: 89 mg/dL (ref 70–99)
Glucose-Capillary: 103 mg/dL — ABNORMAL HIGH (ref 70–99)
Glucose-Capillary: 119 mg/dL — ABNORMAL HIGH (ref 70–99)
Glucose-Capillary: 100 mg/dL — ABNORMAL HIGH (ref 70–99)

## 2023-02-25 LAB — CULTURE, BLOOD (ROUTINE X 2)
Culture: NO GROWTH
Special Requests: ADEQUATE

## 2023-02-25 LAB — BASIC METABOLIC PANEL
Anion gap: 10 (ref 5–15)
BUN: 57 mg/dL — ABNORMAL HIGH (ref 8–23)
CO2: 30 mmol/L (ref 22–32)
Calcium: 8.6 mg/dL — ABNORMAL LOW (ref 8.9–10.3)
Chloride: 93 mmol/L — ABNORMAL LOW (ref 98–111)
Creatinine, Ser: 2.32 mg/dL — ABNORMAL HIGH (ref 0.61–1.24)
GFR, Estimated: 28 mL/min — ABNORMAL LOW (ref >60)
Glucose, Bld: 90 mg/dL (ref 70–99)
Potassium: 3.8 mmol/L (ref 3.5–5.1)
Sodium: 133 mmol/L — ABNORMAL LOW (ref 135–145)

## 2023-02-25 LAB — PHOSPHORUS: Phosphorus: 2.8 mg/dL (ref 2.5–4.6)

## 2023-02-25 LAB — MAGNESIUM: Magnesium: 2 mg/dL (ref 1.7–2.4)

## 2023-02-25 LAB — HEMOGLOBIN A1C
Hgb A1c MFr Bld: 6.2 % — ABNORMAL HIGH (ref 4.8–5.6)
Mean Plasma Glucose: 131.24 mg/dL

## 2023-02-25 LAB — APTT
aPTT: 62 seconds — ABNORMAL HIGH (ref 24–36)
aPTT: 68 seconds — ABNORMAL HIGH (ref 24–36)

## 2023-02-25 LAB — HEPARIN LEVEL (UNFRACTIONATED): Heparin Unfractionated: >1.1 IU/mL — ABNORMAL HIGH (ref 0.30–0.70)

## 2023-02-25 LAB — AEROBIC/ANAEROBIC CULTURE W GRAM STAIN (SURGICAL/DEEP WOUND)

## 2023-02-25 MED ORDER — INSULIN ASPART 100 UNIT/ML IJ SOLN: 0.0000 [IU] | Freq: Every day | INTRAMUSCULAR | Status: DC

## 2023-02-25 MED ORDER — INSULIN ASPART 100 UNIT/ML IJ SOLN
0.0000 [IU] | Freq: Three times a day (TID) | INTRAMUSCULAR | Status: DC
  Administered 2023-02-26 – 2023-02-27 (×2): 2 [IU] via SUBCUTANEOUS

## 2023-02-25 MED ORDER — HEPARIN (PORCINE) 25000 UT/250ML-% IV SOLN
1600.0000 [IU]/h | INTRAVENOUS | Status: DC
  Administered 2023-02-25: 1600 [IU]/h via INTRAVENOUS

## 2023-02-25 MED ORDER — APIXABAN 5 MG PO TABS
5.0000 mg | ORAL_TABLET | Freq: Two times a day (BID) | ORAL | Status: DC
  Administered 2023-02-25 – 2023-02-28 (×7): 5 mg via ORAL
  Filled 2023-02-25 (×7): qty 1

## 2023-02-25 NOTE — Progress Notes (Signed)
ANTICOAGULATION CONSULT NOTE - Follow Up Consult  Pharmacy Consult for Eliquis > Heparin  Indication: atrial fibrillation  Allergies  Allergen Reactions   Diltiazem     rash   Amiodarone Rash    Amiodarone DC'd 02/07/2023 due to concern causing hypothyroidism.     Patient Measurements: Height: 5\' 8"  (172.7 cm) Weight: 135.1 kg (297 lb 13.5 oz) IBW/kg (Calculated) : 68.4 Heparin Dosing Weight: 98.5kg   Vital Signs: Temp: 98.1 F (36.7 C) (07/23 2327) Temp Source: Oral (07/23 2327) BP: 81/21 (07/23 1500) Pulse Rate: 65 (07/23 2300)  Labs: Recent Labs    02/22/23 0618 02/22/23 1351 02/23/23 0439 02/23/23 0855 02/23/23 1109 02/23/23 1611 02/23/23 2145 02/23/23 2225 02/24/23 0306 02/24/23 0325 02/24/23 0404 02/24/23 0600 02/24/23 1545 02/24/23 2330  HGB 14.7   < > 13.7  --   --    < > 13.9 15.3 15.3 14.2  --   --   --   --   HCT 45.6   < > 43.3  --   --    < > 43.9 45.0 45.0 43.6  --   --   --   --   PLT 142*  --  126*  --   --   --  113*  --   --  139*  --   --   --   --   APTT  --    < >  --  82*  --   --   --   --   --   --  58*  --  68* 62*  LABPROT  --   --   --   --  17.2*  --   --   --   --   --   --   --   --   --   INR  --   --   --   --  1.4*  --   --   --   --   --   --   --   --   --   HEPARINUNFRC  --   --   --  >1.10*  --   --   --   --   --   --  >1.10*  --   --   --   CREATININE 2.21*  --  2.04*  --   --   --  2.35*  --   --   --   --  2.37*  --   --   TROPONINIHS 29*  --   --   --   --   --  40*  --   --  37*  --   --   --   --    < > = values in this interval not displayed.    Estimated Creatinine Clearance: 35.1 mL/min (A) (by C-G formula based on SCr of 2.37 mg/dL (H)).  Assessment: 46 yom presenting with septic shock from ESBL E.Coli bacteremia from cholecystitis. On Eliquis PTA for afib, was ordered, however not given as patient was on BiPAP. Unknown last dose but at least > 24 hours prior to heparin start.  aPTT remained therapeutic at 82  this morning. Heparin level elevated due to apixaban, as expected. Heparin infusion currently off for IR procedure with plan to resume 2 hrs post-op per IR team. Hg WNL, plt trending down to 126. No bleed issues reported.  Patient s/p percutaneous cholecystostomy drain in IR ~1500. Per IR team  request, heparin to resume 2 hrs post-procedure at 1700  7/23: aPTT 68 seconds (therapeutic) on 1450 units/hr. No issues with infusion or bleeding problems reported.  Goal of Therapy:  Heparin level 0.3-0.7 units/ml aPTT 66-102 Monitor platelets by anticoagulation protocol: Yes   Plan:  Continue heparin infusion at 1450 units/hr Check aPTT in 8 hours to confirm therapeutic Monitor daily aPTT/heparin level until correlating, CBC, s/sx bleeding  Loralee Pacas, PharmD, BCPS 02/25/2023 12:10 AM   Please check AMION for all [facility] phone numbers After 10:00 PM, call Main Pharmacy [phone]  Addendum: aPTT decreased 62 sec, no issues with infusion per d/w RN. Will increase rate to 1600 units/hr and recheck level in 8hrs.   Loralee Pacas, PharmD, BCPS 02/25/2023 12:13 AM

## 2023-02-25 NOTE — Progress Notes (Signed)
PROGRESS NOTE    Shane Diaz  ZOX:[id number] DOB: 11-01-1945 DOA: 02/20/2023 PCP: Ralene Ok, MD    Brief Narrative:  77 year old gentleman with chronic diastolic heart failure, hypertension, CKD stage IIIb, atrial fibrillation on apixaban, chronic venous insufficiency, chronic respiratory failure on home oxygen, sleep apnea on CPAP presented to the ER on 7/19 with acutely worsening shortness of breath and cough.  He was recently admitted with heart failure exacerbation and was discharged home on 7/11 after diuresis.  He arrived to the emergency room on CPAP reported syncopal event at home the night prior.  Patient also reported chest pain, back pain, abdominal pain and confusion.  In the emergency room initially hypotensive, he was given 1 L bolus with minimal improvement, he was started on peripheral Levophed and admitted to ICU.  He was found to have E. coli bacteremia due to acute cholecystitis. 7/22, transfer out of ICU, respiratory decompensation while undergoing percutaneous cholecystostomy and back to ICU. 7/22, percutaneous cholecystostomy by IR. 7/24, blood pressures are adequate.  Transferring out to progressive care unit.   Assessment & Plan:   Septic shock secondary to E. coli bacteremia, acute cholecystitis. Weaned off vasopressors.  Sepsis physiology improved.  Blood pressures are adequate. Blood cultures 7/19, ESBL E. coli and currently on meropenem Blood culture 7/22, negative so far. 7/22, IR did subcutaneous cholecystostomy.  Surgical cultures with abundant E. coli. Surgery following.  Planning to manage conservatively now. On clear liquid diet.  Will advance to full liquid diet.  Stable to come out of ICU.  Acute on chronic respiratory failure with hypoxemia and hypercapnia: Significant component of sleep apnea and obesity hypoventilation syndrome. Treated with BiPAP and now on CPAP. Continue BiPAP as needed and at nights, keep on oxygen to keep saturation more than  90%. He is net positive, however renal functions do not allow use for dialysis.  Will monitor closely.  Chronic atrial fibrillation: Currently on heparin.  Do not anticipate further procedures.  Start Eliquis.  Off beta-blockers due to [facility] phenomenon noted.  Rate is controlled now.  Chronic venous stasis: Extremity Doppler negative for DVT.  Local wound care.  Hypothyroidism: On Synthroid.  Continue.  Acute kidney injury on chronic kidney disease stage IV: Recent known creatinine 1.7-1.8. Creatinine gradually plateauing.  2.32 today.  Continue intake output monitoring.  Holding GDMT and diuretics.  Stable to transfer to progressive bed.  Discontinue heparin and start Eliquis.  Discontinue A-line.   DVT prophylaxis: SCDs Start: 02/20/23 1715 apixaban (ELIQUIS) tablet 5 mg   Code Status: Full code Family Communication: None, will update Disposition Plan: Status is: Inpatient Remains inpatient appropriate because: Severe systemic disease     Consultants:  General surgery Gastroenterology Critical care Interventional radiology  Procedures:  Percutaneous cholecystostomy 7/22  Antimicrobials:  Meropenem 7/20----   Subjective: Patient seen in the morning rounds.  Denies any complaints.  Denies any chest pain or shortness of breath.  He was looking quite comfortable.  Patient tells me that he has minimal discomfort on his drain site.  Denies any nausea vomiting.  Tolerating liquids well.  Objective: Vitals:   02/25/23 0812 02/25/23 0900 02/25/23 1000 02/25/23 1119  BP: 107/64 112/63 113/79   Pulse: 65 62 62   Resp: 19 (!) 24 (!) 24   Temp:    98.2 F (36.8 C)  TempSrc:    Oral  SpO2: 100% 100% 100%   Weight:      Height:        Intake/Output Summary (Last  24 hours) at 02/25/2023 1147 Last data filed at 02/25/2023 1024 Gross per 24 hour  Intake 1356.26 ml  Output 210 ml  Net 1146.26 ml   Filed Weights   02/23/23 0440 02/24/23 0434 02/25/23 0354  Weight:  135.3 kg 135.1 kg 134.5 kg    Examination:  General: Sick looking but not in any distress.  Frail and debilitated. Cardiovascular: S1-S2 normal.  Irregularly irregular. Respiratory: Poor inspiratory effort.  Poor air entry at bases.  On 2 L oxygen. Gastrointestinal: Soft.  Distended but nontender.  Bowel sounds present.  Bilious drainage in the percutaneous tube. Ext: Chronic stasis changes and discoloration of the skin of both legs.  2+ edema. Neuro: Alert and awake.  Flat affect.  Moves all extremities.  Data Reviewed: I have personally reviewed following labs and imaging studies  CBC: Recent Labs  Lab 02/20/23 1500 02/20/23 1529 02/21/23 0341 02/22/23 0618 02/22/23 1351 02/23/23 0439 02/23/23 1611 02/23/23 2145 02/23/23 2225 02/24/23 0306 02/24/23 0325  WBC 15.5*  --  35.0* 19.1*  --  12.6*  --  15.1*  --   --  26.0*  NEUTROABS 13.9*  --   --   --   --   --   --   --   --   --   --   HGB 19.2*   < > 16.8 14.7   < > 13.7 17.0 13.9 15.3 15.3 14.2  HCT 59.3*   < > 51.2 45.6   < > 43.3 50.0 43.9 45.0 45.0 43.6  MCV 101.9*  --  101.0* 102.5*  --  103.8*  --  103.1*  --   --  101.6*  PLT 230  --  209 142*  --  126*  --  113*  --   --  139*   < > = values in this interval not displayed.   Basic Metabolic Panel: Recent Labs  Lab 02/22/23 0618 02/22/23 1351 02/23/23 0439 02/23/23 1611 02/23/23 2145 02/23/23 2225 02/24/23 0306 02/24/23 0600 02/25/23 0217  NA 133*   < > 133*   < > 132* 138 132* 135 133*  K 4.0   < > 3.6   < > 3.0* 3.2* 6.2* 4.4 3.8  CL 90*  --  90*  --  91*  --   --  95* 93*  CO2 29  --  30  --  27  --   --  23 30  GLUCOSE 87  --  95  --  251*  --   --  148* 90  BUN 39*  --  44*  --  47*  --   --  50* 57*  CREATININE 2.21*  --  2.04*  --  2.35*  --   --  2.37* 2.32*  CALCIUM 8.6*  --  8.2*  --  8.5*  --   --  8.4* 8.6*  MG 2.4  --  2.2  --  1.9  --   --  2.1 2.0  PHOS 4.2  --  4.0  --  2.5  --   --  3.3 2.8   < > = values in this interval not  displayed.   GFR: Estimated Creatinine Clearance: 35.8 mL/min (A) (by C-G formula based on SCr of 2.32 mg/dL (H)). Liver Function Tests: Recent Labs  Lab 02/20/23 1500 02/21/23 1045 02/22/23 0618  AST 63* 39 27  ALT 32 25 21  ALKPHOS 129* 86 90  BILITOT 2.6* 1.6* 1.1  PROT 9.6*  6.8 6.2*  ALBUMIN 4.2 2.7* 2.4*   Recent Labs  Lab 02/22/23 0618  LIPASE 24  AMYLASE 27*   Recent Labs  Lab 02/24/23 0612  AMMONIA 24   Coagulation Profile: Recent Labs  Lab 02/23/23 1109  INR 1.4*   Cardiac Enzymes: No results for input(s): "CKTOTAL", "CKMB", "CKMBINDEX", "TROPONINI" in the last 168 hours. BNP (last 3 results) No results for input(s): "PROBNP" in the last 8760 hours. HbA1C: Recent Labs    02/25/23 0541  HGBA1C 6.2*   CBG: Recent Labs  Lab 02/24/23 1943 02/24/23 2325 02/25/23 0316 02/25/23 0723 02/25/23 1118  GLUCAP 109* 101* 87 79 89   Lipid Profile: No results for input(s): "CHOL", "HDL", "LDLCALC", "TRIG", "CHOLHDL", "LDLDIRECT" in the last 72 hours. Thyroid Function Tests: No results for input(s): "TSH", "T4TOTAL", "FREET4", "T3FREE", "THYROIDAB" in the last 72 hours. Anemia Panel: No results for input(s): "VITAMINB12", "FOLATE", "FERRITIN", "TIBC", "IRON", "RETICCTPCT" in the last 72 hours. Sepsis Labs: Recent Labs  Lab 02/20/23 1500 02/20/23 1553 02/20/23 2008 02/22/23 0618  PROCALCITON 2.19  --   --   --   LATICACIDVEN  --  2.5* 1.6 0.8    Recent Results (from the past 240 hour(s))  SARS Coronavirus 2 by RT PCR (hospital order, performed in [facility] hospital lab) *cepheid single result test*     Status: None   Collection Time: 02/20/23  3:30 PM   Specimen: Nasal Swab  Result Value Ref Range Status   SARS Coronavirus 2 by RT PCR NEGATIVE NEGATIVE Final    Comment: Performed at [facility], [address], [place], [place] [address]  Blood culture (routine x 2)     Status: Abnormal   Collection Time: 02/20/23  3:53 PM   Specimen:  BLOOD  Result Value Ref Range Status   Specimen Description BLOOD SITE NOT SPECIFIED  Final   Special Requests   Final    BOTTLES DRAWN AEROBIC AND ANAEROBIC Blood Culture adequate volume   Culture  Setup Time   Final    GRAM NEGATIVE RODS IN BOTH AEROBIC AND ANAEROBIC BOTTLES CRITICAL RESULT CALLED TO, READ BACK BY AND VERIFIED WITH: J [facility]  02/21/23 MK Performed at [facility], [address] [place], [place] [address]    Culture (A)  Final    ESCHERICHIA COLI Confirmed Extended Spectrum Beta-Lactamase Producer (ESBL).  In bloodstream infections from ESBL organisms, carbapenems are preferred over piperacillin/tazobactam. They are shown to have a lower risk of mortality.    Report Status 02/25/2023 FINAL  Final   Organism ID, Bacteria ESCHERICHIA COLI  Final      Susceptibility   Escherichia coli - MIC*    AMPICILLIN >=32 RESISTANT Resistant     CEFEPIME 1 SENSITIVE Sensitive     CEFTAZIDIME RESISTANT Resistant     CEFTRIAXONE 16 RESISTANT Resistant     CIPROFLOXACIN RESISTANT Resistant     GENTAMICIN <=1 SENSITIVE Sensitive     IMIPENEM <=0.25 SENSITIVE Sensitive     TRIMETH/SULFA <=20 SENSITIVE Sensitive     AMPICILLIN/SULBACTAM >=32 RESISTANT Resistant     PIP/TAZO 16 SENSITIVE Sensitive     * ESCHERICHIA COLI  Blood Culture ID Panel (Reflexed)     Status: Abnormal   Collection Time: 02/20/23  3:53 PM  Result Value Ref Range Status   Enterococcus faecalis NOT DETECTED NOT DETECTED Final   Enterococcus Faecium NOT DETECTED NOT DETECTED Final   Listeria monocytogenes NOT DETECTED NOT DETECTED Final   Staphylococcus species NOT  DETECTED NOT DETECTED Final   Staphylococcus aureus (BCID) NOT DETECTED NOT DETECTED Final   Staphylococcus epidermidis NOT DETECTED NOT DETECTED Final   Staphylococcus lugdunensis NOT DETECTED NOT DETECTED Final   Streptococcus species NOT DETECTED NOT DETECTED Final   Streptococcus agalactiae NOT DETECTED NOT DETECTED Final    Streptococcus pneumoniae NOT DETECTED NOT DETECTED Final   Streptococcus pyogenes NOT DETECTED NOT DETECTED Final   A.calcoaceticus-baumannii NOT DETECTED NOT DETECTED Final   Bacteroides fragilis NOT DETECTED NOT DETECTED Final   Enterobacterales DETECTED (A) NOT DETECTED Final    Comment: Enterobacterales represent a large order of gram negative bacteria, not a single organism. CRITICAL RESULT CALLED TO, READ BACK BY AND VERIFIED WITH: J WYLAND,PHARMD@0350  02/21/23 MK    Enterobacter cloacae complex NOT DETECTED NOT DETECTED Final   Escherichia coli DETECTED (A) NOT DETECTED Final    Comment: CRITICAL RESULT CALLED TO, READ BACK BY AND VERIFIED WITH: J WYLAND,PHARMD@0350  02/21/23 MK    Klebsiella aerogenes NOT DETECTED NOT DETECTED Final   Klebsiella oxytoca NOT DETECTED NOT DETECTED Final   Klebsiella pneumoniae NOT DETECTED NOT DETECTED Final   Proteus species NOT DETECTED NOT DETECTED Final   Salmonella species NOT DETECTED NOT DETECTED Final   Serratia marcescens NOT DETECTED NOT DETECTED Final   Haemophilus influenzae NOT DETECTED NOT DETECTED Final   Neisseria meningitidis NOT DETECTED NOT DETECTED Final   Pseudomonas aeruginosa NOT DETECTED NOT DETECTED Final   Stenotrophomonas maltophilia NOT DETECTED NOT DETECTED Final   Candida albicans NOT DETECTED NOT DETECTED Final   Candida auris NOT DETECTED NOT DETECTED Final   Candida glabrata NOT DETECTED NOT DETECTED Final   Candida krusei NOT DETECTED NOT DETECTED Final   Candida parapsilosis NOT DETECTED NOT DETECTED Final   Candida tropicalis NOT DETECTED NOT DETECTED Final   Cryptococcus neoformans/gattii NOT DETECTED NOT DETECTED Final   CTX-M ESBL DETECTED (A) NOT DETECTED Final    Comment: CRITICAL RESULT CALLED TO, READ BACK BY AND VERIFIED WITH: J WYLAND,PHARMD@0350  02/21/23 MK (NOTE) Extended spectrum beta-lactamase detected. Recommend a carbapenem as initial therapy.      Carbapenem resistance IMP NOT DETECTED  NOT DETECTED Final   Carbapenem resistance KPC NOT DETECTED NOT DETECTED Final   Carbapenem resistance NDM NOT DETECTED NOT DETECTED Final   Carbapenem resist OXA 48 LIKE NOT DETECTED NOT DETECTED Final   Carbapenem resistance VIM NOT DETECTED NOT DETECTED Final    Comment: Performed at [facility], [address], [place], [place] [address]  Blood culture (routine x 2)     Status: Abnormal   Collection Time: 02/20/23  4:02 PM   Specimen: BLOOD  Result Value Ref Range Status   Specimen Description BLOOD SITE NOT SPECIFIED  Final   Special Requests   Final    BOTTLES DRAWN AEROBIC AND ANAEROBIC Blood Culture adequate volume   Culture  Setup Time   Final    GRAM NEGATIVE RODS IN BOTH AEROBIC AND ANAEROBIC BOTTLES CRITICAL VALUE NOTED.  VALUE IS CONSISTENT WITH PREVIOUSLY REPORTED AND CALLED VALUE.    Culture (A)  Final    ESCHERICHIA COLI SUSCEPTIBILITIES PERFORMED ON PREVIOUS CULTURE WITHIN THE LAST 5 DAYS. Performed at [facility], [address] [place], [place] [address]    Report Status 02/25/2023 FINAL  Final  MRSA Next Gen by PCR, Nasal     Status: None   Collection Time: 02/21/23  7:15 AM   Specimen: Nasal Mucosa; Nasal Swab  Result Value Ref Range Status  MRSA by PCR Next Gen NOT DETECTED NOT DETECTED Final    Comment: (NOTE) The GeneXpert MRSA Assay (FDA approved for NASAL specimens only), is one component of a comprehensive MRSA colonization surveillance program. It is not intended to diagnose MRSA infection nor to guide or monitor treatment for MRSA infections. Test performance is not FDA approved in patients less than 31 years old. Performed at [facility], [address], [place], [place] [address]   Culture, blood (Routine X 2) w Reflex to ID Panel     Status: None (Preliminary result)   Collection Time: 02/23/23  8:55 AM   Specimen: BLOOD RIGHT ARM  Result Value Ref Range Status   Specimen Description BLOOD RIGHT ARM  Final   Special  Requests   Final    BOTTLES DRAWN AEROBIC AND ANAEROBIC Blood Culture results may not be optimal due to an inadequate volume of blood received in culture bottles   Culture   Final    NO GROWTH 2 DAYS Performed at [facility], [address] [place], [place] [address]    Report Status PENDING  Incomplete  Culture, blood (Routine X 2) w Reflex to ID Panel     Status: None (Preliminary result)   Collection Time: 02/23/23  8:55 AM   Specimen: BLOOD LEFT HAND  Result Value Ref Range Status   Specimen Description BLOOD LEFT HAND  Final   Special Requests   Final    BOTTLES DRAWN AEROBIC AND ANAEROBIC Blood Culture results may not be optimal due to an inadequate volume of blood received in culture bottles   Culture   Final    NO GROWTH 2 DAYS Performed at [facility], [address] [place], [place] [address]    Report Status PENDING  Incomplete  Aerobic/Anaerobic Culture w Gram Stain (surgical/deep wound)     Status: None (Preliminary result)   Collection Time: 02/23/23  2:58 PM   Specimen: BILE  Result Value Ref Range Status   Specimen Description BILE  Final   Special Requests NONE  Final   Gram Stain NO WBC SEEN ABUNDANT GRAM NEGATIVE RODS   Final   Culture   Final    ABUNDANT ESCHERICHIA COLI NO ANAEROBES ISOLATED; CULTURE IN PROGRESS FOR 5 DAYS SUSCEPTIBILITIES TO FOLLOW Performed at [facility], [address], [place], [place] [address]    Report Status PENDING  Incomplete         Radiology Studies: DG CHEST PORT 1 VIEW  Result Date: 02/23/2023 CLINICAL DATA:  Respiratory distress EXAM: PORTABLE CHEST 1 VIEW COMPARISON:  Film from earlier in the same day. FINDINGS: Stable cardiomegaly is noted. Aortic calcifications are seen. Lungs are well aerated bilaterally. Increasing basilar atelectasis is noted when compared with prior exam particularly in the left base. No bony abnormality is noted. IMPRESSION: Increasing basilar atelectasis particularly on  the left. Electronically Signed   By: Alcide Clever M.D.   On: 02/23/2023 23:29   DG CHEST PORT 1 VIEW  Result Date: 02/23/2023 CLINICAL DATA:  63016 Acute respiratory distress [address]502 EXAM: PORTABLE CHEST - 1 VIEW COMPARISON:  02/20/2023 FINDINGS: No pneumothorax. Some worsening of patchy airspace opacities at the left lung base. Heart size and mediastinal contours are within normal limits. Aortic Atherosclerosis (ICD10-170.0). Probable small left pleural effusion. Visualized bones unremarkable. IMPRESSION: Worsening left basilar airspace disease with small effusion. Electronically Signed   By: Corlis Leak M.D.   On: 02/23/2023 16:53   IR  BILIARY DRAIN PLACEMENT WITH CHOLANGIOGRAM  Result Date: 02/23/2023 CLINICAL DATA:  Clinical and imaging evidence of acute cholecystitis EXAM: PERCUTANEOUS CHOLECYSTOSTOMY TUBE PLACEMENT WITH ULTRASOUND AND FLUOROSCOPIC GUIDANCE FLUOROSCOPY: Radiation Exposure Index (as provided by the fluoroscopic device): 8 mGy air Kerma TECHNIQUE: The procedure, risks (including but not limited to bleeding, infection, organ damage ), benefits, and alternatives were explained to the patient. Questions regarding the procedure were encouraged and answered. The patient understands and consents to the procedure. Survey ultrasound of the abdomen was performed and an appropriate skin entry site was identified. Skin site was marked, prepped with chlorhexidine, and draped in usual sterile fashion, and infiltrated locally with 1% lidocaine. Intravenous Fentanyl and Versed 1.5mg  were administered as conscious sedation during continuous monitoring of the patient's level of consciousness and physiological / cardiorespiratory status by the radiology RN, with a total moderate sedation time of 11 minutes. Under real-time ultrasound guidance, gallbladder was accessed using a transhepatic approach with a 21-gauge needle. Ultrasound image documentation was saved. Bile returned through the hub. Needle  was exchanged over a 018 guidewire for transitional dilator which allowed placement of 035 J wire. Over this, a 10.2 French pigtail catheter was advanced and formed centrally in the gallbladder lumen. 50 mL of dark bile were aspirated, sample sent for Gram stain and culture. Small contrast injection confirmed appropriate position. Catheter secured externally with 0 Prolene suture and placed external drain bag. Patient tolerated the procedure well. COMPLICATIONS: COMPLICATIONS none IMPRESSION: 1. Technically successful percutaneous cholecystostomy tube placement with ultrasound and fluoroscopic guidance. Electronically Signed   By: Corlis Leak M.D.   On: 02/23/2023 16:51        Scheduled Meds:  apixaban  5 mg Oral BID   Chlorhexidine Gluconate Cloth  6 each Topical Q0600   insulin aspart  0-15 Units Subcutaneous TID WC   insulin aspart  0-5 Units Subcutaneous QHS   levothyroxine  100 mcg Oral Q0600   midodrine  10 mg Oral Q8H   mouth rinse  15 mL Mouth Rinse 4 times per day   sodium chloride flush  5 mL Intracatheter Q8H   Continuous Infusions:  sodium chloride Stopped (02/23/23 2222)   sodium chloride     meropenem (MERREM) IV Stopped (02/25/23 0949)     LOS: 5 days    Time spent: 50 minutes    Dorcas Carrow, MD [facility]

## 2023-02-25 NOTE — Progress Notes (Signed)
eLink Physician-Brief Progress Note Patient Name: Shane Diaz DOB: [birth year] MRN: [id number]   Date of Service  02/25/2023  HPI/Events of Note  Recent admission for septic shock due to peritonitis with respiratory failure requiring BiPAP and then became hypotensive and bradycardic last night after his IR guided tube placement.  Now he is eating   eICU Interventions  Will initiate point-of-care glucose checks  Currently still on heparin, defer change to Eliquis to primary team.     Intervention Category Minor Interventions: Routine modifications to care plan (e.g. PRN medications for pain, fever)  Seymone Forlenza 02/25/2023, 5:13 AM

## 2023-02-25 NOTE — Evaluation (Signed)
Physical Therapy Evaluation Patient Details Name: Shane Diaz MRN: [id number] DOB: Dec 04, 1945 Today's Date: 02/25/2023  History of Present Illness  The pt is a 77 yo male presents to [facility] hospital on 02/20/2023 with acutely worsening SOB and cough, hypotension and syncope. Pt noted to have gallbladder wall thickening on 7/20, underwent percutaneous cholecystostomy catheter placement on 7/22. PMH includes: morbid obesity, HTN, afib s/p cardioversion x2 in 2021, atrial flutter with AV block, obesity hypoventilation syndrome on 2L O2, OSA, chronic hypoxic resp failure, venous insufficiency, DVT.  Clinical Impression  Pt presents to PT with deficits in functional mobility, gait, balance, strength, power, endurance. Pt demonstrates generalized weakness, with poor sitting balance initially and requiring physical assistance for both bed mobility and transfers. Pt fatigues quickly during transfers, and demonstrates limited ability to clear feet to step between surfaces, placing him at a high risk for falls. Pt will benefit from inpatient PT services at the time of discharge in an effort to reduce falls risk and restore independence in household mobility.         Assistance Recommended at Discharge Frequent or constant Supervision/Assistance  If plan is discharge home, recommend the following:  Can travel by private vehicle  A lot of help with walking and/or transfers;A lot of help with bathing/dressing/bathroom;Assistance with cooking/housework;Assist for transportation;Help with stairs or ramp for entrance   No    Equipment Recommendations Wheelchair (measurements PT)  Recommendations for Other Services       Functional Status Assessment Patient has had a recent decline in their functional status and demonstrates the ability to make significant improvements in function in a reasonable and predictable amount of time.     Precautions / Restrictions Precautions Precautions: Fall Precaution  Comments: drain RLQ Restrictions Weight Bearing Restrictions: No      Mobility  Bed Mobility Overal bed mobility: Needs Assistance Bed Mobility: Supine to Sit     Supine to sit: Mod assist, HOB elevated          Transfers Overall transfer level: Needs assistance Equipment used: Rolling walker (2 wheels) Transfers: Sit to/from Stand, Bed to chair/wheelchair/BSC Sit to Stand: Min assist   Step pivot transfers: Mod assist       General transfer comment: pt with poor foot clearance with step and pivot, sliding feet along ground, requires PT assist to guide walker and to slow descent into chair    Ambulation/Gait Ambulation/Gait assistance:  (deferred due to fatigue with transfer)                Stairs            Wheelchair Mobility     Tilt Bed    Modified Rankin (Stroke Patients Only)       Balance Overall balance assessment: Needs assistance Sitting-balance support: No upper extremity supported, Feet supported Sitting balance-Leahy Scale: Poor Sitting balance - Comments: minA, posterior lean Postural control: Posterior lean Standing balance support: Bilateral upper extremity supported, Reliant on assistive device for balance Standing balance-Leahy Scale: Poor                               Pertinent Vitals/Pain Pain Assessment Pain Assessment: No/denies pain    Home Living Family/patient expects to be discharged to:: Private residence Living Arrangements: Spouse/significant other Available Help at Discharge: Family;Available 24 hours/day Type of Home: House Home Access: Stairs to enter Entrance Stairs-Rails: Right;Left;Can reach both Entrance Stairs-Number of Steps: 3   Home Layout:  One level Home Equipment: Agricultural consultant (2 wheels)      Prior Function Prior Level of Function : Independent/Modified Independent             Mobility Comments: pt uses RW to walk short distances, reports 2 falls this year ADLs  Comments: sponge bathes at baseline, otherwise typically independent prior to recent decline     Hand Dominance        Extremity/Trunk Assessment   Upper Extremity Assessment Upper Extremity Assessment: Generalized weakness    Lower Extremity Assessment Lower Extremity Assessment: Generalized weakness RLE Deficits / Details: chronic BLE wounds with discoloration of lower legs LLE Deficits / Details: chronic BLE wounds with discoloration of lower legs    Cervical / Trunk Assessment Cervical / Trunk Assessment: Kyphotic;Other exceptions Cervical / Trunk Exceptions: large body habitus  Communication   Communication: Prefers language other than English (pt declines use of [place] interpreter)  Cognition Arousal/Alertness: Awake/alert Behavior During Therapy: WFL for tasks assessed/performed Overall Cognitive Status: Within Functional Limits for tasks assessed                                          General Comments General comments (skin integrity, edema, etc.): VSS on 4L [place]    Exercises     Assessment/Plan    PT Assessment Patient needs continued PT services  PT Problem List Decreased strength;Decreased activity tolerance;Decreased balance;Decreased mobility;Cardiopulmonary status limiting activity       PT Treatment Interventions DME instruction;Stair training;Gait training;Functional mobility training;Therapeutic activities;Therapeutic exercise;Balance training;Neuromuscular re-education;Patient/family education    PT Goals (Current goals can be found in the Care Plan section)  Acute Rehab PT Goals Patient Stated Goal: to return to independence PT Goal Formulation: With patient Time For Goal Achievement: 03/11/23 Potential to Achieve Goals: Fair    Frequency Min 1X/week     Co-evaluation               AM-PAC PT "6 Clicks" Mobility  Outcome Measure Help needed turning from your back to your side while in a flat bed without using  bedrails?: A Little Help needed moving from lying on your back to sitting on the side of a flat bed without using bedrails?: A Lot Help needed moving to and from a bed to a chair (including a wheelchair)?: A Lot Help needed standing up from a chair using your arms (e.g., wheelchair or bedside chair)?: A Lot Help needed to walk in hospital room?: Total Help needed climbing 3-5 steps with a railing? : Total 6 Click Score: 11    End of Session Equipment Utilized During Treatment: Oxygen Activity Tolerance: Patient limited by fatigue Patient left: in chair;with call bell/phone within reach;with chair alarm set;with family/visitor present Nurse Communication: Mobility status;Need for lift equipment (STEDY) PT Visit Diagnosis: Other abnormalities of gait and mobility (R26.89);Muscle weakness (generalized) (M62.81)    Time: 1610-9604 PT Time Calculation (min) (ACUTE ONLY): 30 min   Charges:   PT Evaluation $PT Eval Low Complexity: 1 Low   PT General Charges $$ ACUTE PT VISIT: 1 Visit         Arlyss Gandy, PT, DPT Acute Rehabilitation Office [phone]   Arlyss Gandy 02/25/2023, 11:17 AM

## 2023-02-25 NOTE — Progress Notes (Signed)
Subjective/Chief Complaint: Asleep, tol some diet   Objective: Vital signs in last 24 hours: Temp:  [97.8 F (36.6 C)-98.4 F (36.9 C)] 97.8 F (36.6 C) (07/24 0720) Pulse Rate:  [47-73] 61 (07/24 0630) Resp:  [15-27] 24 (07/24 0630) BP: (81-151)/(21-68) 81/21 (07/23 1500) SpO2:  [97 %-100 %] 98 % (07/24 0630) Arterial Line BP: (97-143)/(46-67) 105/47 (07/24 0630) FiO2 (%):  [40 %] 40 % (07/23 0923) Weight:  [134.5 kg] 134.5 kg (07/24 0354) Last BM Date :  (PTA)  Intake/Output from previous day: 07/23 0701 - 07/24 0700 In: 1392.2 [P.O.:600; I.V.:582.2; IV Piggyback:200] Out: 210 [Urine:200; Drains:10] Intake/Output this shift: No intake/output data recorded.  Abd soft tender at drain site, drain with bilious fluid  Lab Results:  Recent Labs    02/23/23 2145 02/23/23 2225 02/24/23 0306 02/24/23 0325  WBC 15.1*  --   --  26.0*  HGB 13.9   < > 15.3 14.2  HCT 43.9   < > 45.0 43.6  PLT 113*  --   --  139*   < > = values in this interval not displayed.   BMET Recent Labs    02/24/23 0600 02/25/23 0217  NA 135 133*  K 4.4 3.8  CL 95* 93*  CO2 23 30  GLUCOSE 148* 90  BUN 50* 57*  CREATININE 2.37* 2.32*  CALCIUM 8.4* 8.6*   PT/INR Recent Labs    02/23/23 1109  LABPROT 17.2*  INR 1.4*   ABG Recent Labs    02/23/23 2225 02/24/23 0306  PHART 7.350 7.402  HCO3 34.1* 31.7*    Studies/Results: DG CHEST PORT 1 VIEW  Result Date: 02/23/2023 CLINICAL DATA:  Respiratory distress EXAM: PORTABLE CHEST 1 VIEW COMPARISON:  Film from earlier in the same day. FINDINGS: Stable cardiomegaly is noted. Aortic calcifications are seen. Lungs are well aerated bilaterally. Increasing basilar atelectasis is noted when compared with prior exam particularly in the left base. No bony abnormality is noted. IMPRESSION: Increasing basilar atelectasis particularly on the left. Electronically Signed   By: Alcide Clever M.D.   On: 02/23/2023 23:29   DG CHEST PORT 1  VIEW  Result Date: 02/23/2023 CLINICAL DATA:  09604 Acute respiratory distress 66502 EXAM: PORTABLE CHEST - 1 VIEW COMPARISON:  02/20/2023 FINDINGS: No pneumothorax. Some worsening of patchy airspace opacities at the left lung base. Heart size and mediastinal contours are within normal limits. Aortic Atherosclerosis (ICD10-170.0). Probable small left pleural effusion. Visualized bones unremarkable. IMPRESSION: Worsening left basilar airspace disease with small effusion. Electronically Signed   By: Corlis Leak M.D.   On: 02/23/2023 16:53   IR BILIARY DRAIN PLACEMENT WITH CHOLANGIOGRAM  Result Date: 02/23/2023 CLINICAL DATA:  Clinical and imaging evidence of acute cholecystitis EXAM: PERCUTANEOUS CHOLECYSTOSTOMY TUBE PLACEMENT WITH ULTRASOUND AND FLUOROSCOPIC GUIDANCE FLUOROSCOPY: Radiation Exposure Index (as provided by the fluoroscopic device): 8 mGy air Kerma TECHNIQUE: The procedure, risks (including but not limited to bleeding, infection, organ damage ), benefits, and alternatives were explained to the patient. Questions regarding the procedure were encouraged and answered. The patient understands and consents to the procedure. Survey ultrasound of the abdomen was performed and an appropriate skin entry site was identified. Skin site was marked, prepped with chlorhexidine, and draped in usual sterile fashion, and infiltrated locally with 1% lidocaine. Intravenous Fentanyl and Versed 1.5mg  were administered as conscious sedation during continuous monitoring of the patient's level of consciousness and physiological / cardiorespiratory status by the radiology RN, with a total moderate sedation time of  11 minutes. Under real-time ultrasound guidance, gallbladder was accessed using a transhepatic approach with a 21-gauge needle. Ultrasound image documentation was saved. Bile returned through the hub. Needle was exchanged over a 018 guidewire for transitional dilator which allowed placement of 035 J wire.  Over this, a 10.2 French pigtail catheter was advanced and formed centrally in the gallbladder lumen. 50 mL of dark bile were aspirated, sample sent for Gram stain and culture. Small contrast injection confirmed appropriate position. Catheter secured externally with 0 Prolene suture and placed external drain bag. Patient tolerated the procedure well. COMPLICATIONS: COMPLICATIONS none IMPRESSION: 1. Technically successful percutaneous cholecystostomy tube placement with ultrasound and fluoroscopic guidance. Electronically Signed   By: Corlis Leak M.D.   On: 02/23/2023 16:51    Anti-infectives: Anti-infectives (From admission, onward)    Start     Dose/Rate Route Frequency Ordered Stop   02/23/23 1430  cefOXitin (MEFOXIN) 2 g in sodium chloride 0.9 % 100 mL IVPB        over 30 Minutes  Continuous PRN 02/23/23 1430 02/23/23 1430   02/21/23 1700  vancomycin (VANCOCIN) 2,250 mg in sodium chloride 0.9 % 500 mL IVPB  Status:  Discontinued        2,250 mg 261.3 mL/hr over 120 Minutes Intravenous Every 24 hours 02/20/23 2048 02/21/23 1122   02/21/23 1000  meropenem (MERREM) 1 g in sodium chloride 0.9 % 100 mL IVPB        1 g 200 mL/hr over 30 Minutes Intravenous Every 12 hours 02/21/23 0407     02/21/23 0100  piperacillin-tazobactam (ZOSYN) IVPB 3.375 g  Status:  Discontinued        3.375 g 12.5 mL/hr over 240 Minutes Intravenous Every 8 hours 02/20/23 2048 02/21/23 0407   02/20/23 1800  vancomycin (VANCOCIN) IVPB 1000 mg/200 mL premix       Placed in "Followed by" Linked Group   1,000 mg 200 mL/hr over 60 Minutes Intravenous  Once 02/20/23 1623 02/21/23 0028   02/20/23 1630  vancomycin (VANCOREADY) IVPB 1500 mg/300 mL       Placed in "Followed by" Linked Group   1,500 mg 150 mL/hr over 120 Minutes Intravenous  Once 02/20/23 1623 02/20/23 2300   02/20/23 1630  ceFEPIme (MAXIPIME) 2 g in sodium chloride 0.9 % 100 mL IVPB        2 g 200 mL/hr over 30 Minutes Intravenous  Once 02/20/23 1623 02/20/23  1728   02/20/23 1530  metroNIDAZOLE (FLAGYL) IVPB 500 mg        500 mg 100 mL/hr over 60 Minutes Intravenous  Once 02/20/23 1528 02/20/23 1705       Assessment/Plan:  77 year old male with cholecystitis S/P percutaneous cholecystostomy tube 7/22 - GS with abundant GNR Continue IV abx Can ADAT Will need drain study and return to office 4-6 weeks   Septic shock E.Coli bacteremia History of A-fib Hypertension OSA DVTs CAD Chronic heart failure  AKI on CKD  I reviewed hospitalist notes, last 24 h vitals and pain scores, last 48 h intake and output, last 24 h labs and trends, and last 24 h imaging results.    Emelia Loron 02/25/2023

## 2023-02-25 NOTE — Progress Notes (Signed)
Patient resting well on 4L [place].  No respiratory distress noted.  BiPAP available if needed. RT will continue to monitor.

## 2023-02-25 NOTE — Plan of Care (Signed)
Problem: Education: Goal: Knowledge of General Education information will improve Description: Including pain rating scale, medication(s)/side effects and non-pharmacologic comfort measures Outcome: Progressing   Problem: Health Behavior/Discharge Planning: Goal: Ability to manage health-related needs will improve Outcome: Progressing   Problem: Clinical Measurements: Goal: Ability to maintain clinical measurements within normal limits will improve Outcome: Progressing Goal: Will remain free from infection Outcome: Progressing Goal: Diagnostic test results will improve Outcome: Progressing Goal: Respiratory complications will improve Outcome: Progressing Goal: Cardiovascular complication will be avoided Outcome: Progressing   Problem: Activity: Goal: Risk for activity intolerance will decrease Outcome: Progressing   Problem: Nutrition: Goal: Adequate nutrition will be maintained Outcome: Progressing   Problem: Coping: Goal: Level of anxiety will decrease Outcome: Progressing   Problem: Elimination: Goal: Will not experience complications related to bowel motility Outcome: Progressing Goal: Will not experience complications related to urinary retention Outcome: Progressing   Problem: Pain Managment: Goal: General experience of comfort will improve Outcome: Progressing   Problem: Safety: Goal: Ability to remain free from injury will improve Outcome: Progressing   Problem: Skin Integrity: Goal: Risk for impaired skin integrity will decrease Outcome: Progressing   Problem: Education: Goal: Ability to describe self-care measures that may prevent or decrease complications (Diabetes Survival Skills Education) will improve Outcome: Progressing Goal: Individualized Educational Video(s) Outcome: Progressing   Problem: Coping: Goal: Ability to adjust to condition or change in health will improve Outcome: Progressing   Problem: Fluid Volume: Goal: Ability to  maintain a balanced intake and output will improve Outcome: Progressing   Problem: Health Behavior/Discharge Planning: Goal: Ability to identify and utilize available resources and services will improve Outcome: Progressing Goal: Ability to manage health-related needs will improve Outcome: Progressing   Problem: Metabolic: Goal: Ability to maintain appropriate glucose levels will improve Outcome: Progressing   Problem: Nutritional: Goal: Maintenance of adequate nutrition will improve Outcome: Progressing Goal: Progress toward achieving an optimal weight will improve Outcome: Progressing   Problem: Skin Integrity: Goal: Risk for impaired skin integrity will decrease Outcome: Progressing   Problem: Tissue Perfusion: Goal: Adequacy of tissue perfusion will improve Outcome: Progressing   Problem: Education: Goal: Knowledge of General Education information will improve Description: Including pain rating scale, medication(s)/side effects and non-pharmacologic comfort measures Outcome: Progressing   Problem: Health Behavior/Discharge Planning: Goal: Ability to manage health-related needs will improve Outcome: Progressing   Problem: Clinical Measurements: Goal: Ability to maintain clinical measurements within normal limits will improve Outcome: Progressing   Problem: Clinical Measurements: Goal: Will remain free from infection Outcome: Progressing   Problem: Clinical Measurements: Goal: Diagnostic test results will improve Outcome: Progressing   Problem: Clinical Measurements: Goal: Respiratory complications will improve Outcome: Progressing   Problem: Clinical Measurements: Goal: Cardiovascular complication will be avoided Outcome: Progressing   Problem: Activity: Goal: Risk for activity intolerance will decrease Outcome: Progressing   Problem: Nutrition: Goal: Adequate nutrition will be maintained Outcome: Progressing   Problem: Coping: Goal: Level of  anxiety will decrease Outcome: Progressing   Problem: Elimination: Goal: Will not experience complications related to bowel motility Outcome: Progressing   Problem: Pain Managment: Goal: General experience of comfort will improve Outcome: Progressing   Problem: Safety: Goal: Ability to remain free from injury will improve Outcome: Progressing   Problem: Skin Integrity: Goal: Risk for impaired skin integrity will decrease Outcome: Progressing   Problem: Education: Goal: Ability to describe self-care measures that may prevent or decrease complications (Diabetes Survival Skills Education) will improve Outcome: Progressing   Problem: Education: Goal: Individualized Educational Video(s) Outcome:  Progressing   Problem: Coping: Goal: Ability to adjust to condition or change in health will improve Outcome: Progressing   Problem: Fluid Volume: Goal: Ability to maintain a balanced intake and output will improve Outcome: Progressing   Problem: Health Behavior/Discharge Planning: Goal: Ability to identify and utilize available resources and services will improve Outcome: Progressing   Problem: Health Behavior/Discharge Planning: Goal: Ability to manage health-related needs will improve Outcome: Progressing   Problem: Metabolic: Goal: Ability to maintain appropriate glucose levels will improve Outcome: Progressing   Problem: Skin Integrity: Goal: Risk for impaired skin integrity will decrease Outcome: Progressing   Problem: Nutritional: Goal: Maintenance of adequate nutrition will improve Outcome: Progressing   Problem: Nutritional: Goal: Maintenance of adequate nutrition will improve Outcome: Progressing   Problem: Nutritional: Goal: Progress toward achieving an optimal weight will improve Outcome: Progressing   Problem: Tissue Perfusion: Goal: Adequacy of tissue perfusion will improve Outcome: Progressing

## 2023-02-26 DIAGNOSIS — J9601 Acute respiratory failure with hypoxia: Secondary | ICD-10-CM | POA: Diagnosis not present

## 2023-02-26 DIAGNOSIS — A419 Sepsis, unspecified organism: Secondary | ICD-10-CM | POA: Diagnosis not present

## 2023-02-26 DIAGNOSIS — I5033 Acute on chronic diastolic (congestive) heart failure: Secondary | ICD-10-CM | POA: Diagnosis not present

## 2023-02-26 DIAGNOSIS — R6521 Severe sepsis with septic shock: Secondary | ICD-10-CM | POA: Diagnosis not present

## 2023-02-26 LAB — COMPREHENSIVE METABOLIC PANEL
ALT: 19 U/L (ref 0–44)
AST: 32 U/L (ref 15–41)
Albumin: 2.2 g/dL — ABNORMAL LOW (ref 3.5–5.0)
Alkaline Phosphatase: 165 U/L — ABNORMAL HIGH (ref 38–126)
Anion gap: 10 (ref 5–15)
Calcium: 8.9 mg/dL (ref 8.9–10.3)
Chloride: 93 mmol/L — ABNORMAL LOW (ref 98–111)
Creatinine, Ser: 2.04 mg/dL — ABNORMAL HIGH (ref 0.61–1.24)
GFR, Estimated: 33 mL/min — ABNORMAL LOW (ref >60)
Potassium: 3.9 mmol/L (ref 3.5–5.1)
Total Bilirubin: 0.7 mg/dL (ref 0.3–1.2)
Total Protein: 6 g/dL — ABNORMAL LOW (ref 6.5–8.1)

## 2023-02-26 LAB — CBC WITH DIFFERENTIAL/PLATELET
Abs Immature Granulocytes: 0.14 10*3/uL — ABNORMAL HIGH (ref 0.00–0.07)
Basophils Absolute: 0 10*3/uL (ref 0.0–0.1)
Basophils Relative: 1 %
Eosinophils Absolute: 0.2 10*3/uL (ref 0.0–0.5)
Eosinophils Relative: 2 %
HCT: 41.1 % (ref 39.0–52.0)
Hemoglobin: 13.2 g/dL (ref 13.0–17.0)
Immature Granulocytes: 2 %
Lymphocytes Relative: 17 %
Lymphs Abs: 1.3 10*3/uL (ref 0.7–4.0)
MCH: 32.8 pg (ref 26.0–34.0)
MCHC: 32.1 g/dL (ref 30.0–36.0)
Monocytes Absolute: 0.8 10*3/uL (ref 0.1–1.0)
Neutro Abs: 5.1 10*3/uL (ref 1.7–7.7)
Neutrophils Relative %: 67 %
Platelets: 130 10*3/uL — ABNORMAL LOW (ref 150–400)
RBC: 4.02 MIL/uL — ABNORMAL LOW (ref 4.22–5.81)
RDW: 15.6 % — ABNORMAL HIGH (ref 11.5–15.5)
WBC: 7.6 10*3/uL (ref 4.0–10.5)

## 2023-02-26 LAB — GLUCOSE, CAPILLARY
Glucose-Capillary: 89 mg/dL (ref 70–99)
Glucose-Capillary: 129 mg/dL — ABNORMAL HIGH (ref 70–99)
Glucose-Capillary: 89 mg/dL (ref 70–99)
Glucose-Capillary: 117 mg/dL — ABNORMAL HIGH (ref 70–99)

## 2023-02-26 LAB — AEROBIC/ANAEROBIC CULTURE W GRAM STAIN (SURGICAL/DEEP WOUND): Gram Stain: NONE SEEN

## 2023-02-26 LAB — PHOSPHORUS: Phosphorus: 3.6 mg/dL (ref 2.5–4.6)

## 2023-02-26 LAB — CULTURE, BLOOD (ROUTINE X 2)

## 2023-02-26 LAB — MAGNESIUM: Magnesium: 2.2 mg/dL (ref 1.7–2.4)

## 2023-02-26 MED ORDER — FUROSEMIDE 10 MG/ML IJ SOLN
20.0000 mg | Freq: Once | INTRAMUSCULAR | Status: AC
  Administered 2023-02-26: 20 mg via INTRAVENOUS
  Filled 2023-02-26: qty 2

## 2023-02-26 NOTE — Progress Notes (Signed)
Referring Physician(s): Dr Dwain Sarna   Supervising Physician: Roanna Banning  Patient Status:  [facility] - In-pt  Chief Complaint:  Acute cholecystitis S/P percutaneous cholecystostomy tube 7/22 by Dr. Deanne Coffer   Subjective:  Patient sitting in bed watching TV, NAD. RN at bedside.  Denies any complaints today, tolerating PO.  States that he is ready to go home.  Discussed f/u and drain care with the patient.   Allergies: Diltiazem and Amiodarone  Medications: Prior to Admission medications   Medication Sig Start Date End Date Taking? Authorizing Provider  apixaban (ELIQUIS) 5 MG TABS tablet Take 1 tablet (5 mg total) by mouth 2 (two) times daily. 10/23/22  Yes [name], [name], MD  bisacodyl 5 MG EC tablet Take 10 mg by mouth daily as needed for moderate constipation.   Yes [provider]  bismuth subsalicylate (PEPTO BISMOL) 262 MG/15ML suspension Take 30 mLs by mouth as needed for diarrhea or loose stools or indigestion.   Yes [provider]  bumetanide (BUMEX) 2 MG tablet TAKE 2 TABLETS BY MOUTH TWICE A DAY 01/12/23  Yes [name], [name], MD  levothyroxine (SYNTHROID) 100 MCG tablet Take 1 tablet (100 mcg total) by mouth daily at 6 (six) AM. 02/13/23 03/15/23 Yes [name], [name], MD  midodrine (PROAMATINE) 5 MG tablet Take 1 tablet (5 mg total) by mouth 2 (two) times daily with a meal. 02/12/23 03/14/23 Yes [name], [name], MD  nitroGLYCERIN (NITROSTAT) 0.4 MG SL tablet Place 1 tablet (0.4 mg total) under the tongue every 5 (five) minutes x 3 doses as needed for chest pain. 12/04/21  Yes [name], [name], MD     Vital Signs: BP 125/66 (BP Location: Left Leg)   Pulse (!) 58   Temp 97.6 F (36.4 C) (Oral)   Resp 19   Ht 5\' 8"  (1.727 m)   Wt 296 lb 8.3 oz (134.5 kg)   SpO2 96%   BMI 45.09 kg/m   Physical Exam Vitals reviewed.  Constitutional:      General: He is not in acute distress.    Appearance: He is obese. He is not ill-appearing.  HENT:      Head: Normocephalic and atraumatic.  Eyes:     Extraocular Movements: Extraocular movements intact.  Pulmonary:     Effort: Pulmonary effort is normal.  Abdominal:     Palpations: Abdomen is soft.     Comments: Positive RUQ drain to a gravity bag. Site is unremarkable with no erythema, edema, tenderness, bleeding or drainage. Suture and stat lock in place. No dressing.  Trace of  orange-brown colored fluid noted in the bag. Drain aspirates and flushes well.    Skin:    General: Skin is warm and dry.     Coloration: Skin is not cyanotic or pale.  Neurological:     Mental Status: He is alert.  Psychiatric:        Mood and Affect: Mood normal.        Behavior: Behavior normal.     Imaging: DG CHEST PORT 1 VIEW  Result Date: 02/23/2023 CLINICAL DATA:  Respiratory distress EXAM: PORTABLE CHEST 1 VIEW COMPARISON:  Film from earlier in the same day. FINDINGS: Stable cardiomegaly is noted. Aortic calcifications are seen. Lungs are well aerated bilaterally. Increasing basilar atelectasis is noted when compared with prior exam particularly in the left base. No bony abnormality is noted. IMPRESSION: Increasing basilar atelectasis particularly on the left. Electronically Signed   By: Alcide Clever M.D.   On:  02/23/2023 23:29   DG CHEST PORT 1 VIEW  Result Date: 02/23/2023 CLINICAL DATA:  65784 Acute respiratory distress 66502 EXAM: PORTABLE CHEST - 1 VIEW COMPARISON:  02/20/2023 FINDINGS: No pneumothorax. Some worsening of patchy airspace opacities at the left lung base. Heart size and mediastinal contours are within normal limits. Aortic Atherosclerosis (ICD10-170.0). Probable small left pleural effusion. Visualized bones unremarkable. IMPRESSION: Worsening left basilar airspace disease with small effusion. Electronically Signed   By: Corlis Leak M.D.   On: 02/23/2023 16:53   IR BILIARY DRAIN PLACEMENT WITH CHOLANGIOGRAM  Result Date: 02/23/2023 CLINICAL DATA:  Clinical and imaging evidence of  acute cholecystitis EXAM: PERCUTANEOUS CHOLECYSTOSTOMY TUBE PLACEMENT WITH ULTRASOUND AND FLUOROSCOPIC GUIDANCE FLUOROSCOPY: Radiation Exposure Index (as provided by the fluoroscopic device): 8 mGy air Kerma TECHNIQUE: The procedure, risks (including but not limited to bleeding, infection, organ damage ), benefits, and alternatives were explained to the patient. Questions regarding the procedure were encouraged and answered. The patient understands and consents to the procedure. Survey ultrasound of the abdomen was performed and an appropriate skin entry site was identified. Skin site was marked, prepped with chlorhexidine, and draped in usual sterile fashion, and infiltrated locally with 1% lidocaine. Intravenous Fentanyl and Versed 1.5mg  were administered as conscious sedation during continuous monitoring of the patient's level of consciousness and physiological / cardiorespiratory status by the radiology RN, with a total moderate sedation time of 11 minutes. Under real-time ultrasound guidance, gallbladder was accessed using a transhepatic approach with a 21-gauge needle. Ultrasound image documentation was saved. Bile returned through the hub. Needle was exchanged over a 018 guidewire for transitional dilator which allowed placement of 035 J wire. Over this, a 10.2 French pigtail catheter was advanced and formed centrally in the gallbladder lumen. 50 mL of dark bile were aspirated, sample sent for Gram stain and culture. Small contrast injection confirmed appropriate position. Catheter secured externally with 0 Prolene suture and placed external drain bag. Patient tolerated the procedure well. COMPLICATIONS: COMPLICATIONS none IMPRESSION: 1. Technically successful percutaneous cholecystostomy tube placement with ultrasound and fluoroscopic guidance. Electronically Signed   By: Corlis Leak M.D.   On: 02/23/2023 16:51    Labs:  CBC: Recent Labs    02/23/23 0439 02/23/23 1611 02/23/23 2145  02/23/23 2225 02/24/23 0306 02/24/23 0325 02/26/23 0103  WBC 12.6*  --  15.1*  --   --  26.0* 7.6  HGB 13.7   < > 13.9 15.3 15.3 14.2 13.2  HCT 43.3   < > 43.9 45.0 45.0 43.6 41.1  PLT 126*  --  113*  --   --  139* 130*   < > = values in this interval not displayed.    COAGS: Recent Labs    02/23/23 1109 02/24/23 0404 02/24/23 1545 02/24/23 2330 02/25/23 0745  INR 1.4*  --   --   --   --   APTT  --  58* 68* 62* 68*    BMP: Recent Labs    02/23/23 2145 02/23/23 2225 02/24/23 0306 02/24/23 0600 02/25/23 0217 02/26/23 0103  NA 132*   < > 132* 135 133* 134*  K 3.0*   < > 6.2* 4.4 3.8 3.9  CL 91*  --   --  95* 93* 93*  CO2 27  --   --  23 30 31   GLUCOSE 251*  --   --  148* 90 90  BUN 47*  --   --  50* 57* 56*  CALCIUM 8.5*  --   --  8.4* 8.6* 8.9  CREATININE 2.35*  --   --  2.37* 2.32* 2.04*  GFRNONAA 28*  --   --  28* 28* 33*   < > = values in this interval not displayed.    LIVER FUNCTION TESTS: Recent Labs    02/20/23 1500 02/21/23 1045 02/22/23 0618 02/26/23 0103  BILITOT 2.6* 1.6* 1.1 0.7  AST 63* 39 27 32  ALT 32 25 21 19   ALKPHOS 129* 86 90 165*  PROT 9.6* 6.8 6.2* 6.0*  ALBUMIN 4.2 2.7* 2.4* 2.2*    Assessment and Plan:  77 y.o. male with CHF, hypertension, CKD stage IIIb, atrial fibrillation, OSA, acute cholecystitis with E. coli bacteremia felt to be secondary to acute cholecystitis s/p perc chole by Dr. Deanne Coffer on 02/23/23.   VSS WBCc wnl (26.0>>7.6) LFT with elevated alk phos 165  Cx E coli  Repeat blood cx from 7/22 showing NG x 2 days  Overnight output 30 mL   Drain Location: RUQ Size: Fr size: 10 Fr Date of placement: 7/22  Currently to: Drain collection device: gravity 24 hour output:  Output by Drain (mL) 02/24/23 0701 - 02/24/23 1900 02/24/23 1901 - 02/25/23 0700 02/25/23 0701 - 02/25/23 1900 02/25/23 1901 - 02/26/23 0700 02/26/23 0701 - 02/26/23 0756  Biliary Tube Cook slip-coat 10 Fr. RUQ  10 0 30     Interval imaging/drain  manipulation:  None   Current examination: Flushes/aspirates easily.  Insertion site unremarkable. Suture and stat lock in place. Dressed appropriately.   Plan: Continue TID flushes with 5 cc NS. Record output Q shift. Dressing changes QD or PRN if soiled.  Call IR APP or on call IR MD if difficulty flushing or sudden change in drain output.  Repeat imaging/possible drain injection once output < 10 mL/QD (excluding flush material). Consideration for drain removal if output is < 10 mL/QD (excluding flush material), pending discussion with the providing surgical service.  Discharge planning: Outpatient f/u has been set up. Please contact IR APP or on call IR MD prior to patient d/c to ensure appropriate follow up plans are in place. Typically patient will follow up with IR clinic 6-8 weeks post placement for repeat imaging/possible drain injection. IR scheduler will contact patient with date/time of appointment. Patient will need to flush drain QD with 5 cc NS, record output QD, dressing changes every 2-3 days or earlier if soiled.   IR will continue to follow - please call with questions or concerns.   Electronically Signed: Willette Brace, PA-C 02/26/2023, 7:54 AM   I spent a total of 15 Minutes at the the patient's bedside AND on the patient's hospital floor or unit, greater than 50% of which was counseling/coordinating care for perc chole f/u   This chart was dictated using voice recognition software.  Despite best efforts to proofread,  errors can occur which can change the documentation meaning.

## 2023-02-26 NOTE — Plan of Care (Signed)

## 2023-02-26 NOTE — Evaluation (Signed)
Occupational Therapy Evaluation Patient Details Name: Shane Diaz MRN: [id number] DOB: [birth year] Today's Date: 02/26/2023   History of Present Illness The pt is a 77 yo male presents to [facility] hospital on 02/20/2023 with acutely worsening SOB and cough, hypotension and syncope. Pt noted to have gallbladder wall thickening on 7/20, underwent percutaneous cholecystostomy catheter placement on 7/22. PMH includes: morbid obesity, HTN, afib s/p cardioversion x2 in 2021, atrial flutter with AV block, obesity hypoventilation syndrome on 2L O2, OSA, chronic hypoxic resp failure, venous insufficiency, DVT.   Clinical Impression   At baseline, pt completes UB ADLs with Mod I to Max assist, LB ADLs with Max assist, and functional transfers/mobility short distances in the home with a RW with Supervision. Pt now presents with decreased activity tolerance, generalized B UE weakness decreased balance during functional tasks, and decreased safety and independence with ADLs, bed mobility during/in preparation for functional tasks, and functional mobility/transfers. Pt currently demonstrates ability to complete UB ADLs with Set up to Max assist, LB ADLs with Total assist +2 from bed level, and bed mobility with Max assist. Pt will benefit from acute skilled OT services to address deficits outlined below, decrease caregiver burden, and increase safety and independence with ADLs, bed mobility during/in preparation for functional tasks, and functional transfers. Post acute discharge, pt will benefit from intensive inpatient skilled rehab services < 3 hours per day to maximize rehab potential.      Recommendations for follow up therapy are one component of a multi-disciplinary discharge planning process, led by the attending physician.  Recommendations may be updated based on patient status, additional functional criteria and insurance authorization.   Assistance Recommended at Discharge Frequent or constant  Supervision/Assistance  Patient can return home with the following Two people to help with walking and/or transfers;Two people to help with bathing/dressing/bathroom;Assistance with cooking/housework;Assistance with feeding;Assist for transportation;Help with stairs or ramp for entrance    Functional Status Assessment  Patient has had a recent decline in their functional status and demonstrates the ability to make significant improvements in function in a reasonable and predictable amount of time.  Equipment Recommendations  Other (comment) (Defer to next level of care)    Recommendations for Other Services       Precautions / Restrictions Precautions Precautions: Fall Precaution Comments: drain RLQ; Needs [place] interpreter Restrictions Weight Bearing Restrictions: No      Mobility Bed Mobility Overal bed mobility: Needs Assistance Bed Mobility: Rolling Rolling: Max assist         General bed mobility comments: Pt declined sitting EOB this session secondary to increased fatigue after sitting EOB with PT the previous day. OT educated pt in importance of sitting EOB and OOB activities with pt verbalizing understanding, but continuing to decline this session. Pt states he will attempt sitting EOB next session.    Transfers                   General transfer comment: Pt declined OOB transfer this session.      Balance Overall balance assessment: Needs assistance Sitting-balance support: Single extremity supported Sitting balance-Leahy Scale: Poor Sitting balance - Comments: in long sitting in the bed                                   ADL either performed or assessed with clinical judgement   ADL Overall ADL's : Needs assistance/impaired Eating/Feeding: Set up;Sitting   Grooming: Min guard;Sitting  Upper Body Bathing: Maximal assistance;Bed level   Lower Body Bathing: Total assistance;+2 for physical assistance;Bed level   Upper Body  Dressing : Moderate assistance;Maximal assistance;Bed level   Lower Body Dressing: Total assistance;+2 for physical assistance;Bed level     Toilet Transfer Details (indicate cue type and reason): Deferred this session. Toileting- Clothing Manipulation and Hygiene: Total assistance;+2 for physical assistance;Bed level         General ADL Comments: Pt reports he would like to address toileting and sitting tolerance/balance during ADLs, but does not want to focus on bathing/dressing during OT sessions as he is content performing bathing dressing with assist of his wife. OT recommends additional rehab prior to return home for pt to retrun to PLOF in order to decrease caregiver burden.     Vision Baseline Vision/History: 1 Wears glasses (reading glasses; hx of B cataracts with B surgery) Ability to See in Adequate Light: 0 Adequate Patient Visual Report: No change from baseline       Perception     Praxis      Pertinent Vitals/Pain Pain Assessment Pain Assessment: No/denies pain Pain Intervention(s): Monitored during session     Hand Dominance Right   Extremity/Trunk Assessment Upper Extremity Assessment Upper Extremity Assessment: Generalized weakness   Lower Extremity Assessment Lower Extremity Assessment: Defer to PT evaluation   Cervical / Trunk Assessment Cervical / Trunk Assessment: Kyphotic;Other exceptions Cervical / Trunk Exceptions: large body habitus   Communication Communication Communication: Prefers language other than [place];No difficulties ([place] interpreter used during skilled OT eval)   Cognition Arousal/Alertness: Awake/alert Behavior During Therapy: WFL for tasks assessed/performed Overall Cognitive Status: Within Functional Limits for tasks assessed                                 General Comments: Pt AAOx4, demonstrates ability to follow 1 step commands, and was pleasant throughout session.     General Comments  VSS on 4L  continuous O2 through nasal cannula throughout session. Pt's wife present throughout session. Pt demonstrates fair English proficency but benefits from use of [place] interpreter throughout session.    Exercises     Shoulder Instructions      Home Living Family/patient expects to be discharged to:: Private residence Living Arrangements: Spouse/significant other Available Help at Discharge: Family;Available 24 hours/day Type of Home: House Home Access: Stairs to enter [employer] of Steps: 2 Entrance Stairs-Rails: Right;Left;Can reach both Home Layout: One level     Bathroom Shower/Tub: Chief Strategy Officer: Handicapped height Bathroom Accessibility: Yes How Accessible: Accessible via walker Home Equipment: Rolling Walker (2 wheels);Shower seat;Hand held shower head;Grab bars - toilet   Additional Comments: Pt and wife report they are interested in remodeling bathroom to install a walk-in shower.      Prior Functioning/Environment Prior Level of Function : Needs assist             Mobility Comments: At baseline, pt uses RW to walk short distances, reports 2 falls this year. ADLs Comments: At baseline, pt perfroms sponge baths at sink with Mod to Max assist, dresses with Mod to Max assist, and performs self-feeding, grooming, and all steps of toileting task Mod I.        OT Problem List: Decreased strength;Decreased activity tolerance;Impaired balance (sitting and/or standing);Decreased safety awareness;Decreased knowledge of use of DME or AE;Cardiopulmonary status limiting activity      OT Treatment/Interventions: Self-care/ADL training;Therapeutic exercise;DME and/or AE  instruction;Patient/family education;Balance training;Therapeutic activities    OT Goals(Current goals can be found in the care plan section) Acute Rehab OT Goals Patient Stated Goal: To return home with assist of wife OT Goal Formulation: With patient/family Time For Goal  Achievement: 03/12/23 Potential to Achieve Goals: Good ADL Goals Pt Will Perform Grooming: with supervision;sitting (sitting EOB for 5 minutes or more with Fair balance) Pt Will Transfer to Toilet: with min assist;ambulating;bedside commode (with least restrictive AD) Pt Will Perform Toileting - Clothing Manipulation and hygiene: with mod assist;sitting/lateral leans;sit to/from stand Pt/caregiver will Perform Home Exercise Program: Increased strength;Both right and left upper extremity;With theraband;With theraputty;With Supervision;With written HEP provided  OT Frequency: Min 1X/week    Co-evaluation              AM-PAC OT "6 Clicks" Daily Activity     Outcome Measure Help from another person eating meals?: A Little Help from another person taking care of personal grooming?: A Little Help from another person toileting, which includes using toliet, bedpan, or urinal?: Total Help from another person bathing (including washing, rinsing, drying)?: A Lot Help from another person to put on and taking off regular upper body clothing?: A Lot Help from another person to put on and taking off regular lower body clothing?: Total 6 Click Score: 12   End of Session Nurse Communication: Mobility status  Activity Tolerance: Patient limited by fatigue Patient left: in bed;with call bell/phone within reach;with family/visitor present  OT Visit Diagnosis: Other abnormalities of gait and mobility (R26.89);Unsteadiness on feet (R26.81);Repeated falls (R29.6);Muscle weakness (generalized) (M62.81)                Time: 4098-1191 OT Time Calculation (min): 39 min Charges:  OT General Charges $OT Visit: 1 Visit OT Evaluation $OT Eval Moderate Complexity: 1 Mod OT Treatments $Self Care/Home Management : 8-22 mins  Safira Proffit "Orson Eva., OTR/L, MA Acute Rehab [phone]   Lendon Colonel 02/26/2023, 12:21 PM

## 2023-02-26 NOTE — TOC Initial Note (Signed)
Transition of Care [facility]) - Initial/Assessment Note    Patient Details  Name: Shane Diaz MRN: [id number] Date of Birth: 1945/09/05  [facility]) CM/SW Contact:    Mearl Latin, LCSW Phone Number: 02/26/2023, 4:23 PM  Clinical Narrative:                 CSW received consult for possible SNF at time of discharge. CSW spoke with patient and his daughter at bedside. Patient spoke in [place] and [place] without need for interpreter. Patient reported that he would like home health services and does not want to go to SNF for rehab.  Daughter reported agreement with home home health and stated he is active with [facility]. CSW discussed equipment needs and patient has a walker, wheelchair, crutches, and oxygen at home.CSW offered 3in1 bedside commode but they declined. CSW confirmed PCP and address. Patient requests [facility] for transport home and to let daughter know of any plans (spouse does not speak [place] well). No further questions reported at this time.    Expected Discharge Plan: Home w Home Health Services Barriers to Discharge: Continued Medical Work up   Patient Goals and CMS Choice Patient states their goals for this hospitalization and ongoing recovery are:: Return home CMS Medicare.gov Compare Post Acute Care list provided to:: Patient Choice offered to / list presented to : Patient, Adult Children [place] ownership interest in [facility].provided to:: Patient    Expected Discharge Plan and Services In-house Referral: Clinical Social Work Discharge Planning Services: CM Consult Post Acute Care Choice: Home Health Living arrangements for the past 2 months: Single Family Home                                      Prior Living Arrangements/Services Living arrangements for the past 2 months: Single Family Home Lives with:: Spouse Patient language and need for interpreter reviewed:: Yes Do you feel safe going back to the place where you live?:  Yes      Need for Family Participation in Patient Care: Yes (Comment) Care giver support system in place?: Yes (comment) Current home services: DME (Wc, walker, Oxygen, crutches) Criminal Activity/Legal Involvement Pertinent to Current Situation/Hospitalization: No - Comment as needed  Activities of Daily Living Home Assistive Devices/Equipment: BIPAP ADL Screening (condition at time of admission) Patient's cognitive ability adequate to safely complete daily activities?: Yes Is the patient deaf or have difficulty hearing?: No Does the patient have difficulty seeing, even when wearing glasses/contacts?: No Does the patient have difficulty concentrating, remembering, or making decisions?: No Patient able to express need for assistance with ADLs?: Yes Does the patient have difficulty dressing or bathing?: Yes Independently performs ADLs?: No Communication: Independent Dressing (OT): Needs assistance Is this a change from baseline?: Pre-admission baseline Grooming: Independent Feeding: Independent Bathing: Independent Toileting: Independent In/Out Bed: Needs assistance Is this a change from baseline?: Change from baseline, expected to last <3 days Walks in Home: Independent with device (comment) Is this a change from baseline?: Pre-admission baseline Does the patient have difficulty walking or climbing stairs?: Yes Weakness of Legs: Both Weakness of Arms/Hands: None  Permission Sought/Granted Permission sought to share information with : Facility Medical sales representative, Family Supports Permission granted to share information with : Yes, Verbal Permission Granted  Share Information with NAME: Dianna  Permission granted to share info w AGENCY: HH  Permission granted to share info w Relationship: Daughter  Permission granted to share info w Contact Information: [phone]  Emotional Assessment Appearance:: Appears stated age Attitude/Demeanor/Rapport: Gracious Affect (typically  observed): Accepting, Appropriate Orientation: : Oriented to Self, Oriented to Place, Oriented to  Time, Oriented to Situation Alcohol / Substance Use: Not Applicable Psych Involvement: No (comment)  Admission diagnosis:  SOB (shortness of breath) [R06.02] Septic shock (HCC) [A41.9, R65.21] Patient Active Problem List   Diagnosis Date Noted   Acute cholecystitis 02/23/2023   E coli bacteremia 02/23/2023   Septic shock (HCC) 02/20/2023   CKD stage 3b, GFR 30-44 ml/min (HCC) 02/07/2023   Acute hypoxic respiratory failure (HCC) 02/06/2023   Acute on chronic respiratory failure with hypoxia and hypercapnia (HCC) 02/06/2023   Obesity hypoventilation syndrome (HCC) 02/06/2023   Right-sided heart failure (HCC) 02/06/2023   Cor pulmonale (chronic) (HCC) 02/06/2023   Diastolic heart failure preserved EF (congestive heart failure) 02/06/2023   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   Chronic venous insufficiency 02/06/2023   History of DVT 2015 (deep vein thrombosis) 02/06/2023   Hypothyroidism 02/06/2023   Acute respiratory failure with hypoxia and hypercapnia (HCC) 02/06/2023   Acute on chronic diastolic CHF (congestive heart failure) (HCC) 02/06/2023   Acute metabolic encephalopathy 02/06/2023   Posterior vitreous detachment of right eye 04/15/2022   Exudative age-related macular degeneration of left eye with active choroidal neovascularization (HCC) 04/15/2022   Early stage nonexudative age-related macular degeneration of both eyes 04/15/2022   Vitreomacular adhesion of left eye 04/15/2022   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   Hyperkalemia 09/16/2021   Acute on chronic diastolic HF (heart failure) (HCC)    Chronic wound bilateral lower extremity 2nd from chronic venous insufficiency 09/14/2021   Cellulitis- left leg 09/13/2021   Fall 09/13/2021   Chronic respiratory failure with hypoxia-2 to 4L L oxygen at baseline (HCC) 09/13/2021   Atrial flutter (HCC) 03/18/2021   Chronic heart failure with  preserved ejection fraction (HFpEF) (HCC) 03/18/2021   Prolonged QT interval 03/18/2021   Acute on chronic diastolic congestive heart failure (HCC)    Cellulitis of leg, right 09/24/2020   Pneumonia due to infectious organism 09/24/2020   Obesity, Class III, BMI 40-49.9 (morbid obesity) (HCC) 09/24/2020   Lactic acidosis 09/24/2020   Sepsis due to pneumonia (HCC) 09/24/2020   Acute diastolic heart failure (HCC) 08/03/2020   OSA (obstructive sleep apnea) 08/03/2020   DVT, bilateral lower limbs (HCC) hx of 08/03/2020   Coronary artery calcification seen on CT scan 08/03/2020   Atrial fibrillation with RVR (HCC) 08/01/2020   Iron deficiency anemia    Occult blood in stools    Atrial fibrillation, chronic (HCC) 07/07/2020   Pneumonia due to COVID-19 virus 05/08/2020   Sepsis (HCC) 05/08/2020   Atrial fibrillation with rapid ventricular response (HCC) 05/08/2020   Elevated troponin 05/08/2020   Essential hypertension 05/08/2020   Varicose veins of leg with complications 06/25/2015   Varicose veins of left lower extremity with complications 06/18/2015   Varicose veins of bilateral lower extremities with other complications 05/29/2015   Special screening for malignant neoplasms, colon 04/11/2014   PCP:  Ralene Ok, MD Pharmacy:   [facility] [id number] - [place], [place] - [address] [place] [address] Phone: [phone] Fax: [phone]  [facility] [address] [place] [address] Phone: [phone] Fax: [phone]     Social Determinants of Health (SDOH) Social History: SDOH Screenings   Food Insecurity: No Food Insecurity (02/24/2023)  Housing: Low Risk  (02/24/2023)  Transportation Needs:  No Transportation Needs (02/24/2023)  Utilities: Not At Risk (02/24/2023)  Alcohol Screen: Low Risk  (02/09/2023)  Financial Resource Strain: Low Risk  (02/09/2023)  Tobacco Use: Low Risk  (02/20/2023)   SDOH  Interventions:     Readmission Risk Interventions     No data to display

## 2023-02-26 NOTE — Progress Notes (Signed)
PROGRESS NOTE    Shane Diaz  WUJ:[id number] DOB: 1945/12/01 DOA: 02/20/2023 PCP: Ralene Ok, MD    Brief Narrative:   77 year old gentleman with chronic diastolic heart failure, hypertension, CKD stage IIIb, atrial fibrillation on apixaban, chronic venous insufficiency, chronic respiratory failure on home oxygen, sleep apnea on CPAP presented to the ER on 7/19 with acutely worsening shortness of breath and cough.  He was recently admitted with heart failure exacerbation and was discharged home on 7/11 after diuresis.  He arrived to the emergency room on CPAP reported syncopal event at home the night prior.  Patient also reported chest pain, back pain, abdominal pain and confusion.  In the emergency room initially hypotensive, he was given 1 L bolus with minimal improvement, he was started on peripheral Levophed and admitted to ICU.  He was found to have E. coli bacteremia due to acute cholecystitis. 7/22, transfer out of ICU, respiratory decompensation while undergoing percutaneous cholecystostomy and back to ICU. 7/22, percutaneous cholecystostomy by IR. 7/24, blood pressures are adequate.  Transferring out to progressive care unit.   Assessment & Plan:   Septic shock secondary to E. coli bacteremia, acute cholecystitis. Weaned off vasopressors.  Sepsis physiology improved.  Blood pressures are adequate. Blood cultures 7/19, ESBL E. coli and currently on meropenem Blood culture 7/22, negative so far. 7/22, IR did subcutaneous cholecystostomy.  Surgical cultures with abundant E. coli. Surgery following.  Planning to manage conservatively now. Tolerating full liquid diet, will advance to soft diet today Continue with IV antibiotics -Will need drain study and return to general surgery office in 4 to 6 weeks  Acute on chronic respiratory failure with hypoxemia and hypercapnia: Significant component of sleep apnea and obesity hypoventilation syndrome. Treated with BiPAP and now on  CPAP. Continue BiPAP as needed and at nights, keep on oxygen to keep saturation more than 90%. He is net positive, creatinine still higher than baseline, but improving, will start on gentle diuresis  Chronic atrial fibrillation:  -No further procedures anticipated, transitioned from heparin GTT to Eliquis   Chronic venous stasis: Extremity Doppler negative for DVT.  Local wound care.  Hypothyroidism: On Synthroid.  Continue.  Acute kidney injury on chronic kidney disease stage IV: Recent known creatinine 1.7-1.8. Creatinine gradually plateauing.  Holding GDMT  -Monitor on gentle diuresis  Stable to transfer to progressive bed.  Discontinue heparin and start Eliquis.  Discontinue A-line.   DVT prophylaxis: SCDs Start: 02/20/23 1715 apixaban (ELIQUIS) tablet 5 mg   Code Status: Full code Family Communication: None, will update Disposition Plan: Status is: Inpatient Remains inpatient appropriate because: Severe systemic disease     Consultants:  General surgery Gastroenterology Critical care Interventional radiology  Procedures:  Percutaneous cholecystostomy 7/22  Antimicrobials:  Meropenem 7/20----   Subjective:  No significant events overnight, he denies any complaints today, tolerating his full liquid diet  Objective: Vitals:   02/26/23 0609 02/26/23 0819 02/26/23 0820 02/26/23 1200  BP: 125/66  122/65 134/74  Pulse: (!) 58 62 65 63  Resp: 19 20 17 20   Temp: 97.6 F (36.4 C)  98.1 F (36.7 C) 98.5 F (36.9 C)  TempSrc: Oral  Oral Oral  SpO2: 96% 97% 95% 95%  Weight:      Height:        Intake/Output Summary (Last 24 hours) at 02/26/2023 1320 Last data filed at 02/26/2023 0500 Gross per 24 hour  Intake 20 ml  Output 1130 ml  Net -1110 ml   Filed Weights   02/23/23  0440 02/24/23 0434 02/25/23 0354  Weight: 135.3 kg 135.1 kg 134.5 kg    Examination:  Awake Alert, Oriented X 3, No new F.N deficits, Normal affect frail Symmetrical Chest wall  movement, entry at the bases, on 2 L oxygen Regular,No Gallops,Rubs or new Murmurs, No Parasternal Heave +ve B.Sounds, Abd Soft, No tenderness bilious drainage and percutaneous tube. No Cyanosis, Clubbing, + edema, No new Rash or bruise     Data Reviewed: I have personally reviewed following labs and imaging studies  CBC: Recent Labs  Lab 02/20/23 1500 02/20/23 1529 02/22/23 0618 02/22/23 1351 02/23/23 0439 02/23/23 1611 02/23/23 2145 02/23/23 2225 02/24/23 0306 02/24/23 0325 02/26/23 0103  WBC 15.5*   < > 19.1*  --  12.6*  --  15.1*  --   --  26.0* 7.6  NEUTROABS 13.9*  --   --   --   --   --   --   --   --   --  5.1  HGB 19.2*   < > 14.7   < > 13.7   < > 13.9 15.3 15.3 14.2 13.2  HCT 59.3*   < > 45.6   < > 43.3   < > 43.9 45.0 45.0 43.6 41.1  MCV 101.9*   < > 102.5*  --  103.8*  --  103.1*  --   --  101.6* 102.2*  PLT 230   < > 142*  --  126*  --  113*  --   --  139* 130*   < > = values in this interval not displayed.   Basic Metabolic Panel: Recent Labs  Lab 02/23/23 0439 02/23/23 1611 02/23/23 2145 02/23/23 2225 02/24/23 0306 02/24/23 0600 02/25/23 0217 02/26/23 0103  NA 133*   < > 132* 138 132* 135 133* 134*  K 3.6   < > 3.0* 3.2* 6.2* 4.4 3.8 3.9  CL 90*  --  91*  --   --  95* 93* 93*  CO2 30  --  27  --   --  23 30 31   GLUCOSE 95  --  251*  --   --  148* 90 90  BUN 44*  --  47*  --   --  50* 57* 56*  CREATININE 2.04*  --  2.35*  --   --  2.37* 2.32* 2.04*  CALCIUM 8.2*  --  8.5*  --   --  8.4* 8.6* 8.9  MG 2.2  --  1.9  --   --  2.1 2.0 2.2  PHOS 4.0  --  2.5  --   --  3.3 2.8 3.6   < > = values in this interval not displayed.   GFR: Estimated Creatinine Clearance: 40.7 mL/min (A) (by C-G formula based on SCr of 2.04 mg/dL (H)). Liver Function Tests: Recent Labs  Lab 02/20/23 1500 02/21/23 1045 02/22/23 0618 02/26/23 0103  AST 63* 39 27 32  ALT 32 25 21 19   ALKPHOS 129* 86 90 165*  BILITOT 2.6* 1.6* 1.1 0.7  PROT 9.6* 6.8 6.2* 6.0*  ALBUMIN  4.2 2.7* 2.4* 2.2*   Recent Labs  Lab 02/22/23 0618  LIPASE 24  AMYLASE 27*   Recent Labs  Lab 02/24/23 0612  AMMONIA 24   Coagulation Profile: Recent Labs  Lab 02/23/23 1109  INR 1.4*   Cardiac Enzymes: No results for input(s): "CKTOTAL", "CKMB", "CKMBINDEX", "TROPONINI" in the last 168 hours. BNP (last 3 results) No results for input(s): "PROBNP" in the  last 8760 hours. HbA1C: Recent Labs    02/25/23 0541  HGBA1C 6.2*   CBG: Recent Labs  Lab 02/25/23 1639 02/25/23 1710 02/25/23 2100 02/26/23 0827 02/26/23 1249  GLUCAP 119* 103* 100* 89 129*   Lipid Profile: No results for input(s): "CHOL", "HDL", "LDLCALC", "TRIG", "CHOLHDL", "LDLDIRECT" in the last 72 hours. Thyroid Function Tests: No results for input(s): "TSH", "T4TOTAL", "FREET4", "T3FREE", "THYROIDAB" in the last 72 hours. Anemia Panel: No results for input(s): "VITAMINB12", "FOLATE", "FERRITIN", "TIBC", "IRON", "RETICCTPCT" in the last 72 hours. Sepsis Labs: Recent Labs  Lab 02/20/23 1500 02/20/23 1553 02/20/23 2008 02/22/23 0618  PROCALCITON 2.19  --   --   --   LATICACIDVEN  --  2.5* 1.6 0.8    Recent Results (from the past 240 hour(s))  SARS Coronavirus 2 by RT PCR (hospital order, performed in [facility] hospital lab) *cepheid single result test*     Status: None   Collection Time: 02/20/23  3:30 PM   Specimen: Nasal Swab  Result Value Ref Range Status   SARS Coronavirus 2 by RT PCR NEGATIVE NEGATIVE Final    Comment: Performed at [facility] Lab, [address] [place], [place] [address]  Blood culture (routine x 2)     Status: Abnormal   Collection Time: 02/20/23  3:53 PM   Specimen: BLOOD  Result Value Ref Range Status   Specimen Description BLOOD SITE NOT SPECIFIED  Final   Special Requests   Final    BOTTLES DRAWN AEROBIC AND ANAEROBIC Blood Culture adequate volume   Culture  Setup Time   Final    GRAM NEGATIVE RODS IN BOTH AEROBIC AND ANAEROBIC BOTTLES CRITICAL RESULT  CALLED TO, READ BACK BY AND VERIFIED WITH: J [facility]  02/21/23 MK Performed at [facility], [address] [place], [place] [address]    Culture (A)  Final    ESCHERICHIA COLI Confirmed Extended Spectrum Beta-Lactamase Producer (ESBL).  In bloodstream infections from ESBL organisms, carbapenems are preferred over piperacillin/tazobactam. They are shown to have a lower risk of mortality.    Report Status 02/25/2023 FINAL  Final   Organism ID, Bacteria ESCHERICHIA COLI  Final      Susceptibility   Escherichia coli - MIC*    AMPICILLIN >=32 RESISTANT Resistant     CEFEPIME 1 SENSITIVE Sensitive     CEFTAZIDIME RESISTANT Resistant     CEFTRIAXONE 16 RESISTANT Resistant     CIPROFLOXACIN RESISTANT Resistant     GENTAMICIN <=1 SENSITIVE Sensitive     IMIPENEM <=0.25 SENSITIVE Sensitive     TRIMETH/SULFA <=20 SENSITIVE Sensitive     AMPICILLIN/SULBACTAM >=32 RESISTANT Resistant     PIP/TAZO 16 SENSITIVE Sensitive     * ESCHERICHIA COLI  Blood Culture ID Panel (Reflexed)     Status: Abnormal   Collection Time: 02/20/23  3:53 PM  Result Value Ref Range Status   Enterococcus faecalis NOT DETECTED NOT DETECTED Final   Enterococcus Faecium NOT DETECTED NOT DETECTED Final   Listeria monocytogenes NOT DETECTED NOT DETECTED Final   Staphylococcus species NOT DETECTED NOT DETECTED Final   Staphylococcus aureus (BCID) NOT DETECTED NOT DETECTED Final   Staphylococcus epidermidis NOT DETECTED NOT DETECTED Final   Staphylococcus lugdunensis NOT DETECTED NOT DETECTED Final   Streptococcus species NOT DETECTED NOT DETECTED Final   Streptococcus agalactiae NOT DETECTED NOT DETECTED Final   Streptococcus pneumoniae NOT DETECTED NOT DETECTED Final   Streptococcus pyogenes NOT DETECTED NOT DETECTED Final   A.calcoaceticus-baumannii NOT DETECTED  NOT DETECTED Final   Bacteroides fragilis NOT DETECTED NOT DETECTED Final   Enterobacterales DETECTED (A) NOT DETECTED Final    Comment:  Enterobacterales represent a large order of gram negative bacteria, not a single organism. CRITICAL RESULT CALLED TO, READ BACK BY AND VERIFIED WITH: J WYLAND,PHARMD@0350  02/21/23 MK    Enterobacter cloacae complex NOT DETECTED NOT DETECTED Final   Escherichia coli DETECTED (A) NOT DETECTED Final    Comment: CRITICAL RESULT CALLED TO, READ BACK BY AND VERIFIED WITH: J WYLAND,PHARMD@0350  02/21/23 MK    Klebsiella aerogenes NOT DETECTED NOT DETECTED Final   Klebsiella oxytoca NOT DETECTED NOT DETECTED Final   Klebsiella pneumoniae NOT DETECTED NOT DETECTED Final   Proteus species NOT DETECTED NOT DETECTED Final   Salmonella species NOT DETECTED NOT DETECTED Final   Serratia marcescens NOT DETECTED NOT DETECTED Final   Haemophilus influenzae NOT DETECTED NOT DETECTED Final   Neisseria meningitidis NOT DETECTED NOT DETECTED Final   Pseudomonas aeruginosa NOT DETECTED NOT DETECTED Final   Stenotrophomonas maltophilia NOT DETECTED NOT DETECTED Final   Candida albicans NOT DETECTED NOT DETECTED Final   Candida auris NOT DETECTED NOT DETECTED Final   Candida glabrata NOT DETECTED NOT DETECTED Final   Candida krusei NOT DETECTED NOT DETECTED Final   Candida parapsilosis NOT DETECTED NOT DETECTED Final   Candida tropicalis NOT DETECTED NOT DETECTED Final   Cryptococcus neoformans/gattii NOT DETECTED NOT DETECTED Final   CTX-M ESBL DETECTED (A) NOT DETECTED Final    Comment: CRITICAL RESULT CALLED TO, READ BACK BY AND VERIFIED WITH: J WYLAND,PHARMD@0350  02/21/23 MK (NOTE) Extended spectrum beta-lactamase detected. Recommend a carbapenem as initial therapy.      Carbapenem resistance IMP NOT DETECTED NOT DETECTED Final   Carbapenem resistance KPC NOT DETECTED NOT DETECTED Final   Carbapenem resistance NDM NOT DETECTED NOT DETECTED Final   Carbapenem resist OXA 48 LIKE NOT DETECTED NOT DETECTED Final   Carbapenem resistance VIM NOT DETECTED NOT DETECTED Final    Comment: Performed at [facility], [address] [place], [place] [address]  Blood culture (routine x 2)     Status: Abnormal   Collection Time: 02/20/23  4:02 PM   Specimen: BLOOD  Result Value Ref Range Status   Specimen Description BLOOD SITE NOT SPECIFIED  Final   Special Requests   Final    BOTTLES DRAWN AEROBIC AND ANAEROBIC Blood Culture adequate volume   Culture  Setup Time   Final    GRAM NEGATIVE RODS IN BOTH AEROBIC AND ANAEROBIC BOTTLES CRITICAL VALUE NOTED.  VALUE IS CONSISTENT WITH PREVIOUSLY REPORTED AND CALLED VALUE.    Culture (A)  Final    ESCHERICHIA COLI SUSCEPTIBILITIES PERFORMED ON PREVIOUS CULTURE WITHIN THE LAST 5 DAYS. Performed at [facility], [address], [place], [place] [address]    Report Status 02/25/2023 FINAL  Final  MRSA Next Gen by PCR, Nasal     Status: None   Collection Time: 02/21/23  7:15 AM   Specimen: Nasal Mucosa; Nasal Swab  Result Value Ref Range Status   MRSA by PCR Next Gen NOT DETECTED NOT DETECTED Final    Comment: (NOTE) The GeneXpert MRSA Assay (FDA approved for NASAL specimens only), is one component of a comprehensive MRSA colonization surveillance program. It is not intended to diagnose MRSA infection nor to guide or monitor treatment for MRSA infections. Test performance is not FDA approved in patients less than 110 years old. Performed at [facility], [address] [place], [place] [address]   Culture, blood (Routine X 2) w Reflex to ID Panel     Status: None (Preliminary result)   Collection Time: 02/23/23  8:55 AM   Specimen: BLOOD RIGHT ARM  Result Value Ref Range Status   Specimen Description BLOOD RIGHT ARM  Final   Special Requests   Final    BOTTLES DRAWN AEROBIC AND ANAEROBIC Blood Culture results may not be optimal due to an inadequate volume of blood received in culture bottles   Culture   Final    NO GROWTH 3 DAYS Performed at [facility], [address] [place], [place] [address]    Report Status  PENDING  Incomplete  Culture, blood (Routine X 2) w Reflex to ID Panel     Status: None (Preliminary result)   Collection Time: 02/23/23  8:55 AM   Specimen: BLOOD LEFT HAND  Result Value Ref Range Status   Specimen Description BLOOD LEFT HAND  Final   Special Requests   Final    BOTTLES DRAWN AEROBIC AND ANAEROBIC Blood Culture results may not be optimal due to an inadequate volume of blood received in culture bottles   Culture   Final    NO GROWTH 3 DAYS Performed at [facility], [address], [place], [place] [address]    Report Status PENDING  Incomplete  Aerobic/Anaerobic Culture w Gram Stain (surgical/deep wound)     Status: None (Preliminary result)   Collection Time: 02/23/23  2:58 PM   Specimen: BILE  Result Value Ref Range Status   Specimen Description BILE  Final   Special Requests NONE  Final   Gram Stain   Final    NO WBC SEEN ABUNDANT GRAM NEGATIVE RODS Performed at [facility], [address], [place], [place] [address]    Culture   Final    ABUNDANT ESCHERICHIA COLI Confirmed Extended Spectrum Beta-Lactamase Producer (ESBL).  In bloodstream infections from ESBL organisms, carbapenems are preferred over piperacillin/tazobactam. They are shown to have a lower risk of mortality. NO ANAEROBES ISOLATED; CULTURE IN PROGRESS FOR 5 DAYS    Report Status PENDING  Incomplete   Organism ID, Bacteria ESCHERICHIA COLI  Final      Susceptibility   Escherichia coli - MIC*    AMPICILLIN >=32 RESISTANT Resistant     CEFEPIME 8 INTERMEDIATE Intermediate     CEFTAZIDIME RESISTANT Resistant     CEFTRIAXONE 32 RESISTANT Resistant     CIPROFLOXACIN >=4 RESISTANT Resistant     GENTAMICIN <=1 SENSITIVE Sensitive     IMIPENEM <=0.25 SENSITIVE Sensitive     TRIMETH/SULFA <=20 SENSITIVE Sensitive     AMPICILLIN/SULBACTAM 16 INTERMEDIATE Intermediate     PIP/TAZO 8 SENSITIVE Sensitive     * ABUNDANT ESCHERICHIA COLI         Radiology Studies: No results  found.      Scheduled Meds:  apixaban  5 mg Oral BID   Chlorhexidine Gluconate Cloth  6 each Topical Q0600   insulin aspart  0-15 Units Subcutaneous TID WC   insulin aspart  0-5 Units Subcutaneous QHS   levothyroxine  100 mcg Oral Q0600   midodrine  10 mg Oral Q8H   mouth rinse  15 mL Mouth Rinse 4 times per day   sodium chloride flush  5 mL Intracatheter Q8H   Continuous Infusions:  sodium chloride Stopped (02/23/23 2222)   sodium chloride     meropenem (MERREM) IV 1 g (  02/26/23 0829)     LOS: 6 days      Huey Bienenstock, MD [facility]

## 2023-02-27 ENCOUNTER — Encounter (INDEPENDENT_AMBULATORY_CARE_PROVIDER_SITE_OTHER): Payer: Medicare Other | Admitting: Ophthalmology

## 2023-02-27 DIAGNOSIS — R6521 Severe sepsis with septic shock: Secondary | ICD-10-CM | POA: Diagnosis not present

## 2023-02-27 DIAGNOSIS — H35033 Hypertensive retinopathy, bilateral: Secondary | ICD-10-CM

## 2023-02-27 DIAGNOSIS — I10 Essential (primary) hypertension: Secondary | ICD-10-CM

## 2023-02-27 DIAGNOSIS — H353221 Exudative age-related macular degeneration, left eye, with active choroidal neovascularization: Secondary | ICD-10-CM

## 2023-02-27 DIAGNOSIS — A419 Sepsis, unspecified organism: Secondary | ICD-10-CM | POA: Diagnosis not present

## 2023-02-27 DIAGNOSIS — Z961 Presence of intraocular lens: Secondary | ICD-10-CM

## 2023-02-27 DIAGNOSIS — H353111 Nonexudative age-related macular degeneration, right eye, early dry stage: Secondary | ICD-10-CM

## 2023-02-27 DIAGNOSIS — N1832 Chronic kidney disease, stage 3b: Secondary | ICD-10-CM | POA: Diagnosis not present

## 2023-02-27 DIAGNOSIS — I482 Chronic atrial fibrillation, unspecified: Secondary | ICD-10-CM | POA: Diagnosis not present

## 2023-02-27 LAB — CBC: Platelets: 130 10*3/uL — ABNORMAL LOW (ref 150–400)

## 2023-02-27 LAB — GLUCOSE, CAPILLARY
Glucose-Capillary: 95 mg/dL (ref 70–99)
Glucose-Capillary: 120 mg/dL — ABNORMAL HIGH (ref 70–99)
Glucose-Capillary: 138 mg/dL — ABNORMAL HIGH (ref 70–99)
Glucose-Capillary: 75 mg/dL (ref 70–99)

## 2023-02-27 MED ORDER — FUROSEMIDE 10 MG/ML IJ SOLN
20.0000 mg | Freq: Once | INTRAMUSCULAR | Status: AC
  Administered 2023-02-27: 20 mg via INTRAVENOUS
  Filled 2023-02-27: qty 2

## 2023-02-27 MED ORDER — MIDODRINE HCL 5 MG PO TABS
5.0000 mg | ORAL_TABLET | Freq: Two times a day (BID) | ORAL | Status: DC
  Administered 2023-02-27 – 2023-02-28 (×2): 5 mg via ORAL
  Filled 2023-02-27 (×2): qty 1

## 2023-02-27 NOTE — Plan of Care (Signed)
Problem: Education: Goal: Knowledge of General Education information will improve Description: Including pain rating scale, medication(s)/side effects and non-pharmacologic comfort measures 02/27/2023 2014 by Salome Spotted, RN Outcome: Progressing 02/27/2023 0616 by Salome Spotted, RN Outcome: Progressing   Problem: Health Behavior/Discharge Planning: Goal: Ability to manage health-related needs will improve 02/27/2023 2014 by Salome Spotted, RN Outcome: Progressing 02/27/2023 0616 by Salome Spotted, RN Outcome: Progressing   Problem: Clinical Measurements: Goal: Ability to maintain clinical measurements within normal limits will improve 02/27/2023 2014 by Salome Spotted, RN Outcome: Progressing 02/27/2023 0616 by Salome Spotted, RN Outcome: Progressing Goal: Will remain free from infection 02/27/2023 2014 by Salome Spotted, RN Outcome: Progressing 02/27/2023 0616 by Salome Spotted, RN Outcome: Progressing Goal: Diagnostic test results will improve 02/27/2023 2014 by Salome Spotted, RN Outcome: Progressing 02/27/2023 0616 by Salome Spotted, RN Outcome: Progressing Goal: Respiratory complications will improve 02/27/2023 2014 by Salome Spotted, RN Outcome: Progressing 02/27/2023 0616 by Salome Spotted, RN Outcome: Progressing Goal: Cardiovascular complication will be avoided 02/27/2023 2014 by Salome Spotted, RN Outcome: Progressing 02/27/2023 0616 by Salome Spotted, RN Outcome: Progressing   Problem: Activity: Goal: Risk for activity intolerance will decrease 02/27/2023 2014 by Salome Spotted, RN Outcome: Progressing 02/27/2023 0616 by Salome Spotted, RN Outcome: Progressing   Problem: Nutrition: Goal: Adequate nutrition will be maintained 02/27/2023 2014 by Salome Spotted, RN Outcome: Progressing 02/27/2023 0616 by Salome Spotted, RN Outcome: Progressing   Problem: Coping: Goal: Level of anxiety will decrease 02/27/2023 2014 by Salome Spotted, RN Outcome: Progressing 02/27/2023 0616 by Salome Spotted,  RN Outcome: Progressing   Problem: Elimination: Goal: Will not experience complications related to bowel motility 02/27/2023 2014 by Salome Spotted, RN Outcome: Progressing 02/27/2023 0616 by Salome Spotted, RN Outcome: Progressing Goal: Will not experience complications related to urinary retention 02/27/2023 2014 by Salome Spotted, RN Outcome: Progressing 02/27/2023 0616 by Salome Spotted, RN Outcome: Progressing   Problem: Pain Managment: Goal: General experience of comfort will improve 02/27/2023 2014 by Salome Spotted, RN Outcome: Progressing 02/27/2023 0616 by Salome Spotted, RN Outcome: Progressing   Problem: Safety: Goal: Ability to remain free from injury will improve 02/27/2023 2014 by Salome Spotted, RN Outcome: Progressing 02/27/2023 0616 by Salome Spotted, RN Outcome: Progressing   Problem: Skin Integrity: Goal: Risk for impaired skin integrity will decrease 02/27/2023 2014 by Salome Spotted, RN Outcome: Progressing 02/27/2023 0616 by Salome Spotted, RN Outcome: Progressing   Problem: Education: Goal: Ability to describe self-care measures that may prevent or decrease complications (Diabetes Survival Skills Education) will improve 02/27/2023 2014 by Salome Spotted, RN Outcome: Progressing 02/27/2023 0616 by Salome Spotted, RN Outcome: Progressing Goal: Individualized Educational Video(s) 02/27/2023 2014 by Salome Spotted, RN Outcome: Progressing 02/27/2023 0616 by Salome Spotted, RN Outcome: Progressing   Problem: Coping: Goal: Ability to adjust to condition or change in health will improve 02/27/2023 2014 by Salome Spotted, RN Outcome: Progressing 02/27/2023 0616 by Salome Spotted, RN Outcome: Progressing   Problem: Fluid Volume: Goal: Ability to maintain a balanced intake and output will improve 02/27/2023 2014 by Salome Spotted, RN Outcome: Progressing 02/27/2023 0616 by Salome Spotted, RN Outcome: Progressing   Problem: Health Behavior/Discharge Planning: Goal: Ability to identify and  utilize available resources and services will improve 02/27/2023 2014 by Salome Spotted, RN Outcome: Progressing 02/27/2023 0616 by Salome Spotted,  RN Outcome: Progressing Goal: Ability to manage health-related needs will improve 02/27/2023 2014 by Salome Spotted, RN Outcome: Progressing 02/27/2023 0616 by Salome Spotted, RN Outcome: Progressing   Problem: Metabolic: Goal: Ability to maintain appropriate glucose levels will improve 02/27/2023 2014 by Salome Spotted, RN Outcome: Progressing 02/27/2023 0616 by Salome Spotted, RN Outcome: Progressing   Problem: Nutritional: Goal: Maintenance of adequate nutrition will improve 02/27/2023 2014 by Salome Spotted, RN Outcome: Progressing 02/27/2023 0616 by Salome Spotted, RN Outcome: Progressing Goal: Progress toward achieving an optimal weight will improve 02/27/2023 2014 by Salome Spotted, RN Outcome: Progressing 02/27/2023 0616 by Salome Spotted, RN Outcome: Progressing   Problem: Skin Integrity: Goal: Risk for impaired skin integrity will decrease 02/27/2023 2014 by Salome Spotted, RN Outcome: Progressing 02/27/2023 0616 by Salome Spotted, RN Outcome: Progressing   Problem: Tissue Perfusion: Goal: Adequacy of tissue perfusion will improve 02/27/2023 2014 by Salome Spotted, RN Outcome: Progressing 02/27/2023 0616 by Salome Spotted, RN Outcome: Progressing   Problem: Education: Goal: Knowledge of General Education information will improve Description: Including pain rating scale, medication(s)/side effects and non-pharmacologic comfort measures 02/27/2023 2014 by Salome Spotted, RN Outcome: Progressing 02/27/2023 0616 by Salome Spotted, RN Outcome: Progressing   Problem: Health Behavior/Discharge Planning: Goal: Ability to manage health-related needs will improve 02/27/2023 2014 by Salome Spotted, RN Outcome: Progressing 02/27/2023 0616 by Salome Spotted, RN Outcome: Progressing   Problem: Clinical Measurements: Goal: Ability to maintain clinical measurements  within normal limits will improve 02/27/2023 2014 by Salome Spotted, RN Outcome: Progressing 02/27/2023 0616 by Salome Spotted, RN Outcome: Progressing   Problem: Clinical Measurements: Goal: Will remain free from infection 02/27/2023 2014 by Salome Spotted, RN Outcome: Progressing   Problem: Clinical Measurements: Goal: Will remain free from infection 02/27/2023 2014 by Salome Spotted, RN Outcome: Progressing 02/27/2023 0616 by Salome Spotted, RN Outcome: Progressing   Problem: Clinical Measurements: Goal: Respiratory complications will improve 02/27/2023 2014 by Salome Spotted, RN Outcome: Progressing 02/27/2023 0616 by Salome Spotted, RN Outcome: Progressing   Problem: Activity: Goal: Risk for activity intolerance will decrease 02/27/2023 2014 by Salome Spotted, RN Outcome: Progressing   Problem: Nutrition: Goal: Adequate nutrition will be maintained 02/27/2023 2014 by Salome Spotted, RN Outcome: Progressing 02/27/2023 0616 by Salome Spotted, RN Outcome: Progressing   Problem: Pain Managment: Goal: General experience of comfort will improve 02/27/2023 2014 by Salome Spotted, RN Outcome: Progressing 02/27/2023 0616 by Salome Spotted, RN Outcome: Progressing   Problem: Safety: Goal: Ability to remain free from injury will improve 02/27/2023 2014 by Salome Spotted, RN Outcome: Progressing 02/27/2023 0616 by Salome Spotted, RN Outcome: Progressing   Problem: Skin Integrity: Goal: Risk for impaired skin integrity will decrease 02/27/2023 2014 by Salome Spotted, RN Outcome: Progressing 02/27/2023 0616 by Salome Spotted, RN Outcome: Progressing   Problem: Education: Goal: Individualized Educational Video(s) 02/27/2023 2014 by Salome Spotted, RN Outcome: Progressing 02/27/2023 0616 by Salome Spotted, RN Outcome: Progressing

## 2023-02-27 NOTE — Progress Notes (Signed)
Physical Therapy Treatment Patient Details Name: Shane Diaz MRN: [id number] DOB: 06-04-1946 Today's Date: 02/27/2023   History of Present Illness The pt is a 77 yo male presents to [facility] hospital on 02/20/2023 with acutely worsening SOB and cough, hypotension and syncope. Pt noted to have gallbladder wall thickening on 7/20, underwent percutaneous cholecystostomy catheter placement on 7/22. PMH includes: morbid obesity, HTN, afib s/p cardioversion x2 in 2021, atrial flutter with AV block, obesity hypoventilation syndrome on 2L O2, OSA, chronic hypoxic resp failure, venous insufficiency, DVT.    PT Comments  Pt received in supine, pleasantly agreeable to therapy session and with good participation and tolerance for transfer training. Pt limited due to c/o BLE fatigue once standing EOB and needed bed height significantly elevated to achieve upright standing from EOB. He may benefit from orthostatic BP assessment next session with +2 staff for safety as pt unable to stand long enough for BP reading this date. Pt attempted to take pivotal steps with RW to chair but was unable to weight shift given fatigue so used Stedy from [employer] transfer. Discussed disposition with pt, he seemed agreeable to short term lower intensity post-acute rehab prior to DC home, Social worker notified, as currently he is needing +2 mod/maxA for some functional tasks. Pt continues to benefit from PT services to progress toward functional mobility goals.      Assistance Recommended at Discharge Frequent or constant Supervision/Assistance  If plan is discharge home, recommend the following:  Can travel by private vehicle    A lot of help with walking and/or transfers;A lot of help with bathing/dressing/bathroom;Assistance with cooking/housework;Assist for transportation;Help with stairs or ramp for entrance   No  Equipment Recommendations  Wheelchair (measurements PT);Wheelchair cushion (measurements PT);Other (comment)  (consider hospital bed with air mattress for home if he does not have one)    Recommendations for Other Services       Precautions / Restrictions Precautions Precautions: Fall Precaution Comments: drain RLQ; Needs [place] interpreter Restrictions Weight Bearing Restrictions: No     Mobility  Bed Mobility Overal bed mobility: Needs Assistance Bed Mobility: Rolling, Sidelying to Sit Rolling: Max assist Sidelying to sit: Max assist, HOB elevated       General bed mobility comments: toward his R side, increased time and effort, incrementally scooting legs toward EOB and then partial roll to reach cross-body to opposite rail, HOB elevated to assist with trunk raise.    Transfers Overall transfer level: Needs assistance Equipment used: Rolling walker (2 wheels), Ambulation equipment used Transfers: Sit to/from Stand, Bed to chair/wheelchair/BSC Sit to Stand: Max assist, From elevated surface, Mod assist, +2 safety/equipment           General transfer comment: from EOB<>RW with +1 maxA but pt unable to take pivotal steps from bed>chair. Pt then sat and PTA brought Stedy to room, and along with RN pt transferred from elevated bed>Stedy with +2 modA and Stedy seat>chair. Transfer via Lift Equipment: Stedy  Ambulation/Gait               General Gait Details: pt unable to weight shift to take steps when he attempted in [employer]             Wheelchair Mobility     Tilt Bed    Modified Rankin (Stroke Patients Only)       Balance Overall balance assessment: Needs assistance Sitting-balance support: Single extremity supported Sitting balance-Leahy Scale: Fair Sitting balance - Comments: pt using L rail for support  Standing balance support: Bilateral upper extremity supported, Reliant on assistive device for balance Standing balance-Leahy Scale: Poor Standing balance comment: heavy BUE reliance on RW, very limited standing tolerance due to c/o  fatigue                            Cognition Arousal/Alertness: Awake/alert Behavior During Therapy: WFL for tasks assessed/performed Overall Cognitive Status: Within Functional Limits for tasks assessed                                 General Comments: Pt AAOx4, demonstrates ability to follow 1 step commands, and was pleasant throughout session. Discussed post-acute plan and pt seemed receptive to short term skilled nursing, however want to have social work confirm as language may be slight barrier, pt seemed to understand. Pt defers [employer] Exercises - Lower Extremity Long Arc Quad: AROM, Both, 5 reps, Seated Other Exercises Other Exercises: supine BLE AROM: ankle pumps, hip abduction x10 reps ea Other Exercises: IS x 5 reps pt achieves ~1,000    General Comments General comments (skin integrity, edema, etc.): skin of distal BLE very mottled/dark in color; some areas of skin breakdown on his back, PTA cleaned his skin with damp rag but he would benefit from barrier ointment prior to return to supine.      Pertinent Vitals/Pain Pain Assessment Pain Assessment: No/denies pain Pain Intervention(s): Monitored during session, Repositioned    Home Living                          Prior Function            PT Goals (current goals can now be found in the care plan section) Acute Rehab PT Goals Patient Stated Goal: to return to independence PT Goal Formulation: With patient Time For Goal Achievement: 03/11/23 Progress towards PT goals: Progressing toward goals    Frequency    Min 1X/week      PT Plan Current plan remains appropriate    Co-evaluation              AM-PAC PT "6 Clicks" Mobility   Outcome Measure  Help needed turning from your back to your side while in a flat bed without using bedrails?: A Lot Help needed moving from lying on your back to sitting on the side of a flat bed without  using bedrails?: Total (from flat bed/no rail) Help needed moving to and from a bed to a chair (including a wheelchair)?: Total Help needed standing up from a chair using your arms (e.g., wheelchair or bedside chair)?: A Lot Help needed to walk in hospital room?: Total Help needed climbing 3-5 steps with a railing? : Total 6 Click Score: 8    End of Session Equipment Utilized During Treatment: Gait belt;Oxygen Activity Tolerance: Patient tolerated treatment well;Patient limited by fatigue Patient left: in chair;with call bell/phone within reach;with chair alarm set Nurse Communication: Mobility status;Need for lift equipment;Other (comment) Antony Salmon) PT Visit Diagnosis: Other abnormalities of gait and mobility (R26.89);Muscle weakness (generalized) (M62.81)     Time: 2440-1027 PT Time Calculation (min) (ACUTE ONLY): 48 min  Charges:    $Therapeutic Exercise: 8-22 mins $Therapeutic Activity: 23-37 mins PT General Charges $$ ACUTE PT VISIT: 1 Visit  Florina Ou., PTA Acute Rehabilitation Services Secure Chat Preferred 9a-5:30pm Office: [phone]    Dorathy Kinsman [facility] 02/27/2023, 5:28 PM

## 2023-02-27 NOTE — Plan of Care (Signed)
  Problem: Education: Goal: Knowledge of General Education information will improve Description: Including pain rating scale, medication(s)/side effects and non-pharmacologic comfort measures Outcome: Progressing   Problem: Health Behavior/Discharge Planning: Goal: Ability to manage health-related needs will improve Outcome: Progressing   Problem: Clinical Measurements: Goal: Ability to maintain clinical measurements within normal limits will improve Outcome: Progressing Goal: Will remain free from infection Outcome: Progressing Goal: Diagnostic test results will improve Outcome: Progressing Goal: Respiratory complications will improve Outcome: Progressing Goal: Cardiovascular complication will be avoided Outcome: Progressing   Problem: Activity: Goal: Risk for activity intolerance will decrease Outcome: Progressing   Problem: Nutrition: Goal: Adequate nutrition will be maintained Outcome: Progressing   Problem: Coping: Goal: Level of anxiety will decrease Outcome: Progressing   Problem: Elimination: Goal: Will not experience complications related to bowel motility Outcome: Progressing Goal: Will not experience complications related to urinary retention Outcome: Progressing   Problem: Pain Managment: Goal: General experience of comfort will improve Outcome: Progressing   Problem: Safety: Goal: Ability to remain free from injury will improve Outcome: Progressing   Problem: Skin Integrity: Goal: Risk for impaired skin integrity will decrease Outcome: Progressing   Problem: Education: Goal: Ability to describe self-care measures that may prevent or decrease complications (Diabetes Survival Skills Education) will improve Outcome: Progressing Goal: Individualized Educational Video(s) Outcome: Progressing   Problem: Coping: Goal: Ability to adjust to condition or change in health will improve Outcome: Progressing   Problem: Fluid Volume: Goal: Ability to  maintain a balanced intake and output will improve Outcome: Progressing   Problem: Health Behavior/Discharge Planning: Goal: Ability to identify and utilize available resources and services will improve Outcome: Progressing Goal: Ability to manage health-related needs will improve Outcome: Progressing   Problem: Metabolic: Goal: Ability to maintain appropriate glucose levels will improve Outcome: Progressing   Problem: Nutritional: Goal: Maintenance of adequate nutrition will improve Outcome: Progressing Goal: Progress toward achieving an optimal weight will improve Outcome: Progressing   Problem: Skin Integrity: Goal: Risk for impaired skin integrity will decrease Outcome: Progressing   Problem: Tissue Perfusion: Goal: Adequacy of tissue perfusion will improve Outcome: Progressing   Problem: Education: Goal: Knowledge of General Education information will improve Description: Including pain rating scale, medication(s)/side effects and non-pharmacologic comfort measures Outcome: Progressing   Problem: Health Behavior/Discharge Planning: Goal: Ability to manage health-related needs will improve Outcome: Progressing   Problem: Clinical Measurements: Goal: Ability to maintain clinical measurements within normal limits will improve Outcome: Progressing   Problem: Clinical Measurements: Goal: Will remain free from infection Outcome: Progressing   Problem: Clinical Measurements: Goal: Diagnostic test results will improve Outcome: Progressing   Problem: Clinical Measurements: Goal: Respiratory complications will improve Outcome: Progressing   Problem: Clinical Measurements: Goal: Cardiovascular complication will be avoided Outcome: Progressing   Problem: Elimination: Goal: Will not experience complications related to bowel motility Outcome: Progressing   Problem: Elimination: Goal: Will not experience complications related to urinary retention Outcome:  Progressing   Problem: Safety: Goal: Ability to remain free from injury will improve Outcome: Progressing   Problem: Pain Managment: Goal: General experience of comfort will improve Outcome: Progressing

## 2023-02-27 NOTE — Progress Notes (Signed)
   02/27/23 1635  Spiritual Encounters  Type of Visit Initial  Care provided to: Pt and family  Referral source Nurse (RN/NT/LPN)  Reason for visit Advance directives  OnCall Visit No  Spiritual Framework  Patient Stress Factors None identified  Family Stress Factors None identified  Interventions  Spiritual Care Interventions Made Compassionate presence;Reflective listening;Established relationship of care and support  Intervention Outcomes  Outcomes Awareness of support   Provided Adv. Dir. Education. Pt and daughter can speak [place] but wife speaks [place] Daughter of pt said that she saw someone drop a glove on the floor and pick it up so someone could reuse it. Daughter said that she understands hospital is short staff but it took an hour for someone to come one time to assist her father. I will attempt to complete the Adv. Dir Monday. Daughter will call the Chaplain phone if she can arrive before 3 pm Monday.  Chaplain Intern: Blair Promise

## 2023-02-27 NOTE — Plan of Care (Signed)
  Problem: Clinical Measurements: Goal: Ability to maintain clinical measurements within normal limits will improve Outcome: Progressing Goal: Diagnostic test results will improve Outcome: Progressing   Problem: Nutrition: Goal: Adequate nutrition will be maintained Outcome: Progressing   

## 2023-02-27 NOTE — Progress Notes (Signed)
PROGRESS NOTE    Shane Diaz  ZOX:[id number] DOB: 29-Jan-1946 DOA: 02/20/2023 PCP: Ralene Ok, MD    Brief Narrative:   77 year old gentleman with chronic diastolic heart failure, hypertension, CKD stage IIIb, atrial fibrillation on apixaban, chronic venous insufficiency, chronic respiratory failure on home oxygen, sleep apnea on CPAP presented to the ER on 7/19 with acutely worsening shortness of breath and cough.  He was recently admitted with heart failure exacerbation and was discharged home on 7/11 after diuresis.  He arrived to the emergency room on CPAP reported syncopal event at home the night prior.  Patient also reported chest pain, back pain, abdominal pain and confusion.  In the emergency room initially hypotensive, he was given 1 L bolus with minimal improvement, he was started on peripheral Levophed and admitted to ICU.  He was found to have E. coli bacteremia due to acute cholecystitis. 7/22, transfer out of ICU, respiratory decompensation while undergoing percutaneous cholecystostomy and back to ICU. 7/22, percutaneous cholecystostomy by IR. 7/24, blood pressures are adequate.  Transferring out to progressive care unit.   Assessment & Plan:   Septic shock secondary to E. coli bacteremia, acute cholecystitis. Weaned off vasopressors.  Sepsis physiology improved.  Blood pressures are adequate. Blood cultures 7/19, ESBL E. coli and currently on meropenem Blood culture 7/22, negative so far. 7/22, IR did subcutaneous cholecystostomy.  Surgical cultures with abundant E. coli./ESBL Surgery following.  Planning to manage conservatively now. Tolerating full liquid diet, will advance to soft diet today Continue with IV antibiotics, continue with IV MERREM, , stop dae 02/28/2023 -Will need drain study and return to general surgery office in 4 to 6 weeks  Acute on chronic respiratory failure with hypoxemia and hypercapnia: Significant component of sleep apnea and obesity  hypoventilation syndrome. Treated with BiPAP and now on CPAP. Continue BiPAP as needed and at nights, keep on oxygen to keep saturation more than 90%. He is net positive, creatinine appears to be improving, so will continue with gentle Diuresis  Chronic atrial fibrillation:  -No further procedures anticipated, transitioned from heparin GTT to Eliquis   Chronic venous stasis: Extremity Doppler negative for DVT.  Local wound care.  Hypothyroidism: On Synthroid.  Continue.  Acute kidney injury on chronic kidney disease stage IV: Recent known creatinine 1.7-1.8. Creatinine gradually plateauing.  Holding GDMT  -Monitor on gentle diuresis    DVT prophylaxis: SCDs Start: 02/20/23 1715 apixaban (ELIQUIS) tablet 5 mg   Code Status: Full code Family Communication: None, will update Disposition Plan: Status is: Inpatient Remains inpatient appropriate because: Severe systemic disease     Consultants:  General surgery Gastroenterology Critical care Interventional radiology  Procedures:  Percutaneous cholecystostomy 7/22  Antimicrobials:  Meropenem 7/20----   Subjective:  He id tolerating soft diet.  Objective: Vitals:   02/26/23 2336 02/27/23 0431 02/27/23 0834 02/27/23 1209  BP: 137/67 (!) 140/79 131/63 132/75  Pulse: 62  69 71  Resp: 15 18 (!) 23 17  Temp: 98.5 F (36.9 C) 97.6 F (36.4 C) 98.2 F (36.8 C) 98.8 F (37.1 C)  TempSrc: Oral Oral Oral Oral  SpO2: 96% 97%  96%  Weight:      Height:        Intake/Output Summary (Last 24 hours) at 02/27/2023 1226 Last data filed at 02/27/2023 1225 Gross per 24 hour  Intake 15 ml  Output 3335 ml  Net -3320 ml   Filed Weights   02/23/23 0440 02/24/23 0434 02/25/23 0354  Weight: 135.3 kg 135.1 kg 134.5 kg  Examination:  AAOX3, NAD, Normal affect frail CTA, but decreased ir entry at the bases., on 2 L oxygen Regular,No Gallops,Rubs or new Murmurs, No Parasternal Heave Abd soft, No tenderness bilious drainage  and percutaneous tube. +1 edema, B/L   Data Reviewed: I have personally reviewed following labs and imaging studies  CBC: Recent Labs  Lab 02/20/23 1500 02/20/23 1529 02/23/23 0439 02/23/23 1611 02/23/23 2145 02/23/23 2225 02/24/23 0306 02/24/23 0325 02/26/23 0103 02/27/23 0144  WBC 15.5*   < > 12.6*  --  15.1*  --   --  26.0* 7.6 6.0  NEUTROABS 13.9*  --   --   --   --   --   --   --  5.1  --   HGB 19.2*   < > 13.7   < > 13.9 15.3 15.3 14.2 13.2 14.3  HCT 59.3*   < > 43.3   < > 43.9 45.0 45.0 43.6 41.1 44.0  MCV 101.9*   < > 103.8*  --  103.1*  --   --  101.6* 102.2* 104.0*  PLT 230   < > 126*  --  113*  --   --  139* 130* 130*   < > = values in this interval not displayed.   Basic Metabolic Panel: Recent Labs  Lab 02/23/23 2145 02/23/23 2225 02/24/23 0306 02/24/23 0600 02/25/23 0217 02/26/23 0103 02/27/23 0144  NA 132*   < > 132* 135 133* 134* 135  K 3.0*   < > 6.2* 4.4 3.8 3.9 3.9  CL 91*  --   --  95* 93* 93* 92*  CO2 27  --   --  23 30 31  32  GLUCOSE 251*  --   --  148* 90 90 85  BUN 47*  --   --  50* 57* 56* 46*  CREATININE 2.35*  --   --  2.37* 2.32* 2.04* 1.51*  CALCIUM 8.5*  --   --  8.4* 8.6* 8.9 9.2  MG 1.9  --   --  2.1 2.0 2.2 1.9  PHOS 2.5  --   --  3.3 2.8 3.6 3.1   < > = values in this interval not displayed.   GFR: Estimated Creatinine Clearance: 54.9 mL/min (A) (by C-G formula based on SCr of 1.51 mg/dL (H)). Liver Function Tests: Recent Labs  Lab 02/20/23 1500 02/21/23 1045 02/22/23 0618 02/26/23 0103  AST 63* 39 27 32  ALT 32 25 21 19   ALKPHOS 129* 86 90 165*  BILITOT 2.6* 1.6* 1.1 0.7  PROT 9.6* 6.8 6.2* 6.0*  ALBUMIN 4.2 2.7* 2.4* 2.2*   Recent Labs  Lab 02/22/23 0618  LIPASE 24  AMYLASE 27*   Recent Labs  Lab 02/24/23 0612  AMMONIA 24   Coagulation Profile: Recent Labs  Lab 02/23/23 1109  INR 1.4*   Cardiac Enzymes: No results for input(s): "CKTOTAL", "CKMB", "CKMBINDEX", "TROPONINI" in the last 168 hours. BNP  (last 3 results) No results for input(s): "PROBNP" in the last 8760 hours. HbA1C: Recent Labs    02/25/23 0541  HGBA1C 6.2*   CBG: Recent Labs  Lab 02/26/23 1249 02/26/23 1749 02/26/23 2047 02/27/23 0855 02/27/23 1208  GLUCAP 129* 89 117* 95 120*   Lipid Profile: No results for input(s): "CHOL", "HDL", "LDLCALC", "TRIG", "CHOLHDL", "LDLDIRECT" in the last 72 hours. Thyroid Function Tests: No results for input(s): "TSH", "T4TOTAL", "FREET4", "T3FREE", "THYROIDAB" in the last 72 hours. Anemia Panel: No results for input(s): "VITAMINB12", "FOLATE", "FERRITIN", "TIBC", "  IRON", "RETICCTPCT" in the last 72 hours. Sepsis Labs: Recent Labs  Lab 02/20/23 1500 02/20/23 1553 02/20/23 2008 02/22/23 0618  PROCALCITON 2.19  --   --   --   LATICACIDVEN  --  2.5* 1.6 0.8    Recent Results (from the past 240 hour(s))  SARS Coronavirus 2 by RT PCR (hospital order, performed in [facility] hospital lab) *cepheid single result test*     Status: None   Collection Time: 02/20/23  3:30 PM   Specimen: Nasal Swab  Result Value Ref Range Status   SARS Coronavirus 2 by RT PCR NEGATIVE NEGATIVE Final    Comment: Performed at [facility], [address], [place], [place] [address]  Blood culture (routine x 2)     Status: Abnormal   Collection Time: 02/20/23  3:53 PM   Specimen: BLOOD  Result Value Ref Range Status   Specimen Description BLOOD SITE NOT SPECIFIED  Final   Special Requests   Final    BOTTLES DRAWN AEROBIC AND ANAEROBIC Blood Culture adequate volume   Culture  Setup Time   Final    GRAM NEGATIVE RODS IN BOTH AEROBIC AND ANAEROBIC BOTTLES CRITICAL RESULT CALLED TO, READ BACK BY AND VERIFIED WITH: J [facility]  02/21/23 MK Performed at [facility], [address] [place], [place] [address]    Culture (A)  Final    ESCHERICHIA COLI Confirmed Extended Spectrum Beta-Lactamase Producer (ESBL).  In bloodstream infections from ESBL organisms, carbapenems are  preferred over piperacillin/tazobactam. They are shown to have a lower risk of mortality.    Report Status 02/25/2023 FINAL  Final   Organism ID, Bacteria ESCHERICHIA COLI  Final      Susceptibility   Escherichia coli - MIC*    AMPICILLIN >=32 RESISTANT Resistant     CEFEPIME 1 SENSITIVE Sensitive     CEFTAZIDIME RESISTANT Resistant     CEFTRIAXONE 16 RESISTANT Resistant     CIPROFLOXACIN RESISTANT Resistant     GENTAMICIN <=1 SENSITIVE Sensitive     IMIPENEM <=0.25 SENSITIVE Sensitive     TRIMETH/SULFA <=20 SENSITIVE Sensitive     AMPICILLIN/SULBACTAM >=32 RESISTANT Resistant     PIP/TAZO 16 SENSITIVE Sensitive     * ESCHERICHIA COLI  Blood Culture ID Panel (Reflexed)     Status: Abnormal   Collection Time: 02/20/23  3:53 PM  Result Value Ref Range Status   Enterococcus faecalis NOT DETECTED NOT DETECTED Final   Enterococcus Faecium NOT DETECTED NOT DETECTED Final   Listeria monocytogenes NOT DETECTED NOT DETECTED Final   Staphylococcus species NOT DETECTED NOT DETECTED Final   Staphylococcus aureus (BCID) NOT DETECTED NOT DETECTED Final   Staphylococcus epidermidis NOT DETECTED NOT DETECTED Final   Staphylococcus lugdunensis NOT DETECTED NOT DETECTED Final   Streptococcus species NOT DETECTED NOT DETECTED Final   Streptococcus agalactiae NOT DETECTED NOT DETECTED Final   Streptococcus pneumoniae NOT DETECTED NOT DETECTED Final   Streptococcus pyogenes NOT DETECTED NOT DETECTED Final   A.calcoaceticus-baumannii NOT DETECTED NOT DETECTED Final   Bacteroides fragilis NOT DETECTED NOT DETECTED Final   Enterobacterales DETECTED (A) NOT DETECTED Final    Comment: Enterobacterales represent a large order of gram negative bacteria, not a single organism. CRITICAL RESULT CALLED TO, READ BACK BY AND VERIFIED WITH: J [facility]  02/21/23 MK    Enterobacter cloacae complex NOT DETECTED NOT DETECTED Final   Escherichia coli DETECTED (A) NOT DETECTED Final    Comment: CRITICAL  RESULT CALLED TO, READ BACK BY AND  VERIFIED WITH: J WYLAND,PHARMD@0350  02/21/23 MK    Klebsiella aerogenes NOT DETECTED NOT DETECTED Final   Klebsiella oxytoca NOT DETECTED NOT DETECTED Final   Klebsiella pneumoniae NOT DETECTED NOT DETECTED Final   Proteus species NOT DETECTED NOT DETECTED Final   Salmonella species NOT DETECTED NOT DETECTED Final   Serratia marcescens NOT DETECTED NOT DETECTED Final   Haemophilus influenzae NOT DETECTED NOT DETECTED Final   Neisseria meningitidis NOT DETECTED NOT DETECTED Final   Pseudomonas aeruginosa NOT DETECTED NOT DETECTED Final   Stenotrophomonas maltophilia NOT DETECTED NOT DETECTED Final   Candida albicans NOT DETECTED NOT DETECTED Final   Candida auris NOT DETECTED NOT DETECTED Final   Candida glabrata NOT DETECTED NOT DETECTED Final   Candida krusei NOT DETECTED NOT DETECTED Final   Candida parapsilosis NOT DETECTED NOT DETECTED Final   Candida tropicalis NOT DETECTED NOT DETECTED Final   Cryptococcus neoformans/gattii NOT DETECTED NOT DETECTED Final   CTX-M ESBL DETECTED (A) NOT DETECTED Final    Comment: CRITICAL RESULT CALLED TO, READ BACK BY AND VERIFIED WITH: J WYLAND,PHARMD@0350  02/21/23 MK (NOTE) Extended spectrum beta-lactamase detected. Recommend a carbapenem as initial therapy.      Carbapenem resistance IMP NOT DETECTED NOT DETECTED Final   Carbapenem resistance KPC NOT DETECTED NOT DETECTED Final   Carbapenem resistance NDM NOT DETECTED NOT DETECTED Final   Carbapenem resist OXA 48 LIKE NOT DETECTED NOT DETECTED Final   Carbapenem resistance VIM NOT DETECTED NOT DETECTED Final    Comment: Performed at [facility], [address] [place], [place] [address]  Blood culture (routine x 2)     Status: Abnormal   Collection Time: 02/20/23  4:02 PM   Specimen: BLOOD  Result Value Ref Range Status   Specimen Description BLOOD SITE NOT SPECIFIED  Final   Special Requests   Final    BOTTLES DRAWN AEROBIC AND ANAEROBIC  Blood Culture adequate volume   Culture  Setup Time   Final    GRAM NEGATIVE RODS IN BOTH AEROBIC AND ANAEROBIC BOTTLES CRITICAL VALUE NOTED.  VALUE IS CONSISTENT WITH PREVIOUSLY REPORTED AND CALLED VALUE.    Culture (A)  Final    ESCHERICHIA COLI SUSCEPTIBILITIES PERFORMED ON PREVIOUS CULTURE WITHIN THE LAST 5 DAYS. Performed at [facility], [address], [place], [place] [address]    Report Status 02/25/2023 FINAL  Final  MRSA Next Gen by PCR, Nasal     Status: None   Collection Time: 02/21/23  7:15 AM   Specimen: Nasal Mucosa; Nasal Swab  Result Value Ref Range Status   MRSA by PCR Next Gen NOT DETECTED NOT DETECTED Final    Comment: (NOTE) The GeneXpert MRSA Assay (FDA approved for NASAL specimens only), is one component of a comprehensive MRSA colonization surveillance program. It is not intended to diagnose MRSA infection nor to guide or monitor treatment for MRSA infections. Test performance is not FDA approved in patients less than 27 years old. Performed at [facility], [address] [place], [place] [address]   Culture, blood (Routine X 2) w Reflex to ID Panel     Status: None (Preliminary result)   Collection Time: 02/23/23  8:55 AM   Specimen: BLOOD RIGHT ARM  Result Value Ref Range Status   Specimen Description BLOOD RIGHT ARM  Final   Special Requests   Final    BOTTLES DRAWN AEROBIC AND ANAEROBIC Blood Culture results may not be optimal due to an inadequate volume of blood  received in culture bottles   Culture   Final    NO GROWTH 4 DAYS Performed at [facility], [address] [place], [place] [address]    Report Status PENDING  Incomplete  Culture, blood (Routine X 2) w Reflex to ID Panel     Status: None (Preliminary result)   Collection Time: 02/23/23  8:55 AM   Specimen: BLOOD LEFT HAND  Result Value Ref Range Status   Specimen Description BLOOD LEFT HAND  Final   Special Requests   Final    BOTTLES DRAWN AEROBIC AND ANAEROBIC  Blood Culture results may not be optimal due to an inadequate volume of blood received in culture bottles   Culture   Final    NO GROWTH 4 DAYS Performed at [facility], [address] [place], [place] [address]    Report Status PENDING  Incomplete  Aerobic/Anaerobic Culture w Gram Stain (surgical/deep wound)     Status: None (Preliminary result)   Collection Time: 02/23/23  2:58 PM   Specimen: BILE  Result Value Ref Range Status   Specimen Description BILE  Final   Special Requests NONE  Final   Gram Stain   Final    NO WBC SEEN ABUNDANT GRAM NEGATIVE RODS Performed at [facility], [address] [place], [place] [address]    Culture   Final    ABUNDANT ESCHERICHIA COLI Confirmed Extended Spectrum Beta-Lactamase Producer (ESBL).  In bloodstream infections from ESBL organisms, carbapenems are preferred over piperacillin/tazobactam. They are shown to have a lower risk of mortality. NO ANAEROBES ISOLATED; CULTURE IN PROGRESS FOR 5 DAYS    Report Status PENDING  Incomplete   Organism ID, Bacteria ESCHERICHIA COLI  Final      Susceptibility   Escherichia coli - MIC*    AMPICILLIN >=32 RESISTANT Resistant     CEFEPIME 8 INTERMEDIATE Intermediate     CEFTAZIDIME RESISTANT Resistant     CEFTRIAXONE 32 RESISTANT Resistant     CIPROFLOXACIN >=4 RESISTANT Resistant     GENTAMICIN <=1 SENSITIVE Sensitive     IMIPENEM <=0.25 SENSITIVE Sensitive     TRIMETH/SULFA <=20 SENSITIVE Sensitive     AMPICILLIN/SULBACTAM 16 INTERMEDIATE Intermediate     PIP/TAZO 8 SENSITIVE Sensitive     * ABUNDANT ESCHERICHIA COLI         Radiology Studies: No results found.      Scheduled Meds:  apixaban  5 mg Oral BID   Chlorhexidine Gluconate Cloth  6 each Topical Q0600   insulin aspart  0-15 Units Subcutaneous TID WC   insulin aspart  0-5 Units Subcutaneous QHS   levothyroxine  100 mcg Oral Q0600   midodrine  5 mg Oral BID WC   mouth rinse  15 mL Mouth Rinse 4 times per day    sodium chloride flush  5 mL Intracatheter Q8H   Continuous Infusions:  sodium chloride Stopped (02/23/23 2222)   sodium chloride     meropenem (MERREM) IV 1 g (02/27/23 0957)     LOS: 7 days      Huey Bienenstock, MD [facility]

## 2023-02-28 DIAGNOSIS — E039 Hypothyroidism, unspecified: Secondary | ICD-10-CM

## 2023-02-28 DIAGNOSIS — I482 Chronic atrial fibrillation, unspecified: Secondary | ICD-10-CM | POA: Diagnosis not present

## 2023-02-28 DIAGNOSIS — A419 Sepsis, unspecified organism: Secondary | ICD-10-CM | POA: Diagnosis not present

## 2023-02-28 DIAGNOSIS — J9601 Acute respiratory failure with hypoxia: Secondary | ICD-10-CM | POA: Diagnosis not present

## 2023-02-28 DIAGNOSIS — I5033 Acute on chronic diastolic (congestive) heart failure: Secondary | ICD-10-CM | POA: Diagnosis not present

## 2023-02-28 MED ORDER — ACETAMINOPHEN 325 MG PO TABS: 650.0000 mg | ORAL_TABLET | ORAL | Status: DC | PRN

## 2023-02-28 MED ORDER — BUMETANIDE 2 MG PO TABS: ORAL_TABLET | ORAL | Status: DC

## 2023-02-28 NOTE — Plan of Care (Signed)

## 2023-02-28 NOTE — TOC Transition Note (Signed)
Transition of Care [facility]) - CM/SW Discharge Note   Patient Details  Name: Shane Diaz MRN: [id number] Date of Birth: [birth year]  Transition of Care [facility] CM/SW Contact:  Lawerance Sabal, RN Phone Number: 02/28/2023, 9:20 AM   Clinical Narrative:     Notified [facility] of DC.  Spoke w patient, confirmed address, confirmed that there is oxygen at home.  Patient states that his wife is home to let him in.   Patient spoke clearly in [place]; No interpreter needed.   PTAR forms on chart. Discussed w bedside RN, PTAR called.   Final next level of care: Home w Home Health Services Barriers to Discharge: No Barriers Identified   Patient Goals and CMS Choice CMS Medicare.gov Compare Post Acute Care list provided to:: Patient Choice offered to / list presented to : Patient, Adult Children  Discharge Placement                         Discharge Plan and Services Additional resources added to the After Visit Summary for   In-house Referral: Clinical Social Work Discharge Planning Services: CM Consult Post Acute Care Choice: Home Health                      [name] Agency: [name] Date [facility] Agency Contacted: 02/28/23 Time HH Agency Contacted: [phone] Representative spoke with at [facility] Agency: [name]  Social Determinants of Health (SDOH) Interventions SDOH Screenings   Food Insecurity: No Food Insecurity (02/24/2023)  Housing: Low Risk  (02/24/2023)  Transportation Needs: No Transportation Needs (02/24/2023)  Utilities: Not At Risk (02/24/2023)  Alcohol Screen: Low Risk  (02/09/2023)  Financial Resource Strain: Low Risk  (02/09/2023)  Tobacco Use: Low Risk  (02/20/2023)     Readmission Risk Interventions     No data to display

## 2023-02-28 NOTE — Discharge Instructions (Signed)
Follow with Primary MD Ralene Ok, MD in 7 days   Get CBC, CMP, 2 view Chest X ray checked  by Primary MD next visit.    Activity: As tolerated with Full fall precautions use walker/cane & assistance as needed   Disposition Home    Diet: Heart Healthy , with feeding assistance and aspiration precautions.  For Heart failure patients - Check your Weight same time everyday, if you gain over 2 pounds, or you develop in leg swelling, experience more shortness of breath or chest pain, call your Primary MD immediately. Follow Cardiac Low Salt Diet and 1.5 lit/day fluid restriction.   On your next visit with your primary care physician please Get Medicines reviewed and adjusted.   Please request your Prim.MD to go over all Hospital Tests and Procedure/Radiological results at the follow up, please get all Hospital records sent to your Prim MD by signing hospital release before you go home.   If you experience worsening of your admission symptoms, develop shortness of breath, life threatening emergency, suicidal or homicidal thoughts you must seek medical attention immediately by calling 911 or calling your MD immediately  if symptoms less severe.  You Must read complete instructions/literature along with all the possible adverse reactions/side effects for all the Medicines you take and that have been prescribed to you. Take any new Medicines after you have completely understood and accpet all the possible adverse reactions/side effects.   Do not drive, operating heavy machinery, perform activities at heights, swimming or participation in water activities or provide baby sitting services if your were admitted for syncope or siezures until you have seen by Primary MD or a Neurologist and advised to do so again.  Do not drive when taking Pain medications.    Do not take more than prescribed Pain, Sleep and Anxiety Medications  Special Instructions: If you have smoked or chewed Tobacco  in the  last 2 yrs please stop smoking, stop any regular Alcohol  and or any Recreational drug use.  Wear Seat belts while driving.   Please note  You were cared for by a hospitalist during your hospital stay. If you have any questions about your discharge medications or the care you received while you were in the hospital after you are discharged, you can call the unit and asked to speak with the hospitalist on call if the hospitalist that took care of you is not available. Once you are discharged, your primary care physician will handle any further medical issues. Please note that NO REFILLS for any discharge medications will be authorized once you are discharged, as it is imperative that you return to your primary care physician (or establish a relationship with a primary care physician if you do not have one) for your aftercare needs so that they can reassess your need for medications and monitor your lab values.

## 2023-02-28 NOTE — Discharge Summary (Addendum)
Physician Discharge Summary  Shane Diaz LKG:[id number] DOB: 29-Nov-1945 DOA: 02/20/2023  PCP: Ralene Ok, MD  Admit date: 02/20/2023 Discharge date: 02/28/2023  Admitted From: (Home) Disposition:  (Home)  Recommendations for Outpatient Follow-up:  Follow up with PCP in 1-2 weeks Please obtain BMP/CBC in one week Patient to follow with IR as an outpatient, and to follow with general surgeon as an outpatient, appointment scheduled with Dr. Derrell Lolling 04/09/2023  Home Health: (YES)   Diet recommendation: Heart Healthy   Brief/Interim Summary:  77 year old gentleman with chronic diastolic heart failure, hypertension, CKD stage IIIb, atrial fibrillation on apixaban, chronic venous insufficiency, chronic respiratory failure on home oxygen, sleep apnea on CPAP presented to the ER on 7/19 with acutely worsening shortness of breath and cough.  He was recently admitted with heart failure exacerbation and was discharged home on 7/11 after diuresis.  He arrived to the emergency room on CPAP reported syncopal event at home the night prior.  Patient also reported chest pain, back pain, abdominal pain and confusion.  In the emergency room initially hypotensive, he was given 1 L bolus with minimal improvement, he was started on peripheral Levophed and admitted to ICU.  He was found to have E. coli bacteremia due to acute cholecystitis. 7/22, transfer out of ICU, respiratory decompensation while undergoing percutaneous cholecystostomy and back to ICU.  Respiratory status much improved, he is back to baseline   Septic shock secondary to E. coli bacteremia, acute cholecystitis. Admitted to ICU, requiring pressor support, weaned off vasopressors.  Sepsis physiology improved.  Blood pressures are adequate. Blood cultures 7/19, ESBL E. coli, he was treated with meropenem x 7 days.  Surveillance blood cultures obtained 7/22, remains negative at time of discharge.   7/22, IR did subcutaneous cholecystostomy.   Surgical cultures with abundant E. coli./ESBL Surgery following.  Planning to manage conservatively now.  Follow-up has been scheduled for 04/09/2023 with general surgery for definitive surgical management as an outpatient -Will need drain study and return to general surgery office in 4 to 6 weeks -Gust with IR prior to discharge, they will arrange for outpatient follow-up and care.   Acute on chronic respiratory failure with hypoxemia and hypercapnia: Significant component of sleep apnea and obesity hypoventilation syndrome. Required BiPAP treatment during hospital stay, currently back to baseline 2 L nasal cannula at time of discharge Improving with gentle diuresis, he is to resume his home diuresis on discharge   Chronic atrial fibrillation:  -No further procedures anticipated, transitioned from heparin GTT to Eliquis    Chronic venous stasis: Extremity Doppler negative for DVT.  Local wound care.   Hypothyroidism: On Synthroid.  Continue.   Acute kidney injury on chronic kidney disease stage IV: Recent known creatinine 1.7-1.8. Creatinine gradually plateauing.  Holding GDMT  -Monitor on gentle diuresis   Acute on chronic diastolic CHF -2D echo with a preserved EF, but grade 2 diastolic dysfunction, with evidence of volume overload, initial diuresis has been held due to AKI, but now renal function back to baseline, resume Bumex on discharge, patient was instructed to keep 2 mg oral daily x 5 days, then to go twice daily after that.    Obesity Body mass index is 45.25 kg/m.    Discharge Diagnoses:  Principal Problem:   Septic shock (HCC) Active Problems:   Acute on chronic diastolic CHF (congestive heart failure) (HCC)   Acute respiratory failure with hypoxia and hypercapnia (HCC)   Atrial fibrillation, chronic (HCC)   CKD stage 3b, GFR 30-44 ml/min (HCC)  Hypothyroidism   Obesity, Class III, BMI 40-49.9 (morbid obesity) (HCC)   OSA (obstructive sleep apnea)   Acute  cholecystitis   E coli bacteremia    Discharge Instructions  Discharge Instructions     Diet - low sodium heart healthy   Complete by: As directed    Discharge instructions   Complete by: As directed    Follow with Primary MD Ralene Ok, MD in 77 days   Get CBC, CMP, 2 view Chest X ray checked  by Primary MD next visit.    Activity: As tolerated with Full fall precautions use walker/cane & assistance as needed   Disposition Home    Diet: Heart Healthy , with feeding assistance and aspiration precautions.  For Heart failure patients - Check your Weight same time everyday, if you gain over 2 pounds, or you develop in leg swelling, experience more shortness of breath or chest pain, call your Primary MD immediately. Follow Cardiac Low Salt Diet and 1.5 lit/day fluid restriction.   On your next visit with your primary care physician please Get Medicines reviewed and adjusted.   Please request your Prim.MD to go over all Hospital Tests and Procedure/Radiological results at the follow up, please get all Hospital records sent to your Prim MD by signing hospital release before you go home.   If you experience worsening of your admission symptoms, develop shortness of breath, life threatening emergency, suicidal or homicidal thoughts you must seek medical attention immediately by calling 911 or calling your MD immediately  if symptoms less severe.  You Must read complete instructions/literature along with all the possible adverse reactions/side effects for all the Medicines you take and that have been prescribed to you. Take any new Medicines after you have completely understood and accpet all the possible adverse reactions/side effects.   Do not drive, operating heavy machinery, perform activities at heights, swimming or participation in water activities or provide baby sitting services if your were admitted for syncope or siezures until you have seen by Primary MD or a Neurologist and  advised to do so again.  Do not drive when taking Pain medications.    Do not take more than prescribed Pain, Sleep and Anxiety Medications  Special Instructions: If you have smoked or chewed Tobacco  in the last 2 yrs please stop smoking, stop any regular Alcohol  and or any Recreational drug use.  Wear Seat belts while driving.   Please note  You were cared for by a hospitalist during your hospital stay. If you have any questions about your discharge medications or the care you received while you were in the hospital after you are discharged, you can call the unit and asked to speak with the hospitalist on call if the hospitalist that took care of you is not available. Once you are discharged, your primary care physician will handle any further medical issues. Please note that NO REFILLS for any discharge medications will be authorized once you are discharged, as it is imperative that you return to your primary care physician (or establish a relationship with a primary care physician if you do not have one) for your aftercare needs so that they can reassess your need for medications and monitor your lab values.   Discharge wound care:   Complete by: As directed    Patient will need to flush drain QD with 5 cc NS, record output QD, dressing changes every 2-3 days or earlier if soiled.   Increase activity slowly  Complete by: As directed       Allergies as of 02/28/2023       Reactions   Diltiazem    rash   Amiodarone Rash   Amiodarone DC'd 02/07/2023 due to concern causing hypothyroidism.         Medication List     TAKE these medications    acetaminophen 325 MG tablet Commonly known as: TYLENOL Take 2 tablets (650 mg total) by mouth every 4 (four) hours as needed for fever, headache or mild pain.   apixaban 5 MG Tabs tablet Commonly known as: Eliquis Take 1 tablet (5 mg total) by mouth 2 (two) times daily.   bisacodyl 5 MG EC tablet Take 10 mg by mouth daily as needed  for moderate constipation.   bismuth subsalicylate 262 MG/15ML suspension Commonly known as: PEPTO BISMOL Take 30 mLs by mouth as needed for diarrhea or loose stools or indigestion.   bumetanide 2 MG tablet Commonly known as: BUMEX Please take 2 mg oral daily for 5 days, then go to home dose of 2 mg oral twice daily. What changed:  how much to take how to take this when to take this additional instructions   levothyroxine 100 MCG tablet Commonly known as: SYNTHROID Take 1 tablet (100 mcg total) by mouth daily at 6 (six) AM.   midodrine 5 MG tablet Commonly known as: PROAMATINE Take 1 tablet (5 mg total) by mouth 2 (two) times daily with a meal.   nitroGLYCERIN 0.4 MG SL tablet Commonly known as: NITROSTAT Place 1 tablet (0.4 mg total) under the tongue every 5 (five) minutes x 3 doses as needed for chest pain.               Discharge Care Instructions  (From admission, onward)           Start     Ordered   02/28/23 0000  Discharge wound care:       Comments: Patient will need to flush drain QD with 5 cc NS, record output QD, dressing changes every 2-3 days or earlier if soiled.   02/28/23 0850            Follow-up Information     Axel Filler, MD Follow up on 04/09/2023.   Specialty: General Surgery Why: 940am. Please bring a copy of your photo ID, insurance card and arrive 30 minutes prior to your appointment Contact information: [address] [place] [address] [phone]         Oley Balm, MD Follow up.   Specialties: Interventional Radiology, Radiology Why: Drain injection and exchange at [facility]. Our schedulers will call you to set up the appointment. Contact information: [address] [place] [address] [phone]                Allergies  Allergen Reactions   Diltiazem     rash   Amiodarone Rash    Amiodarone DC'd 02/07/2023 due to concern causing hypothyroidism.      Consultations: General surgery Gastroenterology Critical care Interventional radiology   Procedures/Studies: DG CHEST PORT 1 VIEW  Result Date: 02/23/2023 CLINICAL DATA:  Respiratory distress EXAM: PORTABLE CHEST 1 VIEW COMPARISON:  Film from earlier in the same day. FINDINGS: Stable cardiomegaly is noted. Aortic calcifications are seen. Lungs are well aerated bilaterally. Increasing basilar atelectasis is noted when compared with prior exam particularly in the left base. No bony abnormality is noted. IMPRESSION: Increasing basilar atelectasis particularly  on the left. Electronically Signed   By: Alcide Clever M.D.   On: 02/23/2023 23:29   DG CHEST PORT 1 VIEW  Result Date: 02/23/2023 CLINICAL DATA:  10272 Acute respiratory distress 66502 EXAM: PORTABLE CHEST - 1 VIEW COMPARISON:  02/20/2023 FINDINGS: No pneumothorax. Some worsening of patchy airspace opacities at the left lung base. Heart size and mediastinal contours are within normal limits. Aortic Atherosclerosis (ICD10-170.0). Probable small left pleural effusion. Visualized bones unremarkable. IMPRESSION: Worsening left basilar airspace disease with small effusion. Electronically Signed   By: Corlis Leak M.D.   On: 02/23/2023 16:53   IR BILIARY DRAIN PLACEMENT WITH CHOLANGIOGRAM  Result Date: 02/23/2023 CLINICAL DATA:  Clinical and imaging evidence of acute cholecystitis EXAM: PERCUTANEOUS CHOLECYSTOSTOMY TUBE PLACEMENT WITH ULTRASOUND AND FLUOROSCOPIC GUIDANCE FLUOROSCOPY: Radiation Exposure Index (as provided by the fluoroscopic device): 8 mGy air Kerma TECHNIQUE: The procedure, risks (including but not limited to bleeding, infection, organ damage ), benefits, and alternatives were explained to the patient. Questions regarding the procedure were encouraged and answered. The patient understands and consents to the procedure. Survey ultrasound of the abdomen was performed and an appropriate skin entry site was identified. Skin site was  marked, prepped with chlorhexidine, and draped in usual sterile fashion, and infiltrated locally with 1% lidocaine. Intravenous Fentanyl and Versed 1.5mg  were administered as conscious sedation during continuous monitoring of the patient's level of consciousness and physiological / cardiorespiratory status by the radiology RN, with a total moderate sedation time of 11 minutes. Under real-time ultrasound guidance, gallbladder was accessed using a transhepatic approach with a 21-gauge needle. Ultrasound image documentation was saved. Bile returned through the hub. Needle was exchanged over a 018 guidewire for transitional dilator which allowed placement of 035 J wire. Over this, a 10.2 French pigtail catheter was advanced and formed centrally in the gallbladder lumen. 50 mL of dark bile were aspirated, sample sent for Gram stain and culture. Small contrast injection confirmed appropriate position. Catheter secured externally with 0 Prolene suture and placed external drain bag. Patient tolerated the procedure well. COMPLICATIONS: COMPLICATIONS none IMPRESSION: 1. Technically successful percutaneous cholecystostomy tube placement with ultrasound and fluoroscopic guidance. Electronically Signed   By: Corlis Leak M.D.   On: 02/23/2023 16:51   VAS [place] LOWER EXTREMITY VENOUS (DVT)  Result Date: 02/21/2023  Lower Venous DVT Study Patient Name:  Shane Diaz  Date of Exam:   02/21/2023 Medical Rec #: [id number]      Accession #:    [id number] Date of Birth: Dec 04, 1945      Patient Gender: M Patient Age:   54 years Exam Location:  [facility] Procedure:      VAS [place] LOWER EXTREMITY VENOUS (DVT) Referring Phys: MURALI RAMASWAMY --------------------------------------------------------------------------------  Indications: Edema.  Risk Factors: HX of LE DVT, chronic venous insufficiency, varicose veins with complications,. Anticoagulation: Eliquis prior to admission & heparin during this admission. Comparison Study:  Previous exam on 09/25/21 was negative for DVT. Performing Technologist: Ernestene Mention RVT, RDMS  Examination Guidelines: A complete evaluation includes B-mode imaging, spectral Doppler, color Doppler, and power Doppler as needed of all accessible portions of each vessel. Bilateral testing is considered an integral part of a complete examination. Limited examinations for reoccurring indications may be performed as noted. The reflux portion of the exam is performed with the patient in reverse Trendelenburg.  +---------+---------------+---------+-----------+----------+-------------------+ RIGHT    CompressibilityPhasicitySpontaneityPropertiesThrombus Aging      +---------+---------------+---------+-----------+----------+-------------------+ CFV      Full  Yes      Yes                  Not well visualized +---------+---------------+---------+-----------+----------+-------------------+ SFJ                                                   Not visualized      +---------+---------------+---------+-----------+----------+-------------------+ FV Prox  Full           Yes      Yes                                      +---------+---------------+---------+-----------+----------+-------------------+ FV Mid   Full           Yes      Yes                                      +---------+---------------+---------+-----------+----------+-------------------+ FV DistalFull           Yes      Yes                                      +---------+---------------+---------+-----------+----------+-------------------+ PFV      Full                                                             +---------+---------------+---------+-----------+----------+-------------------+ POP      Full           Yes      Yes                                      +---------+---------------+---------+-----------+----------+-------------------+ PTV      Full                                         Not  well visualized +---------+---------------+---------+-----------+----------+-------------------+ PERO     Full                                         Not well visualized +---------+---------------+---------+-----------+----------+-------------------+ Difficulty visualizing CFV & SFJ due to line placement/bandage  Right Technical Findings: Multiphasic arterial flow noted from groin to ankle.  +---------+---------------+---------+-----------+----------+-------------------+ LEFT     CompressibilityPhasicitySpontaneityPropertiesThrombus Aging      +---------+---------------+---------+-----------+----------+-------------------+ CFV      Full           Yes      Yes                                      +---------+---------------+---------+-----------+----------+-------------------+ SFJ      Full                                                             +---------+---------------+---------+-----------+----------+-------------------+  FV Prox  Full           Yes      Yes                                      +---------+---------------+---------+-----------+----------+-------------------+ FV Mid   Full           Yes      Yes                                      +---------+---------------+---------+-----------+----------+-------------------+ FV DistalFull           Yes      Yes                                      +---------+---------------+---------+-----------+----------+-------------------+ PFV      Full                                                             +---------+---------------+---------+-----------+----------+-------------------+ POP      Partial        Yes      Yes                                      +---------+---------------+---------+-----------+----------+-------------------+ PTV      Full                                                             +---------+---------------+---------+-----------+----------+-------------------+ PERO      Full                                         Not well visualized +---------+---------------+---------+-----------+----------+-------------------+   Left Technical Findings: Multiphasic arterial flow noted from groin to ankle.   Summary: BILATERAL: -No evidence of popliteal cyst, bilaterally. RIGHT: - There is no evidence of deep vein thrombosis in the lower extremity. However, portions of this examination were limited- see technologist comments above.  LEFT: - There is no evidence of deep vein thrombosis in the lower extremity.  - Ultrasound characteristics of enlarged lymph nodes noted in the groin.  *See table(s) above for measurements and observations. Electronically signed by Sherald Hess MD on 02/21/2023 at 4:16:32 PM.    Final    US Abdomen Limited RUQ (LIVER/GB)  Result Date: 02/20/2023 CLINICAL DATA:  Cholecystitis. EXAM: ULTRASOUND ABDOMEN LIMITED RIGHT UPPER QUADRANT COMPARISON:  02/20/2023. FINDINGS: Gallbladder: Stones and sludge are present within the gallbladder. No gallbladder wall thickening by measurement. There is questionable mild pericholecystic edema. No sonographic Murphy sign noted by sonographer. Common bile duct: Diameter: 4.7 mm Liver: No focal lesion identified. The liver has a slightly nodular contour inferiorly suggesting underlying cirrhosis increased parenchymal echogenicity.  Portal vein is patent on color Doppler imaging with normal direction of blood flow towards the liver. Other: None. IMPRESSION: 1. Stones and sludge in the gallbladder with no gallbladder wall thickening. There is questionable pericholecystic edema. The sonographer reports no sonographic Murphy sign. Correlate clinically to exclude acute cholecystitis. 2. Hepatic steatosis. Electronically Signed   By: Thornell Sartorius M.D.   On: 02/20/2023 23:42   CT CHEST ABDOMEN PELVIS WO CONTRAST  Result Date: 02/20/2023 CLINICAL DATA:  Shortness of breath. Syncope. Chest pain. Sepsis suspected. EXAM: CT CHEST,  ABDOMEN AND PELVIS WITHOUT CONTRAST TECHNIQUE: Multidetector CT imaging of the chest, abdomen and pelvis was performed following the standard protocol without IV contrast. RADIATION DOSE REDUCTION: This exam was performed according to the departmental dose-optimization program which includes automated exposure control, adjustment of the mA and/or kV according to patient size and/or use of iterative reconstruction technique. COMPARISON:  CT angio chest from 03/18/2021 FINDINGS: CT CHEST FINDINGS Cardiovascular: Heart size and mediastinal contours are unremarkable. Aortic atherosclerosis and coronary artery calcifications. No pericardial effusion. Mediastinum/Nodes: Thyroid gland, trachea, and esophagus appear normal. No enlarged the mediastinal or axillary lymph nodes. Lungs/Pleura: Lungs are hypoinflated. No pleural effusion. Bilateral subpleural consolidation is identified within both lung bases. Subsegmental atelectasis noted within the lingula. No signs of interstitial edema. No pneumothorax. Musculoskeletal: Spondylosis within the thoracic spine. No acute or suspicious osseous findings. Similar to the previous exam there is bilateral chest wall areas of increased soft tissue. On the right this measures 6.6 x 2.9 by 5.4 cm, image 36/3 and image 34/6. This is not appreciably changed from 07/14/2020. CT ABDOMEN PELVIS FINDINGS Hepatobiliary: No focal liver abnormality identified. Gallbladder appears distended and is filled with multiple tiny stones clustered in the fundus. There is pericholecystic inflammatory fat stranding identified. Gallbladder wall thickness is suboptimally evaluated. No signs of bile duct dilatation. Pancreas: Unremarkable. No pancreatic ductal dilatation or surrounding inflammatory changes. Spleen: Normal in size without focal abnormality. Adrenals/Urinary Tract: Normal adrenal glands. Simple appearing cyst arises off the upper pole of the left kidney measuring 2.2 cm, image 64/3. No  follow-up imaging recommended. No nephrolithiasis or signs of obstructive uropathy. Urinary bladder appears normal. Stomach/Bowel: Stomach is nondistended. The appendix is surgically absent. There is a short segment of mild wall thickening adjacent to the gallbladder which may reflect secondary inflammation of the colon, image 80/3. No pathologic dilatation of the large or small bowel loops. Vascular/Lymphatic: Aortic atherosclerosis. No abdominal adenopathy. Borderline left common iliac lymph node measures 1 cm, image 98/3. Right common iliac node measures 0.9 cm. Reproductive: TURP defect identified within the prostate. Other: Fat containing bilateral inguinal hernias. No ascites or focal fluid collections. No signs of pneumoperitoneum. Musculoskeletal: No acute or significant osseous findings. IMPRESSION: 1. Gallbladder appears distended and is filled with multiple tiny stones clustered in the fundus. There is pericholecystic inflammatory fat stranding identified. Gallbladder wall thickness is suboptimally evaluated. Findings are concerning for acute cholecystitis. Consider further evaluation with right upper quadrant ultrasound. 2. Bilateral subpleural consolidation within both lung bases. This may represent atelectasis or pneumonia. 3. Short segment of mild wall thickening adjacent to the gallbladder may reflect secondary inflammation of the colon. 4. Coronary artery calcifications. 5. Fat containing bilateral inguinal hernias. 6.  Aortic Atherosclerosis (ICD10-I70.0). Electronically Signed   By: Signa Kell M.D.   On: 02/20/2023 18:40   [place] EKG SITE RITE  Result Date: 02/20/2023 If Site Rite image not attached, placement could not be confirmed due to current cardiac rhythm.  DG Chest Port 1 View  Result Date: 02/20/2023 CLINICAL DATA:  Shortness of breath. EXAM: PORTABLE CHEST 1 VIEW COMPARISON:  02/06/2023. FINDINGS: Small bilateral pleural effusions with adjacent left basilar atelectasis. Low lung  volumes accentuate the cardiomediastinal silhouette. No pneumothorax. IMPRESSION: Small bilateral pleural effusions with adjacent left basilar atelectasis. Electronically Signed   By: Orvan Falconer M.D.   On: 02/20/2023 15:13   ECHOCARDIOGRAM COMPLETE  Result Date: 02/08/2023    ECHOCARDIOGRAM REPORT   Patient Name:   Shane Diaz Date of Exam: 02/08/2023 Medical Rec #:  [id number]     Height:       68.0 in Accession #:    [id number]    Weight:       295.6 lb Date of Birth:  01-Aug-1946     BSA:          2.413 m Patient Age:    77 years      BP:           104/55 mmHg Patient Gender: M             HR:           56 bpm. Exam Location:  Inpatient Procedure: 2D Echo, Cardiac Doppler, Color Doppler and Intracardiac            Opacification Agent Indications:    CHF-Acute Diastolic I50.31  History:        Patient has prior history of Echocardiogram examinations, most                 recent 03/19/2021. CHF, Arrythmias:Atrial Fibrillation and Atrial                 Flutter; Risk Factors:Hypertension and Sleep Apnea. CKD, stage                 3.  Sonographer:    Lucendia Herrlich Referring Phys: [id number] SUBRINA SUNDIL  Sonographer Comments: Technically difficult study due to poor echo windows, suboptimal apical window, suboptimal subcostal window and patient is obese. IMPRESSIONS  1. Left ventricular ejection fraction, by estimation, is 65 to 70%. The left ventricle has normal function. The left ventricle has no regional wall motion abnormalities. There is mild asymmetric left ventricular hypertrophy of the basal segment. Left ventricular diastolic parameters are consistent with Grade II diastolic dysfunction (pseudonormalization).  2. Right ventricular systolic function is mildly reduced. The right ventricular size is normal. Tricuspid regurgitation signal is inadequate for assessing PA pressure.  3. Left atrial size was upper normal.  4. The mitral valve is grossly normal. Trivial mitral valve regurgitation.  5. The  aortic valve is tricuspid. There is mild calcification of the aortic valve. Aortic valve regurgitation is not visualized. Aortic valve sclerosis is present, with no evidence of aortic valve stenosis.  6. The inferior vena cava is normal in size with greater than 50% respiratory variability, suggesting right atrial pressure of 3 mmHg. Comparison(s): Prior images reviewed side by side. LVEF vigorous at 65-70% with moderate diastolic dysfunction. FINDINGS  Left Ventricle: Left ventricular ejection fraction, by estimation, is 65 to 70%. The left ventricle has normal function. The left ventricle has no regional wall motion abnormalities. Definity contrast agent was given IV to delineate the left ventricular  endocardial borders. The left ventricular internal cavity size was normal in size. There is mild asymmetric left ventricular hypertrophy of the basal segment. Left ventricular diastolic parameters are consistent with Grade II diastolic dysfunction (pseudonormalization). Right Ventricle: The right ventricular size is normal.  No increase in right ventricular wall thickness. Right ventricular systolic function is mildly reduced. Tricuspid regurgitation signal is inadequate for assessing PA pressure. The tricuspid regurgitant velocity is 0.81 m/s, and with an assumed right atrial pressure of 3 mmHg, the estimated right ventricular systolic pressure is 5.6 mmHg. Left Atrium: Left atrial size was upper normal. Right Atrium: Right atrial size was normal in size. Pericardium: There is no evidence of pericardial effusion. Presence of epicardial fat layer. Mitral Valve: The mitral valve is grossly normal. Trivial mitral valve regurgitation. Tricuspid Valve: The tricuspid valve is grossly normal. Tricuspid valve regurgitation is trivial. Aortic Valve: The aortic valve is tricuspid. There is mild calcification of the aortic valve. There is mild aortic valve annular calcification. Aortic valve regurgitation is not visualized.  Aortic valve sclerosis is present, with no evidence of aortic valve stenosis. Aortic valve peak gradient measures 5.2 mmHg. Pulmonic Valve: The pulmonic valve was grossly normal. Pulmonic valve regurgitation is trivial. Aorta: The aortic root is normal in size and structure. Venous: The inferior vena cava is normal in size with greater than 50% respiratory variability, suggesting right atrial pressure of 3 mmHg. IAS/Shunts: No atrial level shunt detected by color flow Doppler.  LEFT VENTRICLE PLAX 2D LVIDd:         4.90 cm   Diastology LVIDs:         3.40 cm   LV e' medial:    5.26 cm/s LV PW:         0.80 cm   LV E/e' medial:  13.3 LV IVS:        1.20 cm   LV e' lateral:   5.59 cm/s LVOT diam:     2.00 cm   LV E/e' lateral: 12.5 LV SV:         53 LV SV Index:   22 LVOT Area:     3.14 cm  RIGHT VENTRICLE            IVC RV S prime:     7.70 cm/s  IVC diam: 1.60 cm LEFT ATRIUM           Index        RIGHT ATRIUM           Index LA diam:      3.60 cm 1.49 cm/m   RA Area:     21.80 cm LA Vol (A4C): 79.9 ml 33.11 ml/m  RA Volume:   59.60 ml  24.70 ml/m  AORTIC VALVE AV Area (Vmax): 2.33 cm AV Vmax:        114.00 cm/s AV Peak Grad:   5.2 mmHg LVOT Vmax:      84.40 cm/s LVOT Vmean:     54.300 cm/s LVOT VTI:       0.169 m  AORTA Ao Root diam: 3.60 cm Ao Asc diam:  3.50 cm MITRAL VALVE               TRICUSPID VALVE MV Area (PHT): 2.91 cm    TR Peak grad:   2.6 mmHg MV Decel Time: 261 msec    TR Vmax:        80.80 cm/s MV E velocity: 70.10 cm/s MV A velocity: 53.90 cm/s  SHUNTS MV E/A ratio:  1.30        Systemic VTI:  0.17 m                            Systemic  Diam: 2.00 cm Nona Dell MD Electronically signed by Nona Dell MD Signature Date/Time: 02/08/2023/12:56:47 PM    Final    CT Head Wo Contrast  Result Date: 02/06/2023 CLINICAL DATA:  Mental status change, unknown cause EXAM: CT HEAD WITHOUT CONTRAST TECHNIQUE: Contiguous axial images were obtained from the base of the skull through the vertex without  intravenous contrast. RADIATION DOSE REDUCTION: This exam was performed according to the departmental dose-optimization program which includes automated exposure control, adjustment of the mA and/or kV according to patient size and/or use of iterative reconstruction technique. COMPARISON:  Head CT 09/24/2020 FINDINGS: Brain: No intracranial hemorrhage, mass effect, or midline shift. Age related atrophy. No hydrocephalus. The basilar cisterns are patent. Moderate periventricular and deep white matter hypodensity typical of chronic small vessel ischemic change. No evidence of territorial infarct or acute ischemia. No extra-axial or intracranial fluid collection. Vascular: Atherosclerosis of skullbase vasculature without hyperdense vessel or abnormal calcification. Skull: No fracture or focal lesion. Sinuses/Orbits: Bilateral cataract resection. Mucosal thickening throughout the left maxillary sinus is new from prior exam. Minimal opacification of lower left mastoid air cells. Other: None. IMPRESSION: 1. No acute intracranial abnormality. 2. 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 3. Left maxillary sinus mucosal thickening, new from prior exam. Recommend correlation for symptoms of sinusitis. Electronically Signed   By: Narda Rutherford M.D.   On: 02/06/2023 16:19   DG Chest 2 View  Result Date: 02/06/2023 CLINICAL DATA:  Shortness of breath EXAM: CHEST - 2 VIEW COMPARISON:  X-ray and CT angiogram 03/18/2021 FINDINGS: Under penetrated radiographs. Motion on lateral view as well. Enlarged cardiopericardial silhouette with vascular congestion and small effusions. No pneumothorax. No obvious consolidation but limited x-rays. Overlapping cardiac leads. IMPRESSION: Limited x-rays. Enlarged heart with vascular congestion and small effusions. Recommend follow-up Electronically Signed   By: Karen Kays M.D.   On: 02/06/2023 15:11      Subjective:  No significant events overnight, he denies any  complaints today, no nausea, no vomiting, Discharge Exam: Vitals:   02/28/23 0328 02/28/23 0800  BP: 103/62 120/66  Pulse: 64 68  Resp: 19 16  Temp: 97.8 F (36.6 C) 97.8 F (36.6 C)  SpO2: 94% 94%   Vitals:   02/27/23 2316 02/28/23 0328 02/28/23 0500 02/28/23 0800  BP: (!) 110/92 103/62  120/66  Pulse: 69 64  68  Resp: 20 19  16   Temp: 98 F (36.7 C) 97.8 F (36.6 C)  97.8 F (36.6 C)  TempSrc: Oral Oral  Oral  SpO2: 93% 94%  94%  Weight:   135 kg   Height:        General: Pt is alert, awake, not in acute distress Cardiovascular: RRR, S1/S2 +, no rubs, no gallops Respiratory: CTA bilaterally, no wheezing, no rhonchi Abdominal: Soft, NT, ND, bowel sounds +, right upper quadrant percutaneous drain with bilious output Extremities: no edema, no cyanosis, chronic lower extremity wounds bandaged    The results of significant diagnostics from this hospitalization (including imaging, microbiology, ancillary and laboratory) are listed below for reference.     Microbiology: Recent Results (from the past 240 hour(s))  SARS Coronavirus 2 by RT PCR (hospital order, performed in [facility] hospital lab) *cepheid single result test*     Status: None   Collection Time: 02/20/23  3:30 PM   Specimen: Nasal Swab  Result Value Ref Range Status   SARS Coronavirus 2 by RT PCR NEGATIVE NEGATIVE Final    Comment: Performed at [facility], [address] [place], [place] [address]  Blood culture (routine x 2)     Status: Abnormal   Collection Time: 02/20/23  3:53 PM   Specimen: BLOOD  Result Value Ref Range Status   Specimen Description BLOOD SITE NOT SPECIFIED  Final   Special Requests   Final    BOTTLES DRAWN AEROBIC AND ANAEROBIC Blood Culture adequate volume   Culture  Setup Time   Final    GRAM NEGATIVE RODS IN BOTH AEROBIC AND ANAEROBIC BOTTLES CRITICAL RESULT CALLED TO, READ BACK BY AND VERIFIED WITH: J [facility]  02/21/23 MK Performed at [facility]  Lab, [address] [place], [place] [address]    Culture (A)  Final    ESCHERICHIA COLI Confirmed Extended Spectrum Beta-Lactamase Producer (ESBL).  In bloodstream infections from ESBL organisms, carbapenems are preferred over piperacillin/tazobactam. They are shown to have a lower risk of mortality.    Report Status 02/25/2023 FINAL  Final   Organism ID, Bacteria ESCHERICHIA COLI  Final      Susceptibility   Escherichia coli - MIC*    AMPICILLIN >=32 RESISTANT Resistant     CEFEPIME 1 SENSITIVE Sensitive     CEFTAZIDIME RESISTANT Resistant     CEFTRIAXONE 16 RESISTANT Resistant     CIPROFLOXACIN RESISTANT Resistant     GENTAMICIN <=1 SENSITIVE Sensitive     IMIPENEM <=0.25 SENSITIVE Sensitive     TRIMETH/SULFA <=20 SENSITIVE Sensitive     AMPICILLIN/SULBACTAM >=32 RESISTANT Resistant     PIP/TAZO 16 SENSITIVE Sensitive     * ESCHERICHIA COLI  Blood Culture ID Panel (Reflexed)     Status: Abnormal   Collection Time: 02/20/23  3:53 PM  Result Value Ref Range Status   Enterococcus faecalis NOT DETECTED NOT DETECTED Final   Enterococcus Faecium NOT DETECTED NOT DETECTED Final   Listeria monocytogenes NOT DETECTED NOT DETECTED Final   Staphylococcus species NOT DETECTED NOT DETECTED Final   Staphylococcus aureus (BCID) NOT DETECTED NOT DETECTED Final   Staphylococcus epidermidis NOT DETECTED NOT DETECTED Final   Staphylococcus lugdunensis NOT DETECTED NOT DETECTED Final   Streptococcus species NOT DETECTED NOT DETECTED Final   Streptococcus agalactiae NOT DETECTED NOT DETECTED Final   Streptococcus pneumoniae NOT DETECTED NOT DETECTED Final   Streptococcus pyogenes NOT DETECTED NOT DETECTED Final   A.calcoaceticus-baumannii NOT DETECTED NOT DETECTED Final   Bacteroides fragilis NOT DETECTED NOT DETECTED Final   Enterobacterales DETECTED (A) NOT DETECTED Final    Comment: Enterobacterales represent a large order of gram negative bacteria, not a single organism. CRITICAL RESULT CALLED  TO, READ BACK BY AND VERIFIED WITH: J WYLAND,PHARMD@[id number]  02/21/23 MK    Enterobacter cloacae complex NOT DETECTED NOT DETECTED Final   Escherichia coli DETECTED (A) NOT DETECTED Final    Comment: CRITICAL RESULT CALLED TO, READ BACK BY AND VERIFIED WITH: J [facility]  02/21/23 MK    Klebsiella aerogenes NOT DETECTED NOT DETECTED Final   Klebsiella oxytoca NOT DETECTED NOT DETECTED Final   Klebsiella pneumoniae NOT DETECTED NOT DETECTED Final   Proteus species NOT DETECTED NOT DETECTED Final   Salmonella species NOT DETECTED NOT DETECTED Final   Serratia marcescens NOT DETECTED NOT DETECTED Final   Haemophilus influenzae NOT DETECTED NOT DETECTED Final   Neisseria meningitidis NOT DETECTED NOT DETECTED Final   Pseudomonas aeruginosa NOT DETECTED NOT DETECTED Final   Stenotrophomonas maltophilia NOT DETECTED NOT DETECTED Final   Candida albicans NOT DETECTED NOT DETECTED Final   Candida auris NOT DETECTED NOT  DETECTED Final   Candida glabrata NOT DETECTED NOT DETECTED Final   Candida krusei NOT DETECTED NOT DETECTED Final   Candida parapsilosis NOT DETECTED NOT DETECTED Final   Candida tropicalis NOT DETECTED NOT DETECTED Final   Cryptococcus neoformans/gattii NOT DETECTED NOT DETECTED Final   CTX-M ESBL DETECTED (A) NOT DETECTED Final    Comment: CRITICAL RESULT CALLED TO, READ BACK BY AND VERIFIED WITH: J WYLAND,PHARMD@0350  02/21/23 MK (NOTE) Extended spectrum beta-lactamase detected. Recommend a carbapenem as initial therapy.      Carbapenem resistance IMP NOT DETECTED NOT DETECTED Final   Carbapenem resistance KPC NOT DETECTED NOT DETECTED Final   Carbapenem resistance NDM NOT DETECTED NOT DETECTED Final   Carbapenem resist OXA 48 LIKE NOT DETECTED NOT DETECTED Final   Carbapenem resistance VIM NOT DETECTED NOT DETECTED Final    Comment: Performed at [facility], [address], [place], [place] [address]  Blood culture (routine x 2)     Status: Abnormal    Collection Time: 02/20/23  4:02 PM   Specimen: BLOOD  Result Value Ref Range Status   Specimen Description BLOOD SITE NOT SPECIFIED  Final   Special Requests   Final    BOTTLES DRAWN AEROBIC AND ANAEROBIC Blood Culture adequate volume   Culture  Setup Time   Final    GRAM NEGATIVE RODS IN BOTH AEROBIC AND ANAEROBIC BOTTLES CRITICAL VALUE NOTED.  VALUE IS CONSISTENT WITH PREVIOUSLY REPORTED AND CALLED VALUE.    Culture (A)  Final    ESCHERICHIA COLI SUSCEPTIBILITIES PERFORMED ON PREVIOUS CULTURE WITHIN THE LAST 5 DAYS. Performed at [facility], [address] [place], [place] [address]    Report Status 02/25/2023 FINAL  Final  MRSA Next Gen by PCR, Nasal     Status: None   Collection Time: 02/21/23  7:15 AM   Specimen: Nasal Mucosa; Nasal Swab  Result Value Ref Range Status   MRSA by PCR Next Gen NOT DETECTED NOT DETECTED Final    Comment: (NOTE) The GeneXpert MRSA Assay (FDA approved for NASAL specimens only), is one component of a comprehensive MRSA colonization surveillance program. It is not intended to diagnose MRSA infection nor to guide or monitor treatment for MRSA infections. Test performance is not FDA approved in patients less than 37 years old. Performed at [facility], [address], [place], [place] [address]   Culture, blood (Routine X 2) w Reflex to ID Panel     Status: None (Preliminary result)   Collection Time: 02/23/23  8:55 AM   Specimen: BLOOD RIGHT ARM  Result Value Ref Range Status   Specimen Description BLOOD RIGHT ARM  Final   Special Requests   Final    BOTTLES DRAWN AEROBIC AND ANAEROBIC Blood Culture results may not be optimal due to an inadequate volume of blood received in culture bottles   Culture   Final    NO GROWTH 4 DAYS Performed at [facility], [address] [place], [place] [address]    Report Status PENDING  Incomplete  Culture, blood (Routine X 2) w Reflex to ID Panel     Status: None (Preliminary result)    Collection Time: 02/23/23  8:55 AM   Specimen: BLOOD LEFT HAND  Result Value Ref Range Status   Specimen Description BLOOD LEFT HAND  Final   Special Requests   Final    BOTTLES DRAWN AEROBIC AND ANAEROBIC Blood Culture results may not be optimal due to an inadequate  volume of blood received in culture bottles   Culture   Final    NO GROWTH 4 DAYS Performed at [facility], [address] [place], [place] [address]    Report Status PENDING  Incomplete  Aerobic/Anaerobic Culture w Gram Stain (surgical/deep wound)     Status: None (Preliminary result)   Collection Time: 02/23/23  2:58 PM   Specimen: BILE  Result Value Ref Range Status   Specimen Description BILE  Final   Special Requests NONE  Final   Gram Stain   Final    NO WBC SEEN ABUNDANT GRAM NEGATIVE RODS Performed at [facility], [address], [place], [place] [address]    Culture   Final    ABUNDANT ESCHERICHIA COLI Confirmed Extended Spectrum Beta-Lactamase Producer (ESBL).  In bloodstream infections from ESBL organisms, carbapenems are preferred over piperacillin/tazobactam. They are shown to have a lower risk of mortality. NO ANAEROBES ISOLATED; CULTURE IN PROGRESS FOR 5 DAYS    Report Status PENDING  Incomplete   Organism ID, Bacteria ESCHERICHIA COLI  Final      Susceptibility   Escherichia coli - MIC*    AMPICILLIN >=32 RESISTANT Resistant     CEFEPIME 8 INTERMEDIATE Intermediate     CEFTAZIDIME RESISTANT Resistant     CEFTRIAXONE 32 RESISTANT Resistant     CIPROFLOXACIN >=4 RESISTANT Resistant     GENTAMICIN <=1 SENSITIVE Sensitive     IMIPENEM <=0.25 SENSITIVE Sensitive     TRIMETH/SULFA <=20 SENSITIVE Sensitive     AMPICILLIN/SULBACTAM 16 INTERMEDIATE Intermediate     PIP/TAZO 8 SENSITIVE Sensitive     * ABUNDANT ESCHERICHIA COLI     Labs: BNP (last 3 results) Recent Labs    02/06/23 1705 02/20/23 1500 02/23/23 0855  BNP 174.1* 187.5* 134.6*   Basic Metabolic Panel: Recent Labs   Lab 02/23/23 2145 02/23/23 2225 02/24/23 0600 02/25/23 0217 02/26/23 0103 02/27/23 0144 02/28/23 0103  NA 132*   < > 135 133* 134* 135 135  K 3.0*   < > 4.4 3.8 3.9 3.9 3.7  CL 91*  --  95* 93* 93* 92* 92*  CO2 27  --  23 30 31  32 33*  GLUCOSE 251*  --  148* 90 90 85 97  BUN 47*  --  50* 57* 56* 46* 33*  CREATININE 2.35*  --  2.37* 2.32* 2.04* 1.51* 1.24  CALCIUM 8.5*  --  8.4* 8.6* 8.9 9.2 9.0  MG 1.9  --  2.1 2.0 2.2 1.9  --   PHOS 2.5  --  3.3 2.8 3.6 3.1  --    < > = values in this interval not displayed.   Liver Function Tests: Recent Labs  Lab 02/21/23 1045 02/22/23 0618 02/26/23 0103  AST 39 27 32  ALT 25 21 19   ALKPHOS 86 90 165*  BILITOT 1.6* 1.1 0.7  PROT 6.8 6.2* 6.0*  ALBUMIN 2.7* 2.4* 2.2*   Recent Labs  Lab 02/22/23 0618  LIPASE 24  AMYLASE 27*   Recent Labs  Lab 02/24/23 0612  AMMONIA 24   CBC: Recent Labs  Lab 02/23/23 0439 02/23/23 1611 02/23/23 2145 02/23/23 2225 02/24/23 0306 02/24/23 0325 02/26/23 0103 02/27/23 0144  WBC 12.6*  --  15.1*  --   --  26.0* 7.6 6.0  NEUTROABS  --   --   --   --   --   --  5.1  --   HGB 13.7   < >  13.9 15.3 15.3 14.2 13.2 14.3  HCT 43.3   < > 43.9 45.0 45.0 43.6 41.1 44.0  MCV 103.8*  --  103.1*  --   --  101.6* 102.2* 104.0*  PLT 126*  --  113*  --   --  139* 130* 130*   < > = values in this interval not displayed.   Cardiac Enzymes: No results for input(s): "CKTOTAL", "CKMB", "CKMBINDEX", "TROPONINI" in the last 168 hours. BNP: Invalid input(s): "POCBNP" CBG: Recent Labs  Lab 02/26/23 2047 02/27/23 0855 02/27/23 1208 02/27/23 1615 02/27/23 2102  GLUCAP 117* 95 120* 138* 75   D-Dimer No results for input(s): "DDIMER" in the last 72 hours. Hgb A1c No results for input(s): "HGBA1C" in the last 72 hours. Lipid Profile No results for input(s): "CHOL", "HDL", "LDLCALC", "TRIG", "CHOLHDL", "LDLDIRECT" in the last 72 hours. Thyroid function studies No results for input(s): "TSH",  "T4TOTAL", "T3FREE", "THYROIDAB" in the last 72 hours.  Invalid input(s): "FREET3" Anemia work up No results for input(s): "VITAMINB12", "FOLATE", "FERRITIN", "TIBC", "IRON", "RETICCTPCT" in the last 72 hours. Urinalysis    Component Value Date/Time   COLORURINE YELLOW 09/24/2020 1354   APPEARANCEUR CLEAR 09/24/2020 1354   LABSPEC 1.014 09/24/2020 1354   PHURINE 5.0 09/24/2020 1354   GLUCOSEU NEGATIVE 09/24/2020 1354   HGBUR SMALL (A) 09/24/2020 1354   BILIRUBINUR NEGATIVE 09/24/2020 1354   KETONESUR NEGATIVE 09/24/2020 1354   PROTEINUR NEGATIVE 09/24/2020 1354   NITRITE NEGATIVE 09/24/2020 1354   LEUKOCYTESUR NEGATIVE 09/24/2020 1354   Sepsis Labs Recent Labs  Lab 02/23/23 2145 02/24/23 0325 02/26/23 0103 02/27/23 0144  WBC 15.1* 26.0* 7.6 6.0   Microbiology Recent Results (from the past 240 hour(s))  SARS Coronavirus 2 by RT PCR (hospital order, performed in [facility] hospital lab) *cepheid single result test*     Status: None   Collection Time: 02/20/23  3:30 PM   Specimen: Nasal Swab  Result Value Ref Range Status   SARS Coronavirus 2 by RT PCR NEGATIVE NEGATIVE Final    Comment: Performed at [facility], [address] [place], [place] [address]  Blood culture (routine x 2)     Status: Abnormal   Collection Time: 02/20/23  3:53 PM   Specimen: BLOOD  Result Value Ref Range Status   Specimen Description BLOOD SITE NOT SPECIFIED  Final   Special Requests   Final    BOTTLES DRAWN AEROBIC AND ANAEROBIC Blood Culture adequate volume   Culture  Setup Time   Final    GRAM NEGATIVE RODS IN BOTH AEROBIC AND ANAEROBIC BOTTLES CRITICAL RESULT CALLED TO, READ BACK BY AND VERIFIED WITH: J WYLAND,PHARMD@0350  02/21/23 MK Performed at [facility], [address] [place], [place] [address]    Culture (A)  Final    ESCHERICHIA COLI Confirmed Extended Spectrum Beta-Lactamase Producer (ESBL).  In bloodstream infections from ESBL organisms, carbapenems are  preferred over piperacillin/tazobactam. They are shown to have a lower risk of mortality.    Report Status 02/25/2023 FINAL  Final   Organism ID, Bacteria ESCHERICHIA COLI  Final      Susceptibility   Escherichia coli - MIC*    AMPICILLIN >=32 RESISTANT Resistant     CEFEPIME 1 SENSITIVE Sensitive     CEFTAZIDIME RESISTANT Resistant     CEFTRIAXONE 16 RESISTANT Resistant     CIPROFLOXACIN RESISTANT Resistant     GENTAMICIN <=1 SENSITIVE Sensitive     IMIPENEM <=0.25 SENSITIVE Sensitive     TRIMETH/SULFA <=  20 SENSITIVE Sensitive     AMPICILLIN/SULBACTAM >=32 RESISTANT Resistant     PIP/TAZO 16 SENSITIVE Sensitive     * ESCHERICHIA COLI  Blood Culture ID Panel (Reflexed)     Status: Abnormal   Collection Time: 02/20/23  3:53 PM  Result Value Ref Range Status   Enterococcus faecalis NOT DETECTED NOT DETECTED Final   Enterococcus Faecium NOT DETECTED NOT DETECTED Final   Listeria monocytogenes NOT DETECTED NOT DETECTED Final   Staphylococcus species NOT DETECTED NOT DETECTED Final   Staphylococcus aureus (BCID) NOT DETECTED NOT DETECTED Final   Staphylococcus epidermidis NOT DETECTED NOT DETECTED Final   Staphylococcus lugdunensis NOT DETECTED NOT DETECTED Final   Streptococcus species NOT DETECTED NOT DETECTED Final   Streptococcus agalactiae NOT DETECTED NOT DETECTED Final   Streptococcus pneumoniae NOT DETECTED NOT DETECTED Final   Streptococcus pyogenes NOT DETECTED NOT DETECTED Final   A.calcoaceticus-baumannii NOT DETECTED NOT DETECTED Final   Bacteroides fragilis NOT DETECTED NOT DETECTED Final   Enterobacterales DETECTED (A) NOT DETECTED Final    Comment: Enterobacterales represent a large order of gram negative bacteria, not a single organism. CRITICAL RESULT CALLED TO, READ BACK BY AND VERIFIED WITH: J WYLAND,PHARMD@0350  02/21/23 MK    Enterobacter cloacae complex NOT DETECTED NOT DETECTED Final   Escherichia coli DETECTED (A) NOT DETECTED Final    Comment: CRITICAL  RESULT CALLED TO, READ BACK BY AND VERIFIED WITH: J WYLAND,PHARMD@0350  02/21/23 MK    Klebsiella aerogenes NOT DETECTED NOT DETECTED Final   Klebsiella oxytoca NOT DETECTED NOT DETECTED Final   Klebsiella pneumoniae NOT DETECTED NOT DETECTED Final   Proteus species NOT DETECTED NOT DETECTED Final   Salmonella species NOT DETECTED NOT DETECTED Final   Serratia marcescens NOT DETECTED NOT DETECTED Final   Haemophilus influenzae NOT DETECTED NOT DETECTED Final   Neisseria meningitidis NOT DETECTED NOT DETECTED Final   Pseudomonas aeruginosa NOT DETECTED NOT DETECTED Final   Stenotrophomonas maltophilia NOT DETECTED NOT DETECTED Final   Candida albicans NOT DETECTED NOT DETECTED Final   Candida auris NOT DETECTED NOT DETECTED Final   Candida glabrata NOT DETECTED NOT DETECTED Final   Candida krusei NOT DETECTED NOT DETECTED Final   Candida parapsilosis NOT DETECTED NOT DETECTED Final   Candida tropicalis NOT DETECTED NOT DETECTED Final   Cryptococcus neoformans/gattii NOT DETECTED NOT DETECTED Final   CTX-M ESBL DETECTED (A) NOT DETECTED Final    Comment: CRITICAL RESULT CALLED TO, READ BACK BY AND VERIFIED WITH: J WYLAND,PHARMD@0350  02/21/23 MK (NOTE) Extended spectrum beta-lactamase detected. Recommend a carbapenem as initial therapy.      Carbapenem resistance IMP NOT DETECTED NOT DETECTED Final   Carbapenem resistance KPC NOT DETECTED NOT DETECTED Final   Carbapenem resistance NDM NOT DETECTED NOT DETECTED Final   Carbapenem resist OXA 48 LIKE NOT DETECTED NOT DETECTED Final   Carbapenem resistance VIM NOT DETECTED NOT DETECTED Final    Comment: Performed at [facility], [address] [place], [place] [address]  Blood culture (routine x 2)     Status: Abnormal   Collection Time: 02/20/23  4:02 PM   Specimen: BLOOD  Result Value Ref Range Status   Specimen Description BLOOD SITE NOT SPECIFIED  Final   Special Requests   Final    BOTTLES DRAWN AEROBIC AND ANAEROBIC  Blood Culture adequate volume   Culture  Setup Time   Final    GRAM NEGATIVE RODS IN BOTH AEROBIC AND ANAEROBIC BOTTLES CRITICAL VALUE NOTED.  VALUE IS CONSISTENT WITH  PREVIOUSLY REPORTED AND CALLED VALUE.    Culture (A)  Final    ESCHERICHIA COLI SUSCEPTIBILITIES PERFORMED ON PREVIOUS CULTURE WITHIN THE LAST 5 DAYS. Performed at [facility], [address] [place], [place] [address]    Report Status 02/25/2023 FINAL  Final  MRSA Next Gen by PCR, Nasal     Status: None   Collection Time: 02/21/23  7:15 AM   Specimen: Nasal Mucosa; Nasal Swab  Result Value Ref Range Status   MRSA by PCR Next Gen NOT DETECTED NOT DETECTED Final    Comment: (NOTE) The GeneXpert MRSA Assay (FDA approved for NASAL specimens only), is one component of a comprehensive MRSA colonization surveillance program. It is not intended to diagnose MRSA infection nor to guide or monitor treatment for MRSA infections. Test performance is not FDA approved in patients less than 23 years old. Performed at [facility], [address], [place], [place] [address]   Culture, blood (Routine X 2) w Reflex to ID Panel     Status: None (Preliminary result)   Collection Time: 02/23/23  8:55 AM   Specimen: BLOOD RIGHT ARM  Result Value Ref Range Status   Specimen Description BLOOD RIGHT ARM  Final   Special Requests   Final    BOTTLES DRAWN AEROBIC AND ANAEROBIC Blood Culture results may not be optimal due to an inadequate volume of blood received in culture bottles   Culture   Final    NO GROWTH 4 DAYS Performed at [facility], [address] [place], [place] [address]    Report Status PENDING  Incomplete  Culture, blood (Routine X 2) w Reflex to ID Panel     Status: None (Preliminary result)   Collection Time: 02/23/23  8:55 AM   Specimen: BLOOD LEFT HAND  Result Value Ref Range Status   Specimen Description BLOOD LEFT HAND  Final   Special Requests   Final    BOTTLES DRAWN AEROBIC AND ANAEROBIC  Blood Culture results may not be optimal due to an inadequate volume of blood received in culture bottles   Culture   Final    NO GROWTH 4 DAYS Performed at [facility], [address] [place], [place] [address]    Report Status PENDING  Incomplete  Aerobic/Anaerobic Culture w Gram Stain (surgical/deep wound)     Status: None (Preliminary result)   Collection Time: 02/23/23  2:58 PM   Specimen: BILE  Result Value Ref Range Status   Specimen Description BILE  Final   Special Requests NONE  Final   Gram Stain   Final    NO WBC SEEN ABUNDANT GRAM NEGATIVE RODS Performed at [facility], [address] [place], [place] [address]    Culture   Final    ABUNDANT ESCHERICHIA COLI Confirmed Extended Spectrum Beta-Lactamase Producer (ESBL).  In bloodstream infections from ESBL organisms, carbapenems are preferred over piperacillin/tazobactam. They are shown to have a lower risk of mortality. NO ANAEROBES ISOLATED; CULTURE IN PROGRESS FOR 5 DAYS    Report Status PENDING  Incomplete   Organism ID, Bacteria ESCHERICHIA COLI  Final      Susceptibility   Escherichia coli - MIC*    AMPICILLIN >=32 RESISTANT Resistant     CEFEPIME 8 INTERMEDIATE Intermediate     CEFTAZIDIME RESISTANT Resistant     CEFTRIAXONE 32 RESISTANT Resistant     CIPROFLOXACIN >=4 RESISTANT Resistant     GENTAMICIN <=1 SENSITIVE Sensitive  IMIPENEM <=0.25 SENSITIVE Sensitive     TRIMETH/SULFA <=20 SENSITIVE Sensitive     AMPICILLIN/SULBACTAM 16 INTERMEDIATE Intermediate     PIP/TAZO 8 SENSITIVE Sensitive     * ABUNDANT ESCHERICHIA COLI     Time coordinating discharge: Over 30 minutes  SIGNED:   Huey Bienenstock, MD  [facility] 02/28/2023, 10:18 AM Pager   If 7PM-7AM, please contact night-coverage [email or web address] Password [email or web address]

## 2023-03-03 ENCOUNTER — Telehealth (HOSPITAL_COMMUNITY): Payer: Self-pay

## 2023-03-03 NOTE — Telephone Encounter (Signed)
Called to confirm Heart & Vascular Transitions of Care appointment. Patient reminded to bring all medications and pill box organizer with them. Confirmed patient has transportation. Gave directions, instructed to utilize valet parking.  Patient stated he does not want to come to appointment. Cancelled appointment.

## 2023-03-04 ENCOUNTER — Encounter (HOSPITAL_COMMUNITY): Payer: Medicare Other

## 2023-03-13 ENCOUNTER — Ambulatory Visit: Payer: Medicare Other | Admitting: Cardiovascular Disease

## 2023-03-18 ENCOUNTER — Encounter (HOSPITAL_COMMUNITY): Payer: Medicare Other

## 2023-04-21 ENCOUNTER — Other Ambulatory Visit (HOSPITAL_COMMUNITY): Payer: Self-pay | Admitting: Student

## 2023-04-21 ENCOUNTER — Ambulatory Visit (HOSPITAL_COMMUNITY)
Admission: RE | Admit: 2023-04-21 | Discharge: 2023-04-21 | Disposition: A | Discharge status: Home / Self Care | Payer: Medicare Other | Source: Ambulatory Visit | Attending: Student

## 2023-04-21 DIAGNOSIS — K810 Acute cholecystitis: Secondary | ICD-10-CM | POA: Diagnosis not present

## 2023-04-21 DIAGNOSIS — R0602 Shortness of breath: Secondary | ICD-10-CM

## 2023-04-21 DIAGNOSIS — Z434 Encounter for attention to other artificial openings of digestive tract: Secondary | ICD-10-CM | POA: Insufficient documentation

## 2023-04-21 HISTORY — PX: IR EXCHANGE BILIARY DRAIN: IMG6046

## 2023-04-21 MED ORDER — LIDOCAINE-EPINEPHRINE 2 %-1:100000 IJ SOLN
20.0000 mL | Freq: Once | INTRAMUSCULAR | Status: DC
  Filled 2023-04-21: qty 20

## 2023-04-21 MED ORDER — SODIUM CHLORIDE 0.9% FLUSH: 5.0000 mL | Freq: Three times a day (TID) | INTRAVENOUS | Status: DC

## 2023-04-21 MED ORDER — LIDOCAINE HCL 1 % IJ SOLN
INTRAMUSCULAR | Status: AC
  Filled 2023-04-21: qty 20

## 2023-04-21 MED ORDER — IOHEXOL 300 MG/ML  SOLN
50.0000 mL | Freq: Once | INTRAMUSCULAR | Status: AC | PRN
  Administered 2023-04-21: 10 mL

## 2023-04-21 NOTE — Procedures (Signed)
Vascular and Interventional Radiology Procedure Note  Patient: Shane Diaz DOB: 04-17-1946 Medical Record Number: [id number] Note Date/Time: 04/21/23 11:19 AM   Performing Physician: Roanna Banning, MD Assistant(s): None  Diagnosis: Hx of acute cholecystitis. Drain placed 02/23/23  Procedure:  CHOLECYSTOSTOMY TUBE EXCHANGE ANTEROGRADE CHOLANGIOGRAM  Anesthesia: Local Anesthetic Complications: None Estimated Blood Loss:  0 mL Specimens:  None  Findings:  Successful exchange for a new 17F cholecystostomy tube. Patent cystic duct  Plan:OK for clamping trial if / when cleared by General Surgery Follow up for routine tube evaluation in 6 week(s).   See detailed procedure note with images in PACS. The patient tolerated the procedure well without incident or complication and was returned to Recovery in stable condition.    Roanna Banning, MD Vascular and Interventional Radiology Specialists [facility] Radiology   Pager. [phone] Clinic. [phone]

## 2023-05-26 ENCOUNTER — Other Ambulatory Visit: Payer: Self-pay | Admitting: Cardiovascular Disease

## 2023-06-02 ENCOUNTER — Ambulatory Visit (HOSPITAL_COMMUNITY)
Admission: RE | Admit: 2023-06-02 | Discharge: 2023-06-02 | Disposition: A | Discharge status: Home / Self Care | Payer: Medicare Other | Source: Ambulatory Visit | Attending: Interventional Radiology | Admitting: Interventional Radiology

## 2023-06-02 ENCOUNTER — Other Ambulatory Visit (HOSPITAL_COMMUNITY): Payer: Self-pay | Admitting: Interventional Radiology

## 2023-06-02 DIAGNOSIS — K819 Cholecystitis, unspecified: Secondary | ICD-10-CM

## 2023-06-02 DIAGNOSIS — Z434 Encounter for attention to other artificial openings of digestive tract: Secondary | ICD-10-CM | POA: Diagnosis present

## 2023-06-02 DIAGNOSIS — K810 Acute cholecystitis: Secondary | ICD-10-CM | POA: Insufficient documentation

## 2023-06-02 HISTORY — PX: IR EXCHANGE BILIARY DRAIN: IMG6046

## 2023-06-02 MED ORDER — LIDOCAINE-EPINEPHRINE 1 %-1:100000 IJ SOLN
20.0000 mL | Freq: Once | INTRAMUSCULAR | Status: AC
  Administered 2023-06-02: 10 mL via INTRADERMAL

## 2023-06-02 MED ORDER — LIDOCAINE-EPINEPHRINE 1 %-1:100000 IJ SOLN
INTRAMUSCULAR | Status: AC
  Filled 2023-06-02: qty 1

## 2023-06-02 MED ORDER — IOHEXOL 300 MG/ML  SOLN
50.0000 mL | Freq: Once | INTRAMUSCULAR | Status: AC | PRN
  Administered 2023-06-02: 15 mL

## 2023-06-02 NOTE — Procedures (Signed)
Vascular and Interventional Radiology Procedure Note  Patient: Shane Diaz DOB: November 09, 1945 Medical Record Number: [id number] Note Date/Time: 06/02/23 1:02 PM   Performing Physician: Roanna Banning, MD Assistant(s): None  Diagnosis: Hx of acute cholecystitis. Drain placed 02/23/23   Procedure:  CHOLECYSTOSTOMY TUBE EXCHANGE ANTEROGRADE CHOLANGIOGRAM  Anesthesia: Local Anesthetic Complications: None Estimated Blood Loss:  0 mL Specimens:  None  Findings:  Successful exchange for a new 38F cholecystostomy tube. Patent cystic duct  Plan: General Surgery follow up recommended for gallbladder plan. Pt was placed again on a capping trial, and a drainage bag was given as a precaution.  See detailed procedure note with images in PACS. The patient tolerated the procedure well without incident or complication and was returned to Recovery in stable condition.    Roanna Banning, MD Vascular and Interventional Radiology Specialists [facility] Radiology   Pager. [phone] Clinic. [phone]

## 2023-06-30 ENCOUNTER — Encounter (HOSPITAL_COMMUNITY): Payer: Self-pay

## 2023-06-30 ENCOUNTER — Emergency Department (HOSPITAL_COMMUNITY): Payer: Medicare Other

## 2023-06-30 ENCOUNTER — Inpatient Hospital Stay (HOSPITAL_COMMUNITY): Payer: Medicare Other

## 2023-06-30 ENCOUNTER — Other Ambulatory Visit: Payer: Self-pay

## 2023-06-30 ENCOUNTER — Inpatient Hospital Stay (HOSPITAL_COMMUNITY)
Admission: EM | Admit: 2023-06-30 | Discharge: 2023-07-05 | DRG: 871 | Disposition: E | Payer: Medicare Other | Attending: Pulmonary Disease | Admitting: Pulmonary Disease

## 2023-06-30 DIAGNOSIS — I110 Hypertensive heart disease with heart failure: Secondary | ICD-10-CM | POA: Diagnosis present

## 2023-06-30 DIAGNOSIS — Z66 Do not resuscitate: Secondary | ICD-10-CM | POA: Diagnosis present

## 2023-06-30 DIAGNOSIS — Z7401 Bed confinement status: Secondary | ICD-10-CM | POA: Diagnosis not present

## 2023-06-30 DIAGNOSIS — Z1152 Encounter for screening for COVID-19: Secondary | ICD-10-CM | POA: Diagnosis not present

## 2023-06-30 DIAGNOSIS — I468 Cardiac arrest due to other underlying condition: Secondary | ICD-10-CM | POA: Diagnosis present

## 2023-06-30 DIAGNOSIS — J9622 Acute and chronic respiratory failure with hypercapnia: Secondary | ICD-10-CM | POA: Diagnosis present

## 2023-06-30 DIAGNOSIS — J9601 Acute respiratory failure with hypoxia: Secondary | ICD-10-CM | POA: Diagnosis not present

## 2023-06-30 DIAGNOSIS — I469 Cardiac arrest, cause unspecified: Secondary | ICD-10-CM | POA: Diagnosis not present

## 2023-06-30 DIAGNOSIS — K760 Fatty (change of) liver, not elsewhere classified: Secondary | ICD-10-CM | POA: Diagnosis present

## 2023-06-30 DIAGNOSIS — I4891 Unspecified atrial fibrillation: Secondary | ICD-10-CM

## 2023-06-30 DIAGNOSIS — A4159 Other Gram-negative sepsis: Secondary | ICD-10-CM | POA: Diagnosis present

## 2023-06-30 DIAGNOSIS — E872 Acidosis, unspecified: Secondary | ICD-10-CM | POA: Diagnosis present

## 2023-06-30 DIAGNOSIS — R34 Anuria and oliguria: Secondary | ICD-10-CM | POA: Diagnosis present

## 2023-06-30 DIAGNOSIS — J9691 Respiratory failure, unspecified with hypoxia: Secondary | ICD-10-CM | POA: Diagnosis present

## 2023-06-30 DIAGNOSIS — I5032 Chronic diastolic (congestive) heart failure: Secondary | ICD-10-CM | POA: Diagnosis present

## 2023-06-30 DIAGNOSIS — E785 Hyperlipidemia, unspecified: Secondary | ICD-10-CM | POA: Diagnosis present

## 2023-06-30 DIAGNOSIS — R6521 Severe sepsis with septic shock: Secondary | ICD-10-CM | POA: Diagnosis present

## 2023-06-30 DIAGNOSIS — Z79890 Hormone replacement therapy (postmenopausal): Secondary | ICD-10-CM | POA: Diagnosis not present

## 2023-06-30 DIAGNOSIS — A419 Sepsis, unspecified organism: Secondary | ICD-10-CM | POA: Diagnosis present

## 2023-06-30 DIAGNOSIS — E876 Hypokalemia: Secondary | ICD-10-CM | POA: Diagnosis present

## 2023-06-30 DIAGNOSIS — J9621 Acute and chronic respiratory failure with hypoxia: Secondary | ICD-10-CM | POA: Diagnosis present

## 2023-06-30 DIAGNOSIS — R579 Shock, unspecified: Secondary | ICD-10-CM | POA: Diagnosis not present

## 2023-06-30 DIAGNOSIS — I878 Other specified disorders of veins: Secondary | ICD-10-CM | POA: Diagnosis present

## 2023-06-30 DIAGNOSIS — I482 Chronic atrial fibrillation, unspecified: Secondary | ICD-10-CM | POA: Diagnosis present

## 2023-06-30 DIAGNOSIS — E662 Morbid (severe) obesity with alveolar hypoventilation: Secondary | ICD-10-CM | POA: Diagnosis present

## 2023-06-30 DIAGNOSIS — Z7901 Long term (current) use of anticoagulants: Secondary | ICD-10-CM

## 2023-06-30 DIAGNOSIS — Z79899 Other long term (current) drug therapy: Secondary | ICD-10-CM

## 2023-06-30 DIAGNOSIS — Z515 Encounter for palliative care: Secondary | ICD-10-CM

## 2023-06-30 DIAGNOSIS — K810 Acute cholecystitis: Secondary | ICD-10-CM | POA: Diagnosis present

## 2023-06-30 DIAGNOSIS — Z6841 Body Mass Index (BMI) 40.0 and over, adult: Secondary | ICD-10-CM | POA: Diagnosis not present

## 2023-06-30 DIAGNOSIS — N179 Acute kidney failure, unspecified: Secondary | ICD-10-CM | POA: Diagnosis present

## 2023-06-30 LAB — CBC WITH DIFFERENTIAL/PLATELET
Abs Immature Granulocytes: 0 10*3/uL (ref 0.00–0.07)
Basophils Absolute: 0 10*3/uL (ref 0.0–0.1)
Basophils Relative: 0 %
Eosinophils Absolute: 0 10*3/uL (ref 0.0–0.5)
Eosinophils Relative: 0 %
HCT: 25.9 % — ABNORMAL LOW (ref 39.0–52.0)
Hemoglobin: 7.7 g/dL — ABNORMAL LOW (ref 13.0–17.0)
Lymphocytes Relative: 3 %
Lymphs Abs: 0.7 10*3/uL (ref 0.7–4.0)
MCH: 31.4 pg (ref 26.0–34.0)
MCHC: 29.7 g/dL — ABNORMAL LOW (ref 30.0–36.0)
MCV: 105.7 fL — ABNORMAL HIGH (ref 80.0–100.0)
Monocytes Absolute: 0.5 10*3/uL (ref 0.1–1.0)
Monocytes Relative: 2 %
Neutro Abs: 23.2 10*3/uL — ABNORMAL HIGH (ref 1.7–7.7)
Neutrophils Relative %: 95 %
Platelets: 147 10*3/uL — ABNORMAL LOW (ref 150–400)
RBC: 2.45 MIL/uL — ABNORMAL LOW (ref 4.22–5.81)
RDW: 14.4 % (ref 11.5–15.5)
WBC: 24.4 10*3/uL — ABNORMAL HIGH (ref 4.0–10.5)
nRBC: 0 /100{WBCs}
nRBC: 0.2 % (ref 0.0–0.2)

## 2023-06-30 LAB — COMPREHENSIVE METABOLIC PANEL
ALT: 23 U/L (ref 0–44)
AST: 63 U/L — ABNORMAL HIGH (ref 15–41)
Albumin: 1.7 g/dL — ABNORMAL LOW (ref 3.5–5.0)
Alkaline Phosphatase: 58 U/L (ref 38–126)
Anion gap: 12 (ref 5–15)
BUN: 21 mg/dL (ref 8–23)
CO2: 16 mmol/L — ABNORMAL LOW (ref 22–32)
Calcium: 6.2 mg/dL — CL (ref 8.9–10.3)
Chloride: 114 mmol/L — ABNORMAL HIGH (ref 98–111)
Creatinine, Ser: 2.04 mg/dL — ABNORMAL HIGH (ref 0.61–1.24)
GFR, Estimated: 33 mL/min — ABNORMAL LOW (ref >60)
Glucose, Bld: 171 mg/dL — ABNORMAL HIGH (ref 70–99)
Potassium: 2.7 mmol/L — CL (ref 3.5–5.1)
Sodium: 142 mmol/L (ref 135–145)
Total Bilirubin: 0.6 mg/dL (ref <1.2)
Total Protein: 4.3 g/dL — ABNORMAL LOW (ref 6.5–8.1)

## 2023-06-30 LAB — TYPE AND SCREEN
ABO/RH(D): B POS
Antibody Screen: NEGATIVE

## 2023-06-30 LAB — POCT I-STAT 7, (LYTES, BLD GAS, ICA,H+H)
Acid-Base Excess: 1 mmol/L (ref 0.0–2.0)
Bicarbonate: 28.2 mmol/L — ABNORMAL HIGH (ref 20.0–28.0)
Calcium, Ion: 0.92 mmol/L — ABNORMAL LOW (ref 1.15–1.40)
HCT: 37 % — ABNORMAL LOW (ref 39.0–52.0)
Hemoglobin: 12.6 g/dL — ABNORMAL LOW (ref 13.0–17.0)
O2 Saturation: 88 %
Patient temperature: 40.3
Potassium: >8.5 mmol/L (ref 3.5–5.1)
Sodium: 136 mmol/L (ref 135–145)
TCO2: 30 mmol/L (ref 22–32)
pCO2 arterial: 63.7 mm[Hg] — ABNORMAL HIGH (ref 32–48)
pH, Arterial: 7.27 — ABNORMAL LOW (ref 7.35–7.45)
pO2, Arterial: 76 mm[Hg] — ABNORMAL LOW (ref 83–108)

## 2023-06-30 LAB — I-STAT VENOUS BLOOD GAS, ED
Acid-base deficit: 12 mmol/L — ABNORMAL HIGH (ref 0.0–2.0)
Bicarbonate: 16.2 mmol/L — ABNORMAL LOW (ref 20.0–28.0)
Calcium, Ion: 0.84 mmol/L — CL (ref 1.15–1.40)
HCT: 30 % — ABNORMAL LOW (ref 39.0–52.0)
Hemoglobin: 10.2 g/dL — ABNORMAL LOW (ref 13.0–17.0)
O2 Saturation: 39 %
Potassium: 2.1 mmol/L — CL (ref 3.5–5.1)
Sodium: 149 mmol/L — ABNORMAL HIGH (ref 135–145)
TCO2: 18 mmol/L — ABNORMAL LOW (ref 22–32)
pCO2, Ven: 44.9 mm[Hg] (ref 44–60)
pH, Ven: 7.166 — CL (ref 7.25–7.43)
pO2, Ven: 29 mm[Hg] — CL (ref 32–45)

## 2023-06-30 LAB — ETHANOL: Alcohol, Ethyl (B): <10 mg/dL (ref <10)

## 2023-06-30 LAB — I-STAT CHEM 8, ED
BUN: 18 mg/dL (ref 8–23)
Calcium, Ion: 0.84 mmol/L — CL (ref 1.15–1.40)
Chloride: 112 mmol/L — ABNORMAL HIGH (ref 98–111)
Creatinine, Ser: 1.9 mg/dL — ABNORMAL HIGH (ref 0.61–1.24)
Glucose, Bld: 162 mg/dL — ABNORMAL HIGH (ref 70–99)
HCT: 29 % — ABNORMAL LOW (ref 39.0–52.0)
Hemoglobin: 9.9 g/dL — ABNORMAL LOW (ref 13.0–17.0)
Potassium: 2.1 mmol/L — CL (ref 3.5–5.1)
Sodium: 148 mmol/L — ABNORMAL HIGH (ref 135–145)
TCO2: 17 mmol/L — ABNORMAL LOW (ref 22–32)

## 2023-06-30 LAB — CBC
HCT: 42.5 % (ref 39.0–52.0)
Hemoglobin: 13 g/dL (ref 13.0–17.0)
MCH: 29.7 pg (ref 26.0–34.0)
MCHC: 30.6 g/dL (ref 30.0–36.0)
MCV: 97.3 fL (ref 80.0–100.0)
Platelets: 268 10*3/uL (ref 150–400)
RBC: 4.37 MIL/uL (ref 4.22–5.81)
RDW: 14.3 % (ref 11.5–15.5)
WBC: 7.9 10*3/uL (ref 4.0–10.5)
nRBC: 0.5 % — ABNORMAL HIGH (ref 0.0–0.2)

## 2023-06-30 LAB — GLUCOSE, CAPILLARY
Glucose-Capillary: 27 mg/dL — CL (ref 70–99)
Glucose-Capillary: 82 mg/dL (ref 70–99)

## 2023-06-30 LAB — MAGNESIUM: Magnesium: 2.2 mg/dL (ref 1.7–2.4)

## 2023-06-30 LAB — CBG MONITORING, ED
Glucose-Capillary: 158 mg/dL — ABNORMAL HIGH (ref 70–99)
Glucose-Capillary: 53 mg/dL — ABNORMAL LOW (ref 70–99)

## 2023-06-30 LAB — RESP PANEL BY RT-PCR (RSV, FLU A&B, COVID)  RVPGX2
Influenza A by PCR: NEGATIVE
Influenza B by PCR: NEGATIVE
Resp Syncytial Virus by PCR: NEGATIVE
SARS Coronavirus 2 by RT PCR: NEGATIVE

## 2023-06-30 LAB — PROTIME-INR
INR: 2 — ABNORMAL HIGH (ref 0.8–1.2)
Prothrombin Time: 22.5 s — ABNORMAL HIGH (ref 11.4–15.2)

## 2023-06-30 LAB — BRAIN NATRIURETIC PEPTIDE: B Natriuretic Peptide: 516.3 pg/mL — ABNORMAL HIGH (ref 0.0–100.0)

## 2023-06-30 LAB — CREATININE, SERUM
Creatinine, Ser: 3.38 mg/dL — ABNORMAL HIGH (ref 0.61–1.24)
GFR, Estimated: 18 mL/min — ABNORMAL LOW (ref >60)

## 2023-06-30 LAB — TROPONIN I (HIGH SENSITIVITY)
Troponin I (High Sensitivity): 336 ng/L (ref <18)
Troponin I (High Sensitivity): 923 ng/L (ref <18)

## 2023-06-30 LAB — MRSA NEXT GEN BY PCR, NASAL: MRSA by PCR Next Gen: NOT DETECTED

## 2023-06-30 LAB — POTASSIUM: Potassium: 3.4 mmol/L — ABNORMAL LOW (ref 3.5–5.1)

## 2023-06-30 LAB — I-STAT CG4 LACTIC ACID, ED: Lactic Acid, Venous: 7.1 mmol/L (ref 0.5–1.9)

## 2023-06-30 MED ORDER — ORAL CARE MOUTH RINSE: 15.0000 mL | OROMUCOSAL | Status: DC | PRN

## 2023-06-30 MED ORDER — BUSPIRONE HCL 15 MG PO TABS: 30.0000 mg | ORAL_TABLET | Freq: Three times a day (TID) | ORAL | Status: DC | PRN

## 2023-06-30 MED ORDER — DOCUSATE SODIUM 50 MG/5ML PO LIQD: 100.0000 mg | Freq: Two times a day (BID) | ORAL | Status: DC | PRN

## 2023-06-30 MED ORDER — SODIUM BICARBONATE 8.4 % IV SOLN
INTRAVENOUS | Status: AC | PRN
  Administered 2023-06-30: 100 meq via INTRAVENOUS

## 2023-06-30 MED ORDER — SODIUM BICARBONATE 8.4 % IV SOLN
INTRAVENOUS | Status: AC
  Filled 2023-06-30: qty 150

## 2023-06-30 MED ORDER — SODIUM BICARBONATE 8.4 % IV SOLN
INTRAVENOUS | Status: AC
  Administered 2023-06-30: 50 meq
  Filled 2023-06-30: qty 100

## 2023-06-30 MED ORDER — MAGNESIUM SULFATE 2 GM/50ML IV SOLN
2.0000 g | Freq: Once | INTRAVENOUS | Status: AC
Start: 2023-06-30 — End: 2023-06-30
  Administered 2023-06-30: 2 g via INTRAVENOUS

## 2023-06-30 MED ORDER — HYDROCORTISONE SOD SUC (PF) 100 MG IJ SOLR
100.0000 mg | Freq: Two times a day (BID) | INTRAMUSCULAR | Status: DC
  Administered 2023-06-30: 100 mg via INTRAVENOUS
  Filled 2023-06-30: qty 2

## 2023-06-30 MED ORDER — DOCUSATE SODIUM 100 MG PO CAPS: 100.0000 mg | ORAL_CAPSULE | Freq: Two times a day (BID) | ORAL | Status: DC | PRN

## 2023-06-30 MED ORDER — VASOPRESSIN 20 UNITS/100 ML INFUSION FOR SHOCK
0.0000 [IU]/min | INTRAVENOUS | Status: DC
  Administered 2023-06-30: 0.03 [IU]/min via INTRAVENOUS
  Filled 2023-06-30: qty 100

## 2023-06-30 MED ORDER — ATROPINE SULFATE 1 MG/10ML IJ SOSY
PREFILLED_SYRINGE | INTRAMUSCULAR | Status: AC | PRN
  Administered 2023-06-30: 1 mg via INTRAVENOUS

## 2023-06-30 MED ORDER — POLYETHYLENE GLYCOL 3350 17 G PO PACK: 17.0000 g | PACK | Freq: Every day | ORAL | Status: DC | PRN

## 2023-06-30 MED ORDER — PANTOPRAZOLE SODIUM 40 MG IV SOLR: 40.0000 mg | Freq: Every day | INTRAVENOUS | Status: DC

## 2023-06-30 MED ORDER — SODIUM CHLORIDE 0.9 % IV SOLN
2.0000 g | Freq: Once | INTRAVENOUS | Status: AC
  Filled 2023-06-30: qty 12.5

## 2023-06-30 MED ORDER — NOREPINEPHRINE 4 MG/250ML-% IV SOLN
2.0000 ug/min | INTRAVENOUS | Status: DC
  Administered 2023-06-30: 2 ug/min via INTRAVENOUS
  Filled 2023-06-30: qty 250

## 2023-06-30 MED ORDER — POTASSIUM CHLORIDE 20 MEQ PO PACK: 60.0000 meq | PACK | Freq: Once | ORAL | Status: DC

## 2023-06-30 MED ORDER — ACETAMINOPHEN 10 MG/ML IV SOLN
1000.0000 mg | INTRAVENOUS | Status: AC
  Administered 2023-06-30: 1000 mg via INTRAVENOUS
  Filled 2023-06-30: qty 100

## 2023-06-30 MED ORDER — AMIODARONE HCL IN DEXTROSE 360-4.14 MG/200ML-% IV SOLN: 30.0000 mg/h | INTRAVENOUS | Status: DC

## 2023-06-30 MED ORDER — CALCIUM CHLORIDE 10 % IV SOLN
INTRAVENOUS | Status: AC | PRN
  Administered 2023-06-30: 1 g via INTRAVENOUS

## 2023-06-30 MED ORDER — VANCOMYCIN HCL IN DEXTROSE 1-5 GM/200ML-% IV SOLN: 1000.0000 mg | INTRAVENOUS | Status: DC

## 2023-06-30 MED ORDER — VANCOMYCIN HCL 10 G IV SOLR
2500.0000 mg | Freq: Once | INTRAVENOUS | Status: AC
  Administered 2023-06-30: 2500 mg via INTRAVENOUS
  Filled 2023-06-30: qty 2500

## 2023-06-30 MED ORDER — EPINEPHRINE HCL 5 MG/250ML IV SOLN IN NS
0.5000 ug/min | INTRAVENOUS | Status: DC
  Administered 2023-06-30: 5 ug/min via INTRAVENOUS
  Administered 2023-06-30: 20 ug/min via INTRAVENOUS
  Filled 2023-06-30: qty 250

## 2023-06-30 MED ORDER — NOREPINEPHRINE 4 MG/250ML-% IV SOLN: 0.0000 ug/min | INTRAVENOUS | Status: DC

## 2023-06-30 MED ORDER — FAMOTIDINE 20 MG PO TABS: 20.0000 mg | ORAL_TABLET | Freq: Every day | ORAL | Status: DC

## 2023-06-30 MED ORDER — FENTANYL BOLUS VIA INFUSION: 25.0000 ug | INTRAVENOUS | Status: DC | PRN

## 2023-06-30 MED ORDER — FENTANYL CITRATE PF 50 MCG/ML IJ SOSY
25.0000 ug | PREFILLED_SYRINGE | Freq: Once | INTRAMUSCULAR | Status: AC
  Administered 2023-06-30: 25 ug via INTRAVENOUS
  Filled 2023-06-30: qty 1

## 2023-06-30 MED ORDER — SODIUM CHLORIDE 0.9 % IV SOLN
250.0000 mL | INTRAVENOUS | Status: DC
  Administered 2023-06-30: 250 mL via INTRAVENOUS

## 2023-06-30 MED ORDER — DEXTROSE 50 % IV SOLN
INTRAVENOUS | Status: AC | PRN
  Administered 2023-06-30: 1 via INTRAVENOUS

## 2023-06-30 MED ORDER — METRONIDAZOLE 500 MG/100ML IV SOLN
500.0000 mg | Freq: Once | INTRAVENOUS | Status: AC
  Administered 2023-06-30: 500 mg via INTRAVENOUS
  Filled 2023-06-30: qty 100

## 2023-06-30 MED ORDER — ORAL CARE MOUTH RINSE: 15.0000 mL | OROMUCOSAL | Status: DC

## 2023-06-30 MED ORDER — NOREPINEPHRINE 16 MG/250ML-% IV SOLN
0.0000 ug/min | INTRAVENOUS | Status: DC
  Administered 2023-06-30: 40 ug/min via INTRAVENOUS
  Filled 2023-06-30: qty 250

## 2023-06-30 MED ORDER — DEXTROSE 50 % IV SOLN
INTRAVENOUS | Status: AC
  Filled 2023-06-30: qty 50

## 2023-06-30 MED ORDER — AMIODARONE HCL IN DEXTROSE 360-4.14 MG/200ML-% IV SOLN
60.0000 mg/h | INTRAVENOUS | Status: AC
  Administered 2023-06-30: 60 mg/h via INTRAVENOUS

## 2023-06-30 MED ORDER — FENTANYL 2500MCG IN NS 250ML (10MCG/ML) PREMIX INFUSION
25.0000 ug/h | INTRAVENOUS | Status: DC
  Administered 2023-06-30: 25 ug/h via INTRAVENOUS
  Filled 2023-06-30: qty 250

## 2023-06-30 MED ORDER — FAMOTIDINE 20 MG PO TABS: 20.0000 mg | ORAL_TABLET | Freq: Two times a day (BID) | ORAL | Status: DC

## 2023-06-30 MED ORDER — POTASSIUM CHLORIDE 10 MEQ/100ML IV SOLN
10.0000 meq | INTRAVENOUS | Status: AC
  Administered 2023-06-30: 10 meq via INTRAVENOUS
  Filled 2023-06-30 (×2): qty 100

## 2023-06-30 MED ORDER — EPINEPHRINE 1 MG/10ML IJ SOSY
PREFILLED_SYRINGE | INTRAMUSCULAR | Status: AC | PRN
  Administered 2023-06-30 (×3): 1 mg via INTRAVENOUS

## 2023-06-30 MED ORDER — SODIUM CHLORIDE 0.9 % IV SOLN: 2.0000 g | Freq: Two times a day (BID) | INTRAVENOUS | Status: DC

## 2023-07-01 LAB — BLOOD CULTURE ID PANEL (REFLEXED) - BCID2
A.calcoaceticus-baumannii: NOT DETECTED
A.calcoaceticus-baumannii: NOT DETECTED
Bacteroides fragilis: NOT DETECTED
Bacteroides fragilis: NOT DETECTED
CTX-M ESBL: NOT DETECTED
Candida albicans: NOT DETECTED
Candida albicans: NOT DETECTED
Candida auris: NOT DETECTED
Candida auris: NOT DETECTED
Candida glabrata: NOT DETECTED
Candida glabrata: NOT DETECTED
Candida krusei: NOT DETECTED
Candida krusei: NOT DETECTED
Candida parapsilosis: NOT DETECTED
Candida parapsilosis: NOT DETECTED
Candida tropicalis: NOT DETECTED
Candida tropicalis: NOT DETECTED
Carbapenem resist OXA 48 LIKE: NOT DETECTED
Carbapenem resistance IMP: NOT DETECTED
Carbapenem resistance KPC: NOT DETECTED
Carbapenem resistance NDM: NOT DETECTED
Carbapenem resistance VIM: NOT DETECTED
Cryptococcus neoformans/gattii: NOT DETECTED
Cryptococcus neoformans/gattii: NOT DETECTED
Enterobacter cloacae complex: NOT DETECTED
Enterobacter cloacae complex: NOT DETECTED
Enterobacterales: DETECTED — AB
Enterobacterales: NOT DETECTED
Enterococcus Faecium: NOT DETECTED
Enterococcus Faecium: NOT DETECTED
Enterococcus faecalis: NOT DETECTED
Enterococcus faecalis: NOT DETECTED
Escherichia coli: NOT DETECTED
Escherichia coli: NOT DETECTED
Haemophilus influenzae: NOT DETECTED
Haemophilus influenzae: NOT DETECTED
Klebsiella aerogenes: NOT DETECTED
Klebsiella aerogenes: NOT DETECTED
Klebsiella oxytoca: NOT DETECTED
Klebsiella oxytoca: NOT DETECTED
Klebsiella pneumoniae: DETECTED — AB
Klebsiella pneumoniae: NOT DETECTED
Listeria monocytogenes: NOT DETECTED
Listeria monocytogenes: NOT DETECTED
Neisseria meningitidis: NOT DETECTED
Neisseria meningitidis: NOT DETECTED
Proteus species: NOT DETECTED
Proteus species: NOT DETECTED
Pseudomonas aeruginosa: NOT DETECTED
Pseudomonas aeruginosa: NOT DETECTED
Salmonella species: NOT DETECTED
Salmonella species: NOT DETECTED
Serratia marcescens: NOT DETECTED
Serratia marcescens: NOT DETECTED
Staphylococcus aureus (BCID): NOT DETECTED
Staphylococcus aureus (BCID): NOT DETECTED
Staphylococcus epidermidis: NOT DETECTED
Staphylococcus epidermidis: NOT DETECTED
Staphylococcus lugdunensis: NOT DETECTED
Staphylococcus lugdunensis: NOT DETECTED
Staphylococcus species: NOT DETECTED
Staphylococcus species: DETECTED — AB
Stenotrophomonas maltophilia: NOT DETECTED
Stenotrophomonas maltophilia: NOT DETECTED
Streptococcus agalactiae: NOT DETECTED
Streptococcus agalactiae: NOT DETECTED
Streptococcus pneumoniae: NOT DETECTED
Streptococcus pneumoniae: NOT DETECTED
Streptococcus pyogenes: NOT DETECTED
Streptococcus pyogenes: NOT DETECTED
Streptococcus species: NOT DETECTED
Streptococcus species: NOT DETECTED

## 2023-07-03 LAB — CULTURE, BLOOD (ROUTINE X 2)

## 2023-07-05 NOTE — Discharge Summary (Signed)
DEATH SUMMARY   Patient Details  Name: Shane Diaz MRN: [id number] DOB: 29-Jun-1946  Admission/Discharge Information   Admit Date:  30-Jul-2023  Date of Death: Date of Death: 2023/07/30  Time of Death: Time of Death: 1902  Length of Stay: 1  Referring Physician: Ralene Ok, MD   Reason(s) for Hospitalization  Septic shock  Diagnoses  Preliminary cause of death: biliary sepsis leading to septic shock.   Secondary Diagnoses (including complications and co-morbidities):  Principal Problem:   Respiratory failure with hypoxia (HCC) Active Problems:   Cardiac arrest (HCC) AKI Lactic acidosis Cholecystitis status post biliary drain placement Morbid obesity Obesity hypoventilation syndrome Atrial fibrillation  Brief Hospital Course (including significant findings, care, treatment, and services provided and events leading to death)  Tawn Hill is a 77 y.o. year old male who presented in extremis and arrested in the emergency department. He was resuscitated after of ACLS. He had morbid obesity and OHS and has been bed-bound for the past 3 months since he was hospitalized for acute cholecystitis which was treated by percutaneous drainage which has required several manipulations.  In the ED he was mottled and unresponsive on exam. He remained tachypneic despite full ventilatory support. Arterial and central lines were placed and vasopressor and inotropic therapy was escalated for refractory hypotension.  The biliary drain appeared to be blocked.  We were finally able to reach family who informed us that the patient did not want to be placed on life support.  He was was transitioned to comfort care once his son arrived.  Blood cultures have subsequently grown K.pneumoniae consistent with septic shock from a biliary source.    Pertinent Labs and Studies  Significant Diagnostic Studies DG Chest Portable 1 View  Result Date: 07-30-23 CLINICAL DATA:  Cardiac arrest. EXAM:  PORTABLE CHEST 1 VIEW COMPARISON:  Chest radiograph dated 02/23/2023. FINDINGS: Endotracheal tube with tip approximately 4.5 cm above the carina. Enteric tube with tip just distal to the GE junction. Recommend further advancing by additional 9 cm. Bilateral mid to lower lung field confluent densities may represent aspiration, pneumonia, or contusion in the setting of CPR. No large pleural effusion. No pneumothorax. Mild cardiomegaly. Atherosclerotic calcification of the aorta. No acute osseous pathology. IMPRESSION: 1. Endotracheal tube with tip approximately 4.5 cm above the carina. 2. Enteric tube with tip just distal to the GE junction. Recommend further advancing by additional 9 cm. 3. Bilateral mid to lower lung field confluent densities. Electronically Signed   By: Elgie Collard M.D.   On: 07/30/23 17:28   IR EXCHANGE BILIARY DRAIN  Result Date: 06/02/2023 INDICATION: History of acute cholecystitis, post ultrasound fluoroscopic guided cholecystostomy tube placement on 02/23/2023. Patient's last chole tube exchange on 04/21/2023 and placed on capping trial. EXAM: FLUOROSCOPIC GUIDED CHOLECYSTOSTOMY TUBE EXCHANGE COMPARISON:  IR fluoroscopy, 04/21/2023. MEDICATIONS: None ANESTHESIA/SEDATION: Local anesthetic was administered. CONTRAST:  15mL OMNIPAQUE IOHEXOL 300 MG/ML SOLN - administered into the gallbladder lumen. FLUOROSCOPY TIME:  Fluoroscopic dose; 5 mGy COMPLICATIONS: None immediate. PROCEDURE: The patient was positioned supine on the fluoroscopy table. The external portion of the existing cholecystostomy tube as well as the surrounding skin was prepped and draped in usual sterile fashion. A time-out was performed prior to the initiation of the procedure. A preprocedural spot fluoroscopic image was obtained of the right upper abdominal quadrant existing cholecystostomy tube. The skin surrounding the cholecystostomy tube was anesthetized with 1% lidocaine with epinephrine. The external portion of  the cholecystostomy tube was cut and cannulated with a short  Amplatz wire which was advanced through the tube and coiled within the gallbladder lumen Next, under intermittent fluoroscopic guidance, the existing 12 Fr cholecystostomy tube was exchanged for a new 12 Fr cholecystostomy tube which was repositioned into the more central aspect of the gallbladder lumen. Contrast injection confirms appropriate positioning and functionality of the cholecystomy tube. The cholecystostomy tube was flushed with a small amount of saline and reconnected to a gravity bag. The cholecystostomy tube was secured with an interrupted suture and a Stat Lock device. A dressing was applied. The patient tolerated the procedure well without immediate postprocedural complication. FINDINGS: Preprocedural spot fluoroscopic image demonstrates unchanged positioning of cholecystostomy tube with end coiled and locked over the expected location of the fundus of the gallbladder After fluoroscopic guided exchange, the new, 12 Fr cholecystostomy tube is more ideally positioned with end coiled and locked within the central aspect of the gallbladder lumen. Post exchange cholangiogram demonstrates appropriate positioning and functionality of the new cholecystostomy tube. IMPRESSION: 1. Successful fluoroscopic guided exchange for a new 12 Fr cholecystostomy tube. 2. Patent cystic and common bile ducts. PLAN: The patient was placed on a capping trial. He was previously scheduled for surgical follow-up with a Dr. Derrell Lolling, on 04/30/2023. The patient was not aware of any surgical follow-up. We will try to facilitate/request surgical evaluation for potential cholecystectomy. Should the catheter remain, he will return to Vascular Interventional Radiology (VIR) for routine drainage catheter evaluation and exchange in 8 weeks. Roanna Banning, MD Vascular and Interventional Radiology Specialists [facility] Radiology Electronically Signed   By: Roanna Banning M.D.   On:  06/02/2023 16:09    Microbiology Recent Results (from the past 240 hour(s))  MRSA Next Gen by PCR, Nasal     Status: None   Collection Time: 06/19/2023 12:40 PM   Specimen: Nasal Mucosa; Nasal Swab  Result Value Ref Range Status   MRSA by PCR Next Gen NOT DETECTED NOT DETECTED Final    Comment: (NOTE) The GeneXpert MRSA Assay (FDA approved for NASAL specimens only), is one component of a comprehensive MRSA colonization surveillance program. It is not intended to diagnose MRSA infection nor to guide or monitor treatment for MRSA infections. Test performance is not FDA approved in patients less than 61 years old. Performed at [facility], [address], [place], [place] [address]   Culture, blood (routine x 2)     Status: None (Preliminary result)   Collection Time: 06/05/2023  1:07 PM   Specimen: BLOOD RIGHT ARM  Result Value Ref Range Status   Specimen Description BLOOD RIGHT ARM  Final   Special Requests   Final    BOTTLES DRAWN AEROBIC ONLY Blood Culture results may not be optimal due to an inadequate volume of blood received in culture bottles   Culture  Setup Time   Final    GRAM NEGATIVE RODS AEROBIC BOTTLE ONLY PATIENT DISCHARGED OR EXPIRED Performed at [facility], [address] [place], [place] [address]    Culture GRAM NEGATIVE RODS  Final   Report Status PENDING  Incomplete  Culture, blood (routine x 2)     Status: None (Preliminary result)   Collection Time: 06/15/2023  1:07 PM   Specimen: BLOOD RIGHT ARM  Result Value Ref Range Status   Specimen Description BLOOD RIGHT ARM  Final   Special Requests   Final    BOTTLES DRAWN AEROBIC AND ANAEROBIC Blood Culture results may not be optimal due to an inadequate volume of blood received  in culture bottles   Culture  Setup Time   Final    GRAM POSITIVE COCCI IN BOTH AEROBIC AND ANAEROBIC BOTTLES PATIENT DISCHARGED OR EXPIRED Performed at [facility], [address], [place], [place] [address]     Culture GRAM POSITIVE COCCI  Final   Report Status PENDING  Incomplete  Blood Culture ID Panel (Reflexed)     Status: Abnormal   Collection Time: 07/04/2023  1:07 PM  Result Value Ref Range Status   Enterococcus faecalis NOT DETECTED NOT DETECTED Final   Enterococcus Faecium NOT DETECTED NOT DETECTED Final   Listeria monocytogenes NOT DETECTED NOT DETECTED Final   Staphylococcus species NOT DETECTED NOT DETECTED Final   Staphylococcus aureus (BCID) NOT DETECTED NOT DETECTED Final   Staphylococcus epidermidis NOT DETECTED NOT DETECTED Final   Staphylococcus lugdunensis NOT DETECTED NOT DETECTED Final   Streptococcus species NOT DETECTED NOT DETECTED Final   Streptococcus agalactiae NOT DETECTED NOT DETECTED Final   Streptococcus pneumoniae NOT DETECTED NOT DETECTED Final   Streptococcus pyogenes NOT DETECTED NOT DETECTED Final   A.calcoaceticus-baumannii NOT DETECTED NOT DETECTED Final   Bacteroides fragilis NOT DETECTED NOT DETECTED Final   Enterobacterales DETECTED (A) NOT DETECTED Final    Comment: Enterobacterales represent a large order of gram negative bacteria, not a single organism. PATIENT DISCHARGED OR EXPIRED    Enterobacter cloacae complex NOT DETECTED NOT DETECTED Final   Escherichia coli NOT DETECTED NOT DETECTED Final   Klebsiella aerogenes NOT DETECTED NOT DETECTED Final   Klebsiella oxytoca NOT DETECTED NOT DETECTED Final   Klebsiella pneumoniae DETECTED (A) NOT DETECTED Final    Comment: PATIENT DISCHARGED OR EXPIRED   Proteus species NOT DETECTED NOT DETECTED Final   Salmonella species NOT DETECTED NOT DETECTED Final   Serratia marcescens NOT DETECTED NOT DETECTED Final   Haemophilus influenzae NOT DETECTED NOT DETECTED Final   Neisseria meningitidis NOT DETECTED NOT DETECTED Final   Pseudomonas aeruginosa NOT DETECTED NOT DETECTED Final   Stenotrophomonas maltophilia NOT DETECTED NOT DETECTED Final   Candida albicans NOT DETECTED NOT DETECTED Final   Candida auris  NOT DETECTED NOT DETECTED Final   Candida glabrata NOT DETECTED NOT DETECTED Final   Candida krusei NOT DETECTED NOT DETECTED Final   Candida parapsilosis NOT DETECTED NOT DETECTED Final   Candida tropicalis NOT DETECTED NOT DETECTED Final   Cryptococcus neoformans/gattii NOT DETECTED NOT DETECTED Final   CTX-M ESBL NOT DETECTED NOT DETECTED Final   Carbapenem resistance IMP NOT DETECTED NOT DETECTED Final   Carbapenem resistance KPC NOT DETECTED NOT DETECTED Final   Carbapenem resistance NDM NOT DETECTED NOT DETECTED Final   Carbapenem resist OXA 48 LIKE NOT DETECTED NOT DETECTED Final   Carbapenem resistance VIM NOT DETECTED NOT DETECTED Final    Comment: Performed at [facility], [address], [place], [place] [address]  Resp panel by RT-PCR (RSV, Flu A&B, Covid) Anterior Nasal Swab     Status: None   Collection Time: 06/29/2023  2:30 PM   Specimen: Anterior Nasal Swab  Result Value Ref Range Status   SARS Coronavirus 2 by RT PCR NEGATIVE NEGATIVE Final   Influenza A by PCR NEGATIVE NEGATIVE Final   Influenza B by PCR NEGATIVE NEGATIVE Final    Comment: (NOTE) The Xpert Xpress SARS-CoV-2/FLU/RSV plus assay is intended as an aid in the diagnosis of influenza from Nasopharyngeal swab specimens and should not be used as a sole basis for treatment. Nasal washings and aspirates are unacceptable for  Xpert Xpress SARS-CoV-2/FLU/RSV testing.  Fact Sheet for Patients: BloggerCourse.com  Fact Sheet for Healthcare Providers: SeriousBroker.it  This test is not yet approved or cleared by the [place] FDA and has been authorized for detection and/or diagnosis of SARS-CoV-2 by FDA under an Emergency Use Authorization (EUA). This EUA will remain in effect (meaning this test can be used) for the duration of the COVID-19 declaration under Section 564(b)(1) of the Act, 21 U.S.C. section 360bbb-3(b)(1), unless the authorization is  terminated or revoked.     Resp Syncytial Virus by PCR NEGATIVE NEGATIVE Final    Comment: (NOTE) Fact Sheet for Patients: BloggerCourse.com  Fact Sheet for Healthcare Providers: SeriousBroker.it  This test is not yet approved or cleared by the [place] FDA and has been authorized for detection and/or diagnosis of SARS-CoV-2 by FDA under an Emergency Use Authorization (EUA). This EUA will remain in effect (meaning this test can be used) for the duration of the COVID-19 declaration under Section 564(b)(1) of the Act, 21 U.S.C. section 360bbb-3(b)(1), unless the authorization is terminated or revoked.  Performed at [facility], [address] [place], [place] [address]   Blood Culture ID Panel (Reflexed)     Status: Abnormal   Collection Time: 07/01/23  7:19 AM  Result Value Ref Range Status   Enterococcus faecalis NOT DETECTED NOT DETECTED Final   Enterococcus Faecium NOT DETECTED NOT DETECTED Final   Listeria monocytogenes NOT DETECTED NOT DETECTED Final   Staphylococcus species DETECTED (A) NOT DETECTED Final    Comment: PATIENT DISCHARGED OR EXPIRED   Staphylococcus aureus (BCID) NOT DETECTED NOT DETECTED Final   Staphylococcus epidermidis NOT DETECTED NOT DETECTED Final   Staphylococcus lugdunensis NOT DETECTED NOT DETECTED Final   Streptococcus species NOT DETECTED NOT DETECTED Final   Streptococcus agalactiae NOT DETECTED NOT DETECTED Final   Streptococcus pneumoniae NOT DETECTED NOT DETECTED Final   Streptococcus pyogenes NOT DETECTED NOT DETECTED Final   A.calcoaceticus-baumannii NOT DETECTED NOT DETECTED Final   Bacteroides fragilis NOT DETECTED NOT DETECTED Final   Enterobacterales NOT DETECTED NOT DETECTED Final   Enterobacter cloacae complex NOT DETECTED NOT DETECTED Final   Escherichia coli NOT DETECTED NOT DETECTED Final   Klebsiella aerogenes NOT DETECTED NOT DETECTED Final   Klebsiella oxytoca NOT  DETECTED NOT DETECTED Final   Klebsiella pneumoniae NOT DETECTED NOT DETECTED Final   Proteus species NOT DETECTED NOT DETECTED Final   Salmonella species NOT DETECTED NOT DETECTED Final   Serratia marcescens NOT DETECTED NOT DETECTED Final   Haemophilus influenzae NOT DETECTED NOT DETECTED Final   Neisseria meningitidis NOT DETECTED NOT DETECTED Final   Pseudomonas aeruginosa NOT DETECTED NOT DETECTED Final   Stenotrophomonas maltophilia NOT DETECTED NOT DETECTED Final   Candida albicans NOT DETECTED NOT DETECTED Final   Candida auris NOT DETECTED NOT DETECTED Final   Candida glabrata NOT DETECTED NOT DETECTED Final   Candida krusei NOT DETECTED NOT DETECTED Final   Candida parapsilosis NOT DETECTED NOT DETECTED Final   Candida tropicalis NOT DETECTED NOT DETECTED Final   Cryptococcus neoformans/gattii NOT DETECTED NOT DETECTED Final    Comment: Performed at [facility], [address] [place], [place] [address]    Lab Basic Metabolic Panel: Recent Labs  Lab 06/15/2023 1256 06/21/2023 1300 06/15/2023 1538 06/05/2023 1540  NA 142 148*  149* 136  --   K 2.7* 2.1*  2.1* >8.5* 3.4*  CL 114* 112*  --   --   CO2 16*  --   --   --  GLUCOSE 171* 162*  --   --   BUN 21 18  --   --   CREATININE 2.04* 1.90*  --  3.38*  CALCIUM 6.2*  --   --   --   MG  --   --   --  2.2   Liver Function Tests: Recent Labs  Lab 06/07/2023 1256  AST 63*  ALT 23  ALKPHOS 58  BILITOT 0.6  PROT 4.3*  ALBUMIN 1.7*   No results for input(s): "LIPASE", "AMYLASE" in the last 168 hours. No results for input(s): "AMMONIA" in the last 168 hours. CBC: Recent Labs  Lab 06/19/2023 1256 06/09/2023 1300 06/20/2023 1538 06/19/2023 1653  WBC 24.4*  --   --  7.9  NEUTROABS 23.2*  --   --   --   HGB 7.7* 9.9*  10.2* 12.6* 13.0  HCT 25.9* 29.0*  30.0* 37.0* 42.5  MCV 105.7*  --   --  97.3  PLT 147*  --   --  268   Cardiac Enzymes: No results for input(s): "CKTOTAL", "CKMB", "CKMBINDEX", "TROPONINI" in the  last 168 hours. Sepsis Labs: Recent Labs  Lab 06/11/2023 1256 06/18/2023 1301 06/10/2023 1653  WBC 24.4*  --  7.9  LATICACIDVEN  --  7.1*  --     Procedures/Operations  Intubation, central and arterial line placement.    Luciel Brickman 07/01/2023, 12:53 PM

## 2023-07-05 NOTE — Progress Notes (Signed)
Chaplain responded to req from pt's nurse to visit with family of pt who have not had the comfort of a visit from their [place] [employer] Zorita Pang b/c he is out of town until Saturday.  Daughter and pt's wife at bedside.  Daughter explained her stepmother would like Chaplain to have prayer for her husband.  Chaplain prepared [place] [employer] prayers in a handout so family could follow along.  Wife, daughter and two grandchildren at bedside for service of prayer as well as two nurses.    Chaplain will leave Spiritual Consult in place as  daughter explained her brother (pt's son) is coming from out of town to visit with his father in the morning. Family has moved father to comfort care but will keep ventilator in place so son can visit his father.  Day Chaplains referral in place to hopefully offer visit while son is here and/or at time of death.  Vernell Morgans Chaplain

## 2023-07-05 NOTE — ED Notes (Signed)
Date and time results received: 06/06/2023 2:01 PM  (use smartphrase ".now" to insert current time)  Test: Ca 6.2. K+ 2.7 Trop 336 Critical Value:   Name of Provider Notified: Dr. Denese Killings  Orders Received? Or Actions Taken?:

## 2023-07-05 NOTE — Procedures (Signed)
Arterial Catheter Insertion Procedure Note  Shane Diaz  [id number]  [birth year]  Date:06/10/2023  Time:5:14 PM    Provider Performing: Cristopher Peru    Procedure: Insertion of Arterial Line (40981) with US guidance (19147)   Indication(s) Blood pressure monitoring and/or need for frequent ABGs  Consent Unable to obtain consent due to emergent nature of procedure.  Anesthesia 1% lidoicaine   Time Out Verified patient identification, verified procedure, site/side was marked, verified correct patient position, special equipment/implants available, medications/allergies/relevant history reviewed, required imaging and test results available.   Sterile Technique Maximal sterile technique including full sterile barrier drape, hand hygiene, sterile gown, sterile gloves, mask, hair covering, sterile ultrasound probe cover (if used).   Procedure Description Area of catheter insertion was cleaned with chlorhexidine and draped in sterile fashion. With real-time ultrasound guidance an arterial catheter was placed into the right femoral artery.  Appropriate arterial tracings confirmed on monitor.     Complications/Tolerance None; patient tolerated the procedure well.   EBL Minimal   Specimen(s) None

## 2023-07-05 NOTE — ED Notes (Signed)
Pt rhythm changed to SVT at this time, md agarwala verbalized shock synchronized at 200j. Immediately following shock, rhythm changed to sinus rhythm.

## 2023-07-05 NOTE — ED Notes (Signed)
Please update family , brown [phone]

## 2023-07-05 NOTE — IPAL (Cosign Needed)
  Interdisciplinary Goals of Care Family Meeting   Date carried out: 06/15/2023  Location of the meeting: Bedside  Member's involved: Nurse Practitioner, Bedside Registered Nurse, and Family Member or next of kin  Durable Power of Attorney or acting medical decision maker: Daughter Shane Diaz     Discussion: We discussed goals of care for Shane Diaz .  Family presented his advanced directive to the medical staff. Patient had wishes to be kept comfortable. He has been bed bound for several months per family. Considering multiple cardiac arrest and prolonged shock state there is high likelihood of anoxic injury. Family has elected to pursue comfort measures, but does not want to discontinue life support until the patient's son can arrive.   Code status:   Code Status: Full Code   Disposition: In-patient comfort care  Time spent for the meeting: 28 minutes    [name], NP  06/11/2023, 4:56 PM

## 2023-07-05 NOTE — Procedures (Signed)
DIRECT CURRENT CARDIOVERSION   Shane Diaz  [id number]  1946-04-03   Date:06/08/2023  Time:3:32 PM   Provider Performing:Markee Matera   INDICATIONS: Atrial fibrillation with rapid ventricular response.   PROCEDURE:  Emergent cardioversion. Once an appropriate time out was taken, the patient had the defibrillator pads placed in the anterior and posterior position. The patient then underwent sedation by the anesthesia service. Once an appropriate level of sedation was achieved, the patient received a single biphasic, synchronized 200J shock with prompt conversion to sinus rhythm. No apparent complications.   Lynnell Catalan, MD [facility] ICU Physician [facility] [place] [facility]  Pager: [phone] Or Epic Secure Chat After hours: [phone].  06/11/2023, 3:33 PM

## 2023-07-05 NOTE — ED Provider Notes (Signed)
[place] EMERGENCY DEPARTMENT AT [facility] Provider Note   CSN: [id number] Arrival date & time: 07/02/2023  1240     History  No chief complaint on file.   Shane Diaz is a 77 y.o. male.  HPI 77 year old male presents with altered mental status.  Upon first arrival, patient is obtunded and history is only from paramedic.  However the paramedic did not get much help from family.  It was originally an "unknown" complaint phone call to 911.  Patient was on the bed laying flat and there was no signs of trauma.  EMS reports that he seemed a little responsive at first but has progressively worsened and his respiratory status has also worsened.  They lost radial pulses but had a carotid pulse when they parked in the ambulance bay.  However, upon transferring him from EMS stretcher to the ED stretcher it appears that he does not have a pulse and CPR was started.  Home Medications Prior to Admission medications   Medication Sig Start Date End Date Taking? Authorizing Provider  acetaminophen (TYLENOL) 325 MG tablet Take 2 tablets (650 mg total) by mouth every 4 (four) hours as needed for fever, headache or mild pain. 02/28/23   Elgergawy, Leana Roe, MD  apixaban (ELIQUIS) 5 MG TABS tablet Take 1 tablet (5 mg total) by mouth 2 (two) times daily. 10/23/22   [name], [name], MD  bisacodyl 5 MG EC tablet Take 10 mg by mouth daily as needed for moderate constipation.    [provider]  bismuth subsalicylate (PEPTO BISMOL) 262 MG/15ML suspension Take 30 mLs by mouth as needed for diarrhea or loose stools or indigestion.    [provider]  bumetanide (BUMEX) 2 MG tablet TAKE 2 TABLETS BY MOUTH TWICE A DAY 05/26/23   [name], [name], MD  levothyroxine (SYNTHROID) 100 MCG tablet Take 1 tablet (100 mcg total) by mouth daily at 6 (six) AM. 02/13/23 03/15/23  [name], [name], MD  nitroGLYCERIN (NITROSTAT) 0.4 MG SL tablet Place 1 tablet (0.4 mg total) under the tongue every  5 (five) minutes x 3 doses as needed for chest pain. 12/04/21   [name], [name], MD      Allergies    Diltiazem and Amiodarone    Review of Systems   Review of Systems  Unable to perform ROS: Patient unresponsive    Physical Exam Updated Vital Signs BP (!) 66/40   Ht 5\' 8"  (1.727 m)   Wt 135 kg Comment: estimated  BMI 45.25 kg/m  Physical Exam Vitals and nursing note reviewed.  Constitutional:      Appearance: He is well-developed. He is obese.  HENT:     Head: Normocephalic and atraumatic.  Cardiovascular:     Pulses:          Carotid pulses are 0 on the left side. Pulmonary:     Effort: Pulmonary effort is normal.  Abdominal:     General: There is no distension.     Palpations: Abdomen is soft.     Comments: Right biliary drain in place  Skin:    General: Skin is warm and dry.  Neurological:     Mental Status: He is unresponsive.     ED Results / Procedures / Treatments   Labs (all labs ordered are listed, but only abnormal results are displayed) Labs Reviewed  CBC WITH DIFFERENTIAL/PLATELET - Abnormal; Notable for the following components:      Result Value   WBC 24.4 (*)  RBC 2.45 (*)    Hemoglobin 7.7 (*)    HCT 25.9 (*)    MCV 105.7 (*)    MCHC 29.7 (*)    Platelets 147 (*)    Neutro Abs 23.2 (*)    All other components within normal limits  PROTIME-INR - Abnormal; Notable for the following components:   Prothrombin Time 22.5 (*)    INR 2.0 (*)    All other components within normal limits  COMPREHENSIVE METABOLIC PANEL - Abnormal; Notable for the following components:   Potassium 2.7 (*)    Chloride 114 (*)    CO2 16 (*)    Glucose, Bld 171 (*)    Creatinine, Ser 2.04 (*)    Calcium 6.2 (*)    Total Protein 4.3 (*)    Albumin 1.7 (*)    AST 63 (*)    GFR, Estimated 33 (*)    All other components within normal limits  BRAIN NATRIURETIC PEPTIDE - Abnormal; Notable for the following components:   B Natriuretic Peptide 516.3 (*)    All other  components within normal limits  GLUCOSE, CAPILLARY - Abnormal; Notable for the following components:   Glucose-Capillary 27 (*)    All other components within normal limits  CBG MONITORING, ED - Abnormal; Notable for the following components:   Glucose-Capillary 53 (*)    All other components within normal limits  I-STAT CHEM 8, ED - Abnormal; Notable for the following components:   Sodium 148 (*)    Potassium 2.1 (*)    Chloride 112 (*)    Creatinine, Ser 1.90 (*)    Glucose, Bld 162 (*)    Calcium, Ion 0.84 (*)    TCO2 17 (*)    Hemoglobin 9.9 (*)    HCT 29.0 (*)    All other components within normal limits  I-STAT CG4 LACTIC ACID, ED - Abnormal; Notable for the following components:   Lactic Acid, Venous 7.1 (*)    All other components within normal limits  I-STAT VENOUS BLOOD GAS, ED - Abnormal; Notable for the following components:   pH, Ven 7.166 (*)    pO2, Ven 29 (*)    Bicarbonate 16.2 (*)    TCO2 18 (*)    Acid-base deficit 12.0 (*)    Sodium 149 (*)    Potassium 2.1 (*)    Calcium, Ion 0.84 (*)    HCT 30.0 (*)    Hemoglobin 10.2 (*)    All other components within normal limits  CBG MONITORING, ED - Abnormal; Notable for the following components:   Glucose-Capillary 158 (*)    All other components within normal limits  TROPONIN I (HIGH SENSITIVITY) - Abnormal; Notable for the following components:   Troponin I (High Sensitivity) 336 (*)    All other components within normal limits  RESP PANEL BY RT-PCR (RSV, FLU A&B, COVID)  RVPGX2  CULTURE, BLOOD (ROUTINE X 2)  CULTURE, BLOOD (ROUTINE X 2)  ETHANOL  GLUCOSE, CAPILLARY  URINALYSIS, W/ REFLEX TO CULTURE (INFECTION SUSPECTED)  RAPID URINE DRUG SCREEN, HOSP PERFORMED  MAGNESIUM  CBC  CREATININE, SERUM  BASIC METABOLIC PANEL  BLOOD GAS, ARTERIAL  I-STAT CG4 LACTIC ACID, ED  I-STAT CG4 LACTIC ACID, ED  I-STAT CG4 LACTIC ACID, ED  TYPE AND SCREEN  ABO/RH  TROPONIN I (HIGH SENSITIVITY)    EKG EKG  Interpretation Date/Time:  Tuesday June 30 2023 13:33:48 EST Ventricular Rate:  127 PR Interval:    QRS Duration:  191 QT  Interval:  394 QTC Calculation: 625 R Axis:   66  Text Interpretation: Extreme tachycardia with wide complex, no further rhythm analysis attempted probably afib Confirmed by Pricilla Loveless [phone]) on 06/13/2023 3:39:47 PM  Radiology No results found.  Procedures .Critical Care  Performed by: Pricilla Loveless, MD Authorized by: Pricilla Loveless, MD   Critical care provider statement:    Critical care time (minutes):  60   Critical care time was exclusive of:  Separately billable procedures and treating other patients   Critical care was necessary to treat or prevent imminent or life-threatening deterioration of the following conditions:  Respiratory failure, shock and CNS failure or compromise   Critical care was time spent personally by me on the following activities:  Development of treatment plan with patient or surrogate, discussions with consultants, evaluation of patient's response to treatment, examination of patient, ordering and review of laboratory studies, ordering and review of radiographic studies, ordering and performing treatments and interventions, pulse oximetry, re-evaluation of patient's condition and review of old charts Procedure Name: Intubation Date/Time: 06/20/2023 3:38 PM  Performed by: Pricilla Loveless, MDVentilation: Mask ventilation without difficulty Laryngoscope Size: Glidescope and 3 Grade View: Grade I Tube size: 7.5 mm Number of attempts: 1 Airway Equipment and Method: Video-laryngoscopy Placement Confirmation: ETT inserted through vocal cords under direct vision, CO2 detector and Breath sounds checked- equal and bilateral Tube secured with: ETT holder Dental Injury: Teeth and Oropharynx as per pre-operative assessment         Medications Ordered in ED Medications  0.9 %  sodium chloride infusion (has no administration  in time range)  EPINEPHrine (ADRENALIN) 5 mg in NS 250 mL (0.02 mg/mL) premix infusion (5 mcg/min Intravenous New Bag/Given 06/24/2023 1256)  metroNIDAZOLE (FLAGYL) IVPB 500 mg (has no administration in time range)  ceFEPIme (MAXIPIME) 2 g in sodium chloride 0.9 % 100 mL IVPB (has no administration in time range)  vancomycin (VANCOCIN) 2,500 mg in sodium chloride 0.9 % 500 mL IVPB (has no administration in time range)  potassium chloride 10 mEq in 100 mL IVPB (has no administration in time range)  potassium chloride (KLOR-CON) packet 60 mEq (has no administration in time range)  amiodarone (NEXTERONE PREMIX) 360-4.14 MG/200ML-% (1.8 mg/mL) IV infusion (has no administration in time range)    Followed by  amiodarone (NEXTERONE PREMIX) 360-4.14 MG/200ML-% (1.8 mg/mL) IV infusion (has no administration in time range)  magnesium sulfate IVPB 2 g 50 mL (has no administration in time range)  polyethylene glycol (MIRALAX / GLYCOLAX) packet 17 g (has no administration in time range)  famotidine (PEPCID) tablet 20 mg (has no administration in time range)  pantoprazole (PROTONIX) injection 40 mg (has no administration in time range)  vasopressin (PITRESSIN) 20 Units in 100 mL (0.2 unit/mL) infusion-*FOR SHOCK* (0.03 Units/min Intravenous New Bag/Given 06/29/2023 1359)  docusate (COLACE) 50 MG/5ML liquid 100 mg (has no administration in time range)  vancomycin (VANCOCIN) IVPB 1000 mg/200 mL premix (has no administration in time range)  ceFEPIme (MAXIPIME) 2 g in sodium chloride 0.9 % 100 mL IVPB (has no administration in time range)  EPINEPHrine (ADRENALIN) 1 MG/10ML injection (1 mg Intravenous Given 06/28/2023 1244)  dextrose 50 % solution (has no administration in time range)  dextrose 50 % solution (1 ampule Intravenous Given 06/27/2023 1243)  norepinephrine (LEVOPHED) 4mg  in (0.016 mg/mL) premix infusion (has no administration in time range)  atropine 1 MG/10ML injection (1 mg Intravenous Given 07/03/2023  1255)    ED Course/ Medical Decision Making/  A&P                                 Medical Decision Making Amount and/or Complexity of Data Reviewed Labs: ordered.    Details: Elevated lactate.  Metabolic acidosis as well as significant hypokalemia and AKI. Radiology: ordered. ECG/medicine tests: ordered and independent interpretation performed.  Risk Prescription drug management. Decision regarding hospitalization.   Patient presents with altered mental status.  He COVID as he was being transferred over from the EMS stretcher to our stretcher.  CPR was started, he was given epinephrine, and he was intubated.  After another couple rounds of CPR he had ROSC.  Had an okay blood pressure and was put on Levophed.  Then developed significant bradycardia and higher pressor requirements and was put on epinephrine after being given atropine.  Ultimately developed a dysrhythmia that was hard to tell if it was ventricular versus A-fib given his wider complex EKG at baseline.  He was given amiodarone though no bolus.  Found to have significant hypokalemia on i-STAT testing.  Was given IV potassium and critical care was consulted as well.  He has continued to deteriorate while in the emergency department.  Otherwise, I tried to call the emergency contact, the daughter, but she did not answer.  He will be admitted to the intensive care unit under critical condition.        Final Clinical Impression(s) / ED Diagnoses Final diagnoses:  Cardiac arrest (HCC)  Acute respiratory failure with hypoxia (HCC)  Shock (HCC)  Hypokalemia    Rx / DC Orders ED Discharge Orders     None         Pricilla Loveless, MD 06/08/2023 1541

## 2023-07-05 NOTE — ED Triage Notes (Addendum)
Patient arrived by [facility] from home reported as AMS, no radial pulses, tachycardia and hypotension. Reported only responding to pain.  Patient noted with agonal respirations as moved to stretcher and immediately noted no pulses and apneic. RT called and CPR initiated.  Pharmacy at bedside

## 2023-07-05 NOTE — Progress Notes (Signed)
Pharmacy Antibiotic Note  Shane Diaz is a 77 y.o. male admitted on 06/27/2023 with  septic shock . Labs notable for lactic acid 7.1, WBC 24.4, and reduced renal function with Scr 2.04 with associated eCrCl of 43.8. Pharmacy has been consulted for vanc/cefepime dosing.  Plan: Vancomycin 1g every 24h  (estimated AUC/MIC 485 with associated trough of 14.1) Cefepime 2g q12h Monitor renal function, may need to move to spot dosing if renal function worsens F/u source of infection, duration of therapy, micro data    Height: 5\' 8"  (172.7 cm) Weight: 135 kg (297 lb 9.9 oz) (estimated) IBW/kg (Calculated) : 68.4  No data recorded.  Recent Labs  Lab 07/03/2023 1256 07/03/2023 1300 06/16/2023 1301  WBC 24.4*  --   --   CREATININE 2.04* 1.90*  --   LATICACIDVEN  --   --  7.1*    Estimated Creatinine Clearance: 43.8 mL/min (A) (by C-G formula based on SCr of 1.9 mg/dL (H)).    Allergies  Allergen Reactions   Diltiazem     rash   Amiodarone Rash    Amiodarone DC'd 02/07/2023 due to concern causing hypothyroidism.     Antimicrobials this admission: Cefepime 11/26 >> Vanc 11/26 >> Metronidazole 11/26  Microbiology results: 11/26 BCx:   Thank you for allowing pharmacy to be a part of this patient's care.  Rutherford Nail, PharmD PGY2 Critical Care Pharmacy Resident 06/27/2023 2:19 PM

## 2023-07-05 NOTE — Procedures (Signed)
Arterial Catheter Insertion Procedure Note  Shane Diaz  [id number]  [birth year]  Date:06/21/2023  Time:3:35 PM    Provider Performing: Lynnell Catalan    Procedure: Insertion of Arterial Line (40347) with US guidance (42595)   Indication(s) Blood pressure monitoring and/or need for frequent ABGs  Consent Risks of the procedure as well as the alternatives and risks of each were explained to the patient and/or caregiver.  Consent for the procedure was obtained and is signed in the bedside chart  Anesthesia None   Time Out Verified patient identification, verified procedure, site/side was marked, verified correct patient position, special equipment/implants available, medications/allergies/relevant history reviewed, required imaging and test results available.   Sterile Technique Maximal sterile technique including full sterile barrier drape, hand hygiene, sterile gown, sterile gloves, mask, hair covering, sterile ultrasound probe cover (if used).   Procedure Description Area of catheter insertion was cleaned with chlorhexidine and draped in sterile fashion. With real-time ultrasound guidance an arterial catheter was placed into the left femoral artery.  Appropriate arterial tracings confirmed on monitor.     Complications/Tolerance None; patient tolerated the procedure well.   EBL Minimal   Specimen(s) None  Lynnell Catalan, MD [facility] ICU Physician [facility] Critical Care  Pager: [phone] Or Epic Secure Chat After hours: [phone].  06/09/2023, 3:36 PM

## 2023-07-05 NOTE — H&P (Signed)
NAME:  Shane Diaz, MRN:  [id number], DOB:  Mar 05, 1946, LOS: 0 ADMISSION DATE:  06/19/2023, CONSULTATION DATE:  06/14/2023 REFERRING MD:  Criss Alvine - ED [facility], CHIEF COMPLAINT:  06/09/2023   History of Present Illness:  77 year old man who was brought in by [facility]. Corollary history available only from chart Apparently initial call was for altered mental status, but his breathing worsened on route and the patient became apneic and lost pulses on arrival in ED and receive of CPR prior to ROSC  Remains unresponsive, intubated and on escalating vasopressor doses.    From chart review it appears that the patient has been bedbound at least since his last admission in July when he presented with septic shock secondary to E. coli bacteremia and acute cholecystitis with an ESBL positive organism.  He is known to have acute on chronic respiratory failure with hypoxemia and hypercapnia related to obesity hypoventilation syndrome, chronic atrial fibrillation, chronic venous stasis, chronic diastolic heart failure.  Pertinent  Medical History   Past Medical History:  Diagnosis Date   Acute diastolic heart failure (HCC) 08/03/2020   Arthritis    Diverticulosis 05/2015   Mild, noted on colonosocpy   Fatty liver 05/14/2015   noted on [place] ABD   History of colon polyps 05/2014   sessile in ascending colon, pedunculated in sigmoid colon, diverticulosis   Hyperlipidemia    pt denies   Hypertension    not currently taking medications per MD   OSA (obstructive sleep apnea) 08/03/2020   Renal cyst 05/2014   Small, Left, noted on [place] ABD   Umbilical hernia    Varicose veins    Past Surgical History:  Procedure Laterality Date   APPENDECTOMY     childhood   BIOPSY  07/11/2020   Procedure: BIOPSY;  Surgeon: Meryl Dare, MD;  Location: [facility] ENDOSCOPY;  Service: Endoscopy;;   CARDIOVERSION N/A 07/13/2020   Procedure: CARDIOVERSION;  Surgeon: Wendall Stade, MD;  Location: [facility] ENDOSCOPY;  Service:  Cardiovascular;  Laterality: N/A;   CARDIOVERSION N/A 08/02/2020   Procedure: CARDIOVERSION;  Surgeon: Christell Constant, MD;  Location: [facility] ENDOSCOPY;  Service: Cardiovascular;  Laterality: N/A;   CARDIOVERSION N/A 12/12/2021   Procedure: CARDIOVERSION;  Surgeon: Pricilla Riffle, MD;  Location: [facility] ENDOSCOPY;  Service: Cardiovascular;  Laterality: N/A;   COLONOSCOPY W/ POLYPECTOMY  05/23/2014   COLONOSCOPY WITH PROPOFOL N/A 07/11/2020   Procedure: COLONOSCOPY WITH PROPOFOL;  Surgeon: Meryl Dare, MD;  Location: [facility] ENDOSCOPY;  Service: Endoscopy;  Laterality: N/A;   ESOPHAGOGASTRODUODENOSCOPY (EGD) WITH PROPOFOL N/A 07/11/2020   Procedure: ESOPHAGOGASTRODUODENOSCOPY (EGD) WITH PROPOFOL;  Surgeon: Meryl Dare, MD;  Location: [facility] ENDOSCOPY;  Service: Endoscopy;  Laterality: N/A;   IR BILIARY DRAIN PLACEMENT WITH CHOLANGIOGRAM  02/23/2023   IR EXCHANGE BILIARY DRAIN  04/21/2023   IR EXCHANGE BILIARY DRAIN  06/02/2023   TEE WITHOUT CARDIOVERSION N/A 07/13/2020   Procedure: TRANSESOPHAGEAL ECHOCARDIOGRAM (TEE);  Surgeon: Wendall Stade, MD;  Location: [facility] ENDOSCOPY;  Service: Cardiovascular;  Laterality: N/A;   UMBILICAL HERNIA REPAIR N/A 04/27/2018   Procedure: REPAIR UMBILICAL HERNIA ERAS PATHWAY;  Surgeon: Ovidio Kin, MD;  Location: [facility] ORS;  Service: General;  Laterality: N/A;   varicose veins     WRIST SURGERY     No current facility-administered medications on file prior to encounter.   Current Outpatient Medications on File Prior to Encounter  Medication Sig Dispense Refill   acetaminophen (TYLENOL) 325 MG tablet Take 2 tablets (650 mg  total) by mouth every 4 (four) hours as needed for fever, headache or mild pain.     apixaban (ELIQUIS) 5 MG TABS tablet Take 1 tablet (5 mg total) by mouth 2 (two) times daily. 180 tablet 3   bisacodyl 5 MG EC tablet Take 10 mg by mouth daily as needed for moderate constipation.     bismuth subsalicylate (PEPTO BISMOL) 262 MG/15ML suspension Take  30 mLs by mouth as needed for diarrhea or loose stools or indigestion.     bumetanide (BUMEX) 2 MG tablet TAKE 2 TABLETS BY MOUTH TWICE A DAY 360 tablet 2   levothyroxine (SYNTHROID) 100 MCG tablet Take 1 tablet (100 mcg total) by mouth daily at 6 (six) AM. 30 tablet 0   nitroGLYCERIN (NITROSTAT) 0.4 MG SL tablet Place 1 tablet (0.4 mg total) under the tongue every 5 (five) minutes x 3 doses as needed for chest pain. 25 tablet 4   Significant Hospital Events: Including procedures, antibiotic start and stop dates in addition to other pertinent events   11/26 -admitted via ED.  Arrested twice during ED resuscitation.  Interim History / Subjective:  Patient on vasoactive infusions, intubated.  Persistently hypotensive.  Objective   Height 5\' 8"  (1.727 m).    Vent Mode: PRVC FiO2 (%):  [100 %] 100 % Set Rate:  [20 bmp] 20 bmp Vt Set:  [550 mL] 550 mL PEEP:  [5 cmH20] 5 cmH20 Plateau Pressure:  [20 cmH20] 20 cmH20  No intake or output data in the 24 hours ending 06/05/2023 1401 There were no vitals filed for this visit.  Examination: General: Morbidly obese man.  Mottled.  Respiratory distress. HENT: ET tube OG tube in place. Lungs: Clear. Cardiovascular: Heart sounds are distant Abdomen: Protuberant abdomen with right upper quadrant drain in place.  Minimal effluent. Extremities: Bilateral mottled Neuro: Unresponsive to stimulation. GU: Foley catheter in place with no urine.  Ancillary tests:  VBG 7.166/pCO2 44/pO2 29 Sodium 148, potassium 2.1, creatinine 1.9 BNP 516 Leukocytosis 24.4 hemoglobin 7.7 INR 2.0  Assessment & Plan:   Undifferentiated circulatory shock likely septic from unknown source although biliary most likely. Acute hypoxic respiratory failure Acute kidney injury with anuria Morbid obesity Repeated asystolic cardiac arrest Atrial fibrillation with rapid ventricular response  Plan:  -Titrate norepinephrine, epinephrine, vasopressin to keep MAP greater than  65 -Fluid resuscitation. -Full mechanical ventilatory support. -Push bicarb, start bicarbonate infusion. -Remains unresponsive.  No sedation.  Goals of care: Joneen Roach, NP has reached to patient's family who has stated that the patient did not want to be placed on life support.  We have encouraged him to come into hospital as we suspect that he will decompensate further.  Will discuss a transition to comfort care consistent with his wishes once they arrive at his visits unlikely that he will survive given his significant comorbidities and the severity of his shock state.  Best Practice (right click and "Reselect all SmartList Selections" daily)   Diet/type: NPO DVT prophylaxis: SCDs Start: 06/07/2023 1356   Pressure ulcer(s): present on admission  GI prophylaxis: PPI Lines: Central line and Arterial Line Foley:  Yes, and it is still needed Code Status:  full code Last date of multidisciplinary goals of care discussion [family updated over the phone.]   CRITICAL CARE Performed by: Lynnell Catalan   Total critical care time: 45 minutes  Critical care time was exclusive of separately billable procedures and treating other patients.  Critical care was necessary to treat or prevent imminent or  life-threatening deterioration.  Critical care was time spent personally by me on the following activities: development of treatment plan with patient and/or surrogate as well as nursing, discussions with consultants, evaluation of patient's response to treatment, examination of patient, obtaining history from patient or surrogate, ordering and performing treatments and interventions, ordering and review of laboratory studies, ordering and review of radiographic studies, pulse oximetry, re-evaluation of patient's condition and participation in multidisciplinary rounds.  Lynnell Catalan, MD [facility] ICU Physician [facility] Critical Care  Pager: [phone] Mobile: [phone] After hours:  [phone].

## 2023-07-05 NOTE — Progress Notes (Signed)
ED Pharmacy Antibiotic Sign Off An antibiotic consult was received from an ED provider for vancomycin and cefepime per pharmacy dosing for sepsis. A chart review was completed to assess appropriateness.   The following one time order(s) were placed:  Vancomycin 2500mg  x1  Cefepime 2g x1   Further antibiotic and/or antibiotic pharmacy consults should be ordered by the admitting provider if indicated.   Thank you for allowing pharmacy to be a part of this patient's care.   Estill Batten, PharmD, BCCCP  Clinical Pharmacist 06/06/2023 1:02 PM

## 2023-07-05 NOTE — Procedures (Signed)
Central Venous Catheter Insertion Procedure Note  Mccarthy Goldwasser  [id number]  03-22-1946  Date:06/24/2023  Time:3:34 PM   Provider Performing:Verlon Pischke   Procedure: Insertion of Non-tunneled Central Venous [phone]) with US guidance (44010)   Indication(s) Medication administration and Difficult access  Consent Risks of the procedure as well as the alternatives and risks of each were explained to the patient and/or caregiver.  Consent for the procedure was obtained and is signed in the bedside chart  Anesthesia Topical only with 1% lidocaine   Timeout Verified patient identification, verified procedure, site/side was marked, verified correct patient position, special equipment/implants available, medications/allergies/relevant history reviewed, required imaging and test results available.  Sterile Technique Maximal sterile technique including full sterile barrier drape, hand hygiene, sterile gown, sterile gloves, mask, hair covering, sterile ultrasound probe cover (if used).  Procedure Description Area of catheter insertion was cleaned with chlorhexidine and draped in sterile fashion.  With real-time ultrasound guidance a central venous catheter was placed into the left femoral vein. Nonpulsatile blood flow and easy flushing noted in all ports.  The catheter was sutured in place and sterile dressing applied.  Complications/Tolerance None; patient tolerated the procedure well. Chest X-ray is ordered to verify placement for internal jugular or subclavian cannulation.   Chest x-ray is not ordered for femoral cannulation.  EBL Minimal  Specimen(s) None   Lynnell Catalan, MD [facility] ICU Physician [facility] Critical Care  Pager: [phone] Or Epic Secure Chat After hours: [phone].  06/07/2023, 3:34 PM

## 2023-07-05 NOTE — Progress Notes (Signed)
RT transported pt on ventilator from ED19 to 2M11 without any issues. RN at bedside.

## 2023-07-05 NOTE — Plan of Care (Signed)
  Problem: Pain Management: Goal: General experience of comfort will improve 07/01/2023 1703 by Reatha Armour, RN Outcome: Progressing 07/02/2023 1703 by Reatha Armour, RN Outcome: Not Progressing   Problem: Skin Integrity: Goal: Risk for impaired skin integrity will decrease 06/18/2023 1703 by Reatha Armour, RN Outcome: Progressing 06/17/2023 1703 by Reatha Armour, RN Outcome: Not Progressing   Problem: Education: Goal: Knowledge of General Education information will improve Description: Including pain rating scale, medication(s)/side effects and non-pharmacologic comfort measures 06/22/2023 1703 by Reatha Armour, RN Outcome: Not Progressing 06/27/2023 1703 by Reatha Armour, RN Outcome: Not Progressing   Problem: Health Behavior/Discharge Planning: Goal: Ability to manage health-related needs will improve 07/01/2023 1703 by Reatha Armour, RN Outcome: Not Progressing 06/18/2023 1703 by Reatha Armour, RN Outcome: Not Progressing   Problem: Clinical Measurements: Goal: Ability to maintain clinical measurements within normal limits will improve 06/06/2023 1703 by Reatha Armour, RN Outcome: Not Progressing 06/23/2023 1703 by Reatha Armour, RN Outcome: Not Progressing Goal: Will remain free from infection 06/18/2023 1703 by Reatha Armour, RN Outcome: Not Progressing 06/25/2023 1703 by Reatha Armour, RN Outcome: Not Progressing Goal: Diagnostic test results will improve 07/04/2023 1703 by Reatha Armour, RN Outcome: Not Progressing 06/27/2023 1703 by Reatha Armour, RN Outcome: Not Progressing Goal: Respiratory complications will improve 06/23/2023 1703 by Reatha Armour, RN Outcome: Not Progressing 06/25/2023 1703 by Reatha Armour, RN Outcome: Not Progressing Goal: Cardiovascular complication will be  avoided 06/10/2023 1703 by Reatha Armour, RN Outcome: Not Progressing 06/11/2023 1703 by Reatha Armour, RN Outcome: Not Progressing   Problem: Activity: Goal: Risk for activity intolerance will decrease 06/25/2023 1703 by Reatha Armour, RN Outcome: Not Progressing 06/15/2023 1703 by Reatha Armour, RN Outcome: Not Progressing   Problem: Nutrition: Goal: Adequate nutrition will be maintained 06/24/2023 1703 by Reatha Armour, RN Outcome: Not Progressing 06/21/2023 1703 by Reatha Armour, RN Outcome: Not Progressing   Problem: Coping: Goal: Level of anxiety will decrease 06/18/2023 1703 by Reatha Armour, RN Outcome: Not Progressing 06/28/2023 1703 by Reatha Armour, RN Outcome: Not Progressing   Problem: Elimination: Goal: Will not experience complications related to bowel motility 06/17/2023 1703 by Reatha Armour, RN Outcome: Not Progressing 06/08/2023 1703 by Reatha Armour, RN Outcome: Not Progressing Goal: Will not experience complications related to urinary retention 06/06/2023 1703 by Reatha Armour, RN Outcome: Not Progressing 06/15/2023 1703 by Reatha Armour, RN Outcome: Not Progressing   Problem: Safety: Goal: Ability to remain free from injury will improve 06/07/2023 1703 by Reatha Armour, RN Outcome: Not Progressing 07/02/2023 1703 by Reatha Armour, RN Outcome: Not Progressing   Problem: Education: Goal: Ability to manage disease process will improve 06/22/2023 1703 by Reatha Armour, RN Outcome: Not Progressing 06/23/2023 1703 by Reatha Armour, RN Outcome: Not Progressing   Problem: Cardiac: Goal: Ability to achieve and maintain adequate cardiopulmonary perfusion will improve 06/17/2023 1703 by Reatha Armour, RN Outcome: Not Progressing 06/17/2023 1703 by Reatha Armour,  RN Outcome: Not Progressing   Problem: Neurologic: Goal: Promote progressive neurologic recovery 06/06/2023 1703 by Reatha Armour, RN Outcome: Not Progressing 06/24/2023 1703 by Reatha Armour, RN Outcome: Not Progressing   Problem: Skin Integrity: Goal: Risk for impaired skin integrity will be minimized. 06/24/2023 1703 by Reatha Armour, RN Outcome: Not Progressing 06/27/2023 1703 by Reatha Armour, RN Outcome: Not Progressing

## 2023-07-05 DEATH — deceased

## 2023-07-27 ENCOUNTER — Other Ambulatory Visit (HOSPITAL_COMMUNITY): Payer: Medicare Other
